# Patient Record
Sex: Female | Born: 1945 | Race: Black or African American | Hispanic: No | Smoking: Former smoker
Health system: Southern US, Community
[De-identification: ages and names within clinical notes are randomized; demographics above are authoritative.]

## PROBLEM LIST (undated history)

## (undated) DIAGNOSIS — Z9889 Other specified postprocedural states: Secondary | ICD-10-CM

## (undated) DIAGNOSIS — R112 Nausea with vomiting, unspecified: Secondary | ICD-10-CM

## (undated) DIAGNOSIS — K5792 Diverticulitis of intestine, part unspecified, without perforation or abscess without bleeding: Secondary | ICD-10-CM

## (undated) DIAGNOSIS — I82409 Acute embolism and thrombosis of unspecified deep veins of unspecified lower extremity: Secondary | ICD-10-CM

## (undated) DIAGNOSIS — J40 Bronchitis, not specified as acute or chronic: Secondary | ICD-10-CM

## (undated) DIAGNOSIS — I2699 Other pulmonary embolism without acute cor pulmonale: Secondary | ICD-10-CM

## (undated) DIAGNOSIS — J45909 Unspecified asthma, uncomplicated: Secondary | ICD-10-CM

## (undated) DIAGNOSIS — Z87442 Personal history of urinary calculi: Secondary | ICD-10-CM

## (undated) DIAGNOSIS — E119 Type 2 diabetes mellitus without complications: Secondary | ICD-10-CM

## (undated) DIAGNOSIS — I10 Essential (primary) hypertension: Secondary | ICD-10-CM

## (undated) DIAGNOSIS — R42 Dizziness and giddiness: Secondary | ICD-10-CM

## (undated) DIAGNOSIS — Z7901 Long term (current) use of anticoagulants: Secondary | ICD-10-CM

## (undated) DIAGNOSIS — I509 Heart failure, unspecified: Secondary | ICD-10-CM

## (undated) DIAGNOSIS — M1990 Unspecified osteoarthritis, unspecified site: Secondary | ICD-10-CM

## (undated) DIAGNOSIS — I951 Orthostatic hypotension: Secondary | ICD-10-CM

## (undated) DIAGNOSIS — K219 Gastro-esophageal reflux disease without esophagitis: Secondary | ICD-10-CM

## (undated) DIAGNOSIS — R791 Abnormal coagulation profile: Secondary | ICD-10-CM

## (undated) HISTORY — PX: ABDOMINAL HYSTERECTOMY: SHX81

## (undated) HISTORY — DX: Orthostatic hypotension: I95.1

## (undated) HISTORY — DX: Abnormal coagulation profile: R79.1

## (undated) HISTORY — DX: Other pulmonary embolism without acute cor pulmonale: I26.99

## (undated) HISTORY — DX: Acute embolism and thrombosis of unspecified deep veins of unspecified lower extremity: I82.409

## (undated) HISTORY — DX: Type 2 diabetes mellitus without complications: E11.9

## (undated) HISTORY — DX: Long term (current) use of anticoagulants: Z79.01

---

## 1997-10-03 ENCOUNTER — Emergency Department (HOSPITAL_COMMUNITY): Admission: EM | Admit: 1997-10-03 | Discharge: 1997-10-03 | Payer: Self-pay | Admitting: Emergency Medicine

## 2000-03-09 ENCOUNTER — Encounter: Payer: Self-pay | Admitting: Emergency Medicine

## 2000-03-09 ENCOUNTER — Inpatient Hospital Stay (HOSPITAL_COMMUNITY): Admission: EM | Admit: 2000-03-09 | Discharge: 2000-03-17 | Payer: Self-pay | Admitting: Emergency Medicine

## 2000-03-11 ENCOUNTER — Encounter: Payer: Self-pay | Admitting: Internal Medicine

## 2000-08-08 ENCOUNTER — Emergency Department (HOSPITAL_COMMUNITY): Admission: EM | Admit: 2000-08-08 | Discharge: 2000-08-08 | Payer: Self-pay | Admitting: Emergency Medicine

## 2000-08-08 ENCOUNTER — Encounter: Payer: Self-pay | Admitting: Emergency Medicine

## 2000-12-19 ENCOUNTER — Emergency Department (HOSPITAL_COMMUNITY): Admission: EM | Admit: 2000-12-19 | Discharge: 2000-12-20 | Payer: Self-pay | Admitting: Emergency Medicine

## 2000-12-19 ENCOUNTER — Encounter: Payer: Self-pay | Admitting: Emergency Medicine

## 2002-01-27 ENCOUNTER — Encounter: Payer: Self-pay | Admitting: Emergency Medicine

## 2002-01-27 ENCOUNTER — Emergency Department (HOSPITAL_COMMUNITY): Admission: EM | Admit: 2002-01-27 | Discharge: 2002-01-27 | Payer: Self-pay | Admitting: Emergency Medicine

## 2003-02-25 ENCOUNTER — Emergency Department (HOSPITAL_COMMUNITY): Admission: EM | Admit: 2003-02-25 | Discharge: 2003-02-26 | Payer: Self-pay | Admitting: Emergency Medicine

## 2003-04-16 ENCOUNTER — Other Ambulatory Visit: Admission: RE | Admit: 2003-04-16 | Discharge: 2003-04-16 | Payer: Self-pay | Admitting: Family Medicine

## 2003-04-28 ENCOUNTER — Ambulatory Visit (HOSPITAL_COMMUNITY): Admission: RE | Admit: 2003-04-28 | Discharge: 2003-04-28 | Payer: Self-pay | Admitting: Family Medicine

## 2003-05-03 ENCOUNTER — Emergency Department (HOSPITAL_COMMUNITY): Admission: EM | Admit: 2003-05-03 | Discharge: 2003-05-03 | Payer: Self-pay | Admitting: Emergency Medicine

## 2003-06-03 ENCOUNTER — Encounter: Admission: RE | Admit: 2003-06-03 | Discharge: 2003-06-03 | Payer: Self-pay | Admitting: Family Medicine

## 2003-06-05 ENCOUNTER — Encounter: Admission: RE | Admit: 2003-06-05 | Discharge: 2003-06-05 | Payer: Self-pay | Admitting: Family Medicine

## 2003-06-05 ENCOUNTER — Encounter (INDEPENDENT_AMBULATORY_CARE_PROVIDER_SITE_OTHER): Payer: Self-pay | Admitting: Specialist

## 2003-09-22 ENCOUNTER — Ambulatory Visit: Payer: Self-pay | Admitting: Family Medicine

## 2003-10-21 ENCOUNTER — Ambulatory Visit: Payer: Self-pay | Admitting: Internal Medicine

## 2003-11-19 ENCOUNTER — Ambulatory Visit: Payer: Self-pay | Admitting: Family Medicine

## 2003-12-22 ENCOUNTER — Ambulatory Visit: Payer: Self-pay | Admitting: Family Medicine

## 2003-12-24 ENCOUNTER — Ambulatory Visit: Payer: Self-pay | Admitting: Family Medicine

## 2004-01-19 ENCOUNTER — Ambulatory Visit: Payer: Self-pay | Admitting: Family Medicine

## 2004-02-16 ENCOUNTER — Ambulatory Visit: Payer: Self-pay | Admitting: Family Medicine

## 2004-03-16 ENCOUNTER — Ambulatory Visit: Payer: Self-pay | Admitting: Family Medicine

## 2004-04-13 ENCOUNTER — Ambulatory Visit: Payer: Self-pay | Admitting: Family Medicine

## 2004-04-21 ENCOUNTER — Ambulatory Visit: Payer: Self-pay | Admitting: Family Medicine

## 2004-05-13 ENCOUNTER — Ambulatory Visit: Payer: Self-pay | Admitting: Family Medicine

## 2004-06-14 ENCOUNTER — Ambulatory Visit: Payer: Self-pay | Admitting: Family Medicine

## 2004-07-07 ENCOUNTER — Encounter: Admission: RE | Admit: 2004-07-07 | Discharge: 2004-07-07 | Payer: Self-pay | Admitting: Family Medicine

## 2004-07-12 ENCOUNTER — Ambulatory Visit: Payer: Self-pay | Admitting: Family Medicine

## 2004-08-16 ENCOUNTER — Ambulatory Visit: Payer: Self-pay | Admitting: Family Medicine

## 2004-09-15 ENCOUNTER — Ambulatory Visit: Payer: Self-pay | Admitting: Family Medicine

## 2004-10-13 ENCOUNTER — Ambulatory Visit: Payer: Self-pay | Admitting: Family Medicine

## 2004-11-10 ENCOUNTER — Ambulatory Visit: Payer: Self-pay | Admitting: Family Medicine

## 2004-12-08 ENCOUNTER — Ambulatory Visit: Payer: Self-pay | Admitting: Family Medicine

## 2005-01-06 ENCOUNTER — Ambulatory Visit: Payer: Self-pay | Admitting: Family Medicine

## 2005-02-03 ENCOUNTER — Ambulatory Visit: Payer: Self-pay | Admitting: Internal Medicine

## 2005-03-08 ENCOUNTER — Ambulatory Visit: Payer: Self-pay | Admitting: Family Medicine

## 2005-03-12 ENCOUNTER — Ambulatory Visit: Payer: Self-pay | Admitting: Internal Medicine

## 2005-03-12 ENCOUNTER — Inpatient Hospital Stay (HOSPITAL_COMMUNITY): Admission: EM | Admit: 2005-03-12 | Discharge: 2005-03-15 | Payer: Self-pay | Admitting: Emergency Medicine

## 2005-03-14 ENCOUNTER — Encounter: Payer: Self-pay | Admitting: Cardiology

## 2005-04-05 ENCOUNTER — Ambulatory Visit: Payer: Self-pay | Admitting: Family Medicine

## 2005-04-06 ENCOUNTER — Ambulatory Visit: Payer: Self-pay | Admitting: Family Medicine

## 2005-05-09 ENCOUNTER — Ambulatory Visit: Payer: Self-pay | Admitting: Nurse Practitioner

## 2005-05-30 ENCOUNTER — Ambulatory Visit: Payer: Self-pay | Admitting: Family Medicine

## 2005-06-29 ENCOUNTER — Ambulatory Visit: Payer: Self-pay | Admitting: Internal Medicine

## 2005-07-27 ENCOUNTER — Ambulatory Visit: Payer: Self-pay | Admitting: Internal Medicine

## 2005-08-08 ENCOUNTER — Ambulatory Visit: Payer: Self-pay | Admitting: Family Medicine

## 2005-09-01 ENCOUNTER — Ambulatory Visit: Payer: Self-pay | Admitting: Internal Medicine

## 2005-09-05 ENCOUNTER — Encounter: Admission: RE | Admit: 2005-09-05 | Discharge: 2005-09-05 | Payer: Self-pay | Admitting: Family Medicine

## 2005-09-14 ENCOUNTER — Ambulatory Visit: Payer: Self-pay | Admitting: Internal Medicine

## 2005-09-28 ENCOUNTER — Ambulatory Visit: Payer: Self-pay | Admitting: Family Medicine

## 2005-10-06 ENCOUNTER — Ambulatory Visit: Payer: Self-pay | Admitting: Family Medicine

## 2005-10-31 ENCOUNTER — Ambulatory Visit: Payer: Self-pay | Admitting: Family Medicine

## 2005-11-17 ENCOUNTER — Ambulatory Visit: Payer: Self-pay | Admitting: Family Medicine

## 2005-11-28 ENCOUNTER — Ambulatory Visit: Payer: Self-pay | Admitting: Family Medicine

## 2005-12-26 ENCOUNTER — Ambulatory Visit: Payer: Self-pay | Admitting: Family Medicine

## 2006-01-24 ENCOUNTER — Ambulatory Visit: Payer: Self-pay | Admitting: Family Medicine

## 2006-02-01 ENCOUNTER — Ambulatory Visit: Payer: Self-pay | Admitting: Family Medicine

## 2006-03-08 ENCOUNTER — Ambulatory Visit: Payer: Self-pay | Admitting: Family Medicine

## 2006-03-28 ENCOUNTER — Ambulatory Visit: Payer: Self-pay | Admitting: Family Medicine

## 2006-05-04 ENCOUNTER — Other Ambulatory Visit: Admission: RE | Admit: 2006-05-04 | Discharge: 2006-05-04 | Payer: Self-pay | Admitting: Family Medicine

## 2006-05-04 ENCOUNTER — Ambulatory Visit: Payer: Self-pay | Admitting: Family Medicine

## 2006-05-04 ENCOUNTER — Encounter (INDEPENDENT_AMBULATORY_CARE_PROVIDER_SITE_OTHER): Payer: Self-pay | Admitting: Family Medicine

## 2006-05-04 LAB — CONVERTED CEMR LAB: Pap Smear: NORMAL

## 2006-06-10 ENCOUNTER — Encounter (INDEPENDENT_AMBULATORY_CARE_PROVIDER_SITE_OTHER): Payer: Self-pay | Admitting: Family Medicine

## 2006-06-10 DIAGNOSIS — I8390 Asymptomatic varicose veins of unspecified lower extremity: Secondary | ICD-10-CM | POA: Insufficient documentation

## 2006-06-10 DIAGNOSIS — I10 Essential (primary) hypertension: Secondary | ICD-10-CM

## 2006-06-10 DIAGNOSIS — Z86718 Personal history of other venous thrombosis and embolism: Secondary | ICD-10-CM | POA: Insufficient documentation

## 2006-06-10 DIAGNOSIS — E785 Hyperlipidemia, unspecified: Secondary | ICD-10-CM

## 2006-06-10 DIAGNOSIS — J4540 Moderate persistent asthma, uncomplicated: Secondary | ICD-10-CM | POA: Insufficient documentation

## 2006-06-21 ENCOUNTER — Inpatient Hospital Stay (HOSPITAL_COMMUNITY): Admission: EM | Admit: 2006-06-21 | Discharge: 2006-06-23 | Payer: Self-pay | Admitting: Family Medicine

## 2006-06-21 DIAGNOSIS — J309 Allergic rhinitis, unspecified: Secondary | ICD-10-CM | POA: Insufficient documentation

## 2006-06-22 ENCOUNTER — Ambulatory Visit: Payer: Self-pay | Admitting: Vascular Surgery

## 2006-06-22 ENCOUNTER — Encounter (INDEPENDENT_AMBULATORY_CARE_PROVIDER_SITE_OTHER): Payer: Self-pay | Admitting: Internal Medicine

## 2006-06-26 ENCOUNTER — Ambulatory Visit (HOSPITAL_COMMUNITY): Admission: RE | Admit: 2006-06-26 | Discharge: 2006-06-26 | Payer: Self-pay | Admitting: Internal Medicine

## 2006-06-27 ENCOUNTER — Inpatient Hospital Stay (HOSPITAL_COMMUNITY): Admission: EM | Admit: 2006-06-27 | Discharge: 2006-06-29 | Payer: Self-pay | Admitting: Emergency Medicine

## 2006-07-04 ENCOUNTER — Ambulatory Visit: Payer: Self-pay | Admitting: Family Medicine

## 2006-07-07 ENCOUNTER — Ambulatory Visit: Payer: Self-pay | Admitting: Family Medicine

## 2006-07-17 ENCOUNTER — Ambulatory Visit: Payer: Self-pay | Admitting: Family Medicine

## 2006-07-21 ENCOUNTER — Emergency Department (HOSPITAL_COMMUNITY): Admission: EM | Admit: 2006-07-21 | Discharge: 2006-07-21 | Payer: Self-pay | Admitting: Emergency Medicine

## 2006-08-07 ENCOUNTER — Ambulatory Visit: Payer: Self-pay | Admitting: Family Medicine

## 2006-08-17 ENCOUNTER — Ambulatory Visit: Payer: Self-pay | Admitting: Family Medicine

## 2006-08-22 ENCOUNTER — Emergency Department (HOSPITAL_COMMUNITY): Admission: EM | Admit: 2006-08-22 | Discharge: 2006-08-22 | Payer: Self-pay | Admitting: Emergency Medicine

## 2006-08-23 ENCOUNTER — Ambulatory Visit: Payer: Self-pay | Admitting: Family Medicine

## 2006-09-06 ENCOUNTER — Ambulatory Visit: Payer: Self-pay | Admitting: Family Medicine

## 2006-10-03 ENCOUNTER — Ambulatory Visit: Payer: Self-pay | Admitting: Family Medicine

## 2006-10-10 ENCOUNTER — Encounter: Admission: RE | Admit: 2006-10-10 | Discharge: 2006-10-10 | Payer: Self-pay | Admitting: Family Medicine

## 2006-10-31 ENCOUNTER — Ambulatory Visit: Payer: Self-pay | Admitting: Family Medicine

## 2006-11-14 ENCOUNTER — Ambulatory Visit: Payer: Self-pay | Admitting: Family Medicine

## 2006-12-12 ENCOUNTER — Ambulatory Visit: Payer: Self-pay | Admitting: Family Medicine

## 2006-12-22 ENCOUNTER — Emergency Department (HOSPITAL_COMMUNITY): Admission: EM | Admit: 2006-12-22 | Discharge: 2006-12-22 | Payer: Self-pay | Admitting: Emergency Medicine

## 2006-12-25 ENCOUNTER — Ambulatory Visit: Payer: Self-pay | Admitting: Family Medicine

## 2007-01-09 ENCOUNTER — Ambulatory Visit: Payer: Self-pay | Admitting: Family Medicine

## 2007-02-06 ENCOUNTER — Ambulatory Visit: Payer: Self-pay | Admitting: Family Medicine

## 2007-03-07 ENCOUNTER — Ambulatory Visit: Payer: Self-pay | Admitting: Family Medicine

## 2007-03-22 ENCOUNTER — Ambulatory Visit: Payer: Self-pay | Admitting: Family Medicine

## 2007-03-31 ENCOUNTER — Emergency Department (HOSPITAL_COMMUNITY): Admission: EM | Admit: 2007-03-31 | Discharge: 2007-04-01 | Payer: Self-pay | Admitting: Emergency Medicine

## 2007-04-18 ENCOUNTER — Ambulatory Visit: Payer: Self-pay | Admitting: Family Medicine

## 2007-05-16 ENCOUNTER — Ambulatory Visit: Payer: Self-pay | Admitting: Family Medicine

## 2007-05-28 ENCOUNTER — Ambulatory Visit: Payer: Self-pay | Admitting: Internal Medicine

## 2007-06-11 ENCOUNTER — Ambulatory Visit: Payer: Self-pay | Admitting: Internal Medicine

## 2007-06-11 ENCOUNTER — Encounter (INDEPENDENT_AMBULATORY_CARE_PROVIDER_SITE_OTHER): Payer: Self-pay | Admitting: Family Medicine

## 2007-06-11 LAB — CONVERTED CEMR LAB
ALT: 23 units/L (ref 0–35)
BUN: 16 mg/dL (ref 6–23)
CO2: 28 meq/L (ref 19–32)
Calcium: 9.7 mg/dL (ref 8.4–10.5)
Chloride: 106 meq/L (ref 96–112)
Glucose, Bld: 112 mg/dL — ABNORMAL HIGH (ref 70–99)
HDL: 43 mg/dL (ref >39)
LDL Cholesterol: 133 mg/dL — ABNORMAL HIGH (ref 0–99)
Sodium: 145 meq/L (ref 135–145)
TSH: 0.926 microintl units/mL (ref 0.350–5.50)
Total Protein: 7.4 g/dL (ref 6.0–8.3)
VLDL: 30 mg/dL (ref 0–40)
Vit D, 1,25-Dihydroxy: 16 — ABNORMAL LOW (ref 30–89)

## 2007-07-10 ENCOUNTER — Ambulatory Visit: Payer: Self-pay | Admitting: Family Medicine

## 2007-08-07 ENCOUNTER — Encounter (INDEPENDENT_AMBULATORY_CARE_PROVIDER_SITE_OTHER): Payer: Self-pay | Admitting: Family Medicine

## 2007-08-07 ENCOUNTER — Ambulatory Visit: Payer: Self-pay | Admitting: Internal Medicine

## 2007-08-07 LAB — CONVERTED CEMR LAB
INR: 2.3 — ABNORMAL HIGH (ref 0.0–1.5)
Prothrombin Time: 26.4 s — ABNORMAL HIGH (ref 11.6–15.2)

## 2007-09-05 ENCOUNTER — Ambulatory Visit: Payer: Self-pay | Admitting: Family Medicine

## 2007-09-05 LAB — CONVERTED CEMR LAB
BUN: 19 mg/dL (ref 6–23)
Calcium: 9 mg/dL (ref 8.4–10.5)
Chloride: 106 meq/L (ref 96–112)
Creatinine, Ser: 0.88 mg/dL (ref 0.40–1.20)
Glucose, Bld: 113 mg/dL — ABNORMAL HIGH (ref 70–99)
Potassium: 3.4 meq/L — ABNORMAL LOW (ref 3.5–5.3)
Vit D, 1,25-Dihydroxy: 33 (ref 30–89)

## 2007-10-03 ENCOUNTER — Ambulatory Visit: Payer: Self-pay | Admitting: Family Medicine

## 2007-10-03 LAB — CONVERTED CEMR LAB: Prothrombin Time: 23.8 s — ABNORMAL HIGH (ref 11.6–15.2)

## 2007-10-31 ENCOUNTER — Ambulatory Visit: Payer: Self-pay | Admitting: Family Medicine

## 2007-11-27 ENCOUNTER — Ambulatory Visit: Payer: Self-pay | Admitting: Family Medicine

## 2007-11-27 LAB — CONVERTED CEMR LAB: INR: 1.9 — ABNORMAL HIGH (ref 0.0–1.5)

## 2007-12-18 ENCOUNTER — Ambulatory Visit: Payer: Self-pay | Admitting: Internal Medicine

## 2007-12-18 ENCOUNTER — Ambulatory Visit: Payer: Self-pay | Admitting: Family Medicine

## 2007-12-18 LAB — CONVERTED CEMR LAB: INR: 1.9 — ABNORMAL HIGH (ref 0.0–1.5)

## 2008-01-15 ENCOUNTER — Ambulatory Visit: Payer: Self-pay | Admitting: Family Medicine

## 2008-02-12 ENCOUNTER — Ambulatory Visit: Payer: Self-pay | Admitting: Family Medicine

## 2008-03-21 ENCOUNTER — Ambulatory Visit: Payer: Self-pay | Admitting: Family Medicine

## 2008-04-01 ENCOUNTER — Ambulatory Visit (HOSPITAL_COMMUNITY): Admission: RE | Admit: 2008-04-01 | Discharge: 2008-04-01 | Payer: Self-pay | Admitting: Family Medicine

## 2008-04-22 ENCOUNTER — Ambulatory Visit: Payer: Self-pay | Admitting: Family Medicine

## 2008-05-20 ENCOUNTER — Ambulatory Visit: Payer: Self-pay | Admitting: Family Medicine

## 2008-06-16 ENCOUNTER — Ambulatory Visit: Payer: Self-pay | Admitting: Family Medicine

## 2008-06-16 LAB — CONVERTED CEMR LAB
ALT: 23 units/L (ref 0–35)
AST: 19 units/L (ref 0–37)
Albumin: 3.8 g/dL (ref 3.5–5.2)
Alkaline Phosphatase: 76 units/L (ref 39–117)
CO2: 26 meq/L (ref 19–32)
Calcium: 9.3 mg/dL (ref 8.4–10.5)
Cholesterol: 198 mg/dL (ref 0–200)
Creatinine, Ser: 0.72 mg/dL (ref 0.40–1.20)
Eosinophils Relative: 3 % (ref 0–5)
HCT: 39.9 % (ref 36.0–46.0)
Lymphocytes Relative: 38 % (ref 12–46)
Lymphs Abs: 2.3 10*3/uL (ref 0.7–4.0)
MCHC: 29.3 g/dL — ABNORMAL LOW (ref 30.0–36.0)
Monocytes Absolute: 0.5 10*3/uL (ref 0.1–1.0)
Monocytes Relative: 9 % (ref 3–12)
Neutro Abs: 3 10*3/uL (ref 1.7–7.7)
Platelets: 198 10*3/uL (ref 150–400)
RDW: 15.6 % — ABNORMAL HIGH (ref 11.5–15.5)
Total Bilirubin: 0.2 mg/dL — ABNORMAL LOW (ref 0.3–1.2)
Total Protein: 7.2 g/dL (ref 6.0–8.3)
Triglycerides: 147 mg/dL (ref <150)

## 2008-07-01 ENCOUNTER — Ambulatory Visit: Payer: Self-pay | Admitting: Family Medicine

## 2008-07-15 ENCOUNTER — Ambulatory Visit: Payer: Self-pay | Admitting: Family Medicine

## 2008-07-31 ENCOUNTER — Ambulatory Visit: Payer: Self-pay | Admitting: Family Medicine

## 2008-07-31 ENCOUNTER — Ambulatory Visit: Payer: Self-pay | Admitting: Internal Medicine

## 2008-09-01 ENCOUNTER — Ambulatory Visit: Payer: Self-pay | Admitting: Family Medicine

## 2008-09-26 ENCOUNTER — Telehealth (INDEPENDENT_AMBULATORY_CARE_PROVIDER_SITE_OTHER): Payer: Self-pay | Admitting: *Deleted

## 2008-09-26 ENCOUNTER — Ambulatory Visit: Payer: Self-pay | Admitting: Family Medicine

## 2008-09-29 ENCOUNTER — Ambulatory Visit: Payer: Self-pay | Admitting: Family Medicine

## 2008-10-06 ENCOUNTER — Ambulatory Visit: Payer: Self-pay | Admitting: Family Medicine

## 2008-10-28 ENCOUNTER — Ambulatory Visit: Payer: Self-pay | Admitting: Family Medicine

## 2008-11-25 ENCOUNTER — Ambulatory Visit: Payer: Self-pay | Admitting: Family Medicine

## 2008-12-08 ENCOUNTER — Ambulatory Visit: Payer: Self-pay | Admitting: Internal Medicine

## 2008-12-23 ENCOUNTER — Ambulatory Visit: Payer: Self-pay | Admitting: Family Medicine

## 2009-01-28 ENCOUNTER — Ambulatory Visit: Payer: Self-pay | Admitting: Family Medicine

## 2009-01-28 LAB — CONVERTED CEMR LAB
Calcium: 9.1 mg/dL (ref 8.4–10.5)
Creatinine, Ser: 0.79 mg/dL (ref 0.40–1.20)
Glucose, Bld: 94 mg/dL (ref 70–99)
HDL: 47 mg/dL (ref >39)
LDL Cholesterol: 110 mg/dL — ABNORMAL HIGH (ref 0–99)
Total CHOL/HDL Ratio: 3.9
Triglycerides: 133 mg/dL (ref <150)
VLDL: 27 mg/dL (ref 0–40)

## 2009-02-13 ENCOUNTER — Ambulatory Visit: Payer: Self-pay | Admitting: Family Medicine

## 2009-03-03 ENCOUNTER — Ambulatory Visit: Payer: Self-pay | Admitting: Family Medicine

## 2009-03-09 ENCOUNTER — Ambulatory Visit: Payer: Self-pay | Admitting: Family Medicine

## 2009-03-17 ENCOUNTER — Ambulatory Visit: Payer: Self-pay | Admitting: Family Medicine

## 2009-03-22 ENCOUNTER — Emergency Department (HOSPITAL_COMMUNITY): Admission: EM | Admit: 2009-03-22 | Discharge: 2009-03-22 | Payer: Self-pay | Admitting: Family Medicine

## 2009-03-24 ENCOUNTER — Ambulatory Visit: Payer: Self-pay | Admitting: Family Medicine

## 2009-04-14 ENCOUNTER — Ambulatory Visit: Payer: Self-pay | Admitting: Family Medicine

## 2009-06-09 ENCOUNTER — Ambulatory Visit: Payer: Self-pay | Admitting: Family Medicine

## 2009-07-07 ENCOUNTER — Ambulatory Visit: Payer: Self-pay | Admitting: Family Medicine

## 2009-08-04 ENCOUNTER — Ambulatory Visit: Payer: Self-pay | Admitting: Family Medicine

## 2009-09-01 ENCOUNTER — Ambulatory Visit: Payer: Self-pay | Admitting: Family Medicine

## 2009-09-29 ENCOUNTER — Encounter (INDEPENDENT_AMBULATORY_CARE_PROVIDER_SITE_OTHER): Payer: Self-pay | Admitting: Family Medicine

## 2009-10-27 ENCOUNTER — Encounter (INDEPENDENT_AMBULATORY_CARE_PROVIDER_SITE_OTHER): Payer: Self-pay | Admitting: Family Medicine

## 2009-10-27 LAB — CONVERTED CEMR LAB
INR: 2.52 — ABNORMAL HIGH (ref <1.50)
Prothrombin Time: 27.3 s — ABNORMAL HIGH (ref 11.6–15.2)

## 2009-12-22 ENCOUNTER — Encounter (INDEPENDENT_AMBULATORY_CARE_PROVIDER_SITE_OTHER): Payer: Self-pay | Admitting: Family Medicine

## 2009-12-22 LAB — CONVERTED CEMR LAB: Prothrombin Time: 24.1 s — ABNORMAL HIGH (ref 11.6–15.2)

## 2010-03-05 ENCOUNTER — Inpatient Hospital Stay (INDEPENDENT_AMBULATORY_CARE_PROVIDER_SITE_OTHER)
Admission: RE | Admit: 2010-03-05 | Discharge: 2010-03-05 | Disposition: A | Discharge status: Home / Self Care | Payer: Medicaid Other | Source: Ambulatory Visit | Attending: Emergency Medicine | Admitting: Emergency Medicine

## 2010-03-05 DIAGNOSIS — I10 Essential (primary) hypertension: Secondary | ICD-10-CM

## 2010-03-05 DIAGNOSIS — J019 Acute sinusitis, unspecified: Secondary | ICD-10-CM

## 2010-03-30 ENCOUNTER — Ambulatory Visit (HOSPITAL_COMMUNITY): Payer: Medicaid Other

## 2010-05-25 NOTE — H&P (Signed)
NAMEJHOSELIN, CRUME                 ACCOUNT NO.:  [id number]   MEDICAL RECORD NO.:  [id number]          PATIENT TYPE:  INP   LOCATION:  1823                         FACILITY:  [facility]   PHYSICIAN:  Lonia Blood, M.D.DATE OF BIRTH:  1945/05/09   DATE OF ADMISSION:  06/26/2006  DATE OF DISCHARGE:                              HISTORY & PHYSICAL   CHIEF COMPLAINT:  Thrombosis of the greater saphenous vein.   HISTORY OF PRESENT ILLNESS:  This is a readmission note for Ms. Earlie Counts.  She is a 65 year old female who was admitted to the hospital at  [facility] on June 21, 2006, with a diagnosis of right lower extremity  thrombophlebitis.  Doppler evaluation at that time revealed a small,  superficial thrombus in the area of the greater saphenous vein.  Ultimately, the decision was made not to continue with full dose  anticoagulation at discharge and the patient was treated with NSAIDs and  Keflex therapy.  Upon discharge, she was instructed to follow up on June 26, 2006, to have a repeat Doppler of her right lower extremity.  She  reports that in the interim, her leg has continued to hurt and has  remained erythematous without significant improvement.  She therefore  presented yesterday for her Doppler evaluation which revealed  progression of her saphenous vein DVT in its proximal location extending  from basically the pelvis down to the knee.  She was advised to report  emergency room and did so.  She denies chest pain, shortness of breath,  fever, chills, nausea or vomiting.  She has otherwise been medically  stable since her discharge, but does continue to complain of pain in the  lower extremity with erythema.   REVIEW OF SYSTEMS:  Comprehensive review of systems has been  unremarkable with the exception of the elements noted in the history of  present illness above.   PAST MEDICAL HISTORY:  Please see dictated history and physical from  June 21, 2006.   MEDICATIONS:  1.  Albuterol MDI.  2. Advair.  3. Hydrochlorothiazide 25 mg daily.  4. Singulair 10 mg daily.  5. Pravachol 40 mg daily.  6. Keflex 500 mg t.i.d.   ALLERGIES:  SHELLFISH AND PENICILLIN.   FAMILY HISTORY:  The patient's mother died of lung cancer.  The  patient's father has hypertension.   SOCIAL HISTORY:  The patient does not drink or smoke.  She has five  children.   LABORATORY DATA AND X-RAY FINDINGS:  Hemoglobin is stable at 10.8 with  previous level of 10.7, white count and platelet count.  CMET is  unremarkable with the exception of mildly low potassium at 3.5.  Coagulations are normal.   PHYSICAL EXAMINATION:  VITAL SIGNS:  Temperature 97.7, blood pressure  138/70, heart rate 90, respirations 18, saturations 95% on room air.  GENERAL:  Morbidly obese female in no acute respiratory distress.  LUNGS:  Clear to auscultation bilaterally without wheezes or rhonchi.  CARDIAC:  Regular rate and rhythm without murmurs, rubs or gallops.  Normal S1, S2.  ABDOMEN:  Morbidly obese, soft  bowel sounds present, no  hepatosplenomegaly and no rebound.  EXTREMITIES:  Marked edema of bilateral lower extremities.  Multiple  redundant folds of skin.  The patient's right lower extremity is  enlarged compared to the left.  She has significant streaking erythema  of the right lateral thigh and right lateral lower extremity.  NEUROLOGIC:  Cranial nerves 2-12 grossly intact, 5/5 strength in  bilateral upper and lower extremities.  Intact touch throughout.   IMPRESSION/PLAN:  Ms. Radu is being readmitted with the diagnosis of  proximal greater saphenous vein thrombosis.  Given her morbid obesity  and her prior history of pulmonary embolism, it seems prudent at this  time to commit the patient to full dose anticoagulation therapy due to  the propensity for proximal saphenous vein thromboses to progress to  full blown deep venous thrombosis.  It is likely that the patient will  need to remain on  anticoagulation for the remainder of her life as she  will be an exceedingly high risk for recurrent thrombosis.  I have  discussed this with the patient.  Will initiate Lovenox therapy.  Will  also initiate Coumadin.  At such time her that the patient's Coumadin is  therapeutic, her Lovenox will be discontinued and she will be discharged  home.      Lonia Blood, M.D.  Electronically Signed     JTM/MEDQ  D:  06/27/2006  T:  06/27/2006  Job:  [id number]   cc:   Dala Dock

## 2010-05-25 NOTE — Discharge Summary (Signed)
Caroline [name], [name]                 ACCOUNT NO.:  [id number]   MEDICAL RECORD NO.:  [id number]          PATIENT TYPE:  INP   LOCATION:  5735                         FACILITY:  [facility]   PHYSICIAN:  Ladell Pier, M.D.   DATE OF BIRTH:  [birth year]   DATE OF ADMISSION:  06/21/2006  DATE OF DISCHARGE:  06/23/2006                               DISCHARGE SUMMARY   DISCHARGE DIAGNOSIS:  1. 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 Hypertension.  3. Pulmonary embolism in 2002.  4. Bronchial asthma.  5. Dyslipidemia.  6. Status post hysterectomy.   PROCEDURES:  None.   CONSULTANTS:  None.   FOLLOWUP APPOINTMENTS:  The patient to follow up with Dr. Audria [name] at  [facility] next week.  The patient also set up for a Doppler ultrasound of the right lower  extremity on Monday, June 16th at 9 a.m., phone number [phone], copy  will be sent to Dr. Audria [name].   HISTORY OF PRESENT ILLNESS:  The patient stated that she was stung by a  wasp on her right arm 3 days ago.  She went her primary care physician,  was given Benadryl to prevent an acute flare up of her asthma.  She  subsequently noticed redness in her lower extremity and throbbing.  She  had a low grade fever and presented to the emergency room.   PAST MEDICAL HISTORY:  Per admission H&P.   FAMILY HISTORY:  Per admission H&P.   SOCIAL HISTORY:  Per admission H&P.   MEDICATIONS:  Per admission H&P.   ALLERGIES:  Per admission H&P.   REVIEW OF SYSTEMS:  Per admission H&P.   PHYSICAL EXAMINATION ON DISCHARGE:  VITAL SIGNS:  Temperature 98.7,  pulse of 90, respirations 20, blood pressure 110/70, pulse ox 98% on  room air.  HEENT:  Normocephalic, atraumatic.  Pupils reactive to light.  Throat  without erythema.  CARDIOVASCULAR:  Regular rate and rhythm.  LUNGS:  Clear bilaterally.  ABDOMEN:  Positive bowel sounds.  EXTREMITIES:  Trace edema.  She does  have a thrombosed cords, palpable  cords all the way down her right lower extremity.   HOSPITAL COURSE:  1. Thrombophlebitis:  The patient was admitted to the hospital.  She      had a Doppler ultrasound done that did not show any deep venous      thrombosis.  She has greater saphenous vein thrombosis.      Thrombophlebitis with some erythema question secondary to      cellulitis.  The patient was treated with Lovenox, a treatment      dose, and transitioned over to DVT prophylactic dose.  She was      treated for thrombophlebitis with Celebrex and warm compresses with      the leg elevated.  It was explained to the patient that sometimes      superficial venous thrombosis can progress to deep vein thrombosis.      She is scheduled for another repeat ultrasound on Monday,  June      16th, which we will send a copy to Dr. Audria [name].  The patient will      follow up next day week with Dr. Audria [name].  It may be that she may      need to be on chronic Coumadin therapy with her history of PE and      her recent discontinuation of Coumadin.  She states it was      discontinued by her primary care physician and now with her having      venous thrombosis.  The patient will discuss that with primary care      physician.  2. Hypertension:  She was continued on her medications.   DISCHARGE LABORATORY:  Blood cultures negative x2.  Sodium 142,  potassium 3.5, chloride 104, CO2 30, BUN 11, creatinine 0.89, glucose  121.  WBC 9.8, hemoglobin 10.7, platelet 194.  D-dimer 9.05.  Lipid  profile total cholesterol 171, LDL 110, HDL 40, LDL 109.      Ladell Pier, M.D.  Electronically Signed     NJ/MEDQ  D:  06/23/2006  T:  06/23/2006  Job:  [id number]   cc:   Caroline [name], M.D.

## 2010-05-25 NOTE — Discharge Summary (Signed)
NAMESHACARA, COZINE                 ACCOUNT NO.:  [id number]   MEDICAL RECORD NO.:  [id number]           PATIENT TYPE:   LOCATION:                                 FACILITY:   PHYSICIAN:  Hettie Holstein, D.O.         DATE OF BIRTH:   DATE OF ADMISSION:  06/27/2006  DATE OF DISCHARGE:  06/29/2006                               DISCHARGE SUMMARY   PRIMARY CARE PHYSICIAN:  [facility].   FINAL DIAGNOSES:  1. Right lower extremity deep vein thrombosis.  2. Morbid obesity.  3. Recent ALLERGIC REACTION TO A BEE STING.   MEDICATIONS ON DISCHARGE:  1. She will be placed on a Lovenox bridge to a therapeutic INR at 120      mg subcu q.8 a.m. and p.8 p.m. until her INR is between 2 and 3.      She is provided a 10 day supply of Lovenox to achieve this goal.  2. Coumadin 2.5 mg tablets, two tablets daily until therapeutic INR      achieved.  She has received two dosages at 7.5 mg each with no rise      in her PT, INR.  According to her, however, she had maintained a      stable course for many years on 5 mg per day and we will reinitiate      this dose and have her obtain a PT, INR on Monday with adjustments      to take place at that time.  Home Health Care has been requested to      assist in Lovenox administration at home.  She is provided my      contact information if continuity issues arise.  We have done our      best to arrange solid followup, involving Case Management and      [facility].  She can resume her albuterol inhaler as before      as well as Advair, hydrochlorothiazide she can resume at 25 mg      daily, Singulair 10 mg daily, Pravachol 40 mg daily.  Keflex she      can also continue 500 mg p.o. b.i.d. until this prescription is      exhausted.  For pain and discomfort, she is provided Vicodin 5/500      p.o. q.6h. on a p.r.n. basis.   LABORATORY DATA:  Her INR at time of discharge was 1.1 after only two  doses of 7.5 mg.   HISTORY OF PRESENTING  ILLNESS:  For full details, please refer to the  H&P as dictated by Dr. Jetty Duhamel.  However, briefly, Ms. Skoda is  a 65 year old female admitted to the hospital with a right lower  extremity thrombophlebitis.  Doppler evaluation at that time revealed a  small superficial thrombus in the area of the greater saphenous vein.  She was discharged and treated with nonsteroidals and Keflex.  Upon  discharge she was instructed to followup on June 16 to have a repeat  Doppler of her right lower extremity.  She reports that in the interim  her leg continued to hurt and remained erythematous without significant  improvement.  She had prior history of deep venous thrombosis and had a  recent bee sting on her leg and was recently discontinued on her  anticoagulant therapy.   HOSPITAL COURSE:  As noted above, due to Ms. Chasteen' weight, large doses  of Lovenox had to be employed and Pharmacy was involved with dosing of  Lovenox bridged with therapeutic INR.  Home health care arrangements  have been coordinated in continuity and followup with [facility] and  [facility] have been established.  At this juncture we are going  to repeat a PT, INR on Monday to have followup by [facility] with a  backup in place if the continuity fails.   DISPOSITION:  Ms. Lora is medically stable.   CONDITION:  Follow up with [facility] following discharge.      Hettie Holstein, D.O.  Electronically Signed     ESS/MEDQ  D:  06/29/2006  T:  06/29/2006  Job:  [id number]   cc:   Maurice March, M.D.

## 2010-05-25 NOTE — H&P (Signed)
Caroline [name], SAVANNAH                 ACCOUNT NO.:  [id number]   MEDICAL RECORD NO.:  [id number]          PATIENT TYPE:  INP   LOCATION:  5735                         FACILITY:  [facility]   PHYSICIAN:  Herbie Saxon, MDDATE OF BIRTH:  01-12-1945   DATE OF ADMISSION:  06/21/2006  DATE OF DISCHARGE:                              HISTORY & PHYSICAL   PRESENTING COMPLAINT:  The patient was stung by a wasp in the right arm  3 days prior to presentation.  She had reported to [facility] primary  care physician for a Benadryl injection to prevent having an acute  asthma attack.  She subsequently noticed redness and swelling of her  right lower leg with 8/10 throbbing pain.  Today she noticed low-grade  fever prompting her to report to the emergency room.  She also noticed  redness and streaking from her lower leg to her mid-calf and thigh.   PAST MEDICAL HISTORY:  1. Hypertension, 10 years.  2. Pulmonary embolism 2002.  3. Bronchial asthma.  4. Hypercholesterolemia.   PAST SURGICAL HISTORY:  Hysterectomy 20 years ago.   FAMILY HISTORY:  Mother had cancer of the lungs.  Father had  hypertension.  Brother and sister have diabetes and hypertension.  No  history of alcohol, tobacco, or drug abuse.  She is divorced.  She has 5  children.   ALLERGIES:  PENICILLIN.   MEDICATIONS:  1. Albuterol inhaler.  2. Advair inhaler.  3. HCTZ 25 mg daily.  4. Singulair 10 mg daily.  5. Coumadin, which she is presently off since February 2008 per      primary care physician.  6. Pravachol 40 mg daily.   REVIEW OF SYSTEMS:  Twelve systems reviewed.  Pertinent positives and  negatives as above.   PHYSICAL EXAMINATION:  GENERAL:  She is an elderly lady, in no acute  distress.  VITAL SIGNS:  Temperature is 98, pulse is 99, respirations 20, blood  pressure 109/77.  HEENT:  Pupils equal and reactive to light and accommodation.  Head is  atraumatic and normocephalic.  Oropharynx and nasopharynx are  clear.  NECK:  Supple.  Mucus membranes are moist.  No lymphadenopathy.  No  carotid bruits or thyromegaly.  CHEST:  Clinically clear.  CARDIOVASCULAR:  Heart sounds 1 and 2 regular.  ABDOMEN:  Benign.  Truncal obesity.  EXTREMITIES:  Inguinal regions are intact.  She has plus varicose veins  bilaterally.  There is erythema in the right lower leg and tenderness.  The leg is also warm to touch.  Peripheral pulses reduced.  NEURO:  She is alert and oriented times 3.  Cranial nerves I-XII intact.  Power is 5/5 in all limbs.   AVAILABLE LABS:  ESR is 51.  D-dimer 9.  WBC is 12, hematocrit 36,  platelet count is 214,000.   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  The patient  is to be admitted to the medical floor and started on IV vancomycin and  Avelox.  Her  diet will be heart-healthy.  Will check venous Doppler of  good leg and the patient will be put on vancomycin on p.o. Keflex.  Discontinue the Avelox.  Will put on Protonix 40 mg p.o. daily, Lovenox  30 mg subcutaneous every day, albuterol and Atrovent nebs every four  p.r.n., O2 two liters nasal cannula and keep the pulse ox greater than  90.  Will continue her home medications.  Will manage the white count.  She is a full code.      Herbie Saxon, MD  Electronically Signed     MIO/MEDQ  D:  06/21/2006  T:  06/22/2006  Job:  [id number]   cc:   Janelle Floor, DO

## 2010-05-28 NOTE — Consult Note (Signed)
Caroline [name], Caroline [name]                 ACCOUNT NO.:  [id number]   MEDICAL RECORD NO.:  [id number]          PATIENT TYPE:  INP   LOCATION:  0103                         FACILITY:  [facility]   PHYSICIAN:  Doylene Canning. Ladona Ridgel, M.D.  DATE OF BIRTH:  1945/02/15   DATE OF CONSULTATION:  03/12/2005  DATE OF DISCHARGE:                                   CONSULTATION   REQUESTING PHYSICIAN:  Kela Millin, M.D.   INDICATIONS FOR CONSULTATION:  Evaluation of chest pain.   HISTORY OF PRESENT ILLNESS:  The patient is a 65 year old woman with a  history of hypertension, dyslipidemia, and a history of pulmonary embolism  in the past, who is admitted to the hospital with less than a one day  history of chest pain.  The patient states that she was with her  granddaughter at [employer] last night and awakened earlier this  morning with substernal chest pain without clear radiation, associated with  shortness of breath and nausea.  She was subsequently brought to the  emergency room.  She was treated with nitrates and heparin, and her symptoms  improved.  The patient was noted to be hypokalemic and had her potassium  replaced.  She was admitted for additional evaluation.  The patient notes no  syncope.  She denies vomiting, hemoptysis, or diaphoresis.  Her shortness of  breath is now resolved.   PAST MEDICAL HISTORY:  Notable for hypertension, on HCTZ.  She has a history  of dyslipidemia, on Pravachol.  She has a history of pulmonary embolism, on  chronic Coumadin therapy with pulmonary embolus diagnosed in 2002.  She has  a history of asthma and bronchitis.   FAMILY HISTORY:  Notable for no coronary disease.   SOCIAL HISTORY:  Patient denies tobacco or ethanol use, saying she has not  smoked cigarettes in over 30-40 years.  She denies drug use.   REVIEW OF SYSTEMS:  Negative for vision or hearing problems, although she  does wear glasses.  She denies nausea, vomiting, diarrhea, or constipation,  except as previously noted.  She denies polyuria, polydipsia, heat or cold  intolerance.  Recent weight changes, skin problems, arthritic complaints, or  neurologic problems.  She denies easy bruisability or hematologic problems.  The rest of her review of systems is negative.   PHYSICAL EXAMINATION:  VITAL SIGNS:  Initial blood pressure was 133/64.  The  pulse was 80 and regular.  Respirations are 18.  Temperature is 98.  GENERAL:  She is a pleasant 66 year old woman in no acute distress.  HEENT:  Normocephalic and atraumatic.  Pupils are equal and round.  Oropharynx is moist.  Sclerae are anicteric.  NECK:  No jugular venous distention.  There is no thyromegaly.  Trachea is  midline.  The carotids are 2+ and symmetric.  There is a soft bruit on the  right.  LUNGS:  Clear bilaterally to auscultation.  There are no wheezes, rales, or  rhonchi.  There is no increased work of breathing.  CARDIOVASCULAR:  Regular rate and rhythm with normal S1 and S2.  There is  an  S4 gallop present.  PMI is not laterally displaced or enlarged.  ABDOMEN:  Obese, nontender, nondistended.  There is no organomegaly.  Bowel  sounds are present.  There is no rebound or guarding.  EXTREMITIES:  No clubbing, cyanosis or edema.  Joints are normal.  NEUROLOGIC:  Alert and oriented x3 with cranial nerves intact.  Strength is  5/5 and symmetric.  SKIN:  Normal.   EKG demonstrates sinus rhythm with sinus rhythm with no acute ST/T wave  abnormality.   IMPRESSION:  1.  Chest pressure with shortness of breath and nausea.  2.  Hypertension.  3.  Dyslipidemia.  4.  History of pulmonary embolism.  5.  History of asthma and bronchitis.   DISCUSSION:  The patient's symptoms are somewhat unclear.  There are risks  for coronary artery disease and some typical as well as atypical features.  If her enzymes are negative, then I would recommend stress Cardiolite study.  If positive, catheterization.  For now, I will continue  Coumadin and check  fasting lipids.           ______________________________  Doylene Canning. Ladona Ridgel, M.D.     GWT/MEDQ  D:  03/12/2005  T:  03/13/2005  Job:  [id number]

## 2010-05-28 NOTE — H&P (Signed)
[facility]  Patient:    Caroline [name], Caroline [name]                          MRN: [id number] Adm. Date:  [id number] Attending:  Shelba Flake CC:         [facility]                         History and Physical  CHIEF COMPLAINT:  "I feel terrible."  HISTORY OF PRESENT ILLNESS:  Mrs. Collier is a 65 year old woman whose past medical history is most notable for asthma of childhood onset, reasonably well-controlled.  She was well, until about 10-14 days ago, when she developed upper respiratory infection symptoms including mild sore throat, nonproductive cough but no shortness of breath, malaise, myalgia, fevers, or chills.  This probable viral illness did not make her particularly ill and she was treated empirically through [facility] with cough suppressants.  She was actually improving, had a good day yesterday, but at approximately 2-3 a.m awoke with a shaking chill, a worsened cough (now wet-sounding but still nonproductive), shortness of breath, diffuse myalgias, nausea, loose stools, and dizziness. She used her albuterol inhaler with some relief in her breathing but continued to feel quite poorly and summoned EMS for transport to the [facility] Emergency Room.  She last had pneumonia about three years ago.  Maintains routine follow-up with Dr. ______.  Also, has been complaining of chronic right upper arm pain, onset last fall, associated with aching/burning often radiating into her fingers and into the right neck, sometimes associated with weakness, which is difficult to discern from guarding of activity secondary to discomfort.  She has never had shoulder nor neck injury and has not engaged in rapid, repetitive movements.  PAST MEDICAL HISTORY: 1. History of asthma, childhood onset, reasonably well-controlled.  She uses    albuterol inhaler 2 puffs in the morning, 2 puffs before bed, and often    awakens to use 2 puffs throughout the night for  nocturnal dyspnea.    Exacerbation includes inhaled smoke, dust, or pollen but not cold or    exposure to pets.  There has been no acute worsening recently. 2. History of hypertension diagnosed about four months ago. 3. Obesity.  MEDICATIONS: 1. Hydrochlorothiazide 25 mg p.o. q.d. 2. Flovent 220 mcg MDI 2 puffs b.i.d. 3. Albuterol 2 puffs p.r.n. 4. "Breathing pill."  ALLERGIES:  PENICILLIN and SHELLFISH (anaphylaxis).  FAMILY HISTORY:  Notable for a son with asthma, a brother and two sisters with hypertension, a brother with diabetes, sisters child with open heart surgery age 23, colon cancer of nephew, mother deceased of cancer of unknown origin age 4.  SOCIAL HISTORY:  The patient is single but with a close female friend, living in a home with her daughter (one of five children) and three grandchildren.  She is unemployed.  She does not smoke nor drink.  PHYSICAL EXAMINATION:  VITAL SIGNS:  Temperature 101.2, blood pressure 118/84, pulse 122 on arrival and 90 upon my examination, respirations 24 at rest and 30 with exertion, oxygen saturation 92-96% on room air with nonlabored respirations.  GENERAL:  She is a very obese lady looking ill but not toxic.  She is awake, alert, conversant, and appropriate.  HEENT:  Her eyes are noninjected.  Her oral mucosa is reasonably moist.  Her breath if foul.  NECK:  There is no significant cervical adenopathy.  No JVD and no carotid bruits.  LUNGS:  Clear to auscultation remarkably without wheezes but with a few right basilar crackles.  There is no rub.  No increased work of breathing.  HEART:  Regular rate and rhythm with no significant murmurs, rubs, or gallops.  ABDOMEN:  Obese, soft, and nontender with normoactive bowel sounds.  EXTREMITIES:  Well-perfused with strong distal pulses.  Legs and calves are symmetric without edema or calf tenderness.  She does have some varicose veins of the right leg.  SKIN:  Warm and  dry.  LABORATORY DATA:  Hemoglobin 12.2, white count 15.3.  ABGs: 7.54/34/52/29 on room air.  Chest x-ray with questionable right lower lobe infiltrate.  ASSESSMENT AND PLAN:  Mrs. Fruge appears to have community-acquired pneumonia, pneumococcal by the characteristic acute onset with shaking chills, although she has had a Pneumovax.  She has also had influenza vaccination this year. Although her blood pressure and oxygenation are stable and her bronchospasm is controlled, she experienced severe dizziness and presyncope upon sitting on the edge of the gurney with pulse rising from the 90s to the 130s, sinus tachycardia, although blood pressure remained stable.  She does not feel she is strong enough to go home, and we have elected admission for observation status and initiation of appropriate therapy.  She will be treated with IV Rocephin, antipyretics and analgesics, antiemetics, clear-liquid diet to be advanced as tolerated, IV fluids, continuation of asthma therapy with p.r.n. albuterol nebulizer as needed, oxygen supplementation as needed, and bedrest. D:  03/09/00 TD:  03/09/00 Job: [id number]

## 2010-05-28 NOTE — H&P (Signed)
Caroline [name], TEED                 ACCOUNT NO.:  [id number]   MEDICAL RECORD NO.:  [id number]          PATIENT TYPE:  INP   LOCATION:  1432                         FACILITY:  [facility]   PHYSICIAN:  Kela Millin, M.D.DATE OF BIRTH:  [birth year]   DATE OF ADMISSION:  03/12/2005  DATE OF DISCHARGE:                                HISTORY & PHYSICAL   PRIMARY CARE PHYSICIAN:  [facility].   CHIEF COMPLAINT:  Chest pain.   HISTORY OF PRESENT ILLNESS:  The patient is a 65 year old female with a past  medical history significant for hypertension, hypercholesterolemia, asthma,  and pulmonary embolism who presents with complaints of chest pain x4 hours.  She states that she was awakened by the chest pain and describes it as a  pressure, 6/10 in intensity, and left precordial in location.  Denies  associated nausea, vomiting, shortness of breath, radiation, and also  diaphoresis.  She states she has cough productive of brownish sputum.  Denies fevers, hematemesis, no melena, diarrhea, no hematochezia.   She was seen in the ER, and chest x-ray was negative for acute disease.  Also per point-of-care markers were negative.  She is admitted to the [facility] for further evaluation and management.   PAST MEDICAL HISTORY:  As stated above.   MEDICATIONS:  1.  Coumadin 5 mg daily.  2.  Hydrochlorothiazide 25 mg daily.  3.  Singulair 10 mg daily.  4.  Advair.  5.  Celexa 10 mg daily.  6.  Pravachol.   ALLERGIES:  PENICILLIN and SHELL FISH.   SOCIAL HISTORY:  She denies alcohol; states she quit tobacco 30 years ago.   FAMILY HISTORY:  Mother has cancer of unknown origin, and brother has  diabetes.  Her son has asthma.   REVIEW OF SYSTEMS:  As per HPI.  Other Review of Systems negative.   PHYSICAL EXAMINATION:  GENERAL: The patient is a pleasant, older female in  no apparent distress.  VITAL SIGNS: Temperature 97.6, blood pressure 140/92, pulse 93, respiratory  rate 24.   O2 saturation 97%.  HEENT:  PERRLA.  EOMI.  Sclerae anicteric.  Moist mucous membranes.  NECK:  No lymphadenopathy, no thyromegaly, no JVD, no carotid bruits.  LUNGS:  Clear to auscultation bilaterally.  No crackles or wheezes.  CARDIOVASCULAR:  Regular rate and rhythm  Normal S1 and S2.  No S3  appreciated.  ABDOMEN:  Soft. Bowel sounds present. Nontender, nondistended.  No  hepatosplenomegaly and no masses palpable.  EXTREMITIES: No cyanosis or edema.  NEUROLOGIC: Alert and oriented x3. Cranial nerves II-XII grossly intact.  Nonfocal exam.   LABORATORY DATA:  Chest x-ray: No acute disease, right lower lobe scarring  noted.   White cell count 6.3, hemoglobin 11.8, hematocrit 36.3, platelet count 206.  Sodium 140, potassium 3.2, chloride 102, CO2 29, glucose 121, BUN 16,  creatinine 0.8, calcium 8.8.   ASSESSMENT AND PLAN:  1.  Chest pain: The patient has multiple risks.  Will obtained serial      cardiac enzymes.  Will place patient on aspirin and nitrates.  Cardiology consult for risk stratification.  2.  Hypercholesterolemia: Obtain fasting lipid profile, continue Pravachol.  3.  Hypertension: Will continue hydrochlorothiazide.  4.  Asthma, stable:  Continue Advair and Singulair.  5.  Pulmonary embolus:  Will continue anticoagulation with PT and INR      monitoring.  6.  Hypokalemia: Replace potassium.      Kela Millin, M.D.  Electronically Signed     ACV/MEDQ  D:  03/12/2005  T:  03/12/2005  Job:  [id number]

## 2010-05-28 NOTE — Discharge Summary (Signed)
Caroline [name], Caroline [name]                 ACCOUNT NO.:  [id number]   MEDICAL RECORD NO.:  [id number]          PATIENT TYPE:  INP   LOCATION:  1432                         [facility]ACILITY:  [facility]   PHYSICIAN:  Kela Millin, M.D.DATE OF BIRTH:  February 08, 1945   DATE OF ADMISSION:  03/12/2005  DATE OF DISCHARGE:  03/15/2005                                 DISCHARGE SUMMARY   DISCHARGE DIAGNOSES:  1.  Chest pain, non-cardiac, likely secondary to esophageal spasms.  2.  Hypertension.  3.  Hypercholesterolemia.  4.  History of asthma.  5.  History of pulmonary embolism.   CONSULTS:  Cardiology, Dr. Lewayne Bunting   PROCEDURES AND STUDIES:  Stress Cardiolite.  No evidence of exercise-induced  myocardial ischemia.  Ejection fraction 72%, mild apical thinning.   HISTORY:  The patient is a 65 year old female with past medical history  significant for hypertension, hypercholesterolemia, asthma, pulmonary  embolism who presented with complaints of chest pain for four hours.  She  reported that she had been awakened by chest pain and described it as a  pressure that was 6/10 intensity, left precordial in location and she denied  nausea, vomiting, shortness of breath, radiation, and also no diaphoresis.  She stated that she had had a cough productive of brownish sputum.  Denied  fevers, hematemesis, melena, diarrhea, and no hematochezia.  In the ER she  had a chest x-ray that was negative for acute disease.  Her point of care  markers were negative for MI and she was admitted to the [facility] hospitalists  service for further evaluation and management.   PHYSICAL EXAMINATION:  VITAL SIGNS:  Temperature 97.6, blood pressure  140/92, pulse of 93, respiratory rate of 24, O2 saturation of 97%.  LUNGS:  Clear to auscultation bilaterally.  CARDIOVASCULAR:  Regular rate and rhythm with normal S1, S2, and no S3  appreciated.  The rest of the physical examination was noted to be within  normal limits as well.   LABORATORY DATA:  Her chest x-ray showed no acute disease.  Right lower lobe  scarring was noted.  Her white cell count 6.3 with a hemoglobin of 11.8,  hematocrit 36.3, platelet count of 206.  Sodium 140, potassium 3.2, chloride  102, CO2 of 29, glucose of 121, BUN 16, creatinine 0.8, calcium 8.8.   HOSPITAL COURSE:  #1 - CHEST PAIN:  Upon admission the patient had serial  cardiac enzymes done which were negative for MI.  Because of her multiple  risk factors cardiology was consulted and the stress Cardiolite was done and  it was negative for myocardial ischemia.  It was thought that the source of  her chest pain was likely GI-related and she was placed on proton-pump  inhibitors during her hospital stay and discharged on this.  Her chest pain  resolved and she remained chest pain-free throughout her hospital stay and  was discharged to follow up with her primary care physician.   #2 - HYPERTENSION:  Patient was maintained on her antihypertensives during  her hospital stay.   #3 - HISTORY OF PULMONARY EMBOLUS:  Patient was  continued on Coumadin during  her hospital stay with PT/INR monitoring.   #4 - HYPOKALEMIA:  Her potassium was replaced during her hospital stay.   #5 - HYPERCHOLESTEROLEMIA:  Patient was maintained on Pravachol during her  hospital stay.   DISCHARGE MEDICATIONS:  1.  Prilosec 20 mg daily.  2.  Patient to continue pre admission medications.   FOLLOW-UP CARE:  Dr. Audria Nine in two to four weeks.   CONDITION ON DISCHARGE:  Improved/stable.      Kela Millin, M.D.  Electronically Signed     ACV/MEDQ  D:  04/27/2005  T:  04/27/2005  Job:  [id number]   cc:   Maurice March, M.D.  Fax: [phone]

## 2010-05-28 NOTE — Discharge Summary (Signed)
[facility]  Patient:    Caroline [name], Caroline [name]                          MRN: [id number] Adm. Date:  [id number] Disc. Date: [id number] Attending:  Miguel Aschoff CC:         [facility]   Discharge Summary  DISCHARGE DIAGNOSES: 1. Bilateral pulmonary emboli.    a. Negative lower extremity Dopplers.    b. On anticoagulation therapy. 2. Bilateral pneumonia, community acquired. 3. Acute asthma exacerbation. 4. Microcytic anemia with hemoglobin 12.0 and mean corpuscular volume 77. 5. Hypertension. 6. Obesity. 7. Allergies to penicillin and shellfish.  DISCHARGE MEDICATIONS: 1. Coumadin 4 mg p.o. q.d. 2. Tequin 400 mg p.o. q.d. x 3 more days. 3. Advair 500/50 diskus one puff b.i.d. 4. Tussionex 5 ml q.12h. p.r.n. 5. Darvocet-N 100 one to two tablets q.6h. p.r.n. 6. Hydrochlorothiazide 25 mg p.o. q.d.  FOLLOWUP:  Ms. Zell will be followed at [facility] by Dr. Silvana Newness, the patients primary care Camary Sosa, on March 20, 2000, at 10 a.m.  During this followup visit, a new PT is to be rechecked.  Coumadin dose may need to be adjusted to maintain the INR between 2 and 3.  The overall plan is to treat this patient with anticoagulation therapy for at least six months for the bilateral pulmonary emboli.  CONSULTATIONS:  None.  PROCEDURES: 1. Lower extremity venous Dopplers negative for deep venous thrombosis. 2. Chest computed tomography scan consistent with bilateral pulmonary emboli    involving the right main pulmonary artery and branches.  There was a lesser    involvement of the branches of the left lower lobe.  Bilateral lower lobe    infiltrates were seen on computed tomography scan.  LABORATORY DATA AND X-RAY FINDINGS:  INR 3.8.  Hemoglobin 12.0, MCV 77, WBC 7.6, platelets 322.  Sodium 139, potassium 3.7, chloride 108, CO2 26, BUN 5, creatinine 1.1, glucose 103.  LFTs within normal limits with albumin 2.6.  On admission, potassium was  3.0.  WBC was 15.3 with a left shift.  ABG on room air with pH 7.54, pCO2 34 and pO2 52.  HOSPITAL COURSE:  [name] is a very pleasant 65 year old female admitted to [facility] on March 09, 2000, with the initial diagnosis of bilateral pneumonia and asthma exacerbation.  Please see admission H&P by Dr. Charissa Bash for further details regarding the history of presentation, physical exam and lab data.  #1 - BILATERAL PULMONARY EMBOLI:  As described previously, the initial diagnosis was bilateral community acquired pneumonia.  The patient was treated accordingly.  On day #2, the patients shortness of breath and chest pain was benign with therapy for community acquired pneumonia and asthmatic exacerbation.  Ms. Laird had some hemoptysis.  Dr. Mayford Knife obtained a CT scan of the chest with no improvement noticed despite the therapy started on admission.  The chest CT scan showed bilateral pulmonary emboli, greater on the right than the left.  The patient was started on heparin on March 11, 2000. Coumadin was started on March 11, 2000.  A therapeutic level was reached after four days of Coumadin.  We continued heparin for one more day to overlap the therapeutic INR on heparin.  The following day, the patients INR was 3.2.  We stopped heparin on March 16, 2000.  The patient required one more day of hospitalization due to complaints of dyspnea on exertion and pleuritic chest pain.  Due  to the patients morbid obesity and deconditioning with bilateral pulmonary emboli pneumonia versus lung infarction exacerbation, the patient was kept an extra night with symptoms.  The following day, on March 17, 2000, the patients pleuritic chest pain was significantly improved.  Her oxygen saturation was 94% on room air.  The INR was 3.8.  The therapeutic INR was due to the initial dose of 10 mg given the first day.  We adjusted the Coumadin dose at 4 mg.  The goal is to treat this patient for at  least six months with anticoagulation therapy.  No hypercoagulability studies or genetic screenings were obtained.  The patients pulmonary condition was improved by the time of discharge.  #2 - BILATERAL PNEUMONIA:  As described previously, the patient was admitted with the initial diagnosis of community acquired pneumonia.  The chest x-ray in the emergency department showed initially a left lower lobe infiltrate. After hydration, the CT scan showed bibasilar infiltrates.  Given that the patient had clinical symptoms of an infectious process, the patient was continued on antibiotic therapy.  Ms. Thetford became afebrile on day #2.  Her lung exam showed signs of improvement steadily throughout this hospital stay. We plan on treating this possible pneumonia for a total of 12 days.  We recommend following a chest x-ray on within two to three weeks of discharge.  #3 - ASTHMA EXACERBATION:  Ms. Thaker presented with clear signs of bronchospasm.  The patient was started on bronchodilator therapy and steroid therapy MDI.  No intravenous steroids or prednisone was required to control the patients bronchospasm.  At the time of discharge, there was no evidence of hypoxemia, nor bronchospasm was noted.  CONDITION ON DISCHARGE:  Improved. DD:  03/17/00 TD:  03/18/00 Job: [id number]

## 2010-10-04 LAB — PROTIME-INR: INR: 1.9 — ABNORMAL HIGH

## 2010-10-04 LAB — POCT CARDIAC MARKERS
CKMB, poc: 1.6
Myoglobin, poc: 98.8
Operator id: 294341
Troponin i, poc: <0.05

## 2010-10-04 LAB — DIFFERENTIAL
Basophils Absolute: 0
Basophils Relative: 0
Eosinophils Absolute: 0.2
Lymphocytes Relative: 36
Lymphs Abs: 2.7
Monocytes Absolute: 0.7
Neutrophils Relative %: 53

## 2010-10-04 LAB — I-STAT 8, (EC8 V) (CONVERTED LAB)
Acid-Base Excess: 5 — ABNORMAL HIGH
Bicarbonate: 29.4 — ABNORMAL HIGH
HCT: 38
Operator id: 294341
TCO2: 31
pCO2, Ven: 43.5 — ABNORMAL LOW
pH, Ven: 7.437 — ABNORMAL HIGH

## 2010-10-04 LAB — CBC
Platelets: 192
RDW: 14.4

## 2010-10-04 LAB — POCT I-STAT CREATININE: Operator id: 294341

## 2010-10-26 LAB — I-STAT 8, (EC8 V) (CONVERTED LAB)
Acid-Base Excess: 6 — ABNORMAL HIGH
Bicarbonate: 31.2 — ABNORMAL HIGH
Glucose, Bld: 91
Sodium: 140
TCO2: 33
pH, Ven: 7.459 — ABNORMAL HIGH

## 2010-10-26 LAB — DIFFERENTIAL
Basophils Absolute: 0
Basophils Relative: 0
Eosinophils Relative: 2
Monocytes Absolute: 0.5
Monocytes Relative: 8
Neutro Abs: 3.8

## 2010-10-26 LAB — POCT CARDIAC MARKERS
Myoglobin, poc: 144
Operator id: 161631
Troponin i, poc: <0.05

## 2010-10-26 LAB — CBC
HCT: 36
Hemoglobin: 11.7 — ABNORMAL LOW
MCHC: 32.6
RBC: 4.61
RDW: 14.4 — ABNORMAL HIGH

## 2010-10-26 LAB — POCT I-STAT CREATININE
Creatinine, Ser: 0.9
Operator id: 161631

## 2010-10-27 LAB — DIFFERENTIAL
Basophils Absolute: 0
Eosinophils Relative: 3
Lymphocytes Relative: 19
Monocytes Absolute: 0.7

## 2010-10-27 LAB — COMPREHENSIVE METABOLIC PANEL
ALT: 73 — ABNORMAL HIGH
AST: 40 — ABNORMAL HIGH
Albumin: 3.1 — ABNORMAL LOW
BUN: 10
CO2: 31
Chloride: 101
Chloride: 103
Creatinine, Ser: 0.73
Creatinine, Ser: 0.83
GFR calc Af Amer: >60
GFR calc non Af Amer: >60
Potassium: 3.5
Sodium: 139
Total Bilirubin: 0.6
Total Bilirubin: 0.6

## 2010-10-27 LAB — URINALYSIS, ROUTINE W REFLEX MICROSCOPIC
Bilirubin Urine: NEGATIVE
Glucose, UA: NEGATIVE
Hgb urine dipstick: NEGATIVE
Specific Gravity, Urine: 1.01

## 2010-10-27 LAB — CBC
HCT: 32 — ABNORMAL LOW
MCV: 77.1 — ABNORMAL LOW
MCV: 78.1
Platelets: 279
Platelets: 289
RBC: 4.1
WBC: 8.2
WBC: 8.3

## 2010-10-27 LAB — URINE MICROSCOPIC-ADD ON

## 2010-10-27 LAB — APTT: aPTT: 34

## 2010-10-27 LAB — PROTIME-INR: INR: 1.1

## 2010-10-28 LAB — BASIC METABOLIC PANEL
BUN: 11
CO2: 28
Calcium: 8.8
Calcium: 9
Chloride: 104
Creatinine, Ser: 0.79
Creatinine, Ser: 0.89
GFR calc Af Amer: >60
GFR calc Af Amer: >60
GFR calc non Af Amer: >60
Glucose, Bld: 124 — ABNORMAL HIGH
Potassium: 3.3 — ABNORMAL LOW
Potassium: 3.5
Sodium: 135

## 2010-10-28 LAB — CULTURE, BLOOD (ROUTINE X 2): Culture: NO GROWTH

## 2010-10-28 LAB — CBC
MCHC: 32.4
MCV: 78
MCV: 78.3
Platelets: 214
RBC: 4.14
RBC: 4.16
WBC: 10.5
WBC: 9.8

## 2010-10-28 LAB — URINALYSIS, ROUTINE W REFLEX MICROSCOPIC
Glucose, UA: NEGATIVE
Leukocytes, UA: NEGATIVE
Protein, ur: NEGATIVE
Specific Gravity, Urine: 1.012 (ref 1.005–1.035)
pH: 6

## 2010-10-28 LAB — LIPID PANEL
HDL: 40
Triglycerides: 110
VLDL: 22

## 2010-10-28 LAB — DIFFERENTIAL
Basophils Relative: 0
Eosinophils Absolute: 0.3
Neutrophils Relative %: 62

## 2010-10-28 LAB — D-DIMER, QUANTITATIVE: D-Dimer, Quant: 9.05 — ABNORMAL HIGH

## 2010-12-13 ENCOUNTER — Other Ambulatory Visit (HOSPITAL_COMMUNITY): Payer: Self-pay | Admitting: Family Medicine

## 2010-12-13 DIAGNOSIS — Z1231 Encounter for screening mammogram for malignant neoplasm of breast: Secondary | ICD-10-CM

## 2010-12-22 ENCOUNTER — Ambulatory Visit (HOSPITAL_COMMUNITY)
Admission: RE | Admit: 2010-12-22 | Discharge: 2010-12-22 | Disposition: A | Discharge status: Home / Self Care | Payer: Medicaid Other | Source: Ambulatory Visit | Attending: Family Medicine | Admitting: Family Medicine

## 2010-12-22 DIAGNOSIS — R0989 Other specified symptoms and signs involving the circulatory and respiratory systems: Secondary | ICD-10-CM

## 2010-12-22 NOTE — Progress Notes (Signed)
Bilateral carotid artery duplex completed at 13:30.  Preliminary report is no evidence of significant ICA stenosis.  Vertebral artery flow is antegrade. Smiley Houseman 12/22/2010, 3:16 PM

## 2011-01-03 ENCOUNTER — Encounter: Payer: Self-pay | Admitting: Emergency Medicine

## 2011-01-03 ENCOUNTER — Emergency Department (HOSPITAL_COMMUNITY): Payer: Medicaid Other

## 2011-01-03 ENCOUNTER — Emergency Department (HOSPITAL_COMMUNITY)
Admission: EM | Admit: 2011-01-03 | Discharge: 2011-01-03 | Disposition: A | Discharge status: Home / Self Care | Payer: Medicaid Other | Attending: Emergency Medicine | Admitting: Emergency Medicine

## 2011-01-03 ENCOUNTER — Other Ambulatory Visit: Payer: Self-pay

## 2011-01-03 DIAGNOSIS — G4459 Other complicated headache syndrome: Secondary | ICD-10-CM | POA: Insufficient documentation

## 2011-01-03 DIAGNOSIS — R42 Dizziness and giddiness: Secondary | ICD-10-CM | POA: Insufficient documentation

## 2011-01-03 DIAGNOSIS — I10 Essential (primary) hypertension: Secondary | ICD-10-CM | POA: Insufficient documentation

## 2011-01-03 HISTORY — DX: Essential (primary) hypertension: I10

## 2011-01-03 LAB — CBC
HCT: 36.1 % (ref 36.0–46.0)
Hemoglobin: 11.5 g/dL — ABNORMAL LOW (ref 12.0–15.0)
MCH: 24.8 pg — ABNORMAL LOW (ref 26.0–34.0)
MCHC: 31.9 g/dL (ref 30.0–36.0)
MCV: 78 fL (ref 78.0–100.0)
Platelets: 184 10*3/uL (ref 150–400)
RBC: 4.63 MIL/uL (ref 3.87–5.11)
RDW: 14.8 % (ref 11.5–15.5)
WBC: 6.2 10*3/uL (ref 4.0–10.5)

## 2011-01-03 LAB — BASIC METABOLIC PANEL
BUN: 16 mg/dL (ref 6–23)
CO2: 29 mEq/L (ref 19–32)
Calcium: 10 mg/dL (ref 8.4–10.5)
Chloride: 103 mEq/L (ref 96–112)
Creatinine, Ser: 0.83 mg/dL (ref 0.50–1.10)
GFR calc Af Amer: 85 mL/min — ABNORMAL LOW (ref >90)
GFR calc non Af Amer: 73 mL/min — ABNORMAL LOW (ref >90)
Glucose, Bld: 128 mg/dL — ABNORMAL HIGH (ref 70–99)
Potassium: 3.8 mEq/L (ref 3.5–5.1)
Sodium: 140 mEq/L (ref 135–145)

## 2011-01-03 LAB — TROPONIN I: Troponin I: <0.3 ng/mL (ref <0.30)

## 2011-01-03 LAB — URINALYSIS, ROUTINE W REFLEX MICROSCOPIC
Bilirubin Urine: NEGATIVE
Glucose, UA: NEGATIVE mg/dL
Hgb urine dipstick: NEGATIVE
Ketones, ur: NEGATIVE mg/dL
Nitrite: NEGATIVE
Protein, ur: NEGATIVE mg/dL
Specific Gravity, Urine: 1.009 (ref 1.005–1.030)
Urobilinogen, UA: 0.2 mg/dL (ref 0.0–1.0)
pH: 6.5 (ref 5.0–8.0)

## 2011-01-03 LAB — URINE MICROSCOPIC-ADD ON

## 2011-01-03 MED ORDER — DIAZEPAM 5 MG/ML IJ SOLN
5.0000 mg | Freq: Once | INTRAMUSCULAR | Status: AC
  Administered 2011-01-03: 5 mg via INTRAVENOUS
  Filled 2011-01-03: qty 2

## 2011-01-03 MED ORDER — MECLIZINE HCL 25 MG PO TABS
25.0000 mg | ORAL_TABLET | Freq: Once | ORAL | Status: AC
  Administered 2011-01-03: 25 mg via ORAL
  Filled 2011-01-03: qty 1

## 2011-01-03 MED ORDER — MECLIZINE HCL 50 MG PO TABS
25.0000 mg | ORAL_TABLET | Freq: Three times a day (TID) | ORAL | Status: AC | PRN
Start: 2011-01-03 — End: 2011-01-13

## 2011-01-03 MED ORDER — SODIUM CHLORIDE 0.9 % IV BOLUS (SEPSIS)
1000.0000 mL | Freq: Once | INTRAVENOUS | Status: AC
  Administered 2011-01-03: 1000 mL via INTRAVENOUS

## 2011-01-03 NOTE — ED Provider Notes (Signed)
History    65yF with headache and dizziness. Onset between [phone]. Onset while watching soap opera. Gradual onset HA. Achy. Hurts in back of head. Feels lightheaded. No acute visual changes. NO fever or chills. Denies trauma. On coumadin for hx for presumed PE (blood clots in my chest). No acute numbness, tingling or loss of strength.  CSN: [id number]  Arrival date & time 01/03/11  0908   First MD Initiated Contact with Patient 01/03/11 0920      Chief Complaint  Patient presents with  . Dizziness  . Headache    (Consider location/radiation/quality/duration/timing/severity/associated sxs/prior treatment) HPI  Past Medical History  Diagnosis Date  . Hypertension   . Diabetes mellitus     No past surgical history on file.  No family history on file.  History  Substance Use Topics  . Smoking status: Not on file  . Smokeless tobacco: Not on file  . Alcohol Use:     OB History    Grav Para Term Preterm Abortions TAB SAB Ect Mult Living                  Review of Systems   Review of symptoms negative unless otherwise noted in HPI.  Allergies  Penicillins  Home Medications   Current Outpatient Rx  Name Route Sig Dispense Refill  . HYDROCHLOROTHIAZIDE 25 MG PO TABS Oral Take 25 mg by mouth daily.        BP 140/84  Pulse 94  Resp 18  SpO2 98%  Physical Exam  Nursing note and vitals reviewed. Constitutional: She is oriented to person, place, and time. She appears well-developed and well-nourished. No distress.  HENT:  Head: Normocephalic and atraumatic.  Right Ear: External ear normal.  Left Ear: External ear normal.       TMs and ext aud canals wnl.  Eyes: Conjunctivae are normal. Pupils are equal, round, and reactive to light. Right eye exhibits no discharge. Left eye exhibits no discharge.  Neck: Normal range of motion. Neck supple.  Cardiovascular: Normal rate, regular rhythm and normal heart sounds.  Exam reveals no gallop and no friction rub.     No murmur heard. Pulmonary/Chest: Effort normal and breath sounds normal. No respiratory distress. She has no wheezes.  Abdominal: Soft. She exhibits no distension. There is no tenderness.  Musculoskeletal: She exhibits no edema and no tenderness.  Lymphadenopathy:    She has no cervical adenopathy.  Neurological: She is alert and oriented to person, place, and time. No cranial nerve deficit. She exhibits normal muscle tone. Coordination normal.  Skin: Skin is warm and dry.  Psychiatric: She has a normal mood and affect. Her behavior is normal. Thought content normal.    ED Course  Procedures (including critical care time)  Labs Reviewed  CBC - Abnormal; Notable for the following:    Hemoglobin 11.5 (*)    MCH 24.8 (*)    All other components within normal limits  BASIC METABOLIC PANEL - Abnormal; Notable for the following:    Glucose, Bld 128 (*)    GFR calc non Af Amer 73 (*)    GFR calc Af Amer 85 (*)    All other components within normal limits  URINALYSIS, ROUTINE W REFLEX MICROSCOPIC - Abnormal; Notable for the following:    APPearance CLOUDY (*)    Leukocytes, UA SMALL (*)    All other components within normal limits  TROPONIN I  URINE MICROSCOPIC-ADD ON   Dg Chest 2 View  01/03/2011  *  RADIOLOGY REPORT*  Clinical Data: Dizziness  CHEST - 2 VIEW  Comparison: 03/31/2007  Findings: Heart size appears normal.  No pleural effusion or pulmonary edema identified.  There are linear opacities within the left midlung and right base which may represent areas of atelectasis or scarring.  Upper lobes appear clear.  IMPRESSION:  1.  Bilateral areas of atelectasis and/or scarring.  Original Report Authenticated By: Rosealee Albee, M.D.   Ct Head Wo Contrast  01/03/2011  *RADIOLOGY REPORT*  Clinical Data: Dizziness, headache.  CT HEAD WITHOUT CONTRAST  Technique:  Contiguous axial images were obtained from the base of the skull through the vertex without contrast.  Comparison: None.   Findings: No acute intracranial abnormality.  Specifically, no hemorrhage, hydrocephalus, mass lesion, acute infarction, or significant intracranial injury.  No acute calvarial abnormality. Visualized paranasal sinuses and mastoids clear.  Orbital soft tissues unremarkable.  IMPRESSION: No acute intracranial abnormality.  Original Report Authenticated By: Cyndie Chime, M.D.    EKG:  Rhythm: normal sinus Rate: 87 Axis: Left  Intervals: bifasicular block ST segments: NS ST changes. t wave inversions inferiorly and flattening in aVF.   1. Near syncope   2. Complicated headache syndromes       MDM  65yF with dizziness and headache. Head CT negative for acute pathology and completed within 6 hours of symptom onset. Doubt SAH. Doubt infectious. Do not think needs LP at this time. Imaging with no acute changes. Labs unremarkable. EKG nondiagnostic. Suspect primary cause of HA. Pt reports feeling better prior to DC and no new complaints. Feel safe for DC at this time. Outpt fu.      Raeford Razor, MD 01/09/11 [phone]

## 2011-01-03 NOTE — ED Notes (Signed)
Per EMS pt from home and c/o dizziness and headache to the back of her head 9/10. Pt reports chills this at 1 am. Pt alert and oriented. Family at bedside.

## 2011-01-03 NOTE — Discharge Instructions (Signed)
Near-Syncope °Near-syncope is sudden weakness, dizziness, or feeling like you might pass out (faint). This may occur when getting up after sitting or while standing for a long period of time. Near-syncope can be caused by a drop in blood pressure. This is a common reaction, but it may occur to a greater degree in people taking medicines to control their blood pressure. Fainting often occurs when the blood pressure or pulse is too low to provide enough blood flow to the brain to keep you conscious. Fainting and near-syncope are not usually due to serious medical problems. However, certain people should be more cautious in the event of near-syncope, including elderly patients, patients with diabetes, and patients with a history of heart conditions (especially irregular rhythms).  °CAUSES  °· Drop in blood pressure.  °· Physical pain.  °· Dehydration.  °· Heat exhaustion.  °· Emotional distress.  °· Low blood sugar.  °· Internal bleeding.  °· Heart and circulatory problems.  °· Infections.  °SYMPTOMS  °· Dizziness.  °· Feeling sick to your stomach (nauseous).  °· Nearly fainting.  °· Body numbness.  °· Turning pale.  °· Tunnel vision.  °· Weakness.  °HOME CARE INSTRUCTIONS  °· Lie down right away if you start feeling like you might faint. Breathe deeply and steadily. Wait until all the symptoms have passed. Most of these episodes last only a few minutes. You may feel tired for several hours.  °· Drink enough fluids to keep your urine clear or pale yellow.  °· If you are taking blood pressure or heart medicine, get up slowly, taking several minutes to sit and then stand. This can reduce dizziness that is caused by a drop in blood pressure.  °SEEK IMMEDIATE MEDICAL CARE IF:  °· You have a severe headache.  °· Unusual pain develops in the chest, abdomen, or back.  °· There is bleeding from the mouth or rectum, or you have black or tarry stool.  °· An irregular heartbeat or a very rapid pulse develops.  °· You have  repeated fainting or seizure-like jerking during an episode.  °· You faint when sitting or lying down.  °· You develop confusion.  °· You have difficulty walking.  °· Severe weakness develops.  °· Vision problems develop.  °MAKE SURE YOU:  °· Understand these instructions.  °· Will watch your condition.  °· Will get help right away if you are not doing well or get worse.  °Document Released: 12/27/2004 Document Revised: 09/08/2010 Document Reviewed: 02/12/2010 °[employer]® Patient Information ©2012 [employer]. °

## 2011-01-03 NOTE — ED Notes (Signed)
Went to explain discharge papers with pt and family.  Pt does not feel like she could leave ED due to dizziness and nausea.  Informed MD of response.

## 2011-01-13 ENCOUNTER — Ambulatory Visit (HOSPITAL_COMMUNITY): Payer: Medicaid Other

## 2011-03-07 ENCOUNTER — Ambulatory Visit (HOSPITAL_COMMUNITY)
Admission: RE | Admit: 2011-03-07 | Discharge: 2011-03-07 | Disposition: A | Discharge status: Home / Self Care | Payer: Medicaid Other | Source: Ambulatory Visit | Attending: Family Medicine | Admitting: Family Medicine

## 2011-03-07 DIAGNOSIS — Z1231 Encounter for screening mammogram for malignant neoplasm of breast: Secondary | ICD-10-CM | POA: Insufficient documentation

## 2011-03-17 ENCOUNTER — Other Ambulatory Visit: Payer: Self-pay | Admitting: Family Medicine

## 2011-03-30 ENCOUNTER — Encounter (HOSPITAL_COMMUNITY): Payer: Self-pay | Admitting: Cardiology

## 2011-03-30 ENCOUNTER — Emergency Department (HOSPITAL_COMMUNITY)
Admission: EM | Admit: 2011-03-30 | Discharge: 2011-03-30 | Disposition: A | Discharge status: Home / Self Care | Payer: Medicaid Other | Source: Home / Self Care | Attending: Family Medicine | Admitting: Family Medicine

## 2011-03-30 DIAGNOSIS — J45901 Unspecified asthma with (acute) exacerbation: Secondary | ICD-10-CM

## 2011-03-30 DIAGNOSIS — J301 Allergic rhinitis due to pollen: Secondary | ICD-10-CM

## 2011-03-30 DIAGNOSIS — IMO0001 Reserved for inherently not codable concepts without codable children: Secondary | ICD-10-CM

## 2011-03-30 HISTORY — DX: Dizziness and giddiness: R42

## 2011-03-30 MED ORDER — METHYLPREDNISOLONE ACETATE 80 MG/ML IJ SUSP
INTRAMUSCULAR | Status: AC
  Filled 2011-03-30: qty 1

## 2011-03-30 MED ORDER — FLUTICASONE PROPIONATE 50 MCG/ACT NA SUSP: 2.0000 | Freq: Every day | NASAL | Status: DC

## 2011-03-30 MED ORDER — GI COCKTAIL ~~LOC~~
ORAL | Status: AC
  Filled 2011-03-30: qty 30

## 2011-03-30 MED ORDER — ALBUTEROL SULFATE (5 MG/ML) 0.5% IN NEBU
INHALATION_SOLUTION | RESPIRATORY_TRACT | Status: AC
  Filled 2011-03-30: qty 1

## 2011-03-30 MED ORDER — ALBUTEROL SULFATE (5 MG/ML) 0.5% IN NEBU
5.0000 mg | INHALATION_SOLUTION | Freq: Once | RESPIRATORY_TRACT | Status: AC
  Administered 2011-03-30: 5 mg via RESPIRATORY_TRACT

## 2011-03-30 MED ORDER — GUAIFENESIN-CODEINE 100-10 MG/5ML PO SYRP: 10.0000 mL | ORAL_SOLUTION | Freq: Three times a day (TID) | ORAL | Status: AC | PRN

## 2011-03-30 MED ORDER — IPRATROPIUM BROMIDE 0.02 % IN SOLN
0.5000 mg | Freq: Once | RESPIRATORY_TRACT | Status: AC
  Administered 2011-03-30: 0.5 mg via RESPIRATORY_TRACT

## 2011-03-30 MED ORDER — METHYLPREDNISOLONE ACETATE 40 MG/ML IJ SUSP
80.0000 mg | Freq: Once | INTRAMUSCULAR | Status: AC
  Administered 2011-03-30: 80 mg via INTRAMUSCULAR

## 2011-03-30 MED ORDER — GI COCKTAIL ~~LOC~~
30.0000 mL | Freq: Once | ORAL | Status: AC
  Administered 2011-03-30: 30 mL via ORAL

## 2011-03-30 NOTE — Discharge Instructions (Signed)
Drink plenty of fluids as discussed, use medicine as prescribed, and mucinex for cough. Return or see your doctor if further problems °

## 2011-03-30 NOTE — ED Notes (Signed)
Pt states she feels much better after nebulizer treatment. Pt noted to be breathing easier. Pt also reports her throat is not as sore any more.

## 2011-03-30 NOTE — ED Notes (Signed)
Pt has nasal congestion and cough since the 12th of March. Had a fever 1 week ago but not since then. Pt reports unable to sleep at night.

## 2011-03-30 NOTE — ED Provider Notes (Signed)
History     CSN: [id number]  Arrival date & time 03/30/11  4098   First MD Initiated Contact with Patient 03/30/11 1936      Chief Complaint  Patient presents with  . Cough  . Nasal Congestion    (Consider location/radiation/quality/duration/timing/severity/associated sxs/prior treatment) Patient is a 66 y.o. female presenting with cough. The history is provided by the patient.  Cough This is a new problem. The current episode started more than 1 week ago. The problem occurs constantly. The problem has not changed since onset.The cough is non-productive. There has been no fever. Associated symptoms include rhinorrhea. Pertinent negatives include no sore throat and no wheezing. She is not a smoker. Her past medical history is significant for asthma.    Past Medical History  Diagnosis Date  . Hypertension   . Diabetes mellitus   . Vertigo     History reviewed. No pertinent past surgical history.  No family history on file.  History  Substance Use Topics  . Smoking status: Never Smoker   . Smokeless tobacco: Not on file  . Alcohol Use: No    OB History    Grav Para Term Preterm Abortions TAB SAB Ect Mult Living                  Review of Systems  Constitutional: Negative.   HENT: Positive for congestion and rhinorrhea. Negative for sore throat.   Respiratory: Positive for cough. Negative for wheezing.   Gastrointestinal: Negative.     Allergies  Penicillins  Home Medications   Current Outpatient Rx  Name Route Sig Dispense Refill  . ACETAMINOPHEN 500 MG PO TABS Oral Take 500 mg by mouth every 6 (six) hours as needed. pain     . ALBUTEROL SULFATE HFA 108 (90 BASE) MCG/ACT IN AERS Inhalation Inhale 2 puffs into the lungs every 6 (six) hours as needed. SOB     . CALCIUM CITRATE-VITAMIN D 315-200 MG-UNIT PO TABS Oral Take 1 tablet by mouth daily.    [name] FLUTICASONE-SALMETEROL 500-50 MCG/DOSE IN AEPB Inhalation Inhale 1 puff into the lungs every 12 (twelve) hours.       [name] LISINOPRIL-HYDROCHLOROTHIAZIDE 20-25 MG PO TABS Oral Take 1 tablet by mouth daily.      [name] MECLIZINE HCL 25 MG PO TABS Oral Take 25 mg by mouth 3 (three) times daily as needed.    . CENTRUM SILVER ULTRA WOMENS PO TABS Oral Take 1 tablet by mouth daily.    [name] PRAVASTATIN SODIUM 40 MG PO TABS Oral Take 40 mg by mouth daily.      . WARFARIN SODIUM 4 MG PO TABS Oral Take 4 mg by mouth daily. Tues,thursday,friday,sat,sun     . WARFARIN SODIUM 6 MG PO TABS Oral Take 6 mg by mouth daily. Monday,wednesday     . FLUTICASONE PROPIONATE 50 MCG/ACT NA SUSP Nasal Place 2 sprays into the nose daily. 1 g 2  . GUAIFENESIN-CODEINE 100-10 MG/5ML PO SYRP Oral Take 10 mLs by mouth 3 (three) times daily as needed for cough. 180 mL 0    BP 155/84  Pulse 96  Temp(Src) 98.9 F (37.2 C) (Oral)  Resp 20  SpO2 98%  Physical Exam  Nursing note and vitals reviewed. Constitutional: She is oriented to person, place, and time. She appears well-developed and well-nourished.  HENT:  Head: Normocephalic.  Right Ear: External ear normal.  Left Ear: External ear normal.  Nose: Mucosal edema and rhinorrhea present.  Mouth/Throat: Oropharynx is clear and  moist.  Eyes: Conjunctivae are normal. Pupils are equal, round, and reactive to light.  Neck: Normal range of motion. Neck supple.  Cardiovascular: Normal rate, normal heart sounds and intact distal pulses.   Pulmonary/Chest: Effort normal and breath sounds normal.  Abdominal: Soft. Bowel sounds are normal.  Lymphadenopathy:    She has no cervical adenopathy.  Neurological: She is alert and oriented to person, place, and time.  Skin: Skin is warm and dry.    ED Course  Procedures (including critical care time)  Labs Reviewed - No data to display No results found.   1. Allergic rhinitis due to pollen   2. Asthma exacerbation, allergic       MDM  Seen by lmd 8 days ago, no rx given.        Linna Hoff, MD 03/30/11 2104

## 2011-08-22 ENCOUNTER — Ambulatory Visit: Payer: Medicaid Other | Attending: Neurology | Admitting: *Deleted

## 2011-08-22 DIAGNOSIS — IMO0001 Reserved for inherently not codable concepts without codable children: Secondary | ICD-10-CM | POA: Insufficient documentation

## 2011-08-22 DIAGNOSIS — H8110 Benign paroxysmal vertigo, unspecified ear: Secondary | ICD-10-CM | POA: Insufficient documentation

## 2011-09-06 ENCOUNTER — Encounter: Payer: Medicaid Other | Admitting: *Deleted

## 2011-09-13 ENCOUNTER — Ambulatory Visit (INDEPENDENT_AMBULATORY_CARE_PROVIDER_SITE_OTHER): Payer: Medicaid Other | Admitting: Internal Medicine

## 2011-09-13 ENCOUNTER — Encounter: Payer: Self-pay | Admitting: Internal Medicine

## 2011-09-13 VITALS — BP 141/82 | HR 80 | Temp 97.3°F | Ht 66.0 in | Wt 279.1 lb

## 2011-09-13 DIAGNOSIS — Z86718 Personal history of other venous thrombosis and embolism: Secondary | ICD-10-CM

## 2011-09-13 DIAGNOSIS — Z0000 Encounter for general adult medical examination without abnormal findings: Secondary | ICD-10-CM | POA: Insufficient documentation

## 2011-09-13 DIAGNOSIS — E785 Hyperlipidemia, unspecified: Secondary | ICD-10-CM

## 2011-09-13 DIAGNOSIS — I10 Essential (primary) hypertension: Secondary | ICD-10-CM

## 2011-09-13 DIAGNOSIS — J45909 Unspecified asthma, uncomplicated: Secondary | ICD-10-CM

## 2011-09-13 DIAGNOSIS — R42 Dizziness and giddiness: Secondary | ICD-10-CM

## 2011-09-13 DIAGNOSIS — Z79899 Other long term (current) drug therapy: Secondary | ICD-10-CM

## 2011-09-13 LAB — COMPLETE METABOLIC PANEL WITH GFR
AST: 22 U/L (ref 0–37)
Albumin: 3.9 g/dL (ref 3.5–5.2)
Alkaline Phosphatase: 69 U/L (ref 39–117)
Potassium: 4 mEq/L (ref 3.5–5.3)
Sodium: 143 mEq/L (ref 135–145)
Total Bilirubin: 0.3 mg/dL (ref 0.3–1.2)
Total Protein: 7.2 g/dL (ref 6.0–8.3)

## 2011-09-13 LAB — LIPID PANEL
HDL: 43 mg/dL (ref >39)
Total CHOL/HDL Ratio: 5.2 Ratio
VLDL: 50 mg/dL — ABNORMAL HIGH (ref 0–40)

## 2011-09-13 MED ORDER — MECLIZINE HCL 25 MG PO TABS: 25.0000 mg | ORAL_TABLET | Freq: Three times a day (TID) | ORAL | Status: DC | PRN

## 2011-09-13 NOTE — Patient Instructions (Signed)
1. You have done great job in taking all your medications. I appreciate it very much. Please continue doing that. 2. Please take all medications as prescribed.  3. If you have worsening of your symptoms or new symptoms arise, please call the clinic ([phone]), or go to the ER immediately if symptoms are severe.     

## 2011-09-13 NOTE — Assessment & Plan Note (Signed)
Patient is currently taking pravastatin 40 mg daily. She did not noticed any side effects, such as muscle pain. Her last LDL was 110 at 01/28/09. We'll continue current regimen and check lipid profile today.

## 2011-09-13 NOTE — Assessment & Plan Note (Signed)
Patient is currently taking albuterol inhaler, Advair inhaler, and Singulair. She does not have cough, wheezing, shortness of breath or chest pain. She doesn't have signs of acute exacerbation. We will continue current regimen.

## 2011-09-13 NOTE — Assessment & Plan Note (Addendum)
-  Patient refused colonoscopy, will give patient FOBT cards today.  -Patient refused TDaP and Zostavax vaccination, will postpone them. -Patient had hx of hysterectomy at 36 years ago (it was not due to cancer per patient. It is not very clear for detail). Patient refused Pap Smear.

## 2011-09-13 NOTE — Assessment & Plan Note (Signed)
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 She was found to have right lower extremity deep vein thrombosis in 2008. Patient also has morbid obesity. She is taking Coumadin chronically. Her last INR was 2.5 on 08/10/11 per patient. Currently patient doesn't have a tendency of bleeding. Will continue her Coumadin and let her follow up with Dr. Alexandria Lodge on 09/16/11.

## 2011-09-13 NOTE — Progress Notes (Signed)
Patient ID: Caroline [name], female   DOB: 02/19/1945, 66 y.o.   MRN: [id number]  Subjective:   Patient ID: Caroline [name] female   DOB: 01/12/1945 66 y.o.   MRN: [id number]  HPI: [name] is a 67 y.o. with a past medical history as outlined below, who presents for establishing a care with our clinic.  Patient used to be seen by Dr. Andrey [facility]an[facility]. She was transferred to [place] for continue care. Patient reports that she is taking all her medications regularly. She does not have a new complaints today. She feels good he generally.  1). Asthma: Patient is currently taking albuterol inhaler, Advair inhaler, and Singulair. She does not have cough, wheezing, shortness of breath or chest pain. She doesn't have signs of acute exacerbation. 2.) Hypertension: Patient is currently taking metoprolol and hydrochlorothiazide. Today her blood pressure is 141/82.  3.) History for pulmonary embolism: Patient is currently taking Coumadin. Her last INR was 2.5 (per patient) on 08/10/11. She does not have bleeding tendency. Patient does not have chest pain, shortness of breath, or signs of DVT. There is no tenderness over her calf areas bilaterally. 4.) Hyperlipidemia: Patient is currently taking pravastatin 40 mg daily. She did not noticed any side effects, such as muscle pain. Her last LDL was 110 at 01/28/09.  5.) Left should pain: patient has mild left should pain for 3 month which is controled with tylenol. It is getting better.   Denies fever, chills, fatigue, headaches,  cough, chest pain, SOB,  abdominal pain,diarrhea, constipation, dysuria, urgency, frequency, hematuria.     Past Medical History  Diagnosis Date  . Hypertension   . Diabetes mellitus   . Vertigo    Current Outpatient Prescriptions  Medication Sig Dispense Refill  . acetaminophen (TYLENOL) 500 MG tablet Take 500 mg by mouth every 6 (six) hours as needed. pain       . albuterol (PROVENTIL HFA;VENTOLIN HFA) 108 (90 BASE) MCG/ACT  inhaler Inhale 2 puffs into the lungs every 6 (six) hours as needed. SOB       . calcium citrate-vitamin D (CITRACAL+D) 315-200 MG-UNIT per tablet Take 1 tablet by mouth daily.      . fluticasone (FLONASE) 50 MCG/ACT nasal spray Place 2 sprays into the nose daily.  1 g  2  . Fluticasone-Salmeterol (ADVAIR) 500-50 MCG/DOSE AEPB Inhale 1 puff into the lungs every 12 (twelve) hours.        . hydrochlorothiazide (HYDRODIURIL) 25 MG tablet Take 25 mg by mouth daily.      . meclizine (ANTIVERT) 25 MG tablet Take 1 tablet (25 mg total) by mouth 3 (three) times daily as needed.  30 tablet  3  . metoprolol tartrate (LOPRESSOR) 25 MG tablet Take 25 mg by mouth 2 (two) times daily.      . montelukast (SINGULAIR) 10 MG tablet Take 10 mg by mouth at bedtime.      . Multiple Vitamins-Minerals (CENTRUM SILVER ULTRA WOMENS) TABS Take 1 tablet by mouth daily.      [name] omeprazole (PRILOSEC) 20 MG capsule Take 20 mg by mouth daily.      . pravastatin (PRAVACHOL) 40 MG tablet Take 40 mg by mouth daily.        [name] warfarin (COUMADIN) 4 MG tablet Take 4 mg by mouth daily. Tues,thursday,friday,sat,sun       . warfarin (COUMADIN) 6 MG tablet Take 6 mg by mouth daily. Monday,wednesday        No family  history on file. History   Social History  . Marital Status: Divorced    Spouse Name: N/A    Number of Children: N/A  . Years of Education: N/A   Social History Main Topics  . Smoking status: Never Smoker   . Smokeless tobacco: None  . Alcohol Use: No  . Drug Use: No  . Sexually Active: Yes    Birth Control/ Protection: None   Other Topics Concern  . None   Social History Narrative  . None   Review of Systems: General: no fevers, chills, no changes in body weight, no changes in appetite Skin: no rash HEENT: no blurry vision, hearing changes or sore throat Pulm: no dyspnea, coughing, wheezing CV: no chest pain, palpitations, shortness of breath Abd: no nausea/vomiting, abdominal pain,  diarrhea/constipation GU: no dysuria, hematuria, polyuria Ext: there is mild left shoulder pain. Neuro: no weakness, numbness, or tingling   Objective:  Physical Exam: Filed Vitals:   09/13/11 1323  BP: 141/82  Pulse: 80  Temp: 97.3 F (36.3 C)  TempSrc: Oral  Height: 5\' 6"  (1.676 m)  Weight: 279 lb 1.6 oz (126.599 kg)  SpO2: 96%   General: not in acute distress HEENT: PERRL, EOMI, no scleral icterus Cardiac: S1/S2, RRR, No murmurs, gallops or rubs Pulm: Good air movement bilaterally, Clear to auscultation bilaterally, No rales, wheezing, rhonchi or rubs. Abd: Soft,  nondistended, nontender, no rebound pain, no organomegaly, BS present Ext: No rashes or edema, 2+DP/PT pulse bilaterally. There is mild tenderness over left shoulder, but no redness, swelling or warmth over left shoulder. There is limitation to extension of left shoulder joint due to pain. Musculoskeletal: No joint deformities, erythema, or stiffness. Skin: no rashes. No skin bruise. Neuro: alert and oriented X3, cranial nerves II-XII grossly intact, muscle strength 5/5 in all extremeties,  sensation to light touch intact.  Psych.: patient is not psychotic, no suicidal or hemocidal ideation.   Assessment & Plan:

## 2011-09-13 NOTE — Assessment & Plan Note (Signed)
Patient is currently taking metoprolol and hydrochlorothiazide. Today her blood pressure is 141/82. Will continue current regimen. Will check her CMP.

## 2011-09-16 ENCOUNTER — Encounter: Payer: Medicaid Other | Admitting: *Deleted

## 2011-09-16 ENCOUNTER — Ambulatory Visit: Payer: Medicaid Other

## 2011-09-19 ENCOUNTER — Ambulatory Visit (INDEPENDENT_AMBULATORY_CARE_PROVIDER_SITE_OTHER): Payer: Medicaid Other | Admitting: Pharmacist

## 2011-09-19 DIAGNOSIS — Z7901 Long term (current) use of anticoagulants: Secondary | ICD-10-CM | POA: Insufficient documentation

## 2011-09-19 DIAGNOSIS — Z86718 Personal history of other venous thrombosis and embolism: Secondary | ICD-10-CM

## 2011-09-19 LAB — POCT INR: INR: 2.6

## 2011-09-19 NOTE — Progress Notes (Signed)
Anti-Coagulation Progress Note  Caroline [name] is a 66 y.o. female who is currently on an anti-coagulation regimen.    RECENT RESULTS: Recent results are below, the most recent result is correlated with a dose of 32 mg. per week: Lab Results  Component Value Date   INR 2.6 09/19/2011   INR 2.14* 12/22/2009   INR 2.52* 10/27/2009    ANTI-COAG DOSE:   Latest dosing instructions   Total Sun Mon Tue Wed Thu Fri Sat   32 4 mg 6 mg 4 mg 6 mg 4 mg 4 mg 4 mg    (4 mg1) (4 mg1.5) (4 mg1) (4 mg1.5) (4 mg1) (4 mg1) (4 mg1)         ANTICOAG SUMMARY: Anticoagulation Episode Summary              Current INR goal 2.0-3.0 Next INR check 10/10/2011   INR from last check 2.6 (09/19/2011)     Weekly max dose (mg)  Target end date Indefinite   Indications PULMONARY EMBOLISM, HX OF, Encounter for long-term (current) use of anticoagulants   INR check location [facility] Preferred lab    Send INR reminders to    Comments 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 The first she describes as "a blood clot in my leg"; the second she describes as "a blood clot in my lung". Only the PE is documented on her problem list as "PE, History of". States she has been on warfarin for "10 years".             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 The first she describes as "a blood clot in my leg"; the second she describes as "a blood clot in my lung". Only the PE is documented on her problem list as "PE, History of". States she has been on warfarin  for "10 years".             PATIENT INSTRUCTIONS: Patient Instructions  Patient instructed to take medications as defined in the Anti-coagulation Track section of this encounter.  Patient instructed to take today's dose.  Patient verbalized understanding of these instructions.        FOLLOW-UP Return in 3 weeks (on 10/10/2011) for Follow up INR at 1130h.  Hulen Luster, III Pharm.D., CACP

## 2011-09-19 NOTE — Patient Instructions (Signed)
Patient instructed to take medications as defined in the Anti-coagulation Track section of this encounter.  Patient instructed to take today's dose.  Patient verbalized understanding of these instructions.    

## 2011-09-20 ENCOUNTER — Encounter: Payer: Self-pay | Admitting: Internal Medicine

## 2011-09-20 DIAGNOSIS — I82409 Acute embolism and thrombosis of unspecified deep veins of unspecified lower extremity: Secondary | ICD-10-CM | POA: Insufficient documentation

## 2011-09-23 ENCOUNTER — Encounter: Payer: Medicaid Other | Admitting: *Deleted

## 2011-09-27 ENCOUNTER — Other Ambulatory Visit (INDEPENDENT_AMBULATORY_CARE_PROVIDER_SITE_OTHER): Payer: Medicaid Other

## 2011-09-27 DIAGNOSIS — Z7901 Long term (current) use of anticoagulants: Secondary | ICD-10-CM

## 2011-09-27 DIAGNOSIS — Z79899 Other long term (current) drug therapy: Secondary | ICD-10-CM

## 2011-09-27 LAB — POC HEMOCCULT BLD/STL (HOME/3-CARD/SCREEN)
Card #2 Fecal Occult Blod, POC: NEGATIVE
Card #3 Fecal Occult Blood, POC: NEGATIVE

## 2011-10-05 ENCOUNTER — Other Ambulatory Visit: Payer: Self-pay | Admitting: Internal Medicine

## 2011-10-05 DIAGNOSIS — E785 Hyperlipidemia, unspecified: Secondary | ICD-10-CM

## 2011-10-05 MED ORDER — PRAVASTATIN SODIUM 40 MG PO TABS: 80.0000 mg | ORAL_TABLET | Freq: Every day | ORAL | Status: DC

## 2011-10-10 ENCOUNTER — Ambulatory Visit (INDEPENDENT_AMBULATORY_CARE_PROVIDER_SITE_OTHER): Payer: Medicaid Other | Admitting: Pharmacist

## 2011-10-10 DIAGNOSIS — Z7901 Long term (current) use of anticoagulants: Secondary | ICD-10-CM

## 2011-10-10 DIAGNOSIS — Z86718 Personal history of other venous thrombosis and embolism: Secondary | ICD-10-CM

## 2011-10-10 NOTE — Patient Instructions (Signed)
Patient instructed to take medications as defined in the Anti-coagulation Track section of this encounter.  Patient instructed to take today's dose.  Patient verbalized understanding of these instructions.    

## 2011-10-10 NOTE — Progress Notes (Signed)
Anti-Coagulation Progress Note  Caroline [name] is a 66 y.o. female who is currently on an anti-coagulation regimen.    RECENT RESULTS: Recent results are below, the most recent result is correlated with a dose of 32 mg. per week: Lab Results  Component Value Date   INR 3.60 10/10/2011   INR 2.6 09/19/2011   INR 2.14* 12/22/2009    ANTI-COAG DOSE:   Latest dosing instructions   Total Sun Mon Tue Wed Thu Fri Sat   30 4 mg 4 mg 4 mg 6 mg 4 mg 4 mg 4 mg    (4 mg1) (4 mg1) (4 mg1) (4 mg1.5) (4 mg1) (4 mg1) (4 mg1)         ANTICOAG SUMMARY: Anticoagulation Episode Summary              Current INR goal 2.0-3.0 Next INR check 10/24/2011   INR from last check 3.60! 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 The first she describes as "a blood clot in my leg"; the second she describes as "a blood clot in my lung". Only the PE is documented on her problem list as "PE, History of". States she has been on warfarin for "10 years".             ANTICOAG TODAY: Anticoagulation Summary as of 10/10/2011              INR goal 2.0-3.0     Selected INR 3.60! 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 The first she describes as "a blood clot in my leg"; the second she describes as "a blood clot in my lung". Only the PE is documented on her problem list as "PE, History of". States she has been on  warfarin for "10 years".             PATIENT INSTRUCTIONS: Patient Instructions  Patient instructed to take medications as defined in the Anti-coagulation Track section of this encounter.  Patient instructed to take today's dose.  Patient verbalized understanding of these instructions.        FOLLOW-UP Return in 2 weeks (on 10/24/2011) for Follow up INR at 1115h.  Hulen Luster, III Pharm.D., CACP

## 2011-10-24 ENCOUNTER — Ambulatory Visit (INDEPENDENT_AMBULATORY_CARE_PROVIDER_SITE_OTHER): Payer: Medicaid Other | Admitting: Pharmacist

## 2011-10-24 ENCOUNTER — Ambulatory Visit (INDEPENDENT_AMBULATORY_CARE_PROVIDER_SITE_OTHER): Payer: Medicaid Other | Admitting: *Deleted

## 2011-10-24 DIAGNOSIS — Z23 Encounter for immunization: Secondary | ICD-10-CM

## 2011-10-24 DIAGNOSIS — Z86718 Personal history of other venous thrombosis and embolism: Secondary | ICD-10-CM

## 2011-10-24 DIAGNOSIS — Z7901 Long term (current) use of anticoagulants: Secondary | ICD-10-CM

## 2011-10-24 NOTE — Patient Instructions (Signed)
Anti-Coagulation Progress Note  Caroline [name] is a 66 y.o. female who is currently on an anti-coagulation regimen.    RECENT RESULTS: Recent results are below, the most recent result is correlated with a dose of 30 mg. per week: Lab Results  Component Value Date   INR 3.40 10/24/2011   INR 3.60 10/10/2011   INR 2.6 09/19/2011    ANTI-COAG DOSE:   Latest dosing instructions   Total Sun Mon Tue Wed Thu Fri Sat   28 4 mg 4 mg 4 mg 4 mg 4 mg 4 mg 4 mg    (4 mg1) (4 mg1) (4 mg1) (4 mg1) (4 mg1) (4 mg1) (4 mg1)         ANTICOAG SUMMARY: @ANTICOAGSUMMARY @  ANTICOAG TODAY: @ANTICOAGTODAY @  PATIENT INSTRUCTIONS: @PATINSTR @   FOLLOW-UP No Follow-up on file.  Hulen Luster, III Pharm.D., CACP

## 2011-10-24 NOTE — Progress Notes (Signed)
Patient instructed to take medications as defined in the Anti-coagulation Track section of this encounter.  Patient instructed to take today's dose.  Patient verbalized understanding of these instructions.    

## 2011-11-07 ENCOUNTER — Ambulatory Visit (INDEPENDENT_AMBULATORY_CARE_PROVIDER_SITE_OTHER): Payer: Medicaid Other | Admitting: Pharmacist

## 2011-11-07 ENCOUNTER — Other Ambulatory Visit: Payer: Self-pay | Admitting: *Deleted

## 2011-11-07 DIAGNOSIS — Z7901 Long term (current) use of anticoagulants: Secondary | ICD-10-CM

## 2011-11-07 DIAGNOSIS — Z86718 Personal history of other venous thrombosis and embolism: Secondary | ICD-10-CM

## 2011-11-07 MED ORDER — ALBUTEROL SULFATE HFA 108 (90 BASE) MCG/ACT IN AERS: 2.0000 | INHALATION_SPRAY | Freq: Four times a day (QID) | RESPIRATORY_TRACT | Status: DC | PRN

## 2011-11-07 NOTE — Progress Notes (Signed)
Anti-Coagulation Progress Note  Caroline [name] is a 66 y.o. female who is currently on an anti-coagulation regimen.    RECENT RESULTS: Recent results are below, the most recent result is correlated with a dose of 28 mg. per week: Lab Results  Component Value Date   INR 2.80 11/07/2011   INR 3.40 10/24/2011   INR 3.60 10/10/2011    ANTI-COAG DOSE:   Latest dosing instructions   Total Sun Mon Tue Wed Thu Fri Sat   28 4 mg 4 mg 4 mg 4 mg 4 mg 4 mg 4 mg    (4 mg1) (4 mg1) (4 mg1) (4 mg1) (4 mg1) (4 mg1) (4 mg1)         ANTICOAG SUMMARY: Anticoagulation Episode Summary              Current INR goal 2.0-3.0 Next INR check 11/28/2011   INR from last check 2.80 (11/07/2011)     Weekly max dose (mg)  Target end date Indefinite   Indications PULMONARY EMBOLISM, HX OF, Encounter for long-term (current) use of anticoagulants   INR check location [facility] Preferred lab    Send INR reminders to    Comments 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 The first she describes as "a blood clot in my leg"; the second she describes as "a blood clot in my lung". Only the PE is documented on her problem list as "PE, History of". States she has been on warfarin for "10 years".             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 The first she describes as "a blood clot in my leg"; the second she describes as "a blood clot in my lung". Only the PE is documented on her problem list as "PE, History of". States she has been on  warfarin for "10 years".             PATIENT INSTRUCTIONS: Patient Instructions  Patient instructed to take medications as defined in the Anti-coagulation Track section of this encounter.  Patient instructed to take today's dose.  Patient verbalized understanding of these instructions.        FOLLOW-UP Return in 3 weeks (on 11/28/2011) for Follow up INR at 1115h.  Hulen Luster, III Pharm.D., CACP

## 2011-11-07 NOTE — Patient Instructions (Signed)
Patient instructed to take medications as defined in the Anti-coagulation Track section of this encounter.  Patient instructed to take today's dose.  Patient verbalized understanding of these instructions.    

## 2011-11-15 ENCOUNTER — Encounter: Payer: Self-pay | Admitting: Internal Medicine

## 2011-11-15 ENCOUNTER — Ambulatory Visit (INDEPENDENT_AMBULATORY_CARE_PROVIDER_SITE_OTHER): Payer: Medicaid Other | Admitting: Internal Medicine

## 2011-11-15 VITALS — BP 138/78 | HR 77 | Temp 97.1°F | Ht 66.0 in | Wt 279.9 lb

## 2011-11-15 DIAGNOSIS — I2699 Other pulmonary embolism without acute cor pulmonale: Secondary | ICD-10-CM

## 2011-11-15 DIAGNOSIS — I10 Essential (primary) hypertension: Secondary | ICD-10-CM

## 2011-11-15 DIAGNOSIS — R42 Dizziness and giddiness: Secondary | ICD-10-CM

## 2011-11-15 DIAGNOSIS — E785 Hyperlipidemia, unspecified: Secondary | ICD-10-CM

## 2011-11-15 DIAGNOSIS — J45909 Unspecified asthma, uncomplicated: Secondary | ICD-10-CM

## 2011-11-15 DIAGNOSIS — Z86718 Personal history of other venous thrombosis and embolism: Secondary | ICD-10-CM

## 2011-11-15 MED ORDER — METOPROLOL TARTRATE 25 MG PO TABS: 25.0000 mg | ORAL_TABLET | Freq: Two times a day (BID) | ORAL | Status: DC

## 2011-11-15 MED ORDER — ALBUTEROL SULFATE HFA 108 (90 BASE) MCG/ACT IN AERS: 2.0000 | INHALATION_SPRAY | Freq: Four times a day (QID) | RESPIRATORY_TRACT | Status: DC | PRN

## 2011-11-15 MED ORDER — WARFARIN SODIUM 4 MG PO TABS: 4.0000 mg | ORAL_TABLET | Freq: Every day | ORAL | Status: DC

## 2011-11-15 MED ORDER — FLUTICASONE-SALMETEROL 500-50 MCG/DOSE IN AEPB: 1.0000 | INHALATION_SPRAY | Freq: Two times a day (BID) | RESPIRATORY_TRACT | Status: DC

## 2011-11-15 MED ORDER — MECLIZINE HCL 25 MG PO TABS: 25.0000 mg | ORAL_TABLET | Freq: Three times a day (TID) | ORAL | Status: DC | PRN

## 2011-11-15 MED ORDER — HYDROCHLOROTHIAZIDE 25 MG PO TABS: 25.0000 mg | ORAL_TABLET | Freq: Every day | ORAL | Status: DC

## 2011-11-15 NOTE — Assessment & Plan Note (Signed)
Patient is currently taking metoprolol and hydrochlorothiazide. Today her blood pressure is 138/78. Will continue current regimen to followup.

## 2011-11-15 NOTE — Progress Notes (Signed)
Patient ID: [name], female   DOB: [birth year], 66 y.o.   MRN: [id number]   Subjective:   Patient ID: JOURDAN MALDONADO female   DOB: [birth year] 66 y.o.   MRN: [id number]  HPI: Ms.Araiya SHOSHANA JOHAL is a 66 y.o. with past medical history as outlined below, who presents for a followup visit.  Patient reports feeling great. There is no complaints. She reports that she has been doing exercise. She walks at least 30 minutes every day and takes healthy food everyday. She just wants to have some medication be refilled  1). Asthma: Patient is currently taking albuterol inhaler, Advair inhaler, and Singulair. She does not have cough, wheezing, shortness of breath or chest pain. She doesn't have signs of acute exacerbation. 2.) Hypertension: Patient is currently taking metoprolol and hydrochlorothiazide. Today her blood pressure is 138/78..   3.) History for pulmonary embolism: Patient is currently taking Coumadin. Her last INR was 2.8 on 11/07/11. She does not have bleeding tendency. Patient does not have chest pain, shortness of breath, or signs of DVT. There is no tenderness over her calf areas bilaterally. 4.) Hyperlipidemia: Her LDL was 130 at 09/13/11. Her pravastatin dosage was increased to 80 mg daily on 10/05/11. Patient is currently taking pravastatin 80 mg daily. She did not noticed any side effects, such as muscle pain.    Denies fever, chills, fatigue, headaches,  cough, chest pain, SOB,  abdominal pain,diarrhea, constipation, dysuria, urgency, frequency, hematuria.       Past Medical History  Diagnosis Date  . Hypertension   . Diabetes mellitus   . Vertigo   . DVT, lower extremity, recurrent     Patient had unprovoked PE on 2002 and DVT in right lower extremety 2008.  . PE (pulmonary embolism)     Patient had unprovoked PE on 2002   Current Outpatient Prescriptions  Medication Sig Dispense Refill  . acetaminophen (TYLENOL) 500 MG tablet Take 500 mg by mouth every 6 (six) hours as needed.  pain       . albuterol (PROVENTIL HFA;VENTOLIN HFA) 108 (90 BASE) MCG/ACT inhaler Inhale 2 puffs into the lungs every 6 (six) hours as needed. SOB  1 Inhaler  5  . calcium citrate-vitamin D (CITRACAL+D) 315-200 MG-UNIT per tablet Take 1 tablet by mouth daily.      . fluticasone (FLONASE) 50 MCG/ACT nasal spray Place 2 sprays into the nose daily.  1 g  2  . Fluticasone-Salmeterol (ADVAIR) 500-50 MCG/DOSE AEPB Inhale 1 puff into the lungs every 12 (twelve) hours.  60 each  5  . hydrochlorothiazide (HYDRODIURIL) 25 MG tablet Take 1 tablet (25 mg total) by mouth daily.  90 tablet  1  . meclizine (ANTIVERT) 25 MG tablet Take 1 tablet (25 mg total) by mouth 3 (three) times daily as needed.  90 tablet  1  . metoprolol tartrate (LOPRESSOR) 25 MG tablet Take 1 tablet (25 mg total) by mouth 2 (two) times daily.  120 tablet  1  . montelukast (SINGULAIR) 10 MG tablet Take 10 mg by mouth at bedtime.      . Multiple Vitamins-Minerals (CENTRUM SILVER ULTRA WOMENS) TABS Take 1 tablet by mouth daily.      [name] omeprazole (PRILOSEC) 20 MG capsule Take 20 mg by mouth daily.      . pravastatin (PRAVACHOL) 40 MG tablet Take 2 tablets (80 mg total) by mouth daily.  60 tablet  5  . warfarin (COUMADIN) 4 MG tablet Take 1 tablet (4 mg total)  by mouth daily. Tues,thursday,friday,sat,sun  60 tablet  1  . [DISCONTINUED] albuterol (PROVENTIL HFA;VENTOLIN HFA) 108 (90 BASE) MCG/ACT inhaler Inhale 2 puffs into the lungs every 6 (six) hours as needed. SOB  1 Inhaler  3  . [DISCONTINUED] Fluticasone-Salmeterol (ADVAIR) 500-50 MCG/DOSE AEPB Inhale 1 puff into the lungs every 12 (twelve) hours.        . [DISCONTINUED] hydrochlorothiazide (HYDRODIURIL) 25 MG tablet Take 25 mg by mouth daily.      . [DISCONTINUED] metoprolol tartrate (LOPRESSOR) 25 MG tablet Take 25 mg by mouth 2 (two) times daily.      . [DISCONTINUED] warfarin (COUMADIN) 4 MG tablet Take 4 mg by mouth daily. Tues,thursday,friday,sat,sun      . [DISCONTINUED] warfarin  (COUMADIN) 6 MG tablet Take 6 mg by mouth daily. Monday,wednesday        No family history on file. History   Social History  . Marital Status: Divorced    Spouse Name: N/A    Number of Children: N/A  . Years of Education: N/A   Social History Main Topics  . Smoking status: Never Smoker   . Smokeless tobacco: None  . Alcohol Use: No  . Drug Use: No  . Sexually Active: Yes    Birth Control/ Protection: None   Other Topics Concern  . None   Social History Narrative  . None   Review of Systems: General: no fevers, chills, no changes in body weight, no changes in appetite Skin: no rash HEENT: no blurry vision, hearing changes or sore throat Pulm: no dyspnea, coughing, wheezing CV: no chest pain, palpitations, shortness of breath Abd: no nausea/vomiting, abdominal pain, diarrhea/constipation GU: no dysuria, hematuria, polyuria Ext: there is mild left shoulder pain. Neuro: no weakness, numbness, or tingling   Objective:  Physical Exam: Filed Vitals:   11/15/11 1506  BP: 138/78  Pulse: 77  Temp: 97.1 F (36.2 C)  TempSrc: Oral  Height: 5\' 6"  (1.676 m)  Weight: 279 lb 14.4 oz (126.962 kg)  SpO2: 96%   General: not in acute distress HEENT: PERRL, EOMI, no scleral icterus Cardiac: S1/S2, RRR, No murmurs, gallops or rubs Pulm: Good air movement bilaterally, Clear to auscultation bilaterally, No rales, wheezing, rhonchi or rubs. Abd: Soft,  nondistended, nontender, no rebound pain, no organomegaly, BS present Ext: No rashes or edema, 2+DP/PT pulse bilaterally. Musculoskeletal: No joint deformities, erythema, or stiffness. Skin: no rashes. No skin bruise. Neuro: alert and oriented X3, cranial nerves II-XII grossly intact, muscle strength 5/5 in all extremeties,  sensation to light touch intact.   Psych.: patient is not psychotic, no suicidal or hemocidal ideation.   Assessment & Plan:

## 2011-11-15 NOTE — Patient Instructions (Signed)
1. You have done great job in taking all your medications. I appreciate it very much. Please continue doing that. 2. Please take all medications as prescribed.  3. If you have worsening of your symptoms or new symptoms arise, please call the clinic ([phone]), or go to the ER immediately if symptoms are severe.     

## 2011-11-15 NOTE — Assessment & Plan Note (Signed)
Patient is currently taking Coumadin 4 mg daily. Her last INR was therapeutic at 2.8 at the 11/07/11. Currently patient does not have chest pain or shortness of breath. There is no signs of DVT at legs. We'll continue current regimen and follow up.

## 2011-11-15 NOTE — Assessment & Plan Note (Signed)
It is stable. No signs of exacerbation. Lung auscultation is clear bilaterally. We'll continue current regimen.

## 2011-11-15 NOTE — Assessment & Plan Note (Signed)
Her LDL was 130 at 09/13/11. Her pravastatin dosage was increased to 80 mg daily on 10/05/11. Patient is currently taking pravastatin 80 mg daily. She did not noticed any side effects, such as muscle pain. We'll continue current regimen.

## 2011-11-28 ENCOUNTER — Ambulatory Visit (INDEPENDENT_AMBULATORY_CARE_PROVIDER_SITE_OTHER): Payer: Medicaid Other | Admitting: Pharmacist

## 2011-11-28 DIAGNOSIS — Z7901 Long term (current) use of anticoagulants: Secondary | ICD-10-CM

## 2011-11-28 DIAGNOSIS — Z86718 Personal history of other venous thrombosis and embolism: Secondary | ICD-10-CM

## 2011-11-28 LAB — POCT INR: INR: 2.9

## 2011-11-28 NOTE — Progress Notes (Signed)
Anti-Coagulation Progress Note  Caroline [name] is a 66 y.o. female who is currently on an anti-coagulation regimen.    RECENT RESULTS: Recent results are below, the most recent result is correlated with a dose of 28 mg. per week: Lab Results  Component Value Date   INR 2.90 11/28/2011   INR 2.80 11/07/2011   INR 3.40 10/24/2011    ANTI-COAG DOSE:   Latest dosing instructions   Total Sun Mon Tue Wed Thu Fri Sat   28 4 mg 4 mg 4 mg 4 mg 4 mg 4 mg 4 mg    (4 mg1) (4 mg1) (4 mg1) (4 mg1) (4 mg1) (4 mg1) (4 mg1)         ANTICOAG SUMMARY: Anticoagulation Episode Summary              Current INR goal 2.0-3.0 Next INR check 12/26/2011   INR from last check 2.90 (11/28/2011)     Weekly max dose (mg)  Target end date Indefinite   Indications PULMONARY EMBOLISM, HX OF [V12.51], Encounter for long-term (current) use of anticoagulants [V58.61]   [facility] check location [facility]    Send INR reminders to    Comments 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 The first she describes as "a blood clot in my leg"; the second she describes as "a blood clot in my lung". Only the PE is documented on her problem list as "PE, History of". States she has been on warfarin for "10 years".             ANTICOAG TODAY: Anticoagulation Summary as of 11/28/2011              INR goal 2.0-3.0     Selected INR 2.90 (11/28/2011) Next INR check 12/26/2011   Weekly max dose (mg)  Target end date Indefinite   Indications PULMONARY EMBOLISM, HX OF [V12.51], Encounter for long-term (current) use of anticoagulants [V58.61]    Anticoagulation Episode Summary              INR check location [facility]erred lab    Send INR reminders to    Comments 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 The first she describes as "a blood clot in my leg"; the second she describes as "a blood clot in my lung". Only the PE is documented on her problem list as "PE,  History of". States she has been on warfarin for "10 years".             PATIENT INSTRUCTIONS: Patient Instructions  Patient instructed to take medications as defined in the Anti-coagulation Track section of this encounter.  Patient instructed to take today's dose.  Patient verbalized understanding of these instructions.        FOLLOW-UP Return in 4 weeks (on 12/26/2011) for Follow up INR at 1100h.  Hulen Luster, III Pharm.D., CACP

## 2011-11-28 NOTE — Patient Instructions (Signed)
Patient instructed to take medications as defined in the Anti-coagulation Track section of this encounter.  Patient instructed to take today's dose.  Patient verbalized understanding of these instructions.    

## 2011-12-26 ENCOUNTER — Ambulatory Visit (INDEPENDENT_AMBULATORY_CARE_PROVIDER_SITE_OTHER): Payer: Medicaid Other | Admitting: Pharmacist

## 2011-12-26 DIAGNOSIS — Z86718 Personal history of other venous thrombosis and embolism: Secondary | ICD-10-CM

## 2011-12-26 DIAGNOSIS — Z7901 Long term (current) use of anticoagulants: Secondary | ICD-10-CM

## 2011-12-26 LAB — POCT INR: INR: 2.6

## 2011-12-26 NOTE — Patient Instructions (Signed)
Patient instructed to take medications as defined in the Anti-coagulation Track section of this encounter.  Patient instructed to take today's dose.  Patient verbalized understanding of these instructions.    

## 2011-12-26 NOTE — Progress Notes (Signed)
Anti-Coagulation Progress Note  Caroline [name] is a 66 y.o. female who is currently on an anti-coagulation regimen.    RECENT RESULTS: Recent results are below, the most recent result is correlated with a dose of 28 mg. per week: Lab Results  Component Value Date   INR 2.60 12/26/2011   INR 2.90 11/28/2011   INR 2.80 11/07/2011    ANTI-COAG DOSE:   Latest dosing instructions   Total Sun Mon Tue Wed Thu Fri Sat   28 4 mg 4 mg 4 mg 4 mg 4 mg 4 mg 4 mg    (4 mg1) (4 mg1) (4 mg1) (4 mg1) (4 mg1) (4 mg1) (4 mg1)         ANTICOAG SUMMARY: Anticoagulation Episode Summary              Current INR goal 2.0-3.0 Next INR check 01/23/2012   INR from last check 2.60 (12/26/2011)     Weekly max dose (mg)  Target end date Indefinite   Indications PULMONARY EMBOLISM, HX OF [V12.51], Encounter for long-term (current) use of anticoagulants [V58.61]   INR check location [facility]    Send INR reminders to    Comments 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 The first she describes as "a blood clot in my leg"; the second she describes as "a blood clot in my lung". Only the PE is documented on her problem list as "PE, History of". States she has been on warfarin for "10 years".             ANTICOAG TODAY: Anticoagulation Summary as of 12/26/2011              INR goal 2.0-3.0     Selected INR 2.60 (12/26/2011) Next INR check 01/23/2012   Weekly max dose (mg)  Target end date Indefinite   Indications PULMONARY EMBOLISM, HX OF [V12.51], Encounter for long-term (current) use of anticoagulants [V58.61]    Anticoagulation Episode Summary              INR check location [facility] Pref[facility]    Send INR reminders to    Comments 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 The first she describes as "a blood clot in my leg"; the second she describes as "a blood clot in my lung". Only the PE is documented on her problem list as "PE,  History of". States she has been on warfarin for "10 years".             PATIENT INSTRUCTIONS: Patient Instructions  Patient instructed to take medications as defined in the Anti-coagulation Track section of this encounter.  Patient instructed to take today's dose.  Patient verbalized understanding of these instructions.        FOLLOW-UP Return in 4 weeks (on 01/23/2012) for Follow up INR at 1100h.  Hulen Luster, III Pharm.D., CACP

## 2012-01-23 ENCOUNTER — Ambulatory Visit (INDEPENDENT_AMBULATORY_CARE_PROVIDER_SITE_OTHER): Payer: Medicaid Other | Admitting: Pharmacist

## 2012-01-23 DIAGNOSIS — Z7901 Long term (current) use of anticoagulants: Secondary | ICD-10-CM

## 2012-01-23 DIAGNOSIS — Z86718 Personal history of other venous thrombosis and embolism: Secondary | ICD-10-CM

## 2012-01-23 NOTE — Patient Instructions (Signed)
Patient instructed to take medications as defined in the Anti-coagulation Track section of this encounter.  Patient instructed to take today's dose.  Patient verbalized understanding of these instructions.    

## 2012-01-23 NOTE — Progress Notes (Signed)
Anti-Coagulation Progress Note  Infant Caroline [name] is a 67 y.o. female who is currently on an anti-coagulation regimen.    RECENT RESULTS: Recent results are below, the most recent result is correlated with a dose of 28 mg. per week: Lab Results  Component Value Date   INR 4.20 01/23/2012   INR 2.60 12/26/2011   INR 2.90 11/28/2011    ANTI-COAG DOSE:   Latest dosing instructions   Total Sun Mon Tue Wed Thu Fri Sat   24 4 mg 2 mg 4 mg 4 mg 2 mg 4 mg 4 mg    (4 mg1) (4 mg0.5) (4 mg1) (4 mg1) (4 mg0.5) (4 mg1) (4 mg1)         ANTICOAG SUMMARY: Anticoagulation Episode Summary              Current INR goal 2.0-3.0 Next INR check 02/13/2012   INR from last check 4.20! (01/23/2012)     Weekly max dose (mg)  Target end date Indefinite   Indications PULMONARY EMBOLISM, HX OF [V12.51], Encounter for long-term (current) use of anticoagulants [V58.61]   INR check location [facility]    Send INR reminders to    Comments 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 The first she describes as "a blood clot in my leg"; the second she describes as "a blood clot in my lung". Only the PE is documented on her problem list as "PE, History of". States she has been on warfarin for "10 years".             ANTICOAG TODAY: Anticoagulation Summary as of 01/23/2012              INR goal 2.0-3.0     Selected INR 4.20! (01/23/2012) Next INR check 02/13/2012   Weekly max dose (mg)  Target end date Indefinite   Indications PULMONARY EMBOLISM, HX OF [V12.51], Encounter for long-term (current) use of anticoagulants [V58.61]    Anticoagulation Episode Summary              INR check location [facility]    Send INR reminders to    Comments 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 The first she describes as "a blood clot in my leg"; the second she describes as "a blood clot in my lung". Only the PE is documented on her problem list as "PE,  History of". States she has been on warfarin for "10 years".             PATIENT INSTRUCTIONS: Patient Instructions  Patient instructed to take medications as defined in the Anti-coagulation Track section of this encounter.  Patient instructed to take today's dose.  Patient verbalized understanding of these instructions.        FOLLOW-UP Return in 3 weeks (on 02/13/2012) for Follow up INR at 1045h.  Hulen Luster, III Pharm.D., CACP

## 2012-01-31 ENCOUNTER — Encounter: Payer: Self-pay | Admitting: Internal Medicine

## 2012-01-31 ENCOUNTER — Ambulatory Visit (INDEPENDENT_AMBULATORY_CARE_PROVIDER_SITE_OTHER): Payer: Medicaid Other | Admitting: Internal Medicine

## 2012-01-31 ENCOUNTER — Encounter: Payer: Self-pay | Admitting: *Deleted

## 2012-01-31 VITALS — BP 139/80 | HR 74 | Temp 97.3°F | Ht 66.0 in | Wt 274.4 lb

## 2012-01-31 DIAGNOSIS — Z0000 Encounter for general adult medical examination without abnormal findings: Secondary | ICD-10-CM

## 2012-01-31 DIAGNOSIS — R05 Cough: Secondary | ICD-10-CM

## 2012-01-31 DIAGNOSIS — J45909 Unspecified asthma, uncomplicated: Secondary | ICD-10-CM

## 2012-01-31 DIAGNOSIS — R059 Cough, unspecified: Secondary | ICD-10-CM

## 2012-01-31 DIAGNOSIS — Z86718 Personal history of other venous thrombosis and embolism: Secondary | ICD-10-CM

## 2012-01-31 DIAGNOSIS — J069 Acute upper respiratory infection, unspecified: Secondary | ICD-10-CM

## 2012-01-31 MED ORDER — GUAIFENESIN-CODEINE 100-10 MG/5ML PO SYRP: 5.0000 mL | ORAL_SOLUTION | Freq: Three times a day (TID) | ORAL | Status: DC | PRN

## 2012-01-31 NOTE — Assessment & Plan Note (Signed)
Patient is currently taking Coumadin. Her last INR was super therapeutic at 4.2 on 01/13/12. Her coumadin dose was adjusted by dr. Alexandria Lodge. She will have appointment with Dr. Alexandria Lodge on 02/13/12. Currently patient does not have chest pain. Her O2 Sat is 96%. There is no signs of DVT at legs. Will continue current regimen and follow up.

## 2012-01-31 NOTE — Assessment & Plan Note (Addendum)
Patient's lung auscultation showed very minimal expiratory wheezing on the right upper lobe. Oxygen saturation is 96%.  It is unlikely that patient has asthma exacerbation. Will continue current regimen and follow up in clinic in 10 days. Patient is instructed to come back to the hospital if her symptoms get worse.

## 2012-01-31 NOTE — Assessment & Plan Note (Signed)
Patient's symptoms are most likely due to upper respiratory viral infection. Physical examination showed very minimal respiratory wheezing on the right upper lobe, no rales, rhonchi or rubs. Patient does not have chest pain, fever or chills. I don't think patient have asthma exacerbation or pneumonia at this moment.  -Will treat patient's symptomatically with Robitussin for cough.  -Patient is instructed to continue albuterol inhaler and Advair inhaler. Patient is instructed to come back to the hospital if her symptoms get worse. Will followup her at a clinic in 10 days.

## 2012-01-31 NOTE — Progress Notes (Signed)
Pt walked into clinic with c/o wheezing and SOB. Pt states she is not sleeping, sore throat and wheezing at night. Denies fever, intake okay.  Onset last week. No relief with inhaler.  Pt will wait in lobby for cancellation. She is unable to come back for a 2:15 appointment. If we can't see she will go to [facility] for evaluation.

## 2012-01-31 NOTE — Progress Notes (Signed)
Patient ID: Caroline [name], female   DOB: [birth year], 67 y.o.   MRN: [id number] Subjective:   Patient ID: Caroline [name] female   DOB: [birth year] 67 y.o.   MRN: [id number]  CC: acute visit, cough for 7 days. HPI:  [name] is a 67 y.o. lady with past medical history as outlined below, who presents for an acute visit today.  Patient reports that she has been having cough for about 7 days. She reports that her grandson had several weeks ago. Then she started cold-like symptoms, including cough, runny nose, sore throat, headache, feeling congested in her chest.   She also reported having yellow-colored sputum. She has mild wheezing and mild SOB, but no chest pain, palpitation, fever or chills. She does not have pain over calf areas.    Past Medical History  Diagnosis Date  . Hypertension   . Diabetes mellitus   . Vertigo   . DVT, lower extremity, recurrent     Patient had unprovoked PE on 2002 and DVT in right lower extremety 2008.  . PE (pulmonary embolism)     Patient had unprovoked PE on 2002   Current Outpatient Prescriptions  Medication Sig Dispense Refill  . acetaminophen (TYLENOL) 500 MG tablet Take 500 mg by mouth every 6 (six) hours as needed. pain       . albuterol (PROVENTIL HFA;VENTOLIN HFA) 108 (90 BASE) MCG/ACT inhaler Inhale 2 puffs into the lungs every 6 (six) hours as needed. SOB  1 Inhaler  5  . calcium citrate-vitamin D (CITRACAL+D) 315-200 MG-UNIT per tablet Take 1 tablet by mouth daily.      . fluticasone (FLONASE) 50 MCG/ACT nasal spray Place 2 sprays into the nose daily.  1 g  2  . Fluticasone-Salmeterol (ADVAIR) 500-50 MCG/DOSE AEPB Inhale 1 puff into the lungs every 12 (twelve) hours.  60 each  5  . hydrochlorothiazide (HYDRODIURIL) 25 MG tablet Take 1 tablet (25 mg total) by mouth daily.  90 tablet  1  . meclizine (ANTIVERT) 25 MG tablet Take 1 tablet (25 mg total) by mouth 3 (three) times daily as needed.  90 tablet  1  . montelukast (SINGULAIR) 10 MG tablet  Take 10 mg by mouth at bedtime.      . Multiple Vitamins-Minerals (CENTRUM SILVER ULTRA WOMENS) TABS Take 1 tablet by mouth daily.      [name] omeprazole (PRILOSEC) 20 MG capsule Take 20 mg by mouth daily.      . pravastatin (PRAVACHOL) 40 MG tablet Take 2 tablets (80 mg total) by mouth daily.  60 tablet  5  . warfarin (COUMADIN) 4 MG tablet Take 1 tablet (4 mg total) by mouth daily. Tues,thursday,friday,sat,sun  60 tablet  1  . guaiFENesin-codeine (ROBITUSSIN AC) 100-10 MG/5ML syrup Take 5 mLs by mouth 3 (three) times daily as needed for cough.  120 mL  0  . metoprolol tartrate (LOPRESSOR) 25 MG tablet Take 1 tablet (25 mg total) by mouth 2 (two) times daily.  120 tablet  1   No family history on file. History   Social History  . Marital Status: Divorced    Spouse Name: N/A    Number of Children: N/A  . Years of Education: N/A   Social History Main Topics  . Smoking status: Never Smoker   . Smokeless tobacco: None  . Alcohol Use: No  . Drug Use: No  . Sexually Active: Yes    Birth Control/ Protection: None   Other Topics Concern  .  None   Social History Narrative  . None    Review of Systems: General: no fevers, chills, no changes in body weight, no changes in appetite Skin: no rash HEENT: no blurry vision, hearing changes or sore throat Pulm: has mild SOB, coughing and wheezing CV: no chest pain, palpitations Abd: no nausea/vomiting, abdominal pain, diarrhea/constipation GU: no dysuria, hematuria, polyuria Ext: no arthralgias, myalgias Neuro: no weakness, numbness, or tingling  Objective:  Physical Exam: Filed Vitals:   01/31/12 1401  BP: 139/80  Pulse: 74  Temp: 97.3 F (36.3 C)  TempSrc: Oral  Height: 5\' 6"  (1.676 m)  Weight: 274 lb 6.4 oz (124.467 kg)  SpO2: 96%   General: Not in acute distress HEENT: PERRL, EOMI, no scleral icterus, No bruit or JVD. Mildly congested pharynx without tonsillar enlargement or exudation. Cardiac: S1/S2, RRR, No murmurs, gallops  or rubs Pulm: Good air movement bilaterally, there is very minimal expiratory wheezing over right upper lobe,  No rales, ronchi or rubs bilaterally. Abd: Soft,  nondistended, nontender, no rebound pain, no organomegaly, BS present Ext: No rashes or edema, 2+DP/PT pulse bilaterally Musculoskeletal: No joint deformities, erythema, or stiffness, ROM full and nontender Skin: no rashes. No skin bruise. Neuro: Alert and oriented X3, cranial nerves II-XII grossly intact, muscle strength 5/5 in all extremeties,  sensation to light touch intact.  Psych: patient is not psychotic, no suicidal or hemocidal ideation.    Assessment & Plan:

## 2012-01-31 NOTE — Patient Instructions (Signed)
1. You can take albuterol inhaler up to 6 times a day if you have worsening wheeze.  2. Please take all medications as prescribed.  3. If you have worsening of your symptoms or new symptoms arise, please call the clinic ([phone]), or go to the ER immediately if symptoms are severe.  You have done great job in taking all your medications. I appreciate it very much. Please continue doing that.

## 2012-01-31 NOTE — Assessment & Plan Note (Signed)
-  Patient's flu shot and a mammogram up to date -Patient refused tetanus vaccine, colonoscopy and less Zostavax today. Will postpone. She did not noticed any blood in her stool.

## 2012-02-08 ENCOUNTER — Other Ambulatory Visit: Payer: Self-pay | Admitting: Family Medicine

## 2012-02-08 NOTE — Telephone Encounter (Signed)
No paper chart °

## 2012-02-09 ENCOUNTER — Other Ambulatory Visit: Payer: Self-pay | Admitting: *Deleted

## 2012-02-09 ENCOUNTER — Other Ambulatory Visit: Payer: Self-pay | Admitting: Internal Medicine

## 2012-02-09 DIAGNOSIS — J309 Allergic rhinitis, unspecified: Secondary | ICD-10-CM

## 2012-02-09 MED ORDER — FLUTICASONE PROPIONATE 50 MCG/ACT NA SUSP: 2.0000 | Freq: Every day | NASAL | Status: DC

## 2012-02-09 NOTE — Telephone Encounter (Signed)
Flonase rx called to [facility].

## 2012-02-13 ENCOUNTER — Ambulatory Visit (INDEPENDENT_AMBULATORY_CARE_PROVIDER_SITE_OTHER): Payer: Medicaid Other | Admitting: Pharmacist

## 2012-02-13 DIAGNOSIS — Z86718 Personal history of other venous thrombosis and embolism: Secondary | ICD-10-CM

## 2012-02-13 DIAGNOSIS — Z7901 Long term (current) use of anticoagulants: Secondary | ICD-10-CM

## 2012-02-13 LAB — POCT INR: INR: 2.2

## 2012-02-13 NOTE — Progress Notes (Signed)
Anti-Coagulation Progress Note  Caroline [name] is a 67 y.o. female who is currently on an anti-coagulation regimen.    RECENT RESULTS: Recent results are below, the most recent result is correlated with a dose of 24 mg. per week: Lab Results  Component Value Date   INR 2.20 02/13/2012   INR 4.20 01/23/2012   INR 2.60 12/26/2011    ANTI-COAG DOSE:   Latest dosing instructions   Total Sun Mon Tue Wed Thu Fri Sat   24 4 mg 2 mg 4 mg 4 mg 2 mg 4 mg 4 mg    (4 mg1) (4 mg0.5) (4 mg1) (4 mg1) (4 mg0.5) (4 mg1) (4 mg1)         ANTICOAG SUMMARY: Anticoagulation Episode Summary              Current INR goal 2.0-3.0 Next INR check 03/12/2012   INR from last check 2.20 (02/13/2012)     Weekly max dose (mg)  Target end date Indefinite   Indications PULMONARY EMBOLISM, HX OF [V12.51], Encounter for long-term (current) use of anticoagulants [V58.61]   [facility] check location [facility]    Send INR reminders to    Comments 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 The first she describes as "a blood clot in my leg"; the second she describes as "a blood clot in my lung". Only the PE is documented on her problem list as "PE, History of". States she has been on warfarin for "10 years".             ANTICOAG TODAY: Anticoagulation Summary as of 02/13/2012              INR goal 2.0-3.0     Selected INR 2.20 (02/13/2012) Next INR check 03/12/2012   Weekly max dose (mg)  Target end date Indefinite   Indications PULMONARY EMBOLISM, HX OF [V12.51], Encounter for long-term (current) use of anticoagulants [V58.61]    Anticoagulation Episode Summary              [facility] check location [facility]    Send INR reminders to    Comments 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 The first she describes as "a blood clot in my leg"; the second she describes as "a blood clot in my lung". Only the PE is documented on her problem list as "PE, History of".  States she has been on warfarin for "10 years".             PATIENT INSTRUCTIONS: Patient Instructions  Patient instructed to take medications as defined in the Anti-coagulation Track section of this encounter.  Patient instructed to take today's dose.  Patient verbalized understanding of these instructions.        FOLLOW-UP Return in 4 weeks (on 03/12/2012) for Follow  up INR at 1030h.  Hulen Luster, III Pharm.D., CACP

## 2012-02-13 NOTE — Patient Instructions (Signed)
Patient instructed to take medications as defined in the Anti-coagulation Track section of this encounter.  Patient instructed to take today's dose.  Patient verbalized understanding of these instructions.    

## 2012-02-14 NOTE — Progress Notes (Signed)
Agree 

## 2012-03-10 ENCOUNTER — Other Ambulatory Visit: Payer: Self-pay | Admitting: Internal Medicine

## 2012-03-12 ENCOUNTER — Ambulatory Visit (INDEPENDENT_AMBULATORY_CARE_PROVIDER_SITE_OTHER): Payer: Medicaid Other | Admitting: Pharmacist

## 2012-03-12 DIAGNOSIS — Z7901 Long term (current) use of anticoagulants: Secondary | ICD-10-CM

## 2012-03-12 DIAGNOSIS — Z86718 Personal history of other venous thrombosis and embolism: Secondary | ICD-10-CM

## 2012-03-12 LAB — POCT INR: INR: 2.6

## 2012-03-12 NOTE — Progress Notes (Signed)
Ms. Komorowski' indication for anticoagulation reviewed.  Recurrent VTE, life-long.  Agree with Dr. Saralyn Pilar anticoagulation assessment and plan.

## 2012-03-12 NOTE — Patient Instructions (Signed)
Patient instructed to take medications as defined in the Anti-coagulation Track section of this encounter.  Patient instructed to take today's dose.  Patient verbalized understanding of these instructions.    

## 2012-03-12 NOTE — Progress Notes (Signed)
Anti-Coagulation Progress Note  Caroline [name] is a 67 y.o. female who is currently on an anti-coagulation regimen.    RECENT RESULTS: Recent results are below, the most recent result is correlated with a dose of 24 mg. per week: Lab Results  Component Value Date   INR 2.60 03/12/2012   INR 2.20 02/13/2012   INR 4.20 01/23/2012    ANTI-COAG DOSE: Anticoagulation Dose Instructions as of 03/12/2012     [name] Tue Wed Thu Fri Sat   New Dose 4 mg 2 mg 4 mg 4 mg 2 mg 4 mg 4 mg       ANTICOAG SUMMARY: Anticoagulation Episode Summary   Current INR goal 2.0-3.0  Next INR check 04/09/2012  INR from last check 2.60 (03/12/2012)  Weekly max dose   Target end date Indefinite  [facility] check location [facility]  Preferred lab   Send INR reminders to    Indications  PULMONARY EMBOLISM HX OF [V12.51] Encounter for long-term (current) use of anticoagulants [V58.61]        Comments 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 The first she describes as "a blood clot in my leg"; the second she describes as "a blood clot in my lung". Only the PE is documented on her problem list as "PE, History of". States she has been on warfarin for "10 years".         ANTICOAG TODAY: Anticoagulation Summary as of 03/12/2012   INR goal 2.0-3.0  Selected INR 2.60 (03/12/2012)  Next INR check 04/09/2012  Target end date Indefinite   Indications  PULMONARY EMBOLISM HX OF [V12.51] Encounter for long-term (current) use of anticoagulants [V58.61]      Anticoagulation Episode Summary   [facility] check location [facility]   Preferred lab    Send INR reminders to    Comments 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 The first she describes as "a blood clot in my leg"; the second she describes as "a blood clot in my lung". Only the PE is documented on her problem list as "PE, History of". States she has been on warfarin for "10 years".       PATIENT INSTRUCTIONS: Patient Instructions   Patient instructed to take medications as defined in the Anti-coagulation Track section of this encounter.  Patient instructed to take today's dose.  Patient verbalized understanding of these instructions.       FOLLOW-UP Return in 4 weeks (on 04/09/2012) for Follow up INR at 1030h.  Hulen Luster, III Pharm.D., CACP

## 2012-03-27 ENCOUNTER — Other Ambulatory Visit: Payer: Self-pay | Admitting: Internal Medicine

## 2012-03-27 DIAGNOSIS — E785 Hyperlipidemia, unspecified: Secondary | ICD-10-CM

## 2012-04-09 ENCOUNTER — Other Ambulatory Visit: Payer: Self-pay | Admitting: Internal Medicine

## 2012-04-09 ENCOUNTER — Ambulatory Visit (INDEPENDENT_AMBULATORY_CARE_PROVIDER_SITE_OTHER): Payer: Medicaid Other | Admitting: Pharmacist

## 2012-04-09 DIAGNOSIS — Z86718 Personal history of other venous thrombosis and embolism: Secondary | ICD-10-CM

## 2012-04-09 DIAGNOSIS — J45909 Unspecified asthma, uncomplicated: Secondary | ICD-10-CM

## 2012-04-09 DIAGNOSIS — Z7901 Long term (current) use of anticoagulants: Secondary | ICD-10-CM

## 2012-04-09 NOTE — Patient Instructions (Signed)
Patient instructed to take medications as defined in the Anti-coagulation Track section of this encounter.  Patient instructed to take today's dose.  Patient verbalized understanding of these instructions.    

## 2012-04-09 NOTE — Progress Notes (Signed)
Anti-Coagulation Progress Note  Caroline [name] is a 67 y.o. female who is currently on an anti-coagulation regimen.    RECENT RESULTS: Recent results are below, the most recent result is correlated with a dose of 24 mg. per week: Lab Results  Component Value Date   INR 2.00 04/09/2012   INR 2.60 03/12/2012   INR 2.20 02/13/2012    ANTI-COAG DOSE: Anticoagulation Dose Instructions as of 04/09/2012     [name] Tue Wed Thu Fri Sat   New Dose 4 mg 4 mg 4 mg 2 mg 4 mg 4 mg 4 mg       ANTICOAG SUMMARY: Anticoagulation Episode Summary   Current INR goal 2.0-3.0  Next INR check 05/07/2012  INR from last check 2.00 (04/09/2012)  Weekly max dose   Target end date Indefinite  [facility] check location [facility]  Preferred lab   Send INR reminders to    Indications  PULMONARY EMBOLISM HX OF [V12.51] Encounter for long-term (current) use of anticoagulants [V58.61]        Comments 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 The first she describes as "a blood clot in my leg"; the second she describes as "a blood clot in my lung". Only the PE is documented on her problem list as "PE, History of". States she has been on warfarin for "10 years".         ANTICOAG TODAY: Anticoagulation Summary as of 04/09/2012   INR goal 2.0-3.0  Selected INR 2.00 (04/09/2012)  Next INR check 05/07/2012  Target end date Indefinite   Indications  PULMONARY EMBOLISM HX OF [V12.51] Encounter for long-term (current) use of anticoagulants [V58.61]      Anticoagulation Episode Summary   [facility] check location [facility]   Preferred lab    Send INR reminders to    Comments 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 The first she describes as "a blood clot in my leg"; the second she describes as "a blood clot in my lung". Only the PE is documented on her problem list as "PE, History of". States she has been on warfarin for "10 years".       PATIENT INSTRUCTIONS: Patient Instructions   Patient instructed to take medications as defined in the Anti-coagulation Track section of this encounter.  Patient instructed to take today's dose.  Patient verbalized understanding of these instructions.       FOLLOW-UP Return in 4 weeks (on 05/07/2012) for Follow up INR at 1030h.  Hulen Luster, III Pharm.D., CACP

## 2012-04-10 NOTE — Progress Notes (Signed)
Indication: Recurrent VTE.  Duration: Life-long.  INR at target.  I agree with Dr. Groce's assessment and plan as documented. 

## 2012-05-07 ENCOUNTER — Other Ambulatory Visit: Payer: Self-pay | Admitting: *Deleted

## 2012-05-07 ENCOUNTER — Ambulatory Visit (INDEPENDENT_AMBULATORY_CARE_PROVIDER_SITE_OTHER): Payer: Medicaid Other | Admitting: Pharmacist

## 2012-05-07 DIAGNOSIS — J309 Allergic rhinitis, unspecified: Secondary | ICD-10-CM

## 2012-05-07 DIAGNOSIS — R42 Dizziness and giddiness: Secondary | ICD-10-CM

## 2012-05-07 DIAGNOSIS — E785 Hyperlipidemia, unspecified: Secondary | ICD-10-CM

## 2012-05-07 DIAGNOSIS — I2699 Other pulmonary embolism without acute cor pulmonale: Secondary | ICD-10-CM

## 2012-05-07 DIAGNOSIS — I10 Essential (primary) hypertension: Secondary | ICD-10-CM

## 2012-05-07 DIAGNOSIS — J45909 Unspecified asthma, uncomplicated: Secondary | ICD-10-CM

## 2012-05-07 DIAGNOSIS — Z86718 Personal history of other venous thrombosis and embolism: Secondary | ICD-10-CM

## 2012-05-07 DIAGNOSIS — Z7901 Long term (current) use of anticoagulants: Secondary | ICD-10-CM

## 2012-05-07 MED ORDER — WARFARIN SODIUM 4 MG PO TABS: 4.0000 mg | ORAL_TABLET | Freq: Every day | ORAL | Status: DC

## 2012-05-07 MED ORDER — MONTELUKAST SODIUM 10 MG PO TABS: 10.0000 mg | ORAL_TABLET | Freq: Every day | ORAL | Status: DC

## 2012-05-07 MED ORDER — FLUTICASONE-SALMETEROL 500-50 MCG/DOSE IN AEPB: 1.0000 | INHALATION_SPRAY | Freq: Two times a day (BID) | RESPIRATORY_TRACT | Status: DC

## 2012-05-07 MED ORDER — MECLIZINE HCL 25 MG PO TABS: ORAL_TABLET | ORAL | Status: DC

## 2012-05-07 MED ORDER — PRAVASTATIN SODIUM 40 MG PO TABS: ORAL_TABLET | ORAL | Status: DC

## 2012-05-07 MED ORDER — HYDROCHLOROTHIAZIDE 25 MG PO TABS: 25.0000 mg | ORAL_TABLET | Freq: Every day | ORAL | Status: DC

## 2012-05-07 MED ORDER — METOPROLOL TARTRATE 25 MG PO TABS: 25.0000 mg | ORAL_TABLET | Freq: Two times a day (BID) | ORAL | Status: DC

## 2012-05-07 MED ORDER — ALBUTEROL SULFATE HFA 108 (90 BASE) MCG/ACT IN AERS: 2.0000 | INHALATION_SPRAY | Freq: Four times a day (QID) | RESPIRATORY_TRACT | Status: DC | PRN

## 2012-05-07 MED ORDER — FLUTICASONE PROPIONATE 50 MCG/ACT NA SUSP: 2.0000 | Freq: Every day | NASAL | Status: DC

## 2012-05-07 NOTE — Progress Notes (Signed)
Anti-Coagulation Progress Note  Caroline [name] is a 67 y.o. female who is currently on an anti-coagulation regimen.    RECENT RESULTS: Recent results are below, the most recent result is correlated with a dose of 26 mg. per week: Lab Results  Component Value Date   INR 2.5 05/07/2012   INR 2.00 04/09/2012   INR 2.60 03/12/2012    ANTI-COAG DOSE: Anticoagulation Dose Instructions as of 05/07/2012     [name] Tue Wed Thu Fri Sat   New Dose 4 mg 4 mg 4 mg 2 mg 4 mg 4 mg 4 mg       ANTICOAG SUMMARY: Anticoagulation Episode Summary   Current INR goal 2.0-3.0  Next INR check 06/11/2012  INR from last check 2.5 (05/07/2012)  Weekly max dose   Target end date Indefinite  [facility] check location [facility]  Preferred lab   Send INR reminders to    Indications  PULMONARY EMBOLISM HX OF [V12.51] Encounter for long-term (current) use of anticoagulants [V58.61]        Comments 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 The first she describes as "a blood clot in my leg"; the second she describes as "a blood clot in my lung". Only the PE is documented on her problem list as "PE, History of". States she has been on warfarin for "10 years".         ANTICOAG TODAY: Anticoagulation Summary as of 05/07/2012   INR goal 2.0-3.0  Selected INR 2.5 (05/07/2012)  Next INR check 06/11/2012  Target end date Indefinite   Indications  PULMONARY EMBOLISM HX OF [V12.51] Encounter for long-term (current) use of anticoagulants [V58.61]      Anticoagulation Episode Summary   [facility] check location [facility]   Preferred lab    Send INR reminders to    Comments 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 The first she describes as "a blood clot in my leg"; the second she describes as "a blood clot in my lung". Only the PE is documented on her problem list as "PE, History of". States she has been on warfarin for "10 years".       PATIENT INSTRUCTIONS: Patient Instructions   Patient instructed to take medications as defined in the Anti-coagulation Track section of this encounter.  Patient instructed to take today's dose.  Patient verbalized understanding of these instructions.       FOLLOW-UP Return in 5 weeks (on 06/11/2012) for Follow up INR at 1045h.  Hulen Luster, III Pharm.D., CACP

## 2012-05-07 NOTE — Patient Instructions (Signed)
Patient instructed to take medications as defined in the Anti-coagulation Track section of this encounter.  Patient instructed to take today's dose.  Patient verbalized understanding of these instructions.    

## 2012-05-07 NOTE — Progress Notes (Signed)
Indication: Recurrent thromboembolism.  Duration: Lifelong.  INR at target.  Agree with Dr. Saralyn Pilar assessment and plan as documented.

## 2012-05-29 ENCOUNTER — Encounter: Payer: Self-pay | Admitting: Internal Medicine

## 2012-05-29 ENCOUNTER — Ambulatory Visit (HOSPITAL_BASED_OUTPATIENT_CLINIC_OR_DEPARTMENT_OTHER): Payer: Medicare Other | Admitting: Internal Medicine

## 2012-05-29 VITALS — BP 146/80 | HR 82 | Temp 98.5°F | Ht 66.0 in | Wt 285.4 lb

## 2012-05-29 DIAGNOSIS — Z0000 Encounter for general adult medical examination without abnormal findings: Secondary | ICD-10-CM

## 2012-05-29 DIAGNOSIS — I10 Essential (primary) hypertension: Secondary | ICD-10-CM

## 2012-05-29 DIAGNOSIS — J45909 Unspecified asthma, uncomplicated: Secondary | ICD-10-CM

## 2012-05-29 DIAGNOSIS — Z23 Encounter for immunization: Secondary | ICD-10-CM

## 2012-05-29 NOTE — Assessment & Plan Note (Signed)
-  We'll given pneumococcal and tetanus shots today. -Will give prescription for Zostavax today. -Patient refused colonoscopy. Will postpone

## 2012-05-29 NOTE — Patient Instructions (Signed)
1. You have done great job in taking all your medications. I appreciate it very much. Please continue doing that. 2. Please take all medications as prescribed.  3. If you have worsening of your symptoms or new symptoms arise, please call the clinic ([phone]), or go to the ER immediately if symptoms are severe.     

## 2012-05-29 NOTE — Assessment & Plan Note (Signed)
It is staple. Lung auscultation is clear bilaterally. No signs of exacerbation. Will continue current regimen.

## 2012-05-29 NOTE — Progress Notes (Signed)
Patient ID: Caroline [name], female   DOB: [birth year], 67 y.o.   MRN: [id number] Subjective:   Patient ID: Caroline [name] female   DOB: [birth year] 67 y.o.   MRN: [id number]  CC:   Follow up visit.    HPI:  [name] is a 67 y.o. lady with past medical history as outlined below, who presents for a followup visit today. Patient reports that she is taking all her medications regularly. She does not have a new complaints today. She feels good he generally.  1). Asthma: Patient is currently taking albuterol inhaler, Advair inhaler, and Singulair. She does not have cough, wheezing, shortness of breath or chest pain. She doesn't have signs of acute exacerbation.  2.) Hypertension: Patient is currently taking metoprolol and hydrochlorothiazide. Today her blood pressure is   3.) History for pulmonary embolism: Patient is currently taking Coumadin. Her last INR was 2.5 on 05/07/12. She does not have bleeding tendency. Patient does not have chest pain, shortness of breath, or signs of DVT. There is no tenderness over her calf areas bilaterally.  4.) Hyperlipidemia: Patient used to take pravastatin 40 mg daily. She did not noticed any side effects, such as muscle pain. Her last LDL was 130 at 09/13/11. His pravastatin dose was increased from 40 to 80 mg daily.   ROS: Denies fever, chills, fatigue, headaches,  cough, chest pain, SOB,  abdominal pain,diarrhea, constipation, dysuria, urgency, frequency, hematuria.      Past Medical History  Diagnosis Date  . Hypertension   . Diabetes mellitus   . Vertigo   . DVT, lower extremity, recurrent     Patient had unprovoked PE on 2002 and DVT in right lower extremety 2008.  . PE (pulmonary embolism)     Patient had unprovoked PE on 2002   Current Outpatient Prescriptions  Medication Sig Dispense Refill  . acetaminophen (TYLENOL) 500 MG tablet Take 500 mg by mouth every 6 (six) hours as needed. pain       . albuterol (PROVENTIL HFA;VENTOLIN HFA) 108 (90 BASE)  MCG/ACT inhaler Inhale 2 puffs into the lungs every 6 (six) hours as needed. SOB  1 Inhaler  5  . calcium citrate-vitamin D (CITRACAL+D) 315-200 MG-UNIT per tablet Take 1 tablet by mouth daily.      . fluticasone (FLONASE) 50 MCG/ACT nasal spray Place 2 sprays into the nose daily.  16 g  3  . Fluticasone-Salmeterol (ADVAIR) 500-50 MCG/DOSE AEPB Inhale 1 puff into the lungs every 12 (twelve) hours.  60 each  5  . guaiFENesin-codeine (ROBITUSSIN AC) 100-10 MG/5ML syrup Take 5 mLs by mouth 3 (three) times daily as needed for cough.  120 mL  0  . hydrochlorothiazide (HYDRODIURIL) 25 MG tablet Take 1 tablet (25 mg total) by mouth daily.  90 tablet  1  . meclizine (ANTIVERT) 25 MG tablet TAKE 1 TABLET BY MOUTH 3 TIMES A DAY AS NEEDED  90 tablet  1  . metoprolol tartrate (LOPRESSOR) 25 MG tablet Take 1 tablet (25 mg total) by mouth 2 (two) times daily.  120 tablet  3  . montelukast (SINGULAIR) 10 MG tablet Take 1 tablet (10 mg total) by mouth at bedtime.  30 tablet  5  . Multiple Vitamins-Minerals (CENTRUM SILVER ULTRA WOMENS) TABS Take 1 tablet by mouth daily.      [name] omeprazole (PRILOSEC) 20 MG capsule Take 20 mg by mouth daily.      . pravastatin (PRAVACHOL) 40 MG tablet TAKE 2 TABLETS BY MOUTH  ONCE DAILY.  60 tablet  5  . VENTOLIN HFA 108 (90 BASE) MCG/ACT inhaler INHALE 2 PUFFS INTO THE LUNGS EVERY 6 (SIX) HOURS AS NEEDED FOR SHORTNESS OF BREATH  18 each  5  . warfarin (COUMADIN) 4 MG tablet Take 1 tablet (4 mg total) by mouth daily. Tues,thursday,friday,sat,sun  60 tablet  1   No current facility-administered medications for this visit.   No family history on file. History   Social History  . Marital Status: Divorced    Spouse Name: N/A    Number of Children: N/A  . Years of Education: N/A   Social History Main Topics  . Smoking status: Never Smoker   . Smokeless tobacco: Not on file  . Alcohol Use: No  . Drug Use: No  . Sexually Active: Yes    Birth Control/ Protection: None   Other  Topics Concern  . Not on file   Social History Narrative  . No narrative on file    Review of Systems: As per HPI  Objective:  Physical Exam: There were no vitals filed for this visit. Constitutional: Vital signs reviewed.  Patient is a well-developed and well-nourished, in no acute distress and cooperative with exam.   HEENT:  Head: Normocephalic and atraumatic Ear: TM normal bilaterally Mouth: no erythema or exudates, MMM Eyes: PERRL, EOMI, conjunctivae normal, No scleral icterus.  Neck: Supple, Trachea midline normal ROM, No JVD, mass, thyromegaly, or carotid bruit present. No lymph node enlargement. Cardiovascular: RRR, S1 normal, S2 normal, no MRG, pulses symmetric and intact bilaterally Pulmonary/Chest: CTAB, no wheezes, rales, or rhonchi Abdominal: Soft. Non-tender, non-distended, bowel sounds are normal, no masses, organomegaly, or guarding present.  GU: no CVA tenderness Musculoskeletal: No joint deformities, erythema, or stiffness, ROM full and non-tender Hematology: no cervical, inginal, or axillary adenopathy.  Neurological: A&O x3, Strength is normal and symmetric bilaterally, cranial nerve II-XII are grossly intact, no focal motor deficit, sensory intact to light touch bilaterally. Brachial reflex 2+ bilaterally. Knee reflex 2+ bilaterally. Babinski's sign negative. Finger to nose test normal. Skin: Warm, dry and intact. No rash, cyanosis, or clubbing.  Psychiatric: Normal mood and affect. speech and behavior is normal. Judgment and thought content normal. Cognition and memory are normal.   Assessment & Plan:

## 2012-05-29 NOTE — Assessment & Plan Note (Signed)
BP Readings from Last 3 Encounters:  05/29/12 146/80  01/31/12 139/80  11/15/11 138/78    Lab Results  Component Value Date   NA 143 09/13/2011   K 4.0 09/13/2011   CREATININE 0.77 09/13/2011    Assessment: Blood pressure control: mildly elevated Progress toward BP goal:  deteriorated Comments:   Plan: Medications:  continue current medications Educational resources provided: brochure Self management tools provided: home blood pressure logbook Other plans: bp is slightly elevated at 146/80 mmHg. Will not change her regiment today. Will follow up.

## 2012-05-29 NOTE — Assessment & Plan Note (Signed)
No new issues. She is taking Coumadin regularly. Her INR was 2.5 on 05/07/12. Will continue current regimen.

## 2012-06-04 NOTE — Progress Notes (Signed)
INTERNAL MEDICINE TEACHING ATTENDING ADDENDUM: I discussed this case with Dr. Niu soon after the patient visit. I have read the documentation and I agree with the plan of care. Please see the resident note for details of management.    

## 2012-06-11 ENCOUNTER — Ambulatory Visit (INDEPENDENT_AMBULATORY_CARE_PROVIDER_SITE_OTHER): Payer: Medicare Other | Admitting: Pharmacist

## 2012-06-11 DIAGNOSIS — Z23 Encounter for immunization: Secondary | ICD-10-CM

## 2012-06-11 DIAGNOSIS — Z86718 Personal history of other venous thrombosis and embolism: Secondary | ICD-10-CM

## 2012-06-11 DIAGNOSIS — Z7901 Long term (current) use of anticoagulants: Secondary | ICD-10-CM

## 2012-06-11 NOTE — Patient Instructions (Signed)
Patient instructed to take medications as defined in the Anti-coagulation Track section of this encounter.  Patient instructed to take today's dose.  Patient verbalized understanding of these instructions.    

## 2012-06-11 NOTE — Progress Notes (Signed)
Anti-Coagulation Progress Note  Caroline [name] is a 67 y.o. female who is currently on an anti-coagulation regimen.    RECENT RESULTS: Recent results are below, the most recent result is correlated with a dose of 26 mg. per week: Lab Results  Component Value Date   INR 3.00 06/11/2012   INR 2.5 05/07/2012   INR 2.00 04/09/2012    ANTI-COAG DOSE: Anticoagulation Dose Instructions as of 06/11/2012     [name] Tue Wed Thu Fri Sat   New Dose 4 mg 2 mg 4 mg 4 mg 2 mg 4 mg 4 mg       ANTICOAG SUMMARY: Anticoagulation Episode Summary   Current INR goal 2.0-3.0  Next INR check 07/09/2012  INR from last check 2.5 (05/07/2012)  Most recent INR 3.00 (06/11/2012)  Weekly max dose   Target end date Indefinite  [facility] check location [facility]  Preferred lab   Send INR reminders to    Indications  PULMONARY EMBOLISM HX OF [V12.51] Encounter for long-term (current) use of anticoagulants [V58.61]        Comments 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 The first she describes as "a blood clot in my leg"; the second she describes as "a blood clot in my lung". Only the PE is documented on her problem list as "PE, History of". States she has been on warfarin for "10 years".         ANTICOAG TODAY: Anticoagulation Summary as of 06/11/2012   INR goal 2.0-3.0  Selected INR   Next INR check 07/09/2012  Target end date Indefinite   Indications  PULMONARY EMBOLISM HX OF [V12.51] Encounter for long-term (current) use of anticoagulants [V58.61]      Anticoagulation Episode Summary   [facility]R check location [facility]   Preferred lab    Send INR reminders to    Comments 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 The first she describes as "a blood clot in my leg"; the second she describes as "a blood clot in my lung". Only the PE is documented on her problem list as "PE, History of". States she has been on warfarin for "10 years".       PATIENT  INSTRUCTIONS: Patient Instructions  Patient instructed to take medications as defined in the Anti-coagulation Track section of this encounter.  Patient instructed to take today's dose.  Patient verbalized understanding of these instructions.       FOLLOW-UP Return in 4 weeks (on 07/09/2012) for Follow up INR at 1100h.  Hulen Luster, III Pharm.D., CACP

## 2012-07-09 ENCOUNTER — Ambulatory Visit (INDEPENDENT_AMBULATORY_CARE_PROVIDER_SITE_OTHER): Payer: Medicare Other | Admitting: Pharmacist

## 2012-07-09 DIAGNOSIS — Z86718 Personal history of other venous thrombosis and embolism: Secondary | ICD-10-CM

## 2012-07-09 DIAGNOSIS — Z7901 Long term (current) use of anticoagulants: Secondary | ICD-10-CM

## 2012-07-09 LAB — POCT INR: INR: 2

## 2012-07-09 NOTE — Patient Instructions (Signed)
Patient instructed to take medications as defined in the Anti-coagulation Track section of this encounter.  Patient instructed to take today's dose.  Patient verbalized understanding of these instructions.    

## 2012-07-09 NOTE — Progress Notes (Signed)
Anti-Coagulation Progress Note  Caroline [name] is a 67 y.o. female who is currently on an anti-coagulation regimen.    RECENT RESULTS: Recent results are below, the most recent result is correlated with a dose of 24 mg. per week: Lab Results  Component Value Date   INR 2.00 07/09/2012   INR 3.00 06/11/2012   INR 2.5 05/07/2012    ANTI-COAG DOSE: Anticoagulation Dose Instructions as of 07/09/2012     [name] Tue Wed Thu Fri Sat   New Dose 4 mg 4 mg 4 mg 2 mg 4 mg 4 mg 4 mg       ANTICOAG SUMMARY: Anticoagulation Episode Summary   Current INR goal 2.0-3.0  Next INR check 08/06/2012  INR from last check 2.00 (07/09/2012)  Weekly max dose   Target end date Indefinite  [facility] check location [facility]  Preferred lab   Send INR reminders to    Indications  PULMONARY EMBOLISM HX OF [V12.51] Encounter for long-term (current) use of anticoagulants [V58.61]        Comments 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 The first she describes as "a blood clot in my leg"; the second she describes as "a blood clot in my lung". Only the PE is documented on her problem list as "PE, History of". States she has been on warfarin for "10 years".         ANTICOAG TODAY: Anticoagulation Summary as of 07/09/2012   INR goal 2.0-3.0  Selected INR 2.00 (07/09/2012)  Next INR check 08/06/2012  Target end date Indefinite   Indications  PULMONARY EMBOLISM HX OF [V12.51] Encounter for long-term (current) use of anticoagulants [V58.61]      Anticoagulation Episode Summary   [facility] check location [facility]   Preferred lab    Send INR reminders to    Comments 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 The first she describes as "a blood clot in my leg"; the second she describes as "a blood clot in my lung". Only the PE is documented on her problem list as "PE, History of". States she has been on warfarin for "10 years".       PATIENT INSTRUCTIONS: Patient Instructions   Patient instructed to take medications as defined in the Anti-coagulation Track section of this encounter.  Patient instructed to take today's dose.  Patient verbalized understanding of these instructions.       FOLLOW-UP Return in 4 weeks (on 08/06/2012) for Follow up INR at 1100h.  Hulen Luster, III Pharm.D., CACP

## 2012-08-06 ENCOUNTER — Ambulatory Visit (INDEPENDENT_AMBULATORY_CARE_PROVIDER_SITE_OTHER): Payer: Medicare Other | Admitting: Pharmacist

## 2012-08-06 ENCOUNTER — Ambulatory Visit: Payer: Medicare Other

## 2012-08-06 DIAGNOSIS — Z86718 Personal history of other venous thrombosis and embolism: Secondary | ICD-10-CM

## 2012-08-06 DIAGNOSIS — Z7901 Long term (current) use of anticoagulants: Secondary | ICD-10-CM

## 2012-08-06 NOTE — Patient Instructions (Signed)
Patient instructed to take medications as defined in the Anti-coagulation Track section of this encounter.  Patient instructed to take today's dose.  Patient verbalized understanding of these instructions.    

## 2012-08-06 NOTE — Progress Notes (Signed)
Anti-Coagulation Progress Note  Caroline [name] is a 67 y.o. female who is currently on an anti-coagulation regimen.    RECENT RESULTS: Recent results are below, the most recent result is correlated with a dose of 26 mg. per week: Will remain in 26mg /week. Lab Results  Component Value Date   INR 2.70 08/06/2012   INR 2.00 07/09/2012   INR 3.00 06/11/2012    ANTI-COAG DOSE: Anticoagulation Dose Instructions as of 08/06/2012     [name] Tue Wed Thu Fri Sat   New Dose 4 mg 4 mg 4 mg 2 mg 4 mg 4 mg 4 mg       ANTICOAG SUMMARY: Anticoagulation Episode Summary   Current INR goal 2.0-3.0  Next INR check 09/03/2012  INR from last check 2.70 (08/06/2012)  Weekly max dose   Target end date Indefinite  [facility]R check location [facility]  Preferred lab   Send INR reminders to    Indications  PULMONARY EMBOLISM HX OF [V12.51] Encounter for long-term (current) use of anticoagulants [V58.61]        Comments 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 The first she describes as "a blood clot in my leg"; the second she describes as "a blood clot in my lung". Only the PE is documented on her problem list as "PE, History of". States she has been on warfarin for "10 years".         ANTICOAG TODAY: Anticoagulation Summary as of 08/06/2012   INR goal 2.0-3.0  Selected INR 2.70 (08/06/2012)  Next INR check 09/03/2012  Target end date Indefinite   Indications  PULMONARY EMBOLISM HX OF [V12.51] Encounter for long-term (current) use of anticoagulants [V58.61]      Anticoagulation Episode Summary   [facility]R check location [facility]   Preferred lab    Send INR reminders to    Comments 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 The first she describes as "a blood clot in my leg"; the second she describes as "a blood clot in my lung". Only the PE is documented on her problem list as "PE, History of". States she has been on warfarin for "10 years".       PATIENT  INSTRUCTIONS: Patient Instructions  Patient instructed to take medications as defined in the Anti-coagulation Track section of this encounter.  Patient instructed to take today's dose.  Patient verbalized understanding of these instructions.       FOLLOW-UP Return in 4 weeks (on 09/03/2012) for Follow up INR at 1100h.  Hulen Luster, III Pharm.D., CACP

## 2012-09-03 ENCOUNTER — Ambulatory Visit (INDEPENDENT_AMBULATORY_CARE_PROVIDER_SITE_OTHER): Payer: Medicare Other | Admitting: Pharmacist

## 2012-09-03 DIAGNOSIS — Z7901 Long term (current) use of anticoagulants: Secondary | ICD-10-CM

## 2012-09-03 DIAGNOSIS — Z86718 Personal history of other venous thrombosis and embolism: Secondary | ICD-10-CM

## 2012-09-03 NOTE — Progress Notes (Signed)
Anti-Coagulation Progress Note  Caroline [name] is a 67 y.o. female who is currently on an anti-coagulation regimen.    RECENT RESULTS: Recent results are below, the most recent result is correlated with a dose of 32.5 mg. per week: Lab Results  Component Value Date   INR 2.70 09/03/2012   INR 2.70 08/06/2012   INR 2.00 07/09/2012    ANTI-COAG DOSE: Anticoagulation Dose Instructions as of 09/03/2012     [name] Tue Wed Thu Fri Sat   New Dose 4 mg 4 mg 4 mg 2 mg 4 mg 4 mg 4 mg       ANTICOAG SUMMARY: Anticoagulation Episode Summary   Current INR goal 2.0-3.0  Next INR check 10/01/2012  INR from last check 2.70 (09/03/2012)  Weekly max dose   Target end date Indefinite  [facility]R check location [facility]  Preferred lab   Send INR reminders to    Indications  PULMONARY EMBOLISM HX OF [V12.51] Encounter for long-term (current) use of anticoagulants [V58.61]        Comments 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 The first she describes as "a blood clot in my leg"; the second she describes as "a blood clot in my lung". Only the PE is documented on her problem list as "PE, History of". States she has been on warfarin for "10 years".         ANTICOAG TODAY: Anticoagulation Summary as of 09/03/2012   INR goal 2.0-3.0  Selected INR 2.70 (09/03/2012)  Next INR check 10/01/2012  Target end date Indefinite   Indications  PULMONARY EMBOLISM HX OF [V12.51] Encounter for long-term (current) use of anticoagulants [V58.61]      Anticoagulation Episode Summary   [facility]R check location [facility]   Preferred lab    Send INR reminders to    Comments 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 The first she describes as "a blood clot in my leg"; the second she describes as "a blood clot in my lung". Only the PE is documented on her problem list as "PE, History of". States she has been on warfarin for "10 years".       PATIENT INSTRUCTIONS: Patient  Instructions  Patient instructed to take medications as defined in the Anti-coagulation Track section of this encounter.  Patient instructed to take today's dose.  Patient verbalized understanding of these instructions.       FOLLOW-UP Return in 4 weeks (on 10/01/2012) for Follow up INR at 1100h.  Hulen Luster, III Pharm.D., CACP

## 2012-09-03 NOTE — Patient Instructions (Signed)
Patient instructed to take medications as defined in the Anti-coagulation Track section of this encounter.  Patient instructed to take today's dose.  Patient verbalized understanding of these instructions.    

## 2012-09-11 ENCOUNTER — Other Ambulatory Visit: Payer: Self-pay | Admitting: Internal Medicine

## 2012-09-11 DIAGNOSIS — I10 Essential (primary) hypertension: Secondary | ICD-10-CM

## 2012-09-12 ENCOUNTER — Encounter: Payer: Self-pay | Admitting: Licensed Clinical Social Worker

## 2012-09-12 ENCOUNTER — Telehealth: Payer: Self-pay | Admitting: *Deleted

## 2012-09-12 ENCOUNTER — Other Ambulatory Visit: Payer: Self-pay | Admitting: Internal Medicine

## 2012-09-12 ENCOUNTER — Telehealth: Payer: Self-pay | Admitting: Internal Medicine

## 2012-09-12 DIAGNOSIS — J449 Chronic obstructive pulmonary disease, unspecified: Secondary | ICD-10-CM

## 2012-09-12 MED ORDER — BUDESONIDE-FORMOTEROL FUMARATE 80-4.5 MCG/ACT IN AERO: 2.0000 | INHALATION_SPRAY | Freq: Two times a day (BID) | RESPIRATORY_TRACT | Status: DC

## 2012-09-12 NOTE — Telephone Encounter (Signed)
Patient called clinic and reported that her insurance will no longer cover her Advair any more. I had switched her Advair to Symbicort. I called the patient to have informed her that I sent the prescription to her pharmacy already.   Lorretta Harp, MD PGY3, Internal Medicine Teaching Service Pager: [phone]

## 2012-09-12 NOTE — Telephone Encounter (Signed)
Pt calls and would like for you to call her, she states that her insurance will not pay for advair anymore but will pay for symbicort and would like for you to call her to discuss this, ph# [phone]

## 2012-09-12 NOTE — Progress Notes (Signed)
Patient ID: Caroline [name], female   DOB: 10-17-1945, 67 y.o.   MRN: [id number] Pt placed call to [facility] requesting Handicap Placard Application.  CSW initiated form and placed in PCP mailbox.

## 2012-09-18 ENCOUNTER — Encounter: Payer: Self-pay | Admitting: Internal Medicine

## 2012-09-18 DIAGNOSIS — Z86718 Personal history of other venous thrombosis and embolism: Secondary | ICD-10-CM

## 2012-09-18 DIAGNOSIS — Z7901 Long term (current) use of anticoagulants: Secondary | ICD-10-CM

## 2012-09-26 ENCOUNTER — Encounter (HOSPITAL_COMMUNITY): Payer: Self-pay | Admitting: Emergency Medicine

## 2012-09-26 ENCOUNTER — Emergency Department (HOSPITAL_COMMUNITY)
Admission: EM | Admit: 2012-09-26 | Discharge: 2012-09-26 | Disposition: A | Discharge status: Home / Self Care | Payer: Medicare Other | Attending: Emergency Medicine | Admitting: Emergency Medicine

## 2012-09-26 DIAGNOSIS — Z880 Allergy status to penicillin: Secondary | ICD-10-CM | POA: Insufficient documentation

## 2012-09-26 DIAGNOSIS — R61 Generalized hyperhidrosis: Secondary | ICD-10-CM | POA: Insufficient documentation

## 2012-09-26 DIAGNOSIS — R197 Diarrhea, unspecified: Secondary | ICD-10-CM

## 2012-09-26 DIAGNOSIS — E119 Type 2 diabetes mellitus without complications: Secondary | ICD-10-CM | POA: Insufficient documentation

## 2012-09-26 DIAGNOSIS — IMO0002 Reserved for concepts with insufficient information to code with codable children: Secondary | ICD-10-CM | POA: Insufficient documentation

## 2012-09-26 DIAGNOSIS — R110 Nausea: Secondary | ICD-10-CM | POA: Insufficient documentation

## 2012-09-26 DIAGNOSIS — Z79899 Other long term (current) drug therapy: Secondary | ICD-10-CM | POA: Insufficient documentation

## 2012-09-26 DIAGNOSIS — Z86718 Personal history of other venous thrombosis and embolism: Secondary | ICD-10-CM | POA: Insufficient documentation

## 2012-09-26 DIAGNOSIS — I10 Essential (primary) hypertension: Secondary | ICD-10-CM | POA: Insufficient documentation

## 2012-09-26 DIAGNOSIS — R509 Fever, unspecified: Secondary | ICD-10-CM | POA: Insufficient documentation

## 2012-09-26 DIAGNOSIS — Z7901 Long term (current) use of anticoagulants: Secondary | ICD-10-CM | POA: Insufficient documentation

## 2012-09-26 DIAGNOSIS — Z86711 Personal history of pulmonary embolism: Secondary | ICD-10-CM | POA: Insufficient documentation

## 2012-09-26 DIAGNOSIS — R5381 Other malaise: Secondary | ICD-10-CM | POA: Insufficient documentation

## 2012-09-26 LAB — URINALYSIS, ROUTINE W REFLEX MICROSCOPIC
Bilirubin Urine: NEGATIVE
Ketones, ur: NEGATIVE mg/dL
Nitrite: NEGATIVE
pH: 6.5 (ref 5.0–8.0)

## 2012-09-26 LAB — COMPREHENSIVE METABOLIC PANEL
ALT: 30 U/L (ref 0–35)
Alkaline Phosphatase: 78 U/L (ref 39–117)
CO2: 28 mEq/L (ref 19–32)
Chloride: 100 mEq/L (ref 96–112)
GFR calc Af Amer: >90 mL/min (ref >90)
Glucose, Bld: 91 mg/dL (ref 70–99)
Potassium: 3 mEq/L — ABNORMAL LOW (ref 3.5–5.1)
Sodium: 140 mEq/L (ref 135–145)
Total Bilirubin: 0.2 mg/dL — ABNORMAL LOW (ref 0.3–1.2)
Total Protein: 8 g/dL (ref 6.0–8.3)

## 2012-09-26 LAB — CBC WITH DIFFERENTIAL/PLATELET
Eosinophils Absolute: 0.1 10*3/uL (ref 0.0–0.7)
Hemoglobin: 12 g/dL (ref 12.0–15.0)
Lymphocytes Relative: 43 % (ref 12–46)
Lymphs Abs: 2.7 10*3/uL (ref 0.7–4.0)
Neutro Abs: 2.9 10*3/uL (ref 1.7–7.7)
Neutrophils Relative %: 47 % (ref 43–77)
Platelets: 245 10*3/uL (ref 150–400)
RBC: 4.6 MIL/uL (ref 3.87–5.11)
WBC: 6.2 10*3/uL (ref 4.0–10.5)

## 2012-09-26 LAB — URINE MICROSCOPIC-ADD ON

## 2012-09-26 LAB — PROTIME-INR: INR: 2.69 — ABNORMAL HIGH (ref 0.00–1.49)

## 2012-09-26 MED ORDER — PROMETHAZINE HCL 25 MG PO TABS: 25.0000 mg | ORAL_TABLET | Freq: Four times a day (QID) | ORAL | Status: DC | PRN

## 2012-09-26 MED ORDER — POTASSIUM CHLORIDE CRYS ER 20 MEQ PO TBCR
40.0000 meq | EXTENDED_RELEASE_TABLET | Freq: Once | ORAL | Status: AC
  Administered 2012-09-26: 40 meq via ORAL
  Filled 2012-09-26: qty 2

## 2012-09-26 NOTE — ED Provider Notes (Signed)
CSN: [id number]     Arrival date & time 09/26/12  1444 History   First MD Initiated Contact with Patient 09/26/12 1630     Chief Complaint  Patient presents with  . Abdominal Pain  . Fever  . Diarrhea   (Consider location/radiation/quality/duration/timing/severity/associated sxs/prior Treatment) HPI  Caroline [name] is a 67 y.o. female with past medical history significant for asthma, previous DVT chronically anticoagulated with Coumadin - presenting with nausea, fatigue, diarrhea, bilateral lower abdominal pain x 1 week.  Abdominal pain is now resolved. At its worst it was 10 out of 10, lasted for several hours and located in the bilateral lower quadrants, Lower abdominal pain feels stabbing, intermittent (not currently in pain). Patient states that it is coming less frequently is much less severe and lasts for a period of time he is generally resolving.  Describes diarrhea as loose, watery - non-bloody, with no mucus, with 3-4 episodes per day.  Also endorses subjective fever, diaphoresis, weakness, fatigue.  Denies SOB, CP, dysuria, urinary frequency, abnormal vaginal discharge.  Believes this is related to recent medication switch (Advair to Symbicort) on 9/3.  Course is intermittent.  Heating pad and prilosec help abdominal pain.  Nothing makes it worse.   Past Medical History  Diagnosis Date  . Hypertension   . Diabetes mellitus   . Vertigo   . DVT, lower extremity, recurrent     Patient had unprovoked PE on 2002 and DVT in right lower extremety 2008.  . PE (pulmonary embolism)     Patient had unprovoked PE on 2002   History reviewed. No pertinent past surgical history. No family history on file. History  Substance Use Topics  . Smoking status: Never Smoker   . Smokeless tobacco: Not on file  . Alcohol Use: No   OB History   Grav Para Term Preterm Abortions TAB SAB Ect Mult Living                 Review of Systems 10 systems reviewed and found to be negative, except as noted  in the HPI  Allergies  Penicillins; Shellfish allergy; and Tomato  Home Medications   Current Outpatient Rx  Name  Route  Sig  Dispense  Refill  . acetaminophen (TYLENOL) 500 MG tablet   Oral   Take 500 mg by mouth every 6 (six) hours as needed. pain          . albuterol (PROVENTIL HFA;VENTOLIN HFA) 108 (90 BASE) MCG/ACT inhaler   Inhalation   Inhale 2 puffs into the lungs every 6 (six) hours as needed. SOB   1 Inhaler   5   . budesonide-formoterol (SYMBICORT) 80-4.5 MCG/ACT inhaler   Inhalation   Inhale 2 puffs into the lungs 2 (two) times daily.   1 Inhaler   11   . calcium citrate-vitamin D (CITRACAL+D) 315-200 MG-UNIT per tablet   Oral   Take 1 tablet by mouth daily.         . fluticasone (FLONASE) 50 MCG/ACT nasal spray   Nasal   Place 2 sprays into the nose daily.   16 g   3   . guaiFENesin-codeine (ROBITUSSIN AC) 100-10 MG/5ML syrup   Oral   Take 5 mLs by mouth 3 (three) times daily as needed for cough.   120 mL   0   . hydrochlorothiazide (HYDRODIURIL) 25 MG tablet      TAKE 1 TABLET (25 MG TOTAL) BY MOUTH DAILY.   90 tablet   3   .  meclizine (ANTIVERT) 25 MG tablet      TAKE 1 TABLET BY MOUTH 3 TIMES A DAY AS NEEDED   90 tablet   1   . metoprolol tartrate (LOPRESSOR) 25 MG tablet   Oral   Take 1 tablet (25 mg total) by mouth 2 (two) times daily.   120 tablet   3   . montelukast (SINGULAIR) 10 MG tablet   Oral   Take 1 tablet (10 mg total) by mouth at bedtime.   30 tablet   5   . Multiple Vitamins-Minerals (CENTRUM SILVER ULTRA WOMENS) TABS   Oral   Take 1 tablet by mouth daily.         [name] omeprazole (PRILOSEC) 20 MG capsule   Oral   Take 20 mg by mouth daily.         . pravastatin (PRAVACHOL) 40 MG tablet      TAKE 2 TABLETS BY MOUTH ONCE DAILY.   60 tablet   5   . VENTOLIN HFA 108 (90 BASE) MCG/ACT inhaler      INHALE 2 PUFFS INTO THE LUNGS EVERY 6 (SIX) HOURS AS NEEDED FOR SHORTNESS OF BREATH   18 each   5   .  warfarin (COUMADIN) 4 MG tablet   Oral   Take 1 tablet (4 mg total) by mouth daily. Tues,thursday,friday,sat,sun   60 tablet   1    BP 159/74  Pulse 82  Temp(Src) 98.3 F (36.8 C)  Resp 16  SpO2 97% Physical Exam  Nursing note and vitals reviewed. Constitutional: She is oriented to person, place, and time. She appears well-developed and well-nourished. No distress.  HENT:  Head: Normocephalic.  Mouth/Throat: Oropharynx is clear and moist.  Eyes: Conjunctivae and EOM are normal. Pupils are equal, round, and reactive to light.  Cardiovascular: Normal rate and regular rhythm.   Pulmonary/Chest: Effort normal and breath sounds normal. No stridor. No respiratory distress. She has no wheezes. She has no rales. She exhibits no tenderness.  Abdominal: Soft. Bowel sounds are normal. She exhibits no distension and no mass. There is no tenderness. There is no rebound and no guarding.  Musculoskeletal: Normal range of motion.  Neurological: She is alert and oriented to person, place, and time.  Psychiatric: She has a normal mood and affect.    ED Course  Procedures (including critical care time) Labs Review Labs Reviewed  COMPREHENSIVE METABOLIC PANEL - Abnormal; Notable for the following:    Potassium 3.0 (*)    Albumin 3.4 (*)    Total Bilirubin 0.2 (*)    GFR calc non Af Amer 88 (*)    All other components within normal limits  URINALYSIS, ROUTINE W REFLEX MICROSCOPIC - Abnormal; Notable for the following:    Hgb urine dipstick TRACE (*)    Leukocytes, UA TRACE (*)    All other components within normal limits  PROTIME-INR - Abnormal; Notable for the following:    Prothrombin Time 27.7 (*)    INR 2.69 (*)    All other components within normal limits  URINE MICROSCOPIC-ADD ON - Abnormal; Notable for the following:    Squamous Epithelial / LPF FEW (*)    All other components within normal limits  CBC WITH DIFFERENTIAL  LIPASE, BLOOD  GI PATHOGEN PANEL BY PCR, STOOL   Imaging  Review No results found.  MDM   1. Diarrhea      Filed Vitals:   09/26/12 1510 09/26/12 1512 09/26/12 1900 09/26/12 1912  BP:  159/74  141/69  Pulse: 82  64   Temp: 98.3 F (36.8 C)     Resp: 16     SpO2: 97%  98%      Caroline [name] is a 67 y.o. female presenting today with fatigue and nausea. Patient states that she has had intermittent bilateral lower abdominal pain for the last 7 days it is improving it is associated with nonbloody, non-melanotic diarrhea, 3-4 episodes a day of watery stool. Patient's abdominal exam is completely benign with no tenderness to palpation. She is tolerating by mouth at this time. UA is unremarkable, blood work is also within normal limits except for a mild hypokalemia at 3.0. Patient is anticoagulated effectively with an INR of 2.69. Serial abdominal exams are nonsurgical.  Considering that she's had diarrhea for 7 days I think it is reasonable to perform further testing, GI pathogen by PCR ordered.  This is a shared visit with the attending physician who personally evaluated the patient and agrees with the care plan.    Medications  potassium chloride SA (K-DUR,KLOR-CON) CR tablet 40 mEq (not administered)    Pt is hemodynamically stable, appropriate for, and amenable to discharge at this time. Pt verbalized understanding and agrees with care plan. All questions answered. Outpatient follow-up and specific return precautions discussed.    Discharge Medication List as of 09/26/2012  7:10 PM    START taking these medications   Details  promethazine (PHENERGAN) 25 MG tablet Take 1 tablet (25 mg total) by mouth every 6 (six) hours as needed for nausea., Starting 09/26/2012, Until Discontinued, Print        Note: Portions of this report may have been transcribed using voice recognition software. Every effort was made to ensure accuracy; however, inadvertent computerized transcription errors may be present      Wynetta Emery, PA-C 09/26/12  2042

## 2012-09-26 NOTE — ED Notes (Signed)
abd pain diarrhea and fever  Started last thursdaty

## 2012-09-27 NOTE — ED Provider Notes (Signed)
Medical screening examination/treatment/procedure(s) were conducted as a shared visit with non-physician practitioner(s) and myself.  I personally evaluated the patient during the encounter Patient presents with diarrhea and abdominal pain. She has no significant abdominal tenderness on exam. She has no fevers or other systemic signs of illness.  Rolan Bucco, MD 09/27/12 2351

## 2012-10-01 ENCOUNTER — Ambulatory Visit (INDEPENDENT_AMBULATORY_CARE_PROVIDER_SITE_OTHER): Payer: Medicare Other | Admitting: Pharmacist

## 2012-10-01 DIAGNOSIS — Z7901 Long term (current) use of anticoagulants: Secondary | ICD-10-CM

## 2012-10-01 NOTE — Progress Notes (Signed)
Patient instructed to take medications as defined in the Anti-coagulation Track section of this encounter.  Patient instructed to take today's dose.  Patient verbalized understanding of these instructions.    

## 2012-10-22 ENCOUNTER — Ambulatory Visit (INDEPENDENT_AMBULATORY_CARE_PROVIDER_SITE_OTHER): Payer: Medicare Other | Admitting: Pharmacist

## 2012-10-22 DIAGNOSIS — Z86718 Personal history of other venous thrombosis and embolism: Secondary | ICD-10-CM

## 2012-10-22 DIAGNOSIS — Z7901 Long term (current) use of anticoagulants: Secondary | ICD-10-CM

## 2012-10-22 NOTE — Progress Notes (Signed)
Anti-Coagulation Progress Note  Caroline [name] is a 67 y.o. female who is currently on an anti-coagulation regimen.    RECENT RESULTS: Recent results are below, the most recent result is correlated with a dose of 26 mg. per week: Lab Results  Component Value Date   INR 1.90 10/22/2012   INR 2.69* 09/26/2012   INR 2.70 09/03/2012    ANTI-COAG DOSE: Anticoagulation Dose Instructions as of 10/22/2012     [name] Tue Wed Thu Fri Sat   New Dose 4 mg 4 mg 4 mg 4 mg 4 mg 4 mg 4 mg       ANTICOAG SUMMARY: Anticoagulation Episode Summary   Current INR goal 2.0-3.0  Next INR check 11/19/2012  INR from last check 1.90! (10/22/2012)  Weekly max dose   Target end date Indefinite  [facility] check [facility]  Preferred lab   Send INR reminders to    Indications  PULMONARY EMBOLISM HX OF [V12.51] Encounter for long-term (current) use of anticoagulants [V58.61]        Comments 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 The first she describes as "a blood clot in my leg"; the second she describes as "a blood clot in my lung". Only the PE is documented on her problem list as "PE, History of". States she has been on warfarin for "10 years".         ANTICOAG TODAY: Anticoagulation Summary as of 10/22/2012   INR goal 2.0-3.0  Selected INR 1.90! (10/22/2012)  Next INR check 11/19/2012  Target end date Indefinite   Indications  PULMONARY EMBOLISM HX OF [V12.51] Encounter for long-term (current) use of anticoagulants [V58.61]      Anticoagulation Episode Summary   [facility]   Preferred lab    Send INR reminders to    Comments 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 The first she describes as "a blood clot in my leg"; the second she describes as "a blood clot in my lung". Only the PE is documented on her problem list as "PE, History of". States she has been on warfarin for "10 years".       PATIENT INSTRUCTIONS: Patient  Instructions  Patient instructed to take medications as defined in the Anti-coagulation Track section of this encounter.  Patient instructed to take today's dose.  Patient verbalized understanding of these instructions.       FOLLOW-UP Return in 4 weeks (on 11/19/2012) for Follow up INR at 1100h.  Hulen Luster, III Pharm.D., CACP

## 2012-10-22 NOTE — Progress Notes (Signed)
Indication: Recurrent thromboembolism. Duration: Lifelong. INR: Below target. Agree with Dr. Groce's assessment and plan. 

## 2012-10-22 NOTE — Patient Instructions (Signed)
Patient instructed to take medications as defined in the Anti-coagulation Track section of this encounter.  Patient instructed to take today's dose.  Patient verbalized understanding of these instructions.    

## 2012-10-28 ENCOUNTER — Other Ambulatory Visit: Payer: Self-pay | Admitting: Internal Medicine

## 2012-10-28 DIAGNOSIS — E785 Hyperlipidemia, unspecified: Secondary | ICD-10-CM

## 2012-10-28 DIAGNOSIS — R42 Dizziness and giddiness: Secondary | ICD-10-CM

## 2012-10-29 ENCOUNTER — Ambulatory Visit: Payer: Medicare Other

## 2012-10-29 ENCOUNTER — Telehealth: Payer: Self-pay | Admitting: *Deleted

## 2012-10-29 NOTE — Telephone Encounter (Signed)
Pt calls and states her asthma is "acting up" and she needs to be seen trying to follow directions and using saline in her nebulizer but its getting worse. appt given for 10/21 dr brown

## 2012-10-30 ENCOUNTER — Ambulatory Visit (INDEPENDENT_AMBULATORY_CARE_PROVIDER_SITE_OTHER): Payer: Medicare Other | Admitting: Internal Medicine

## 2012-10-30 ENCOUNTER — Encounter: Payer: Self-pay | Admitting: Internal Medicine

## 2012-10-30 VITALS — BP 166/88 | HR 80 | Temp 97.8°F | Ht 60.0 in | Wt 285.3 lb

## 2012-10-30 DIAGNOSIS — I10 Essential (primary) hypertension: Secondary | ICD-10-CM

## 2012-10-30 DIAGNOSIS — J45901 Unspecified asthma with (acute) exacerbation: Secondary | ICD-10-CM

## 2012-10-30 MED ORDER — PREDNISONE 10 MG PO TABS: ORAL_TABLET | ORAL | Status: DC

## 2012-10-30 MED ORDER — ALBUTEROL SULFATE (5 MG/ML) 0.5% IN NEBU
2.5000 mg | INHALATION_SOLUTION | Freq: Once | RESPIRATORY_TRACT | Status: AC
  Administered 2012-10-30: 2.5 mg via RESPIRATORY_TRACT

## 2012-10-30 NOTE — Patient Instructions (Addendum)
General Instructions: Your symptoms are likely due to an asthma exacerbation. Take Prednisone, a steroid medication, as follows: Tuesday, October 21: Take 4 tablets Wednesday: Take 4 tablets Thursday: Take 3 tablets Friday: Take 3 tablets Saturday: Take 2 tablets Sunday: Take 2 tablets Monday: Take 1 tablet Tuesday: Take 1 tablet  Please return for a blood pressure recheck in 3-4 weeks.  Treatment Goals:  Goals (1 Years of Data) as of 10/30/12   None      Progress Toward Treatment Goals:  Treatment Goal 10/30/2012  Blood pressure deteriorated    Self Care Goals & Plans:  Self Care Goal 10/30/2012  Manage my medications take my medicines as prescribed; bring my medications to every visit; refill my medications on time; follow the sick day instructions if I am sick  Eat healthy foods drink diet soda or water instead of juice or soda; eat foods that are low in salt; eat baked foods instead of fried foods    No flowsheet data found.   Care Management & Community Referrals:  Referral 10/30/2012  Referrals made for care management support none needed

## 2012-10-30 NOTE — Progress Notes (Signed)
Case discussed with Dr. Brown at the time of the visit.  We reviewed the resident's history and exam and pertinent patient test results.  I agree with the assessment, diagnosis, and plan of care documented in the resident's note. 

## 2012-10-30 NOTE — Assessment & Plan Note (Signed)
BP Readings from Last 3 Encounters:  10/30/12 166/88  09/26/12 141/69  05/29/12 146/80    Lab Results  Component Value Date   NA 140 09/26/2012   K 3.0* 09/26/2012   CREATININE 0.72 09/26/2012    Assessment: Blood pressure control: moderately elevated Progress toward BP goal:  deteriorated Comments: the patient's blood pressure is elevated today, though I suspect this is related to her acute asthma exacerbation. The patient is on a beta blocker, but she only notes asthma exacerbations once every few years, so I doubt this is playing a role in her current symptoms. We'll keep her medications the same, and have her come back in 3-4 weeks for blood pressure recheck  Plan: Medications:  continue current medications Educational resources provided: brochure Self management tools provided: home blood pressure logbook Other plans: Recheck in 3-4 weeks

## 2012-10-30 NOTE — Assessment & Plan Note (Signed)
The patient notes a one-week history of dyspnea, wheeze, increase albuterol usage. Physical exam reveals bilateral wheezing, though peak flow is greater than 80% expected, indicating a mild asthma exacerbation. -Treat with prednisone taper over 8 days (40 x2, 30 x2, 20 x2, 10 x2) -Continue albuterol usage at home -Albuterol nebulizer treatment given here today -Patient to return in 3-4 weeks for recheck

## 2012-10-30 NOTE — Progress Notes (Signed)
HPI The patient is a 67 y.o. female with a history of HTN, asthma, presenting for an acute visit for dyspnea.  The patient notes a 1-week history of dyspnea, wheezing, cough productive of grey/white sputum, diaphoresis, back ache (now improved), subjective fevers (hasn't taken temperature) and chills.  Symptoms feel similar to her prior asthma exacerbations, though her last exacerbation was 2-3 years ago.  She has taken steroids in the past, but has never been intubated.  She has been using symbicort daily (pt doesn't like it, makes her throat feel "funny").  She has increased her albuterol usage to 3-4 times/day, from rare usage.  The patient's BP is elevated today.  She notes taking her regular BP medications this morning.  ROS: General: no changes in weight, changes in appetite Skin: no rash HEENT: no blurry vision, hearing changes, sore throat Pulm: see HPI CV: no chest pain, palpitations Abd: no abdominal pain, nausea/vomiting, diarrhea/constipation GU: no dysuria, hematuria, polyuria Ext: no arthralgias, myalgias Neuro: no weakness, numbness, or tingling  Filed Vitals:   10/30/12 0953  BP: 166/88  Pulse: 80  Temp: 97.8 F (36.6 C)    PEX General: alert, cooperative, appears mildly anxious HEENT: pupils equal round and reactive to light, vision grossly intact, oropharynx clear and non-erythematous  Neck: supple, no lymphadenopathy Lungs: mildly increased work of respiration. End-expiratory wheeze heard throughout all lung fields bilaterally. Heart: regular rate and rhythm, no murmurs, gallops, or rubs Abdomen: soft, non-tender, non-distended, normal bowel sounds Extremities: no cyanosis, clubbing, or edema Neurologic: alert & oriented X3, cranial nerves II-XII intact, strength grossly intact, sensation intact to light touch  Peak flow readings: 350, 375, 375  (Expected: 406)  Current Outpatient Prescriptions on File Prior to Visit  Medication Sig Dispense Refill  .  acetaminophen (TYLENOL) 500 MG tablet Take 500 mg by mouth every 6 (six) hours as needed. pain       . albuterol (PROVENTIL HFA;VENTOLIN HFA) 108 (90 BASE) MCG/ACT inhaler Inhale 2 puffs into the lungs every 6 (six) hours as needed. SOB  1 Inhaler  5  . budesonide-formoterol (SYMBICORT) 80-4.5 MCG/ACT inhaler Inhale 2 puffs into the lungs 2 (two) times daily.  1 Inhaler  11  . calcium citrate-vitamin D (CITRACAL+D) 315-200 MG-UNIT per tablet Take 1 tablet by mouth daily.      . fluticasone (FLONASE) 50 MCG/ACT nasal spray Place 2 sprays into the nose daily.  16 g  3  . guaiFENesin-codeine (ROBITUSSIN AC) 100-10 MG/5ML syrup Take 5 mLs by mouth 3 (three) times daily as needed for cough.  120 mL  0  . hydrochlorothiazide (HYDRODIURIL) 25 MG tablet TAKE 1 TABLET (25 MG TOTAL) BY MOUTH DAILY.  90 tablet  3  . meclizine (ANTIVERT) 25 MG tablet TAKE 1 TABLET BY MOUTH 3 TIMES A DAY AS NEEDED  90 tablet  1  . metoprolol tartrate (LOPRESSOR) 25 MG tablet Take 1 tablet (25 mg total) by mouth 2 (two) times daily.  120 tablet  3  . montelukast (SINGULAIR) 10 MG tablet Take 1 tablet (10 mg total) by mouth at bedtime.  30 tablet  5  . Multiple Vitamins-Minerals (CENTRUM SILVER ULTRA WOMENS) TABS Take 1 tablet by mouth daily.      [name] omeprazole (PRILOSEC) 20 MG capsule Take 20 mg by mouth daily.      . pravastatin (PRAVACHOL) 40 MG tablet TAKE 2 TABLETS BY MOUTH ONCE DAILY.  60 tablet  5  . promethazine (PHENERGAN) 25 MG tablet Take 1 tablet (25  mg total) by mouth every 6 (six) hours as needed for nausea.  12 tablet  0  . warfarin (COUMADIN) 4 MG tablet Take 2 mg by mouth See admin instructions. Take 2 mg on wednesdays      . warfarin (COUMADIN) 6 MG tablet Take 6 mg by mouth daily. Takes 6 mg mon,tues,thurs,fri,sat sunday       No current facility-administered medications on file prior to visit.    Assessment/Plan

## 2012-11-06 ENCOUNTER — Other Ambulatory Visit: Payer: Self-pay | Admitting: Internal Medicine

## 2012-11-06 DIAGNOSIS — E785 Hyperlipidemia, unspecified: Secondary | ICD-10-CM

## 2012-11-19 ENCOUNTER — Ambulatory Visit (INDEPENDENT_AMBULATORY_CARE_PROVIDER_SITE_OTHER): Payer: Medicare Other | Admitting: Pharmacist

## 2012-11-19 DIAGNOSIS — Z7901 Long term (current) use of anticoagulants: Secondary | ICD-10-CM

## 2012-11-19 DIAGNOSIS — Z86718 Personal history of other venous thrombosis and embolism: Secondary | ICD-10-CM

## 2012-11-19 NOTE — Patient Instructions (Signed)
Patient instructed to take medications as defined in the Anti-coagulation Track section of this encounter.  Patient instructed to take today's dose.  Patient verbalized understanding of these instructions.    

## 2012-11-19 NOTE — Progress Notes (Signed)
Anti-Coagulation Progress Note  [name] is a 67 y.o. female who is currently on an anti-coagulation regimen.    RECENT RESULTS: Recent results are below, the most recent result is correlated with a dose of 28 mg. per week: Lab Results  Component Value Date   INR 2.90 11/19/2012   INR 1.90 10/22/2012   INR 2.69* 09/26/2012    ANTI-COAG DOSE: Anticoagulation Dose Instructions as of 11/19/2012     [name] Tue Wed Thu Fri Sat   New Dose 4 mg 2 mg 4 mg 4 mg 4 mg 4 mg 4 mg       ANTICOAG SUMMARY: Anticoagulation Episode Summary   Current INR goal 2.0-3.0  Next INR check 12/03/2012  INR from last check 2.90 (11/19/2012)  Weekly max dose   Target end date Indefinite  [facility] check location [facility]  Preferred lab   Send INR reminders to    Indications  PULMONARY EMBOLISM HX OF [V12.51] Encounter for long-term (current) use of anticoagulants [V58.61]        Comments 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 The first she describes as "a blood clot in my leg"; the second she describes as "a blood clot in my lung". Only the PE is documented on her problem list as "PE, History of". States she has been on warfarin for "10 years".         ANTICOAG TODAY: Anticoagulation Summary as of 11/19/2012   INR goal 2.0-3.0  Selected INR 2.90 (11/19/2012)  Next INR check 12/03/2012  Target end date Indefinite   Indications  PULMONARY EMBOLISM HX OF [V12.51] Encounter for long-term (current) use of anticoagulants [V58.61]      Anticoagulation Episode Summary   [facility]   Preferred lab    Send INR reminders to    Comments 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 The first she describes as "a blood clot in my leg"; the second she describes as "a blood clot in my lung". Only the PE is documented on her problem list as "PE, History of". States she has been on warfarin for "10 years".       PATIENT INSTRUCTIONS: Patient  Instructions  Patient instructed to take medications as defined in the Anti-coagulation Track section of this encounter.  Patient instructed to take today's dose.  Patient verbalized understanding of these instructions.       FOLLOW-UP Return in 2 weeks (on 12/03/2012) for Follow up INR at 1045h.  Hulen Luster, III Pharm.D., CACP

## 2012-11-20 ENCOUNTER — Encounter: Payer: Self-pay | Admitting: Internal Medicine

## 2012-11-20 ENCOUNTER — Ambulatory Visit (INDEPENDENT_AMBULATORY_CARE_PROVIDER_SITE_OTHER): Payer: Medicare Other | Admitting: Internal Medicine

## 2012-11-20 ENCOUNTER — Other Ambulatory Visit: Payer: Self-pay | Admitting: Internal Medicine

## 2012-11-20 VITALS — BP 139/80 | HR 85 | Temp 98.0°F | Ht 60.25 in | Wt 285.5 lb

## 2012-11-20 DIAGNOSIS — Z0000 Encounter for general adult medical examination without abnormal findings: Secondary | ICD-10-CM

## 2012-11-20 DIAGNOSIS — J45909 Unspecified asthma, uncomplicated: Secondary | ICD-10-CM

## 2012-11-20 DIAGNOSIS — I10 Essential (primary) hypertension: Secondary | ICD-10-CM

## 2012-11-20 DIAGNOSIS — E785 Hyperlipidemia, unspecified: Secondary | ICD-10-CM

## 2012-11-20 DIAGNOSIS — E876 Hypokalemia: Secondary | ICD-10-CM

## 2012-11-20 DIAGNOSIS — Z23 Encounter for immunization: Secondary | ICD-10-CM

## 2012-11-20 MED ORDER — METOPROLOL TARTRATE 25 MG PO TABS: 25.0000 mg | ORAL_TABLET | Freq: Two times a day (BID) | ORAL | Status: DC

## 2012-11-20 NOTE — Progress Notes (Signed)
Indication: Recurrent venous thromboembolism. Duration: Lifelong. INR: At target. Agree with Dr. Groce's assessment and plan. 

## 2012-11-20 NOTE — Assessment & Plan Note (Signed)
Her last LDL was 130 at 09/13/11. His pravastatin dose was increased from 40 to 80 mg daily. No recent lipid profile checked. Her LFT was normal on 09/26/12.  -will check lipid profile today.

## 2012-11-20 NOTE — Patient Instructions (Signed)
1. You have done great job in taking all your medications. I appreciate it very much. Please continue doing that. 2. Please take all medications as prescribed.  3. If you have worsening of your symptoms or new symptoms arise, please call the clinic ([phone]), or go to the ER immediately if symptoms are severe.     

## 2012-11-20 NOTE — Assessment & Plan Note (Signed)
BP Readings from Last 3 Encounters:  11/20/12 139/80  10/30/12 166/88  09/26/12 141/69    Lab Results  Component Value Date   NA 140 09/26/2012   K 3.0* 09/26/2012   CREATININE 0.72 09/26/2012    Assessment: Blood pressure control: controlled Progress toward BP goal:  at goal Comments:   Plan: Medications:  continue current medications Educational resources provided: brochure;handout;video Self management tools provided:   Other plans: Will continue metoprolol and hydrochlorothiazide. Patient's potassium level was a 3.0 on 09/26/12, which was repleted with 40 mEq of potassium chloride. I forgot to check her BMP today, I will try to add-on BMP to other tests.

## 2012-11-20 NOTE — Progress Notes (Addendum)
Patient ID: Caroline [name], female   DOB: [birth year], 67 y.o.   MRN: [id number] Subjective:   Patient ID: Caroline [name] female   DOB: 03/15/1945 67 y.o.   MRN: [id number]  CC:   Follow up visit.    HPI:  Caroline [name] is a 67 y.o. man lady with past medical history as outlined below, who presents for a followup visit today   1). Asthma: Patient is currently taking albuterol inhaler, symbicor inhaler, and Singulair. She does not have cough, wheezing, shortness of breath or chest pain.   2.) Hypertension: Patient is currently taking metoprolol and hydrochlorothiazide. Today her blood pressure is 139/80 mmHg. she does not have chest pain, shortness of breath, leg edema, palpitation.  3.) History for pulmonary embolism and DVT: Patient is currently taking Coumadin. Her last INR was 2.9 on 11/19/12. She does not have bleeding tendency. Patient does not have chest pain, shortness of breath, or signs of DVT. There is no tenderness over her calf areas bilaterally.  4.) Hyperlipidemia: Patient used to take pravastatin 40 mg daily. She did not noticed any side effects, such as muscle pain. Her last LDL was 130 at 09/13/11. His pravastatin dose was increased from 40 to 80 mg daily. No recent lipid profile checked. Her LFT was normal on 09/26/12.  ROS: Denies fever, chills, fatigue, headaches,  cough, chest pain, SOB,  abdominal pain,diarrhea, constipation, dysuria, urgency, frequency, hematuria.     Past Medical History  Diagnosis Date  . Hypertension   . Diabetes mellitus   . Vertigo   . DVT, lower extremity, recurrent     Patient had unprovoked PE on 2002 and DVT in right lower extremety 2008.  . PE (pulmonary embolism)     Patient had unprovoked PE on 2002   Current Outpatient Prescriptions  Medication Sig Dispense Refill  . acetaminophen (TYLENOL) 500 MG tablet Take 500 mg by mouth every 6 (six) hours as needed. pain       . albuterol (PROVENTIL HFA;VENTOLIN HFA) 108 (90 BASE) MCG/ACT inhaler  Inhale 2 puffs into the lungs every 6 (six) hours as needed. SOB  1 Inhaler  5  . budesonide-formoterol (SYMBICORT) 80-4.5 MCG/ACT inhaler Inhale 2 puffs into the lungs 2 (two) times daily.  1 Inhaler  11  . calcium citrate-vitamin D (CITRACAL+D) 315-200 MG-UNIT per tablet Take 1 tablet by mouth daily.      . fluticasone (FLONASE) 50 MCG/ACT nasal spray Place 2 sprays into the nose daily.  16 g  3  . guaiFENesin-codeine (ROBITUSSIN AC) 100-10 MG/5ML syrup Take 5 mLs by mouth 3 (three) times daily as needed for cough.  120 mL  0  . hydrochlorothiazide (HYDRODIURIL) 25 MG tablet TAKE 1 TABLET (25 MG TOTAL) BY MOUTH DAILY.  90 tablet  3  . meclizine (ANTIVERT) 25 MG tablet TAKE 1 TABLET BY MOUTH 3 TIMES A DAY AS NEEDED  90 tablet  1  . metoprolol tartrate (LOPRESSOR) 25 MG tablet Take 1 tablet (25 mg total) by mouth 2 (two) times daily.  120 tablet  3  . montelukast (SINGULAIR) 10 MG tablet Take 1 tablet (10 mg total) by mouth at bedtime.  30 tablet  5  . Multiple Vitamins-Minerals (CENTRUM SILVER ULTRA WOMENS) TABS Take 1 tablet by mouth daily.      [name] omeprazole (PRILOSEC) 20 MG capsule Take 20 mg by mouth daily.      . pravastatin (PRAVACHOL) 40 MG tablet TAKE 2 TABLETS BY MOUTH ONCE DAILY.  60 tablet  5  . pravastatin (PRAVACHOL) 40 MG tablet TAKE 2 TABLETS BY MOUTH ONCE DAILY.  60 tablet  5  . predniSONE (DELTASONE) 10 MG tablet Take by mouth 4 tablets/day x2 days, then 3 tabs/day x2 days, then 2 tabs/day x2 days, then 1 tab/day x2 days  20 tablet  0  . promethazine (PHENERGAN) 25 MG tablet Take 1 tablet (25 mg total) by mouth every 6 (six) hours as needed for nausea.  12 tablet  0  . warfarin (COUMADIN) 4 MG tablet Take 2 mg by mouth See admin instructions. Take 2 mg on wednesdays      . warfarin (COUMADIN) 6 MG tablet Take 6 mg by mouth daily. Takes 6 mg mon,tues,thurs,fri,sat sunday       No current facility-administered medications for this visit.   No family history on file. History    Social History  . Marital Status: Divorced    Spouse Name: N/A    Number of Children: N/A  . Years of Education: N/A   Social History Main Topics  . Smoking status: Never Smoker   . Smokeless tobacco: Not on file  . Alcohol Use: No  . Drug Use: No  . Sexual Activity: Yes    Birth Control/ Protection: None   Other Topics Concern  . Not on file   Social History Narrative  . No narrative on file    Review of Systems: Full 14-point review of systems otherwise negative. See HPI.   Objective:  Physical Exam: There were no vitals filed for this visit.   Physical Exam:   Filed Vitals:   11/20/12 1317  BP: 139/80  Pulse: 85  Temp: 98 F (36.7 C)  TempSrc: Oral  Height: 5' 0.25" (1.53 m)  Weight: 285 lb 8 oz (129.502 kg)  SpO2: 96%    General: Not in acute distress HEENT: PERRL, EOMI, no scleral icterus, No JVD or bruit Cardiac: S1/S2, RRR, No murmurs, gallops or rubs Pulm: Good air movement bilaterally. Clear to auscultation bilaterally. No rales, wheezing, rhonchi or rubs. Abd: Soft, nondistended, nontender, no rebound pain, no organomegaly, BS present Ext: No edema. 2+DP/PT pulse bilaterally Musculoskeletal: No joint deformities, erythema, or stiffness, ROM full Skin: No rashes.  Neuro: Alert and oriented X3, cranial nerves II-XII grossly intact, muscle strength 5/5 in all extremeties, sensation to light touch intact.  Psych: Patient is not psychotic, no suicidal or hemocidal ideation.    Assessment & Plan:   Addendum:  Her LDL is at goal (<130). Her K is slightly low at 3.3. Will treat with 40 mEq of KCl X 1.   Lorretta Harp, MD PGY3, Internal Medicine Teaching Service Pager: [phone]

## 2012-11-20 NOTE — Assessment & Plan Note (Signed)
-  will give flu shot today -Check lipid profile -Patient refused colonoscopy in the past, her FOBT was negative on 09/2011.

## 2012-11-20 NOTE — Assessment & Plan Note (Signed)
It is stable. No signs of acute exacerbation. Will continue current regimen, including albuterol inhaler, symbicor inhaler, and Singulair.

## 2012-11-20 NOTE — Assessment & Plan Note (Signed)
Patient has recurrent DVT and PE. She should take Coumadin indefinitely. Her INR was therapeutic at 2.9 on 11/19/12. Will continue current regimen.

## 2012-11-21 ENCOUNTER — Other Ambulatory Visit: Payer: Self-pay | Admitting: Internal Medicine

## 2012-11-21 DIAGNOSIS — E876 Hypokalemia: Secondary | ICD-10-CM

## 2012-11-21 LAB — LIPID PANEL
Cholesterol: 189 mg/dL (ref 0–200)
LDL Cholesterol: 115 mg/dL — ABNORMAL HIGH (ref 0–99)
VLDL: 31 mg/dL (ref 0–40)

## 2012-11-21 LAB — BASIC METABOLIC PANEL WITH GFR
Chloride: 103 mEq/L (ref 96–112)
GFR, Est Non African American: 79 mL/min
Potassium: 3.3 mEq/L — ABNORMAL LOW (ref 3.5–5.3)

## 2012-11-21 NOTE — Addendum Note (Signed)
Addended by: Bufford Spikes on: 11/21/2012 12:14 PM   Modules accepted: Orders

## 2012-11-22 MED ORDER — POTASSIUM CHLORIDE ER 20 MEQ PO TBCR: 40.0000 meq | EXTENDED_RELEASE_TABLET | Freq: Every day | ORAL | Status: DC

## 2012-11-22 NOTE — Addendum Note (Signed)
Addended by: Lorretta Harp on: 11/22/2012 11:53 AM   Modules accepted: Orders

## 2012-11-23 NOTE — Progress Notes (Signed)
Case discussed with Dr. Niu soon after the resident saw the patient.  We reviewed the resident's history and exam and pertinent patient test results.  I agree with the assessment, diagnosis, and plan of care documented in the resident's note. 

## 2012-11-24 ENCOUNTER — Other Ambulatory Visit: Payer: Self-pay | Admitting: Internal Medicine

## 2012-11-24 DIAGNOSIS — I82409 Acute embolism and thrombosis of unspecified deep veins of unspecified lower extremity: Secondary | ICD-10-CM

## 2012-12-03 ENCOUNTER — Ambulatory Visit (INDEPENDENT_AMBULATORY_CARE_PROVIDER_SITE_OTHER): Payer: Medicare Other | Admitting: Pharmacist

## 2012-12-03 DIAGNOSIS — Z7901 Long term (current) use of anticoagulants: Secondary | ICD-10-CM

## 2012-12-03 DIAGNOSIS — Z86718 Personal history of other venous thrombosis and embolism: Secondary | ICD-10-CM

## 2012-12-03 LAB — POCT INR: INR: 3

## 2012-12-03 NOTE — Progress Notes (Signed)
Anti-Coagulation Progress Note  Caroline [name] is a 67 y.o. female who is currently on an anti-coagulation regimen.    RECENT RESULTS: Recent results are below, the most recent result is correlated with a dose of 26 mg. per week: Lab Results  Component Value Date   INR 3.00 12/03/2012   INR 2.90 11/19/2012   INR 1.90 10/22/2012    ANTI-COAG DOSE: Anticoagulation Dose Instructions as of 12/03/2012     [name] Tue Wed Thu Fri Sat   New Dose 4 mg 2 mg 4 mg 4 mg 2 mg 4 mg 4 mg       ANTICOAG SUMMARY: Anticoagulation Episode Summary   Current INR goal 2.0-3.0  Next INR check 01/07/2013  INR from last check 3.00 (12/03/2012)  Weekly max dose   Target end date Indefinite  INR check location [facility]  Preferred lab   Send INR reminders to    Indications  PULMONARY EMBOLISM HX OF [V12.51] Encounter for long-term (current) use of anticoagulants [V58.61]        Comments 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 The first she describes as "a blood clot in my leg"; the second she describes as "a blood clot in my lung". Only the PE is documented on her problem list as "PE, History of". States she has been on warfarin for "10 years".         ANTICOAG TODAY: Anticoagulation Summary as of 12/03/2012   INR goal 2.0-3.0  Selected INR 3.00 (12/03/2012)  Next INR check 01/07/2013  Target end date Indefinite   Indications  PULMONARY EMBOLISM HX OF [V12.51] Encounter for long-term (current) use of anticoagulants [V58.61]      Anticoagulation Episode Summary   INR check location [facility]   Preferred lab    Send INR reminders to    Comments 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 The first she describes as "a blood clot in my leg"; the second she describes as "a blood clot in my lung". Only the PE is documented on her problem list as "PE, History of". States she has been on warfarin for "10 years".       PATIENT INSTRUCTIONS: Patient  Instructions  Patient instructed to take medications as defined in the Anti-coagulation Track section of this encounter.  Patient instructed to take today's dose.  Patient verbalized understanding of these instructions.       FOLLOW-UP Return in 5 weeks (on 01/07/2013) for Follow up INR at 1045h.  Hulen Luster, III Pharm.D., CACP

## 2012-12-03 NOTE — Patient Instructions (Signed)
Patient instructed to take medications as defined in the Anti-coagulation Track section of this encounter.  Patient instructed to take today's dose.  Patient verbalized understanding of these instructions.    

## 2013-01-04 ENCOUNTER — Encounter (HOSPITAL_COMMUNITY): Payer: Self-pay | Admitting: Emergency Medicine

## 2013-01-04 DIAGNOSIS — Z880 Allergy status to penicillin: Secondary | ICD-10-CM | POA: Insufficient documentation

## 2013-01-04 DIAGNOSIS — I10 Essential (primary) hypertension: Secondary | ICD-10-CM | POA: Insufficient documentation

## 2013-01-04 DIAGNOSIS — R791 Abnormal coagulation profile: Secondary | ICD-10-CM | POA: Insufficient documentation

## 2013-01-04 DIAGNOSIS — Z7901 Long term (current) use of anticoagulants: Secondary | ICD-10-CM | POA: Insufficient documentation

## 2013-01-04 DIAGNOSIS — Z86718 Personal history of other venous thrombosis and embolism: Secondary | ICD-10-CM | POA: Insufficient documentation

## 2013-01-04 DIAGNOSIS — K644 Residual hemorrhoidal skin tags: Secondary | ICD-10-CM | POA: Insufficient documentation

## 2013-01-04 DIAGNOSIS — IMO0002 Reserved for concepts with insufficient information to code with codable children: Secondary | ICD-10-CM | POA: Insufficient documentation

## 2013-01-04 DIAGNOSIS — Z79899 Other long term (current) drug therapy: Secondary | ICD-10-CM | POA: Insufficient documentation

## 2013-01-04 DIAGNOSIS — E119 Type 2 diabetes mellitus without complications: Secondary | ICD-10-CM | POA: Insufficient documentation

## 2013-01-04 DIAGNOSIS — Z86711 Personal history of pulmonary embolism: Secondary | ICD-10-CM | POA: Insufficient documentation

## 2013-01-04 LAB — CBC
Hemoglobin: 12.1 g/dL (ref 12.0–15.0)
RBC: 4.67 MIL/uL (ref 3.87–5.11)

## 2013-01-04 LAB — PROTIME-INR
INR: 2.22 — ABNORMAL HIGH (ref 0.00–1.49)
Prothrombin Time: 23.9 seconds — ABNORMAL HIGH (ref 11.6–15.2)

## 2013-01-04 NOTE — ED Notes (Signed)
Pt here with c/o loose stool and she see some some bright red blood in her stool , pt is on coumadin and has a history of hemorrhoids

## 2013-01-05 ENCOUNTER — Emergency Department (HOSPITAL_COMMUNITY)
Admission: EM | Admit: 2013-01-05 | Discharge: 2013-01-05 | Disposition: A | Discharge status: Home / Self Care | Payer: No Typology Code available for payment source | Attending: Emergency Medicine | Admitting: Emergency Medicine

## 2013-01-05 ENCOUNTER — Emergency Department (HOSPITAL_COMMUNITY)
Admission: EM | Admit: 2013-01-05 | Discharge: 2013-01-05 | Disposition: A | Discharge status: Home / Self Care | Payer: Medicare Other | Attending: Emergency Medicine | Admitting: Emergency Medicine

## 2013-01-05 ENCOUNTER — Encounter (HOSPITAL_COMMUNITY): Payer: Self-pay | Admitting: Emergency Medicine

## 2013-01-05 DIAGNOSIS — H538 Other visual disturbances: Secondary | ICD-10-CM | POA: Insufficient documentation

## 2013-01-05 DIAGNOSIS — Z86718 Personal history of other venous thrombosis and embolism: Secondary | ICD-10-CM | POA: Insufficient documentation

## 2013-01-05 DIAGNOSIS — Z79899 Other long term (current) drug therapy: Secondary | ICD-10-CM | POA: Insufficient documentation

## 2013-01-05 DIAGNOSIS — I10 Essential (primary) hypertension: Secondary | ICD-10-CM | POA: Insufficient documentation

## 2013-01-05 DIAGNOSIS — Z86711 Personal history of pulmonary embolism: Secondary | ICD-10-CM | POA: Insufficient documentation

## 2013-01-05 DIAGNOSIS — E119 Type 2 diabetes mellitus without complications: Secondary | ICD-10-CM | POA: Diagnosis not present

## 2013-01-05 DIAGNOSIS — Z7901 Long term (current) use of anticoagulants: Secondary | ICD-10-CM | POA: Insufficient documentation

## 2013-01-05 DIAGNOSIS — Z880 Allergy status to penicillin: Secondary | ICD-10-CM | POA: Diagnosis not present

## 2013-01-05 DIAGNOSIS — R51 Headache: Secondary | ICD-10-CM | POA: Insufficient documentation

## 2013-01-05 DIAGNOSIS — Y9389 Activity, other specified: Secondary | ICD-10-CM | POA: Diagnosis not present

## 2013-01-05 DIAGNOSIS — E669 Obesity, unspecified: Secondary | ICD-10-CM | POA: Insufficient documentation

## 2013-01-05 DIAGNOSIS — T148XXA Other injury of unspecified body region, initial encounter: Secondary | ICD-10-CM

## 2013-01-05 DIAGNOSIS — Y92410 Unspecified street and highway as the place of occurrence of the external cause: Secondary | ICD-10-CM | POA: Diagnosis not present

## 2013-01-05 DIAGNOSIS — IMO0002 Reserved for concepts with insufficient information to code with codable children: Secondary | ICD-10-CM | POA: Diagnosis not present

## 2013-01-05 DIAGNOSIS — R209 Unspecified disturbances of skin sensation: Secondary | ICD-10-CM | POA: Insufficient documentation

## 2013-01-05 DIAGNOSIS — Z5181 Encounter for therapeutic drug level monitoring: Secondary | ICD-10-CM

## 2013-01-05 DIAGNOSIS — S46909A Unspecified injury of unspecified muscle, fascia and tendon at shoulder and upper arm level, unspecified arm, initial encounter: Secondary | ICD-10-CM | POA: Diagnosis present

## 2013-01-05 DIAGNOSIS — K644 Residual hemorrhoidal skin tags: Secondary | ICD-10-CM

## 2013-01-05 MED ORDER — HYDROCORTISONE 2.5 % RE CREA: TOPICAL_CREAM | RECTAL | Status: DC

## 2013-01-05 MED ORDER — METHOCARBAMOL 500 MG PO TABS: 1000.0000 mg | ORAL_TABLET | Freq: Four times a day (QID) | ORAL | Status: DC
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
## 2013-01-05 NOTE — ED Provider Notes (Signed)
CSN: [id number]     Arrival date & time 01/04/13  1748 History   First MD Initiated Contact with Patient 01/05/13 0135     Chief Complaint  Patient presents with  . Rectal Bleeding   (Consider location/radiation/quality/duration/timing/severity/associated sxs/prior Treatment) HPI Patient is a very pleasant woman in her 73s who is anticoagulated with Coumadin for history of VTE. She presents after noticing a very small amount of BRB on tissue paper after she had a large and hard BM this evening. She came to the ED because she felt she should get checked out because she is anticoagulated.   She denies abdominal pain. No further rectal bleeding.   Past Medical History  Diagnosis Date  . Hypertension   . Diabetes mellitus   . Vertigo   . DVT, lower extremity, recurrent     Patient had unprovoked PE on 2002 and DVT in right lower extremety 2008.  . PE (pulmonary embolism)     Patient had unprovoked PE on 2002   History reviewed. No pertinent past surgical history. History reviewed. No pertinent family history. History  Substance Use Topics  . Smoking status: Never Smoker   . Smokeless tobacco: Not on file  . Alcohol Use: No   OB History   Grav Para Term Preterm Abortions TAB SAB Ect Mult Living                 Review of Systems 10 point ROS obtained and is negative with the exception of symptoms noted above.   Allergies  Penicillins; Shellfish allergy; and Tomato  Home Medications   Current Outpatient Rx  Name  Route  Sig  Dispense  Refill  . acetaminophen (TYLENOL) 500 MG tablet   Oral   Take 500 mg by mouth every 6 (six) hours as needed. pain          . albuterol (PROVENTIL HFA;VENTOLIN HFA) 108 (90 BASE) MCG/ACT inhaler   Inhalation   Inhale 2 puffs into the lungs every 6 (six) hours as needed. SOB   1 Inhaler   5   . budesonide-formoterol (SYMBICORT) 80-4.5 MCG/ACT inhaler   Inhalation   Inhale 2 puffs into the lungs 2 (two) times daily.   1 Inhaler    11   . calcium citrate-vitamin D (CITRACAL+D) 315-200 MG-UNIT per tablet   Oral   Take 1 tablet by mouth daily.         . fluticasone (FLONASE) 50 MCG/ACT nasal spray   Nasal   Place 2 sprays into the nose daily.   16 g   3   . hydrochlorothiazide (HYDRODIURIL) 25 MG tablet      TAKE 1 TABLET (25 MG TOTAL) BY MOUTH DAILY.   90 tablet   3   . meclizine (ANTIVERT) 25 MG tablet      TAKE 1 TABLET BY MOUTH 3 TIMES A DAY AS NEEDED   90 tablet   1   . metoprolol tartrate (LOPRESSOR) 25 MG tablet   Oral   Take 1 tablet (25 mg total) by mouth 2 (two) times daily.   120 tablet   3   . montelukast (SINGULAIR) 10 MG tablet   Oral   Take 1 tablet (10 mg total) by mouth at bedtime.   30 tablet   5   . Multiple Vitamins-Minerals (CENTRUM SILVER ULTRA WOMENS) TABS   Oral   Take 1 tablet by mouth daily.         [name] omeprazole (PRILOSEC) 20 MG capsule  Oral   Take 20 mg by mouth daily.         . pravastatin (PRAVACHOL) 40 MG tablet      TAKE 2 TABLETS BY MOUTH ONCE DAILY.   60 tablet   5   . promethazine (PHENERGAN) 25 MG tablet   Oral   Take 1 tablet (25 mg total) by mouth every 6 (six) hours as needed for nausea.   12 tablet   0   . warfarin (COUMADIN) 4 MG tablet   Oral   Take 2-4 mg by mouth See admin instructions. Take 2 mg on Monday and Thursday. All the rest of the days patient takes 4 mg          BP 150/87  Pulse 93  Temp(Src) 98.8 F (37.1 C) (Oral)  Resp 19  SpO2 97% Physical Exam Gen: well developed and well nourished appearing Head: NCAT Eyes: PERL, EOMI Nose: no epistaixis or rhinorrhea Mouth/throat: mucosa is moist and pink Neck: supple, no stridor Lungs: CTA B, no wheezing, rhonchi or rales CV: RRR, no murmur, extremities well perfused,  Abd: soft, notender, nondistended Rectal exam: some external hemorrhoids appreciated, stool is brown and heme negative stool.  Back: no ttp, no cva ttp Skin: warm and dry Extremities: normal to  inspection, no edema. Neuro: CN ii-xii grossly intact, no focal deficits Psyche; normal affect,  calm and cooperative.    ED Course  Procedures (including critical care time)  Results for orders placed during the hospital encounter of 01/05/13 (from the past 24 hour(s))  CBC     Status: Abnormal   Collection Time    01/04/13  6:42 PM      Result Value Range   WBC 8.2  4.0 - 10.5 K/uL   RBC 4.67  3.87 - 5.11 MIL/uL   Hemoglobin 12.1  12.0 - 15.0 g/dL   HCT 16.1  09.6 - 04.5 %   MCV 79.9  78.0 - 100.0 fL   MCH 25.9 (*) 26.0 - 34.0 pg   MCHC 32.4  30.0 - 36.0 g/dL   RDW 40.9  81.1 - 91.4 %   Platelets 190  150 - 400 K/uL  PROTIME-INR     Status: Abnormal   Collection Time    01/04/13  6:42 PM      Result Value Range   Prothrombin Time 23.9 (*) 11.6 - 15.2 seconds   INR 2.22 (*) 0.00 - 1.49     MDM   Suspected scant hemorrhoidal bleeding following difficulty passing well formed BM. Patient has normal VS, normal H/H, asymptomatic with brown and heme negative stool. Reassured and given return precautions. Stable for d/c.    Brandt Loosen, MD 01/05/13 [phone]

## 2013-01-06 NOTE — ED Provider Notes (Signed)
Medical screening examination/treatment/procedure(s) were performed by non-physician practitioner and as supervising physician I was immediately available for consultation/collaboration.  EKG Interpretation   None         Junius Argyle, MD 01/06/13 0040

## 2013-01-07 ENCOUNTER — Ambulatory Visit (INDEPENDENT_AMBULATORY_CARE_PROVIDER_SITE_OTHER): Payer: Medicare Other | Admitting: Pharmacist

## 2013-01-07 DIAGNOSIS — Z5181 Encounter for therapeutic drug level monitoring: Secondary | ICD-10-CM

## 2013-01-07 DIAGNOSIS — Z7901 Long term (current) use of anticoagulants: Secondary | ICD-10-CM

## 2013-01-07 DIAGNOSIS — Z86718 Personal history of other venous thrombosis and embolism: Secondary | ICD-10-CM

## 2013-01-07 NOTE — Patient Instructions (Signed)
Patient instructed to take medications as defined in the Anti-coagulation Track section of this encounter.  Patient instructed to take today's dose.  Patient verbalized understanding of these instructions.    

## 2013-01-07 NOTE — Progress Notes (Signed)
Anti-Coagulation Progress Note  Caroline [name] is a 67 y.o. female who is currently on an anti-coagulation regimen.    RECENT RESULTS: Recent results are below, the most recent result is correlated with a dose of 24 mg. per week: Lab Results  Component Value Date   INR 3.40 01/07/2013   INR 2.22* 01/04/2013   INR 3.00 12/03/2012    ANTI-COAG DOSE: Anticoagulation Dose Instructions as of 01/07/2013     [name] Tue Wed Thu Fri Sat   New Dose 4 mg 2 mg 4 mg 2 mg 4 mg 2 mg 4 mg       ANTICOAG SUMMARY: Anticoagulation Episode Summary   Current INR goal 2.0-3.0  Next INR check 02/04/2013  INR from last check 3.40! (01/07/2013)  Weekly max dose   Target end date Indefinite  [facility] check location [facility]  Preferred lab   Send INR reminders to    Indications  PULMONARY EMBOLISM HX OF [V12.51] Encounter for long-term (current) use of anticoagulants [V58.61]        Comments 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 The first she describes as "a blood clot in my leg"; the second she describes as "a blood clot in my lung". Only the PE is documented on her problem list as "PE, History of". States she has been on warfarin for "10 years".         ANTICOAG TODAY: Anticoagulation Summary as of 01/07/2013   INR goal 2.0-3.0  Selected INR 3.40! (01/07/2013)  Next INR check 02/04/2013  Target end date Indefinite   Indications  PULMONARY EMBOLISM HX OF [V12.51] Encounter for long-term (current) use of anticoagulants [V58.61]      Anticoagulation Episode Summary   [facility] check [facility]   Preferred lab    Send INR reminders to    Comments 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 The first she describes as "a blood clot in my leg"; the second she describes as "a blood clot in my lung". Only the PE is documented on her problem list as "PE, History of". States she has been on warfarin for "10 years".       PATIENT INSTRUCTIONS: Patient  Instructions  Patient instructed to take medications as defined in the Anti-coagulation Track section of this encounter.  Patient instructed to take today's dose.  Patient verbalized understanding of these instructions.       FOLLOW-UP Return in 4 weeks (on 02/04/2013) for Follow up INR at 1045h.  Hulen Luster, III Pharm.D., CACP

## 2013-02-04 ENCOUNTER — Ambulatory Visit (INDEPENDENT_AMBULATORY_CARE_PROVIDER_SITE_OTHER): Payer: Medicare Other | Admitting: Pharmacist

## 2013-02-04 DIAGNOSIS — Z86718 Personal history of other venous thrombosis and embolism: Secondary | ICD-10-CM

## 2013-02-04 DIAGNOSIS — Z7901 Long term (current) use of anticoagulants: Secondary | ICD-10-CM

## 2013-02-04 LAB — POCT INR: INR: 2.5

## 2013-02-05 ENCOUNTER — Telehealth: Payer: Self-pay | Admitting: *Deleted

## 2013-02-05 NOTE — Telephone Encounter (Signed)
Received notice from pt's insurance that she will require a PA for her pravastatin 40mg  tabs.  There is a qty limit on this medication and pt is currently taking two pills a day (80 mg daily).  Will forward info to PCP to have him review dose and send in rx for pravastatin 80mg  daily.  I spoke with pt and she confirms taking two 40mg  pills daily. Pt uses [facility] on [address].Kingsley Spittle Cassady1/27/20151:32 PM    (Member ID [id number])

## 2013-02-05 NOTE — Patient Instructions (Signed)
Patient instructed to take medications as defined in the Anti-coagulation Track section of this encounter.  Patient instructed to take today's dose.  Patient verbalized understanding of these instructions.    

## 2013-02-05 NOTE — Progress Notes (Signed)
Anti-Coagulation Progress Note  Caroline [name] is a 68 y.o. female who is currently on an anti-coagulation regimen.    RECENT RESULTS: Recent results are below, the most recent result is correlated with a dose of 22 mg. per week: Lab Results  Component Value Date   INR 2.50 02/04/2013   INR 3.40 01/07/2013   INR 2.22* 01/04/2013    ANTI-COAG DOSE: Anticoagulation Dose Instructions as of 02/04/2013     [name] Tue Wed Thu Fri Sat   New Dose 4 mg 2 mg 4 mg 2 mg 4 mg 2 mg 4 mg       ANTICOAG SUMMARY: Anticoagulation Episode Summary   Current INR goal 2.0-3.0  Next INR check 03/04/2013  INR from last check 2.50 (02/04/2013)  Weekly max dose   Target end date Indefinite  [facility]  Preferred lab   Send INR reminders to    Indications  PULMONARY EMBOLISM HX OF [V12.51] Encounter for long-term (current) use of anticoagulants [V58.61]        Comments 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 The first she describes as "a blood clot in my leg"; the second she describes as "a blood clot in my lung". Only the PE is documented on her problem list as "PE, History of". States she has been on warfarin for "10 years".         ANTICOAG TODAY: Anticoagulation Summary as of 02/04/2013   INR goal 2.0-3.0  Selected INR 2.50 (02/04/2013)  Next INR check 03/04/2013  Target end date Indefinite   Indications  PULMONARY EMBOLISM HX OF [V12.51] Encounter for long-term (current) use of anticoagulants [V58.61]      Anticoagulation Episode Summary   [facility]   Preferred lab    Send INR reminders to    Comments 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 The first she describes as "a blood clot in my leg"; the second she describes as "a blood clot in my lung". Only the PE is documented on her problem list as "PE, History of". States she has been on warfarin for "10 years".       PATIENT INSTRUCTIONS: Patient  Instructions  Patient instructed to take medications as defined in the Anti-coagulation Track section of this encounter.  Patient instructed to take today's dose.  Patient verbalized understanding of these instructions.       FOLLOW-UP Return in 4 weeks (on 03/04/2013) for Follow up INR at 1030h.  Hulen Luster, III Pharm.D., CACP

## 2013-02-06 ENCOUNTER — Other Ambulatory Visit: Payer: Self-pay | Admitting: Internal Medicine

## 2013-02-06 DIAGNOSIS — E785 Hyperlipidemia, unspecified: Secondary | ICD-10-CM

## 2013-02-06 MED ORDER — PRAVASTATIN SODIUM 80 MG PO TABS: 80.0000 mg | ORAL_TABLET | Freq: Every day | ORAL | Status: DC

## 2013-02-06 NOTE — Telephone Encounter (Signed)
New prescription pravastatin 80 mg daily (30 pills x 5 refills) was sent patient's pharmacy.   Lorretta Harp, MD PGY3, Internal Medicine Teaching Service Pager: [phone]

## 2013-02-06 NOTE — Progress Notes (Signed)
Indication: Recurrent venous thromboembolism. Duration: Lifelong. INR: At target. Agree with Dr. Groce's assessment and plan. 

## 2013-02-19 ENCOUNTER — Ambulatory Visit (INDEPENDENT_AMBULATORY_CARE_PROVIDER_SITE_OTHER): Payer: Medicare Other | Admitting: Internal Medicine

## 2013-02-19 ENCOUNTER — Encounter: Payer: Self-pay | Admitting: Internal Medicine

## 2013-02-19 VITALS — BP 135/77 | HR 81 | Temp 97.3°F | Ht 60.25 in | Wt 287.8 lb

## 2013-02-19 DIAGNOSIS — R42 Dizziness and giddiness: Secondary | ICD-10-CM

## 2013-02-19 DIAGNOSIS — Z0000 Encounter for general adult medical examination without abnormal findings: Secondary | ICD-10-CM

## 2013-02-19 DIAGNOSIS — Z86718 Personal history of other venous thrombosis and embolism: Secondary | ICD-10-CM

## 2013-02-19 DIAGNOSIS — I82409 Acute embolism and thrombosis of unspecified deep veins of unspecified lower extremity: Secondary | ICD-10-CM

## 2013-02-19 DIAGNOSIS — I10 Essential (primary) hypertension: Secondary | ICD-10-CM

## 2013-02-19 DIAGNOSIS — E785 Hyperlipidemia, unspecified: Secondary | ICD-10-CM

## 2013-02-19 DIAGNOSIS — J45909 Unspecified asthma, uncomplicated: Secondary | ICD-10-CM

## 2013-02-19 LAB — BASIC METABOLIC PANEL WITH GFR
BUN: 12 mg/dL (ref 6–23)
CHLORIDE: 101 meq/L (ref 96–112)
CO2: 32 meq/L (ref 19–32)
CREATININE: 0.68 mg/dL (ref 0.50–1.10)
Calcium: 9.7 mg/dL (ref 8.4–10.5)
GFR, Est African American: >89 mL/min
Glucose, Bld: 123 mg/dL — ABNORMAL HIGH (ref 70–99)
POTASSIUM: 3.7 meq/L (ref 3.5–5.3)
Sodium: 143 mEq/L (ref 135–145)

## 2013-02-19 MED ORDER — MECLIZINE HCL 25 MG PO TABS: 25.0000 mg | ORAL_TABLET | Freq: Three times a day (TID) | ORAL | Status: DC | PRN

## 2013-02-19 NOTE — Assessment & Plan Note (Addendum)
BP Readings from Last 3 Encounters:  02/19/13 135/77  01/05/13 135/72  01/05/13 137/84    Lab Results  Component Value Date   NA 141 11/20/2012   K 3.3* 11/20/2012   CREATININE 0.79 11/20/2012    Assessment: Blood pressure control: controlled Progress toward BP goal:  at goal Comments:   Plan: Medications:  continue current medications Educational resources provided: brochure Self management tools provided:   Other plans: bp is well controlled, will continue current regimen, hydrochlorothiazide 25 mg daily, and metoprolol 25 mg twice a day. Will check BMP for K level today.

## 2013-02-19 NOTE — Assessment & Plan Note (Signed)
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 Her last INR was 2.5 on 12/05/13. She does not have bleeding tendency. Patient does not have chest pain, shortness of breath, or signs of DVT. There is no tenderness over her calf areas bilaterally. Will continue current regimen.

## 2013-02-19 NOTE — Assessment & Plan Note (Signed)
-  Patient refused colonoscopy in the past, Hemoccult test was negative X 3 in 09/2011 -She reports that she received Tetanus/tDap shot in 2014.  -Mammogram, pneumococcal vaccination and flu vaccination are up-to-date

## 2013-02-19 NOTE — Patient Instructions (Signed)
1. You have done great job in taking all your medications. I appreciate it very much. Please continue doing that. 2. Please take all medications as prescribed.  3. If you have worsening of your symptoms or new symptoms arise, please call the clinic ([phone]), or go to the ER immediately if symptoms are severe.  Please bring in all your medication bottles with you in next visit.    

## 2013-02-19 NOTE — Progress Notes (Signed)
Patient ID: Caroline [name], female   DOB: 08/28/1945, 68 y.o.   MRN: [id number] Subjective:   Patient ID: Caroline [name] female   DOB: 23-Mar-1945 68 y.o.   MRN: [id number]  CC:   Follow up visit.    HPI:  [name] is a 68 y.o. man lady with past medical history as outlined below, who presents for a followup visit today   1). Asthma: Patient is currently taking albuterol inhaler, symbicor inhaler, and Singulair. She occasionally has mild cough, but no wheezing, shortness of breath or chest pain.   2.) Hypertension: Patient is currently taking metoprolol and hydrochlorothiazide. Today her blood pressure is 135/77 mmHg. she does not have chest pain, shortness of breath, leg edema, palpitation.  3.) History for pulmonary embolism and DVT: Patient is currently taking Coumadin. Her last INR was 2.5 on 12/05/13. She does not have bleeding tendency. Patient does not have chest pain, shortness of breath, or signs of DVT. There is no tenderness over her calf areas bilaterally.  4.) Hyperlipidemia: Her last LDL was 115 on 11/20/12. She is taking pravastatin 80 mg daily. He did not noticed any side effects, such as muscle pain. . No recent lipid profile checked. Her LFT was normal on 09/26/12.  ROS: Denies fever, chills, fatigue, headaches, chest pain, SOB,  abdominal pain,diarrhea, constipation, dysuria, urgency, frequency, hematuria.     Past Medical History  Diagnosis Date  . Hypertension   . Diabetes mellitus   . Vertigo   . DVT, lower extremity, recurrent     Patient had unprovoked PE on 2002 and DVT in right lower extremety 2008.  . PE (pulmonary embolism)     Patient had unprovoked PE on 2002   Current Outpatient Prescriptions  Medication Sig Dispense Refill  . acetaminophen (TYLENOL) 500 MG tablet Take 500 mg by mouth every 6 (six) hours as needed for mild pain or moderate pain. pain      . albuterol (PROVENTIL HFA;VENTOLIN HFA) 108 (90 BASE) MCG/ACT inhaler Inhale 2 puffs into the  lungs every 6 (six) hours as needed. SOB  1 Inhaler  5  . budesonide-formoterol (SYMBICORT) 80-4.5 MCG/ACT inhaler Inhale 2 puffs into the lungs 2 (two) times daily.  1 Inhaler  11  . calcium citrate-vitamin D (CITRACAL+D) 315-200 MG-UNIT per tablet Take 1 tablet by mouth daily.      . fluticasone (FLONASE) 50 MCG/ACT nasal spray Place 1 spray into both nostrils daily as needed for allergies or rhinitis.      . hydrochlorothiazide (HYDRODIURIL) 25 MG tablet Take 25 mg by mouth daily.      . hydrocortisone (ANUSOL-HC) 2.5 % rectal cream Place 1 application rectally 2 (two) times daily.      . meclizine (ANTIVERT) 25 MG tablet Take 1 tablet (25 mg total) by mouth 3 (three) times daily as needed for dizziness.  30 tablet  1  . metoprolol tartrate (LOPRESSOR) 25 MG tablet Take 1 tablet (25 mg total) by mouth 2 (two) times daily.  120 tablet  3  . montelukast (SINGULAIR) 10 MG tablet Take 1 tablet (10 mg total) by mouth at bedtime.  30 tablet  5  . Multiple Vitamins-Minerals (CENTRUM SILVER ULTRA WOMENS) TABS Take 1 tablet by mouth daily.      [name] omeprazole (PRILOSEC) 20 MG capsule Take 20 mg by mouth daily as needed (acid reflex).       . pravastatin (PRAVACHOL) 80 MG tablet Take 1 tablet (80 mg total) by mouth daily.  30 tablet  5  . warfarin (COUMADIN) 4 MG tablet Take 2-4 mg by mouth See admin instructions. Take 2 mg on Monday and Thursday. All the rest of the days patient takes 4 mg       No current facility-administered medications for this visit.   No family history on file. History   Social History  . Marital Status: Divorced    Spouse Name: N/A    Number of Children: N/A  . Years of Education: N/A   Social History Main Topics  . Smoking status: Never Smoker   . Smokeless tobacco: None  . Alcohol Use: No  . Drug Use: No  . Sexual Activity: Yes    Birth Control/ Protection: None   Other Topics Concern  . None   Social History Narrative  . None    Review of Systems: Full  14-point review of systems otherwise negative. See HPI.   Objective:  Physical Exam: Filed Vitals:   02/19/13 1440  BP: 135/77  Pulse: 81  Temp: 97.3 F (36.3 C)  TempSrc: Oral  Height: 5' 0.25" (1.53 m)  Weight: 287 lb 12.8 oz (130.545 kg)  SpO2: 98%    General: Not in acute distress HEENT: PERRL, EOMI, no scleral icterus, No JVD or bruit Cardiac: S1/S2, RRR, No murmurs, gallops or rubs Pulm: Good air movement bilaterally. Clear to auscultation bilaterally. No rales, wheezing, rhonchi or rubs. Abd: Soft, nondistended, nontender, no rebound pain, no organomegaly, BS present Ext: No edema. 2+DP/PT pulse bilaterally Musculoskeletal: No joint deformities, erythema, or stiffness, ROM full Skin: No rashes.   Neuro: Alert and oriented X3, cranial nerves II-XII grossly intact, muscle strength 5/5 in all extremeties, sensation to light touch intact.   Psych: Patient is not psychotic, no suicidal or hemocidal ideation.   Assessment & Plan:

## 2013-02-19 NOTE — Assessment & Plan Note (Signed)
Patient is currently taking albuterol inhaler, symbicor inhaler, and Singulair. She occasionally has mild cough, but no wheezing, shortness of breath or chest pain. Her lung is clear to auscultation bilaterally. There is no signs of acute exacerbation. Willl continue current regimen.

## 2013-02-20 NOTE — Progress Notes (Signed)
Case discussed with Dr. Niu at the time of the visit.  We reviewed the resident's history and exam and pertinent patient test results.  I agree with the assessment, diagnosis, and plan of care documented in the resident's note.    

## 2013-03-04 ENCOUNTER — Ambulatory Visit (INDEPENDENT_AMBULATORY_CARE_PROVIDER_SITE_OTHER): Payer: Medicare Other | Admitting: Pharmacist

## 2013-03-04 ENCOUNTER — Encounter: Payer: Self-pay | Admitting: Internal Medicine

## 2013-03-04 ENCOUNTER — Ambulatory Visit (INDEPENDENT_AMBULATORY_CARE_PROVIDER_SITE_OTHER): Payer: Medicare Other | Admitting: Internal Medicine

## 2013-03-04 VITALS — BP 130/72 | HR 76 | Temp 97.6°F | Ht 62.0 in | Wt 289.4 lb

## 2013-03-04 DIAGNOSIS — J45909 Unspecified asthma, uncomplicated: Secondary | ICD-10-CM

## 2013-03-04 DIAGNOSIS — Z86718 Personal history of other venous thrombosis and embolism: Secondary | ICD-10-CM

## 2013-03-04 DIAGNOSIS — Z7901 Long term (current) use of anticoagulants: Secondary | ICD-10-CM

## 2013-03-04 DIAGNOSIS — J45901 Unspecified asthma with (acute) exacerbation: Secondary | ICD-10-CM

## 2013-03-04 LAB — POCT INR: INR: 2.2

## 2013-03-04 MED ORDER — IPRATROPIUM BROMIDE HFA 17 MCG/ACT IN AERS: 2.0000 | INHALATION_SPRAY | Freq: Four times a day (QID) | RESPIRATORY_TRACT | Status: DC

## 2013-03-04 MED ORDER — PREDNISONE 50 MG PO TABS: 50.0000 mg | ORAL_TABLET | Freq: Every day | ORAL | Status: DC

## 2013-03-04 MED ORDER — BENZONATATE 100 MG PO CAPS: 100.0000 mg | ORAL_CAPSULE | Freq: Four times a day (QID) | ORAL | Status: DC | PRN

## 2013-03-04 NOTE — Patient Instructions (Signed)
Patient instructed to take medications as defined in the Anti-coagulation Track section of this encounter.  Patient instructed to take today's dose.  Patient verbalized understanding of these instructions.    

## 2013-03-04 NOTE — Patient Instructions (Signed)
1. Please take prednisone 50 mg daily for 5 days. Please use Atrovent inhaler for 7-14 days for your wheezing. Please take Tessalon for cough. 2. Please take all medications as prescribed.  3. If you have worsening of your symptoms or new symptoms arise, please call the clinic ([phone]), or go to the ER immediately if symptoms are severe.  You have done great job in taking all your medications. I appreciate it very much. Please continue doing that.  Please bring in all your medication bottles with you in next visit.

## 2013-03-04 NOTE — Progress Notes (Signed)
Patient ID: Caroline [name], female   DOB: 01/15/1945, 68 y.o.   MRN: [id number] Subjective:   Patient ID: Caroline [name] female   DOB: 06/01/1945 68 y.o.   MRN: [id number]  CC:   Acute visit due to cough and wheezing. HPI:  Caroline [name] is a 68 y.o. lady with past medical history as outlined below, who present for an acute visit today.  1). Asthma: Patient is currently taking albuterol inhaler, symbicor inhaler, and Singulair at home. Patient started having cold-like symptoms one week ago, including cough, runny nose, sore throat and fever. Most of her symptoms improved except for cough. She is coughing up yellow-colored sputum. She also has wheezing. Initially she had mild shortness of breath which has almost resolved. She does not have chest pain. Before patient symptoms started, she had a sick contact with her 18-yo granddaughter with cold-like symptoms, including runny nose, sore throat, fever, chills or body aching. Currently patient is hemodynamically stable, with blood pressure 130/72 and oxygen saturation at 96% on room air.  2.) Hypertension: Patient is currently taking metoprolol and hydrochlorothiazide. Today her blood pressure is 130/72. mmHg. she does not have chest pain, shortness of breath, leg edema, palpitation.  3.) History for pulmonary embolism and DVT: Patient is currently taking Coumadin. Her INR is 2.2 today. She does not have bleeding tendency. Patient does not have chest pain, shortness of breath, or signs of DVT. There is no tenderness over her calf areas bilaterally.  ROS: Patient has some wheezing, cough and mild shortness of breath. Denies fever, chills, fatigue, headaches, chest pain, abdominal pain, diarrhea, constipation, dysuria, urgency, frequency, hematuria  Past Medical History  Diagnosis Date  . Hypertension   . Diabetes mellitus   . Vertigo   . DVT, lower extremity, recurrent     Patient had unprovoked PE on 2002 and DVT in right lower extremety 2008.  . PE  (pulmonary embolism)     Patient had unprovoked PE on 2002   Current Outpatient Prescriptions  Medication Sig Dispense Refill  . acetaminophen (TYLENOL) 500 MG tablet Take 500 mg by mouth every 6 (six) hours as needed for mild pain or moderate pain. pain      . albuterol (PROVENTIL HFA;VENTOLIN HFA) 108 (90 BASE) MCG/ACT inhaler Inhale 2 puffs into the lungs every 6 (six) hours as needed. SOB  1 Inhaler  5  . benzonatate (TESSALON PERLES) 100 MG capsule Take 1 capsule (100 mg total) by mouth every 6 (six) hours as needed for cough.  30 capsule  1  . budesonide-formoterol (SYMBICORT) 80-4.5 MCG/ACT inhaler Inhale 2 puffs into the lungs 2 (two) times daily.  1 Inhaler  11  . calcium citrate-vitamin D (CITRACAL+D) 315-200 MG-UNIT per tablet Take 1 tablet by mouth daily.      . fluticasone (FLONASE) 50 MCG/ACT nasal spray Place 1 spray into both nostrils daily as needed for allergies or rhinitis.      . hydrochlorothiazide (HYDRODIURIL) 25 MG tablet Take 25 mg by mouth daily.      . hydrocortisone (ANUSOL-HC) 2.5 % rectal cream Place 1 application rectally 2 (two) times daily.      [name]. ipratropium (ATROVENT HFA) 17 MCG/ACT inhaler Inhale 2 puffs into the lungs 4 (four) times daily.  1 Inhaler  0  . meclizine (ANTIVERT) 25 MG tablet Take 1 tablet (25 mg total) by mouth 3 (three) times daily as needed for dizziness.  30 tablet  1  . metoprolol tartrate (LOPRESSOR) 25 MG tablet  Take 1 tablet (25 mg total) by mouth 2 (two) times daily.  120 tablet  3  . montelukast (SINGULAIR) 10 MG tablet Take 1 tablet (10 mg total) by mouth at bedtime.  30 tablet  5  . Multiple Vitamins-Minerals (CENTRUM SILVER ULTRA WOMENS) TABS Take 1 tablet by mouth daily.      [name] omeprazole (PRILOSEC) 20 MG capsule Take 20 mg by mouth daily as needed (acid reflex).       . pravastatin (PRAVACHOL) 80 MG tablet Take 1 tablet (80 mg total) by mouth daily.  30 tablet  5  . predniSONE (DELTASONE) 50 MG tablet Take 1 tablet (50 mg total) by  mouth daily.  5 tablet  0  . warfarin (COUMADIN) 4 MG tablet Take 2-4 mg by mouth See admin instructions. Take 2 mg on Monday and Thursday. All the rest of the days patient takes 4 mg       No current facility-administered medications for this visit.   No family history on file. History   Social History  . Marital Status: Divorced    Spouse Name: N/A    Number of Children: N/A  . Years of Education: N/A   Social History Main Topics  . Smoking status: Former Games developer  . Smokeless tobacco: Not on file  . Alcohol Use: No  . Drug Use: No  . Sexual Activity: Not on file   Other Topics Concern  . Not on file   Social History Narrative  . No narrative on file    Review of Systems: Full 14-point review of systems otherwise negative. See HPI.   Objective:  Physical Exam: Filed Vitals:   03/04/13 1317  BP: 130/72  Pulse: 76  Temp: 97.6 F (36.4 C)  TempSrc: Oral  Height: 5\' 2"  (1.575 m)  Weight: 289 lb 6.4 oz (131.271 kg)  SpO2: 96%   Constitutional: Vital signs reviewed.  Patient is a well-developed and well-nourished, in no acute distress and cooperative with exam.   HEENT:  Head: Normocephalic and atraumatic Mouth: no erythema or exudates, MMM Eyes: PERRL, EOMI, conjunctivae normal, No scleral icterus.  Neck: Supple, Trachea midline normal ROM, No JVD  Cardiovascular: RRR, S1 normal, S2 normal, no MRG, pulses symmetric and intact bilaterally Pulmonary/Chest: Good air movement bilaterally, there is mild expiratory wheezing bilaterally. No rales.  Abdominal: Soft. Non-tender, non-distended, bowel sounds are normal, no masses, organomegaly, or guarding present.  GU: no CVA tenderness Musculoskeletal: No joint deformities, erythema, or stiffness, ROM full and non-tender Extremities: No leg edema Hematology: no cervical, inginal, or axillary adenopathy.  Neurological: A&O x3, Strength is normal and symmetric bilaterally, cranial nerve II-XII are grossly intact, no focal  motor deficit, sensory intact to light touch bilaterally.  Skin: Warm, dry and intact. No rash, cyanosis, or clubbing.  Psychiatric: Normal mood and affect. No suicidal or homicidal ideation.  Assessment & Plan:

## 2013-03-04 NOTE — Assessment & Plan Note (Signed)
Patient symptoms are mostly caused by mild asthma exacerbation. In consistent with this diagnosis, her lung auscultation has mild expiratory wheezing. Currently patient is hemodynamically stable. Oxygen saturation is 96% on room air. No fever, chills or chest pain, unlikely to have PNA or PE.   -will treat the patient with prednisone 50 mg for 5 days -Will treat with short-term of Atrovent inhaler for 7-14 days. -Tessalon for cough.

## 2013-03-04 NOTE — Progress Notes (Signed)
Anti-Coagulation Progress Note  Caroline [name] is a 68 y.o. female who is currently on an anti-coagulation regimen.    RECENT RESULTS: Recent results are below, the most recent result is correlated with a dose of 24 mg. per week: Lab Results  Component Value Date   INR 2.20 03/04/2013   INR 2.50 02/04/2013   INR 3.40 01/07/2013    ANTI-COAG DOSE: Anticoagulation Dose Instructions as of 03/04/2013     [name] Tue Wed Thu Fri Sat   New Dose 4 mg 2 mg 4 mg 2 mg 4 mg 2 mg 4 mg       ANTICOAG SUMMARY: Anticoagulation Episode Summary   Current INR goal 2.0-3.0  Next INR check 04/01/2013  INR from last check 2.20 (03/04/2013)  Weekly max dose   Target end date Indefinite  [facility] location [facility]  Preferred lab   Send INR reminders to    Indications  PULMONARY EMBOLISM HX OF [V12.51] Encounter for long-term (current) use of anticoagulants [V58.61]        Comments 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 The first she describes as "a blood clot in my leg"; the second she describes as "a blood clot in my lung". Only the PE is documented on her problem list as "PE, History of". States she has been on warfarin for "10 years".         ANTICOAG TODAY: Anticoagulation Summary as of 03/04/2013   INR goal 2.0-3.0  Selected INR 2.20 (03/04/2013)  Next INR check 04/01/2013  Target end date Indefinite   Indications  PULMONARY EMBOLISM HX OF [V12.51] Encounter for long-term (current) use of anticoagulants [V58.61]      Anticoagulation Episode Summary   [facility]   Preferred lab    Send INR reminders to    Comments 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 The first she describes as "a blood clot in my leg"; the second she describes as "a blood clot in my lung". Only the PE is documented on her problem list as "PE, History of". States she has been on warfarin for "10 years".       PATIENT INSTRUCTIONS: Patient  Instructions  Patient instructed to take medications as defined in the Anti-coagulation Track section of this encounter.  Patient instructed to take today's dose.  Patient verbalized understanding of these instructions.       FOLLOW-UP Return in about 4 weeks (around 04/01/2013) for Follow up INR at 1015h.  Hulen Luster, III Pharm.D., CACP

## 2013-03-05 NOTE — Progress Notes (Signed)
INTERNAL MEDICINE TEACHING ATTENDING ADDENDUM - Adonnis Salceda, DO: I reviewed and discussed at the time of visit with the resident Dr. Niu, the patient's medical history, physical examination, diagnosis and results of tests and treatment and I agree with the patient's care as documented. 

## 2013-03-19 ENCOUNTER — Other Ambulatory Visit: Payer: Self-pay | Admitting: *Deleted

## 2013-03-19 DIAGNOSIS — K649 Unspecified hemorrhoids: Secondary | ICD-10-CM

## 2013-03-19 MED ORDER — HYDROCORTISONE 2.5 % RE CREA: 1.0000 "application " | TOPICAL_CREAM | Freq: Two times a day (BID) | RECTAL | Status: DC

## 2013-03-24 ENCOUNTER — Other Ambulatory Visit: Payer: Self-pay | Admitting: Internal Medicine

## 2013-03-24 DIAGNOSIS — J45909 Unspecified asthma, uncomplicated: Secondary | ICD-10-CM

## 2013-04-01 ENCOUNTER — Ambulatory Visit (INDEPENDENT_AMBULATORY_CARE_PROVIDER_SITE_OTHER): Payer: Medicare Other | Admitting: Pharmacist

## 2013-04-01 DIAGNOSIS — Z7901 Long term (current) use of anticoagulants: Secondary | ICD-10-CM

## 2013-04-01 DIAGNOSIS — Z86718 Personal history of other venous thrombosis and embolism: Secondary | ICD-10-CM

## 2013-04-01 LAB — POCT INR: INR: 2.6

## 2013-04-01 NOTE — Progress Notes (Signed)
Indication: Recurrent thromboembolism.  Duration: Lifelong.  INR: At target.  Agree with Dr. Groce's assessment and plan. 

## 2013-04-01 NOTE — Progress Notes (Signed)
Anti-Coagulation Progress Note  Caroline [name] is a 68 y.o. female who is currently on an anti-coagulation regimen.    RECENT RESULTS: Recent results are below, the most recent result is correlated with a dose of 22 mg. per week: Lab Results  Component Value Date   INR 2.60 04/01/2013   INR 2.20 03/04/2013   INR 2.50 02/04/2013    ANTI-COAG DOSE: Anticoagulation Dose Instructions as of 04/01/2013     [name] Tue Wed Thu Fri Sat   New Dose 4 mg 2 mg 4 mg 2 mg 4 mg 2 mg 4 mg       ANTICOAG SUMMARY: Anticoagulation Episode Summary   Current INR goal 2.0-3.0  Next INR check 04/29/2013  INR from last check 2.60 (04/01/2013)  Weekly max dose   Target end date Indefinite  [facility] check location [facility]  Preferred lab   Send INR reminders to    Indications  PULMONARY EMBOLISM HX OF [V12.51] Encounter for long-term (current) use of anticoagulants [V58.61]        Comments 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 The first she describes as "a blood clot in my leg"; the second she describes as "a blood clot in my lung". Only the PE is documented on her problem list as "PE, History of". States she has been on warfarin for "10 years".         ANTICOAG TODAY: Anticoagulation Summary as of 04/01/2013   INR goal 2.0-3.0  Selected INR 2.60 (04/01/2013)  Next INR check 04/29/2013  Target end date Indefinite   Indications  PULMONARY EMBOLISM HX OF [V12.51] Encounter for long-term (current) use of anticoagulants [V58.61]      Anticoagulation Episode Summary   [facility]   Preferred lab    Send INR reminders to    Comments 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 The first she describes as "a blood clot in my leg"; the second she describes as "a blood clot in my lung". Only the PE is documented on her problem list as "PE, History of". States she has been on warfarin for "10 years".       PATIENT INSTRUCTIONS: Patient  Instructions  Patient instructed to take medications as defined in the Anti-coagulation Track section of this encounter.  Patient instructed to take today's dose.  Patient verbalized understanding of these instructions.       FOLLOW-UP Return in 4 weeks (on 04/29/2013) for Follow up INR at 1030h.  Hulen Luster, III Pharm.D., CACP

## 2013-04-01 NOTE — Patient Instructions (Signed)
Patient instructed to take medications as defined in the Anti-coagulation Track section of this encounter.  Patient instructed to take today's dose.  Patient verbalized understanding of these instructions.    

## 2013-04-25 ENCOUNTER — Telehealth: Payer: Self-pay | Admitting: Pharmacist

## 2013-04-25 NOTE — Telephone Encounter (Signed)
Re-scheduled anticoagulation management clinic appointment from April 29, 2013 to May 06, 2013--same time. Spoke with patient.  

## 2013-04-29 ENCOUNTER — Ambulatory Visit: Payer: Medicare Other

## 2013-04-30 ENCOUNTER — Other Ambulatory Visit: Payer: Self-pay | Admitting: Internal Medicine

## 2013-04-30 DIAGNOSIS — I2699 Other pulmonary embolism without acute cor pulmonale: Secondary | ICD-10-CM

## 2013-05-06 ENCOUNTER — Ambulatory Visit (INDEPENDENT_AMBULATORY_CARE_PROVIDER_SITE_OTHER): Payer: Medicare Other | Admitting: Pharmacist

## 2013-05-06 DIAGNOSIS — Z86718 Personal history of other venous thrombosis and embolism: Secondary | ICD-10-CM

## 2013-05-06 DIAGNOSIS — Z7901 Long term (current) use of anticoagulants: Secondary | ICD-10-CM

## 2013-05-06 DIAGNOSIS — I2699 Other pulmonary embolism without acute cor pulmonale: Secondary | ICD-10-CM

## 2013-05-06 LAB — POCT INR: INR: 3.4

## 2013-05-06 MED ORDER — WARFARIN SODIUM 4 MG PO TABS: ORAL_TABLET | ORAL | Status: DC

## 2013-05-06 NOTE — Progress Notes (Signed)
Anti-Coagulation Progress Note  Caroline [name] is a 68 y.o. female who is currently on an anti-coagulation regimen.    RECENT RESULTS: Recent results are below, the most recent result is correlated with a dose of 22 mg. per week: Lab Results  Component Value Date   INR 3.40 05/06/2013   INR 2.60 04/01/2013   INR 2.20 03/04/2013    ANTI-COAG DOSE: Anticoagulation Dose Instructions as of 05/06/2013     [name] Tue Wed Thu Fri Sat   New Dose 4 mg 2 mg 2 mg 2 mg 4 mg 2 mg 4 mg       ANTICOAG SUMMARY: Anticoagulation Episode Summary   Current INR goal 2.0-3.0  Next INR check 06/10/2013  INR from last check 3.40! (05/06/2013)  Weekly max dose   Target end date Indefinite  [facility]  Preferred lab   Send INR reminders to    Indications  PULMONARY EMBOLISM HX OF [V12.51] Encounter for long-term (current) use of anticoagulants [V58.61]        Comments 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 The first she describes as "a blood clot in my leg"; the second she describes as "a blood clot in my lung". Only the PE is documented on her problem list as "PE, History of". States she has been on warfarin for "10 years".         ANTICOAG TODAY: Anticoagulation Summary as of 05/06/2013   INR goal 2.0-3.0  Selected INR 3.40! (05/06/2013)  Next INR check 06/10/2013  Target end date Indefinite   Indications  PULMONARY EMBOLISM HX OF [V12.51] Encounter for long-term (current) use of anticoagulants [V58.61]      Anticoagulation Episode Summary   [facility]   Preferred lab    Send INR reminders to    Comments 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 The first she describes as "a blood clot in my leg"; the second she describes as "a blood clot in my lung". Only the PE is documented on her problem list as "PE, History of". States she has been on warfarin for "10 years".       PATIENT INSTRUCTIONS: Patient  Instructions  Patient instructed to take medications as defined in the Anti-coagulation Track section of this encounter.  Patient instructed to take today's dose.  Patient verbalized understanding of these instructions.       FOLLOW-UP Return in 5 weeks (on 06/10/2013) for Follow up INR at 1015h.  Hulen Luster, III Pharm.D., CACP

## 2013-05-06 NOTE — Patient Instructions (Signed)
Patient instructed to take medications as defined in the Anti-coagulation Track section of this encounter.  Patient instructed to take today's dose.  Patient verbalized understanding of these instructions.    

## 2013-05-17 ENCOUNTER — Other Ambulatory Visit: Payer: Self-pay | Admitting: Internal Medicine

## 2013-05-17 DIAGNOSIS — R42 Dizziness and giddiness: Secondary | ICD-10-CM

## 2013-05-21 ENCOUNTER — Ambulatory Visit (INDEPENDENT_AMBULATORY_CARE_PROVIDER_SITE_OTHER): Payer: PRIVATE HEALTH INSURANCE | Admitting: Internal Medicine

## 2013-05-21 ENCOUNTER — Encounter: Payer: Self-pay | Admitting: Internal Medicine

## 2013-05-21 VITALS — BP 130/70 | HR 76 | Temp 97.7°F | Ht 62.0 in | Wt 293.3 lb

## 2013-05-21 DIAGNOSIS — R42 Dizziness and giddiness: Secondary | ICD-10-CM

## 2013-05-21 DIAGNOSIS — Z0000 Encounter for general adult medical examination without abnormal findings: Secondary | ICD-10-CM

## 2013-05-21 DIAGNOSIS — J45909 Unspecified asthma, uncomplicated: Secondary | ICD-10-CM

## 2013-05-21 DIAGNOSIS — I10 Essential (primary) hypertension: Secondary | ICD-10-CM

## 2013-05-21 DIAGNOSIS — K649 Unspecified hemorrhoids: Secondary | ICD-10-CM

## 2013-05-21 DIAGNOSIS — J45901 Unspecified asthma with (acute) exacerbation: Secondary | ICD-10-CM

## 2013-05-21 DIAGNOSIS — Z1239 Encounter for other screening for malignant neoplasm of breast: Secondary | ICD-10-CM

## 2013-05-21 DIAGNOSIS — K219 Gastro-esophageal reflux disease without esophagitis: Secondary | ICD-10-CM

## 2013-05-21 DIAGNOSIS — I82409 Acute embolism and thrombosis of unspecified deep veins of unspecified lower extremity: Secondary | ICD-10-CM

## 2013-05-21 MED ORDER — HYDROCORTISONE 2.5 % RE CREA: 1.0000 "application " | TOPICAL_CREAM | Freq: Two times a day (BID) | RECTAL | Status: DC

## 2013-05-21 MED ORDER — IPRATROPIUM BROMIDE HFA 17 MCG/ACT IN AERS: 2.0000 | INHALATION_SPRAY | Freq: Four times a day (QID) | RESPIRATORY_TRACT | Status: DC

## 2013-05-21 MED ORDER — OMEPRAZOLE 20 MG PO CPDR: 20.0000 mg | DELAYED_RELEASE_CAPSULE | Freq: Every day | ORAL | Status: DC | PRN

## 2013-05-21 MED ORDER — MECLIZINE HCL 25 MG PO TABS: 25.0000 mg | ORAL_TABLET | Freq: Three times a day (TID) | ORAL | Status: DC | PRN

## 2013-05-21 NOTE — Assessment & Plan Note (Signed)
-   Patient refused colonoscopy. Will giive hemoccult cards x 3.  - will order to mammogram  (patient would like to mammogram August)

## 2013-05-21 NOTE — Assessment & Plan Note (Signed)
Patient is currently taking Coumadin. Her INR is 3.4 on 05/06/13. She does not have bleeding tendency. Patient does not have chest pain, shortness of breath, or signs of DVT. Will continue coumadin.

## 2013-05-21 NOTE — Assessment & Plan Note (Addendum)
BP Readings from Last 3 Encounters:  05/21/13 130/70  03/04/13 130/72  02/19/13 135/77    Lab Results  Component Value Date   NA 143 02/19/2013   K 3.7 02/19/2013   CREATININE 0.68 02/19/2013    Assessment: Blood pressure control: controlled Progress toward BP goal:  at goal Comments:   Plan: Medications:  continue current medications Educational resources provided: brochure Self management tools provided:   Other plans: will continue metoprolol 25 mg bid and hydrochlorothiazide 25 mg daily.

## 2013-05-21 NOTE — Patient Instructions (Signed)
1. You have done great job in taking all your medications. I appreciate it very much. Please continue doing that. 2. Please take all medications as prescribed.  3. If you have worsening of your symptoms or new symptoms arise, please call the clinic ([phone]), or go to the ER immediately if symptoms are severe.  Please bring in all your medication bottles with you in next visit.    

## 2013-05-21 NOTE — Progress Notes (Signed)
Patient ID: Caroline [name], female   DOB: 06/29/1945, 68 y.o.   MRN: [id number] Subjective:   Patient ID: Caroline [name] female   DOB: 03/02/1945 68 y.o.   MRN: [id number]  CC:   Follow up visit.      HPI:  Caroline [name] is a 68 y.o. man lady with past medical history as outlined below, who presents for a followup visit today  1). Asthma: Patient is currently taking albuterol inhaler prn, symbicor inhaler, Atrovent inhaler and Singulair at home. Recently she had a mild cough which resolved completely. Today, patient does not have chest pain, shortness of breath or wheezing. She feels good.  2.) Hypertension: Patient is currently taking metoprolol and hydrochlorothiazide. Today her blood pressure is 130/70 mmHg. she does not have chest pain, shortness of breath, leg edema or palpitation.  3.) History for pulmonary embolism and DVT: Patient is currently taking Coumadin. Her INR is 3.4 on 05/06/13. She does not have bleeding tendency. Patient does not have chest pain, shortness of breath, or signs of DVT. There is no tenderness over her calf areas bilaterally.  ROS: Patient has some wheezing, cough and mild shortness of breath. Denies fever, chills, fatigue, headaches, chest pain, abdominal pain, diarrhea, constipation, dysuria, urgency, frequency, hematuria    Past Medical History  Diagnosis Date  . Hypertension   . Diabetes mellitus   . Vertigo   . DVT, lower extremity, recurrent     Patient had unprovoked PE on 2002 and DVT in right lower extremety 2008.  . PE (pulmonary embolism)     Patient had unprovoked PE on 2002   Current Outpatient Prescriptions  Medication Sig Dispense Refill  . acetaminophen (TYLENOL) 500 MG tablet Take 500 mg by mouth every 6 (six) hours as needed for mild pain or moderate pain. pain      . benzonatate (TESSALON PERLES) 100 MG capsule Take 1 capsule (100 mg total) by mouth every 6 (six) hours as needed for cough.  30 capsule  1  . budesonide-formoterol (SYMBICORT)  80-4.5 MCG/ACT inhaler Inhale 2 puffs into the lungs 2 (two) times daily.  1 Inhaler  11  . calcium citrate-vitamin D (CITRACAL+D) 315-200 MG-UNIT per tablet Take 1 tablet by mouth daily.      . fluticasone (FLONASE) 50 MCG/ACT nasal spray Place 1 spray into both nostrils daily as needed for allergies or rhinitis.      . hydrochlorothiazide (HYDRODIURIL) 25 MG tablet Take 25 mg by mouth daily.      . hydrocortisone (ANUSOL-HC) 2.5 % rectal cream Place 1 application rectally 2 (two) times daily.  30 g  5  . ipratropium (ATROVENT HFA) 17 MCG/ACT inhaler Inhale 2 puffs into the lungs 4 (four) times daily.  1 Inhaler  0  . meclizine (ANTIVERT) 25 MG tablet Take 1 tablet (25 mg total) by mouth 3 (three) times daily as needed for dizziness.  30 tablet  1  . meclizine (ANTIVERT) 25 MG tablet TAKE 1 TABLET BY MOUTH 3 TIMES A DAY AS NEEDED  90 tablet  1  . metoprolol tartrate (LOPRESSOR) 25 MG tablet Take 1 tablet (25 mg total) by mouth 2 (two) times daily.  120 tablet  3  . montelukast (SINGULAIR) 10 MG tablet Take 1 tablet (10 mg total) by mouth at bedtime.  30 tablet  5  . Multiple Vitamins-Minerals (CENTRUM SILVER ULTRA WOMENS) TABS Take 1 tablet by mouth daily.      [name]. omeprazole (PRILOSEC) 20 MG capsule Take  20 mg by mouth daily as needed (acid reflex).       . pravastatin (PRAVACHOL) 80 MG tablet Take 1 tablet (80 mg total) by mouth daily.  30 tablet  5  . predniSONE (DELTASONE) 50 MG tablet Take 1 tablet (50 mg total) by mouth daily.  5 tablet  0  . PROAIR HFA 108 (90 BASE) MCG/ACT inhaler INHALE 2 PUFFS INTO LUNGS EVERY 6 HOURS AS NEEDED FOR SHORTNESS OF BREATH  8.5 each  5  . warfarin (COUMADIN) 4 MG tablet Take 1/2 tablet on Mondays/Tuesdays/Wednesdays/Fridays. Take 1 tablet on Sundays/Thursdays/Saturdays.  20 tablet  2  . warfarin (COUMADIN) 4 MG tablet Take 1/2 tablet on Mondays/Tuesdays/Wednesdays and Fridays. Take 1 tablet on Sundays/Thursdays/Saturdays.  20 tablet  2   No current  facility-administered medications for this visit.   No family history on file. History   Social History  . Marital Status: Divorced    Spouse Name: N/A    Number of Children: N/A  . Years of Education: N/A   Social History Main Topics  . Smoking status: Former Games developer  . Smokeless tobacco: None  . Alcohol Use: No  . Drug Use: No  . Sexual Activity: None   Other Topics Concern  . None   Social History Narrative  . None    Review of Systems: Full 14-point review of systems otherwise negative. See HPI.   Objective:  Physical Exam: Filed Vitals:   05/21/13 1405  BP: 130/70  Pulse: 76  Temp: 97.7 F (36.5 C)  TempSrc: Oral  SpO2: 97%   Constitutional: Vital signs reviewed.  Patient is a well-developed and well-nourished, in no acute distress and cooperative with exam.   HEENT:  Head: Normocephalic and atraumatic Mouth: no erythema or exudates, MMM Eyes: PERRL, EOMI, conjunctivae normal, No scleral icterus.  Neck: Supple, Trachea midline normal ROM, No JVD  Cardiovascular: RRR, S1 normal, S2 normal, no MRG, pulses symmetric and intact bilaterally Pulmonary/Chest: Good air movement bilaterally, CTAB.  Abdominal: Soft. Non-tender, non-distended, bowel sounds are normal, no masses, organomegaly, or guarding present.   GU: no CVA tenderness Musculoskeletal: No joint deformities, erythema, or stiffness, ROM full and non-tender Extremities: No leg edema Hematology: no cervical, inginal, or axillary adenopathy.  Neurological: A&O x3, Strength is normal and symmetric bilaterally, cranial nerve II-XII are grossly intact, no focal motor deficit, sensory intact to light touch bilaterally.  Skin: Warm, dry and intact. No rash, cyanosis, or clubbing.  Psychiatric: Normal mood and affect. No suicidal or homicidal ideation.   Assessment & Plan:

## 2013-05-21 NOTE — Assessment & Plan Note (Signed)
It is stable. No signs of acute exacerbation. Lungs is clear to auscultation bilaterally. Will continue albuterol inhaler prn, symbicor inhaler, Atrovent inhaler and Singulair.

## 2013-05-22 ENCOUNTER — Encounter: Payer: Medicare Other | Admitting: Internal Medicine

## 2013-05-25 NOTE — Progress Notes (Signed)
Case discussed with Dr. Niu at the time of the visit.  We reviewed the resident's history and exam and pertinent patient test results.  I agree with the assessment, diagnosis, and plan of care documented in the resident's note.    

## 2013-06-10 ENCOUNTER — Ambulatory Visit (INDEPENDENT_AMBULATORY_CARE_PROVIDER_SITE_OTHER): Payer: PRIVATE HEALTH INSURANCE | Admitting: Pharmacist

## 2013-06-10 DIAGNOSIS — Z7901 Long term (current) use of anticoagulants: Secondary | ICD-10-CM

## 2013-06-10 DIAGNOSIS — Z86718 Personal history of other venous thrombosis and embolism: Secondary | ICD-10-CM

## 2013-06-10 LAB — POCT INR: INR: 3

## 2013-06-10 NOTE — Patient Instructions (Signed)
Patient instructed to take medications as defined in the Anti-coagulation Track section of this encounter.  Patient instructed to take today's dose.  Patient verbalized understanding of these instructions.    

## 2013-06-10 NOTE — Progress Notes (Signed)
Anti-Coagulation Progress Note  Caroline [name] is a 68 y.o. female who reports to the clinic for monitoring of anticoagulation treatment.    RECENT RESULTS: Recent results are below, the most recent result is correlated with a dose of 20 mg. per week (no changes): Lab Results  Component Value Date   INR 3.0 06/10/2013   INR 3.40 05/06/2013   INR 2.60 04/01/2013    ANTI-COAG DOSE: Anticoagulation Dose Instructions as of 06/10/2013     [name] Tue Wed Thu Fri Sat   New Dose 4 mg 2 mg 2 mg 2 mg 4 mg 2 mg 4 mg       ANTICOAG SUMMARY: Anticoagulation Episode Summary   Current INR goal 2.0-3.0  Next INR check 07/01/2013  INR from last check 3.0 (06/10/2013)  Weekly max dose   Target end date Indefinite  [facility] location [facility]  Preferred lab   Send INR reminders to    Indications  PULMONARY EMBOLISM HX OF [V12.51] Encounter for long-term (current) use of anticoagulants [V58.61]        Comments 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 The first she describes as "a blood clot in my leg"; the second she describes as "a blood clot in my lung". Only the PE is documented on her problem list as "PE, History of". States she has been on warfarin for "10 years".         ANTICOAG TODAY: Anticoagulation Summary as of 06/10/2013   INR goal 2.0-3.0  Selected INR 3.0 (06/10/2013)  Next INR check 07/01/2013  Target end date Indefinite   Indications  PULMONARY EMBOLISM HX OF [V12.51] Encounter for long-term (current) use of anticoagulants [V58.61]      Anticoagulation Episode Summary   [facility] check location [facility]   Preferred lab    Send INR reminders to    Comments 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 The first she describes as "a blood clot in my leg"; the second she describes as "a blood clot in my lung". Only the PE is documented on her problem list as "PE, History of". States she has been on warfarin for "10 years".       PATIENT  INSTRUCTIONS: Patient Instructions  Patient instructed to take medications as defined in the Anti-coagulation Track section of this encounter.  Patient instructed to take today's dose.  Patient verbalized understanding of these instructions.     FOLLOW-UP Return in about 3 weeks (around 07/01/2013) for Follow up INR at 1100.  Marzetta Board, PharmD BCPS, BCACP

## 2013-06-19 ENCOUNTER — Other Ambulatory Visit: Payer: Self-pay | Admitting: Internal Medicine

## 2013-06-19 DIAGNOSIS — R0602 Shortness of breath: Secondary | ICD-10-CM

## 2013-06-26 ENCOUNTER — Encounter: Payer: Self-pay | Admitting: Internal Medicine

## 2013-06-26 LAB — HEMOCCULT SLIDES (X 3 CARDS)

## 2013-07-01 ENCOUNTER — Ambulatory Visit (INDEPENDENT_AMBULATORY_CARE_PROVIDER_SITE_OTHER): Payer: PRIVATE HEALTH INSURANCE | Admitting: Pharmacist

## 2013-07-01 DIAGNOSIS — Z86718 Personal history of other venous thrombosis and embolism: Secondary | ICD-10-CM

## 2013-07-01 DIAGNOSIS — Z7901 Long term (current) use of anticoagulants: Secondary | ICD-10-CM

## 2013-07-01 LAB — POCT INR: INR: 2.8

## 2013-07-01 NOTE — Patient Instructions (Signed)
Patient instructed to take medications as defined in the Anti-coagulation Track section of this encounter.  Patient instructed to take today's dose.  Patient verbalized understanding of these instructions.    

## 2013-07-01 NOTE — Progress Notes (Signed)
Anti-Coagulation Progress Note  Caroline [name] is a 68 y.o. female who is currently on an anti-coagulation regimen.    RECENT RESULTS: Recent results are below, the most recent result is correlated with a dose of 20 mg. per week: Lab Results  Component Value Date   INR 2.80 07/01/2013   INR 3.0 06/10/2013   INR 3.40 05/06/2013    ANTI-COAG DOSE: Anticoagulation Dose Instructions as of 07/01/2013     [name] Tue Wed Thu Fri Sat   New Dose 2 mg 4 mg 2 mg 4 mg 2 mg 4 mg 2 mg       ANTICOAG SUMMARY: Anticoagulation Episode Summary   Current INR goal 2.0-3.0  Next INR check 07/29/2013  INR from last check 2.80 (07/01/2013)  Weekly max dose   Target end date Indefinite  [facility] check location [facility]  Preferred lab   Send INR reminders to    Indications  PULMONARY EMBOLISM HX OF [V12.51] Encounter for long-term (current) use of anticoagulants [V58.61]        Comments 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 The first she describes as "a blood clot in my leg"; the second she describes as "a blood clot in my lung". Only the PE is documented on her problem list as "PE, History of". States she has been on warfarin for "10 years".         ANTICOAG TODAY: Anticoagulation Summary as of 07/01/2013   INR goal 2.0-3.0  Selected INR 2.80 (07/01/2013)  Next INR check 07/29/2013  Target end date Indefinite   Indications  PULMONARY EMBOLISM HX OF [V12.51] Encounter for long-term (current) use of anticoagulants [V58.61]      Anticoagulation Episode Summary   [facility] check location [facility]   Preferred lab    Send INR reminders to    Comments 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 The first she describes as "a blood clot in my leg"; the second she describes as "a blood clot in my lung". Only the PE is documented on her problem list as "PE, History of". States she has been on warfarin for "10 years".       PATIENT INSTRUCTIONS: Patient Instructions   Patient instructed to take medications as defined in the Anti-coagulation Track section of this encounter.  Patient instructed to take today's dose.  Patient verbalized understanding of these instructions.       FOLLOW-UP Return in 4 weeks (on 07/29/2013) for Follow up INR at 1115h.  Hulen Luster, III Pharm.D., CACP

## 2013-07-01 NOTE — Progress Notes (Signed)
INTERNAL MEDICINE TEACHING ATTENDING ADDENDUM - [name] M.D  Duration- indefinite, Indication- PE, INR- therapeutic. Agree with Dr. Groce's recommendations as outlined in his note.      

## 2013-07-03 ENCOUNTER — Other Ambulatory Visit: Payer: Self-pay | Admitting: Internal Medicine

## 2013-07-20 ENCOUNTER — Other Ambulatory Visit: Payer: Self-pay | Admitting: Internal Medicine

## 2013-07-29 ENCOUNTER — Ambulatory Visit (INDEPENDENT_AMBULATORY_CARE_PROVIDER_SITE_OTHER): Payer: PRIVATE HEALTH INSURANCE | Admitting: Pharmacist

## 2013-07-29 DIAGNOSIS — Z86718 Personal history of other venous thrombosis and embolism: Secondary | ICD-10-CM

## 2013-07-29 DIAGNOSIS — Z7901 Long term (current) use of anticoagulants: Secondary | ICD-10-CM

## 2013-07-29 LAB — POCT INR: INR: 2

## 2013-07-29 NOTE — Progress Notes (Signed)
Anti-Coagulation Progress Note  Caroline [name] is a 67 y.o. female who is currently on an anti-coagulation regimen.    RECENT RESULTS: Recent results are below, the most recent result is correlated with a dose of 20 mg. per week: Lab Results  Component Value Date   INR 2.00 07/29/2013   INR 2.80 07/01/2013   INR 3.0 06/10/2013    ANTI-COAG DOSE: Anticoagulation Dose Instructions as of 07/29/2013     [name] Tue Wed Thu Fri Sat   New Dose 2 mg 4 mg 4 mg 4 mg 2 mg 4 mg 2 mg       ANTICOAG SUMMARY: Anticoagulation Episode Summary   Current INR goal 2.0-3.0  Next INR check 08/26/2013  INR from last check 2.00 (07/29/2013)  Weekly max dose   Target end date Indefinite  [facility] location [facility]  Preferred lab   Send INR reminders to    Indications  PULMONARY EMBOLISM HX OF [V12.51] Encounter for long-term (current) use of anticoagulants [V58.61]        Comments 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 The first she describes as "a blood clot in my leg"; the second she describes as "a blood clot in my lung". Only the PE is documented on her problem list as "PE, History of". States she has been on warfarin for "10 years".         ANTICOAG TODAY: Anticoagulation Summary as of 07/29/2013   INR goal 2.0-3.0  Selected INR 2.00 (07/29/2013)  Next INR check 08/26/2013  Target end date Indefinite   Indications  PULMONARY EMBOLISM HX OF [V12.51] Encounter for long-term (current) use of anticoagulants [V58.61]      Anticoagulation Episode Summary   [facility] check location [facility]   Preferred lab    Send INR reminders to    Comments 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 The first she describes as "a blood clot in my leg"; the second she describes as "a blood clot in my lung". Only the PE is documented on her problem list as "PE, History of". States she has been on warfarin for "10 years".       PATIENT INSTRUCTIONS: Patient Instructions   Patient instructed to take medications as defined in the Anti-coagulation Track section of this encounter.  Patient instructed to take today's dose.  Patient verbalized understanding of these instructions.       FOLLOW-UP Return in 4 weeks (on 08/26/2013) for Follow up INR at 1130h.  Hulen Luster, III Pharm.D., CACP

## 2013-07-29 NOTE — Patient Instructions (Signed)
Patient instructed to take medications as defined in the Anti-coagulation Track section of this encounter.  Patient instructed to take today's dose.  Patient verbalized understanding of these instructions.    

## 2013-07-30 ENCOUNTER — Other Ambulatory Visit: Payer: Self-pay | Admitting: Internal Medicine

## 2013-08-03 ENCOUNTER — Other Ambulatory Visit: Payer: Self-pay | Admitting: Internal Medicine

## 2013-08-15 ENCOUNTER — Other Ambulatory Visit: Payer: Self-pay | Admitting: Internal Medicine

## 2013-08-26 ENCOUNTER — Ambulatory Visit (HOSPITAL_COMMUNITY)
Admission: RE | Admit: 2013-08-26 | Discharge: 2013-08-26 | Disposition: A | Discharge status: Home / Self Care | Payer: PRIVATE HEALTH INSURANCE | Source: Ambulatory Visit | Attending: Internal Medicine | Admitting: Internal Medicine

## 2013-08-26 ENCOUNTER — Other Ambulatory Visit: Payer: Self-pay | Admitting: Internal Medicine

## 2013-08-26 ENCOUNTER — Ambulatory Visit (INDEPENDENT_AMBULATORY_CARE_PROVIDER_SITE_OTHER): Payer: Medicare Other | Admitting: Pharmacist

## 2013-08-26 ENCOUNTER — Ambulatory Visit (INDEPENDENT_AMBULATORY_CARE_PROVIDER_SITE_OTHER): Payer: PRIVATE HEALTH INSURANCE | Admitting: Internal Medicine

## 2013-08-26 VITALS — BP 146/93 | HR 81 | Temp 97.5°F | Wt 292.1 lb

## 2013-08-26 DIAGNOSIS — R42 Dizziness and giddiness: Secondary | ICD-10-CM | POA: Insufficient documentation

## 2013-08-26 DIAGNOSIS — Z86718 Personal history of other venous thrombosis and embolism: Secondary | ICD-10-CM

## 2013-08-26 DIAGNOSIS — Z86711 Personal history of pulmonary embolism: Secondary | ICD-10-CM | POA: Insufficient documentation

## 2013-08-26 DIAGNOSIS — I10 Essential (primary) hypertension: Secondary | ICD-10-CM | POA: Diagnosis not present

## 2013-08-26 DIAGNOSIS — J45909 Unspecified asthma, uncomplicated: Secondary | ICD-10-CM

## 2013-08-26 DIAGNOSIS — R079 Chest pain, unspecified: Secondary | ICD-10-CM | POA: Diagnosis not present

## 2013-08-26 DIAGNOSIS — Z79899 Other long term (current) drug therapy: Secondary | ICD-10-CM | POA: Diagnosis not present

## 2013-08-26 DIAGNOSIS — Z87898 Personal history of other specified conditions: Secondary | ICD-10-CM

## 2013-08-26 DIAGNOSIS — J4520 Mild intermittent asthma, uncomplicated: Secondary | ICD-10-CM

## 2013-08-26 DIAGNOSIS — I82409 Acute embolism and thrombosis of unspecified deep veins of unspecified lower extremity: Secondary | ICD-10-CM

## 2013-08-26 DIAGNOSIS — Z7901 Long term (current) use of anticoagulants: Secondary | ICD-10-CM

## 2013-08-26 LAB — POCT INR: INR: 2.6

## 2013-08-26 MED ORDER — MONTELUKAST SODIUM 10 MG PO TABS: 10.0000 mg | ORAL_TABLET | Freq: Every day | ORAL | Status: DC

## 2013-08-26 MED ORDER — OMEPRAZOLE 20 MG PO CPDR: 20.0000 mg | DELAYED_RELEASE_CAPSULE | Freq: Every day | ORAL | Status: DC

## 2013-08-26 MED ORDER — ALBUTEROL SULFATE HFA 108 (90 BASE) MCG/ACT IN AERS: 2.0000 | INHALATION_SPRAY | Freq: Four times a day (QID) | RESPIRATORY_TRACT | Status: DC | PRN

## 2013-08-26 MED ORDER — HYDROCHLOROTHIAZIDE 25 MG PO TABS: 25.0000 mg | ORAL_TABLET | Freq: Every day | ORAL | Status: DC

## 2013-08-26 NOTE — Assessment & Plan Note (Signed)
Asthma controlled, no acute asthma exacerbations - Continue current reigmen

## 2013-08-26 NOTE — Assessment & Plan Note (Signed)
INR therapeutic at 2.6 today. -Continue coumadin

## 2013-08-26 NOTE — Patient Instructions (Signed)
Patient instructed to take medications as defined in the Anti-coagulation Track section of this encounter.  Patient instructed to take today's dose.  Patient verbalized understanding of these instructions.    

## 2013-08-26 NOTE — Patient Instructions (Signed)
It was a pleasure taking care of you today, Caroline [name].  Here is what we discussed today:  1. High Blood Pressure - Continue hydrochlorothiazide 25 mg daily - Continue metoprolol 25 mg twice a day  2. History of blood clot in leg - Continue coumadin  3. Asthma - Continue Atrovent, Symbicort, ProAir, and Montelukast  4. Chest Pain with exercise - EKG today - Please return to clinic if chest pain occurs again - If chest pain severe, go to emergency room  5. Vertigo - Continue meclizine  General Instructions:   Please bring your medicines with you each time you come to clinic.  Medicines may include prescription medications, over-the-counter medications, herbal remedies, eye drops, vitamins, or other pills.   Progress Toward Treatment Goals:  Treatment Goal 05/21/2013  Blood pressure at goal    Self Care Goals & Plans:  Self Care Goal 03/04/2013  Manage my medications take my medicines as prescribed; bring my medications to every visit  Eat healthy foods eat more vegetables; eat foods that are low in salt; eat baked foods instead of fried foods  Be physically active take a walk every day; find an activity I enjoy    No flowsheet data found.   Care Management & Community Referrals:  Referral 10/30/2012  Referrals made for care management support none needed

## 2013-08-26 NOTE — Progress Notes (Signed)
INTERNAL MEDICINE TEACHING ATTENDING ADDENDUM - Earl Lagos, MD: I personally saw and evaluated Caroline [name] in this clinic visit in conjunction with the resident, Dr. Beckie Salts. I have discussed patient's plan of care with medical resident during this visit. I have confirmed the physical exam findings and have read and agree with the clinic note including the plan with the following addition: - Patient with intermittent CP. Last episode approx 2 months. 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

## 2013-08-26 NOTE — Progress Notes (Signed)
   Subjective:    Patient ID: Caroline [name], female    DOB: 08/02/1945, 68 y.o.   MRN: [id number]  HPI Caroline [name] is a 68yo woman w/ PMHx of HTN, HLD, hx of unprovoked PE in 2002 and RLE DVT in 2008 on Coumadin, asthma, and vertigo who presents today for the following:  1. HTN: BP today 146/93, has been in 130-140s/70-90s at previous visits. She takes HCTZ 25 mg daily and Metoprolol 25 mg BID at home. She denies changes in vision, sudden weakness, dizziness, and difficulties with urination.  2. Hx of PE and RLE DVT: Pt on Coumadin. INR today 2.6.   3. Asthma: Pt on Atrovent, Symbicort, Montelukast, and ProAir at home. She denies any wheezing, cough, and SOB.   4. Vertigo: Pt on Meclizine at home. Pt states Meclizine is controlling her symptoms.   5. Hx of Exertional Chest Pain: Pt reports a few months ago she had some exertional chest pain when exercising. She states the pain was in the middle of her chest and radiated downwards. She states the pain was relieved when she stopped exercising and also with Tylenol. The pain did not occur every time she exercised. The last time she felt the pain was 2 months ago. She has been exercising currently without any problems. She denies pain at rest, sweating, N/V, SOB, and palpitations. She does not have the pain while walking around the grocery store or [employer]. She has never had an echocardiogram or stress test before.    Review of Systems General: Denies fever, chills, night sweats, changes in weight, changes in appetite HEENT: Denies headaches, ear pain, rhinorrhea, sore throat CV: Denies orthopnea Pulm: See HPI GI: Denies abdominal pain, diarrhea, constipation, melena, hematochezia GU: Denies dysuria, hematuria, frequency Msk: Denies muscle cramps, joint pains Neuro: Denies numbness, tingling Skin: Denies rashes, bruising    Objective:   Physical Exam General: appears stated age, sitting up in chair, NAD HEENT: [place]/AT, EOMI, PERRL, sclera  anicteric, pharynx non-erythematous, mucus membranes moist Neck: supple, no JVD, no lymphadenopathy CV: RRR, normal S1/S2, no m/g/r Pulm: CTA bilaterally, breaths non-labored, no wheezing Abd: BS+, soft, non-distended, non-tender Ext: warm, no edema, moves all Neuro: alert and oriented x 3, CNs II-XII intact, strength 5/5 in upper and lower extremities bilaterally      Assessment & Plan:

## 2013-08-26 NOTE — Assessment & Plan Note (Signed)
BP Readings from Last 3 Encounters:  08/26/13 146/93  05/21/13 130/70  03/04/13 130/72    Lab Results  Component Value Date   NA 143 02/19/2013   K 3.7 02/19/2013   CREATININE 0.68 02/19/2013    Assessment: Blood pressure control: controlled Progress toward BP goal:  at goal Comments:   Plan: Medications:  continue current medications Educational resources provided:   Self management tools provided:   Other plans: Continue current regimen.

## 2013-08-26 NOTE — Assessment & Plan Note (Signed)
Pt reports Meclizine is controlling her symptoms. - Continue Meclizine

## 2013-08-26 NOTE — Progress Notes (Signed)
INTERNAL MEDICINE TEACHING ATTENDING ADDENDUM - Legaci Tarman M.D  Duration- indefinite, Indication- PE, INR- therapeutic. Agree with Dr. Groce's recommendations as outlined in his note.      

## 2013-08-26 NOTE — Progress Notes (Signed)
Anti-Coagulation Progress Note  Caroline [name] is a 68 y.o. female who is currently on an anti-coagulation regimen.    RECENT RESULTS: Recent results are below, the most recent result is correlated with a dose of 22 mg. per week: Lab Results  Component Value Date   INR 2.60 08/26/2013   INR 2.00 07/29/2013   INR 2.80 07/01/2013    ANTI-COAG DOSE: Anticoagulation Dose Instructions as of 08/26/2013     [name] Tue Wed Thu Fri Sat   New Dose 2 mg 4 mg 4 mg 4 mg 2 mg 4 mg 2 mg       ANTICOAG SUMMARY: Anticoagulation Episode Summary   Current INR goal 2.0-3.0  Next INR check 09/23/2013  INR from last check 2.60 (08/26/2013)  Weekly max dose   Target end date Indefinite  [facility]R check location [facility]  Preferred lab   Send INR reminders to    Indications  PULMONARY EMBOLISM HX OF [V12.51] Encounter for long-term (current) use of anticoagulants [V58.61]        Comments 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 The first she describes as "a blood clot in my leg"; the second she describes as "a blood clot in my lung". Only the PE is documented on her problem list as "PE, History of". States she has been on warfarin for "10 years".         ANTICOAG TODAY: Anticoagulation Summary as of 08/26/2013   INR goal 2.0-3.0  Selected INR 2.60 (08/26/2013)  Next INR check 09/23/2013  Target end date Indefinite   Indications  PULMONARY EMBOLISM HX OF [V12.51] Encounter for long-term (current) use of anticoagulants [V58.61]      Anticoagulation Episode Summary   [facility] check location [facility]   Preferred lab    Send INR reminders to    Comments 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 The first she describes as "a blood clot in my leg"; the second she describes as "a blood clot in my lung". Only the PE is documented on her problem list as "PE, History of". States she has been on warfarin for "10 years".       PATIENT INSTRUCTIONS: Patient  Instructions  Patient instructed to take medications as defined in the Anti-coagulation Track section of this encounter.  Patient instructed to take today's dose.  Patient verbalized understanding of these instructions.       FOLLOW-UP Return in 4 weeks (on 09/23/2013) for Follow up INR at 1015h.  Hulen Luster, III Pharm.D., CACP

## 2013-08-26 NOTE — Assessment & Plan Note (Signed)
Pt last had CP with exertion 2 months ago. Chest pain does not seem cardiac as she has not had the pain recently and can walk around the grocery store/[employer] without any problems. She has been exercising recently without any problems as well. - EKG today to get baseline - If CP begins again or worsens than previously can consider doing cardiac work up. She does have risk factors including HTN and HLD. However, will hold off for now since patient asymptomatic.

## 2013-08-31 ENCOUNTER — Other Ambulatory Visit: Payer: Self-pay | Admitting: Internal Medicine

## 2013-09-06 ENCOUNTER — Other Ambulatory Visit: Payer: Self-pay | Admitting: Internal Medicine

## 2013-09-23 ENCOUNTER — Ambulatory Visit (INDEPENDENT_AMBULATORY_CARE_PROVIDER_SITE_OTHER): Payer: PRIVATE HEALTH INSURANCE | Admitting: Pharmacist

## 2013-09-23 DIAGNOSIS — Z7901 Long term (current) use of anticoagulants: Secondary | ICD-10-CM

## 2013-09-23 DIAGNOSIS — Z86718 Personal history of other venous thrombosis and embolism: Secondary | ICD-10-CM

## 2013-09-23 LAB — POCT INR: INR: 2.1

## 2013-09-23 NOTE — Patient Instructions (Signed)
Patient instructed to take medications as defined in the Anti-coagulation Track section of this encounter.  Patient instructed to take today's dose.  Patient verbalized understanding of these instructions.    

## 2013-09-23 NOTE — Progress Notes (Signed)
Caroline [name] is a 68 y.o. female who is currently on an anti-coagulation regimen.    RECENT RESULTS: Recent results are below, the most recent result is correlated with a dose of 22 mg. per week: Lab Results  Component Value Date   INR 2.1 09/23/2013   INR 2.60 08/26/2013   INR 2.00 07/29/2013    ANTI-COAG DOSE: Anticoagulation Dose Instructions as of 09/23/2013     [name] Tue Wed Thu Fri Sat   New Dose 2 mg 4 mg 4 mg 4 mg 2 mg 4 mg 2 mg       ANTICOAG SUMMARY: Anticoagulation Episode Summary   Current INR goal 2.0-3.0  Next INR check 10/14/2013  INR from last check 2.1 (09/23/2013)  Weekly max dose   Target end date Indefinite  [facility] check location [facility]  Preferred lab   Send INR reminders to    Indications  PULMONARY EMBOLISM HX OF [V12.51] Encounter for long-term (current) use of anticoagulants [V58.61]        Comments 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 The first she describes as "a blood clot in my leg"; the second she describes as "a blood clot in my lung". Only the PE is documented on her problem list as "PE, History of". States she has been on warfarin for "10 years".         ANTICOAG TODAY: Anticoagulation Summary as of 09/23/2013   INR goal 2.0-3.0  Selected INR 2.1 (09/23/2013)  Next INR check 10/14/2013  Target end date Indefinite   Indications  PULMONARY EMBOLISM HX OF [V12.51] Encounter for long-term (current) use of anticoagulants [V58.61]      Anticoagulation Episode Summary   [facility] check location [facility]   Preferred lab    Send INR reminders to    Comments 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 The first she describes as "a blood clot in my leg"; the second she describes as "a blood clot in my lung". Only the PE is documented on her problem list as "PE, History of". States she has been on warfarin for "10 years".       PATIENT INSTRUCTIONS: Patient Instructions  Patient instructed to take  medications as defined in the Anti-coagulation Track section of this encounter.  Patient instructed to take today's dose.  Patient verbalized understanding of these instructions.    FOLLOW-UP No Follow-up on file.  Enzo Bi, PharmD PGY1 Pharmacy Resident

## 2013-10-01 ENCOUNTER — Other Ambulatory Visit: Payer: Self-pay | Admitting: Internal Medicine

## 2013-10-14 ENCOUNTER — Ambulatory Visit (INDEPENDENT_AMBULATORY_CARE_PROVIDER_SITE_OTHER): Payer: PRIVATE HEALTH INSURANCE | Admitting: Pharmacist

## 2013-10-14 ENCOUNTER — Ambulatory Visit (INDEPENDENT_AMBULATORY_CARE_PROVIDER_SITE_OTHER): Payer: PRIVATE HEALTH INSURANCE | Admitting: *Deleted

## 2013-10-14 DIAGNOSIS — Z7901 Long term (current) use of anticoagulants: Secondary | ICD-10-CM

## 2013-10-14 DIAGNOSIS — Z86718 Personal history of other venous thrombosis and embolism: Secondary | ICD-10-CM

## 2013-10-14 DIAGNOSIS — Z23 Encounter for immunization: Secondary | ICD-10-CM

## 2013-10-14 LAB — POCT INR: INR: 3

## 2013-10-14 NOTE — Patient Instructions (Signed)
Patient instructed to take medications as defined in the Anti-coagulation Track section of this encounter.  Patient instructed to take today's dose.  Patient verbalized understanding of these instructions.    

## 2013-10-14 NOTE — Progress Notes (Signed)
Anti-Coagulation Progress Note  Caroline [name] is a 68 y.o. female who is currently on an anti-coagulation regimen.    RECENT RESULTS: Recent results are below, the most recent result is correlated with a dose of 22 mg. per week: Lab Results  Component Value Date   INR 3.0 10/14/2013   INR 2.1 09/23/2013   INR 2.60 08/26/2013    ANTI-COAG DOSE: Anticoagulation Dose Instructions as of 10/14/2013     [name] Tue Wed Thu Fri Sat   New Dose 2 mg 4 mg 2 mg 4 mg 2 mg 4 mg 2 mg       ANTICOAG SUMMARY: Anticoagulation Episode Summary   Current INR goal 2.0-3.0  Next INR check 11/11/2013  INR from last check 3.0 (10/14/2013)  Weekly max dose   Target end date Indefinite  [facility] check location [facility]  Preferred lab   Send INR reminders to    Indications  PULMONARY EMBOLISM HX OF [Z86.718] Encounter for long-term (current) use of anticoagulants [Z79.01]        Comments 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 The first she describes as "a blood clot in my leg"; the second she describes as "a blood clot in my lung". Only the PE is documented on her problem list as "PE, History of". States she has been on warfarin for "10 years".         ANTICOAG TODAY: Anticoagulation Summary as of 10/14/2013   INR goal 2.0-3.0  Selected INR 3.0 (10/14/2013)  Next INR check 11/11/2013  Target end date Indefinite   Indications  PULMONARY EMBOLISM HX OF [Z86.718] Encounter for long-term (current) use of anticoagulants [Z79.01]      Anticoagulation Episode Summary   [facility] check location [facility]   Preferred lab    Send INR reminders to    Comments 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 The first she describes as "a blood clot in my leg"; the second she describes as "a blood clot in my lung". Only the PE is documented on her problem list as "PE, History of". States she has been on warfarin for "10 years".       PATIENT INSTRUCTIONS: Patient Instructions   Patient instructed to take medications as defined in the Anti-coagulation Track section of this encounter.  Patient instructed to take today's dose.  Patient verbalized understanding of these instructions.       FOLLOW-UP Return in 4 weeks (on 11/11/2013) for Follow up INR at 1030h.  Hulen Luster, III Pharm.D., CACP

## 2013-10-24 NOTE — Progress Notes (Signed)
Patient is on anticoagulation for recurrent VTE.  INR 3.0, coumadin decreased.  I have reviewed Dr. Saralyn Pilar note.

## 2013-11-05 ENCOUNTER — Ambulatory Visit (INDEPENDENT_AMBULATORY_CARE_PROVIDER_SITE_OTHER): Payer: PRIVATE HEALTH INSURANCE | Admitting: Internal Medicine

## 2013-11-05 ENCOUNTER — Other Ambulatory Visit: Payer: Self-pay | Admitting: Internal Medicine

## 2013-11-05 ENCOUNTER — Encounter: Payer: Self-pay | Admitting: Internal Medicine

## 2013-11-05 VITALS — BP 152/74 | HR 77 | Temp 98.2°F | Wt 292.5 lb

## 2013-11-05 DIAGNOSIS — I82409 Acute embolism and thrombosis of unspecified deep veins of unspecified lower extremity: Secondary | ICD-10-CM

## 2013-11-05 DIAGNOSIS — Z0000 Encounter for general adult medical examination without abnormal findings: Secondary | ICD-10-CM

## 2013-11-05 DIAGNOSIS — I10 Essential (primary) hypertension: Secondary | ICD-10-CM

## 2013-11-05 DIAGNOSIS — Z87898 Personal history of other specified conditions: Secondary | ICD-10-CM

## 2013-11-05 DIAGNOSIS — Z1231 Encounter for screening mammogram for malignant neoplasm of breast: Secondary | ICD-10-CM

## 2013-11-05 NOTE — Assessment & Plan Note (Signed)
Patient's INR therapeutic at 3.0 on 10/14/13. Patient has good compliance with Coumadin. - Continue Coumadin, will be life-long given hx of 2 events

## 2013-11-05 NOTE — Assessment & Plan Note (Signed)
BP Readings from Last 3 Encounters:  11/05/13 152/74  08/26/13 146/93  05/21/13 130/70    Lab Results  Component Value Date   NA 143 02/19/2013   K 3.7 02/19/2013   CREATININE 0.68 02/19/2013    Assessment: Blood pressure control: mildly elevated Progress toward BP goal:  deteriorated Comments:   Plan: Medications:  continue current medications Educational resources provided:   Self management tools provided:   Other plans: Patient's repeat BP was 130/80. Will continue current regimen of HCTZ 25 mg daily and Metoprolol 25 mg BID.

## 2013-11-05 NOTE — Assessment & Plan Note (Signed)
Patient due for colonoscopy. She refused. States she completed hemoccult cards in June this year, all negative. Patient had mammogram ordered in Feb 2015, but never was scheduled. Patient agreeable to getting mammogram. Will set up appointment.

## 2013-11-05 NOTE — Patient Instructions (Signed)
It was a pleasure taking care of you today, Caroline [name].  1. Mammography- We are scheduling an appointment for you. You will be contacted about your appointment time/date  2. Continue your exercise regimen. I'm happy to hear that you are feeling well!  General Instructions:   Please bring your medicines with you each time you come to clinic.  Medicines may include prescription medications, over-the-counter medications, herbal remedies, eye drops, vitamins, or other pills.   Progress Toward Treatment Goals:  Treatment Goal 08/26/2013  Blood pressure at goal    Self Care Goals & Plans:  Self Care Goal 08/26/2013  Manage my medications take my medicines as prescribed; bring my medications to every visit  Eat healthy foods -  Be physically active find an activity I enjoy    No flowsheet data found.   Care Management & Community Referrals:  Referral 10/30/2012  Referrals made for care management support none needed

## 2013-11-05 NOTE — Assessment & Plan Note (Signed)
Patient denies any further episodes of chest pain. Patient has been making an effort to exercise more by walking daily around [place] or the grocery store. Will consider further work up if chest pain reoccurs.

## 2013-11-05 NOTE — Progress Notes (Signed)
   Subjective:    Patient ID: Caroline [name], female    DOB: 22-Sep-1945, 68 y.o.   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 HTN: BP today 152/74. On repeat, BP was 130/80. Patient takes HCTZ 25 mg daily and Metoprolol 25 mg BID at home.  2. Hx of PE and DVT: Patient is on life long coumadin. Last INR was therapeutic at 3.0 on 10/14/13. She currently denies any lower extremity swelling or tenderness, chest pain, and SOB.  3. Hx of Exertional Chest Pain: Patient had reported chest pain on exertion at her last visit in August. She states she has been walking frequently and has not experienced chest pain for several months.    Review of Systems General: Denies fever, chills, night sweats, changes in weight, changes in appetite HEENT: Denies headaches, ear pain, changes in vision, rhinorrhea, sore throat CV: Denies palpitations, orthopnea Pulm: Denies cough, wheezing GI: Denies abdominal pain, nausea, vomiting, diarrhea, constipation, melena, hematochezia GU: Denies dysuria, hematuria, frequency Msk: Denies muscle cramps, joint pains Neuro: Denies weakness, numbness, tingling Skin: Denies rashes, bruising    Objective:   Physical Exam General: alert, sitting up in chair, NAD HEENT: /AT, EOMI, PERRL, sclera anicteric, pharynx non-erythematous, mucus membranes moist Neck: supple, no JVD, no lymphadenopathy CV: RRR, normal S1/S2, no m/g/r Pulm: CTA bilaterally, breaths non-labored, no wheezing Abd: BS+, soft, non-distended, non-tender Ext: warm, no edema, moves all Neuro: alert and oriented x 3, CNs II-XII intact, strength 5/5 in upper and lower extremities bilaterally      Assessment & Plan:

## 2013-11-06 NOTE — Progress Notes (Signed)
I saw and evaluated the patient.  I personally confirmed the key portions of Dr. Rivet's history and exam and reviewed pertinent patient test results.  The assessment, diagnosis, and plan were formulated together and I agree with the documentation in the resident's note. 

## 2013-11-06 NOTE — Addendum Note (Signed)
Addended by: [name] D on: 11/06/2013 02:26 PM   Modules accepted: Level of Service

## 2013-11-11 ENCOUNTER — Ambulatory Visit: Payer: PRIVATE HEALTH INSURANCE

## 2013-11-18 ENCOUNTER — Ambulatory Visit (INDEPENDENT_AMBULATORY_CARE_PROVIDER_SITE_OTHER): Payer: PRIVATE HEALTH INSURANCE | Admitting: Pharmacist

## 2013-11-18 DIAGNOSIS — Z7901 Long term (current) use of anticoagulants: Secondary | ICD-10-CM

## 2013-11-18 DIAGNOSIS — Z86718 Personal history of other venous thrombosis and embolism: Secondary | ICD-10-CM

## 2013-11-18 LAB — POCT INR: INR: 2.1

## 2013-11-18 MED ORDER — WARFARIN SODIUM 4 MG PO TABS: ORAL_TABLET | ORAL | Status: DC

## 2013-11-18 NOTE — Patient Instructions (Signed)
Patient instructed to take medications as defined in the Anti-coagulation Track section of this encounter.  Patient instructed to take today's dose.  Patient verbalized understanding of these instructions.    

## 2013-11-18 NOTE — Progress Notes (Signed)
Anti-Coagulation Progress Note  Caroline [name] is a 68 y.o. female who is currently on an anti-coagulation regimen.    RECENT RESULTS: Recent results are below, the most recent result is correlated with a dose of 20 mg. per week: Lab Results  Component Value Date   INR 2.10 11/18/2013   INR 3.0 10/14/2013   INR 2.1 09/23/2013    ANTI-COAG DOSE: Anticoagulation Dose Instructions as of 11/18/2013      [name] Tue Wed Thu Fri Sat   New Dose 4 mg 4 mg 2 mg 4 mg 2 mg 4 mg 2 mg       ANTICOAG SUMMARY: Anticoagulation Episode Summary    Current INR goal 2.0-3.0  Next INR check 12/23/2013  INR from last check 2.10 (11/18/2013)  Weekly max dose   Target end date Indefinite  [facility] check location [facility]  Preferred lab   Send INR reminders to    Indications  PULMONARY EMBOLISM HX OF [Z86.718] Encounter for long-term (current) use of anticoagulants [Z79.01]        Comments 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 The first she describes as "a blood clot in my leg"; the second she describes as "a blood clot in my lung". Only the PE is documented on her problem list as "PE, History of". States she has been on warfarin for "10 years".         ANTICOAG TODAY: Anticoagulation Summary as of 11/18/2013    INR goal 2.0-3.0  Selected INR 2.10 (11/18/2013)  Next INR check 12/23/2013  Target end date Indefinite   Indications  PULMONARY EMBOLISM HX OF [Z86.718] Encounter for long-term (current) use of anticoagulants [Z79.01]      Anticoagulation Episode Summary    [facility] check location [facility]   Preferred lab    Send INR reminders to    Comments 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 The first she describes as "a blood clot in my leg"; the second she describes as "a blood clot in my lung". Only the PE is documented on her problem list as "PE, History of". States she has been on warfarin for "10 years".       PATIENT INSTRUCTIONS: Patient  Instructions  Patient instructed to take medications as defined in the Anti-coagulation Track section of this encounter.  Patient instructed to take today's dose.  Patient verbalized understanding of these instructions.       FOLLOW-UP Return in about 5 weeks (around 12/23/2013) for Follow up INR at 1030h.  Hulen Luster, III Pharm.D., CACP

## 2013-11-26 ENCOUNTER — Ambulatory Visit (HOSPITAL_COMMUNITY)
Admission: RE | Admit: 2013-11-26 | Discharge: 2013-11-26 | Disposition: A | Discharge status: Home / Self Care | Payer: PRIVATE HEALTH INSURANCE | Source: Ambulatory Visit | Attending: Internal Medicine | Admitting: Internal Medicine

## 2013-11-26 ENCOUNTER — Other Ambulatory Visit: Payer: Self-pay | Admitting: Internal Medicine

## 2013-11-26 DIAGNOSIS — Z1231 Encounter for screening mammogram for malignant neoplasm of breast: Secondary | ICD-10-CM | POA: Diagnosis not present

## 2013-11-28 ENCOUNTER — Other Ambulatory Visit: Payer: Self-pay | Admitting: Internal Medicine

## 2013-12-02 ENCOUNTER — Other Ambulatory Visit: Payer: Self-pay | Admitting: Internal Medicine

## 2013-12-03 NOTE — Telephone Encounter (Signed)
Filled 11/19. Needs 04/2014 appt PCP

## 2013-12-23 ENCOUNTER — Ambulatory Visit (INDEPENDENT_AMBULATORY_CARE_PROVIDER_SITE_OTHER): Payer: PRIVATE HEALTH INSURANCE | Admitting: Pharmacist

## 2013-12-23 DIAGNOSIS — Z86718 Personal history of other venous thrombosis and embolism: Secondary | ICD-10-CM

## 2013-12-23 DIAGNOSIS — Z7901 Long term (current) use of anticoagulants: Secondary | ICD-10-CM

## 2013-12-23 LAB — POCT INR
INR: 3.5
INR: 3.5

## 2013-12-23 NOTE — Patient Instructions (Signed)
Patient instructed to take medications as defined in the Anti-coagulation Track section of this encounter.  Patient instructed to take today's dose.  Patient verbalized understanding of these instructions.    

## 2013-12-23 NOTE — Progress Notes (Signed)
Anti-Coagulation Progress Note  Caroline [name] is a 68 y.o. female who is currently on an anti-coagulation regimen.    RECENT RESULTS: Recent results are below, the most recent result is correlated with a dose of 22 mg. per week: Lab Results  Component Value Date   INR 3.50 12/23/2013   INR 3.50 12/23/2013   INR 2.10 11/18/2013    ANTI-COAG DOSE: Anticoagulation Dose Instructions as of 12/23/2013      [name] Tue Wed Thu Fri Sat   New Dose 2 mg 4 mg 2 mg 4 mg 2 mg 4 mg 2 mg       ANTICOAG SUMMARY: Anticoagulation Episode Summary    Current INR goal 2.0-3.0  Next INR check 01/20/2014  INR from last check 3.50! (12/23/2013)  Weekly max dose   Target end date Indefinite  [facility] check location [facility]  Preferred lab   Send INR reminders to    Indications  PULMONARY EMBOLISM HX OF [Z86.718] Encounter for long-term (current) use of anticoagulants [Z79.01]        Comments 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 The first she describes as "a blood clot in my leg"; the second she describes as "a blood clot in my lung". Only the PE is documented on her problem list as "PE, History of". States she has been on warfarin for "10 years".         ANTICOAG TODAY: Anticoagulation Summary as of 12/23/2013    INR goal 2.0-3.0  Selected INR 3.50! (12/23/2013)  Next INR check 01/20/2014  Target end date Indefinite   Indications  PULMONARY EMBOLISM HX OF [Z86.718] Encounter for long-term (current) use of anticoagulants [Z79.01]      Anticoagulation Episode Summary    [facility] check location [facility]   Preferred lab    Send INR reminders to    Comments 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 The first she describes as "a blood clot in my leg"; the second she describes as "a blood clot in my lung". Only the PE is documented on her problem list as "PE, History of". States she has been on warfarin for "10 years".       PATIENT  INSTRUCTIONS: Patient Instructions  Patient instructed to take medications as defined in the Anti-coagulation Track section of this encounter.  Patient instructed to take today's dose.  Patient verbalized understanding of these instructions.       FOLLOW-UP Return in 4 weeks (on 01/20/2014) for Follow up INR at 1015h.  Hulen Luster, III Pharm.D., CACP

## 2013-12-24 NOTE — Progress Notes (Signed)
Ms. Muzzey in on anticoagulation for PE.  Her INR was elevated and her coumadin decreased.  I have reviewed Dr. Saralyn Pilar note.

## 2014-01-01 ENCOUNTER — Other Ambulatory Visit: Payer: Self-pay | Admitting: Internal Medicine

## 2014-01-01 DIAGNOSIS — I10 Essential (primary) hypertension: Secondary | ICD-10-CM

## 2014-01-01 DIAGNOSIS — J4530 Mild persistent asthma, uncomplicated: Secondary | ICD-10-CM

## 2014-01-04 ENCOUNTER — Other Ambulatory Visit: Payer: Self-pay | Admitting: Internal Medicine

## 2014-01-20 ENCOUNTER — Ambulatory Visit (INDEPENDENT_AMBULATORY_CARE_PROVIDER_SITE_OTHER): Payer: Medicare Other | Admitting: Pharmacist

## 2014-01-20 DIAGNOSIS — Z86718 Personal history of other venous thrombosis and embolism: Secondary | ICD-10-CM

## 2014-01-20 DIAGNOSIS — Z7901 Long term (current) use of anticoagulants: Secondary | ICD-10-CM

## 2014-01-20 LAB — POCT INR: INR: 2.3

## 2014-01-20 NOTE — Progress Notes (Signed)
Anti-Coagulation Progress Note  Caroline [name] is a 69 y.o. female who is currently on an anti-coagulation regimen.    RECENT RESULTS: Recent results are below, the most recent result is correlated with a dose of 20 mg. per week: Lab Results  Component Value Date   INR 2.30 01/20/2014   INR 3.50 12/23/2013   INR 3.50 12/23/2013    ANTI-COAG DOSE: Anticoagulation Dose Instructions as of 01/20/2014      [name] Tue Wed Thu Fri Sat   New Dose 2 mg 4 mg 2 mg 4 mg 2 mg 4 mg 2 mg       ANTICOAG SUMMARY: Anticoagulation Episode Summary    Current INR goal 2.0-3.0  Next INR check 02/17/2014  INR from last check 2.30 (01/20/2014)  Weekly max dose   Target end date Indefinite  [facility] check location [facility]  Preferred lab   Send INR reminders to    Indications  PULMONARY EMBOLISM HX OF [Z86.718] Encounter for long-term (current) use of anticoagulants [Z79.01]        Comments 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 The first she describes as "a blood clot in my leg"; the second she describes as "a blood clot in my lung". Only the PE is documented on her problem list as "PE, History of". States she has been on warfarin for "10 years".         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 The first she describes as "a blood clot in my leg"; the second she describes as "a blood clot in my lung". Only the PE is documented on her problem list as "PE, History of". States she has been on warfarin for "10 years".       PATIENT INSTRUCTIONS: Patient  Instructions  Patient instructed to take medications as defined in the Anti-coagulation Track section of this encounter.  Patient instructed to take today's dose.  Patient verbalized understanding of these instructions.       FOLLOW-UP Return in 4 weeks (on 02/17/2014) for Follow up INR at 1015h.  Hulen Luster, III Pharm.D., CACP

## 2014-01-20 NOTE — Progress Notes (Signed)
Indication: Recurrent thromboembolism.  Duration: Lifelong.  INR: At target.  Agree with Dr. Groce's assessment and plan. 

## 2014-01-20 NOTE — Patient Instructions (Signed)
Patient instructed to take medications as defined in the Anti-coagulation Track section of this encounter.  Patient instructed to take today's dose.  Patient verbalized understanding of these instructions.    

## 2014-02-11 ENCOUNTER — Other Ambulatory Visit: Payer: Self-pay | Admitting: Internal Medicine

## 2014-02-14 ENCOUNTER — Telehealth: Payer: Self-pay | Admitting: Pharmacist

## 2014-02-14 NOTE — Telephone Encounter (Signed)
Call to patient to confirm appointment for 02/17/14 at 10:30. Pt vm not set up unable to leave message

## 2014-02-17 ENCOUNTER — Ambulatory Visit (INDEPENDENT_AMBULATORY_CARE_PROVIDER_SITE_OTHER): Payer: Medicare Other | Admitting: Pharmacist

## 2014-02-17 DIAGNOSIS — Z86718 Personal history of other venous thrombosis and embolism: Secondary | ICD-10-CM

## 2014-02-17 DIAGNOSIS — Z7901 Long term (current) use of anticoagulants: Secondary | ICD-10-CM | POA: Diagnosis not present

## 2014-02-17 LAB — POCT INR: INR: 2

## 2014-02-20 NOTE — Progress Notes (Signed)
Anti-Coagulation Progress Note  Caroline [name] is a 69 y.o. female who is currently on an anti-coagulation regimen.    RECENT RESULTS: Recent results are below, the most recent result is correlated with a dose of 20 mg. per week: Lab Results  Component Value Date   INR 2.00 02/17/2014   INR 2.30 01/20/2014   INR 3.50 12/23/2013    ANTI-COAG DOSE: Anticoagulation Dose Instructions as of 02/17/2014      [name] Tue Wed Thu Fri Sat   New Dose 4 mg 4 mg 2 mg 4 mg 2 mg 4 mg 2 mg       ANTICOAG SUMMARY: Anticoagulation Episode Summary    Current INR goal 2.0-3.0  Next INR check 03/17/2014  INR from last check 2.00 (02/17/2014)  Weekly max dose   Target end date Indefinite  [facility]R check location [facility]  Preferred lab   Send INR reminders to    Indications  PULMONARY EMBOLISM HX OF [Z86.718] Encounter for long-term (current) use of anticoagulants [Z79.01]        Comments 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 The first she describes as "a blood clot in my leg"; the second she describes as "a blood clot in my lung". Only the PE is documented on her problem list as "PE, History of". States she has been on warfarin for "10 years".         ANTICOAG TODAY: Anticoagulation Summary as of 02/17/2014    INR goal 2.0-3.0  Selected INR 2.00 (02/17/2014)  Next INR check 03/17/2014  Target end date Indefinite   Indications  PULMONARY EMBOLISM HX OF [Z86.718] Encounter for long-term (current) use of anticoagulants [Z79.01]      Anticoagulation Episode Summary    [facility] check location [facility]   Preferred lab    Send INR reminders to    Comments 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 The first she describes as "a blood clot in my leg"; the second she describes as "a blood clot in my lung". Only the PE is documented on her problem list as "PE, History of". States she has been on warfarin for "10 years".       PATIENT INSTRUCTIONS: Patient  Instructions  Patient instructed to take medications as defined in the Anti-coagulation Track section of this encounter.  Patient instructed to take today's dose.  Patient verbalized understanding of these instructions.       FOLLOW-UP Return in 4 weeks (on 03/17/2014) for Follow up INR at 1030h.  Hulen Luster, III Pharm.D., CACP

## 2014-02-20 NOTE — Patient Instructions (Signed)
Patient instructed to take medications as defined in the Anti-coagulation Track section of this encounter.  Patient instructed to take today's dose.  Patient verbalized understanding of these instructions.    

## 2014-02-21 NOTE — Progress Notes (Signed)
INTERNAL MEDICINE TEACHING ATTENDING ADDENDUM - Emmit Oriley M.D  Duration- indefinite, Indication- PE, INR- therapeutic. Agree with Dr. Groce's recommendations as outlined in his note.      

## 2014-02-27 ENCOUNTER — Other Ambulatory Visit: Payer: Self-pay | Admitting: Internal Medicine

## 2014-03-05 ENCOUNTER — Other Ambulatory Visit: Payer: Self-pay | Admitting: Internal Medicine

## 2014-03-05 NOTE — Telephone Encounter (Signed)
I just refilled this prescription on 02/27/14

## 2014-03-11 ENCOUNTER — Other Ambulatory Visit: Payer: Self-pay | Admitting: Internal Medicine

## 2014-03-13 ENCOUNTER — Telehealth: Payer: Self-pay | Admitting: Pharmacist

## 2014-03-13 NOTE — Telephone Encounter (Signed)
All to patient to confirm appointment for 03/17/14 at 10:30 phone number is disconnected

## 2014-03-17 ENCOUNTER — Ambulatory Visit (INDEPENDENT_AMBULATORY_CARE_PROVIDER_SITE_OTHER): Payer: Medicare Other | Admitting: Pharmacist

## 2014-03-17 DIAGNOSIS — Z86718 Personal history of other venous thrombosis and embolism: Secondary | ICD-10-CM | POA: Diagnosis not present

## 2014-03-17 LAB — POCT INR: INR: 2.7

## 2014-03-17 MED ORDER — WARFARIN SODIUM 4 MG PO TABS: ORAL_TABLET | ORAL | Status: DC

## 2014-03-17 NOTE — Progress Notes (Signed)
CLINICAL PHARMACY ANTICOAGULATION MANAGEMENT Caroline [name] is a 68 y.o. female who reports to the clinic for monitoring of warfarin treatment.    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 The first she describes as "a blood clot in my leg"; the second she describes as "a blood clot in my lung". Only the PE is documented on her problem list as "PE, History of". States she has been on warfarin for "10 years".        ASSESSMENT Recent Results: Recent results are below, the most recent result is correlated with a dose of 22 mg per week: Lab Results  Component Value Date   INR 2.7 03/17/2014   INR 2.00 02/17/2014   INR 2.30 01/20/2014    INR today: Therapeutic Findings: otherwise negative  Anticoagulation Dosing: INR as of 03/17/2014 and Previous Dosing Information    INR Dt INR Goal [facility] Sun Mon Tue Wed Thu Fri Sat   03/17/2014 2.7 2.0-3.0 22 mg 4 mg 4 mg 2 mg 4 mg 2 mg 4 mg 2 mg    Anticoagulation Dose Instructions as of 03/17/2014      Total Sun Mon Tue Wed Thu Fri Sat   New Dose 22 mg 4 mg 4 mg 2 mg 4 mg 2 mg 4 mg 2 mg     (4 mg x 1)  (4 mg x 1)  (4 mg x 0.5)  (4 mg x 1)  (4 mg x 0.5)  (4 mg x 1)  (4 mg x 0.5)                           PLAN Weekly dose was unchanged.  Patient Instructions: Patient Instructions  Patient educated about medication as defined in this encounter and verbalized understanding by repeating back instructions provided.     Follow-up Return in about 4 weeks (around  04/14/2014) for Follow up INR on 04/14/14 at 10.  Marzetta Board, PharmD BCPS, BCACP

## 2014-03-17 NOTE — Patient Instructions (Signed)
Patient educated about medication as defined in this encounter and verbalized understanding by repeating back instructions provided.   

## 2014-03-25 ENCOUNTER — Other Ambulatory Visit: Payer: Self-pay | Admitting: Internal Medicine

## 2014-04-03 ENCOUNTER — Telehealth: Payer: Self-pay | Admitting: Internal Medicine

## 2014-04-03 NOTE — Telephone Encounter (Signed)
Call to patient to confirm appointment for 04/08/14 at 1:15 phone number is disconnected

## 2014-04-08 ENCOUNTER — Other Ambulatory Visit: Payer: Self-pay | Admitting: Internal Medicine

## 2014-04-08 ENCOUNTER — Encounter: Payer: Self-pay | Admitting: Internal Medicine

## 2014-04-08 ENCOUNTER — Ambulatory Visit (INDEPENDENT_AMBULATORY_CARE_PROVIDER_SITE_OTHER): Payer: Medicare Other | Admitting: Internal Medicine

## 2014-04-08 VITALS — BP 132/52 | HR 80 | Temp 98.2°F | Ht 62.0 in | Wt 284.4 lb

## 2014-04-08 DIAGNOSIS — Z87898 Personal history of other specified conditions: Secondary | ICD-10-CM

## 2014-04-08 DIAGNOSIS — I82409 Acute embolism and thrombosis of unspecified deep veins of unspecified lower extremity: Secondary | ICD-10-CM | POA: Diagnosis not present

## 2014-04-08 DIAGNOSIS — J4530 Mild persistent asthma, uncomplicated: Secondary | ICD-10-CM

## 2014-04-08 DIAGNOSIS — E785 Hyperlipidemia, unspecified: Secondary | ICD-10-CM | POA: Diagnosis not present

## 2014-04-08 DIAGNOSIS — I10 Essential (primary) hypertension: Secondary | ICD-10-CM | POA: Diagnosis not present

## 2014-04-08 DIAGNOSIS — Z0000 Encounter for general adult medical examination without abnormal findings: Secondary | ICD-10-CM

## 2014-04-08 DIAGNOSIS — R42 Dizziness and giddiness: Secondary | ICD-10-CM

## 2014-04-08 LAB — BASIC METABOLIC PANEL WITH GFR
BUN: 14 mg/dL (ref 6–23)
CALCIUM: 9.6 mg/dL (ref 8.4–10.5)
CO2: 30 mEq/L (ref 19–32)
CREATININE: 0.73 mg/dL (ref 0.50–1.10)
Chloride: 103 mEq/L (ref 96–112)
GFR, EST NON AFRICAN AMERICAN: 85 mL/min
GFR, Est African American: >89 mL/min
Glucose, Bld: 116 mg/dL — ABNORMAL HIGH (ref 70–99)
POTASSIUM: 3.6 meq/L (ref 3.5–5.3)
Sodium: 143 mEq/L (ref 135–145)

## 2014-04-08 LAB — LIPID PANEL
CHOL/HDL RATIO: 4.9 ratio
Cholesterol: 194 mg/dL (ref 0–200)
HDL: 40 mg/dL — ABNORMAL LOW (ref >=46)
LDL CALC: 110 mg/dL — AB (ref 0–99)
Triglycerides: 219 mg/dL — ABNORMAL HIGH (ref <150)
VLDL: 44 mg/dL — AB (ref 0–40)

## 2014-04-08 NOTE — Assessment & Plan Note (Addendum)
Mammogram negative in Nov 2015.  Patient commended for increasing her exercise (daily walking) and healthier eating. As a result she has lost 8 lbs. She was encouraged to continue these lifestyle changes.

## 2014-04-08 NOTE — Assessment & Plan Note (Signed)
Patient states vertigo occurs only occasionally. Well controlled with Meclizine. - Continue Meclizine PRN

## 2014-04-08 NOTE — Assessment & Plan Note (Signed)
BP Readings from Last 3 Encounters:  04/08/14 132/52  11/05/13 152/74  08/26/13 146/93    Lab Results  Component Value Date   NA 143 02/19/2013   K 3.7 02/19/2013   CREATININE 0.68 02/19/2013    Assessment: Blood pressure control: controlled Progress toward BP goal:  at goal  Plan: Medications:  continue current medications   BP well controlled. Continue HCTZ 25 mg daily and Metoprolol 25 mg BID.

## 2014-04-08 NOTE — Assessment & Plan Note (Signed)
-   Check lipid panel today - Continue Pravastatin 80 mg daily

## 2014-04-08 NOTE — Patient Instructions (Signed)
It was a pleasure seeing you today, Ms. Rosal.  1. High blood pressure - Keep up the great work! Blood pressure was great today. Continue walking and eating healthy.  - Continue taking Metoprolol and HCTZ  2. High cholesterol - Blood work today - Continue pravastatin 80 mg daily  3. History of blood clot in leg - Continue warfarin - Last INR 2.7- great!  General Instructions:   Please bring your medicines with you each time you come to clinic.  Medicines may include prescription medications, over-the-counter medications, herbal remedies, eye drops, vitamins, or other pills.   Progress Toward Treatment Goals:  Treatment Goal 11/05/2013  Blood pressure deteriorated    Self Care Goals & Plans:  Self Care Goal 11/05/2013  Manage my medications take my medicines as prescribed  Eat healthy foods -  Be physically active take a walk every day  Meeting treatment goals maintain the current self-care plan    No flowsheet data found.   Care Management & Community Referrals:  Referral 10/30/2012  Referrals made for care management support none needed

## 2014-04-08 NOTE — Assessment & Plan Note (Signed)
Patient denies any further episodes of chest pain. She has increased her walking regimen and has not had issues with chest pain, dyspnea, or palpitations. She was congratulated on losing 8 lbs.

## 2014-04-08 NOTE — Assessment & Plan Note (Signed)
Last INR 2.7 on 03/17/14. 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

## 2014-04-08 NOTE — Progress Notes (Signed)
   Subjective:    Patient ID: Caroline [name], female    DOB: 1945/09/29, 69 y.o.   MRN: [id number]  HPI Caroline [name] is a 69yo woman with PMHx of HTN, hx of DVT and PE on warfarin, asthma, and hyperlipidemia who presents today for the following:  1. HTN: BP today 132/52. She takes HCTZ 25 mg daily and Metoprolol 25 mg BID at home.   2. Hx DVT/PE: Patient is on warfarin long-term. Last INR 2.7 on 03/17/14.   3. Asthma: Patient denies any asthma exacerbations or wheezing. She uses an albuterol inhaler PRN, atrovent daily, singulair 10 mg QHS, and symbicort BID at home.   4. HLD: Last lipid profile on 11/20/12 shows Chol 189, Trigly 154, HDL 43, and LDL 115. She takes Pravastatin 80 mg daily at home. Denies any muscle cramps.   5. Vertigo: Patient reports occasional episodes of vertigo. She states the Meclizine helps her symptoms. She denies any falls.   6. Hx Exertional Chest Pain: Patient denies any chest pain. She reports dyspnea occasionally, but states she has been walking a significant amount lately without any trouble. She also notes an 8 lb weight loss and reports she has been eating healthier by cutting out junk foods. She denies leg swelling, palpitations, orthopnea.    Review of Systems General: Denies fever, chills, night sweats,  changes in appetite HEENT: Denies headaches, ear pain, changes in vision, rhinorrhea, sore throat CV: See above  Pulm: See above  GI: Denies abdominal pain, nausea, vomiting, diarrhea, constipation, melena, hematochezia GU: Denies dysuria, hematuria, frequency Msk: Denies joint pains Neuro: Denies weakness, numbness, tingling Skin: Denies rashes, bruising    Objective:   Physical Exam General: alert, sitting up in chair, NAD HEENT: [place]/AT, EOMI, sclera anicteric, mucus membranes moist Neck: supple, no JVD present CV: RRR, no m/g/r Pulm: CTA bilaterally, breaths non-labored Abd: BS+, soft, obese, non-tender Ext: warm, no edema, moves all Neuro: alert  and oriented x 3, no focal deficits      Assessment & Plan:

## 2014-04-08 NOTE — Assessment & Plan Note (Addendum)
Patient on 4 inhalers at home: albuterol PRN, atrovent, monteleukast, and symbicort. No PFTs on file. Appears that she was started on atrovent in Feb 2015 for an acute asthma exacerbation and was also placed on symbicort sometime around then as well. Unclear why she is on all 4 inhalers still.  - Will discuss discontinuing atrovent with patient.  - Discuss getting PFTs to establish diagnosis and determine if COPD playing a role

## 2014-04-10 NOTE — Progress Notes (Signed)
Internal Medicine Clinic Attending  Case discussed with Dr. Rivet soon after the resident saw the patient.  We reviewed the resident's history and exam and pertinent patient test results.  I agree with the assessment, diagnosis, and plan of care documented in the resident's note.  

## 2014-04-11 ENCOUNTER — Telehealth: Payer: Self-pay | Admitting: Pharmacist

## 2014-04-11 ENCOUNTER — Telehealth: Payer: Self-pay | Admitting: Internal Medicine

## 2014-04-11 NOTE — Telephone Encounter (Signed)
Call to patient to confirm appointment for 04/14/14 at 10:00 lmtcb.

## 2014-04-11 NOTE — Telephone Encounter (Signed)
Discussed lab results with patient. No medication changes made.

## 2014-04-14 ENCOUNTER — Ambulatory Visit (INDEPENDENT_AMBULATORY_CARE_PROVIDER_SITE_OTHER): Payer: Medicare Other | Admitting: Pharmacist

## 2014-04-14 DIAGNOSIS — Z7901 Long term (current) use of anticoagulants: Secondary | ICD-10-CM | POA: Diagnosis not present

## 2014-04-14 DIAGNOSIS — Z86718 Personal history of other venous thrombosis and embolism: Secondary | ICD-10-CM

## 2014-04-14 LAB — POCT INR: INR: 2.3

## 2014-04-14 NOTE — Patient Instructions (Signed)
Patient instructed to take medications as defined in the Anti-coagulation Track section of this encounter.  Patient instructed to take today's dose.  Patient verbalized understanding of these instructions.    

## 2014-04-14 NOTE — Progress Notes (Signed)
Anti-Coagulation Progress Note  Caroline [name] is a 69 y.o. female who is currently on an anti-coagulation regimen.    RECENT RESULTS: Recent results are below, the most recent result is correlated with a dose of 22 mg. 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 The first she describes as "a blood clot in my leg"; the second she describes as "a blood clot in my lung". Only the PE is documented on her problem list as "PE, History of". States she has been on warfarin for "10 years".         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 The first she describes as "a blood clot in my leg"; the second she describes as "a blood clot in my lung". Only the PE is documented on her problem list as "PE, History of". States she has been on warfarin for "10 years".       PATIENT INSTRUCTIONS: Patient  Instructions  Patient instructed to take medications as defined in the Anti-coagulation Track section of this encounter.  Patient instructed to take today's dose.  Patient verbalized understanding of these instructions.       FOLLOW-UP Return in 4 weeks (on 05/12/2014) for Follow up INR at 1000h.  Hulen Luster, III Pharm.D., CACP

## 2014-04-15 NOTE — Progress Notes (Signed)
I have reviewed Dr. Saralyn Pilar note.  Caroline [name] is on anticoagulation for recurrent VTE.

## 2014-04-24 ENCOUNTER — Telehealth: Payer: Self-pay | Admitting: Internal Medicine

## 2014-04-24 ENCOUNTER — Other Ambulatory Visit: Payer: Self-pay | Admitting: Internal Medicine

## 2014-04-24 DIAGNOSIS — J4530 Mild persistent asthma, uncomplicated: Secondary | ICD-10-CM

## 2014-04-24 NOTE — Telephone Encounter (Signed)
Discussed going off of Atrovent with patient since this is not typically used for asthma treatment. Patient is agreeable. Instructed to continue Singulair, Atrovent PRN, and Symbicort. We will work on stopping Symbicort as well. I have placed order for PFTs since there are none in the system.

## 2014-04-27 ENCOUNTER — Other Ambulatory Visit: Payer: Self-pay | Admitting: Internal Medicine

## 2014-04-29 ENCOUNTER — Telehealth: Payer: Self-pay | Admitting: Internal Medicine

## 2014-04-29 NOTE — Telephone Encounter (Signed)
I received a call on the on call pager for Dr. Beckie Salts or Dr. Rogelia Boga in regards to PFTs ordered for Caroline [name]. They are requesting clarification on the order of PFTs whether Dr. Beckie Salts would like full PFTs or methacholine challenge because both were checked and they can only do one. I informed them that I would relay this message to Dr. Beckie Salts and she can touch base with them on the number listed below to clarify and so that the test can be scheduled.   Babs Sciara, PFT lab [phone]  They said if no one answers, please leave a message.   Thank you

## 2014-04-29 NOTE — Telephone Encounter (Signed)
Thanks, Samaya. I called them back with order for full PFTs.

## 2014-04-30 ENCOUNTER — Other Ambulatory Visit: Payer: Self-pay | Admitting: Internal Medicine

## 2014-05-07 ENCOUNTER — Other Ambulatory Visit: Payer: Self-pay | Admitting: Internal Medicine

## 2014-05-12 ENCOUNTER — Ambulatory Visit (INDEPENDENT_AMBULATORY_CARE_PROVIDER_SITE_OTHER): Payer: Medicare Other | Admitting: Pharmacist

## 2014-05-12 DIAGNOSIS — Z7901 Long term (current) use of anticoagulants: Secondary | ICD-10-CM | POA: Diagnosis not present

## 2014-05-12 DIAGNOSIS — Z86718 Personal history of other venous thrombosis and embolism: Secondary | ICD-10-CM

## 2014-05-12 LAB — POCT INR: INR: 2.1

## 2014-05-12 NOTE — Progress Notes (Signed)
Anti-Coagulation Progress Note  Caroline [name] is a 69 y.o. female who is currently on an anti-coagulation regimen.    RECENT RESULTS: Recent results are below, the most recent result is correlated with a dose of 24 mg. 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 The first she describes as "a blood clot in my leg"; the second she describes as "a blood clot in my lung". Only the PE is documented on her problem list as "PE, History of". States she has been on warfarin for "10 years".         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 The first she describes as "a blood clot in my leg"; the second she describes as "a blood clot in my lung". Only the PE is documented on her problem list as "PE, History of". States she has been on warfarin for "10 years".       PATIENT INSTRUCTIONS: Patient  Instructions  Patient instructed to take medications as defined in the Anti-coagulation Track section of this encounter.  Patient instructed to take today's dose.  Patient verbalized understanding of these instructions.       FOLLOW-UP Return in 3 weeks (on 06/02/2014) for Follow up INR at 1015h.  Hulen Luster, III Pharm.D., CACP

## 2014-05-12 NOTE — Progress Notes (Signed)
INTERNAL MEDICINE TEACHING ATTENDING ADDENDUM - Isabella Ida M.D  Duration- indefinite, Indication- PE, INR- therapeutic. Agree with Dr. Groce's recommendations as outlined in his note.      

## 2014-05-12 NOTE — Patient Instructions (Signed)
Patient instructed to take medications as defined in the Anti-coagulation Track section of this encounter.  Patient instructed to take today's dose.  Patient verbalized understanding of these instructions.    

## 2014-05-19 ENCOUNTER — Other Ambulatory Visit: Payer: Self-pay | Admitting: Internal Medicine

## 2014-05-19 ENCOUNTER — Ambulatory Visit (HOSPITAL_COMMUNITY)
Admission: RE | Admit: 2014-05-19 | Discharge: 2014-05-19 | Disposition: A | Discharge status: Home / Self Care | Payer: Medicare Other | Source: Ambulatory Visit | Attending: Internal Medicine | Admitting: Internal Medicine

## 2014-05-19 DIAGNOSIS — J4530 Mild persistent asthma, uncomplicated: Secondary | ICD-10-CM | POA: Insufficient documentation

## 2014-05-19 LAB — PULMONARY FUNCTION TEST
DL/VA % pred: 86 %
DL/VA: 4.03 ml/min/mmHg/L
DLCO UNC: 11.08 ml/min/mmHg
DLCO unc % pred: 48 %
FEF 25-75 POST: 1.64 L/s
FEF 25-75 Pre: 1.37 L/sec
FEF2575-%Change-Post: 19 %
FEF2575-%PRED-PRE: 78 %
FEF2575-%Pred-Post: 94 %
FEV1-%CHANGE-POST: 3 %
FEV1-%Pred-Post: 94 %
FEV1-%Pred-Pre: 90 %
FEV1-PRE: 1.65 L
FEV1-Post: 1.71 L
FEV1FVC-%Change-Post: 2 %
FEV1FVC-%Pred-Pre: 98 %
FEV6-%Change-Post: 2 %
FEV6-%PRED-POST: 96 %
FEV6-%Pred-Pre: 94 %
FEV6-PRE: 2.12 L
FEV6-Post: 2.17 L
FEV6FVC-%Change-Post: 0 %
FEV6FVC-%PRED-PRE: 104 %
FEV6FVC-%Pred-Post: 103 %
FVC-%Change-Post: 1 %
FVC-%Pred-Post: 93 %
FVC-%Pred-Pre: 91 %
FVC-POST: 2.18 L
FVC-Pre: 2.15 L
POST FEV6/FVC RATIO: 100 %
Post FEV1/FVC ratio: 79 %
Pre FEV1/FVC ratio: 77 %
Pre FEV6/FVC Ratio: 100 %

## 2014-05-19 MED ORDER — ALBUTEROL SULFATE (2.5 MG/3ML) 0.083% IN NEBU
2.5000 mg | INHALATION_SOLUTION | Freq: Once | RESPIRATORY_TRACT | Status: AC
  Administered 2014-05-19: 2.5 mg via RESPIRATORY_TRACT

## 2014-05-29 ENCOUNTER — Telehealth: Payer: Self-pay | Admitting: Pharmacist

## 2014-05-29 NOTE — Telephone Encounter (Signed)
Call to patient to confirm appointment for 06/02/14 at 10:30 lmtcb ° °

## 2014-06-02 ENCOUNTER — Ambulatory Visit (INDEPENDENT_AMBULATORY_CARE_PROVIDER_SITE_OTHER): Payer: Medicare Other | Admitting: Pharmacist

## 2014-06-02 DIAGNOSIS — Z86718 Personal history of other venous thrombosis and embolism: Secondary | ICD-10-CM | POA: Diagnosis not present

## 2014-06-02 DIAGNOSIS — Z7901 Long term (current) use of anticoagulants: Secondary | ICD-10-CM | POA: Diagnosis not present

## 2014-06-02 LAB — POCT INR: INR: 4.5

## 2014-06-02 MED ORDER — WARFARIN SODIUM 4 MG PO TABS: ORAL_TABLET | ORAL | Status: DC

## 2014-06-02 NOTE — Progress Notes (Signed)
CLINICAL PHARMACIST ANTICOAG NOTE Caroline [name] is a 69 y.o. female who reports to the clinic for monitoring of warfarin treatment.    Indication: History of pulmonary embolism Duration: indefinite  Anticoagulation Clinic Visit History: Anticoagulation Episode Summary    Current INR goal 2.0-3.0  Next INR check 06/10/2014  INR from last check 4.5! 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 The first she describes as "a blood clot in my leg"; the second she describes as "a blood clot in my lung". Only the PE is documented on her problem list as "PE, History of". States she has been on warfarin for "10 years".        ASSESSMENT Recent Results: Recent results are below, the most recent result is correlated with a dose of 28 mg per week: Lab Results  Component Value Date   INR 4.5 06/02/2014   INR 2.10 05/12/2014   INR 2.30 04/14/2014    INR today: Supratherapeutic  Anticoagulation Dosing: INR as of 06/02/2014 and Previous Dosing Information    INR Dt INR Goal [facility] Sun Mon Tue Wed Thu Fri Sat   06/02/2014 4.5 2.0-3.0 28 mg 4 mg 4 mg 4 mg 4 mg 4 mg 4 mg 4 mg    Anticoagulation Dose Instructions as of 06/02/2014      Total Sun Mon Tue Wed Thu Fri Sat   New Dose 18 mg 4 mg Hold 2 mg 4 mg 2 mg 4 mg 2 mg     (4 mg x 1)  -  (4 mg x 0.5)  (4 mg x 1)  (4 mg x 0.5)  (4 mg x 1)  (4 mg x 0.5)                         Description        Targeting weekly dose of 22 mg. Patient instructed to take full tablet (4 mg) on Monday 06/09/2014.      PLAN Weekly dose was decreased by 21% to 22 mg per week. Hold today's dose.  Patient Instructions  Patient educated about medication as defined in this  encounter and verbalized understanding by repeating back instructions provided.    Follow-up Return in 8 days (on 06/10/2014) for follow-up INR on 06/10/2014 @ 10:30 AM.  Caroline [name] PharmD Candidate  Caroline [name] Clinical Pharmacist  15 minutes spent face-to-face with the patient during the encounter. 50% of time spent on education. 50% of time was spent on testing and assessment.

## 2014-06-02 NOTE — Patient Instructions (Signed)
Patient educated about medication as defined in this encounter and verbalized understanding by repeating back instructions provided.   

## 2014-06-07 ENCOUNTER — Other Ambulatory Visit: Payer: Self-pay | Admitting: Internal Medicine

## 2014-06-10 ENCOUNTER — Ambulatory Visit (INDEPENDENT_AMBULATORY_CARE_PROVIDER_SITE_OTHER): Payer: Medicare Other | Admitting: Pharmacist

## 2014-06-10 DIAGNOSIS — Z86718 Personal history of other venous thrombosis and embolism: Secondary | ICD-10-CM | POA: Diagnosis not present

## 2014-06-10 DIAGNOSIS — Z7901 Long term (current) use of anticoagulants: Secondary | ICD-10-CM | POA: Diagnosis not present

## 2014-06-10 LAB — POCT INR: INR: 2.9

## 2014-06-10 NOTE — Progress Notes (Signed)
Indication: Recurrent venous thrombosis. Duration: Lifelong. INR: At target. Agree with Dr. Elmyra Ricks assessment and plan.

## 2014-06-10 NOTE — Progress Notes (Signed)
CLINICAL PHARMACIST ANTICOAG NOTE MARTELL RACCA is a 69 y.o. female who reports to the clinic for monitoring of warfarin treatment.    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 The first she describes as "a blood clot in my leg"; the second she describes as "a blood clot in my lung". Only the PE is documented on her problem list as "PE, History of". States she has been on warfarin for "10 years".        ASSESSMENT Recent Results: Recent results are below, the most recent result is correlated with a dose of 22 mg per week: Lab Results  Component Value Date   INR 2.9 06/10/2014   INR 4.5 06/02/2014   INR 2.10 05/12/2014    INR today: Therapeutic  Anticoagulation Dosing: INR as of 06/10/2014 and Previous Dosing Information    INR Dt INR Goal Wkly Tot Sun Mon Tue Wed Thu Fri Sat   06/10/2014 2.9 2.0-3.0 18 mg 4 mg 0 mg 2 mg 4 mg 2 mg 4 mg 2 mg    Previous description        Targeting weekly dose of 22 mg. Patient instructed to take full tablet (4 mg) on Monday 06/09/2014.    Anticoagulation Dose Instructions as of 06/10/2014      Total Sun Mon Tue Wed Thu Fri Sat   New Dose 22 mg 4 mg 4 mg 2 mg 4 mg 2 mg 4 mg 2 mg     (4 mg x 1)  (4 mg x 1)  (4 mg x 0.5)  (4 mg x 1)  (4 mg x 0.5)  (4 mg x 1)  (4 mg x 0.5)                         Description        Targeting weekly dose of 22 mg. Patient instructed to take full tablet (4 mg) on Monday 06/09/2014.      PLAN Weekly dose was unchanged.  Patient  Instructions  Patient educated about medication as defined in this encounter and verbalized understanding by repeating back instructions provided.     Follow-up Return in about 2 weeks (around 06/24/2014) for Follow up INR on 06/24/14 at 1030.  Marzetta Board Clinical Pharmacist  15 minutes spent face-to-face with the patient during the encounter. 50% of time spent on education. 50% of time was spent on assessment and plan.

## 2014-06-10 NOTE — Patient Instructions (Signed)
Patient educated about medication as defined in this encounter and verbalized understanding by repeating back instructions provided.   

## 2014-06-23 ENCOUNTER — Ambulatory Visit (INDEPENDENT_AMBULATORY_CARE_PROVIDER_SITE_OTHER): Payer: Medicare Other | Admitting: Pharmacist

## 2014-06-23 DIAGNOSIS — Z86718 Personal history of other venous thrombosis and embolism: Secondary | ICD-10-CM

## 2014-06-23 DIAGNOSIS — Z7901 Long term (current) use of anticoagulants: Secondary | ICD-10-CM | POA: Diagnosis not present

## 2014-06-23 LAB — POCT INR: INR: 2.3

## 2014-06-23 NOTE — Progress Notes (Signed)
Anti-Coagulation Progress Note  Caroline [name] is a 69 y.o. female who is currently on an anti-coagulation regimen.    RECENT RESULTS: Recent results are below, the most recent result is correlated with a dose of 22 mg. 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 The first she describes as "a blood clot in my leg"; the second she describes as "a blood clot in my lung". Only the PE is documented on her problem list as "PE, History of". States she has been on warfarin for "10 years".         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 The first she describes as "a blood clot in my leg"; the second she describes as "a blood clot in my lung". Only the PE is documented on her problem list as "PE, History of". States she has been on warfarin for "10 years".       PATIENT INSTRUCTIONS: Patient  Instructions  Patient instructed to take medications as defined in the Anti-coagulation Track section of this encounter.  Patient instructed to take today's dose.  Patient verbalized understanding of these instructions.        FOLLOW-UP Return in 4 weeks (on 07/21/2014) for Follow-up INR on 7/11 at 1030.   Russ Halo, PharmD Clinical Pharmacist - Resident Pager: [phone] 6/13/201611:03 AM

## 2014-06-23 NOTE — Patient Instructions (Signed)
Patient instructed to take medications as defined in the Anti-coagulation Track section of this encounter.  Patient instructed to take today's dose.  Patient verbalized understanding of these instructions.    

## 2014-07-01 ENCOUNTER — Emergency Department (HOSPITAL_COMMUNITY): Payer: Medicare Other

## 2014-07-01 ENCOUNTER — Emergency Department (HOSPITAL_COMMUNITY)
Admission: EM | Admit: 2014-07-01 | Discharge: 2014-07-01 | Disposition: A | Discharge status: Home / Self Care | Payer: Medicare Other | Attending: Emergency Medicine | Admitting: Emergency Medicine

## 2014-07-01 ENCOUNTER — Encounter (HOSPITAL_COMMUNITY): Payer: Self-pay | Admitting: Cardiology

## 2014-07-01 DIAGNOSIS — R0981 Nasal congestion: Secondary | ICD-10-CM | POA: Diagnosis not present

## 2014-07-01 DIAGNOSIS — R0602 Shortness of breath: Secondary | ICD-10-CM | POA: Diagnosis not present

## 2014-07-01 DIAGNOSIS — R079 Chest pain, unspecified: Secondary | ICD-10-CM | POA: Diagnosis not present

## 2014-07-01 DIAGNOSIS — Z86711 Personal history of pulmonary embolism: Secondary | ICD-10-CM | POA: Diagnosis not present

## 2014-07-01 DIAGNOSIS — M6281 Muscle weakness (generalized): Secondary | ICD-10-CM | POA: Diagnosis not present

## 2014-07-01 DIAGNOSIS — R5383 Other fatigue: Secondary | ICD-10-CM | POA: Insufficient documentation

## 2014-07-01 DIAGNOSIS — I10 Essential (primary) hypertension: Secondary | ICD-10-CM | POA: Insufficient documentation

## 2014-07-01 DIAGNOSIS — R05 Cough: Secondary | ICD-10-CM | POA: Diagnosis not present

## 2014-07-01 DIAGNOSIS — R110 Nausea: Secondary | ICD-10-CM

## 2014-07-01 DIAGNOSIS — R0789 Other chest pain: Secondary | ICD-10-CM | POA: Diagnosis not present

## 2014-07-01 DIAGNOSIS — M549 Dorsalgia, unspecified: Secondary | ICD-10-CM | POA: Diagnosis not present

## 2014-07-01 DIAGNOSIS — Z880 Allergy status to penicillin: Secondary | ICD-10-CM | POA: Insufficient documentation

## 2014-07-01 DIAGNOSIS — Z87891 Personal history of nicotine dependence: Secondary | ICD-10-CM | POA: Insufficient documentation

## 2014-07-01 DIAGNOSIS — R1084 Generalized abdominal pain: Secondary | ICD-10-CM | POA: Diagnosis not present

## 2014-07-01 DIAGNOSIS — Z7901 Long term (current) use of anticoagulants: Secondary | ICD-10-CM | POA: Diagnosis not present

## 2014-07-01 DIAGNOSIS — Z86718 Personal history of other venous thrombosis and embolism: Secondary | ICD-10-CM | POA: Insufficient documentation

## 2014-07-01 DIAGNOSIS — R109 Unspecified abdominal pain: Secondary | ICD-10-CM | POA: Diagnosis not present

## 2014-07-01 DIAGNOSIS — Z79899 Other long term (current) drug therapy: Secondary | ICD-10-CM | POA: Diagnosis not present

## 2014-07-01 DIAGNOSIS — E669 Obesity, unspecified: Secondary | ICD-10-CM | POA: Diagnosis not present

## 2014-07-01 DIAGNOSIS — R531 Weakness: Secondary | ICD-10-CM | POA: Diagnosis not present

## 2014-07-01 DIAGNOSIS — J984 Other disorders of lung: Secondary | ICD-10-CM | POA: Diagnosis not present

## 2014-07-01 DIAGNOSIS — E119 Type 2 diabetes mellitus without complications: Secondary | ICD-10-CM | POA: Diagnosis not present

## 2014-07-01 LAB — COMPREHENSIVE METABOLIC PANEL
ALT: 23 U/L (ref 14–54)
AST: 21 U/L (ref 15–41)
Albumin: 3.3 g/dL — ABNORMAL LOW (ref 3.5–5.0)
Alkaline Phosphatase: 76 U/L (ref 38–126)
Anion gap: 7 (ref 5–15)
BUN: 8 mg/dL (ref 6–20)
CO2: 29 mmol/L (ref 22–32)
Calcium: 9.2 mg/dL (ref 8.9–10.3)
Chloride: 103 mmol/L (ref 101–111)
Creatinine, Ser: 0.74 mg/dL (ref 0.44–1.00)
GFR calc Af Amer: >60 mL/min (ref >60)
GFR calc non Af Amer: >60 mL/min (ref >60)
Glucose, Bld: 170 mg/dL — ABNORMAL HIGH (ref 65–99)
Potassium: 3.8 mmol/L (ref 3.5–5.1)
Sodium: 139 mmol/L (ref 135–145)
Total Bilirubin: 0.4 mg/dL (ref 0.3–1.2)
Total Protein: 7 g/dL (ref 6.5–8.1)

## 2014-07-01 LAB — CBC WITH DIFFERENTIAL/PLATELET
Basophils Absolute: 0 10*3/uL (ref 0.0–0.1)
Basophils Relative: 0 % (ref 0–1)
Eosinophils Absolute: 0.2 10*3/uL (ref 0.0–0.7)
Eosinophils Relative: 3 % (ref 0–5)
HCT: 37.5 % (ref 36.0–46.0)
Hemoglobin: 11.9 g/dL — ABNORMAL LOW (ref 12.0–15.0)
Lymphocytes Relative: 45 % (ref 12–46)
Lymphs Abs: 2.9 10*3/uL (ref 0.7–4.0)
MCH: 25.3 pg — ABNORMAL LOW (ref 26.0–34.0)
MCHC: 31.7 g/dL (ref 30.0–36.0)
MCV: 79.6 fL (ref 78.0–100.0)
Monocytes Absolute: 0.4 10*3/uL (ref 0.1–1.0)
Monocytes Relative: 7 % (ref 3–12)
Neutro Abs: 2.8 10*3/uL (ref 1.7–7.7)
Neutrophils Relative %: 45 % (ref 43–77)
Platelets: 191 10*3/uL (ref 150–400)
RBC: 4.71 MIL/uL (ref 3.87–5.11)
RDW: 14.7 % (ref 11.5–15.5)
WBC: 6.3 10*3/uL (ref 4.0–10.5)

## 2014-07-01 LAB — URINE MICROSCOPIC-ADD ON

## 2014-07-01 LAB — I-STAT TROPONIN, ED
Troponin i, poc: 0 ng/mL (ref 0.00–0.08)
Troponin i, poc: 0 ng/mL (ref 0.00–0.08)

## 2014-07-01 LAB — URINALYSIS, ROUTINE W REFLEX MICROSCOPIC
Bilirubin Urine: NEGATIVE
Glucose, UA: NEGATIVE mg/dL
Ketones, ur: NEGATIVE mg/dL
Nitrite: NEGATIVE
Protein, ur: NEGATIVE mg/dL
Specific Gravity, Urine: 1.008 (ref 1.005–1.030)
Urobilinogen, UA: 0.2 mg/dL (ref 0.0–1.0)
pH: 6.5 (ref 5.0–8.0)

## 2014-07-01 LAB — PROTIME-INR
INR: 2.54 — ABNORMAL HIGH (ref 0.00–1.49)
Prothrombin Time: 27 seconds — ABNORMAL HIGH (ref 11.6–15.2)

## 2014-07-01 LAB — LIPASE, BLOOD: Lipase: 34 U/L (ref 22–51)

## 2014-07-01 MED ORDER — ONDANSETRON HCL 4 MG/2ML IJ SOLN
4.0000 mg | Freq: Once | INTRAMUSCULAR | Status: AC
  Administered 2014-07-01: 4 mg via INTRAVENOUS
  Filled 2014-07-01: qty 2

## 2014-07-01 MED ORDER — HYDROCHLOROTHIAZIDE 25 MG PO TABS
25.0000 mg | ORAL_TABLET | Freq: Every day | ORAL | Status: DC
  Administered 2014-07-01: 25 mg via ORAL
  Filled 2014-07-01: qty 1

## 2014-07-01 MED ORDER — METOPROLOL TARTRATE 25 MG PO TABS
25.0000 mg | ORAL_TABLET | Freq: Once | ORAL | Status: AC
  Administered 2014-07-01: 25 mg via ORAL
  Filled 2014-07-01: qty 1

## 2014-07-01 MED ORDER — ASPIRIN 325 MG PO TABS
325.0000 mg | ORAL_TABLET | Freq: Once | ORAL | Status: DC
  Filled 2014-07-01: qty 1

## 2014-07-01 NOTE — ED Notes (Signed)
Pt to department via EMS- pt reports some right flank pain that started this morning with some nausea. Reports she used the bathroom and took some tylenol and felt a little better. Bp-152/71 Hr-90 RR-16 No pain on arrivial

## 2014-07-01 NOTE — ED Provider Notes (Signed)
CSN: [id number]     Arrival date & time 07/01/14  0751 History   First MD Initiated Contact with Patient 07/01/14 [phone]     Chief Complaint  Patient presents with  . Abdominal Pain  . Chest Pain     (Consider location/radiation/quality/duration/timing/severity/associated sxs/prior Treatment) HPI Pt is a 69yo female with hx of HTN, DM, vertigo, DVT, and PE, brought to ED via EMS from home with initial reports of Right sided flank pain.  Per pt, pt woke this morning "not feeling well'' with Right sided Breast pain, associated mild SOB, and cough with congestion but also notes she has seasonal allergies.  Pt also c/o generalized abdominal pain and nausea. Pain comes and goes.  Pt states pain was mild to moderate but has since resolved after using the bathroom and taking tylenol.  Denies urinary or vaginal symptoms. Denies vomiting or diarrhea. Denies sick contacts or recent travel. Denies hx of CAD. States she f/u with her PCP regularly and states they have checked her heart.  Denies hx of kidney stones. No recent injuries. Pt does state she has not been sleeping well at night, tossing and turning, stating she is worrying about things she shouldn't but did not go into detail.  States she has not taken her BP medications for the morning but otherwise  takes them as prescribed.   Past Medical History  Diagnosis Date  . Hypertension   . Diabetes mellitus   . Vertigo   . DVT, lower extremity, recurrent     Patient had unprovoked PE on 2002 and DVT in right lower extremety 2008.  . PE (pulmonary embolism)     Patient had unprovoked PE on 2002   History reviewed. No pertinent past surgical history. History reviewed. No pertinent family history. History  Substance Use Topics  . Smoking status: Former Games developer  . Smokeless tobacco: Not on file  . Alcohol Use: No   OB History    No data available     Review of Systems  Constitutional: Positive for fatigue. Negative for fever, chills,  diaphoresis and appetite change.  HENT: Positive for congestion.   Respiratory: Positive for cough and shortness of breath.   Cardiovascular: Positive for chest pain (Right side). Negative for palpitations and leg swelling.  Gastrointestinal: Positive for nausea and abdominal pain. Negative for vomiting, diarrhea and constipation.  Genitourinary: Positive for flank pain (Right). Negative for dysuria, urgency, frequency, hematuria, decreased urine volume, vaginal bleeding and pelvic pain.  Musculoskeletal: Positive for myalgias and back pain.  Neurological: Positive for weakness ( generalized).  All other systems reviewed and are negative.     Allergies  Penicillins; Shellfish allergy; and Tomato  Home Medications   Prior to Admission medications   Medication Sig Start Date End Date Taking? Authorizing Provider  acetaminophen (TYLENOL) 500 MG tablet Take 500 mg by mouth every 6 (six) hours as needed for mild pain or moderate pain. pain   Yes Historical Provider, MD  ATROVENT HFA 17 MCG/ACT inhaler INHALE 2 PUFFS INTO THE LUNGS 4 TIMES DAILY. Patient taking differently: INHALE 2 PUFFS INTO THE LUNGS 2 TIMES DAILY. 02/12/14  Yes Carly Arlyce Harman, MD  hydrochlorothiazide (HYDRODIURIL) 25 MG tablet Take 1 tablet (25 mg total) by mouth daily. 01/01/14  Yes [name], MD  hydrocortisone (ANUSOL-HC) 2.5 % rectal cream Place 1 application rectally 2 (two) times daily. 05/21/13  Yes Lorretta Harp, MD  meclizine (ANTIVERT) 25 MG tablet TAKE 1 TABLET BY MOUTH 3 TIMES A DAY AS  NEEDED 05/07/14  Yes Carly Arlyce Harman, MD  metoprolol tartrate (LOPRESSOR) 25 MG tablet TAKE 1 TABLET (25 MG TOTAL) BY MOUTH 2 (TWO) TIMES DAILY. 05/01/14  Yes Carly J Rivet, MD  montelukast (SINGULAIR) 10 MG tablet Take 1 tablet (10 mg total) by mouth at bedtime. 01/01/14  Yes [name], MD  omeprazole (PRILOSEC) 20 MG capsule TAKE 1 CAPSULE (20 MG TOTAL) BY MOUTH DAILY. Patient taking differently: TAKE 1 CAPSULE (20 MG TOTAL) BY  MOUTH AS NEEDED 11/28/13  Yes Carly J Rivet, MD  pravastatin (PRAVACHOL) 80 MG tablet TAKE 1 TABLET (80 MG TOTAL) BY MOUTH DAILY. 02/27/14  Yes Carly Arlyce Harman, MD  PROAIR HFA 108 (90 BASE) MCG/ACT inhaler INHALE 2 PUFFS INTO THE LUNGS EVERY 6 (SIX) HOURS AS NEEDED FOR WHEEZING OR SHORTNESS OF BREATH. Patient taking differently: INHALE 2 PUFFS INTO THE LUNGS TWICE DAILY 05/19/14  Yes Carly Arlyce Harman, MD  SYMBICORT 80-4.5 MCG/ACT inhaler INHALE 2 PUFFS INTO THE LUNGS 2 TIMES DAILY 03/26/14  Yes Carly Arlyce Harman, MD  warfarin (COUMADIN) 4 MG tablet Take 1 tablet by mouth daily, except take 1/2 tablet on Tue, Thur, Sat. Patient taking differently: Take 1 tablet by mouth daily, except take 1/2 tablet on Wed and Sat. 06/02/14  Yes Burns Spain, MD  hydrochlorothiazide (HYDRODIURIL) 25 MG tablet TAKE 1 TABLET (25 MG TOTAL) BY MOUTH DAILY. 05/01/14   Carly J Rivet, MD  montelukast (SINGULAIR) 10 MG tablet TAKE 1 TABLET (10 MG TOTAL) BY MOUTH AT BEDTIME. 05/01/14   Carly J Rivet, MD  omeprazole (PRILOSEC) 20 MG capsule TAKE 1 CAPSULE (20 MG TOTAL) BY MOUTH DAILY. 05/01/14   Carly Arlyce Harman, MD  pravastatin (PRAVACHOL) 80 MG tablet TAKE 1 TABLET (80 MG TOTAL) BY MOUTH DAILY. 05/01/14   Su Hoff, MD  SYMBICORT 80-4.5 MCG/ACT inhaler INHALE 2 PUFFS INTO THE LUNGS 2 TIMES DAILY 05/01/14   Su Hoff, MD  SYMBICORT 80-4.5 MCG/ACT inhaler INHALE 2 PUFFS INTO THE LUNGS 2 TIMES DAILY 04/28/14   Su Hoff, MD  warfarin (COUMADIN) 4 MG tablet TAKE ACCORDING TO DIRECTIONS 06/10/14   Su Hoff, MD   BP 129/79 mmHg  Pulse 57  Temp(Src) 98.8 F (37.1 C) (Oral)  Resp 15  Ht 5' 2.5" (1.588 m)  Wt 280 lb (127.007 kg)  BMI 50.36 kg/m2  SpO2 97% Physical Exam  Constitutional: She appears well-developed and well-nourished. No distress.  Obese female lying in exam bed, NAD. Non-toxic appearing.  HENT:  Head: Normocephalic and atraumatic.  Eyes: Conjunctivae are normal. No scleral icterus.  Neck: Normal range of  motion. Neck supple.  Cardiovascular: Normal rate, regular rhythm and normal heart sounds.   Pulmonary/Chest: Effort normal and breath sounds normal. No respiratory distress. She has no wheezes. She has no rales. She exhibits no tenderness.  Abdominal: Soft. Bowel sounds are normal. She exhibits no distension and no mass. There is no tenderness. There is no rebound, no guarding and no CVA tenderness.  Musculoskeletal: Normal range of motion.  Neurological: She is alert.  Skin: Skin is warm and dry. She is not diaphoretic.  Nursing note and vitals reviewed.   ED Course  Procedures (including critical care time) Labs Review Labs Reviewed  CBC WITH DIFFERENTIAL/PLATELET - Abnormal; Notable for the following:    Hemoglobin 11.9 (*)    MCH 25.3 (*)    All other components within normal limits  COMPREHENSIVE METABOLIC PANEL - Abnormal; Notable for the following:  Glucose, Bld 170 (*)    Albumin 3.3 (*)    All other components within normal limits  URINALYSIS, ROUTINE W REFLEX MICROSCOPIC (NOT AT [facility]) - Abnormal; Notable for the following:    Hgb urine dipstick SMALL (*)    Leukocytes, UA SMALL (*)    All other components within normal limits  URINE MICROSCOPIC-ADD ON - Abnormal; Notable for the following:    Squamous Epithelial / LPF FEW (*)    Bacteria, UA FEW (*)    All other components within normal limits  PROTIME-INR - Abnormal; Notable for the following:    Prothrombin Time 27.0 (*)    INR 2.54 (*)    All other components within normal limits  LIPASE, BLOOD  I-STAT TROPOININ, ED  I-STAT TROPOININ, ED    Imaging Review Dg Chest 2 View  07/01/2014   CLINICAL DATA:  Abdominal pain  EXAM: CHEST  2 VIEW  COMPARISON:  01/03/2011  FINDINGS: Cardiac enlargement without heart failure. Mild scarring in the lung bases is stable. Negative for pneumonia or effusion. Negative for mass lesion.  IMPRESSION: No active cardiopulmonary disease.   Electronically Signed   By: Marlan Palau  M.D.   On: 07/01/2014 09:11     EKG Interpretation   Date/Time:  Tuesday July 01 2014 08:01:38 EDT Ventricular Rate:  89 PR Interval:  175 QRS Duration: 145 QT Interval:  429 QTC Calculation: 522 R Axis:   -50 Text Interpretation:  Sinus rhythm RBBB and LAFB Left ventricular  hypertrophy Baseline wander in lead(s) V6 No significant change since last  tracing Confirmed by DOCHERTY  MD, MEGAN ([id number]) on 07/01/2014 8:10:53 AM      MDM   Final diagnoses:  Right-sided chest pain  Right flank pain  Nausea  Generalized weakness   Pt is a 69yo female brought to ED by EMS with initial c/o Right flank pain, then c/o Right sided CP, nausea, fatigue, and SOB. Cardiac and abdominal workup performed.  CP atypical for ACS. EKG: no significant change since prior.  Delta troponin negative for elevation. CXR: no active cardiopulmonary disease. Labs: unremarkable  Pt currently on warfarin, INR within therapeutic level. Doubt PE.  No explanation for pt's reported pain, possibly muscular in nature.  Discussed pt with Dr. Micheline Maze who also examined pt.  Pt is safe for discharge home. F/u with PCP as needed. Return precautions provided. Pt verbalized understanding and agreement with tx plan.   Junius Finner, PA-C 07/01/14 1607  Toy Cookey, MD 07/02/14 1120

## 2014-07-16 ENCOUNTER — Other Ambulatory Visit: Payer: Self-pay | Admitting: Internal Medicine

## 2014-07-18 ENCOUNTER — Telehealth: Payer: Self-pay | Admitting: Pharmacist

## 2014-07-18 NOTE — Telephone Encounter (Signed)
Call to patient to confirm appointment for 07/21/14 at 10:30 lmtcb ° °

## 2014-07-21 ENCOUNTER — Ambulatory Visit (INDEPENDENT_AMBULATORY_CARE_PROVIDER_SITE_OTHER): Payer: Medicare Other | Admitting: Pharmacist

## 2014-07-21 DIAGNOSIS — Z86711 Personal history of pulmonary embolism: Secondary | ICD-10-CM

## 2014-07-21 DIAGNOSIS — Z7901 Long term (current) use of anticoagulants: Secondary | ICD-10-CM

## 2014-07-21 DIAGNOSIS — Z86718 Personal history of other venous thrombosis and embolism: Secondary | ICD-10-CM

## 2014-07-21 LAB — POCT INR: INR: 3.7

## 2014-07-21 NOTE — Progress Notes (Signed)
Anti-Coagulation Progress Note  Caroline [name] is a 69 y.o. female who is currently on an anti-coagulation regimen.    RECENT RESULTS: Recent results are below, the most recent result is correlated with a dose of 24 mg. per week: Lab Results  Component Value Date   INR 3.70 07/21/2014   INR 2.54* 07/01/2014   INR 2.30 06/23/2014    ANTI-COAG DOSE: Anticoagulation Dose Instructions as of 07/21/2014      [name] Tue Wed Thu Fri Sat   New Dose 4 mg 2 mg 4 mg 2 mg 4 mg 2 mg 4 mg       ANTICOAG SUMMARY: Anticoagulation Episode Summary    Current INR goal 2.0-3.0  Next INR check 08/11/2014  INR from last check 3.70! (07/21/2014)  Weekly max dose   Target end date Indefinite  [facility] check location [facility]  Preferred lab   Send INR reminders to    Indications  PULMONARY EMBOLISM HX OF [Z86.718] Encounter for long-term (current) use of anticoagulants [Z79.01]        Comments 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 The first she describes as "a blood clot in my leg"; the second she describes as "a blood clot in my lung". Only the PE is documented on her problem list as "PE, History of". States she has been on warfarin for "10 years".         ANTICOAG TODAY: Anticoagulation Summary as of 07/21/2014    INR goal 2.0-3.0  Selected INR 3.70! (07/21/2014)  Next INR check 08/11/2014  Target end date Indefinite   Indications  PULMONARY EMBOLISM HX OF [Z86.718] Encounter for long-term (current) use of anticoagulants [Z79.01]      Anticoagulation Episode Summary    [facility] check [facility]   Preferred lab    Send INR reminders to    Comments 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 The first she describes as "a blood clot in my leg"; the second she describes as "a blood clot in my lung". Only the PE is documented on her problem list as "PE, History of". States she has been on warfarin for "10 years".       PATIENT INSTRUCTIONS: Patient  Instructions  Patient instructed to take medications as defined in the Anti-coagulation Track section of this encounter.  Patient instructed to take today's dose.  Patient verbalized understanding of these instructions.       FOLLOW-UP Return in 3 weeks (on 08/11/2014) for Follow up INR at 1030h.  Hulen Luster, III Pharm.D., CACP

## 2014-07-21 NOTE — Patient Instructions (Signed)
Patient instructed to take medications as defined in the Anti-coagulation Track section of this encounter.  Patient instructed to take today's dose.  Patient verbalized understanding of these instructions.    

## 2014-08-11 ENCOUNTER — Ambulatory Visit (INDEPENDENT_AMBULATORY_CARE_PROVIDER_SITE_OTHER): Payer: Medicare Other | Admitting: Pharmacist

## 2014-08-11 DIAGNOSIS — Z7901 Long term (current) use of anticoagulants: Secondary | ICD-10-CM | POA: Diagnosis not present

## 2014-08-11 DIAGNOSIS — Z86711 Personal history of pulmonary embolism: Secondary | ICD-10-CM

## 2014-08-11 DIAGNOSIS — Z86718 Personal history of other venous thrombosis and embolism: Secondary | ICD-10-CM

## 2014-08-11 LAB — POCT INR: INR: 2.4

## 2014-08-11 NOTE — Progress Notes (Signed)
Anti-Coagulation Progress Note  Caroline [name] is a 69 y.o. female who is currently on an anti-coagulation regimen.    RECENT RESULTS: Recent results are below, the most recent result is correlated with a dose of 22 mg. per week: Lab Results  Component Value Date   INR 2.40 08/11/2014   INR 3.70 07/21/2014   INR 2.54* 07/01/2014    ANTI-COAG DOSE: Anticoagulation Dose Instructions as of 08/11/2014      [name] Tue Wed Thu Fri Sat   New Dose 4 mg 2 mg 4 mg 2 mg 4 mg 2 mg 4 mg       ANTICOAG SUMMARY: Anticoagulation Episode Summary    Current INR goal 2.0-3.0  Next INR check 09/08/2014  INR from last check 2.40 (08/11/2014)  Weekly max dose   Target end date Indefinite  [facility] check location [facility]  Preferred lab   Send INR reminders to    Indications  PULMONARY EMBOLISM HX OF [Z86.718] Encounter for long-term (current) use of anticoagulants [Z79.01]        Comments 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 The first she describes as "a blood clot in my leg"; the second she describes as "a blood clot in my lung". Only the PE is documented on her problem list as "PE, History of". States she has been on warfarin for "10 years".         ANTICOAG TODAY: Anticoagulation Summary as of 08/11/2014    INR goal 2.0-3.0  Selected INR 2.40 (08/11/2014)  Next INR check 09/08/2014  Target end date Indefinite   Indications  PULMONARY EMBOLISM HX OF [Z86.718] Encounter for long-term (current) use of anticoagulants [Z79.01]      Anticoagulation Episode Summary    [facility] check location [facility]   Preferred lab    Send INR reminders to    Comments 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 The first she describes as "a blood clot in my leg"; the second she describes as "a blood clot in my lung". Only the PE is documented on her problem list as "PE, History of". States she has been on warfarin for "10 years".       PATIENT INSTRUCTIONS: Patient  Instructions  Patient instructed to take medications as defined in the Anti-coagulation Track section of this encounter.  Patient instructed to take today's dose.  Patient verbalized understanding of these instructions.       FOLLOW-UP Return in 4 weeks (on 09/08/2014) for Follow up INR at 1030h.  Hulen Luster, III Pharm.D., CACP

## 2014-08-11 NOTE — Patient Instructions (Signed)
Patient instructed to take medications as defined in the Anti-coagulation Track section of this encounter.  Patient instructed to take today's dose.  Patient verbalized understanding of these instructions.    

## 2014-08-19 NOTE — Progress Notes (Signed)
I have reviewed Dr. Saralyn Pilar note.  Caroline [name] is on anticoagulation for recurrent VTE.  Her INR is appropriate.

## 2014-09-08 ENCOUNTER — Ambulatory Visit (INDEPENDENT_AMBULATORY_CARE_PROVIDER_SITE_OTHER): Payer: Medicare Other | Admitting: Pharmacist

## 2014-09-08 DIAGNOSIS — Z7901 Long term (current) use of anticoagulants: Secondary | ICD-10-CM

## 2014-09-08 DIAGNOSIS — Z86718 Personal history of other venous thrombosis and embolism: Secondary | ICD-10-CM

## 2014-09-08 LAB — POCT INR: INR: 2.8

## 2014-09-08 NOTE — Progress Notes (Signed)
Anti-Coagulation Progress Note  Caroline [name] is a 69 y.o. female who is currently on an anti-coagulation regimen.    RECENT RESULTS: Recent results are below, the most recent result is correlated with a dose of 22 mg. per week: Lab Results  Component Value Date   INR 2.80 09/08/2014   INR 2.40 08/11/2014   INR 3.70 07/21/2014    ANTI-COAG DOSE: Anticoagulation Dose Instructions as of 09/08/2014      [name] Tue Wed Thu Fri Sat   New Dose 4 mg 2 mg 4 mg 2 mg 4 mg 2 mg 4 mg       ANTICOAG SUMMARY: Anticoagulation Episode Summary    Current INR goal 2.0-3.0  Next INR check 10/06/2014  INR from last check 2.80 (09/08/2014)  Weekly max dose   Target end date Indefinite  [facility] check location [facility]  Preferred lab   Send INR reminders to    Indications  PULMONARY EMBOLISM HX OF [Z86.718] Encounter for long-term (current) use of anticoagulants (Resolved) [Z79.01]        Comments 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 The first she describes as "a blood clot in my leg"; the second she describes as "a blood clot in my lung". Only the PE is documented on her problem list as "PE, History of". States she has been on warfarin for "10 years".         ANTICOAG TODAY: Anticoagulation Summary as of 09/08/2014    INR goal 2.0-3.0  Selected INR 2.80 (09/08/2014)  Next INR check 10/06/2014  Target end date Indefinite   Indications  PULMONARY EMBOLISM HX OF [Z86.718] Encounter for long-term (current) use of anticoagulants (Resolved) [Z79.01]      Anticoagulation Episode Summary    [facility] check location [facility]   Preferred lab    Send INR reminders to    Comments 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 The first she describes as "a blood clot in my leg"; the second she describes as "a blood clot in my lung". Only the PE is documented on her problem list as "PE, History of". States she has been on warfarin for "10 years".       PATIENT  INSTRUCTIONS: Patient Instructions  Patient instructed to take medications as defined in the Anti-coagulation Track section of this encounter.  Patient instructed to take today's dose.  Patient verbalized understanding of these instructions.       FOLLOW-UP Return in 4 weeks (on 10/06/2014) for Follow up INR at 1030h.  Hulen Luster, III Pharm.D., CACP

## 2014-09-08 NOTE — Patient Instructions (Signed)
Patient instructed to take medications as defined in the Anti-coagulation Track section of this encounter.  Patient instructed to take today's dose.  Patient verbalized understanding of these instructions.    

## 2014-09-18 ENCOUNTER — Other Ambulatory Visit: Payer: Self-pay | Admitting: Internal Medicine

## 2014-09-24 ENCOUNTER — Other Ambulatory Visit: Payer: Self-pay | Admitting: Internal Medicine

## 2014-09-27 ENCOUNTER — Other Ambulatory Visit: Payer: Self-pay | Admitting: Internal Medicine

## 2014-09-30 ENCOUNTER — Other Ambulatory Visit: Payer: Self-pay | Admitting: Internal Medicine

## 2014-10-04 ENCOUNTER — Other Ambulatory Visit: Payer: Self-pay | Admitting: Internal Medicine

## 2014-10-06 ENCOUNTER — Other Ambulatory Visit: Payer: Self-pay | Admitting: Internal Medicine

## 2014-10-06 ENCOUNTER — Ambulatory Visit (INDEPENDENT_AMBULATORY_CARE_PROVIDER_SITE_OTHER): Payer: Medicare Other | Admitting: Pharmacist

## 2014-10-06 DIAGNOSIS — Z7901 Long term (current) use of anticoagulants: Secondary | ICD-10-CM

## 2014-10-06 DIAGNOSIS — Z86711 Personal history of pulmonary embolism: Secondary | ICD-10-CM

## 2014-10-06 DIAGNOSIS — Z86718 Personal history of other venous thrombosis and embolism: Secondary | ICD-10-CM

## 2014-10-06 LAB — POCT INR: INR: 2.6

## 2014-10-06 MED ORDER — WARFARIN SODIUM 4 MG PO TABS: ORAL_TABLET | ORAL | Status: DC

## 2014-10-06 NOTE — Progress Notes (Signed)
Anti-Coagulation Progress Note  Caroline [name] is a 69 y.o. female who is currently on an anti-coagulation regimen.    RECENT RESULTS: Recent results are below, the most recent result is correlated with a dose of 22 mg. per week: Lab Results  Component Value Date   INR 2.60 10/06/2014   INR 2.80 09/08/2014   INR 2.40 08/11/2014    ANTI-COAG DOSE: Anticoagulation Dose Instructions as of 10/06/2014      [name] Tue Wed Thu Fri Sat   New Dose 4 mg 2 mg 4 mg 2 mg 4 mg 2 mg 4 mg       ANTICOAG SUMMARY: Anticoagulation Episode Summary    Current INR goal 2.0-3.0  Next INR check 11/03/2014  INR from last check 2.60 (10/06/2014)  Weekly max dose   Target end date Indefinite  [facility] check location [facility]  Preferred lab   Send INR reminders to    Indications  PULMONARY EMBOLISM HX OF [Z86.718] Encounter for long-term (current) use of anticoagulants (Resolved) [Z79.01]        Comments 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 The first she describes as "a blood clot in my leg"; the second she describes as "a blood clot in my lung". Only the PE is documented on her problem list as "PE, History of". States she has been on warfarin for "10 years".         ANTICOAG TODAY: Anticoagulation Summary as of 10/06/2014    INR goal 2.0-3.0  Selected INR 2.60 (10/06/2014)  Next INR check 11/03/2014  Target end date Indefinite   Indications  PULMONARY EMBOLISM HX OF [Z86.718] Encounter for long-term (current) use of anticoagulants (Resolved) [Z79.01]      Anticoagulation Episode Summary    [facility] check location [facility]   Preferred lab    Send INR reminders to    Comments 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 The first she describes as "a blood clot in my leg"; the second she describes as "a blood clot in my lung". Only the PE is documented on her problem list as "PE, History of". States she has been on warfarin for "10 years".       PATIENT  INSTRUCTIONS: Patient Instructions  Patient instructed to take medications as defined in the Anti-coagulation Track section of this encounter.  Patient instructed to take today's dose.  Patient verbalized understanding of these instructions.       FOLLOW-UP Return in 4 weeks (on 11/03/2014) for Follow up INR at 1015h.  Hulen Luster, III Pharm.D., CACP

## 2014-10-06 NOTE — Patient Instructions (Signed)
Patient instructed to take medications as defined in the Anti-coagulation Track section of this encounter.  Patient instructed to take today's dose.  Patient verbalized understanding of these instructions.    

## 2014-10-07 NOTE — Progress Notes (Signed)
I have reviewed Dr. Saralyn Pilar note.  She is on anticoagulation for recurrent VTE.  INR at goal.

## 2014-11-03 ENCOUNTER — Ambulatory Visit (INDEPENDENT_AMBULATORY_CARE_PROVIDER_SITE_OTHER): Payer: Medicare Other | Admitting: Internal Medicine

## 2014-11-03 ENCOUNTER — Encounter: Payer: Self-pay | Admitting: Internal Medicine

## 2014-11-03 ENCOUNTER — Ambulatory Visit (INDEPENDENT_AMBULATORY_CARE_PROVIDER_SITE_OTHER): Payer: Medicare Other | Admitting: Pharmacist

## 2014-11-03 VITALS — BP 153/92 | HR 83 | Temp 98.2°F | Ht 63.5 in | Wt 289.2 lb

## 2014-11-03 DIAGNOSIS — R42 Dizziness and giddiness: Secondary | ICD-10-CM

## 2014-11-03 DIAGNOSIS — Z7901 Long term (current) use of anticoagulants: Secondary | ICD-10-CM

## 2014-11-03 DIAGNOSIS — Z7951 Long term (current) use of inhaled steroids: Secondary | ICD-10-CM

## 2014-11-03 DIAGNOSIS — Z23 Encounter for immunization: Secondary | ICD-10-CM | POA: Diagnosis not present

## 2014-11-03 DIAGNOSIS — I10 Essential (primary) hypertension: Secondary | ICD-10-CM | POA: Diagnosis not present

## 2014-11-03 DIAGNOSIS — Z86711 Personal history of pulmonary embolism: Secondary | ICD-10-CM | POA: Diagnosis not present

## 2014-11-03 DIAGNOSIS — Z86718 Personal history of other venous thrombosis and embolism: Secondary | ICD-10-CM

## 2014-11-03 DIAGNOSIS — J309 Allergic rhinitis, unspecified: Secondary | ICD-10-CM

## 2014-11-03 DIAGNOSIS — Z0000 Encounter for general adult medical examination without abnormal findings: Secondary | ICD-10-CM

## 2014-11-03 DIAGNOSIS — J4530 Mild persistent asthma, uncomplicated: Secondary | ICD-10-CM

## 2014-11-03 DIAGNOSIS — E785 Hyperlipidemia, unspecified: Secondary | ICD-10-CM

## 2014-11-03 DIAGNOSIS — I82409 Acute embolism and thrombosis of unspecified deep veins of unspecified lower extremity: Secondary | ICD-10-CM

## 2014-11-03 LAB — POCT INR: INR: 3

## 2014-11-03 MED ORDER — BUDESONIDE 180 MCG/ACT IN AEPB: 2.0000 | INHALATION_SPRAY | Freq: Two times a day (BID) | RESPIRATORY_TRACT | Status: DC

## 2014-11-03 NOTE — Progress Notes (Signed)
Anti-Coagulation Progress Note  Caroline [name] is a 69 y.o. female who is currently on an anti-coagulation regimen.    RECENT RESULTS: Recent results are below, the most recent result is correlated with a dose of 22 mg. per week: Lab Results  Component Value Date   INR 3.00 11/03/2014   INR 2.60 10/06/2014   INR 2.80 09/08/2014    ANTI-COAG DOSE: Anticoagulation Dose Instructions as of 11/03/2014      [name] Tue Wed Thu Fri Sat   New Dose 2 mg 2 mg 4 mg 2 mg 4 mg 2 mg 4 mg       ANTICOAG SUMMARY: Anticoagulation Episode Summary    Current INR goal 2.0-3.0  Next INR check 12/01/2014  INR from last check 3.00 (11/03/2014)  Weekly max dose   Target end date Indefinite  [facility] check location [facility]  Preferred lab   Send INR reminders to    Indications  PULMONARY EMBOLISM HX OF [Z86.718] Encounter for long-term (current) use of anticoagulants (Resolved) [Z79.01]        Comments 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 The first she describes as "a blood clot in my leg"; the second she describes as "a blood clot in my lung". Only the PE is documented on her problem list as "PE, History of". States she has been on warfarin for "10 years".         ANTICOAG TODAY: Anticoagulation Summary as of 11/03/2014    INR goal 2.0-3.0  Selected INR 3.00 (11/03/2014)  Next INR check 12/01/2014  Target end date Indefinite   Indications  PULMONARY EMBOLISM HX OF [Z86.718] Encounter for long-term (current) use of anticoagulants (Resolved) [Z79.01]      Anticoagulation Episode Summary    [facility] check location [facility]   Preferred lab    Send INR reminders to    Comments 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 The first she describes as "a blood clot in my leg"; the second she describes as "a blood clot in my lung". Only the PE is documented on her problem list as "PE, History of". States she has been on warfarin for "10 years".        PATIENT INSTRUCTIONS: Patient Instructions  Patient instructed to take medications as defined in the Anti-coagulation Track section of this encounter.  Patient instructed to take today's dose.  Patient verbalized understanding of these instructions.       FOLLOW-UP Return in 4 weeks (on 12/01/2014) for Follow up INR at 1015h.  Hulen Luster, III Pharm.D., CACP

## 2014-11-03 NOTE — Patient Instructions (Signed)
-   Switch to Budesonide inhaler from Pulmicort inhaler - Complete stool cards and bring back to clinic  General Instructions:   Please bring your medicines with you each time you come to clinic.  Medicines may include prescription medications, over-the-counter medications, herbal remedies, eye drops, vitamins, or other pills.   Progress Toward Treatment Goals:  Treatment Goal 04/08/2014  Blood pressure at goal    Self Care Goals & Plans:  Self Care Goal 11/03/2014  Manage my medications take my medicines as prescribed; bring my medications to every visit; refill my medications on time; follow the sick day instructions if I am sick  Eat healthy foods eat more vegetables; eat fruit for snacks and desserts; eat foods that are low in salt; eat baked foods instead of fried foods  Be physically active find an activity I enjoy  Meeting treatment goals -    No flowsheet data found.   Care Management & Community Referrals:  Referral 10/30/2012  Referrals made for care management support none needed

## 2014-11-03 NOTE — Progress Notes (Signed)
   Subjective:    Patient ID: Caroline [name], female    DOB: [birth year], 69 y.o.   MRN: [id number]  HPI 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  HTN: BP mildly elevated today 153/92. Patient reports compliance with her HCTZ 25 mg daily and Lopressor 25 mg BID.   Hx Recurrent DVTs/PE: On warfarin indefinitely. Last INR therapeutic at 2.60. She is seeing Dr. Alexandria Lodge today for an INR check. Denies any bleeding.   Allergic Rhinitis: Denies any allergy symptoms. States she has only needed to take Zyrtec a few times.   Mild Persistent Asthma: Well controlled. She reports taking her singulair, symbicort inhaler BID, and pro-air inhaler PRN. She denies any shortness of breath, cough, or wheezing. Denies any hospitalizations for asthma exacerbations. She received her flu shot this year.   Hyperlipidemia: LDL 110 in March 2016. She is on Pravastatin 80 mg daily.   Vertigo: She reports her vertigo is controlled with Meclizine twice daily. She notes she cannot lay down as this exacerbates her vertigo. She has to sleep sitting up on the cough with 5+ pillows to prevent the symptoms from coming on. Has tried vestibular rehab in the past with no success.   GERD: Well controlled with prilosec.    Review of Systems General: Denies fever, chills, night sweats, changes in weight, changes in appetite HEENT: Denies headaches, ear pain, rhinorrhea, sore throat CV: Denies CP, palpitations, SOB, orthopnea Pulm: Denies SOB, cough, wheezing GI: Denies abdominal pain, nausea, vomiting, diarrhea, constipation, melena, hematochezia GU: Denies dysuria, hematuria, frequency Msk: Denies muscle cramps, joint pains Neuro: Denies weakness, numbness, tingling Skin: Denies rashes, bruising Psych: Denies depression, anxiety, hallucinations    Objective:   Physical Exam General: alert, sitting  up, NAD, pleasant  HEENT: [place]/AT, EOMI, no nystagmus, sclera anicteric, mucus membranes moist CV: RRR, no m/g/r Pulm: CTA bilaterally, breaths non-labored Abd: BS+, soft, obese, non-tender Ext: warm, no peripheral edema Neuro: alert and oriented x 3, strength 5/5 in upper and lower extremities bilaterally     Assessment & Plan:  Please refer to A&P documentation.

## 2014-11-03 NOTE — Assessment & Plan Note (Signed)
Continue Warfarin.

## 2014-11-03 NOTE — Assessment & Plan Note (Addendum)
-   Given stool cards today - Flu vaccine today - Interested in DEXA scan but unable to lie flat due to vertigo

## 2014-11-03 NOTE — Assessment & Plan Note (Signed)
Continue warfarin. Patient to see Dr. Alexandria Lodge today for INR check.

## 2014-11-03 NOTE — Assessment & Plan Note (Signed)
Etiology of her vertigo has not been well established. Her symptoms seem to be controlled with Meclizine BID. She has tried vestibular rehab in the past with no success. Will continue to monitor.

## 2014-11-03 NOTE — Assessment & Plan Note (Signed)
BP Readings from Last 3 Encounters:  11/03/14 153/92  07/01/14 129/79  04/08/14 132/52    Lab Results  Component Value Date   NA 139 07/01/2014   K 3.8 07/01/2014   CREATININE 0.74 07/01/2014    Assessment: Blood pressure control: mildly elevated Progress toward BP goal:  unable to assess Comments: BP mildly elevated today. Has been well controlled in past. Will not make any medication changes today.   Plan: Medications:  Continue HCTZ 25 mg daily and Lopressor 25 mg BID.  Educational resources provided: brochure, handout, video

## 2014-11-03 NOTE — Assessment & Plan Note (Signed)
Asthma well controlled. Will de-escalate her therapy by switching to Budesonide only inhaler from Budesonide-Formoterol. Continue Albuterol PRN and Singulair 10 mg daily.

## 2014-11-03 NOTE — Assessment & Plan Note (Signed)
Continue Pravastatin 80 mg daily.

## 2014-11-03 NOTE — Assessment & Plan Note (Signed)
Asymptomatic currently. Continue Zyrtec as needed.

## 2014-11-03 NOTE — Patient Instructions (Signed)
Patient instructed to take medications as defined in the Anti-coagulation Track section of this encounter.  Patient instructed to take today's dose.  Patient verbalized understanding of these instructions.    

## 2014-11-04 NOTE — Progress Notes (Signed)
Case discussed with Dr. Rivet at time of visit.  We reviewed the resident's history and exam and pertinent patient test results.  I agree with the assessment, diagnosis, and plan of care documented in the resident's note. 

## 2014-11-10 NOTE — Progress Notes (Signed)
Indication: Recurrent venous thromboembolism. Duration: Lifelong. INR: At target. Agree with Dr. Groce's assessment and plan. 

## 2014-11-23 ENCOUNTER — Other Ambulatory Visit: Payer: Self-pay | Admitting: Internal Medicine

## 2014-11-24 ENCOUNTER — Other Ambulatory Visit: Payer: Self-pay | Admitting: Internal Medicine

## 2014-11-24 NOTE — Telephone Encounter (Signed)
Patient switched from Symbicort to Pulmicort. Just gave Rx at visit on 10/24 with 2 refills.

## 2014-12-01 ENCOUNTER — Other Ambulatory Visit: Payer: Self-pay | Admitting: Internal Medicine

## 2014-12-01 ENCOUNTER — Ambulatory Visit (INDEPENDENT_AMBULATORY_CARE_PROVIDER_SITE_OTHER): Payer: Medicare Other | Admitting: Pharmacist

## 2014-12-01 DIAGNOSIS — Z7901 Long term (current) use of anticoagulants: Secondary | ICD-10-CM

## 2014-12-01 LAB — POCT INR: INR: 2.2

## 2014-12-01 NOTE — Patient Instructions (Signed)
Patient instructed to take medications as defined in the Anti-coagulation Track section of this encounter.  Patient instructed to take today's dose.  Patient verbalized understanding of these instructions.    

## 2014-12-01 NOTE — Progress Notes (Signed)
Anti-Coagulation Progress Note  Caroline [name] is a 69 y.o. female who is currently on an anti-coagulation regimen.    RECENT RESULTS: Recent results are below, the most recent result is correlated with a dose of 20 mg. per week: Lab Results  Component Value Date   INR 2.20 12/01/2014   INR 3.00 11/03/2014   INR 2.60 10/06/2014    ANTI-COAG DOSE: Anticoagulation Dose Instructions as of 12/01/2014      [name] Tue Wed Thu Fri Sat   New Dose 2 mg 2 mg 4 mg 2 mg 4 mg 2 mg 4 mg       ANTICOAG SUMMARY: Anticoagulation Episode Summary    Current INR goal    Next INR check 12/29/2014   INR from last check 2.20 (12/01/2014)   Weekly max dose    Target end date    INR check location    Preferred lab    Send INR reminders to       Comments         ANTICOAG TODAY: Anticoagulation Summary as of 12/01/2014    INR goal    Selected INR 2.20 (12/01/2014)   Next INR check 12/29/2014   Target end date     Anticoagulation Episode Summary    INR check location    Preferred lab    Send INR reminders to    Comments       PATIENT INSTRUCTIONS: Patient Instructions  Patient instructed to take medications as defined in the Anti-coagulation Track section of this encounter.  Patient instructed to take today's dose.  Patient verbalized understanding of these instructions.       FOLLOW-UP Return in 4 weeks (on 12/29/2014) for Follow up INR at 1015h.  Hulen Luster, III Pharm.D., CACP

## 2014-12-10 ENCOUNTER — Other Ambulatory Visit: Payer: Self-pay | Admitting: Internal Medicine

## 2014-12-11 ENCOUNTER — Other Ambulatory Visit: Payer: Self-pay | Admitting: Internal Medicine

## 2014-12-12 NOTE — Telephone Encounter (Signed)
Patient should be using Pulmicort instead

## 2014-12-15 ENCOUNTER — Other Ambulatory Visit: Payer: Self-pay | Admitting: Internal Medicine

## 2014-12-15 DIAGNOSIS — Z86718 Personal history of other venous thrombosis and embolism: Secondary | ICD-10-CM

## 2014-12-15 DIAGNOSIS — J4530 Mild persistent asthma, uncomplicated: Secondary | ICD-10-CM

## 2014-12-15 DIAGNOSIS — I10 Essential (primary) hypertension: Secondary | ICD-10-CM

## 2014-12-15 MED ORDER — MONTELUKAST SODIUM 10 MG PO TABS: 10.0000 mg | ORAL_TABLET | Freq: Every day | ORAL | Status: DC

## 2014-12-15 MED ORDER — HYDROCHLOROTHIAZIDE 25 MG PO TABS: 25.0000 mg | ORAL_TABLET | Freq: Every day | ORAL | Status: DC

## 2014-12-15 MED ORDER — HYDROCORTISONE 2.5 % RE CREA: TOPICAL_CREAM | RECTAL | Status: DC

## 2014-12-15 MED ORDER — WARFARIN SODIUM 4 MG PO TABS: ORAL_TABLET | ORAL | Status: DC

## 2014-12-15 MED ORDER — BUDESONIDE 180 MCG/ACT IN AEPB: 2.0000 | INHALATION_SPRAY | Freq: Two times a day (BID) | RESPIRATORY_TRACT | Status: DC

## 2014-12-15 MED ORDER — PRAVASTATIN SODIUM 80 MG PO TABS: ORAL_TABLET | ORAL | Status: DC

## 2014-12-15 NOTE — Telephone Encounter (Signed)
Discussed new inhaler with patient. She knows to stop Symbicort and to take Pulmicort instead. Also refilled other medications she needed.

## 2014-12-15 NOTE — Telephone Encounter (Signed)
Pt requesting new inhaler to be filled, but does not know the name. Please call pt back.

## 2014-12-29 ENCOUNTER — Ambulatory Visit (INDEPENDENT_AMBULATORY_CARE_PROVIDER_SITE_OTHER): Payer: Medicare Other | Admitting: Pharmacist

## 2014-12-29 DIAGNOSIS — Z7901 Long term (current) use of anticoagulants: Secondary | ICD-10-CM

## 2014-12-29 DIAGNOSIS — Z86718 Personal history of other venous thrombosis and embolism: Secondary | ICD-10-CM

## 2014-12-29 LAB — POCT INR: INR: 2.2

## 2014-12-29 NOTE — Progress Notes (Signed)
Anti-Coagulation Progress Note  Caroline [name] is a 69 y.o. female who is currently on an anti-coagulation regimen.    RECENT RESULTS: Recent results are below, the most recent result is correlated with a dose of 20 mg. per week: Lab Results  Component Value Date   INR 2.20 12/29/2014   INR 2.20 12/01/2014   INR 3.00 11/03/2014    ANTI-COAG DOSE: Anticoagulation Dose Instructions as of 12/29/2014      [name] Tue Wed Thu Fri Sat   New Dose 4 mg 2 mg 4 mg 2 mg 4 mg 2 mg 4 mg       ANTICOAG SUMMARY: Anticoagulation Episode Summary    Current INR goal    Next INR check 01/19/2015   INR from last check 2.20 (12/29/2014)   Weekly max dose    Target end date    INR check location    Preferred lab    Send INR reminders to       Comments         ANTICOAG TODAY: Anticoagulation Summary as of 12/29/2014    INR goal    Selected INR 2.20 (12/29/2014)   Next INR check 01/19/2015   Target end date     Anticoagulation Episode Summary    INR check location    Preferred lab    Send INR reminders to    Comments       PATIENT INSTRUCTIONS: Patient Instructions  Patient instructed to take medications as defined in the Anti-coagulation Track section of this encounter.  Patient instructed to take today's dose.  Patient verbalized understanding of these instructions.      FOLLOW-UP Return in 3 weeks (on 01/19/2015) for Follow up INR at 1015h.  Hulen Luster, III Pharm.D., CACP

## 2014-12-29 NOTE — Patient Instructions (Signed)
Patient instructed to take medications as defined in the Anti-coagulation Track section of this encounter.  Patient instructed to take today's dose.  Patient verbalized understanding of these instructions.    

## 2014-12-30 NOTE — Progress Notes (Signed)
I have reviewed Dr. Saralyn Pilar note.  Patient is on [facility] for recurrent DVT.  Her INR is at goal.

## 2015-01-06 ENCOUNTER — Other Ambulatory Visit: Payer: Self-pay | Admitting: Internal Medicine

## 2015-01-13 ENCOUNTER — Telehealth: Payer: Self-pay | Admitting: Internal Medicine

## 2015-01-13 NOTE — Telephone Encounter (Signed)
Patient requesting a refill on her Pulmicort.  Patient states she needs an override because she can not afford $150.00

## 2015-01-13 NOTE — Telephone Encounter (Signed)
Pt aware Dr Selena Batten is working on request.

## 2015-01-15 ENCOUNTER — Other Ambulatory Visit (INDEPENDENT_AMBULATORY_CARE_PROVIDER_SITE_OTHER): Payer: Medicare Other

## 2015-01-15 DIAGNOSIS — Z0000 Encounter for general adult medical examination without abnormal findings: Secondary | ICD-10-CM

## 2015-01-15 DIAGNOSIS — Z1211 Encounter for screening for malignant neoplasm of colon: Secondary | ICD-10-CM | POA: Diagnosis not present

## 2015-01-15 LAB — POC HEMOCCULT BLD/STL (HOME/3-CARD/SCREEN)
Card #2 Fecal Occult Blod, POC: NEGATIVE
FECAL OCCULT BLD: NEGATIVE
FECAL OCCULT BLD: NEGATIVE

## 2015-01-19 ENCOUNTER — Ambulatory Visit (INDEPENDENT_AMBULATORY_CARE_PROVIDER_SITE_OTHER): Payer: Medicare Other | Admitting: Pharmacist

## 2015-01-19 DIAGNOSIS — Z7901 Long term (current) use of anticoagulants: Secondary | ICD-10-CM

## 2015-01-19 DIAGNOSIS — I8290 Acute embolism and thrombosis of unspecified vein: Secondary | ICD-10-CM

## 2015-01-19 LAB — POCT INR: INR: 2.9

## 2015-01-19 NOTE — Patient Instructions (Signed)
Patient instructed to take medications as defined in the Anti-coagulation Track section of this encounter.  Patient instructed to take today's dose.  Patient verbalized understanding of these instructions.    

## 2015-01-19 NOTE — Progress Notes (Signed)
I have reviewed Dr. Saralyn Pilar note, Patient is on [facility] for recurrent VTE.  INR high side of normal and coumadin decreased.

## 2015-01-19 NOTE — Progress Notes (Signed)
Anti-Coagulation Progress Note  Caroline [name] is a 70 y.o. female who is currently on an anti-coagulation regimen.    RECENT RESULTS: Recent results are below, the most recent result is correlated with a dose of 22 mg. per week: Lab Results  Component Value Date   INR 2.90 01/19/2015   INR 2.20 12/29/2014   INR 2.20 12/01/2014    ANTI-COAG DOSE: Anticoagulation Dose Instructions as of 01/19/2015      [name] Tue Wed Thu Fri Sat   New Dose 2 mg 2 mg 4 mg 2 mg 4 mg 2 mg 4 mg       ANTICOAG SUMMARY: Anticoagulation Episode Summary    Current INR goal    Next INR check 02/16/2015   INR from last check 2.90 (01/19/2015)   Weekly max dose    Target end date    INR check location    Preferred lab    Send INR reminders to       Comments         ANTICOAG TODAY: Anticoagulation Summary as of 01/19/2015    INR goal    Selected INR 2.90 (01/19/2015)   Next INR check 02/16/2015   Target end date     Anticoagulation Episode Summary    INR check location    Preferred lab    Send INR reminders to    Comments       PATIENT INSTRUCTIONS: Patient Instructions  Patient instructed to take medications as defined in the Anti-coagulation Track section of this encounter.  Patient instructed to take today's dose.  Patient verbalized understanding of these instructions.      FOLLOW-UP Return in 4 weeks (on 02/16/2015) for Follow up INR at 0945h.  Hulen Luster, III Pharm.D., CACP

## 2015-01-30 NOTE — Telephone Encounter (Signed)
Medically-necessary appeal letter sent in collaboration with Ilda Basset. Approval granted by patient insurance, issue resolved.

## 2015-02-02 NOTE — Telephone Encounter (Signed)
Prior auth #[id number] good until 01/10/16 for pt Pulmicort inhaler 

## 2015-02-11 ENCOUNTER — Other Ambulatory Visit: Payer: Self-pay | Admitting: Internal Medicine

## 2015-02-16 ENCOUNTER — Ambulatory Visit (INDEPENDENT_AMBULATORY_CARE_PROVIDER_SITE_OTHER): Payer: Medicare Other | Admitting: Pharmacist

## 2015-02-16 DIAGNOSIS — Z7901 Long term (current) use of anticoagulants: Secondary | ICD-10-CM | POA: Diagnosis not present

## 2015-02-16 DIAGNOSIS — Z86718 Personal history of other venous thrombosis and embolism: Secondary | ICD-10-CM | POA: Diagnosis not present

## 2015-02-16 LAB — POCT INR: INR: 3

## 2015-02-16 NOTE — Patient Instructions (Signed)
Patient instructed to take medications as defined in the Anti-coagulation Track section of this encounter.  Patient instructed to take today's dose.  Patient verbalized understanding of these instructions.    

## 2015-02-16 NOTE — Progress Notes (Signed)
INTERNAL MEDICINE TEACHING ATTENDING ADDENDUM - Ricci Paff M.D  Duration- indefinite, Indication- recurrent DVT, INR- therapeutic. Agree with pharmacy recommendations as outlined in their note.     

## 2015-02-16 NOTE — Progress Notes (Signed)
Anti-Coagulation Progress Note  Caroline [name] is a 70 y.o. female who is currently on an anti-coagulation regimen.    RECENT RESULTS: Recent results are below, the most recent result is correlated with a dose of 20 mg. per week: Lab Results  Component Value Date   INR 3.0 02/16/2015   INR 2.90 01/19/2015   INR 2.20 12/29/2014    ANTI-COAG DOSE: Anticoagulation Dose Instructions as of 02/16/2015      [name] Tue Wed Thu Fri Sat   New Dose 2 mg 2 mg 2 mg 2 mg 4 mg 2 mg 4 mg       ANTICOAG SUMMARY: Anticoagulation Episode Summary    Current INR goal    Next INR check 03/16/2015   INR from last check 3.0 (02/16/2015)   Weekly max dose    Target end date    INR check location    Preferred lab    Send INR reminders to       Comments         ANTICOAG TODAY: Anticoagulation Summary as of 02/16/2015    INR goal    Selected INR 3.0 (02/16/2015)   Next INR check 03/16/2015   Target end date     Anticoagulation Episode Summary    INR check location    Preferred lab    Send INR reminders to    Comments       PATIENT INSTRUCTIONS: Patient Instructions  Patient instructed to take medications as defined in the Anti-coagulation Track section of this encounter.  Patient instructed to take today's dose.  Patient verbalized understanding of these instructions.       FOLLOW-UP Return in 4 weeks (on 03/16/2015) for Follow up INR at 0945h.  Hulen Luster, III Pharm.D., CACP

## 2015-03-16 ENCOUNTER — Ambulatory Visit (INDEPENDENT_AMBULATORY_CARE_PROVIDER_SITE_OTHER): Payer: Medicare Other | Admitting: Pharmacist

## 2015-03-16 DIAGNOSIS — I82409 Acute embolism and thrombosis of unspecified deep veins of unspecified lower extremity: Secondary | ICD-10-CM

## 2015-03-16 DIAGNOSIS — Z7901 Long term (current) use of anticoagulants: Secondary | ICD-10-CM | POA: Diagnosis not present

## 2015-03-16 DIAGNOSIS — I2699 Other pulmonary embolism without acute cor pulmonale: Secondary | ICD-10-CM

## 2015-03-16 LAB — POCT INR: INR: 2.4

## 2015-03-16 NOTE — Progress Notes (Signed)
Anti-Coagulation Progress Note  Caroline [name] is a 70 y.o. female who is currently on an anti-coagulation regimen.    RECENT RESULTS: Recent results are below, the most recent result is correlated with a dose of 18 mg. per week: Lab Results  Component Value Date   INR 2.40 03/16/2015   INR 3.0 02/16/2015   INR 2.90 01/19/2015    ANTI-COAG DOSE: Anticoagulation Dose Instructions as of 03/16/2015      [name] Tue Wed Thu Fri Sat   New Dose 2 mg 4 mg 2 mg 4 mg 2 mg 4 mg 2 mg       ANTICOAG SUMMARY: Anticoagulation Episode Summary    Current INR goal    Next INR check 04/13/2015   INR from last check 2.40 (03/16/2015)   Weekly max dose    Target end date    INR check location    Preferred lab    Send INR reminders to       Comments         ANTICOAG TODAY: Anticoagulation Summary as of 03/16/2015    INR goal    Selected INR 2.40 (03/16/2015)   Next INR check 04/13/2015   Target end date     Anticoagulation Episode Summary    INR check location    Preferred lab    Send INR reminders to    Comments       PATIENT INSTRUCTIONS: Patient Instructions  Patient instructed to take medications as defined in the Anti-coagulation Track section of this encounter.  Patient instructed to take today's dose.  Patient verbalized understanding of these instructions.       FOLLOW-UP Return in 4 weeks (on 04/13/2015) for Follow up INR at 1000h.  Hulen Luster, III Pharm.D., CACP

## 2015-03-16 NOTE — Progress Notes (Signed)
INTERNAL MEDICINE TEACHING ATTENDING ADDENDUM - Earl Lagos M.D  Duration- indefinite, Indication- unprovoked PE, DVT, INR- therapeutic. Agree with pharmacy recommendations as outlined in their note.

## 2015-03-16 NOTE — Patient Instructions (Signed)
Patient instructed to take medications as defined in the Anti-coagulation Track section of this encounter.  Patient instructed to take today's dose.  Patient verbalized understanding of these instructions.    

## 2015-03-20 ENCOUNTER — Encounter (HOSPITAL_COMMUNITY): Payer: Self-pay | Admitting: Vascular Surgery

## 2015-03-20 ENCOUNTER — Emergency Department (HOSPITAL_COMMUNITY)
Admission: EM | Admit: 2015-03-20 | Discharge: 2015-03-21 | Disposition: A | Discharge status: Home / Self Care | Payer: Medicare Other | Attending: Emergency Medicine | Admitting: Emergency Medicine

## 2015-03-20 ENCOUNTER — Emergency Department (HOSPITAL_COMMUNITY): Payer: Medicare Other

## 2015-03-20 DIAGNOSIS — Z87891 Personal history of nicotine dependence: Secondary | ICD-10-CM | POA: Insufficient documentation

## 2015-03-20 DIAGNOSIS — Z86711 Personal history of pulmonary embolism: Secondary | ICD-10-CM | POA: Insufficient documentation

## 2015-03-20 DIAGNOSIS — I10 Essential (primary) hypertension: Secondary | ICD-10-CM | POA: Insufficient documentation

## 2015-03-20 DIAGNOSIS — Z880 Allergy status to penicillin: Secondary | ICD-10-CM | POA: Diagnosis not present

## 2015-03-20 DIAGNOSIS — Z7901 Long term (current) use of anticoagulants: Secondary | ICD-10-CM | POA: Insufficient documentation

## 2015-03-20 DIAGNOSIS — R05 Cough: Secondary | ICD-10-CM | POA: Diagnosis not present

## 2015-03-20 DIAGNOSIS — E119 Type 2 diabetes mellitus without complications: Secondary | ICD-10-CM | POA: Insufficient documentation

## 2015-03-20 DIAGNOSIS — Z86718 Personal history of other venous thrombosis and embolism: Secondary | ICD-10-CM | POA: Insufficient documentation

## 2015-03-20 DIAGNOSIS — J45901 Unspecified asthma with (acute) exacerbation: Secondary | ICD-10-CM | POA: Diagnosis not present

## 2015-03-20 DIAGNOSIS — Z79899 Other long term (current) drug therapy: Secondary | ICD-10-CM | POA: Diagnosis not present

## 2015-03-20 LAB — COMPREHENSIVE METABOLIC PANEL
ALBUMIN: 3.4 g/dL — AB (ref 3.5–5.0)
ALT: 33 U/L (ref 14–54)
ANION GAP: 12 (ref 5–15)
AST: 38 U/L (ref 15–41)
Alkaline Phosphatase: 71 U/L (ref 38–126)
BILIRUBIN TOTAL: 0.4 mg/dL (ref 0.3–1.2)
BUN: 7 mg/dL (ref 6–20)
CO2: 28 mmol/L (ref 22–32)
Calcium: 9 mg/dL (ref 8.9–10.3)
Chloride: 102 mmol/L (ref 101–111)
Creatinine, Ser: 0.87 mg/dL (ref 0.44–1.00)
GFR calc Af Amer: >60 mL/min (ref >60)
GFR calc non Af Amer: >60 mL/min (ref >60)
GLUCOSE: 135 mg/dL — AB (ref 65–99)
Potassium: 3.2 mmol/L — ABNORMAL LOW (ref 3.5–5.1)
SODIUM: 142 mmol/L (ref 135–145)
TOTAL PROTEIN: 7.2 g/dL (ref 6.5–8.1)

## 2015-03-20 LAB — CBC WITH DIFFERENTIAL/PLATELET
BASOS PCT: 0 %
Basophils Absolute: 0 10*3/uL (ref 0.0–0.1)
Eosinophils Absolute: 0.1 10*3/uL (ref 0.0–0.7)
Eosinophils Relative: 2 %
HCT: 36.1 % (ref 36.0–46.0)
Hemoglobin: 11.4 g/dL — ABNORMAL LOW (ref 12.0–15.0)
LYMPHS ABS: 1.4 10*3/uL (ref 0.7–4.0)
LYMPHS PCT: 21 %
MCH: 24.9 pg — ABNORMAL LOW (ref 26.0–34.0)
MCHC: 31.6 g/dL (ref 30.0–36.0)
MCV: 78.8 fL (ref 78.0–100.0)
MONO ABS: 0.8 10*3/uL (ref 0.1–1.0)
MONOS PCT: 12 %
NEUTROS ABS: 4.3 10*3/uL (ref 1.7–7.7)
NEUTROS PCT: 65 %
PLATELETS: 167 10*3/uL (ref 150–400)
RBC: 4.58 MIL/uL (ref 3.87–5.11)
RDW: 14.5 % (ref 11.5–15.5)
WBC: 6.7 10*3/uL (ref 4.0–10.5)

## 2015-03-20 LAB — I-STAT CG4 LACTIC ACID, ED: Lactic Acid, Venous: 1.87 mmol/L (ref 0.5–2.0)

## 2015-03-20 MED ORDER — AZITHROMYCIN 250 MG PO TABS: 250.0000 mg | ORAL_TABLET | Freq: Every day | ORAL | Status: DC

## 2015-03-20 MED ORDER — PREDNISONE 20 MG PO TABS
60.0000 mg | ORAL_TABLET | Freq: Once | ORAL | Status: AC
  Administered 2015-03-21: 60 mg via ORAL
  Filled 2015-03-20: qty 3

## 2015-03-20 MED ORDER — PREDNISONE 10 MG PO TABS: 60.0000 mg | ORAL_TABLET | Freq: Every day | ORAL | Status: DC

## 2015-03-20 NOTE — ED Provider Notes (Signed)
CSN: [id number]     Arrival date & time 03/20/15  1920 History  By signing my name below, I, Doreatha Martin, attest that this documentation has been prepared under the direction and in the presence of Azalia Bilis, MD. Electronically Signed: Doreatha Martin, ED Scribe. 03/20/2015. 11:48 PM.    Chief Complaint  Patient presents with  . Cough   The history is provided by the patient. No language interpreter was used.    HPI Comments: Caroline [name] is a 70 y.o. female with h/o DM, HTN, DVT, PE who presents to the Emergency Department complaining of moderate, intermittent episodes of productive cough with yellow sputum onset last week and worsened 3 days ago. Pt states associated fever, HA, sore throat, chest tenderness secondary to coughing. Pt states she has taken advil with no relief of sore throat. She also states she has been using Symbicort and Pulmicort with some relief of cough. Pt notes that coughing worsens her sore throat. PCP is Dr. Beckie Salts. She denies SOB.    Past Medical History  Diagnosis Date  . Hypertension   . Diabetes mellitus   . Vertigo   . DVT, lower extremity, recurrent (HCC)     Patient had unprovoked PE on 2002 and DVT in right lower extremety 2008.  . PE (pulmonary embolism)     Patient had unprovoked PE on 2002   History reviewed. No pertinent past surgical history. No family history on file. Social History  Substance Use Topics  . Smoking status: Former Games developer  . Smokeless tobacco: None  . Alcohol Use: No   OB History    No data available     Review of Systems A complete 10 system review of systems was obtained and all systems are negative except as noted in the HPI and PMH.    Allergies  Penicillins; Shellfish allergy; and Tomato  Home Medications   Prior to Admission medications   Medication Sig Start Date End Date Taking? Authorizing Provider  acetaminophen (TYLENOL) 500 MG tablet Take 500 mg by mouth every 6 (six) hours as needed for mild pain or  moderate pain. pain    Historical Provider, MD  budesonide (PULMICORT) 180 MCG/ACT inhaler Inhale 2 puffs into the lungs 2 (two) times daily. 12/15/14   Su Hoff, MD  hydrochlorothiazide (HYDRODIURIL) 25 MG tablet Take 1 tablet (25 mg total) by mouth daily. 12/15/14   Carly Arlyce Harman, MD  hydrocortisone (PROCTOSOL HC) 2.5 % rectal cream PLACE 1 APPLICATION RECTALLY 2 (TWO) TIMES DAILY. 12/15/14   Carly Arlyce Harman, MD  meclizine (ANTIVERT) 25 MG tablet TAKE 1 TABLET BY MOUTH 3 TIMES A DAY AS NEEDED 12/11/14   Su Hoff, MD  metoprolol tartrate (LOPRESSOR) 25 MG tablet TAKE 1 TABLET (25 MG TOTAL) BY MOUTH 2 (TWO) TIMES DAILY. 11/24/14   Carly Arlyce Harman, MD  montelukast (SINGULAIR) 10 MG tablet Take 1 tablet (10 mg total) by mouth at bedtime. 12/15/14   Carly J Rivet, MD  montelukast (SINGULAIR) 10 MG tablet TAKE 1 TABLET (10 MG TOTAL) BY MOUTH AT BEDTIME. 01/06/15   Carly J Rivet, MD  omeprazole (PRILOSEC) 20 MG capsule TAKE 1 CAPSULE (20 MG TOTAL) BY MOUTH DAILY. Patient taking differently: TAKE 1 CAPSULE (20 MG TOTAL) BY MOUTH AS NEEDED 11/28/13   Carly J Rivet, MD  pravastatin (PRAVACHOL) 80 MG tablet TAKE 1 TABLET (80 MG TOTAL) BY MOUTH DAILY. 12/15/14   Su Hoff, MD  PROAIR HFA 108 (90 BASE) MCG/ACT inhaler  INHALE 2 PUFFS INTO THE LUNGS EVERY 6 (SIX) HOURS AS NEEDED FOR WHEEZING OR SHORTNESS OF BREATH. 11/24/14   Su Hoff, MD  SYMBICORT 80-4.5 MCG/ACT inhaler USE 2 SPRAYS TWICE DAILY 02/12/15   Su Hoff, MD  warfarin (COUMADIN) 4 MG tablet TAKE ACCORDING TO DIRECTIONS 12/15/14   Su Hoff, MD  warfarin (COUMADIN) 4 MG tablet Take 1/2 tablet on Mondays/Wednesdays/Fridays; Take 1 tablet all other days. 12/15/14   Carly J Rivet, MD   BP 135/69 mmHg  Pulse 94  Temp(Src) 99.8 F (37.7 C) (Oral)  Resp 16  SpO2 97% Physical Exam  Constitutional: She is oriented to person, place, and time. She appears well-developed and well-nourished. No distress.  HENT:  Head: Normocephalic and  atraumatic.  Mouth/Throat: Uvula is midline, oropharynx is clear and moist and mucous membranes are normal. No oropharyngeal exudate, posterior oropharyngeal edema, posterior oropharyngeal erythema or tonsillar abscesses.  Eyes: EOM are normal.  Neck: Normal range of motion.  Cardiovascular: Normal rate, regular rhythm and normal heart sounds.  Exam reveals no gallop and no friction rub.   No murmur heard. Pulmonary/Chest: Effort normal and breath sounds normal. No respiratory distress. She has no wheezes. She has no rales.  Abdominal: Soft. She exhibits no distension. There is no tenderness.  Musculoskeletal: Normal range of motion.  Neurological: She is alert and oriented to person, place, and time.  Skin: Skin is warm and dry.  Psychiatric: She has a normal mood and affect. Judgment normal.  Nursing note and vitals reviewed.   ED Course  Procedures (including critical care time) DIAGNOSTIC STUDIES: Oxygen Saturation is 97% on RA, normal by my interpretation.    COORDINATION OF CARE: 11:47 PM Discussed treatment plan with pt at bedside which includes steroids, antibiotics and pt agreed to plan.   Labs Review Labs Reviewed  COMPREHENSIVE METABOLIC PANEL - Abnormal; Notable for the following:    Potassium 3.2 (*)    Glucose, Bld 135 (*)    Albumin 3.4 (*)    All other components within normal limits  CBC WITH DIFFERENTIAL/PLATELET - Abnormal; Notable for the following:    Hemoglobin 11.4 (*)    MCH 24.9 (*)    All other components within normal limits  I-STAT CG4 LACTIC ACID, ED  I-STAT CG4 LACTIC ACID, ED    Imaging Review Dg Chest 2 View  03/20/2015  CLINICAL DATA:  Productive yellow cough with chest wall pain. EXAM: CHEST  2 VIEW COMPARISON:  07/01/2014 FINDINGS: The lungs are clear wiithout focal pneumonia, edema, pneumothorax or pleural effusion. Bibasilar subsegmental atelectasis or linear scarring is stable Cardiopericardial silhouette is at upper limits of normal for  size. The visualized bony structures of the thorax are intact. IMPRESSION: Stable.  No acute findings. Electronically Signed   By: Kennith Center M.D.   On: 03/20/2015 20:20   I have personally reviewed and evaluated these images and lab results as part of my medical decision-making.   EKG Interpretation   Date/Time:  Friday March 20 2015 19:31:40 EST Ventricular Rate:  99 PR Interval:  162 QRS Duration: 138 QT Interval:  380 QTC Calculation: 487 R Axis:   -72 Text Interpretation:  Normal sinus rhythm Right bundle branch block Left  anterior fascicular block ** Bifascicular block ** Minimal voltage  criteria for LVH, may be normal variant Abnormal ECG No significant change  was found Confirmed by Sylvia Helms  MD, Tyric Rodeheaver ([id number]) on 03/20/2015 11:35:41 PM      MDM  Final diagnoses:  Asthma exacerbation   Patient is overall well-appearing.  Likely mild asthma exacerbation.  Home with steroids and instructions for every 4 hours rhonchi dilators.  She was offered a bronchodilator in the emergency department she states that she has her handheld will take 2 puffs of that now.  She understands to return to the emergency department for new or worsening symptoms   Medications  predniSONE (DELTASONE) tablet 60 mg (60 mg Oral Given 03/21/15 0007)      I personally performed the services described in this documentation, which was scribed in my presence. The recorded information has been reviewed and is accurate.       Azalia Bilis, MD 03/22/15 0130

## 2015-03-20 NOTE — ED Notes (Signed)
Pt reports to the ED for eval of productive yellow cough and chest wall pain. Cough started on Monday. She has also been having chills and body aches. Pt denies any N/V. Reports some diarrhea yesterday. Pt A&Ox4, resp e/u, and skin warm and dry

## 2015-03-20 NOTE — Discharge Instructions (Signed)

## 2015-03-21 DIAGNOSIS — J45901 Unspecified asthma with (acute) exacerbation: Secondary | ICD-10-CM | POA: Diagnosis not present

## 2015-03-21 NOTE — ED Notes (Signed)
Pt stable, ambulatory, states understanding of discharge instructions 

## 2015-03-23 ENCOUNTER — Telehealth: Payer: Self-pay | Admitting: Internal Medicine

## 2015-03-23 ENCOUNTER — Telehealth: Payer: Self-pay | Admitting: Pharmacist

## 2015-03-23 NOTE — Telephone Encounter (Signed)
Triage Nurse received call from patient who has been prescribed a Z-pak and a 6 day burst of oral prednisone. Asking about any requirement for dose adjustment. See notes. Azithromycin--of all the antibiotics typically interacts the least with warfarin. Warfarin + prednisone may increase INR. Short burst of therapy has been prescribed. Will empirically omit ONE dose of warfarin today. Keep scheduled appointment.

## 2015-03-23 NOTE — Telephone Encounter (Signed)
Pt calling has some questions about her INR.

## 2015-03-23 NOTE — Telephone Encounter (Signed)
Pt seen in ED Friday, given z-pak and prednisone and would like to know if she needs to adjust her warfarin at all with these medications.

## 2015-03-23 NOTE — Telephone Encounter (Signed)
Per Dr. Alexandria Lodge advised pt to hold 1 day of coumadin and follow-up on next scheduled coumadin clinic.  Pt understands instruction

## 2015-04-02 ENCOUNTER — Telehealth: Payer: Self-pay | Admitting: Internal Medicine

## 2015-04-02 NOTE — Telephone Encounter (Signed)
INTERNAL MEDICINE RESIDENCY PROGRAM After-Hours Telephone Call    Reason for call:   I received a call from [name] on 04/02/2015 at 6:29 PM indicating she is having persistent cough and would like to know what she should take that will not interact w/ her medications.    Pertinent Data:   Cough productive of yellow sputum w/ occasional brown streaked sputum. Cough is keeping her up at night and causing her head to hurt  Recently treated for asthma exacerbation on 3/10 in the ED w/ steroids and zpack   Pt in the end said she wanted to be seen in clinic tomorrow at 11:00am. Reviewed clinic schedule and there are morning openings.     Assessment / Plan / Recommendations:   Pt states she wanted to see Dr. Beckie [name] in clinic at 11:00am however she is not in clinic. Informed patient to call clinic tomorrow morning for an appt, will also send front desk a message to call pt tomorrow.   As always, pt is advised that if symptoms worsen or new symptoms arise, they should go to an urgent care facility or to to ER for further evaluation.    Caroline Brick, MD   04/02/2015, 6:29 PM

## 2015-04-03 ENCOUNTER — Encounter: Payer: Self-pay | Admitting: Internal Medicine

## 2015-04-03 ENCOUNTER — Ambulatory Visit (INDEPENDENT_AMBULATORY_CARE_PROVIDER_SITE_OTHER): Payer: Medicare Other | Admitting: Internal Medicine

## 2015-04-03 VITALS — BP 136/57 | HR 88 | Temp 98.2°F | Ht 63.0 in | Wt 280.0 lb

## 2015-04-03 DIAGNOSIS — R058 Other specified cough: Secondary | ICD-10-CM

## 2015-04-03 DIAGNOSIS — R05 Cough: Secondary | ICD-10-CM | POA: Insufficient documentation

## 2015-04-03 MED ORDER — GUAIFENESIN-DM 100-10 MG/5ML PO SYRP: 5.0000 mL | ORAL_SOLUTION | ORAL | Status: DC | PRN

## 2015-04-03 NOTE — Patient Instructions (Addendum)
Caroline [name],  It was a joy meeting you today.  It can take up to 6 weeks to get over a cough after you've had a cold. This is normal. To treat your cough, we are prescribing Robitussin cough syrup. This should help you sleep through the night. If this is not working, we can try a different cough syrup and check with Dr. Shela Commons if its appropriate given your warfarin.  I have checked to see if there was any interaction with your Warfarin, and I could not find any.  Continue to drink plenty of fluids.  Cough, Adult A cough helps to clear your throat and lungs. A cough may last only 2-3 weeks (acute), or it may last longer than 8 weeks (chronic). Many different things can cause a cough. A cough may be a sign of an illness or another medical condition. HOME CARE  Pay attention to any changes in your cough.  Take medicines only as told by your doctor.  If you were prescribed an antibiotic medicine, take it as told by your doctor. Do not stop taking it even if you start to feel better.  Talk with your doctor before you try using a cough medicine.  Drink enough fluid to keep your pee (urine) clear or pale yellow.  If the air is dry, use a cold steam vaporizer or humidifier in your home.  Stay away from things that make you cough at work or at home.  If your cough is worse at night, try using extra pillows to raise your head up higher while you sleep.  Do not smoke, and try not to be around smoke. If you need help quitting, ask your doctor.  Do not have caffeine.  Do not drink alcohol.  Rest as needed. GET HELP IF:  You have new problems (symptoms).  You cough up yellow fluid (pus).  Your cough does not get better after 2-3 weeks, or your cough gets worse.  Medicine does not help your cough and you are not sleeping well.  You have pain that gets worse or pain that is not helped with medicine.  You have a fever.  You are losing weight and you do not know why.  You have night  sweats. GET HELP RIGHT AWAY IF:  You cough up blood.  You have trouble breathing.  Your heartbeat is very fast.   This information is not intended to replace advice given to you by your health care provider. Make sure you discuss any questions you have with your health care provider.   Document Released: 09/09/2010 Document Revised: 09/17/2014 Document Reviewed: 03/05/2014 Elsevier Interactive Patient Education [employer]

## 2015-04-03 NOTE — Progress Notes (Signed)
   Subjective:    Patient ID: Caroline [name], female    DOB: 1945/01/24, 70 y.o.   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 She reports that her only remaining symptoms are a productive cough with light brown or white sputum, rib pain with coughing, insomnia from nighttime cough, and post-tussive headache. When she was seen in the ED three weeks ago, she was given Z-pak and prednisone, which she finished. The cough has made her feel so badShe denies dyspnea, fevers, headache, chest pain, calf pain or swelling, syncope, and sense of impending doom. She reports that [employer] make her dizzy.  Review of Systems  Constitutional: Negative for fever and chills.  HENT: Negative for congestion and sore throat.   Eyes: Negative for itching.  Respiratory: Positive for cough. Negative for shortness of breath and wheezing.   Cardiovascular: Negative for chest pain.  Gastrointestinal: Negative for nausea and diarrhea.  Musculoskeletal: Negative for myalgias.  Neurological: Positive for headaches. Negative for weakness.       Objective:   Physical Exam  Constitutional: She appears well-developed and well-nourished. No distress.  HENT:  Mouth/Throat: Oropharynx is clear and moist. No oropharyngeal exudate.  Eyes: Conjunctivae are normal. No scleral icterus.  Neck: Neck supple.  Cardiovascular: Normal rate, regular rhythm and normal heart sounds.   Pulmonary/Chest: Effort normal and breath sounds normal. No respiratory distress. She has no wheezes. She has no rales.  Abdominal: Soft. Bowel sounds are normal. She exhibits no distension. There is no tenderness.  Musculoskeletal: She exhibits no edema or tenderness.  Lymphadenopathy:    She has no cervical adenopathy.  Neurological: She is alert. She has normal reflexes.    Skin: Skin is warm and dry. She is not diaphoretic.  Psychiatric: She has a normal mood and affect. Her behavior is normal.  Vitals reviewed.         Assessment & Plan:   Please see problem based assessment and plan for details.

## 2015-04-03 NOTE — Assessment & Plan Note (Signed)
A: This likely represents a post-viral cough. Her other URI symptoms have resolved. I informed her that it may take up to 3 more weeks for her cough symptoms to resolve. I provided her instructions for returning to the clinic or going to the ED. I will provide cough medicine and advice for supportive measures. I have reviewed medication interactions with the clinic pharmacist and DXM-guaifenesin should not interact with her warfarin.  P: Guafenesin-DXM syrup

## 2015-04-07 NOTE — Progress Notes (Signed)
Internal Medicine Clinic Attending  Case discussed with Dr. Ford at the time of the visit.  We reviewed the resident's history and exam and pertinent patient test results.  I agree with the assessment, diagnosis, and plan of care documented in the resident's note.  

## 2015-04-09 ENCOUNTER — Other Ambulatory Visit: Payer: Self-pay | Admitting: Internal Medicine

## 2015-04-13 ENCOUNTER — Ambulatory Visit (INDEPENDENT_AMBULATORY_CARE_PROVIDER_SITE_OTHER): Payer: Medicare Other | Admitting: Pharmacist

## 2015-04-13 DIAGNOSIS — Z86718 Personal history of other venous thrombosis and embolism: Secondary | ICD-10-CM

## 2015-04-13 DIAGNOSIS — Z7901 Long term (current) use of anticoagulants: Secondary | ICD-10-CM | POA: Diagnosis not present

## 2015-04-13 LAB — POCT INR: INR: 2.7

## 2015-04-13 NOTE — Patient Instructions (Signed)
Patient instructed to take medications as defined in the Anti-coagulation Track section of this encounter.  Patient instructed to take today's dose.  Patient verbalized understanding of these instructions.    

## 2015-04-13 NOTE — Progress Notes (Signed)
Anti-Coagulation Progress Note  Caroline [name] is a 70 y.o. female who is currently on an anti-coagulation regimen.    RECENT RESULTS: Recent results are below, the most recent result is correlated with a dose of 20 mg. per week: Lab Results  Component Value Date   INR 2.70 04/13/2015   INR 2.40 03/16/2015   INR 3.0 02/16/2015    ANTI-COAG DOSE: Anticoagulation Dose Instructions as of 04/13/2015      [name] Tue Wed Thu Fri Sat   New Dose 2 mg 4 mg 2 mg 4 mg 2 mg 4 mg 2 mg       ANTICOAG SUMMARY: Anticoagulation Episode Summary    Current INR goal    Next INR check 05/11/2015   INR from last check 2.70 (04/13/2015)   Weekly max dose    Target end date    INR check location    Preferred lab    Send INR reminders to       Comments         ANTICOAG TODAY: Anticoagulation Summary as of 04/13/2015    INR goal    Selected INR 2.70 (04/13/2015)   Next INR check 05/11/2015   Target end date     Anticoagulation Episode Summary    INR check location    Preferred lab    Send INR reminders to    Comments       PATIENT INSTRUCTIONS: Patient Instructions  Patient instructed to take medications as defined in the Anti-coagulation Track section of this encounter.  Patient instructed to take today's dose.  Patient verbalized understanding of these instructions.       FOLLOW-UP Return in 4 weeks (on 05/11/2015) for Follow up INR at 1000h.  Hulen Luster, III Pharm.D., CACP

## 2015-04-13 NOTE — Progress Notes (Signed)
INTERNAL MEDICINE TEACHING ATTENDING ADDENDUM - Santia Labate M.D  Duration- indefinite, Indication- recurrent VTE, INR- therapeutic. Agree with pharmacy recommendations as outlined in their note.   

## 2015-05-05 ENCOUNTER — Encounter: Payer: Self-pay | Admitting: Internal Medicine

## 2015-05-05 ENCOUNTER — Ambulatory Visit (INDEPENDENT_AMBULATORY_CARE_PROVIDER_SITE_OTHER): Payer: Medicare Other | Admitting: Internal Medicine

## 2015-05-05 VITALS — BP 162/76 | HR 83 | Temp 98.2°F | Ht 63.5 in | Wt 280.5 lb

## 2015-05-05 DIAGNOSIS — Z7901 Long term (current) use of anticoagulants: Secondary | ICD-10-CM

## 2015-05-05 DIAGNOSIS — J4530 Mild persistent asthma, uncomplicated: Secondary | ICD-10-CM

## 2015-05-05 DIAGNOSIS — I82501 Chronic embolism and thrombosis of unspecified deep veins of right lower extremity: Secondary | ICD-10-CM | POA: Diagnosis not present

## 2015-05-05 DIAGNOSIS — Z0000 Encounter for general adult medical examination without abnormal findings: Secondary | ICD-10-CM

## 2015-05-05 DIAGNOSIS — Z7951 Long term (current) use of inhaled steroids: Secondary | ICD-10-CM

## 2015-05-05 DIAGNOSIS — I82409 Acute embolism and thrombosis of unspecified deep veins of unspecified lower extremity: Secondary | ICD-10-CM

## 2015-05-05 DIAGNOSIS — Z86711 Personal history of pulmonary embolism: Secondary | ICD-10-CM

## 2015-05-05 DIAGNOSIS — Z79899 Other long term (current) drug therapy: Secondary | ICD-10-CM

## 2015-05-05 DIAGNOSIS — I10 Essential (primary) hypertension: Secondary | ICD-10-CM

## 2015-05-05 DIAGNOSIS — R42 Dizziness and giddiness: Secondary | ICD-10-CM

## 2015-05-05 MED ORDER — CALCIUM CITRATE-VITAMIN D 315-200 MG-UNIT PO TABS: 2.0000 | ORAL_TABLET | Freq: Every day | ORAL | Status: DC

## 2015-05-05 MED ORDER — PRAVASTATIN SODIUM 80 MG PO TABS: ORAL_TABLET | ORAL | Status: DC

## 2015-05-05 MED ORDER — MECLIZINE HCL 25 MG PO TABS: 25.0000 mg | ORAL_TABLET | Freq: Three times a day (TID) | ORAL | Status: DC | PRN

## 2015-05-05 MED ORDER — HYDROCHLOROTHIAZIDE 25 MG PO TABS: 25.0000 mg | ORAL_TABLET | Freq: Every day | ORAL | Status: DC

## 2015-05-05 MED ORDER — WARFARIN SODIUM 4 MG PO TABS: ORAL_TABLET | ORAL | Status: DC

## 2015-05-05 NOTE — Assessment & Plan Note (Signed)
Her asthma seems to be well controlled with only cough occurring about once a week. Will continue current medications of Symbicort, Singulair, and Albuterol PRN. Her insurance did not cover Pulmicort so switched back to Symbicort.

## 2015-05-05 NOTE — Patient Instructions (Signed)
It was nice to see you today, Caroline [name]!  - Start taking calcium-vitamin D 2 pills once daily - Continue doing a great job with your other medications - We won't make any changes to your blood pressure medications now   General Instructions:   Please bring your medicines with you each time you come to clinic.  Medicines may include prescription medications, over-the-counter medications, herbal remedies, eye drops, vitamins, or other pills.   Progress Toward Treatment Goals:  Treatment Goal 11/03/2014  Blood pressure unable to assess    Self Care Goals & Plans:  Self Care Goal 05/05/2015  Manage my medications take my medicines as prescribed; bring my medications to every visit; refill my medications on time  Eat healthy foods eat more vegetables; eat foods that are low in salt; eat baked foods instead of fried foods  Be physically active find an activity I enjoy    No flowsheet data found.   Care Management & Community Referrals:  Referral 10/30/2012  Referrals made for care management support none needed

## 2015-05-05 NOTE — Assessment & Plan Note (Signed)
Continue Coumadin. In therapeutic range.

## 2015-05-05 NOTE — Assessment & Plan Note (Signed)
Unable to do DEXA due to claustrophobia and unable to lie flat. Will preemptively start her on calcium and vitamin D supplementation.

## 2015-05-05 NOTE — Assessment & Plan Note (Signed)
BP mildly elevated today at 162/76. She has been well controlled in the past. She notes her grandson was admitted to the hospital today so could be elevated secondary to emotional stress. Recheck BP was 153/80. I will not make any medication changes at this time. Patient asked if she could get a home BP cuff to monitor her blood pressures and I do not think this is unreasonable. She has agreed to check her blood pressure daily in the morning for the next few weeks and to call me with the results. - Continue HCTZ 25 mg daily - Continue Lopressor 25 mg BID - Check bmet next visit - Order placed for home BP machine

## 2015-05-05 NOTE — Progress Notes (Signed)
   Subjective:    Patient ID: Caroline [name], female    DOB: 01-11-1946, 70 y.o.   MRN: [id number]  HPI Caroline [name] is a 70yo woman with PMHx of HTN, DVT/PE on coumadin, and asthma who presents today for follow up on her hypertension.   HTN: BP mildly elevated at 162/76 today. She reports she is stressed because her grandson was just admitted to the hospital today. She has been taking her HCTZ 25 mg daily and Lopressor 25 mg BID.   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 She is currently on coumadin. Last INR 2.7 on 04/13/15. She denies any SOB, chest pain, leg swelling or tenderness.   Asthma: Reports her asthma has been acting up slightly with the pollen. She reports a cough at nighttime about once per week, but denies SOB, wheezing. She is taking Symbicort 2 sprays BID, Singulair 10 mg daily, and albuterol as needed. She reports not needing her albuterol recently.   Vertigo: She notes she is still getting episodes of vertigo occasionally. She states the Meclizine helps her symptoms. She denies any falls or near falls. She walks with a cane.     Review of Systems General: Denies fever, chills, night sweats, changes in weight, changes in appetite HEENT: Denies headaches, ear pain, changes in vision, rhinorrhea, sore throat CV: Denies CP, palpitations, SOB, orthopnea Pulm: Denies SOB GI: Denies abdominal pain, nausea, vomiting, diarrhea, constipation, melena, hematochezia GU: Denies dysuria, hematuria, frequency Msk: Denies muscle cramps, joint pains Neuro: Denies weakness, numbness, tingling Skin: Denies rashes, bruising Psych: Denies depression, anxiety, hallucinations    Objective:   Physical Exam General: obese woman sitting up, pleasant, NAD HEENT: [place]/AT, EOMI, sclera anicteric, mucus membranes moist CV: RRR, no m/g/r Pulm: CTA bilaterally, breaths non-labored Abd: BS+, soft, obese, non-tender Ext: warm, no peripheral edema Neuro: alert and oriented x 3, no focal  deficits     Assessment & Plan:  Please refer to A&P documentation.

## 2015-05-05 NOTE — Assessment & Plan Note (Signed)
Continue Meclizine as this controls her symptoms.

## 2015-05-07 NOTE — Progress Notes (Signed)
Internal Medicine Clinic Attending  Case discussed with Dr. Rivet soon after the resident saw the patient.  We reviewed the resident's history and exam and pertinent patient test results.  I agree with the assessment, diagnosis, and plan of care documented in the resident's note.  

## 2015-05-11 ENCOUNTER — Ambulatory Visit (INDEPENDENT_AMBULATORY_CARE_PROVIDER_SITE_OTHER): Payer: Medicare Other | Admitting: Pharmacist

## 2015-05-11 DIAGNOSIS — Z7901 Long term (current) use of anticoagulants: Secondary | ICD-10-CM | POA: Diagnosis not present

## 2015-05-11 DIAGNOSIS — I82509 Chronic embolism and thrombosis of unspecified deep veins of unspecified lower extremity: Secondary | ICD-10-CM | POA: Diagnosis not present

## 2015-05-11 LAB — POCT INR: INR: 2.1

## 2015-05-11 NOTE — Patient Instructions (Signed)
Patient instructed to take medications as defined in the Anti-coagulation Track section of this encounter.  Patient instructed to take today's dose.  Patient verbalized understanding of these instructions.    

## 2015-05-11 NOTE — Progress Notes (Signed)
INTERNAL MEDICINE TEACHING ATTENDING ADDENDUM - Esvin Hnat M.D  Duration- indefinite, Indication- recurrent DVT, INR- therapeutic. Agree with pharmacy recommendations as outlined in their note.     

## 2015-05-11 NOTE — Progress Notes (Signed)
Anti-Coagulation Progress Note  Caroline [name] is a 70 y.o. female who is currently on an anti-coagulation regimen.    RECENT RESULTS: Recent results are below, the most recent result is correlated with a dose of 20 mg. per week: Lab Results  Component Value Date   INR 2.10 05/11/2015   INR 2.70 04/13/2015   INR 2.40 03/16/2015    ANTI-COAG DOSE: Anticoagulation Dose Instructions as of 05/11/2015      [name] Tue Wed Thu Fri Sat   New Dose 2 mg 4 mg 4 mg 4 mg 2 mg 4 mg 2 mg       ANTICOAG SUMMARY: Anticoagulation Episode Summary    Current INR goal    Next INR check 06/01/2015   INR from last check 2.10 (05/11/2015)   Weekly max dose    Target end date    INR check location    Preferred lab    Send INR reminders to       Comments         ANTICOAG TODAY: Anticoagulation Summary as of 05/11/2015    INR goal    Selected INR 2.10 (05/11/2015)   Next INR check 06/01/2015   Target end date     Anticoagulation Episode Summary    INR check location    Preferred lab    Send INR reminders to    Comments       PATIENT INSTRUCTIONS: Patient Instructions  Patient instructed to take medications as defined in the Anti-coagulation Track section of this encounter.  Patient instructed to take today's dose.  Patient verbalized understanding of these instructions.       FOLLOW-UP Return in 3 weeks (on 06/01/2015) for Follow up INR at 0945h.  Hulen Luster, III Pharm.D., CACP

## 2015-06-01 ENCOUNTER — Ambulatory Visit (INDEPENDENT_AMBULATORY_CARE_PROVIDER_SITE_OTHER): Payer: Medicare Other | Admitting: Pharmacist

## 2015-06-01 DIAGNOSIS — Z7901 Long term (current) use of anticoagulants: Secondary | ICD-10-CM | POA: Diagnosis not present

## 2015-06-01 DIAGNOSIS — Z86718 Personal history of other venous thrombosis and embolism: Secondary | ICD-10-CM | POA: Diagnosis not present

## 2015-06-01 LAB — POCT INR: INR: 2.3

## 2015-06-01 NOTE — Progress Notes (Signed)
INTERNAL MEDICINE TEACHING ATTENDING ADDENDUM - Caroline [name] M.D  Duration- indefinite, Indication- PE, DVT, INR- therapeutic. Agree with pharmacy recommendations as outlined in their note.      

## 2015-06-01 NOTE — Progress Notes (Signed)
Anti-Coagulation Progress Note  Caroline [name] is a 70 y.o. female who is currently on an anti-coagulation regimen.    RECENT RESULTS: Recent results are below, the most recent result is correlated with a dose of 22 mg. per week: Lab Results  Component Value Date   INR 2.30 06/01/2015   INR 2.10 05/11/2015   INR 2.70 04/13/2015    ANTI-COAG DOSE: Anticoagulation Dose Instructions as of 06/01/2015      [name] Tue Wed Thu Fri Sat   New Dose 2 mg 4 mg 4 mg 4 mg 2 mg 4 mg 2 mg       ANTICOAG SUMMARY: Anticoagulation Episode Summary    Current INR goal    Next INR check 06/29/2015   INR from last check 2.30 (06/01/2015)   Weekly max dose    Target end date    INR check location    Preferred lab    Send INR reminders to       Comments         ANTICOAG TODAY: Anticoagulation Summary as of 06/01/2015    INR goal    Selected INR 2.30 (06/01/2015)   Next INR check 06/29/2015   Target end date     Anticoagulation Episode Summary    INR check location    Preferred lab    Send INR reminders to    Comments       PATIENT INSTRUCTIONS: Patient Instructions  Patient instructed to take medications as defined in the Anti-coagulation Track section of this encounter.  Patient instructed to take today's dose.  Patient verbalized understanding of these instructions.       FOLLOW-UP Return in 4 weeks (on 06/29/2015) for Follow up INR at 0945h.  Hulen Luster, III Pharm.D., CACP

## 2015-06-01 NOTE — Patient Instructions (Signed)
Patient instructed to take medications as defined in the Anti-coagulation Track section of this encounter.  Patient instructed to take today's dose.  Patient verbalized understanding of these instructions.    

## 2015-06-29 ENCOUNTER — Ambulatory Visit (INDEPENDENT_AMBULATORY_CARE_PROVIDER_SITE_OTHER): Payer: Medicare Other | Admitting: Pharmacist

## 2015-06-29 DIAGNOSIS — Z86718 Personal history of other venous thrombosis and embolism: Secondary | ICD-10-CM

## 2015-06-29 DIAGNOSIS — Z7901 Long term (current) use of anticoagulants: Secondary | ICD-10-CM | POA: Diagnosis not present

## 2015-06-29 LAB — POCT INR: INR: 2.4

## 2015-06-29 NOTE — Patient Instructions (Signed)
Patient instructed to take medications as defined in the Anti-coagulation Track section of this encounter.  Patient instructed to take today's dose.  Patient verbalized understanding of these instructions.    

## 2015-06-29 NOTE — Progress Notes (Signed)
Anti-Coagulation Progress Note  Caroline [name] is a 70 y.o. female who is currently on an anti-coagulation regimen.    RECENT RESULTS: Recent results are below, the most recent result is correlated with a dose of 22 mg. per week: Lab Results  Component Value Date   INR 2.40 06/29/2015   INR 2.30 06/01/2015   INR 2.10 05/11/2015    ANTI-COAG DOSE: Anticoagulation Dose Instructions as of 06/29/2015      [name] Tue Wed Thu Fri Sat   New Dose 2 mg 4 mg 4 mg 4 mg 2 mg 4 mg 2 mg       ANTICOAG SUMMARY: Anticoagulation Episode Summary    Current INR goal    Next INR check 07/27/2015   INR from last check 2.40 (06/29/2015)   Weekly max dose    Target end date    INR check location    Preferred lab    Send INR reminders to       Comments         ANTICOAG TODAY: Anticoagulation Summary as of 06/29/2015    INR goal    Selected INR 2.40 (06/29/2015)   Next INR check 07/27/2015   Target end date     Anticoagulation Episode Summary    INR check location    Preferred lab    Send INR reminders to    Comments       PATIENT INSTRUCTIONS: Patient Instructions  Patient instructed to take medications as defined in the Anti-coagulation Track section of this encounter.  Patient instructed to take today's dose.  Patient verbalized understanding of these instructions.       FOLLOW-UP Return in 4 weeks (on 07/27/2015) for Follow up INR at 0945.  Hulen Luster, III Pharm.D., CACP

## 2015-07-08 IMAGING — DX DG CHEST 2V
2 series · 2 of 2 positions shown · non-contrast
Comparison: 01/03/2011

CLINICAL DATA: Abdominal pain

EXAM:
CHEST  2 VIEW

[chest pa]
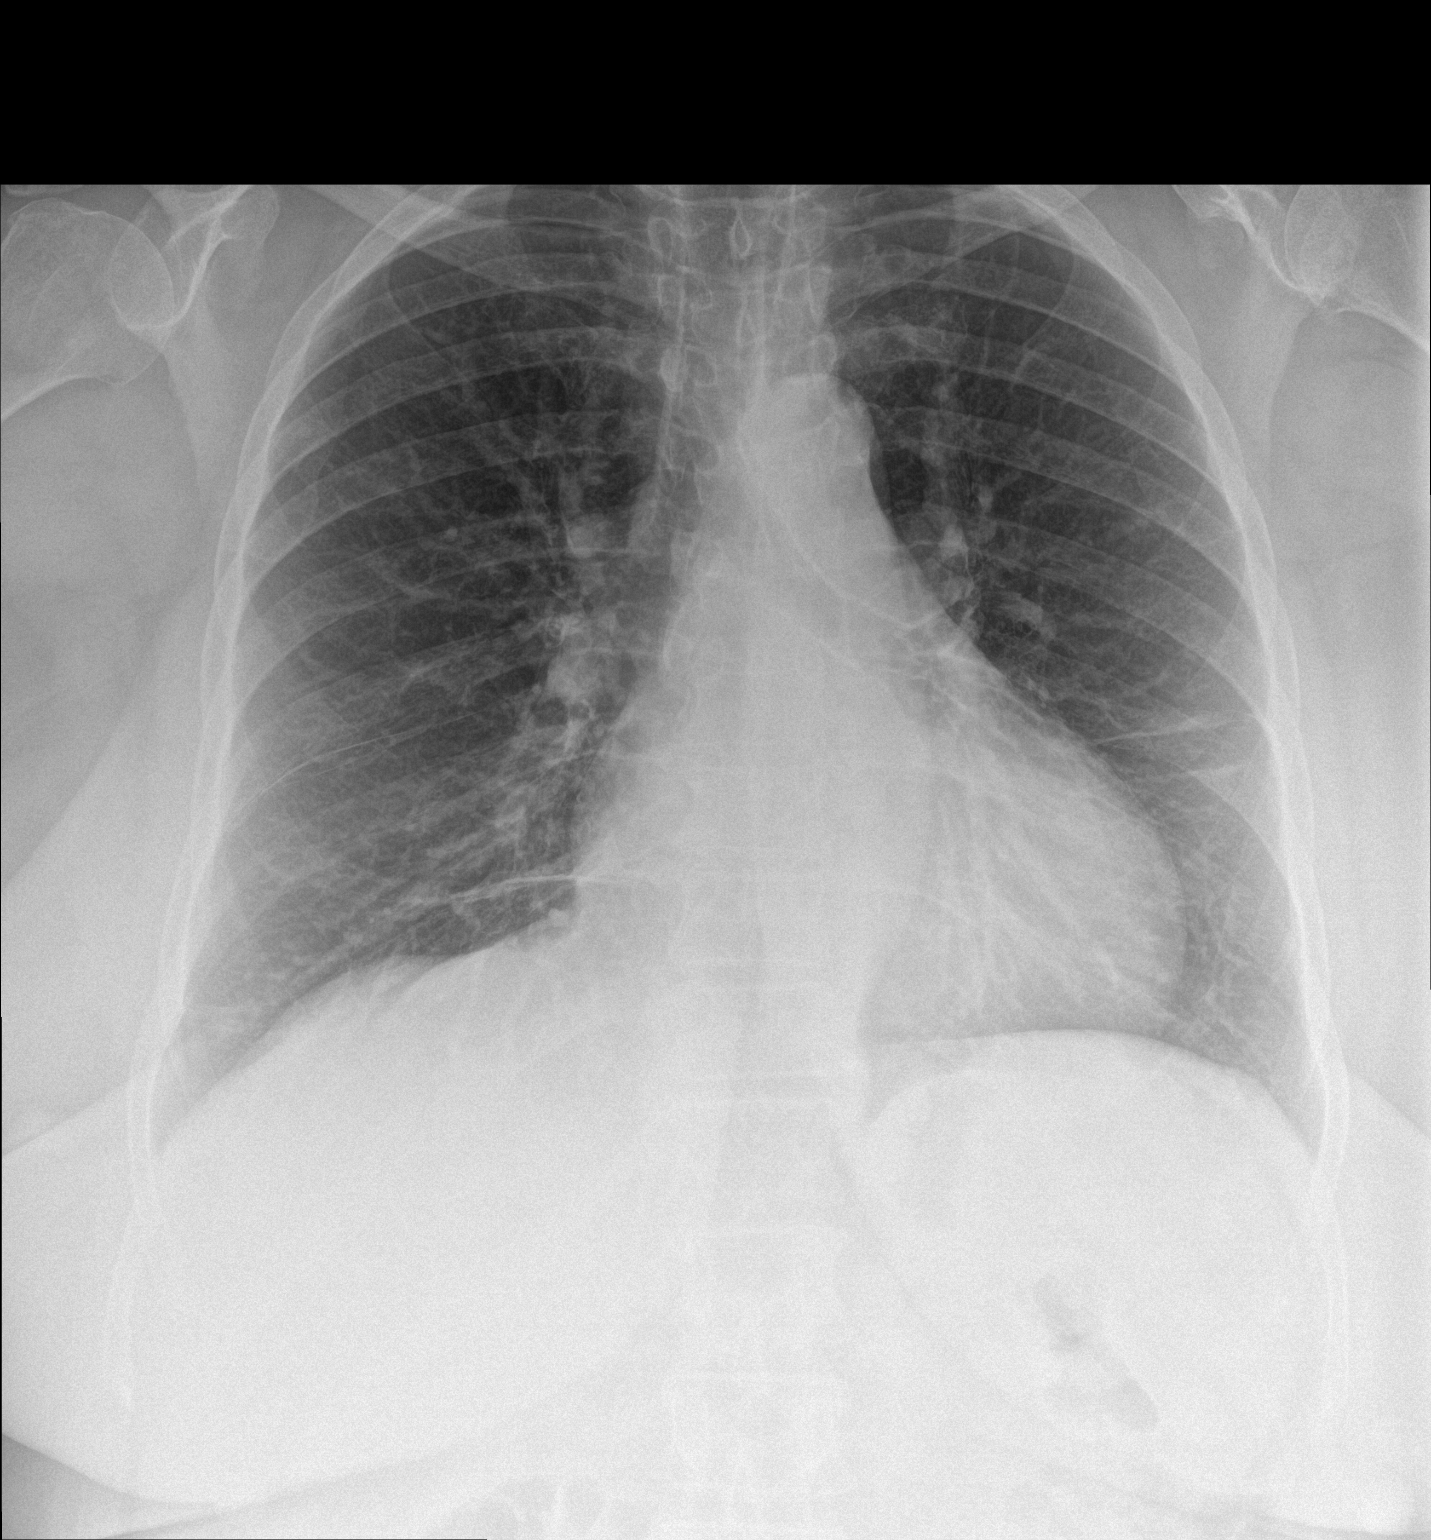

[chest lat]
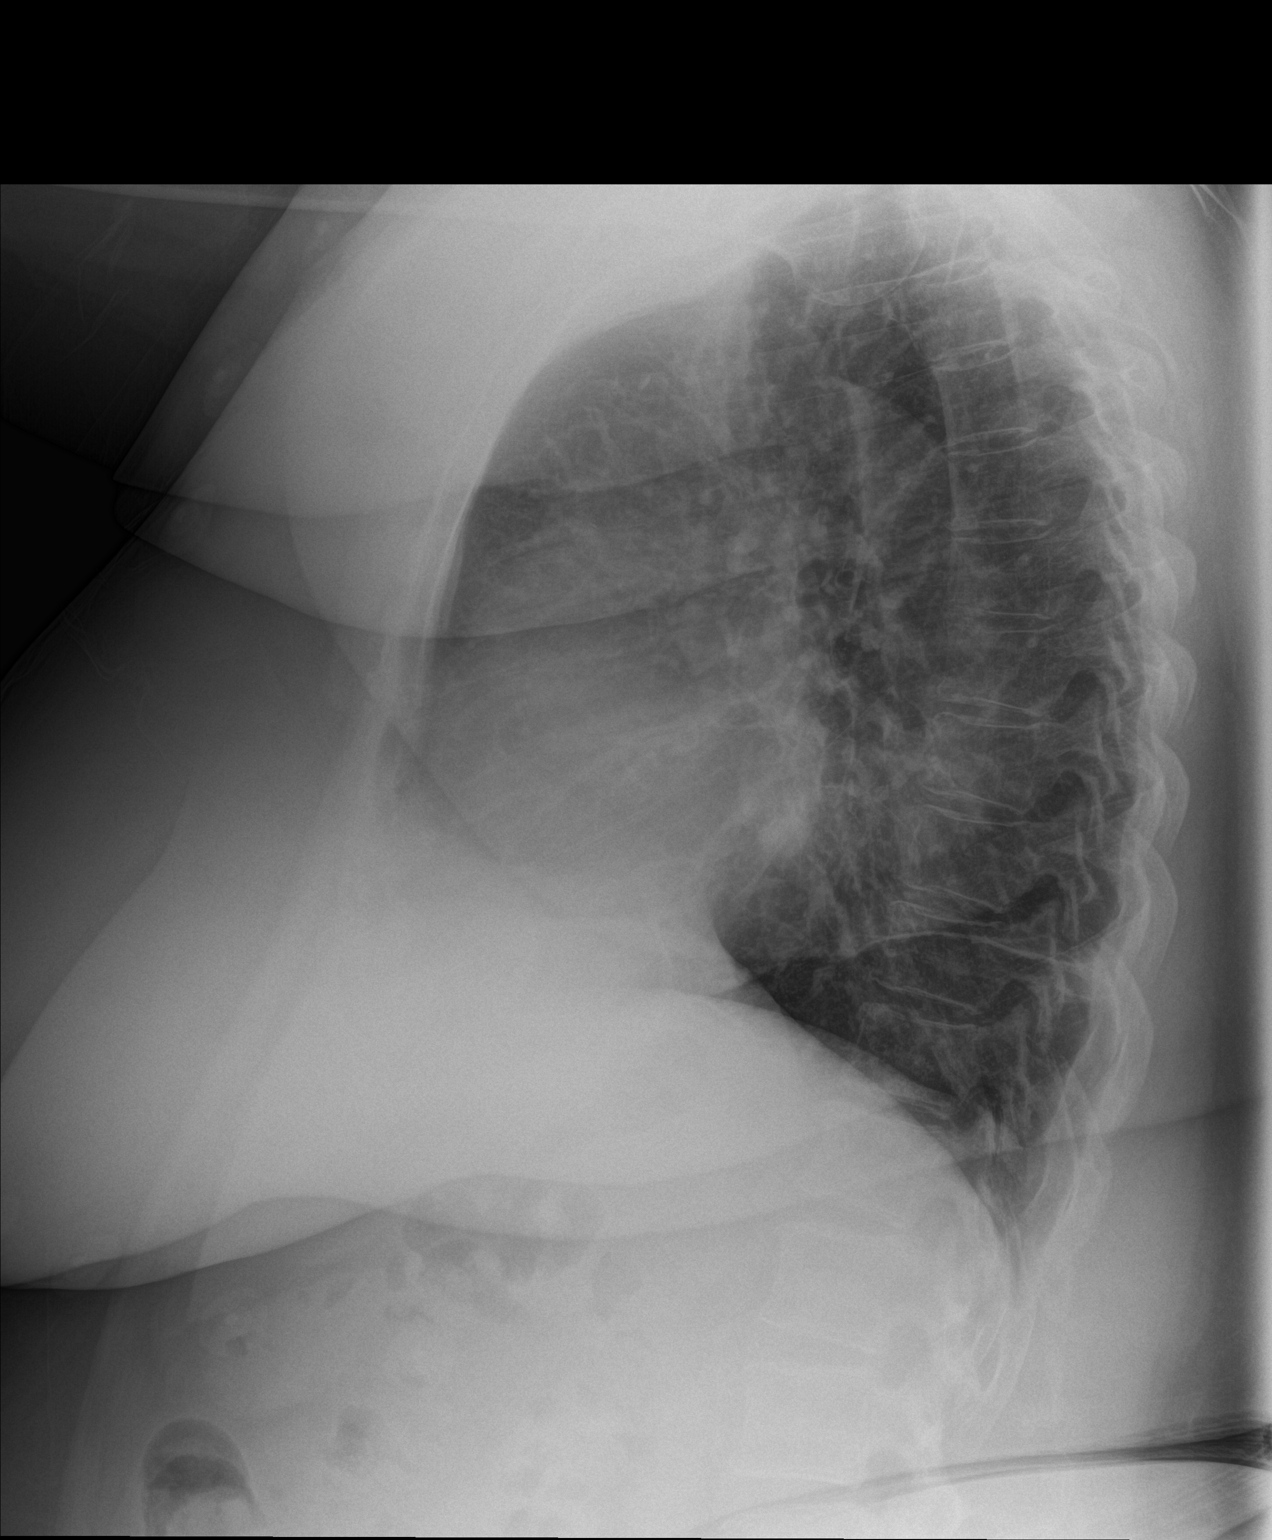

[2 of 2 positions shown; findings below may reference images not displayed]

FINDINGS: Cardiac enlargement without heart failure. Mild scarring in the lung
bases is stable. Negative for pneumonia or effusion. Negative for
mass lesion.
IMPRESSION: No active cardiopulmonary disease.

## 2015-07-27 ENCOUNTER — Ambulatory Visit (INDEPENDENT_AMBULATORY_CARE_PROVIDER_SITE_OTHER): Payer: Medicare Other | Admitting: Pharmacist

## 2015-07-27 DIAGNOSIS — I82409 Acute embolism and thrombosis of unspecified deep veins of unspecified lower extremity: Secondary | ICD-10-CM | POA: Diagnosis not present

## 2015-07-27 DIAGNOSIS — Z7901 Long term (current) use of anticoagulants: Secondary | ICD-10-CM

## 2015-07-27 LAB — POCT INR: INR: 2.4

## 2015-07-27 MED ORDER — WARFARIN SODIUM 4 MG PO TABS: ORAL_TABLET | ORAL | Status: DC

## 2015-07-27 NOTE — Patient Instructions (Signed)
Patient instructed to take medications as defined in the Anti-coagulation Track section of this encounter.  Patient instructed to take today's dose.  Patient verbalized understanding of these instructions.    

## 2015-07-27 NOTE — Progress Notes (Signed)
Anti-Coagulation Progress Note  Caroline [name] is a 70 y.o. female who is currently on an anti-coagulation regimen.    RECENT RESULTS: Recent results are below, the most recent result is correlated with a dose of 22 mg. per week: Lab Results  Component Value Date   INR 2.40 07/27/2015   INR 2.40 06/29/2015   INR 2.30 06/01/2015    ANTI-COAG DOSE: Anticoagulation Dose Instructions as of 07/27/2015      [name] Tue Wed Thu Fri Sat   New Dose 2 mg 4 mg 4 mg 4 mg 2 mg 4 mg 2 mg       ANTICOAG SUMMARY: Anticoagulation Episode Summary    Current INR goal    Next INR check 08/24/2015   INR from last check 2.40 (07/27/2015)   Weekly max dose    Target end date    INR check location    Preferred lab    Send INR reminders to       Comments         ANTICOAG TODAY: Anticoagulation Summary as of 07/27/2015    INR goal    Selected INR 2.40 (07/27/2015)   Next INR check 08/24/2015   Target end date     Anticoagulation Episode Summary    INR check location    Preferred lab    Send INR reminders to    Comments       PATIENT INSTRUCTIONS: Patient Instructions  Patient instructed to take medications as defined in the Anti-coagulation Track section of this encounter.  Patient instructed to take today's dose.  Patient verbalized understanding of these instructions.       FOLLOW-UP Return in 4 weeks (on 08/24/2015) for Follow up INR at 0930h.  Hulen Luster, III Pharm.D., CACP

## 2015-07-29 NOTE — Progress Notes (Signed)
Reviewed

## 2015-07-30 NOTE — Progress Notes (Signed)
I have reviewed Dr. Groce's note.  Patient is on AC for recurrent VTE.  INR at goal.  

## 2015-08-05 ENCOUNTER — Other Ambulatory Visit: Payer: Self-pay | Admitting: Internal Medicine

## 2015-08-05 DIAGNOSIS — R42 Dizziness and giddiness: Secondary | ICD-10-CM

## 2015-08-07 ENCOUNTER — Other Ambulatory Visit: Payer: Self-pay | Admitting: Internal Medicine

## 2015-08-24 ENCOUNTER — Ambulatory Visit (INDEPENDENT_AMBULATORY_CARE_PROVIDER_SITE_OTHER): Payer: Medicare Other | Admitting: Pharmacist

## 2015-08-24 DIAGNOSIS — Z86711 Personal history of pulmonary embolism: Secondary | ICD-10-CM | POA: Diagnosis not present

## 2015-08-24 DIAGNOSIS — Z86718 Personal history of other venous thrombosis and embolism: Secondary | ICD-10-CM

## 2015-08-24 DIAGNOSIS — Z7901 Long term (current) use of anticoagulants: Secondary | ICD-10-CM | POA: Diagnosis not present

## 2015-08-24 LAB — POCT INR: INR: 2.9

## 2015-08-24 NOTE — Progress Notes (Signed)
Anti-Coagulation Progress Note  Caroline [name] is a 70 y.o. female who is currently on an anti-coagulation regimen.    RECENT RESULTS: Recent results are below, the most recent result is correlated with a dose of 22 mg. per week: Lab Results  Component Value Date   INR 2.90 08/24/2015   INR 2.40 07/27/2015   INR 2.40 06/29/2015    ANTI-COAG DOSE: Anticoagulation Dose Instructions as of 08/24/2015      [name] Tue Wed Thu Fri Sat   New Dose 2 mg 4 mg 2 mg 4 mg 2 mg 4 mg 2 mg       ANTICOAG SUMMARY: Anticoagulation Episode Summary    Current INR goal:     TTR:   100.0 % (8.5 mo)  Next INR check:   09/21/2015  INR from last check:   2.90 (08/24/2015)  Weekly max dose:     Target end date:     INR check location:     Preferred lab:     Send INR reminders to:        Comments:           ANTICOAG TODAY: Anticoagulation Summary  As of 08/24/2015   INR goal:     TTR:     Today's INR:   2.90  Next INR check:   09/21/2015  Target end date:         Anticoagulation Episode Summary    INR check location:      Preferred lab:      Send INR reminders to:      Comments:         PATIENT INSTRUCTIONS: There are no Patient Instructions on file for this visit.   FOLLOW-UP Return in about 4 weeks (around 09/21/2015) for Follow up INR at 0930h.  Hulen Luster, III Pharm.D., CACP

## 2015-08-24 NOTE — Progress Notes (Signed)
INTERNAL MEDICINE TEACHING ATTENDING ADDENDUM - Chanson Teems M.D  °Duration- indefinite, Indication- DVT, PE, INR- therapeutic. Agree with pharmacy recommendations as outlined in their note.  ° ° ° °

## 2015-08-24 NOTE — Patient Instructions (Signed)
Patient instructed to take medications as defined in the Anti-coagulation Track section of this encounter.  Patient instructed to take today's dose.  Patient verbalized understanding of these instructions.    

## 2015-09-16 ENCOUNTER — Emergency Department (HOSPITAL_COMMUNITY): Payer: Medicare Other

## 2015-09-16 ENCOUNTER — Inpatient Hospital Stay (HOSPITAL_COMMUNITY): Payer: Medicare Other | Admitting: Anesthesiology

## 2015-09-16 ENCOUNTER — Encounter (HOSPITAL_COMMUNITY)
Admission: EM | Disposition: A | Discharge status: Skilled Nursing Facility | Payer: Self-pay | Source: Home / Self Care | Attending: Family Medicine

## 2015-09-16 ENCOUNTER — Inpatient Hospital Stay (HOSPITAL_COMMUNITY): Payer: Medicare Other

## 2015-09-16 ENCOUNTER — Encounter (HOSPITAL_COMMUNITY): Payer: Self-pay | Admitting: Radiology

## 2015-09-16 ENCOUNTER — Inpatient Hospital Stay (HOSPITAL_COMMUNITY)
Admission: EM | Admit: 2015-09-16 | Discharge: 2015-09-23 | DRG: 871 | Disposition: A | Discharge status: Skilled Nursing Facility | Payer: Medicare Other | Attending: Internal Medicine | Admitting: Internal Medicine

## 2015-09-16 DIAGNOSIS — Z7901 Long term (current) use of anticoagulants: Secondary | ICD-10-CM

## 2015-09-16 DIAGNOSIS — N1330 Unspecified hydronephrosis: Secondary | ICD-10-CM | POA: Diagnosis not present

## 2015-09-16 DIAGNOSIS — I110 Hypertensive heart disease with heart failure: Secondary | ICD-10-CM | POA: Diagnosis not present

## 2015-09-16 DIAGNOSIS — Z86711 Personal history of pulmonary embolism: Secondary | ICD-10-CM | POA: Diagnosis not present

## 2015-09-16 DIAGNOSIS — E785 Hyperlipidemia, unspecified: Secondary | ICD-10-CM | POA: Diagnosis not present

## 2015-09-16 DIAGNOSIS — A419 Sepsis, unspecified organism: Secondary | ICD-10-CM | POA: Diagnosis not present

## 2015-09-16 DIAGNOSIS — Z23 Encounter for immunization: Secondary | ICD-10-CM

## 2015-09-16 DIAGNOSIS — Z86718 Personal history of other venous thrombosis and embolism: Secondary | ICD-10-CM

## 2015-09-16 DIAGNOSIS — Z87891 Personal history of nicotine dependence: Secondary | ICD-10-CM | POA: Diagnosis not present

## 2015-09-16 DIAGNOSIS — Z79899 Other long term (current) drug therapy: Secondary | ICD-10-CM | POA: Diagnosis not present

## 2015-09-16 DIAGNOSIS — R6521 Severe sepsis with septic shock: Secondary | ICD-10-CM | POA: Diagnosis present

## 2015-09-16 DIAGNOSIS — R112 Nausea with vomiting, unspecified: Secondary | ICD-10-CM

## 2015-09-16 DIAGNOSIS — E876 Hypokalemia: Secondary | ICD-10-CM | POA: Diagnosis not present

## 2015-09-16 DIAGNOSIS — M6281 Muscle weakness (generalized): Secondary | ICD-10-CM | POA: Diagnosis not present

## 2015-09-16 DIAGNOSIS — G894 Chronic pain syndrome: Secondary | ICD-10-CM | POA: Diagnosis not present

## 2015-09-16 DIAGNOSIS — J45909 Unspecified asthma, uncomplicated: Secondary | ICD-10-CM | POA: Diagnosis present

## 2015-09-16 DIAGNOSIS — N139 Obstructive and reflux uropathy, unspecified: Secondary | ICD-10-CM

## 2015-09-16 DIAGNOSIS — E119 Type 2 diabetes mellitus without complications: Secondary | ICD-10-CM | POA: Diagnosis present

## 2015-09-16 DIAGNOSIS — R197 Diarrhea, unspecified: Secondary | ICD-10-CM

## 2015-09-16 DIAGNOSIS — Z6841 Body Mass Index (BMI) 40.0 and over, adult: Secondary | ICD-10-CM

## 2015-09-16 DIAGNOSIS — Z419 Encounter for procedure for purposes other than remedying health state, unspecified: Secondary | ICD-10-CM

## 2015-09-16 DIAGNOSIS — I5042 Chronic combined systolic (congestive) and diastolic (congestive) heart failure: Secondary | ICD-10-CM | POA: Diagnosis not present

## 2015-09-16 DIAGNOSIS — Z91013 Allergy to seafood: Secondary | ICD-10-CM

## 2015-09-16 DIAGNOSIS — N132 Hydronephrosis with renal and ureteral calculous obstruction: Secondary | ICD-10-CM | POA: Diagnosis present

## 2015-09-16 DIAGNOSIS — R1311 Dysphagia, oral phase: Secondary | ICD-10-CM | POA: Diagnosis not present

## 2015-09-16 DIAGNOSIS — R2681 Unsteadiness on feet: Secondary | ICD-10-CM | POA: Diagnosis not present

## 2015-09-16 DIAGNOSIS — E872 Acidosis: Secondary | ICD-10-CM | POA: Diagnosis not present

## 2015-09-16 DIAGNOSIS — Z880 Allergy status to penicillin: Secondary | ICD-10-CM | POA: Diagnosis not present

## 2015-09-16 DIAGNOSIS — N201 Calculus of ureter: Secondary | ICD-10-CM | POA: Diagnosis not present

## 2015-09-16 DIAGNOSIS — R944 Abnormal results of kidney function studies: Secondary | ICD-10-CM | POA: Diagnosis not present

## 2015-09-16 DIAGNOSIS — J9811 Atelectasis: Secondary | ICD-10-CM | POA: Diagnosis not present

## 2015-09-16 DIAGNOSIS — K219 Gastro-esophageal reflux disease without esophagitis: Secondary | ICD-10-CM | POA: Diagnosis present

## 2015-09-16 DIAGNOSIS — I425 Other restrictive cardiomyopathy: Secondary | ICD-10-CM | POA: Diagnosis not present

## 2015-09-16 DIAGNOSIS — R531 Weakness: Secondary | ICD-10-CM | POA: Diagnosis not present

## 2015-09-16 DIAGNOSIS — R278 Other lack of coordination: Secondary | ICD-10-CM | POA: Diagnosis not present

## 2015-09-16 DIAGNOSIS — Z91018 Allergy to other foods: Secondary | ICD-10-CM

## 2015-09-16 DIAGNOSIS — Z7951 Long term (current) use of inhaled steroids: Secondary | ICD-10-CM | POA: Diagnosis not present

## 2015-09-16 DIAGNOSIS — Z48816 Encounter for surgical aftercare following surgery on the genitourinary system: Secondary | ICD-10-CM | POA: Diagnosis not present

## 2015-09-16 DIAGNOSIS — B961 Klebsiella pneumoniae [K. pneumoniae] as the cause of diseases classified elsewhere: Secondary | ICD-10-CM | POA: Diagnosis not present

## 2015-09-16 DIAGNOSIS — I5043 Acute on chronic combined systolic (congestive) and diastolic (congestive) heart failure: Secondary | ICD-10-CM | POA: Diagnosis not present

## 2015-09-16 HISTORY — DX: Unspecified asthma, uncomplicated: J45.909

## 2015-09-16 HISTORY — PX: CYSTOSCOPY W/ URETERAL STENT PLACEMENT: SHX1429

## 2015-09-16 LAB — URINALYSIS, ROUTINE W REFLEX MICROSCOPIC
BILIRUBIN URINE: NEGATIVE
BILIRUBIN URINE: NEGATIVE
GLUCOSE, UA: NEGATIVE mg/dL
Glucose, UA: 100 mg/dL — AB
HGB URINE DIPSTICK: NEGATIVE
KETONES UR: NEGATIVE mg/dL
Ketones, ur: NEGATIVE mg/dL
NITRITE: NEGATIVE
Nitrite: NEGATIVE
PH: 7.5 (ref 5.0–8.0)
Protein, ur: NEGATIVE mg/dL
Protein, ur: >300 mg/dL — AB
SPECIFIC GRAVITY, URINE: 1.014 (ref 1.005–1.030)
SPECIFIC GRAVITY, URINE: 1.041 — AB (ref 1.005–1.030)
pH: 6.5 (ref 5.0–8.0)

## 2015-09-16 LAB — COMPREHENSIVE METABOLIC PANEL
ALBUMIN: 3.7 g/dL (ref 3.5–5.0)
ALK PHOS: 75 U/L (ref 38–126)
ALT: 25 U/L (ref 14–54)
ANION GAP: 15 (ref 5–15)
AST: 27 U/L (ref 15–41)
BUN: 19 mg/dL (ref 6–20)
CO2: 26 mmol/L (ref 22–32)
Calcium: 9 mg/dL (ref 8.9–10.3)
Chloride: 99 mmol/L — ABNORMAL LOW (ref 101–111)
Creatinine, Ser: 1.24 mg/dL — ABNORMAL HIGH (ref 0.44–1.00)
GFR calc Af Amer: 51 mL/min — ABNORMAL LOW (ref >60)
GFR calc non Af Amer: 44 mL/min — ABNORMAL LOW (ref >60)
GLUCOSE: 198 mg/dL — AB (ref 65–99)
POTASSIUM: 3.5 mmol/L (ref 3.5–5.1)
SODIUM: 140 mmol/L (ref 135–145)
Total Bilirubin: 0.6 mg/dL (ref 0.3–1.2)
Total Protein: 7.5 g/dL (ref 6.5–8.1)

## 2015-09-16 LAB — URINE MICROSCOPIC-ADD ON: BACTERIA UA: NONE SEEN

## 2015-09-16 LAB — LIPASE, BLOOD: Lipase: 36 U/L (ref 11–51)

## 2015-09-16 LAB — BLOOD CULTURE ID PANEL (REFLEXED)
Acinetobacter baumannii: NOT DETECTED
CANDIDA ALBICANS: NOT DETECTED
CANDIDA PARAPSILOSIS: NOT DETECTED
CANDIDA TROPICALIS: NOT DETECTED
CARBAPENEM RESISTANCE: NOT DETECTED
Candida glabrata: NOT DETECTED
Candida krusei: NOT DETECTED
ENTEROCOCCUS SPECIES: NOT DETECTED
Enterobacter cloacae complex: NOT DETECTED
Enterobacteriaceae species: DETECTED — AB
Escherichia coli: NOT DETECTED
HAEMOPHILUS INFLUENZAE: NOT DETECTED
KLEBSIELLA PNEUMONIAE: DETECTED — AB
Klebsiella oxytoca: NOT DETECTED
LISTERIA MONOCYTOGENES: NOT DETECTED
METHICILLIN RESISTANCE: NOT DETECTED
Neisseria meningitidis: NOT DETECTED
PROTEUS SPECIES: NOT DETECTED
Pseudomonas aeruginosa: NOT DETECTED
SERRATIA MARCESCENS: NOT DETECTED
STAPHYLOCOCCUS AUREUS BCID: NOT DETECTED
STREPTOCOCCUS PNEUMONIAE: NOT DETECTED
STREPTOCOCCUS PYOGENES: NOT DETECTED
Staphylococcus species: NOT DETECTED
Streptococcus agalactiae: NOT DETECTED
Streptococcus species: NOT DETECTED
Vancomycin resistance: NOT DETECTED

## 2015-09-16 LAB — CBC
HCT: 35.3 % — ABNORMAL LOW (ref 36.0–46.0)
HEMATOCRIT: 39.6 % (ref 36.0–46.0)
HEMOGLOBIN: 12.4 g/dL (ref 12.0–15.0)
Hemoglobin: 10.4 g/dL — ABNORMAL LOW (ref 12.0–15.0)
MCH: 24.9 pg — AB (ref 26.0–34.0)
MCH: 24.1 pg — ABNORMAL LOW (ref 26.0–34.0)
MCHC: 31.3 g/dL (ref 30.0–36.0)
MCHC: 29.5 g/dL — ABNORMAL LOW (ref 30.0–36.0)
MCV: 79.5 fL (ref 78.0–100.0)
MCV: 81.7 fL (ref 78.0–100.0)
PLATELETS: 122 10*3/uL — AB (ref 150–400)
Platelets: 154 10*3/uL (ref 150–400)
RBC: 4.98 MIL/uL (ref 3.87–5.11)
RBC: 4.32 MIL/uL (ref 3.87–5.11)
RDW: 14.4 % (ref 11.5–15.5)
RDW: 14.7 % (ref 11.5–15.5)
WBC: 9.4 10*3/uL (ref 4.0–10.5)
WBC: 10.6 10*3/uL — AB (ref 4.0–10.5)

## 2015-09-16 LAB — I-STAT CG4 LACTIC ACID, ED
LACTIC ACID, VENOUS: 6.44 mmol/L — AB (ref 0.5–1.9)
Lactic Acid, Venous: 5.05 mmol/L (ref 0.5–1.9)

## 2015-09-16 LAB — TYPE AND SCREEN
ABO/RH(D): B POS
Antibody Screen: NEGATIVE

## 2015-09-16 LAB — PROTIME-INR
INR: 1.65
Prothrombin Time: 19.7 seconds — ABNORMAL HIGH (ref 11.4–15.2)

## 2015-09-16 LAB — GLUCOSE, CAPILLARY: GLUCOSE-CAPILLARY: 177 mg/dL — AB (ref 65–99)

## 2015-09-16 LAB — STREP PNEUMONIAE URINARY ANTIGEN: STREP PNEUMO URINARY ANTIGEN: NEGATIVE

## 2015-09-16 LAB — ABO/RH: ABO/RH(D): B POS

## 2015-09-16 LAB — PROCALCITONIN: Procalcitonin: 49.03 ng/mL

## 2015-09-16 LAB — LACTIC ACID, PLASMA
LACTIC ACID, VENOUS: 4.8 mmol/L — AB (ref 0.5–1.9)
Lactic Acid, Venous: 2.6 mmol/L (ref 0.5–1.9)

## 2015-09-16 LAB — MRSA PCR SCREENING: MRSA by PCR: NEGATIVE

## 2015-09-16 LAB — TROPONIN I: Troponin I: 0.1 ng/mL (ref <0.03)

## 2015-09-16 LAB — CORTISOL: CORTISOL PLASMA: 31 ug/dL

## 2015-09-16 SURGERY — CYSTOSCOPY, WITH RETROGRADE PYELOGRAM AND URETERAL STENT INSERTION
Anesthesia: General | Site: Urethra | Laterality: Right

## 2015-09-16 MED ORDER — PHENYLEPHRINE HCL 10 MG/ML IJ SOLN
20.0000 ug/min | INTRAVENOUS | Status: DC
  Administered 2015-09-16 (×2): 80 ug/min via INTRAVENOUS
  Administered 2015-09-16: 25 ug/min via INTRAVENOUS
  Administered 2015-09-16: 20 ug/min via INTRAVENOUS
  Administered 2015-09-17: 80 ug/min via INTRAVENOUS
  Administered 2015-09-17: 60 ug/min via INTRAVENOUS
  Administered 2015-09-17: 80 ug/min via INTRAVENOUS
  Filled 2015-09-16 (×8): qty 1

## 2015-09-16 MED ORDER — 0.9 % SODIUM CHLORIDE (POUR BTL) OPTIME
TOPICAL | Status: DC | PRN
  Administered 2015-09-16: 1000 mL

## 2015-09-16 MED ORDER — SUCCINYLCHOLINE CHLORIDE 200 MG/10ML IV SOSY
PREFILLED_SYRINGE | INTRAVENOUS | Status: DC | PRN
  Administered 2015-09-16: 140 mg via INTRAVENOUS

## 2015-09-16 MED ORDER — ONDANSETRON HCL 4 MG/2ML IJ SOLN
INTRAMUSCULAR | Status: DC | PRN
  Administered 2015-09-16: 4 mg via INTRAVENOUS

## 2015-09-16 MED ORDER — DEXTROSE 5 % IV SOLN
2.0000 g | Freq: Two times a day (BID) | INTRAVENOUS | Status: DC
  Administered 2015-09-16: 2 g via INTRAVENOUS
  Filled 2015-09-16 (×3): qty 2

## 2015-09-16 MED ORDER — LACTATED RINGERS IV SOLN
INTRAVENOUS | Status: DC
  Administered 2015-09-16 (×2): via INTRAVENOUS

## 2015-09-16 MED ORDER — ACETAMINOPHEN 500 MG PO TABS: 1000.0000 mg | ORAL_TABLET | Freq: Once | ORAL | Status: DC | PRN

## 2015-09-16 MED ORDER — AZTREONAM 2 G IJ SOLR: 2.0000 g | Freq: Once | INTRAMUSCULAR | Status: DC

## 2015-09-16 MED ORDER — ONDANSETRON HCL 4 MG/2ML IJ SOLN
4.0000 mg | Freq: Once | INTRAMUSCULAR | Status: AC
  Administered 2015-09-16: 4 mg via INTRAVENOUS
  Filled 2015-09-16: qty 2

## 2015-09-16 MED ORDER — SODIUM CHLORIDE 0.9 % IV SOLN
INTRAVENOUS | Status: DC
  Administered 2015-09-16: 16:00:00 via INTRAVENOUS
  Administered 2015-09-17 – 2015-09-19 (×2): 100 mL/h via INTRAVENOUS
  Administered 2015-09-19 – 2015-09-20 (×3): via INTRAVENOUS

## 2015-09-16 MED ORDER — MORPHINE SULFATE (PF) 4 MG/ML IV SOLN
4.0000 mg | Freq: Once | INTRAVENOUS | Status: AC
  Administered 2015-09-16: 4 mg via INTRAVENOUS
  Filled 2015-09-16: qty 1

## 2015-09-16 MED ORDER — FENTANYL CITRATE (PF) 100 MCG/2ML IJ SOLN
INTRAMUSCULAR | Status: DC | PRN
  Administered 2015-09-16: 50 ug via INTRAVENOUS

## 2015-09-16 MED ORDER — SUGAMMADEX SODIUM 200 MG/2ML IV SOLN
INTRAVENOUS | Status: DC | PRN
  Administered 2015-09-16: 200 mg via INTRAVENOUS

## 2015-09-16 MED ORDER — STERILE WATER FOR IRRIGATION IR SOLN
Status: DC | PRN
  Administered 2015-09-16: 3000 mL

## 2015-09-16 MED ORDER — VANCOMYCIN HCL 10 G IV SOLR
2000.0000 mg | Freq: Once | INTRAVENOUS | Status: AC
  Administered 2015-09-16: 2000 mg via INTRAVENOUS
  Filled 2015-09-16: qty 2000

## 2015-09-16 MED ORDER — PROMETHAZINE HCL 25 MG/ML IJ SOLN: 6.2500 mg | INTRAMUSCULAR | Status: DC | PRN

## 2015-09-16 MED ORDER — SODIUM CHLORIDE 0.9 % IV BOLUS (SEPSIS)
1000.0000 mL | Freq: Once | INTRAVENOUS | Status: AC
  Administered 2015-09-16: 1000 mL via INTRAVENOUS

## 2015-09-16 MED ORDER — VANCOMYCIN HCL IN DEXTROSE 1-5 GM/200ML-% IV SOLN: 1000.0000 mg | Freq: Once | INTRAVENOUS | Status: DC

## 2015-09-16 MED ORDER — LIDOCAINE HCL 2 % EX GEL
CUTANEOUS | Status: AC
  Filled 2015-09-16: qty 20

## 2015-09-16 MED ORDER — FENTANYL CITRATE (PF) 100 MCG/2ML IJ SOLN
INTRAMUSCULAR | Status: AC
  Filled 2015-09-16: qty 2

## 2015-09-16 MED ORDER — VANCOMYCIN HCL 10 G IV SOLR
1500.0000 mg | INTRAVENOUS | Status: DC
  Administered 2015-09-17: 1500 mg via INTRAVENOUS
  Filled 2015-09-16: qty 1500

## 2015-09-16 MED ORDER — IOPAMIDOL (ISOVUE-300) INJECTION 61%
INTRAVENOUS | Status: AC
  Administered 2015-09-16: 100 mL
  Filled 2015-09-16: qty 100

## 2015-09-16 MED ORDER — FENTANYL CITRATE (PF) 100 MCG/2ML IJ SOLN: 25.0000 ug | INTRAMUSCULAR | Status: DC | PRN

## 2015-09-16 MED ORDER — FENTANYL CITRATE (PF) 100 MCG/2ML IJ SOLN
50.0000 ug | Freq: Once | INTRAMUSCULAR | Status: AC
Start: 2015-09-16 — End: 2015-09-16
  Administered 2015-09-16: 50 ug via INTRAVENOUS
  Filled 2015-09-16: qty 2

## 2015-09-16 MED ORDER — PROPOFOL 10 MG/ML IV BOLUS
INTRAVENOUS | Status: DC | PRN
  Administered 2015-09-16: 150 mg via INTRAVENOUS

## 2015-09-16 MED ORDER — MIDAZOLAM HCL 2 MG/2ML IJ SOLN
INTRAMUSCULAR | Status: AC
  Filled 2015-09-16: qty 2

## 2015-09-16 MED ORDER — LIDOCAINE HCL (CARDIAC) 20 MG/ML IV SOLN
INTRAVENOUS | Status: DC | PRN
  Administered 2015-09-16: 100 mg via INTRAVENOUS

## 2015-09-16 MED ORDER — MIDAZOLAM HCL 5 MG/5ML IJ SOLN
INTRAMUSCULAR | Status: DC | PRN
  Administered 2015-09-16: 1 mg via INTRAVENOUS

## 2015-09-16 MED ORDER — PHENYLEPHRINE 40 MCG/ML (10ML) SYRINGE FOR IV PUSH (FOR BLOOD PRESSURE SUPPORT)
PREFILLED_SYRINGE | INTRAVENOUS | Status: DC | PRN
  Administered 2015-09-16: 120 ug via INTRAVENOUS
  Administered 2015-09-16: 200 ug via INTRAVENOUS
  Administered 2015-09-16: 80 ug via INTRAVENOUS

## 2015-09-16 MED ORDER — ACETAMINOPHEN 325 MG PO TABS
650.0000 mg | ORAL_TABLET | Freq: Once | ORAL | Status: AC
  Administered 2015-09-16: 650 mg via ORAL
  Filled 2015-09-16: qty 2

## 2015-09-16 MED ORDER — ROCURONIUM BROMIDE 10 MG/ML (PF) SYRINGE
PREFILLED_SYRINGE | INTRAVENOUS | Status: DC | PRN
  Administered 2015-09-16: 40 mg via INTRAVENOUS

## 2015-09-16 MED ORDER — IOPAMIDOL (ISOVUE-300) INJECTION 61%
INTRAVENOUS | Status: DC | PRN
  Administered 2015-09-16: 4 mL via INTRAVENOUS

## 2015-09-16 MED ORDER — LIDOCAINE HCL 2 % EX GEL
CUTANEOUS | Status: DC | PRN
  Administered 2015-09-16: 1

## 2015-09-16 MED ORDER — IOPAMIDOL (ISOVUE-300) INJECTION 61%
INTRAVENOUS | Status: AC
  Filled 2015-09-16: qty 50

## 2015-09-16 MED ORDER — DEXTROSE 5 % IV SOLN
2.0000 g | Freq: Once | INTRAVENOUS | Status: AC
  Administered 2015-09-16: 2 g via INTRAVENOUS
  Filled 2015-09-16: qty 2

## 2015-09-16 MED ORDER — PROPOFOL 10 MG/ML IV BOLUS
INTRAVENOUS | Status: AC
Start: 2015-09-16 — End: 2015-09-16
  Filled 2015-09-16: qty 20

## 2015-09-16 MED ORDER — LEVOFLOXACIN IN D5W 750 MG/150ML IV SOLN: 750.0000 mg | Freq: Once | INTRAVENOUS | Status: DC

## 2015-09-16 SURGICAL SUPPLY — 36 items
ADAPTER CATH URET PLST 4-6FR (CATHETERS) IMPLANT
ADPR CATH URET STRL DISP 4-6FR (CATHETERS)
APL SKNCLS STERI-STRIP NONHPOA (GAUZE/BANDAGES/DRESSINGS)
BAG URIMETER 350ML BARDEX IC (UROLOGICAL SUPPLIES) ×1
BAG URIMETER BARDEX IC 350 (UROLOGICAL SUPPLIES) ×1 IMPLANT
BAG URINE DRAINAGE (UROLOGICAL SUPPLIES) ×3 IMPLANT
BENZOIN TINCTURE PRP APPL 2/3 (GAUZE/BANDAGES/DRESSINGS) IMPLANT
BLADE 10 SAFETY STRL DISP (BLADE) ×3 IMPLANT
BLADE SURG ROTATE 9660 (MISCELLANEOUS) IMPLANT
BUCKET BIOHAZARD WASTE 5 GAL (MISCELLANEOUS) ×3 IMPLANT
CATH FOLEY 2WAY SLVR  5CC 16FR (CATHETERS) ×2
CATH FOLEY 2WAY SLVR 5CC 16FR (CATHETERS) IMPLANT
CATH INTERMIT  6FR 70CM (CATHETERS) ×2 IMPLANT
CATH URET 5FR 28IN CONE TIP (BALLOONS)
CATH URET 5FR 28IN OPEN ENDED (CATHETERS) ×3 IMPLANT
CATH URET 5FR 70CM CONE TIP (BALLOONS) IMPLANT
COVER SURGICAL LIGHT HANDLE (MISCELLANEOUS) ×3 IMPLANT
DRAPE CAMERA CLOSED 9X96 (DRAPES) ×6 IMPLANT
GLOVE BIO SURGEON STRL SZ7.5 (GLOVE) ×3 IMPLANT
GOWN STRL REUS W/ TWL XL LVL3 (GOWN DISPOSABLE) ×2 IMPLANT
GOWN STRL REUS W/TWL XL LVL3 (GOWN DISPOSABLE) ×6
GUIDEWIRE COOK  .035 (WIRE) IMPLANT
GUIDEWIRE STR DUAL SENSOR (WIRE) ×2 IMPLANT
H R LUBE JELLY XXX (MISCELLANEOUS) ×3 IMPLANT
KIT ROOM TURNOVER OR (KITS) ×3 IMPLANT
NS IRRIG 1000ML POUR BTL (IV SOLUTION) ×3 IMPLANT
PACK CYSTOSCOPY (CUSTOM PROCEDURE TRAY) ×3 IMPLANT
PAD ARMBOARD 7.5X6 YLW CONV (MISCELLANEOUS) ×6 IMPLANT
PLUG CATH AND CAP STER (CATHETERS) IMPLANT
STENT URET 6FRX24 CONTOUR (STENTS) ×2 IMPLANT
SYR 20CC LL (SYRINGE) ×3 IMPLANT
SYR CONTROL 10ML LL (SYRINGE) ×3 IMPLANT
SYRINGE TOOMEY DISP (SYRINGE) IMPLANT
UNDERPAD 30X30 (UNDERPADS AND DIAPERS) ×3 IMPLANT
WATER STERILE IRR 1000ML POUR (IV SOLUTION) ×3 IMPLANT
WIRE COONS/BENSON .038X145CM (WIRE) IMPLANT

## 2015-09-16 NOTE — Transfer of Care (Signed)
Immediate Anesthesia Transfer of Care Note  Patient: Caroline [name]  Procedure(s) Performed: Procedure(s): CYSTOSCOPY WITH RETROGRADE PYELOGRAM/URETERAL STENT PLACEMENT (Right)  Patient Location: PACU  Anesthesia Type:General  Level of Consciousness: patient cooperative and responds to stimulation  Airway & Oxygen Therapy: Patient Spontanous Breathing and Patient connected to nasal cannula oxygen  Post-op Assessment: Report given to RN and Post -op Vital signs reviewed and stable  Post vital signs: Reviewed and stable  Last Vitals:  Vitals:   09/16/15 1130 09/16/15 1346  BP: 116/65   Pulse: 117   Resp: (!) 29   Temp:  37.1 C    Last Pain:  Vitals:   09/16/15 1346  TempSrc:   PainSc: 0-No pain         Complications: No apparent anesthesia complications

## 2015-09-16 NOTE — Op Note (Signed)
Preoperative diagnosis: Right ureteral stone and sepsis Postoperative diagnosis: Right ureteral stone and sepsis Surgery: Cystoscopy; right retrograde ureterogram; and insertion of right ureteral stent Surgeon: Dr. Lorin Picket Delquan Poucher  The patient has the above diagnoses and consented to the above procedure. Extra care was taken with leg positioning to minimize the risk of compartment syndrome and neuropathy and deep vein thrombosis.  A 24 French cystoscope was utilized. The bladder mucosa and trigone were normal other than mild cystitis cystica. The ureteral orifices were smaller but easily identified  Using fluoroscopy I passed a sensor wire to the mid ureter proximal to the stone. I then passed an open-ended ureteral catheter 6 French in size to that level removing the wire  Retrograde ureterogram: I did a gentle retrograde with 3 mL of contrast just to gently fill and see a moderately dilated right renal pelvis and calyces. The ureter was also little bit dilated. I could see the stone at the right iliac vessels.  A sensor wire was passed to the upper pole calyx. A 24 French by 6 mm double-J stent without the string was passed with a little bit of resistance all the way the upper pole calyx removing the wire. X-rays were taken and cystoscopically and fluoroscopically the stent was in excellent position  The patient had tremendous and impressive volcano affect on passage of the stent  She'll be followed with a sepsis protocol

## 2015-09-16 NOTE — Progress Notes (Signed)
PHARMACY - PHYSICIAN COMMUNICATION CRITICAL VALUE ALERT - BLOOD CULTURE IDENTIFICATION (BCID)  Results for orders placed or performed during the hospital encounter of 09/16/15  Blood Culture ID Panel (Reflexed) (Collected: 09/16/2015  8:30 AM)  Result Value Ref Range   Enterococcus species NOT DETECTED NOT DETECTED   Vancomycin resistance NOT DETECTED NOT DETECTED   Listeria monocytogenes NOT DETECTED NOT DETECTED   Staphylococcus species NOT DETECTED NOT DETECTED   Staphylococcus aureus NOT DETECTED NOT DETECTED   Methicillin resistance NOT DETECTED NOT DETECTED   Streptococcus species NOT DETECTED NOT DETECTED   Streptococcus agalactiae NOT DETECTED NOT DETECTED   Streptococcus pneumoniae NOT DETECTED NOT DETECTED   Streptococcus pyogenes NOT DETECTED NOT DETECTED   Acinetobacter baumannii NOT DETECTED NOT DETECTED   Enterobacteriaceae species DETECTED (A) NOT DETECTED   Enterobacter cloacae complex NOT DETECTED NOT DETECTED   Escherichia coli NOT DETECTED NOT DETECTED   Klebsiella oxytoca NOT DETECTED NOT DETECTED   Klebsiella pneumoniae DETECTED (A) NOT DETECTED   Proteus species NOT DETECTED NOT DETECTED   Serratia marcescens NOT DETECTED NOT DETECTED   Carbapenem resistance NOT DETECTED NOT DETECTED   Haemophilus influenzae NOT DETECTED NOT DETECTED   Neisseria meningitidis NOT DETECTED NOT DETECTED   Pseudomonas aeruginosa NOT DETECTED NOT DETECTED   Candida albicans NOT DETECTED NOT DETECTED   Candida glabrata NOT DETECTED NOT DETECTED   Candida krusei NOT DETECTED NOT DETECTED   Candida parapsilosis NOT DETECTED NOT DETECTED   Candida tropicalis NOT DETECTED NOT DETECTED    Name of physician (or Provider) Contacted:  Dr. Arsenio Loader  Changes to prescribed antibiotics required:  No changes. Continue Cefepime and Vancomycin.  Team to decide if Vancomycin can be discontinued tomorrow, after at least 24 hrs of Vanc.  Dennie Fetters, [place] Pager: [phone] 09/16/2015  8:11  PM

## 2015-09-16 NOTE — Anesthesia Preprocedure Evaluation (Addendum)
Anesthesia Evaluation  Patient identified by MRN, date of birth, ID band Patient awake    Reviewed: Allergy & Precautions, NPO status , Patient's Chart, lab work & pertinent test results  History of Anesthesia Complications Negative for: history of anesthetic complications  Airway Mallampati: III  TM Distance: >3 FB Neck ROM: Full    Dental  (+)    Pulmonary neg shortness of breath, asthma (used inhaler last night) , neg sleep apnea, neg COPD, neg recent URI, former smoker,    breath sounds clear to auscultation       Cardiovascular hypertension, Pt. on medications and Pt. on home beta blockers + angina (patient describes sharp pain with exertion that is relieved with Tylenol) +CHF (evidence on CXR of CHF) and + DOE (sinus tachycardia, RBBB, LAFB)  (-) Past MI and (-) Cardiac Stents  Rhythm:Regular Rate:Normal     Neuro/Psych vertigo    GI/Hepatic Neg liver ROS, GERD  Medicated and Controlled,  Endo/Other  diabetes, Type obesity  Renal/GU ARFRenal disease (secondary to obstructing stone causing right hydronephrosis, Cr 1.24)     Musculoskeletal   Abdominal (+) + obese,   Peds  Hematology  (+) Blood dyscrasia (unprovoked PE in 2002, on coumadin, INR 1.65), ,   Anesthesia Other Findings HLD  Reproductive/Obstetrics                            Anesthesia Physical Anesthesia Plan  ASA: III and emergent  Anesthesia Plan: General   Post-op Pain Management:    Induction: Intravenous  Airway Management Planned: Oral ETT  Additional Equipment:   Intra-op Plan:   Post-operative Plan: Extubation in OR  Informed Consent: I have reviewed the patients History and Physical, chart, labs and discussed the procedure including the risks, benefits and alternatives for the proposed anesthesia with the patient or authorized representative who has indicated his/her understanding and acceptance.    Dental advisory given  Plan Discussed with:   Anesthesia Plan Comments: (Risks of general anesthesia discussed including, but not limited to, sore throat, hoarse voice, chipped/damaged teeth, injury to vocal cords, nausea and vomiting, allergic reactions, lung infection, heart attack, stroke, and death. Also discussed possibility of postop intubation given findings on CXR. All questions answered. )        Anesthesia Quick Evaluation

## 2015-09-16 NOTE — Progress Notes (Signed)
Pharmacy Antibiotic Note  Caroline [name] is a 70 y.o. female admitted on 09/16/2015 with sepsis.  Pharmacy has been consulted for cefepime and vancomycin dosing. Pt presents with nausea, vomiting and diarrhea. Pt has listed allergy to penicillins but can't recall any issues with cephalosporins and is willing to trial this class.  Pt received vancomycin 2g and cefepime 2g IV once in the ED.  Plan: Vancomycin 1500mg  IV every 24 hours.  Goal trough 15-20 mcg/mL.  Cefepime 2g IV q12h  Monitor culture data, renal function and clinical course VT at SS prn  Height: 5\' 3"  (160 cm) Weight: 280 lb (127 kg) IBW/kg (Calculated) : 52.4  Temp (24hrs), Avg:100.4 F (38 C), Min:99.5 F (37.5 C), Max:101.2 F (38.4 C)   Recent Labs Lab 09/16/15 0538 09/16/15 0840  WBC 9.4  --   CREATININE 1.24*  --   LATICACIDVEN  --  6.44*    Estimated Creatinine Clearance: 56.3 mL/min (by C-G formula based on SCr of 1.24 mg/dL).    Allergies  Allergen Reactions  . Penicillins Swelling  . Shellfish Allergy Swelling  . Tomato Swelling    Antimicrobials this admission: Cefepime 9/6 >>  Vanc 9/6 >>   Dose adjustments this admission: n/a  Microbiology results: 9/6 BCx: sent 9/6 UCx: sent   Sputum:    MRSA PCR:    Arlean Hopping. Newman Pies, PharmD, BCPS Clinical Pharmacist Pager [phone] 09/16/2015 9:02 AM

## 2015-09-16 NOTE — Progress Notes (Addendum)
eLink Physician-Brief Progress Note Patient Name: Caroline [name] DOB: 11-19-1945 MRN: [id number]   Date of Service  09/16/2015  HPI/Events of Note  70 yo female with PMH of HTN, DM, PE and Asthma. Admitted to ICU with R ureteral stone with sepsis. Now s/p R ureteral stent in OR by urology. BP = 99/50 (MAP = 60). Lactic Acid = 5.05 >> 4.8.  eICU Interventions  Will order: 1. 0.9 NaCl 1 liter IV over 1 hour now.      Intervention Category Evaluation Type: New Patient Evaluation  Lenell Antu 09/16/2015, 4:33 PM

## 2015-09-16 NOTE — Progress Notes (Signed)
eLink Physician-Brief Progress Note Patient Name: JISELE LERER DOB: 07-Oct-1945 MRN: [id number]   Date of Service  09/16/2015  HPI/Events of Note  Request to repeat Lactic Acid level post fluid bolus.   eICU Interventions  Repeat Lactic Acid level ordered.     Intervention Category Major Interventions: Acid-Base disturbance - evaluation and management  Samaria Anes [place] 09/16/2015, 7:25 PM

## 2015-09-16 NOTE — Progress Notes (Signed)
Earrings placed in a 'cup' with lid and given to family member.  DA

## 2015-09-16 NOTE — ED Notes (Signed)
101.2 Rectally

## 2015-09-16 NOTE — ED Provider Notes (Signed)
[facility]-EMERGENCY DEPT Provider Note   CSN: [id number] Arrival date & time: 09/16/15  0434     History   Chief Complaint Chief Complaint  Patient presents with  . Emesis  . Diarrhea  . Chills    HPI Caroline [name] is a 70 y.o. female.  Caroline [name] is a 70 y.o. Female who presents to the ED complaining of nausea, vomiting, diarrhea and lower abdominal pain since last night. Patient reports multiple episodes of vomiting and one episode of diarrhea. She reports lower abdominal pain that is worse in her RLQ. She reports subjective fevers. No hematemesis or hematochezia. Patient reports previous abdominal hysterectomy years ago. No other abdominal surgeries noted. She reports some intermittent stinging with urination for the past month but this has resolved recently. No other urinary symptoms. Patient denies cough, chest pain, shortness of breath, urinary frequency, urinary urgency, hematemesis, hematochezia, rashes.    The history is provided by the patient. No language interpreter was used.  Emesis   Associated symptoms include abdominal pain, chills, diarrhea and a fever (subjective ). Pertinent negatives include no cough and no headaches.  Diarrhea   Associated symptoms include abdominal pain, vomiting and chills. Pertinent negatives include no headaches and no cough.    Past Medical History:  Diagnosis Date  . Asthma   . Diabetes mellitus   . DVT, lower extremity, recurrent (HCC)    Patient had unprovoked PE on 2002 and DVT in right lower extremety 2008.  [name]. Hypertension   . PE (pulmonary embolism)    Patient had unprovoked PE on 2002  . Vertigo     Patient Active Problem List   Diagnosis Date Noted  . Sepsis (HCC) 09/16/2015  . History of exertional chest pain 08/26/2013  . DVT, lower extremity, recurrent (HCC)   . Vertigo 09/13/2011  . Healthcare maintenance 09/13/2011  . Allergic rhinitis 06/21/2006  . Hyperlipidemia 06/10/2006  . Essential hypertension 06/10/2006    . Mild persistent asthma in adult without complication 06/10/2006    History reviewed. No pertinent surgical history.  OB History    No data available       Home Medications    Prior to Admission medications   Medication Sig Start Date End Date Taking? Authorizing Provider  acetaminophen (TYLENOL) 500 MG tablet Take 500 mg by mouth every 6 (six) hours as needed for mild pain or moderate pain. pain   Yes Historical Provider, MD  calcium citrate-vitamin D (CITRACAL+D) 315-200 MG-UNIT tablet Take 2 tablets by mouth daily. 05/05/15  Yes Carly Arlyce HarmanJ Rivet, MD  hydrochlorothiazide (HYDRODIURIL) 25 MG tablet Take 1 tablet (25 mg total) by mouth daily. 05/05/15  Yes Carly J Rivet, MD  hydrocortisone (PROCTOSOL HC) 2.5 % rectal cream PLACE 1 APPLICATION RECTALLY 2 (TWO) TIMES DAILY. 12/15/14  Yes Carly Arlyce HarmanJ Rivet, MD  meclizine (ANTIVERT) 25 MG tablet TAKE 1 TABLET (25 MG TOTAL) BY MOUTH 3 (THREE) TIMES DAILY AS NEEDED. 08/05/15  Yes Carly J Rivet, MD  metoprolol tartrate (LOPRESSOR) 25 MG tablet TAKE 1 TABLET (25 MG TOTAL) BY MOUTH 2 (TWO) TIMES DAILY. 08/08/15  Yes Carly J Rivet, MD  montelukast (SINGULAIR) 10 MG tablet Take 1 tablet (10 mg total) by mouth at bedtime. 12/15/14  Yes Carly Arlyce HarmanJ Rivet, MD  omeprazole (PRILOSEC) 20 MG capsule TAKE 1 CAPSULE (20 MG TOTAL) BY MOUTH DAILY. Patient taking differently: TAKE 1 CAPSULE (20 MG TOTAL) BY MOUTH AS NEEDED FOR ACID REFLUX 11/28/13  Yes Carly Arlyce HarmanJ Rivet, MD  pravastatin (  PRAVACHOL) 80 MG tablet TAKE 1 TABLET (80 MG TOTAL) BY MOUTH DAILY. 05/05/15  Yes Carly J Rivet, MD  PROAIR HFA 108 (90 BASE) MCG/ACT inhaler INHALE 2 PUFFS INTO THE LUNGS EVERY 6 (SIX) HOURS AS NEEDED FOR WHEEZING OR SHORTNESS OF BREATH. 11/24/14  Yes Carly Arlyce Harman, MD  SYMBICORT 80-4.5 MCG/ACT inhaler USE 2 SPRAYS TWICE DAILY 08/05/15  Yes Carly Arlyce Harman, MD  warfarin (COUMADIN) 4 MG tablet Take 1/2 tablet on Sundays/Thursdays/Saturdays; take 1 tablet all other days. Patient taking differently:  Take 2-4 mg by mouth daily. Take 1/2 tablet on Sundays/Tuesday/Thursdays/Saturdays; take 1 tablet all other days. 07/27/15  Yes Inez Catalina, MD    Family History History reviewed. No pertinent family history.  Social History Social History  Substance Use Topics  . Smoking status: Former Games developer  . Smokeless tobacco: Not on file  . Alcohol use No     Allergies   Penicillins; Shellfish allergy; and Tomato   Review of Systems Review of Systems  Constitutional: Positive for chills and fever (subjective ).  HENT: Negative for congestion and sore throat.   Eyes: Negative for visual disturbance.  Respiratory: Negative for cough and shortness of breath.   Cardiovascular: Negative for chest pain.  Gastrointestinal: Positive for abdominal pain, diarrhea, nausea and vomiting. Negative for blood in stool.  Genitourinary: Negative for difficulty urinating, dysuria, flank pain, frequency, hematuria, urgency, vaginal bleeding and vaginal discharge.  Musculoskeletal: Negative for back pain and neck pain.  Skin: Negative for rash.  Neurological: Negative for headaches.     Physical Exam Updated Vital Signs BP (!) 99/50 (BP Location: Right Arm)   Pulse 97   Temp 99.3 F (37.4 C) (Oral)   Resp (!) 28   Ht 5\' 3"  (1.6 m)   Wt 127 kg   SpO2 99%   BMI 49.60 kg/m   Physical Exam  Constitutional: She appears well-developed and well-nourished. No distress.  Nontoxic appearing. Obese female.  HENT:  Head: Normocephalic and atraumatic.  Mouth/Throat: Oropharynx is clear and moist.  Eyes: Conjunctivae are normal. Pupils are equal, round, and reactive to light. Right eye exhibits no discharge. Left eye exhibits no discharge.  Neck: Neck supple.  Cardiovascular: Normal rate, regular rhythm, normal heart sounds and intact distal pulses.   Pulmonary/Chest: Effort normal and breath sounds normal. No respiratory distress. She has no wheezes. She has no rales.  Abdominal: Soft. Bowel sounds are  normal. She exhibits no distension and no mass. There is tenderness. There is no rebound and no guarding.  Abdomen is soft. Bowel sounds are present. Patient has mild lower abdominal tenderness to palpation that is worse in her right lower quadrant. No peritoneal signs.  Musculoskeletal: She exhibits no edema or tenderness.  Lymphadenopathy:    She has no cervical adenopathy.  Neurological: She is alert. Coordination normal.  Skin: Skin is warm and dry. Capillary refill takes less than 2 seconds. No rash noted. She is not diaphoretic. No erythema. No pallor.  Psychiatric: She has a normal mood and affect. Her behavior is normal.  Nursing note and vitals reviewed.    ED Treatments / Results  Labs (all labs ordered are listed, but only abnormal results are displayed) Labs Reviewed  COMPREHENSIVE METABOLIC PANEL - Abnormal; Notable for the following:       Result Value   Chloride 99 (*)    Glucose, Bld 198 (*)    Creatinine, Ser 1.24 (*)    GFR calc non Af Amer 44 (*)  GFR calc Af Amer 51 (*)    All other components within normal limits  CBC - Abnormal; Notable for the following:    MCH 24.9 (*)    All other components within normal limits  URINALYSIS, ROUTINE W REFLEX MICROSCOPIC (NOT AT [facility]) - Abnormal; Notable for the following:    Leukocytes, UA SMALL (*)    All other components within normal limits  PROTIME-INR - Abnormal; Notable for the following:    Prothrombin Time 19.7 (*)    All other components within normal limits  URINE MICROSCOPIC-ADD ON - Abnormal; Notable for the following:    Squamous Epithelial / LPF 0-5 (*)    Bacteria, UA RARE (*)    All other components within normal limits  TROPONIN I - Abnormal; Notable for the following:    Troponin I 0.10 (*)    All other components within normal limits  CBC - Abnormal; Notable for the following:    WBC 10.6 (*)    Hemoglobin 10.4 (*)    HCT 35.3 (*)    MCH 24.1 (*)    MCHC 29.5 (*)    Platelets 122 (*)    All  other components within normal limits  LACTIC ACID, PLASMA - Abnormal; Notable for the following:    Lactic Acid, Venous 4.8 (*)    All other components within normal limits  GLUCOSE, CAPILLARY - Abnormal; Notable for the following:    Glucose-Capillary 177 (*)    All other components within normal limits  I-STAT CG4 LACTIC ACID, ED - Abnormal; Notable for the following:    Lactic Acid, Venous 6.44 (*)    All other components within normal limits  I-STAT CG4 LACTIC ACID, ED - Abnormal; Notable for the following:    Lactic Acid, Venous 5.05 (*)    All other components within normal limits  URINE CULTURE  CULTURE, BLOOD (ROUTINE X 2)  CULTURE, BLOOD (ROUTINE X 2)  LIPASE, BLOOD  CORTISOL  URINALYSIS, ROUTINE W REFLEX MICROSCOPIC (NOT AT [facility])  PROCALCITONIN  STREP PNEUMONIAE URINARY ANTIGEN  TYPE AND SCREEN  ABO/RH    EKG  EKG Interpretation  Date/Time:  Wednesday September 16 2015 05:40:29 EDT Ventricular Rate:  107 PR Interval:    QRS Duration: 138 QT Interval:  358 QTC Calculation: 478 R Axis:   -66 Text Interpretation:  Sinus tachycardia Right bundle branch block Left anterior fasicular block LVH with IVCD and secondary repol abnrm Non-specific ST-t changes No significant change since last tracing Confirmed by Denton Lank  MD, Caryn Bee ([id number]) on 09/16/2015 7:17:21 AM       Radiology Ct Abdomen Pelvis W Contrast  Result Date: 09/16/2015 CLINICAL DATA:  Right lower quadrant abdominal pain. Nausea vomiting diarrhea EXAM: CT ABDOMEN AND PELVIS WITH CONTRAST TECHNIQUE: Multidetector CT imaging of the abdomen and pelvis was performed using the standard protocol following bolus administration of intravenous contrast. CONTRAST:  ISOVUE-300 IOPAMIDOL (ISOVUE-300) INJECTION 61% COMPARISON:  None. FINDINGS: Lower chest: Mild bibasilar atelectasis. Cardiac enlargement. No pleural effusion. Hepatobiliary: No focal liver lesion. Liver density within normal limits. Gallbladder and bile  ducts normal. Pancreas: Negative Spleen: Negative Adrenals/Urinary Tract: Right-sided hydronephrosis and hydroureter. Ureter is dilated down to a stone at the iliac crossing. Obstructing stone measures 4 x 8 mm. Additional calculi in the right renal calices. The largest stone in the right lower pole measures 8 mm. There is perinephric edema. There is a 5 mm calcification in the retroperitoneum on the right near the proximal ureter which appears to  be a calcified lymph node and appears outside of the ureter. No left renal calculi or hydronephrosis. Left upper pole 15 mm cyst. Urinary bladder normal. Stomach/Bowel: Stomach and duodenum normal. Negative for small bowel obstruction. No bowel edema. Sigmoid diverticulosis without changes of diverticulitis. Vascular/Lymphatic: Mild atherosclerotic calcification in the aorta and iliac arteries. No aneurysm. No lymphadenopathy Reproductive: Hysterectomy.  No pelvic mass. Other: No free fluid. Negative for hernia. Image quality degraded by motion. Musculoskeletal: Lumbar disc and facet degeneration L4-5 and L5-S1. Grade 1 anterior slip L4-5. No acute skeletal abnormality. IMPRESSION: Moderate right hydronephrosis. 4 x 8 mm obstructing stone distal right ureter at the level of the iliac crossing. Additional nonobstructing stones in the right kidney. Note is made of a 5 mm calcification near the right ureter proximally which is felt to be outside of the renal collecting system. Electronically Signed   By: Marlan Palau M.D.   On: 09/16/2015 09:29   Dg Cystogram  Result Date: 09/16/2015 CLINICAL DATA:  70 year old female undergoing right-sided ureteral stent placement EXAM: CYSTOGRAM - 3+ VIEW CONTRAST:  Please see operative report for detail FLUOROSCOPY TIME:  Fluoroscopy Time:  1 minutes 27 seconds Number of Acquired Spot Images: 0 COMPARISON:  CT abdomen/ pelvis 09/16/2015 FINDINGS: 2 intraoperative spot images demonstrate placement of a right-sided double-J ureteral  stent. The proximal loop is appropriately reconstituted and overlies and upper pole calyx. The lower loop is partially reconstituted and overlies the bladder. Partial contrast opacification of the right renal collecting system demonstrates mild hydronephrosis. IMPRESSION: 1. Mild right hydronephrosis. 2. Right-sided double-J ureteral stent. Electronically Signed   By: Malachy Moan M.D.   On: 09/16/2015 14:45   Dg Chest Port 1 View  Result Date: 09/16/2015 CLINICAL DATA:  Nausea and diarrhea. EXAM: PORTABLE CHEST 1 VIEW COMPARISON:  03/20/2015. FINDINGS: Cardiomegaly with bilateral pulmonary interstitial prominence consistent congestive heart failure. Pneumonitis cannot be excluded. Low lung volumes with bibasilar atelectasis . No prominent pleural effusion or pneumothorax . IMPRESSION: 1. Cardiomegaly with bilateral from interstitial prominence consistent with congestive heart failure. Pneumonitis cannot be excluded. 2. Low lung volumes with bibasilar atelectasis. Electronically Signed   By: Maisie Fus  Register   On: 09/16/2015 09:39    Procedures Procedures (including critical care time)  Medications Ordered in ED Medications  ceFEPIme (MAXIPIME) 2 g in dextrose 5 % 50 mL IVPB ( Intravenous MAR Unhold 09/16/15 1549)  vancomycin (VANCOCIN) 1,500 mg in sodium chloride 0.9 % 500 mL IVPB ( Intravenous MAR Unhold 09/16/15 1549)  phenylephrine (NEO-SYNEPHRINE) 10 mg in dextrose 5 % 250 mL (0.04 mg/mL) infusion ( Intravenous MAR Unhold 09/16/15 1549)  0.9 %  sodium chloride infusion (not administered)  fentaNYL (SUBLIMAZE) injection 25-100 mcg ( Intravenous MAR Unhold 09/16/15 1549)  lactated ringers infusion ( Intravenous New Bag/Given 09/16/15 1245)  promethazine (PHENERGAN) injection 6.25-12.5 mg (not administered)  acetaminophen (TYLENOL) tablet 1,000 mg (not administered)  sodium chloride 0.9 % bolus 1,000 mL (0 mLs Intravenous Stopped 09/16/15 1012)  ondansetron (ZOFRAN) injection 4 mg (4 mg Intravenous  Given 09/16/15 0719)  morphine 4 MG/ML injection 4 mg (4 mg Intravenous Given 09/16/15 0720)  iopamidol (ISOVUE-300) 61 % injection (100 mLs  Contrast Given 09/16/15 0854)  sodium chloride 0.9 % bolus 1,000 mL (1,000 mLs Intravenous New Bag/Given 09/16/15 0927)  acetaminophen (TYLENOL) tablet 650 mg (650 mg Oral Given 09/16/15 0945)  sodium chloride 0.9 % bolus 1,000 mL (1,000 mLs Intravenous New Bag/Given 09/16/15 1012)  sodium chloride 0.9 % bolus 1,000 mL (1,000 mLs  Intravenous New Bag/Given 09/16/15 1011)  ceFEPIme (MAXIPIME) 2 g in dextrose 5 % 50 mL IVPB (0 g Intravenous Stopped 09/16/15 0957)  vancomycin (VANCOCIN) 2,000 mg in sodium chloride 0.9 % 500 mL IVPB (2,000 mg Intravenous New Bag/Given 09/16/15 1012)  fentaNYL (SUBLIMAZE) injection 50 mcg (50 mcg Intravenous Given 09/16/15 1016)     Initial Impression / Assessment and Plan / ED Course  I have reviewed the triage vital signs and the nursing notes.  Pertinent labs & imaging results that were available during my care of the patient were reviewed by me and considered in my medical decision making (see chart for details).  Clinical Course   09:10 Repeat sepsis assessment completed.  10:10 Repeat sepsis assessment completed.     Patient presented to the emergency department complaining of nausea, vomiting and diarrhea as well as bilateral lower abdominal pain that is worse in her right lower quadrant. On arrival the patient had an oral temperature of 99.5. On exam she is nontoxic appearing. She feels warm to touch. I requested a rectal temperature. She has bilateral lower abdominal tenderness that is worse on her right lower quadrant.  Rectal temperature returned elevated at 101.2. Code sepsis was activated. Sepsis orders placed.  Lactic returned initially at 6.44. 30 mL per KG fluid bolus was ordered. Antibiotics for unknown source weren't ordered. Plan is for chest x-ray and CT abdomen and pelvis.  CMP reveals a mildly elevated creatinine of  1.24. This is a bump from her baseline.  CT abdomen and pelvis reveals moderate right hydronephrosis with an obstructing stone at her distal right ureter.  I consulted urologist Dr. Marnee Guarneri. He will be by to see the patient. He plans to do a percutaneous stent placement. He asks if I can consult IR to have them ready if needed.   I consulted with IR Dr. Grace Isaac who would like Dr. Marnee Guarneri to page him at [phone] if needed.   I consulted with Dr. Ignacia Marvel from [facility]. He will be by to see patient.   Dr. Marnee Guarneri plans to take patient to OR for percutaneous stent placement.  Critical care medicine plans to admit patient to ICU. Second lactic returned improved at 5.05.  Patient transported to the OR and admitted to ICU.  This patient was discussed with and evaluated by Dr. Denton Lank who agrees with assessment and plan.   Final Clinical Impressions(s) / ED Diagnoses   Final diagnoses:  Nausea vomiting and diarrhea  Sepsis, due to unspecified organism [facility])  Right ureteral stone    New Prescriptions Current Discharge Medication List      CRITICAL CARE Performed by: Lawana Chambers   Total critical care time: 50 minutes  Critical care time was exclusive of separately billable procedures and treating other patients.  Critical care was necessary to treat or prevent imminent or life-threatening deterioration.  Critical care was time spent personally by me on the following activities: development of treatment plan with patient and/or surrogate as well as nursing, discussions with consultants, evaluation of patient's response to treatment, examination of patient, obtaining history from patient or surrogate, ordering and performing treatments and interventions, ordering and review of laboratory studies, ordering and review of radiographic studies, pulse oximetry and re-evaluation of patient's condition.    Everlene Farrier, PA-C 09/16/15 1719    Cathren Laine, MD 09/19/15 [phone]

## 2015-09-16 NOTE — Progress Notes (Signed)
Pt arrived from the PACU. Alert, oriented, denies pain. Troponin 0.10 and Lactic acid 4.8 was reported to Elink. VS stable, family at the bediside

## 2015-09-16 NOTE — H&P (Signed)
PULMONARY / CRITICAL CARE MEDICINE   Name: Caroline [name] MRN: [id number] DOB: 05-03-1945    ADMISSION DATE:  09/16/2015   REFERRING MD:  EDP  CHIEF COMPLAINT: Abd pain  HISTORY OF PRESENT ILLNESS:   70 yo AAF with 3 month hx of right real calculi with on going flank pain. 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 She presents 9/6 with 72hours of increasing rt flank pain, vomiting, diarrhea, and with fever 102 rectal. CT scan abd reveals rt hydronephrosis and obstructing stone rt. She has had 4 litre of IVF and has sbp 109. PCCM asked to admit to ICU.  PAST MEDICAL HISTORY :  She  has a past medical history of Asthma; Diabetes mellitus; DVT, lower extremity, recurrent (HCC); Hypertension; PE (pulmonary embolism); and Vertigo.  PAST SURGICAL HISTORY: She  has no past surgical history on file.  Allergies  Allergen Reactions  . Penicillins Swelling  . Shellfish Allergy Swelling  . Tomato Swelling    No current facility-administered medications on file prior to encounter.    Current Outpatient Prescriptions on File Prior to Encounter  Medication Sig  . acetaminophen (TYLENOL) 500 MG tablet Take 500 mg by mouth every 6 (six) hours as needed for mild pain or moderate pain. pain  . calcium citrate-vitamin D (CITRACAL+D) 315-200 MG-UNIT tablet Take 2 tablets by mouth daily.  . hydrochlorothiazide (HYDRODIURIL) 25 MG tablet Take 1 tablet (25 mg total) by mouth daily.  . hydrocortisone (PROCTOSOL HC) 2.5 % rectal cream PLACE 1 APPLICATION RECTALLY 2 (TWO) TIMES DAILY.  . meclizine (ANTIVERT) 25 MG tablet TAKE 1 TABLET (25 MG TOTAL) BY MOUTH 3 (THREE) TIMES DAILY AS NEEDED.  . metoprolol tartrate (LOPRESSOR) 25 MG tablet TAKE 1 TABLET (25 MG TOTAL) BY MOUTH 2 (TWO) TIMES DAILY.  . montelukast (SINGULAIR) 10 MG tablet Take 1 tablet (10 mg total) by mouth at bedtime.  [name] omeprazole (PRILOSEC) 20 MG capsule TAKE 1 CAPSULE (20 MG TOTAL) BY MOUTH DAILY.  (Patient taking differently: TAKE 1 CAPSULE (20 MG TOTAL) BY MOUTH AS NEEDED FOR ACID REFLUX)  . pravastatin (PRAVACHOL) 80 MG tablet TAKE 1 TABLET (80 MG TOTAL) BY MOUTH DAILY.  [name] PROAIR HFA 108 (90 BASE) MCG/ACT inhaler INHALE 2 PUFFS INTO THE LUNGS EVERY 6 (SIX) HOURS AS NEEDED FOR WHEEZING OR SHORTNESS OF BREATH.  . SYMBICORT 80-4.5 MCG/ACT inhaler USE 2 SPRAYS TWICE DAILY  . warfarin (COUMADIN) 4 MG tablet Take 1/2 tablet on Sundays/Thursdays/Saturdays; take 1 tablet all other days. (Patient taking differently: Take 2-4 mg by mouth daily. Take 1/2 tablet on Sundays/Tuesday/Thursdays/Saturdays; take 1 tablet all other days.)    FAMILY HISTORY:  Her has no family status information on file.    SOCIAL HISTORY: She  reports that she has quit smoking. She does not have any smokeless tobacco history on file. She reports that she does not drink alcohol or use drugs.  REVIEW OF SYSTEMS:   10 point review of system taken, please see HPI for positives and negatives.   SUBJECTIVE:  Awake but ill appearing   VITAL SIGNS: BP (!) 117/49   Pulse 116   Temp 101.2 F (38.4 C) (Rectal)   Resp (!) 32   Ht 5\' 3"  (1.6 m)   Wt 280 lb (127 kg)   SpO2 95%   BMI 49.60 kg/m   HEMODYNAMICS:    VENTILATOR SETTINGS:    INTAKE / OUTPUT: No intake/output data recorded.  PHYSICAL EXAMINATION: General:  Obese AAF, awake and alert Neuro:  Intact HEENT:No JVD/LAN, oral mucosa dry Cardiovascular:  HSR RRR Lung CTA Abdomen:  Obese, rt flank tender, +bs Musculoskeletal:  intact Skin: ho and dry  LABS:  BMET  Recent Labs Lab 09/16/15 0538  NA 140  K 3.5  CL 99*  CO2 26  BUN 19  CREATININE 1.24*  GLUCOSE 198*    Electrolytes  Recent Labs Lab 09/16/15 0538  CALCIUM 9.0    CBC  Recent Labs Lab 09/16/15 0538  WBC 9.4  HGB 12.4  HCT 39.6  PLT 154    Coag's  Recent Labs Lab 09/16/15 0714  INR 1.65    Sepsis Markers  Recent Labs Lab 09/16/15 0840  09/16/15 1113  LATICACIDVEN 6.44* 5.05*    ABG No results for input(s): PHART, PCO2ART, PO2ART in the last 168 hours.  Liver Enzymes  Recent Labs Lab 09/16/15 0538  AST 27  ALT 25  ALKPHOS 75  BILITOT 0.6  ALBUMIN 3.7    Cardiac Enzymes No results for input(s): TROPONINI, PROBNP in the last 168 hours.  Glucose No results for input(s): GLUCAP in the last 168 hours.  Imaging Ct Abdomen Pelvis W Contrast  Result Date: 09/16/2015 CLINICAL DATA:  Right lower quadrant abdominal pain. Nausea vomiting diarrhea EXAM: CT ABDOMEN AND PELVIS WITH CONTRAST TECHNIQUE: Multidetector CT imaging of the abdomen and pelvis was performed using the standard protocol following bolus administration of intravenous contrast. CONTRAST:  100mL ISOVUE-300 IOPAMIDOL (ISOVUE-300) INJECTION 61% COMPARISON:  None. FINDINGS: Lower chest: Mild bibasilar atelectasis. Cardiac enlargement. No pleural effusion. Hepatobiliary: No focal liver lesion. Liver density within normal limits. Gallbladder and bile ducts normal. Pancreas: Negative Spleen: Negative Adrenals/Urinary Tract: Right-sided hydronephrosis and hydroureter. Ureter is dilated down to a stone at the iliac crossing. Obstructing stone measures 4 x 8 mm. Additional calculi in the right renal calices. The largest stone in the right lower pole measures 8 mm. There is perinephric edema. There is a 5 mm calcification in the retroperitoneum on the right near the proximal ureter which appears to be a calcified lymph node and appears outside of the ureter. No left renal calculi or hydronephrosis. Left upper pole 15 mm cyst. Urinary bladder normal. Stomach/Bowel: Stomach and duodenum normal. Negative for small bowel obstruction. No bowel edema. Sigmoid diverticulosis without changes of diverticulitis. Vascular/Lymphatic: Mild atherosclerotic calcification in the aorta and iliac arteries. No aneurysm. No lymphadenopathy Reproductive: Hysterectomy.  No pelvic mass. Other: No  free fluid. Negative for hernia. Image quality degraded by motion. Musculoskeletal: Lumbar disc and facet degeneration L4-5 and L5-S1. Grade 1 anterior slip L4-5. No acute skeletal abnormality. IMPRESSION: Moderate right hydronephrosis. 4 x 8 mm obstructing stone distal right ureter at the level of the iliac crossing. Additional nonobstructing stones in the right kidney. Note is made of a 5 mm calcification near the right ureter proximally which is felt to be outside of the renal collecting system. Electronically Signed   By: Marlan Palauharles  Clark M.D.   On: 09/16/2015 09:29   Dg Chest Port 1 View  Result Date: 09/16/2015 CLINICAL DATA:  Nausea and diarrhea. EXAM: PORTABLE CHEST 1 VIEW COMPARISON:  03/20/2015. FINDINGS: Cardiomegaly with bilateral pulmonary interstitial prominence consistent congestive heart failure. Pneumonitis cannot be excluded. Low lung volumes with bibasilar atelectasis . No prominent pleural effusion or pneumothorax . IMPRESSION: 1. Cardiomegaly with bilateral from interstitial prominence consistent with congestive heart failure. Pneumonitis cannot be excluded. 2. Low lung volumes with bibasilar atelectasis. Electronically Signed   By: Maisie Fushomas  Register   On: 09/16/2015 09:39     STUDIES:  9/6 CT abd with rt renal calculi and hydronephrosis  CULTURES: 9/6 bc x 2>> 9/6 uc>> 9/6 procal>>  ANTIBIOTICS: 9/6 vanc>> 8/6 cefepime>>  SIGNIFICANT EVENTS: 9/6 admit to ICU. 9/6 urology consult  LINES/TUBES: None   DISCUSSION: 70 yo AAF with 3 month hx of right real calculi with on going flank pain. 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 She presents 9/6 with 72hours of increasing rt flank pain, vomiting, diarrhea, and with fever 102 rectal. CT scan abd reveals rt hydronephrosis and obstructing stone rt. She has had 4 litre of IVF and has sbp 109. PCCM asked to admit to ICU.  ASSESSMENT / PLAN:  PULMONARY A: No acute issue P:   O2 as  needed  CARDIOVASCULAR A:  Hx of Htn Shock from sepsis(lactic acid 5.0) P:  DC antihypertensives Fluid resuscitation (post 4 l ivf) Recheck lactic acid Abx for presumed renal infection Needs perq drain for hydronephrosis    RENAL Lab Results  Component Value Date   CREATININE 1.24 (H) 09/16/2015   CREATININE 0.87 03/20/2015   CREATININE 0.74 07/01/2014   CREATININE 0.73 04/08/2014   CREATININE 0.68 02/19/2013   CREATININE 0.79 11/20/2012    A:   Renal failure secondary to right hydronephrosis  P:   IVF Will need perq drain  GASTROINTESTINAL A:   GI protection  P:   ppi  HEMATOLOGIC A:   Chronic anticoagulation with coumadin for remote PE P:  Will need 2 FFP's for invasive procedures  INFECTIOUS A:   Presumed sepsis from renal stone and hydronephrosis  P:   Sepsis protocol Vanc /Cefepimine (pcn all)  ENDOCRINE A:   No acute issue P:     NEUROLOGIC A:   NO acute issues. Awake and alert P:   RASS goal: 0 Monitor for ICU delirium    FAMILY  - Updates: Large family updated by S. Minor ACNP and David Stall MD.  - Inter-disciplinary family meet or Palliative Care meeting due by:  Sept 13    [facility] Minor ACNP Adolph Pollack PCCM Pager [phone] till 3 pm If no answer page [phone] 09/16/2015, 11:24 AM  Attending Note:  70 year old female with PMH above who presents with septic shock and stone induced obstructive uropathy.  Urology saw patient and they are calling IR to place a drain.  On exam, abdominal and flank pain noted with clear lungs.  I reviewed CT myself, stone noted.  Will admit to the ICU, start sepsis protocol, pan culture, cefepime and vancomycin started, no central line for now since lactic acid is dropping and patient is no longer hypotensive.  Will monitor closely in the ICU.  The patient is critically ill with multiple organ systems failure and requires high complexity decision making for assessment and support, frequent evaluation and  titration of therapies, application of advanced monitoring technologies and extensive interpretation of multiple databases.   Critical Care Time devoted to patient care services described in this note is  35  Minutes. This time reflects time of care of this signee Dr Koren Bound. This critical care time does not reflect procedure time, or teaching time or supervisory time of PA/NP/Med student/Med Resident etc but could involve care discussion time.  Alyson Reedy, M.D. [facility] Pulmonary/Critical Care Medicine. Pager: [phone]. After hours pager: [phone].

## 2015-09-16 NOTE — Anesthesia Postprocedure Evaluation (Signed)
Anesthesia Post Note  Patient: Caroline [name]  Procedure(s) Performed: Procedure(s) (LRB): CYSTOSCOPY WITH RETROGRADE PYELOGRAM/URETERAL STENT PLACEMENT (Right)  Patient location during evaluation: PACU Anesthesia Type: General Level of consciousness: awake and alert Pain management: pain level controlled Vital Signs Assessment: post-procedure vital signs reviewed and stable Respiratory status: spontaneous breathing, nonlabored ventilation, respiratory function stable and patient connected to nasal cannula oxygen Cardiovascular status: blood pressure returned to baseline and stable Postop Assessment: no signs of nausea or vomiting Anesthetic complications: no    Last Vitals:  Vitals:   09/16/15 1415 09/16/15 1430  BP: (!) 92/56 (!) 90/49  Pulse: (!) 102 (!) 101  Resp: (!) 28 (!) 44  Temp:      Last Pain:  Vitals:   09/16/15 1430  TempSrc:   PainSc: 0-No pain                 Linton Rump

## 2015-09-16 NOTE — Consult Note (Signed)
Urology Consult  Referring physician: Rosine Abe Reason for referral: Septic stone  Chief Complaint: Septic Stone  History of Present Illness: Verbal on phone: Asked to assess code sepsis patient; Febrile 101[; elevated lactic acid; HR elevated and a bit hypotensive; Cr 1.24; obstructing stone on CT scan; started on iv antibiotics; on coumadin INR 1.64 Patient c/o mid abdominal pain and rt lower abdominal pain and rt flank pain for days/months No stone history; no UTI history No GU surgery On coumadin for lung embili  Modifying factors: There are no other modifying factors  Associated signs and symptoms: There are no other associated signs and symptoms Aggravating and relieving factors: There are no other aggravating or relieving factors Severity: Moderate Duration: Persistent   Past Medical History:  Diagnosis Date  . Asthma   . Diabetes mellitus   . DVT, lower extremity, recurrent ([place])    Patient had unprovoked PE on 2002 and DVT in right lower extremety 2008.  [name] Hypertension   . PE (pulmonary embolism)    Patient had unprovoked PE on 2002  . Vertigo    No past surgical history on file.  Medications: I have reviewed the patient's current medications. Allergies:  Allergies  Allergen Reactions  . Penicillins Swelling  . Shellfish Allergy Swelling  . Tomato Swelling    No family history on file. Social History:  reports that she has quit smoking. She does not have any smokeless tobacco history on file. She reports that she does not drink alcohol or use drugs.  ROS: All systems are reviewed and negative except as noted. Rest negative  Physical Exam:  Vital signs in last 24 hours: Temp:  [99.5 F (37.5 C)-101.2 F (38.4 C)] 101.2 F (38.4 C) (09/06 0807) Pulse Rate:  [104-125] 116 (09/06 1045) Resp:  [16-45] 32 (09/06 1045) BP: (100-152)/(49-97) 117/49 (09/06 1045) SpO2:  [89 %-96 %] 95 % (09/06 1045) Weight:  [127 kg (280 lb)] 127 kg (280 lb) (09/06  0505)  Cardiovascular: Skin warm; not flushed Respiratory: Breaths quiet; no shortness of breath Abdomen: No masses Neurological: Normal sensation to touch Musculoskeletal: Normal motor function arms and legs Lymphatics: No inguinal adenopathy Skin: No rashes Genitourinary:looks ill; obese; no CVA tender; mild rt lower abdominal tender  Laboratory Data:  Results for orders placed or performed during the hospital encounter of 09/16/15 (from the past 72 hour(s))  Lipase, blood     Status: None   Collection Time: 09/16/15  5:38 AM  Result Value Ref Range   Lipase 36 11 - 51 U/L  Comprehensive metabolic panel     Status: Abnormal   Collection Time: 09/16/15  5:38 AM  Result Value Ref Range   Sodium 140 135 - 145 mmol/L   Potassium 3.5 3.5 - 5.1 mmol/L   Chloride 99 (L) 101 - 111 mmol/L   CO2 26 22 - 32 mmol/L   Glucose, Bld 198 (H) 65 - 99 mg/dL   BUN 19 6 - 20 mg/dL   Creatinine, Ser 1.24 (H) 0.44 - 1.00 mg/dL   Calcium 9.0 8.9 - 10.3 mg/dL   Total Protein 7.5 6.5 - 8.1 g/dL   Albumin 3.7 3.5 - 5.0 g/dL   AST 27 15 - 41 U/L   ALT 25 14 - 54 U/L   Alkaline Phosphatase 75 38 - 126 U/L   Total Bilirubin 0.6 0.3 - 1.2 mg/dL   GFR calc non Af Amer 44 (L) >60 mL/min   GFR calc Af Amer 51 (L) >60 mL/min  Comment: (NOTE) The eGFR has been calculated using the CKD EPI equation. This calculation has not been validated in all clinical situations. eGFR's persistently <60 mL/min signify possible Chronic Kidney Disease.    Anion gap 15 5 - 15  CBC     Status: Abnormal   Collection Time: 09/16/15  5:38 AM  Result Value Ref Range   WBC 9.4 4.0 - 10.5 K/uL   RBC 4.98 3.87 - 5.11 MIL/uL   Hemoglobin 12.4 12.0 - 15.0 g/dL   HCT 39.6 36.0 - 46.0 %   MCV 79.5 78.0 - 100.0 fL   MCH 24.9 (L) 26.0 - 34.0 pg   MCHC 31.3 30.0 - 36.0 g/dL   RDW 14.4 11.5 - 15.5 %   Platelets 154 150 - 400 K/uL  Urinalysis, Routine w reflex microscopic     Status: Abnormal   Collection Time: 09/16/15  5:54  AM  Result Value Ref Range   Color, Urine YELLOW YELLOW   APPearance CLEAR CLEAR   Specific Gravity, Urine 1.014 1.005 - 1.030   pH 7.5 5.0 - 8.0   Glucose, UA NEGATIVE NEGATIVE mg/dL   Hgb urine dipstick NEGATIVE NEGATIVE   Bilirubin Urine NEGATIVE NEGATIVE   Ketones, ur NEGATIVE NEGATIVE mg/dL   Protein, ur NEGATIVE NEGATIVE mg/dL   Nitrite NEGATIVE NEGATIVE   Leukocytes, UA SMALL (A) NEGATIVE  Urine microscopic-add on     Status: Abnormal   Collection Time: 09/16/15  5:54 AM  Result Value Ref Range   Squamous Epithelial / LPF 0-5 (A) NONE SEEN   WBC, UA 0-5 0 - 5 WBC/hpf   RBC / HPF 0-5 0 - 5 RBC/hpf   Bacteria, UA RARE (A) NONE SEEN  Protime-INR     Status: Abnormal   Collection Time: 09/16/15  7:14 AM  Result Value Ref Range   Prothrombin Time 19.7 (H) 11.4 - 15.2 seconds   INR 1.65   I-Stat CG4 Lactic Acid, ED  (not at  [facility])     Status: Abnormal   Collection Time: 09/16/15  8:40 AM  Result Value Ref Range   Lactic Acid, Venous 6.44 (HH) 0.5 - 1.9 mmol/L   Comment NOTIFIED PHYSICIAN    No results found for this or any previous visit (from the past 240 hour(s)). Creatinine:  Recent Labs  09/16/15 0538  CREATININE 1.24*    Xrays: See report/chart Reviewed findings and agree  Impression/Assessment:  Right ureteral stone with sepsis  Plan:  Spoke to entire team: ICU/ER/Interventional Picture drawn I was leaning towards a perc tube but based on obesity/coumadin/team discussions recommended stent with retrograde; OR can do it in less than 60 minutes; pros and cons and risks discussed; risk or intra and post op sepsis and sequelae discussed; role of failure and perc tube discussed After a thorough review of the management options for the patient's condition the patient  elected to proceed with surgical therapy as noted above. We have discussed the potential benefits and risks of the procedure, side effects of the proposed treatment, the likelihood of the patient  achieving the goals of the procedure, and any potential problems that might occur during the procedure or recuperation. Informed consent has been obtained.  Ardice Boyan A 09/16/2015, 11:03 AM

## 2015-09-16 NOTE — ED Triage Notes (Signed)
Patient arrives with complaint of nausea and diarrhea with onset last night around 2030. Explains that the diarrhea developed into vomiting and abdominal pain. Denies sick contacts, history of abd surgery, or chronic occurrence of similar symptoms.

## 2015-09-17 ENCOUNTER — Encounter (HOSPITAL_COMMUNITY): Payer: Self-pay | Admitting: General Practice

## 2015-09-17 DIAGNOSIS — N201 Calculus of ureter: Secondary | ICD-10-CM

## 2015-09-17 DIAGNOSIS — Z419 Encounter for procedure for purposes other than remedying health state, unspecified: Secondary | ICD-10-CM

## 2015-09-17 LAB — BASIC METABOLIC PANEL
ANION GAP: 8 (ref 5–15)
BUN: 19 mg/dL (ref 6–20)
CHLORIDE: 111 mmol/L (ref 101–111)
CO2: 21 mmol/L — AB (ref 22–32)
CREATININE: 1.24 mg/dL — AB (ref 0.44–1.00)
Calcium: 7.2 mg/dL — ABNORMAL LOW (ref 8.9–10.3)
GFR calc Af Amer: 51 mL/min — ABNORMAL LOW (ref >60)
GFR calc non Af Amer: 44 mL/min — ABNORMAL LOW (ref >60)
Glucose, Bld: 176 mg/dL — ABNORMAL HIGH (ref 65–99)
POTASSIUM: 3.9 mmol/L (ref 3.5–5.1)
Sodium: 140 mmol/L (ref 135–145)

## 2015-09-17 LAB — CBC
HEMATOCRIT: 33.7 % — AB (ref 36.0–46.0)
HEMOGLOBIN: 10.3 g/dL — AB (ref 12.0–15.0)
MCH: 24.9 pg — ABNORMAL LOW (ref 26.0–34.0)
MCHC: 30.6 g/dL (ref 30.0–36.0)
MCV: 81.4 fL (ref 78.0–100.0)
Platelets: 140 10*3/uL — ABNORMAL LOW (ref 150–400)
RBC: 4.14 MIL/uL (ref 3.87–5.11)
RDW: 15.5 % (ref 11.5–15.5)
WBC: 22.1 10*3/uL — ABNORMAL HIGH (ref 4.0–10.5)

## 2015-09-17 LAB — GLUCOSE, CAPILLARY: GLUCOSE-CAPILLARY: 163 mg/dL — AB (ref 65–99)

## 2015-09-17 LAB — URINE CULTURE

## 2015-09-17 LAB — PROCALCITONIN: Procalcitonin: 45.4 ng/mL

## 2015-09-17 LAB — LACTIC ACID, PLASMA
LACTIC ACID, VENOUS: 1.7 mmol/L (ref 0.5–1.9)
Lactic Acid, Venous: 2.7 mmol/L (ref 0.5–1.9)

## 2015-09-17 LAB — HEPARIN LEVEL (UNFRACTIONATED): HEPARIN UNFRACTIONATED: 0.14 [IU]/mL — AB (ref 0.30–0.70)

## 2015-09-17 MED ORDER — PANTOPRAZOLE SODIUM 40 MG PO TBEC
40.0000 mg | DELAYED_RELEASE_TABLET | Freq: Every day | ORAL | Status: DC
  Administered 2015-09-18 – 2015-09-23 (×6): 40 mg via ORAL
  Filled 2015-09-17 (×6): qty 1

## 2015-09-17 MED ORDER — HEPARIN (PORCINE) IN NACL 100-0.45 UNIT/ML-% IJ SOLN
2200.0000 [IU]/h | INTRAMUSCULAR | Status: DC
  Administered 2015-09-17: 1450 [IU]/h via INTRAVENOUS
  Administered 2015-09-18: 1900 [IU]/h via INTRAVENOUS
  Administered 2015-09-18: 1650 [IU]/h via INTRAVENOUS
  Administered 2015-09-19 – 2015-09-20 (×3): 1900 [IU]/h via INTRAVENOUS
  Filled 2015-09-17 (×8): qty 250

## 2015-09-17 MED ORDER — ACETAMINOPHEN 325 MG PO TABS
650.0000 mg | ORAL_TABLET | Freq: Four times a day (QID) | ORAL | Status: DC | PRN
  Administered 2015-09-17 – 2015-09-21 (×10): 650 mg via ORAL
  Filled 2015-09-17 (×9): qty 2

## 2015-09-17 MED ORDER — CEFEPIME HCL 2 G IJ SOLR
2.0000 g | Freq: Three times a day (TID) | INTRAMUSCULAR | Status: DC
Start: 2015-09-17 — End: 2015-09-18
  Administered 2015-09-17 – 2015-09-18 (×4): 2 g via INTRAVENOUS
  Filled 2015-09-17 (×6): qty 2

## 2015-09-17 NOTE — Progress Notes (Signed)
Spoke with nurse and patient Pain improved Very good output Labs abnormal and noted Name and phone number given to see me in about two weeks in office Please call me if any issues

## 2015-09-17 NOTE — Progress Notes (Signed)
ANTICOAGULATION CONSULT NOTE - Initial Consult  Pharmacy Consult for heparin Indication: h/o DVT  Allergies  Allergen Reactions  . Penicillins Swelling  . Shellfish Allergy Swelling  . Tomato Swelling    Patient Measurements: Height: 5\' 3"  (160 cm) Weight: (!) 301 lb 2.4 oz (136.6 kg) IBW/kg (Calculated) : 52.4 Heparin Dosing Weight: 86 kg  Vital Signs: Temp: 99.9 F (37.7 C) (09/07 1134) Temp Source: Oral (09/07 1134) BP: 113/52 (09/07 1215) Pulse Rate: 96 (09/07 1215)  Labs:  Recent Labs  09/16/15 0538 09/16/15 0714 09/16/15 1435 09/17/15 0205  HGB 12.4  --  10.4* 10.3*  HCT 39.6  --  35.3* 33.7*  PLT 154  --  122* 140*  LABPROT  --  19.7*  --   --   INR  --  1.65  --   --   CREATININE 1.24*  --   --  1.24*  TROPONINI  --   --  0.10*  --     Estimated Creatinine Clearance: 59 mL/min (by C-G formula based on SCr of 1.24 mg/dL).   Medical History: Past Medical History:  Diagnosis Date  . Asthma   . Diabetes mellitus   . DVT, lower extremity, recurrent (HCC)    Patient had unprovoked PE on 2002 and DVT in right lower extremety 2008.  [name] Hypertension   . PE (pulmonary embolism)    Patient had unprovoked PE on 2002  . Vertigo     Assessment: 70yo F on warfarin PTA for h/o DVT. INR 1.24, Hgb 10.3 stable, Plt 140, no bleeding noted  PTA Warfarin Dose: 4mg  MWF and 2mg  AODs  Goal of Therapy:  Heparin level 0.3-0.7 units/ml Monitor platelets by anticoagulation protocol: Yes   Plan:  Start heparin infusion at 1450 units/hr Check anti-Xa level in 6 hours and daily while on heparin Continue to monitor H&H and platelets    Mackie Pai, PharmD PGY1 Pharmacy Resident Pager: [phone] 09/17/2015 12:44 PM

## 2015-09-17 NOTE — Progress Notes (Signed)
Pharmacy Antibiotic Note  Caroline [name] is a 70 y.o. female admitted on 09/16/2015 with sepsis 2/2 right ureteral stone.  Pharmacy has been consulted for cefepime and vancomycin dosing. Documented PCN allergy, but tolerating cefepime. WBC 22.1, afebrile, CrCl ~ 59 ml/min  Plan: -d/c vancomycin -change cefepime to 2g IV every 8 hours -total duration 8 days per Dr. Tyson Alias -monitor renal function, clinical status  Height: 5\' 3"  (160 cm) Weight: (!) 301 lb 2.4 oz (136.6 kg) IBW/kg (Calculated) : 52.4  Temp (24hrs), Avg:99.3 F (37.4 C), Min:98.8 F (37.1 C), Max:99.9 F (37.7 C)   Recent Labs Lab 09/16/15 0538 09/16/15 0840 09/16/15 1113 09/16/15 1435 09/16/15 2019 09/16/15 2325 09/17/15 0205  WBC 9.4  --   --  10.6*  --   --  22.1*  CREATININE 1.24*  --   --   --   --   --  1.24*  LATICACIDVEN  --  6.44* 5.05* 4.8* 2.6* 2.7*  --     Estimated Creatinine Clearance: 59 mL/min (by C-G formula based on SCr of 1.24 mg/dL).    Allergies  Allergen Reactions  . Penicillins Swelling  . Shellfish Allergy Swelling  . Tomato Swelling    Antimicrobials this admission: Cefepime 9/6 >>  Vanc 9/6 >> 9/7  Dose adjustments this admission: Cefepime 2g q12h >> q8h  Microbiology results: 9/6 Blood x 2 - GNR - Klebsiella 9/6 UCx:  MRSA PCR: neg  Thank you for allowing pharmacy to be a part of this patient's care.  Mackie Pai, PharmD PGY1 Pharmacy Resident Pager: [phone] 09/17/2015 12:52 PM

## 2015-09-17 NOTE — Progress Notes (Signed)
RN made aware that pt had a temp of 102.6. RN went to assess pt. Pt sweaty and face flushed. MD called and gave order for PRN Tylenol for pt. PRN med given. Pt given ginger ale as requested. Follow up Temp 100.2. Will continue to assess and monitor pt.     Jonell Cluck, RN, [facility] [address] [place] Phone [phone]

## 2015-09-17 NOTE — Progress Notes (Signed)
ANTICOAGULATION CONSULT NOTE  Pharmacy Consult for heparin Indication: h/o DVT  Allergies  Allergen Reactions  . Penicillins Swelling  . Shellfish Allergy Swelling  . Tomato Swelling    Patient Measurements: Height: 5\' 3"  (160 cm) Weight: (!) 301 lb 2.4 oz (136.6 kg) IBW/kg (Calculated) : 52.4 Heparin Dosing Weight: 86 kg  Vital Signs: Temp: 100.2 F (37.9 C) (09/07 1831) Temp Source: Oral (09/07 1831) BP: 131/56 (09/07 1646) Pulse Rate: 105 (09/07 1646)  Labs:  Recent Labs  09/16/15 0538 09/16/15 0714 09/16/15 1435 09/17/15 0205 09/17/15 1802  HGB 12.4  --  10.4* 10.3*  --   HCT 39.6  --  35.3* 33.7*  --   PLT 154  --  122* 140*  --   LABPROT  --  19.7*  --   --   --   INR  --  1.65  --   --   --   HEPARINUNFRC  --   --   --   --  0.14*  CREATININE 1.24*  --   --  1.24*  --   TROPONINI  --   --  0.10*  --   --     Estimated Creatinine Clearance: 59 mL/min (by C-G formula based on SCr of 1.24 mg/dL).   Medical History: Past Medical History:  Diagnosis Date  . Asthma   . Diabetes mellitus   . DVT, lower extremity, recurrent (HCC)    Patient had unprovoked PE on 2002 and DVT in right lower extremety 2008.  [name] Hypertension   . PE (pulmonary embolism)    Patient had unprovoked PE on 2002  . Vertigo     Assessment: 70yo F on warfarin PTA for h/o DVT to start on heparin infusion. INR 1.65, Hgb 10.3 stable, Plt 140, no bleeding noted.  Initial heparin level is subtherapeutic at 0.14; however, level was obtained only 4 hours after infusion began.  No issues with infusion or sxs of bleeding.    Goal of Therapy:  Heparin level 0.3-0.7 units/ml Monitor platelets by anticoagulation protocol: Yes   Plan:  1. Increase heparin infusion to 1650 units/hr 2. Obtain heparin level in 8 hours   Pollyann Samples, PharmD, BCPS 09/17/2015, 7:01 PM Pager: [phone]

## 2015-09-17 NOTE — Progress Notes (Addendum)
PULMONARY / CRITICAL CARE MEDICINE   Name: Caroline MccallumDaisy N Albaugh MRN: [id number] DOB: 09/26/1945    ADMISSION DATE:  09/16/2015   REFERRING MD:  EDP  CHIEF COMPLAINT: Abd pain  HISTORY OF PRESENT ILLNESS:   70 yo AAF with 3 month hx of right real calculi with on going flank pain. 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 She presents 9/6 with 72hours of increasing rt flank pain, vomiting, diarrhea, and with fever 102 rectal. CT scan abd reveals rt hydronephrosis and obstructing stone rt. She has had 4 litre of IVF and has sbp 109. PCCM asked to admit to ICU.  SUBJECTIVE:  Awake and alert. neo  VITAL SIGNS: BP (!) 109/48   Pulse 92   Temp 99.5 F (37.5 C) (Oral)   Resp (!) 29   Ht 5\' 3"  (1.6 m)   Wt (!) 301 lb 2.4 oz (136.6 kg)   SpO2 100%   BMI 53.35 kg/m   HEMODYNAMICS:    VENTILATOR SETTINGS:    INTAKE / OUTPUT: I/O last 3 completed shifts: In: 4005.3 [I.V.:2955.3; IV Piggyback:1050] Out: 1400 [Urine:1400]  PHYSICAL EXAMINATION: General:  Obese AAF, awake and alert, NAD Neuro:  Intact HEENT:No JVD/LAN, oral mucosa dry Cardiovascular:  HSR RRR Lung CTA Abdomen:  Obese, rt flank less tender, +bs Musculoskeletal:  intact Skin: ho and dry  LABS:  BMET  Recent Labs Lab 09/16/15 0538 09/17/15 0205  NA 140 140  K 3.5 3.9  CL 99* 111  CO2 26 21*  BUN 19 19  CREATININE 1.24* 1.24*  GLUCOSE 198* 176*    Electrolytes  Recent Labs Lab 09/16/15 0538 09/17/15 0205  CALCIUM 9.0 7.2*    CBC  Recent Labs Lab 09/16/15 0538 09/16/15 1435 09/17/15 0205  WBC 9.4 10.6* 22.1*  HGB 12.4 10.4* 10.3*  HCT 39.6 35.3* 33.7*  PLT 154 122* 140*    Coag's  Recent Labs Lab 09/16/15 0714  INR 1.65    Sepsis Markers  Recent Labs Lab 09/16/15 1435 09/16/15 2019 09/16/15 2325  LATICACIDVEN 4.8* 2.6* 2.7*  PROCALCITON 49.03  --   --     ABG No results for input(s): PHART, PCO2ART, PO2ART in the last 168  hours.  Liver Enzymes  Recent Labs Lab 09/16/15 0538  AST 27  ALT 25  ALKPHOS 75  BILITOT 0.6  ALBUMIN 3.7    Cardiac Enzymes  Recent Labs Lab 09/16/15 1435  TROPONINI 0.10*    Glucose  Recent Labs Lab 09/16/15 1348 09/16/15 1549  GLUCAP 177* 163*    Imaging Dg Cystogram  Result Date: 09/16/2015 CLINICAL DATA:  70 year old female undergoing right-sided ureteral stent placement EXAM: CYSTOGRAM - 3+ VIEW CONTRAST:  Please see operative report for detail FLUOROSCOPY TIME:  Fluoroscopy Time:  1 minutes 27 seconds Number of Acquired Spot Images: 0 COMPARISON:  CT abdomen/ pelvis 09/16/2015 FINDINGS: 2 intraoperative spot images demonstrate placement of a right-sided double-J ureteral stent. The proximal loop is appropriately reconstituted and overlies and upper pole calyx. The lower loop is partially reconstituted and overlies the bladder. Partial contrast opacification of the right renal collecting system demonstrates mild hydronephrosis. IMPRESSION: 1. Mild right hydronephrosis. 2. Right-sided double-J ureteral stent. Electronically Signed   By: Malachy MoanHeath  McCullough M.D.   On: 09/16/2015 14:45     STUDIES:  9/6 CT abd with rt renal calculi and hydronephrosis  CULTURES: 9/6 bc x 2>>kliebsiella 9/6 uc>> 9/6 procal>>49.3  ANTIBIOTICS: 9/6 vanc>>>9/7 8/6 cefepime>>  SIGNIFICANT  EVENTS: 9/6 admit to ICU. 9/6 urology consult taken to OR for stone removal 9/7- remained in shock on neo  LINES/TUBES: None   DISCUSSION: 70 yo AAF with 3 month hx of right real calculi with on going flank pain. 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 She presents 9/6 with 72hours of increasing rt flank pain, vomiting, diarrhea, and with fever 102 rectal. CT scan abd reveals rt hydronephrosis and obstructing stone rt. She has had 4 litre of IVF and has sbp 109. PCCM asked to admit to ICU. 9/7 off neo, post right renal stone removal. HD stable, we will  move to floor and trnafer to [facility].  ASSESSMENT / PLAN:  PULMONARY A: No acute issue Hx of PE pulm edema on pcxr 9/6 P:   O2 as needed Limit volume  CARDIOVASCULAR A:  Hx of Htn Shock from sepsis(lactic acid 5.0) P:  DC antihypertensives Fluid resuscitation (post 4 l ivf) Recheck lactic acid 4.8 -> 2.7 Abx for presumed renal infection Vanc/Cefipime Cystoscope for hydronephrosis  - stent placed  RENAL Lab Results  Component Value Date   CREATININE 1.24 (H) 09/17/2015   CREATININE 1.24 (H) 09/16/2015   CREATININE 0.87 03/20/2015   CREATININE 0.73 04/08/2014   CREATININE 0.68 02/19/2013   CREATININE 0.79 11/20/2012    A:   Renal failure secondary to right hydronephrosis  P:   IVF Taken to OR 9/6 per Dr. Sherron Monday for cystoscope and removal of stone.  GASTROINTESTINAL A:   GI protection  P:   ppi  HEMATOLOGIC A:   Chronic anticoagulation with coumadin for remote PE P:  Follow labs Resume heparin, no coumadin post procedure as of yet  INFECTIOUS A:   Presumed sepsis from renal stone and hydronephrosis  P:   Sepsis protocol Vanc /Cefepimine (pcn all)  ENDOCRINE A:   No acute issue P:     NEUROLOGIC A:   NO acute issues. Awake and alert P:   RASS goal: 0 Monitor for ICU delirium    FAMILY  - Updates: Large family updated by S. Minor ACNP and David Stall MD. Pt updated 9/7  - Inter-disciplinary family meet or Palliative Care meeting due by:  Sept 13    Steve Minor ACNP Adolph Pollack PCCM Pager [phone] till 3 pm If no answer page [phone] 09/17/2015, 10:55 AM   STAFF NOTE: I, Rory Percy, MD FACP have personally reviewed patient's available data, including medical history, events of note, physical examination and test results as part of my evaluation. I have discussed with resident/NP and other care providers such as pharmacist, RN and RRT. In addition, I personally evaluated patient and elicited key findings of: awake, fc, no  distress, neo to map goal 60, lungs with reduced, jvd present, PIV, no cvp, with edema on pcxr, kvo as just off neo, dc vanc, kleib noted, keep cefepime and follow sens patterns, chem in am , foley to remain, add heparin drip for fib, no coumadin until we assess tolerability, inr in am, start clears, 8 days abx needed The patient is critically ill with multiple organ systems failure and requires high complexity decision making for assessment and support, frequent evaluation and titration of therapies, application of advanced monitoring technologies and extensive interpretation of multiple databases.   Critical Care Time devoted to patient care services described in this note is 30 Minutes. This time reflects time of care of this signee: Rory Percy, MD FACP. This critical care time  does not reflect procedure time, or teaching time or supervisory time of PA/NP/Med student/Med Resident etc but could involve care discussion time. Rest per NP/medical resident whose note is outlined above and that I agree with   Mcarthur Rossetti. Tyson Alias, MD, FACP Pgr: [phone] [facility] 09/17/2015 11:59 AM

## 2015-09-17 NOTE — Progress Notes (Signed)
Report called to [facility] RN [phone]

## 2015-09-17 NOTE — Progress Notes (Signed)
Tranfer note:  Arrival Method: Bed from 4M  Mental Orientation: A&OX4 Telemetry: N/A Assessment: See flowsheet Skin: Dry; intact IV: L F/A; R Arm- Hep @14 .5 ml/hr Pain: 3/10 Generalized pain Tubes: N/A Safety Measures: bed in lowest position, call light within reach, nonskid sock in place, Fall Prevention Safety Plan: Reviewed 6700 Orientation: Patient has been oriented to the unit, staff and to the room.   Jonell Cluck, RN, [facility] [address] [place] Phone [phone]

## 2015-09-18 LAB — PROTIME-INR
INR: 1.77
PROTHROMBIN TIME: 20.8 s — AB (ref 11.4–15.2)

## 2015-09-18 LAB — CULTURE, BLOOD (ROUTINE X 2)

## 2015-09-18 LAB — CBC
HCT: 32.4 % — ABNORMAL LOW (ref 36.0–46.0)
HEMOGLOBIN: 9.9 g/dL — AB (ref 12.0–15.0)
MCH: 24.6 pg — AB (ref 26.0–34.0)
MCHC: 30.6 g/dL (ref 30.0–36.0)
MCV: 80.6 fL (ref 78.0–100.0)
Platelets: 104 10*3/uL — ABNORMAL LOW (ref 150–400)
RBC: 4.02 MIL/uL (ref 3.87–5.11)
RDW: 15.5 % (ref 11.5–15.5)
WBC: 15.6 10*3/uL — ABNORMAL HIGH (ref 4.0–10.5)

## 2015-09-18 LAB — BASIC METABOLIC PANEL
ANION GAP: 8 (ref 5–15)
BUN: 17 mg/dL (ref 6–20)
CALCIUM: 7.3 mg/dL — AB (ref 8.9–10.3)
CO2: 22 mmol/L (ref 22–32)
Chloride: 111 mmol/L (ref 101–111)
Creatinine, Ser: 0.98 mg/dL (ref 0.44–1.00)
GFR calc Af Amer: >60 mL/min (ref >60)
GFR, EST NON AFRICAN AMERICAN: 58 mL/min — AB (ref >60)
GLUCOSE: 150 mg/dL — AB (ref 65–99)
Potassium: 3.4 mmol/L — ABNORMAL LOW (ref 3.5–5.1)
Sodium: 141 mmol/L (ref 135–145)

## 2015-09-18 LAB — HEPARIN LEVEL (UNFRACTIONATED)
HEPARIN UNFRACTIONATED: 0.22 [IU]/mL — AB (ref 0.30–0.70)
HEPARIN UNFRACTIONATED: 0.29 [IU]/mL — AB (ref 0.30–0.70)
Heparin Unfractionated: 0.44 IU/mL (ref 0.30–0.70)

## 2015-09-18 LAB — APTT: aPTT: 101 seconds — ABNORMAL HIGH (ref 24–36)

## 2015-09-18 LAB — PROCALCITONIN: Procalcitonin: 29.34 ng/mL

## 2015-09-18 MED ORDER — DEXTROSE 5 % IV SOLN
1.0000 g | INTRAVENOUS | Status: DC
  Filled 2015-09-18: qty 10

## 2015-09-18 MED ORDER — POTASSIUM CHLORIDE CRYS ER 20 MEQ PO TBCR
40.0000 meq | EXTENDED_RELEASE_TABLET | Freq: Once | ORAL | Status: AC
  Administered 2015-09-18: 40 meq via ORAL
  Filled 2015-09-18: qty 2

## 2015-09-18 MED ORDER — DEXTROSE 5 % IV SOLN
2.0000 g | INTRAVENOUS | Status: DC
  Administered 2015-09-18 – 2015-09-20 (×3): 2 g via INTRAVENOUS
  Filled 2015-09-18 (×5): qty 2

## 2015-09-18 NOTE — Progress Notes (Signed)
ANTICOAGULATION CONSULT NOTE  Pharmacy Consult for heparin Indication: h/o DVT  Allergies  Allergen Reactions  . Penicillins Swelling  . Shellfish Allergy Swelling  . Tomato Swelling    Patient Measurements: Height: 5\' 3"  (160 cm) Weight: 296 lb 8 oz (134.5 kg) IBW/kg (Calculated) : 52.4 Heparin Dosing Weight: 86 kg  Vital Signs: Temp: 99 F (37.2 C) (09/08 0744) Temp Source: Oral (09/08 0744) BP: 110/55 (09/08 0744) Pulse Rate: 92 (09/08 0744)  Labs:  Recent Labs  09/16/15 0538 09/16/15 0714 09/16/15 1435 09/17/15 0205  09/18/15 0246 09/18/15 0508 09/18/15 1023  HGB 12.4  --  10.4* 10.3*  --  9.9*  --   --   HCT 39.6  --  35.3* 33.7*  --  32.4*  --   --   PLT 154  --  122* 140*  --  104*  --   --   APTT  --   --   --   --   --  101*  --   --   LABPROT  --  19.7*  --   --   --  20.8*  --   --   INR  --  1.65  --   --   --  1.77  --   --   HEPARINUNFRC  --   --   --   --   < > 0.22* 0.29* 0.44  CREATININE 1.24*  --   --  1.24*  --  0.98  --   --   TROPONINI  --   --  0.10*  --   --   --   --   --   < > = values in this interval not displayed.  Estimated Creatinine Clearance: 73.9 mL/min (by C-G formula based on SCr of 0.98 mg/dL).  Assessment: 70yo F on warfarin PTA for h/o DVT admitted with low INR- warfarin on hold, to start on heparin infusion.  After requiring 2 rate increases, heparin level now therapeutic at 0.44units/mL on 1900 units/hr. INR this morning 1.77  Hgb has dropped this morning to 9.9, plts have also fallen to 104- no bleeding noted, could be dilutional.  Goal of Therapy:  Heparin level 0.3-0.7 units/ml Monitor platelets by anticoagulation protocol: Yes   Plan:  -Continue heparin at 1900 units/hr -Daily heparin level and CBC -Follow s/s bleeding -Follow for resumption of warfarin  Kerry Odonohue D. Marializ Ferrebee, PharmD, BCPS Clinical Pharmacist Pager: [phone] 09/18/2015 11:37 AM

## 2015-09-18 NOTE — Progress Notes (Signed)
Pharmacy Antibiotic Note  Caroline [name] is a 70 y.o. female admitted on 09/16/2015 with sepsis 2/2 right ureteral stone, found to have klebsiella pneumoniae bacteremia. Pharmacy has been consulted for ceftriaxone dosing. Previously on cefepime. Documented PCN allergy, but tolerating cefepime. WBC trend down 15.6, Tmax/24h 102.6.  Last dose of cefepime given 9/8 at 1441.  Plan: -Ceftriaxone 2g IV q24h to start 2230 -Monitor clinical status, LOT  Height: 5\' 3"  (160 cm) Weight: 296 lb 8 oz (134.5 kg) IBW/kg (Calculated) : 52.4  Temp (24hrs), Avg:100.3 F (37.9 C), Min:99 F (37.2 C), Max:102.6 F (39.2 C)   Recent Labs Lab 09/16/15 0538  09/16/15 1113 09/16/15 1435 09/16/15 2019 09/16/15 2325 09/17/15 0205 09/17/15 1228 09/18/15 0246  WBC 9.4  --   --  10.6*  --   --  22.1*  --  15.6*  CREATININE 1.24*  --   --   --   --   --  1.24*  --  0.98  LATICACIDVEN  --   < > 5.05* 4.8* 2.6* 2.7*  --  1.7  --   < > = values in this interval not displayed.  Estimated Creatinine Clearance: 73.9 mL/min (by C-G formula based on SCr of 0.98 mg/dL).    Allergies  Allergen Reactions  . Penicillins Swelling  . Shellfish Allergy Swelling  . Tomato Swelling    Antimicrobials this admission: Ceftriaxone 9/8 >> Cefepime 9/6 >> 9/8 Vanc 9/6 >> 9/7  Dose adjustments this admission: Cefepime 2g q12h >> q8h  Microbiology results: 9/6 Blood x 2 - klebsiella pneumoniae (R-amp) 2/2 9/6 UCx: mult species MRSA PCR: neg  Babs Bertin, PharmD, [facility] Clinical Pharmacist Pager [phone] 09/18/2015 3:53 PM

## 2015-09-18 NOTE — Progress Notes (Signed)
[facility]  PROGRESS NOTE  Caroline [name] ZOX:[id number] DOB: 08-17-1945 DOA: 09/16/2015 PCP: Rich Number, MD    Brief HPI:   70 yo AAF with 3 month hx of right real calculi with on going flank pain. 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 She presents 9/6 with 72hours of increasing rt flank pain, vomiting, diarrhea, and with fever 102 rectal. CT scan abd reveals rt hydronephrosis and obstructing stone rt. She has had 4 litre of IVF and has sbp 109. PCCM admitted the patient to ICU,off neo, post right renal stone removal.  Hospitalist service resumed care on 09/18/2015    Assessment/Plan:    1. Sepsis- improving, from right renal stone, hydronephrosis. Blood cultures growing Klebsiella pneumoniae, will switch from cefepime to ceftriaxone. Vancomycin has been discontinued. 2. Right renal stone/hydronephrosis- status post cystoscopy, right retrograde ureterogram and insertion of right ureteral stent. Urology has signed off and follow patient in 2 weeks 3. History of pulmonary embolism- patient was on Coumadin, now on heparin protocol. Pharmacy to manage heparin. 4. Hypokalemia- replace potassium and check BMP in a.m. 5.    DVT prophylaxis: Heparin Code Status: Full code Family Communication: No family at bedside  Disposition Plan: Home when medically stable   Consultants:  [facility] M  Urology  Procedures: SIGNIFICANT EVENTS: 9/6 admit to ICU. 9/6 urology consult taken to OR for stone removal  9/7- remained in shock on neo*  Antibiotics: 9/6 vanc>>>9/7 9/6 cefepime>> 9/8 Ceftriaxone-9/8  Subjective   Patient seen and examined still complains of flank pain. Urology has signed off  Objective    Objective: Vitals:   09/17/15 1831 09/17/15 2144 09/18/15 0424 09/18/15 0744  BP:  (!) 114/51 (!) 120/56 (!) 110/55  Pulse:  95 100 92  Resp:  20 20 (!) 22  Temp: 100.2 F (37.9 C) 99.2 F (37.3 C) (!) 100.4 F (38 C) 99 F  (37.2 C)  TempSrc: Oral Oral Oral Oral  SpO2:  100% 97% 98%  Weight:  134.5 kg (296 lb 8 oz)    Height:  5\' 3"  (1.6 m)      Intake/Output Summary (Last 24 hours) at 09/18/15 1547 Last data filed at 09/18/15 1016  Gross per 24 hour  Intake          2095.42 ml  Output             1225 ml  Net           870.42 ml   Filed Weights   09/16/15 1725 09/17/15 0356 09/17/15 2144  Weight: 133.7 kg (294 lb 12.1 oz) (!) 136.6 kg (301 lb 2.4 oz) 134.5 kg (296 lb 8 oz)    Examination:  General exam: Appears calm and comfortable  Respiratory system: Clear to auscultation. Respiratory effort normal. Cardiovascular system: S1 & S2 heard, RRR. No JVD, murmurs, rubs, gallops or clicks. No pedal edema. Gastrointestinal system: Abdomen is nondistended, soft and + right flank tenderness. No organomegaly or masses felt. Normal bowel sounds heard. Central nervous system: Alert and oriented. No focal neurological deficits. Extremities: Symmetric 5 x 5 power. Skin: No rashes, lesions or ulcers Psychiatry: Judgement and insight appear normal. Mood & affect appropriate.    Data Reviewed: I have personally reviewed following labs and imaging studies Basic Metabolic Panel:  Recent Labs Lab 09/16/15 0538 09/17/15 0205 09/18/15 0246  NA 140 140 141  K 3.5 3.9 3.4*  CL 99* 111 111  CO2 26 21*  22  GLUCOSE 198* 176* 150*  BUN 19 19 17   CREATININE 1.24* 1.24* 0.98  CALCIUM 9.0 7.2* 7.3*   Liver Function Tests:  Recent Labs Lab 09/16/15 0538  AST 27  ALT 25  ALKPHOS 75  BILITOT 0.6  PROT 7.5  ALBUMIN 3.7    Recent Labs Lab 09/16/15 0538  LIPASE 36   No results for input(s): AMMONIA in the last 168 hours. CBC:  Recent Labs Lab 09/16/15 0538 09/16/15 1435 09/17/15 0205 09/18/15 0246  WBC 9.4 10.6* 22.1* 15.6*  HGB 12.4 10.4* 10.3* 9.9*  HCT 39.6 35.3* 33.7* 32.4*  MCV 79.5 81.7 81.4 80.6  PLT 154 122* 140* 104*   Cardiac Enzymes:  Recent Labs Lab 09/16/15 1435   TROPONINI 0.10*   BNP (last 3 results) No results for input(s): BNP in the last 8760 hours.  ProBNP (last 3 results) No results for input(s): PROBNP in the last 8760 hours.  CBG:  Recent Labs Lab 09/16/15 1348 09/16/15 1549  GLUCAP 177* 163*    Recent Results (from the past 240 hour(s))  Urine culture     Status: Abnormal   Collection Time: 09/16/15  5:53 AM  Result Value Ref Range Status   Specimen Description URINE, RANDOM  Final   Special Requests NONE  Final   Culture MULTIPLE SPECIES PRESENT, SUGGEST RECOLLECTION (A)  Final   Report Status 09/17/2015 FINAL  Final  Blood Culture (routine x 2)     Status: Abnormal   Collection Time: 09/16/15  8:15 AM  Result Value Ref Range Status   Specimen Description BLOOD LEFT ANTECUBITAL  Final   Special Requests BOTTLES DRAWN AEROBIC AND ANAEROBIC 5CC  Final   Culture  Setup Time   Final    GRAM NEGATIVE RODS IN BOTH AEROBIC AND ANAEROBIC BOTTLES CRITICAL RESULT CALLED TO, READ BACK BY AND VERIFIED WITH: T. EGAN PHARMD, AT 7654 09/16/15 A. BROWNING    Culture KLEBSIELLA PNEUMONIAE (A)  Final   Report Status 09/18/2015 FINAL  Final   Organism ID, Bacteria KLEBSIELLA PNEUMONIAE  Final      Susceptibility   Klebsiella pneumoniae - MIC*    AMPICILLIN 16 RESISTANT Resistant     CEFAZOLIN <=4 SENSITIVE Sensitive     CEFEPIME <=1 SENSITIVE Sensitive     CEFTAZIDIME <=1 SENSITIVE Sensitive     CEFTRIAXONE <=1 SENSITIVE Sensitive     CIPROFLOXACIN <=0.25 SENSITIVE Sensitive     GENTAMICIN <=1 SENSITIVE Sensitive     IMIPENEM <=0.25 SENSITIVE Sensitive     TRIMETH/SULFA <=20 SENSITIVE Sensitive     AMPICILLIN/SULBACTAM 4 SENSITIVE Sensitive     PIP/TAZO <=4 SENSITIVE Sensitive     Extended ESBL NEGATIVE Sensitive     * KLEBSIELLA PNEUMONIAE  Blood Culture (routine x 2)     Status: Abnormal   Collection Time: 09/16/15  8:30 AM  Result Value Ref Range Status   Specimen Description BLOOD LEFT HAND  Final   Special Requests BOTTLES  DRAWN AEROBIC AND ANAEROBIC 5CC  Final   Culture  Setup Time   Final    GRAM NEGATIVE RODS ANAEROBIC BOTTLE ONLY CRITICAL RESULT CALLED TO, READ BACK BY AND VERIFIED WITH: Hilarie Fredrickson PHARMD 1934 09/16/15 A BROWNING    Culture (A)  Final    KLEBSIELLA PNEUMONIAE SUSCEPTIBILITIES PERFORMED ON PREVIOUS CULTURE WITHIN THE LAST 5 DAYS.    Report Status 09/18/2015 FINAL  Final  Blood Culture ID Panel (Reflexed)     Status: Abnormal   Collection Time: 09/16/15  8:30 AM  Result Value Ref Range Status   Enterococcus species NOT DETECTED NOT DETECTED Final   Vancomycin resistance NOT DETECTED NOT DETECTED Final   Listeria monocytogenes NOT DETECTED NOT DETECTED Final   Staphylococcus species NOT DETECTED NOT DETECTED Final   Staphylococcus aureus NOT DETECTED NOT DETECTED Final   Methicillin resistance NOT DETECTED NOT DETECTED Final   Streptococcus species NOT DETECTED NOT DETECTED Final   Streptococcus agalactiae NOT DETECTED NOT DETECTED Final   Streptococcus pneumoniae NOT DETECTED NOT DETECTED Final   Streptococcus pyogenes NOT DETECTED NOT DETECTED Final   Acinetobacter baumannii NOT DETECTED NOT DETECTED Final   Enterobacteriaceae species DETECTED (A) NOT DETECTED Final    Comment: CRITICAL RESULT CALLED TO, READ BACK BY AND VERIFIED WITH: T EGAN PHARMD 1934 09/16/15 A BROWNING    Enterobacter cloacae complex NOT DETECTED NOT DETECTED Final   Escherichia coli NOT DETECTED NOT DETECTED Final   Klebsiella oxytoca NOT DETECTED NOT DETECTED Final   Klebsiella pneumoniae DETECTED (A) NOT DETECTED Final    Comment: CRITICAL RESULT CALLED TO, READ BACK BY AND VERIFIED WITH: Hilarie Fredrickson EGAN PHARMD 1934 09/16/15 A BROWNING    Proteus species NOT DETECTED NOT DETECTED Final   Serratia marcescens NOT DETECTED NOT DETECTED Final   Carbapenem resistance NOT DETECTED NOT DETECTED Final   Haemophilus influenzae NOT DETECTED NOT DETECTED Final   Neisseria meningitidis NOT DETECTED NOT DETECTED Final   Pseudomonas  aeruginosa NOT DETECTED NOT DETECTED Final   Candida albicans NOT DETECTED NOT DETECTED Final   Candida glabrata NOT DETECTED NOT DETECTED Final   Candida krusei NOT DETECTED NOT DETECTED Final   Candida parapsilosis NOT DETECTED NOT DETECTED Final   Candida tropicalis NOT DETECTED NOT DETECTED Final  MRSA PCR Screening     Status: None   Collection Time: 09/16/15  4:50 PM  Result Value Ref Range Status   MRSA by PCR NEGATIVE NEGATIVE Final    Comment:        The GeneXpert MRSA Assay (FDA approved for NASAL specimens only), is one component of a comprehensive MRSA colonization surveillance program. It is not intended to diagnose MRSA infection nor to guide or monitor treatment for MRSA infections.      Studies: No results found.  Scheduled Meds: . ceFEPime (MAXIPIME) IV  2 g Intravenous Q8H  . pantoprazole  40 mg Oral Daily   Continuous Infusions: . sodium chloride 100 mL/hr (09/17/15 0605)  . heparin 1,900 Units/hr (09/18/15 1441)  . lactated ringers Stopped (09/16/15 1600)       Time spent: 25 min    [facility] Hospital[facility],Fain Francis S  [facility] Pager [phone]. If 7PM-7AM, please contact night-coverage at [email or web address], Office  [phone]  password [email or web address] 09/18/2015, 3:47 PM  LOS: 2 days

## 2015-09-18 NOTE — Progress Notes (Signed)
ANTICOAGULATION CONSULT NOTE - Follow Up Consult  Pharmacy Consult for heparin Indication: h/o DVT  Labs:  Recent Labs  09/16/15 0538 09/16/15 0714 09/16/15 1435 09/17/15 0205 09/17/15 1802 09/18/15 0246  HGB 12.4  --  10.4* 10.3*  --  9.9*  HCT 39.6  --  35.3* 33.7*  --  32.4*  PLT 154  --  122* 140*  --  PENDING  APTT  --   --   --   --   --  101*  LABPROT  --  19.7*  --   --   --  20.8*  INR  --  1.65  --   --   --  1.77  HEPARINUNFRC  --   --   --   --  0.14* 0.22*  CREATININE 1.24*  --   --  1.24*  --  0.98  TROPONINI  --   --  0.10*  --   --   --     Assessment: 70yo female remains subtherapeutic on heparin after rate change.  Goal of Therapy:  Heparin level 0.3-0.7 units/ml   Plan:  Will increase heparin gtt by 2 units/kg/hr to 1900 units/hr and check level in 6hr.  Vernard Gambles, PharmD, BCPS  09/18/2015,4:22 AM

## 2015-09-19 ENCOUNTER — Encounter (HOSPITAL_COMMUNITY): Payer: Self-pay | Admitting: *Deleted

## 2015-09-19 LAB — CBC
HCT: 31.4 % — ABNORMAL LOW (ref 36.0–46.0)
HEMOGLOBIN: 9.7 g/dL — AB (ref 12.0–15.0)
MCH: 24.8 pg — ABNORMAL LOW (ref 26.0–34.0)
MCHC: 30.9 g/dL (ref 30.0–36.0)
MCV: 80.3 fL (ref 78.0–100.0)
Platelets: 101 10*3/uL — ABNORMAL LOW (ref 150–400)
RBC: 3.91 MIL/uL (ref 3.87–5.11)
RDW: 15.9 % — ABNORMAL HIGH (ref 11.5–15.5)
WBC: 14.6 10*3/uL — AB (ref 4.0–10.5)

## 2015-09-19 LAB — HEPARIN LEVEL (UNFRACTIONATED): HEPARIN UNFRACTIONATED: 0.37 [IU]/mL (ref 0.30–0.70)

## 2015-09-19 LAB — BASIC METABOLIC PANEL
Anion gap: 4 — ABNORMAL LOW (ref 5–15)
BUN: 8 mg/dL (ref 6–20)
CHLORIDE: 115 mmol/L — AB (ref 101–111)
CO2: 25 mmol/L (ref 22–32)
CREATININE: 0.75 mg/dL (ref 0.44–1.00)
Calcium: 7.8 mg/dL — ABNORMAL LOW (ref 8.9–10.3)
GFR calc non Af Amer: >60 mL/min (ref >60)
Glucose, Bld: 134 mg/dL — ABNORMAL HIGH (ref 65–99)
POTASSIUM: 3.7 mmol/L (ref 3.5–5.1)
SODIUM: 144 mmol/L (ref 135–145)

## 2015-09-19 LAB — PROCALCITONIN: PROCALCITONIN: 14.9 ng/mL

## 2015-09-19 MED ORDER — WARFARIN - PHARMACIST DOSING INPATIENT
Freq: Every day | Status: DC
  Administered 2015-09-20: 18:00:00

## 2015-09-19 MED ORDER — WARFARIN SODIUM 2 MG PO TABS
2.0000 mg | ORAL_TABLET | Freq: Once | ORAL | Status: AC
  Administered 2015-09-19: 2 mg via ORAL
  Filled 2015-09-19: qty 1

## 2015-09-19 NOTE — Progress Notes (Signed)
[facility]  PROGRESS NOTE  Caroline MccallumDaisy N Coltrane ZOX:[id number] DOB: 05/06/1945 DOA: 09/16/2015 PCP: Rich Numberivet, Carly, MD    Brief HPI:   70 yo AAF with 3 month hx of right real calculi with on going flank pain. 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 She presents 9/6 with 72hours of increasing rt flank pain, vomiting, diarrhea, and with fever 102 rectal. CT scan abd reveals rt hydronephrosis and obstructing stone rt. She has had 4 litre of IVF and has sbp 109. PCCM admitted the patient to ICU,off neo, post right renal stone removal.  Hospitalist service resumed care on 09/18/2015    Assessment/Plan:    1. Sepsis- improving, from right renal stone, hydronephrosis. Blood cultures growing Klebsiella pneumoniae, will  cefepime has been switched to ceftriaxone. Vancomycin has been discontinued. 2. Right renal stone/hydronephrosis- status post cystoscopy, right retrograde ureterogram and insertion of right ureteral stent. Urology has signed off and follow patient in 2 weeks 3. History of pulmonary embolism- patient was on Coumadin, now on heparin protocol. Pharmacy to manage heparin. Will restart warfarin per pharmacy consolidation. 4. Hypokalemia- repeat 5.    DVT prophylaxis: Heparin Code Status: Full code Family Communication: Discussed with patient's daughter and son at bedside. Disposition Plan: We'll consult physical therapy for further recommendations.   [facility]  [facility]  Procedures: SIGNIFICANT EVENTS: 9/6 admit to ICU. 9/6 urology consult taken to OR for stone removal  9/7- remained in shock on neo*  Antibiotics: 9/6 vanc>>>9/7 9/6 cefepime>> 9/8 Ceftriaxone-9/8 >>  Subjective   Patient seen and examined still complains of flank pain. Wants to try solid food.  Objective    Objective: Vitals:   09/18/15 1655 09/18/15 2025 09/19/15 0500 09/19/15 0604  BP: 138/73 123/64  132/64  Pulse: 88 89  92  Resp: 20 20   19   Temp: 99 F (37.2 C) 98.4 F (36.9 C)  100 F (37.8 C)  TempSrc: Oral Oral  Oral  SpO2: 99% 98%  97%  Weight:  136 kg (299 lb 12.8 oz) 136 kg (299 lb 13.2 oz)   Height:        Intake/Output Summary (Last 24 hours) at 09/19/15 1113 Last data filed at 09/19/15 0605  Gross per 24 hour  Intake             2018 ml  Output             1175 ml  Net              843 ml   Filed Weights   09/17/15 2144 09/18/15 2025 09/19/15 0500  Weight: 134.5 kg (296 lb 8 oz) 136 kg (299 lb 12.8 oz) 136 kg (299 lb 13.2 oz)    Examination:  General exam: Appears calm and comfortable  Respiratory system: Clear to auscultation. Respiratory effort normal. Cardiovascular system: S1 & S2 heard, RRR. No JVD, murmurs, rubs, gallops or clicks. No pedal edema. Gastrointestinal system: Abdomen is nondistended, soft and + right flank tenderness. No organomegaly or masses felt. Normal bowel sounds heard. Central nervous system: Alert and oriented. No focal neurological deficits. Extremities: Symmetric 5 x 5 power. Skin: No rashes, lesions or ulcers Psychiatry: Judgement and insight appear normal. Mood & affect appropriate.    Data Reviewed: I have personally reviewed following labs and imaging studies Basic Metabolic Panel:  Recent Labs Lab 09/16/15 0538 09/17/15 0205 09/18/15 0246 09/19/15 0321  NA 140 140 141 144  K  3.5 3.9 3.4* 3.7  CL 99* 111 111 115*  CO2 26 21* 22 25  GLUCOSE 198* 176* 150* 134*  BUN 19 19 17 8   CREATININE 1.24* 1.24* 0.98 0.75  CALCIUM 9.0 7.2* 7.3* 7.8*   Liver Function Tests:  Recent Labs Lab 09/16/15 0538  AST 27  ALT 25  ALKPHOS 75  BILITOT 0.6  PROT 7.5  ALBUMIN 3.7    Recent Labs Lab 09/16/15 0538  LIPASE 36   No results for input(s): AMMONIA in the last 168 hours. CBC:  Recent Labs Lab 09/16/15 0538 09/16/15 1435 09/17/15 0205 09/18/15 0246 09/19/15 0321  WBC 9.4 10.6* 22.1* 15.6* 14.6*  HGB 12.4 10.4* 10.3* 9.9* 9.7*  HCT 39.6 35.3*  33.7* 32.4* 31.4*  MCV 79.5 81.7 81.4 80.6 80.3  PLT 154 122* 140* 104* 101*   Cardiac Enzymes:  Recent Labs Lab 09/16/15 1435  TROPONINI 0.10*   BNP (last 3 results) No results for input(s): BNP in the last 8760 hours.  ProBNP (last 3 results) No results for input(s): PROBNP in the last 8760 hours.  CBG:  Recent Labs Lab 09/16/15 1348 09/16/15 1549  GLUCAP 177* 163*    Recent Results (from the past 240 hour(s))  Urine culture     Status: Abnormal   Collection Time: 09/16/15  5:53 AM  Result Value Ref Range Status   Specimen Description URINE, RANDOM  Final   Special Requests NONE  Final   Culture MULTIPLE SPECIES PRESENT, SUGGEST RECOLLECTION (A)  Final   Report Status 09/17/2015 FINAL  Final  Blood Culture (routine x 2)     Status: Abnormal   Collection Time: 09/16/15  8:15 AM  Result Value Ref Range Status   Specimen Description BLOOD LEFT ANTECUBITAL  Final   Special Requests BOTTLES DRAWN AEROBIC AND ANAEROBIC 5CC  Final   Culture  Setup Time   Final    GRAM NEGATIVE RODS IN BOTH AEROBIC AND ANAEROBIC BOTTLES CRITICAL RESULT CALLED TO, READ BACK BY AND VERIFIED WITH: T. EGAN PHARMD, AT 1093 09/16/15 A. BROWNING    Culture KLEBSIELLA PNEUMONIAE (A)  Final   Report Status 09/18/2015 FINAL  Final   Organism ID, Bacteria KLEBSIELLA PNEUMONIAE  Final      Susceptibility   Klebsiella pneumoniae - MIC*    AMPICILLIN 16 RESISTANT Resistant     CEFAZOLIN <=4 SENSITIVE Sensitive     CEFEPIME <=1 SENSITIVE Sensitive     CEFTAZIDIME <=1 SENSITIVE Sensitive     CEFTRIAXONE <=1 SENSITIVE Sensitive     CIPROFLOXACIN <=0.25 SENSITIVE Sensitive     GENTAMICIN <=1 SENSITIVE Sensitive     IMIPENEM <=0.25 SENSITIVE Sensitive     TRIMETH/SULFA <=20 SENSITIVE Sensitive     AMPICILLIN/SULBACTAM 4 SENSITIVE Sensitive     PIP/TAZO <=4 SENSITIVE Sensitive     Extended ESBL NEGATIVE Sensitive     * KLEBSIELLA PNEUMONIAE  Blood Culture (routine x 2)     Status: Abnormal    Collection Time: 09/16/15  8:30 AM  Result Value Ref Range Status   Specimen Description BLOOD LEFT HAND  Final   Special Requests BOTTLES DRAWN AEROBIC AND ANAEROBIC 5CC  Final   Culture  Setup Time   Final    GRAM NEGATIVE RODS ANAEROBIC BOTTLE ONLY CRITICAL RESULT CALLED TO, READ BACK BY AND VERIFIED WITH: Hilarie Fredrickson PHARMD 1934 09/16/15 A BROWNING    Culture (A)  Final    KLEBSIELLA PNEUMONIAE SUSCEPTIBILITIES PERFORMED ON PREVIOUS CULTURE WITHIN THE LAST 5 DAYS.  Report Status 09/18/2015 FINAL  Final  Blood Culture ID Panel (Reflexed)     Status: Abnormal   Collection Time: 09/16/15  8:30 AM  Result Value Ref Range Status   Enterococcus species NOT DETECTED NOT DETECTED Final   Vancomycin resistance NOT DETECTED NOT DETECTED Final   Listeria monocytogenes NOT DETECTED NOT DETECTED Final   Staphylococcus species NOT DETECTED NOT DETECTED Final   Staphylococcus aureus NOT DETECTED NOT DETECTED Final   Methicillin resistance NOT DETECTED NOT DETECTED Final   Streptococcus species NOT DETECTED NOT DETECTED Final   Streptococcus agalactiae NOT DETECTED NOT DETECTED Final   Streptococcus pneumoniae NOT DETECTED NOT DETECTED Final   Streptococcus pyogenes NOT DETECTED NOT DETECTED Final   Acinetobacter baumannii NOT DETECTED NOT DETECTED Final   Enterobacteriaceae species DETECTED (A) NOT DETECTED Final    Comment: CRITICAL RESULT CALLED TO, READ BACK BY AND VERIFIED WITH: T EGAN PHARMD 1934 09/16/15 A BROWNING    Enterobacter cloacae complex NOT DETECTED NOT DETECTED Final   Escherichia coli NOT DETECTED NOT DETECTED Final   Klebsiella oxytoca NOT DETECTED NOT DETECTED Final   Klebsiella pneumoniae DETECTED (A) NOT DETECTED Final    Comment: CRITICAL RESULT CALLED TO, READ BACK BY AND VERIFIED WITH: Hilarie Fredrickson PHARMD 1934 09/16/15 A BROWNING    Proteus species NOT DETECTED NOT DETECTED Final   Serratia marcescens NOT DETECTED NOT DETECTED Final   Carbapenem resistance NOT DETECTED NOT  DETECTED Final   Haemophilus influenzae NOT DETECTED NOT DETECTED Final   Neisseria meningitidis NOT DETECTED NOT DETECTED Final   Pseudomonas aeruginosa NOT DETECTED NOT DETECTED Final   Candida albicans NOT DETECTED NOT DETECTED Final   Candida glabrata NOT DETECTED NOT DETECTED Final   Candida krusei NOT DETECTED NOT DETECTED Final   Candida parapsilosis NOT DETECTED NOT DETECTED Final   Candida tropicalis NOT DETECTED NOT DETECTED Final  MRSA PCR Screening     Status: None   Collection Time: 09/16/15  4:50 PM  Result Value Ref Range Status   MRSA by PCR NEGATIVE NEGATIVE Final    Comment:        The GeneXpert MRSA Assay (FDA approved for NASAL specimens only), is one component of a comprehensive MRSA colonization surveillance program. It is not intended to diagnose MRSA infection nor to guide or monitor treatment for MRSA infections.      Studies: No results found.  Scheduled Meds: . cefTRIAXone (ROCEPHIN)  IV  2 g Intravenous Q24H  . pantoprazole  40 mg Oral Daily  . warfarin  2 mg Oral ONCE-1800  . Warfarin - Pharmacist Dosing Inpatient   Does not apply q1800   Continuous Infusions: . sodium chloride 100 mL/hr at 09/19/15 0316  . heparin 1,900 Units/hr (09/19/15 0507)  . lactated ringers Stopped (09/16/15 1600)       Time spent: 25 min    [facility]  [facility] Pager [phone]. If 7PM-7AM, please contact night-coverage at [email or web address], Office  [phone]  password [email or web address] 09/19/2015, 11:13 AM  LOS: 3 days

## 2015-09-19 NOTE — Progress Notes (Signed)
ANTICOAGULATION CONSULT NOTE  Pharmacy Consult for heparin/warfarin Indication: h/o DVT and PE  Allergies  Allergen Reactions  . Penicillins Swelling  . Shellfish Allergy Swelling  . Tomato Swelling    Patient Measurements: Height: 5\' 3"  (160 cm) Weight: 299 lb 13.2 oz (136 kg) IBW/kg (Calculated) : 52.4 Heparin Dosing Weight: 86 kg  Vital Signs: Temp: 100 F (37.8 C) (09/09 0604) Temp Source: Oral (09/09 0604) BP: 132/64 (09/09 0604) Pulse Rate: 92 (09/09 0604)  Labs:  Recent Labs  09/16/15 1435 09/17/15 0205  09/18/15 0246 09/18/15 0508 09/18/15 1023 09/19/15 0321  HGB 10.4* 10.3*  --  9.9*  --   --  9.7*  HCT 35.3* 33.7*  --  32.4*  --   --  31.4*  PLT 122* 140*  --  104*  --   --  101*  APTT  --   --   --  101*  --   --   --   LABPROT  --   --   --  20.8*  --   --   --   INR  --   --   --  1.77  --   --   --   HEPARINUNFRC  --   --   < > 0.22* 0.29* 0.44 0.37  CREATININE  --  1.24*  --  0.98  --   --  0.75  TROPONINI 0.10*  --   --   --   --   --   --   < > = values in this interval not displayed.  Estimated Creatinine Clearance: 91.2 mL/min (by C-G formula based on SCr of 0.8 mg/dL).  Assessment: 70yo F on warfarin PTA for h/o DVT and PE admitted with low INR- warfarin was held while on heparin infusion. Pharmacy has been consulted to re-start warfarin. HL therapeutic today at 0.37. INR 1.77 on 9/8, Hgb 9.7 stable, Plt 101 stable  PTA Warfarin Dose: 4mg  MWF and 2mg  AODs  Goal of Therapy:  Heparin level 0.3-0.7 units/ml Monitor platelets by anticoagulation protocol: Yes   Plan:  -Will continue heparin at 1900 units/hr to bridge to therapeutic INR. D/c once therapeutic x2 -Give warfarin 2 mg PO tonight -Daily heparin level, INR, CBC -Monitor s/sx bleeding   Mackie Pai, PharmD PGY1 Pharmacy Resident Pager: [phone] 09/19/2015 8:49 AM

## 2015-09-20 LAB — HEPARIN LEVEL (UNFRACTIONATED): HEPARIN UNFRACTIONATED: 0.36 [IU]/mL (ref 0.30–0.70)

## 2015-09-20 LAB — CBC
HCT: 30.4 % — ABNORMAL LOW (ref 36.0–46.0)
HEMOGLOBIN: 9.3 g/dL — AB (ref 12.0–15.0)
MCH: 24.3 pg — ABNORMAL LOW (ref 26.0–34.0)
MCHC: 30.6 g/dL (ref 30.0–36.0)
MCV: 79.4 fL (ref 78.0–100.0)
PLATELETS: 130 10*3/uL — AB (ref 150–400)
RBC: 3.83 MIL/uL — AB (ref 3.87–5.11)
RDW: 16 % — ABNORMAL HIGH (ref 11.5–15.5)
WBC: 11.6 10*3/uL — ABNORMAL HIGH (ref 4.0–10.5)

## 2015-09-20 LAB — PROTIME-INR
INR: 1.88
PROTHROMBIN TIME: 21.8 s — AB (ref 11.4–15.2)

## 2015-09-20 MED ORDER — WARFARIN SODIUM 2 MG PO TABS
2.0000 mg | ORAL_TABLET | Freq: Once | ORAL | Status: AC
  Administered 2015-09-20: 2 mg via ORAL
  Filled 2015-09-20: qty 1

## 2015-09-20 NOTE — Progress Notes (Signed)
ANTICOAGULATION CONSULT NOTE  Pharmacy Consult for heparin/warfarin Indication: h/o DVT and PE  Allergies  Allergen Reactions  . Penicillins Swelling  . Shellfish Allergy Swelling  . Tomato Swelling    Patient Measurements: Height: 5\' 3"  (160 cm) Weight: (!) 303 lb 12.7 oz (137.8 kg) IBW/kg (Calculated) : 52.4 Heparin Dosing Weight: 86 kg  Vital Signs: Temp: 98.4 F (36.9 C) (09/10 0834) Temp Source: Oral (09/10 0834) BP: 135/55 (09/10 0834) Pulse Rate: 74 (09/10 0834)  Labs:  Recent Labs  09/18/15 0246  09/18/15 1023 09/19/15 0321 09/20/15 0541  HGB 9.9*  --   --  9.7* 9.3*  HCT 32.4*  --   --  31.4* 30.4*  PLT 104*  --   --  101* 130*  APTT 101*  --   --   --   --   LABPROT 20.8*  --   --   --  21.8*  INR 1.77  --   --   --  1.88  HEPARINUNFRC 0.22*  < > 0.44 0.37 0.36  CREATININE 0.98  --   --  0.75  --   < > = values in this interval not displayed.  Estimated Creatinine Clearance: 92 mL/min (by C-G formula based on SCr of 0.8 mg/dL).  Assessment: 70yo F on warfarin PTA for history of DVT and PE. Pharmacy has been consulted to dose heparin and warfarin. Heparin level therapeutic today at 0.36 and slowly trending up INR 1.88. Hemoglobin slight down trend to 9.3  and platelets are stable today at 130.  PTA Warfarin Dose: 4mg  MWF and 2mg  AODs  Goal of Therapy:  Heparin level 0.3-0.7 units/ml Monitor platelets by anticoagulation protocol: Yes   Plan:  -Will continue heparin at 1900 units/hr to bridge to therapeutic INR. D/c once therapeutic x2 -Give warfarin 2 mg PO tonight -Daily heparin level, INR, CBC -Monitor s/sx bleeding  Ruben Im, PharmD Clinical Pharmacist Pager: [phone] 09/20/2015 11:00 AM

## 2015-09-20 NOTE — Progress Notes (Signed)
[facility]  PROGRESS NOTE  Caroline [name]:[id number] DOB: 1945/02/16 DOA: 09/16/2015 PCP: Rich Number, MD    Brief HPI:   70 yo AAF with 3 month hx of right real calculi with on going flank pain. 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 She presents 9/6 with 72hours of increasing rt flank pain, vomiting, diarrhea, and with fever 102 rectal. CT scan abd reveals rt hydronephrosis and obstructing stone rt. She has had 4 litre of IVF and has sbp 109. PCCM admitted the patient to ICU,off neo, post right renal stone removal.  Hospitalist service resumed care on 09/18/2015    Assessment/Plan:    1. Sepsis- improving, from right renal stone, hydronephrosis. Blood cultures growing Klebsiella pneumoniae, will  cefepime has been switched to ceftriaxone. Vancomycin has been discontinued. 2. Right renal stone/hydronephrosis- status post cystoscopy, right retrograde ureterogram and insertion of right ureteral stent. Urology has signed off and follow patient in 2 weeks 3. History of pulmonary embolism- patient was on Coumadin, now on heparin protocol. Pharmacy to manage heparin. Will restart warfarin per pharmacy consultation. 4. Hypokalemia- repeat 5.    DVT prophylaxis: Heparin Code Status: Full code Family Communication: Discussed with patient's daughter and son at bedside on 09/19/15. Disposition Plan: We'll consult physical therapy for further recommendations.   Consultants:  [facility]  Urology  Procedures: SIGNIFICANT EVENTS: 9/6 admit to ICU. 9/6 urology consult taken to OR for stone removal  9/7- remained in shock on neo  Antibiotics: 9/6 vanc>>>9/7 9/6 cefepime>> 9/8 Ceftriaxone-9/8 >>  Subjective   Patient seen and examined, denies pain at this time.   Objective    Objective: Vitals:   09/19/15 2100 09/20/15 0244 09/20/15 0433 09/20/15 0834  BP: (!) 155/76  119/62 (!) 135/55  Pulse: 93  88 74  Resp: 19  20 20   Temp:  98.9 F (37.2 C)  99.7 F (37.6 C) 98.4 F (36.9 C)  TempSrc: Oral  Oral Oral  SpO2: 99%  96% 96%  Weight: (!) 137.8 kg (303 lb 11.2 oz) (!) 137.8 kg (303 lb 12.7 oz)    Height:        Intake/Output Summary (Last 24 hours) at 09/20/15 0927 Last data filed at 09/20/15 0835  Gross per 24 hour  Intake             3073 ml  Output             1675 ml  Net             1398 ml   Filed Weights   09/19/15 0500 09/19/15 2100 09/20/15 0244  Weight: 136 kg (299 lb 13.2 oz) (!) 137.8 kg (303 lb 11.2 oz) (!) 137.8 kg (303 lb 12.7 oz)    Examination:  General exam: Appears calm and comfortable  Respiratory system: Clear to auscultation. Respiratory effort normal. Cardiovascular system: S1 & S2 heard, RRR. No JVD, murmurs, rubs, gallops or clicks. No pedal edema. Gastrointestinal system: Abdomen is nondistended, soft and + right flank tenderness. No organomegaly or masses felt. Normal bowel sounds heard. Central nervous system: Alert and oriented. No focal neurological deficits. Extremities: Symmetric 5 x 5 power. Skin: No rashes, lesions or ulcers Psychiatry: Judgement and insight appear normal. Mood & affect appropriate.    Data Reviewed: I have personally reviewed following labs and imaging studies Basic Metabolic Panel:  Recent Labs Lab 09/16/15 0538 09/17/15 0205 09/18/15 0246 09/19/15 0321  NA 140 140 141  144  K 3.5 3.9 3.4* 3.7  CL 99* 111 111 115*  CO2 26 21* 22 25  GLUCOSE 198* 176* 150* 134*  BUN 19 19 17 8   CREATININE 1.24* 1.24* 0.98 0.75  CALCIUM 9.0 7.2* 7.3* 7.8*   Liver Function Tests:  Recent Labs Lab 09/16/15 0538  AST 27  ALT 25  ALKPHOS 75  BILITOT 0.6  PROT 7.5  ALBUMIN 3.7    Recent Labs Lab 09/16/15 0538  LIPASE 36   No results for input(s): AMMONIA in the last 168 hours. CBC:  Recent Labs Lab 09/16/15 1435 09/17/15 0205 09/18/15 0246 09/19/15 0321 09/20/15 0541  WBC 10.6* 22.1* 15.6* 14.6* 11.6*  HGB 10.4* 10.3* 9.9* 9.7* 9.3*   HCT 35.3* 33.7* 32.4* 31.4* 30.4*  MCV 81.7 81.4 80.6 80.3 79.4  PLT 122* 140* 104* 101* 130*   Cardiac Enzymes:  Recent Labs Lab 09/16/15 1435  TROPONINI 0.10*   BNP (last 3 results) No results for input(s): BNP in the last 8760 hours.  ProBNP (last 3 results) No results for input(s): PROBNP in the last 8760 hours.  CBG:  Recent Labs Lab 09/16/15 1348 09/16/15 1549  GLUCAP 177* 163*    Recent Results (from the past 240 hour(s))  Urine culture     Status: Abnormal   Collection Time: 09/16/15  5:53 AM  Result Value Ref Range Status   Specimen Description URINE, RANDOM  Final   Special Requests NONE  Final   Culture MULTIPLE SPECIES PRESENT, SUGGEST RECOLLECTION (A)  Final   Report Status 09/17/2015 FINAL  Final  Blood Culture (routine x 2)     Status: Abnormal   Collection Time: 09/16/15  8:15 AM  Result Value Ref Range Status   Specimen Description BLOOD LEFT ANTECUBITAL  Final   Special Requests BOTTLES DRAWN AEROBIC AND ANAEROBIC 5CC  Final   Culture  Setup Time   Final    GRAM NEGATIVE RODS IN BOTH AEROBIC AND ANAEROBIC BOTTLES CRITICAL RESULT CALLED TO, READ BACK BY AND VERIFIED WITH: T. EGAN PHARMD, AT [id number] 09/16/15 A. BROWNING    Culture KLEBSIELLA PNEUMONIAE (A)  Final   Report Status 09/18/2015 FINAL  Final   Organism ID, Bacteria KLEBSIELLA PNEUMONIAE  Final      Susceptibility   Klebsiella pneumoniae - MIC*    AMPICILLIN 16 RESISTANT Resistant     CEFAZOLIN <=4 SENSITIVE Sensitive     CEFEPIME <=1 SENSITIVE Sensitive     CEFTAZIDIME <=1 SENSITIVE Sensitive     CEFTRIAXONE <=1 SENSITIVE Sensitive     CIPROFLOXACIN <=0.25 SENSITIVE Sensitive     GENTAMICIN <=1 SENSITIVE Sensitive     IMIPENEM <=0.25 SENSITIVE Sensitive     TRIMETH/SULFA <=20 SENSITIVE Sensitive     AMPICILLIN/SULBACTAM 4 SENSITIVE Sensitive     PIP/TAZO <=4 SENSITIVE Sensitive     Extended ESBL NEGATIVE Sensitive     * KLEBSIELLA PNEUMONIAE  Blood Culture (routine x 2)     Status:  Abnormal   Collection Time: 09/16/15  8:30 AM  Result Value Ref Range Status   Specimen Description BLOOD LEFT HAND  Final   Special Requests BOTTLES DRAWN AEROBIC AND ANAEROBIC 5CC  Final   Culture  Setup Time   Final    GRAM NEGATIVE RODS ANAEROBIC BOTTLE ONLY CRITICAL RESULT CALLED TO, READ BACK BY AND VERIFIED WITHHilarie Fredrickson: T EGAN PHARMD 1934 09/16/15 A BROWNING    Culture (A)  Final    KLEBSIELLA PNEUMONIAE SUSCEPTIBILITIES PERFORMED ON PREVIOUS CULTURE WITHIN THE LAST  5 DAYS.    Report Status 09/18/2015 FINAL  Final  Blood Culture ID Panel (Reflexed)     Status: Abnormal   Collection Time: 09/16/15  8:30 AM  Result Value Ref Range Status   Enterococcus species NOT DETECTED NOT DETECTED Final   Vancomycin resistance NOT DETECTED NOT DETECTED Final   Listeria monocytogenes NOT DETECTED NOT DETECTED Final   Staphylococcus species NOT DETECTED NOT DETECTED Final   Staphylococcus aureus NOT DETECTED NOT DETECTED Final   Methicillin resistance NOT DETECTED NOT DETECTED Final   Streptococcus species NOT DETECTED NOT DETECTED Final   Streptococcus agalactiae NOT DETECTED NOT DETECTED Final   Streptococcus pneumoniae NOT DETECTED NOT DETECTED Final   Streptococcus pyogenes NOT DETECTED NOT DETECTED Final   Acinetobacter baumannii NOT DETECTED NOT DETECTED Final   Enterobacteriaceae species DETECTED (A) NOT DETECTED Final    Comment: CRITICAL RESULT CALLED TO, READ BACK BY AND VERIFIED WITH: T EGAN PHARMD 1934 09/16/15 A BROWNING    Enterobacter cloacae complex NOT DETECTED NOT DETECTED Final   Escherichia coli NOT DETECTED NOT DETECTED Final   Klebsiella oxytoca NOT DETECTED NOT DETECTED Final   Klebsiella pneumoniae DETECTED (A) NOT DETECTED Final    Comment: CRITICAL RESULT CALLED TO, READ BACK BY AND VERIFIED WITH: Hilarie Fredrickson PHARMD 1934 09/16/15 A BROWNING    Proteus species NOT DETECTED NOT DETECTED Final   Serratia marcescens NOT DETECTED NOT DETECTED Final   Carbapenem resistance NOT  DETECTED NOT DETECTED Final   Haemophilus influenzae NOT DETECTED NOT DETECTED Final   Neisseria meningitidis NOT DETECTED NOT DETECTED Final   Pseudomonas aeruginosa NOT DETECTED NOT DETECTED Final   Candida albicans NOT DETECTED NOT DETECTED Final   Candida glabrata NOT DETECTED NOT DETECTED Final   Candida krusei NOT DETECTED NOT DETECTED Final   Candida parapsilosis NOT DETECTED NOT DETECTED Final   Candida tropicalis NOT DETECTED NOT DETECTED Final  MRSA PCR Screening     Status: None   Collection Time: 09/16/15  4:50 PM  Result Value Ref Range Status   MRSA by PCR NEGATIVE NEGATIVE Final    Comment:        The GeneXpert MRSA Assay (FDA approved for NASAL specimens only), is one component of a comprehensive MRSA colonization surveillance program. It is not intended to diagnose MRSA infection nor to guide or monitor treatment for MRSA infections.      Studies: No results found.  Scheduled Meds: . cefTRIAXone (ROCEPHIN)  IV  2 g Intravenous Q24H  . pantoprazole  40 mg Oral Daily  . Warfarin - Pharmacist Dosing Inpatient   Does not apply q1800   Continuous Infusions: . sodium chloride 100 mL/hr at 09/20/15 0122  . heparin 1,900 Units/hr (09/19/15 1832)  . lactated ringers Stopped (09/16/15 1600)       Time spent: 25 min    [facility]  [facility] Pager [phone]. If 7PM-7AM, please contact night-coverage at [email or web address], Office  [phone]  password [email or web address] 09/20/2015, 9:27 AM  LOS: 4 days

## 2015-09-20 NOTE — Evaluation (Signed)
Physical Therapy Evaluation Patient Details Name: Caroline MccallumDaisy N Orebaugh MRN: [id number] DOB: 06/14/1945 Today's Date: 09/20/2015   History of Present Illness  70 yo AAF with 3 month hx of right real calculi with on going flank pain. 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 She presents 9/6 with 72hours of increasing rt flank pain, vomiting, diarrhea, and with fever 102 rectal. CT scan abd reveals rt hydronephrosis and obstructing stone rt, s/p stone removal  Clinical Impression   Pt admitted with above diagnosis. Pt currently with functional limitations due to the deficits listed below (see PT Problem List).  Pt will benefit from skilled PT to increase their independence and safety with mobility to allow discharge to the venue listed below.       Follow Up Recommendations SNF    Equipment Recommendations  3in1 (PT) (Wide)    Recommendations for Other Services OT consult     Precautions / Restrictions Precautions Precautions: Fall Restrictions Weight Bearing Restrictions: No      Mobility  Bed Mobility Overal bed mobility: Needs Assistance Bed Mobility: Supine to Sit     Supine to sit: Max assist;Mod assist     General bed mobility comments: Max assist and use of bed pad to scoot hips closer to EOB in prep for getting up; continued use of bed pad and handheld assist angle hip to EOB while pulling to sit; Tending to lean posteriorly once sitting; heavy mod assist to reciprocally scoot hips to EOB and cues to lean forward and press feet into the floor  Transfers Overall transfer level: Needs assistance Equipment used: Rolling walker (2 wheeled) Transfers: Sit to/from Stand Sit to Stand: Mod assist;+2 physical assistance         General transfer comment: Heavy moderate assist of two to power up; cues for hand placement and safety  Ambulation/Gait Ambulation/Gait assistance: +2 physical assistance;Mod assist Ambulation Distance (Feet):   (pivot steps bed to chair) Assistive device: Rolling walker (2 wheeled) Gait Pattern/deviations: Shuffle     General Gait Details: Max encouragement to take steps; difficulty weight shifting entirely onto stance leg for contralateral leg advancement, leadign to shuffle steps  Stairs            Wheelchair Mobility    Modified Rankin (Stroke Patients Only)       Balance Overall balance assessment: Needs assistance   Sitting balance-Leahy Scale: Poor       Standing balance-Leahy Scale: Poor                               Pertinent Vitals/Pain Pain Assessment: 0-10 Pain Score: 9  Pain Location: lower abdominal pain/discomfort pt attributes to foley Pain Descriptors / Indicators: Aching;Grimacing;Guarding;Discomfort Pain Intervention(s): Monitored during session;Repositioned    Home Living Family/patient expects to be discharged to:: Skilled nursing facility Living Arrangements: Children Available Help at Discharge: Family;Available 24 hours/day;Other (Comment) (lives with disabled granddaughter; daughter can help some) Type of Home: House Home Access: Stairs to enter   Entergy CorporationEntrance Stairs-Number of Steps:  (to be determined) Home Layout: Multi-level;Able to live on main level with bedroom/bathroom Home Equipment: Dan HumphreysWalker - 2 wheels      Prior Function Level of Independence: Independent with assistive device(s)         Comments: RW prn     Hand Dominance        Extremity/Trunk Assessment   Upper Extremity Assessment: Generalized weakness  Lower Extremity Assessment: Generalized weakness (and decr hip ROM, limited by body habitus)         Communication   Communication: No difficulties  Cognition Arousal/Alertness: Awake/alert (Occasional cues to open eyes) Behavior During Therapy: [facility] for tasks assessed/performed;Flat affect;Anxious Overall Cognitive Status: Impaired/Different from baseline Area of Impairment: Following  commands;Problem solving       Following Commands: Follows one step commands with increased time     Problem Solving: Slow processing;Decreased initiation;Requires verbal cues;Requires tactile cues General Comments: Hesitant to move and anxious; very concerned about foley catheter; slowness and hesitation may not be related to cognitive status as much as anxiety with moving    General Comments General comments (skin integrity, edema, etc.): Pt tells me she is planning on SNF stay for rehab    Exercises        Assessment/Plan    PT Assessment Patient needs continued PT services  PT Diagnosis Difficulty walking;Generalized weakness;Acute pain   PT Problem List Decreased strength;Decreased range of motion;Decreased activity tolerance;Decreased balance;Decreased mobility;Decreased coordination;Decreased cognition;Decreased knowledge of use of DME;Pain;Obesity  PT Treatment Interventions DME instruction;Gait training;Functional mobility training;Therapeutic activities;Therapeutic exercise;Patient/family education;Cognitive remediation;Balance training   PT Goals (Current goals can be found in the Care Plan section) Acute Rehab PT Goals Patient Stated Goal: get better PT Goal Formulation: With patient Time For Goal Achievement: 10/04/15 Potential to Achieve Goals: Fair    Frequency Min 3X/week   Barriers to discharge        Co-evaluation               End of Session Equipment Utilized During Treatment: Gait belt Activity Tolerance: Patient tolerated treatment well Patient left: in chair;with call bell/phone within reach;with chair alarm set           Time: 5277-8242 PT Time Calculation (min) (ACUTE ONLY): 22 min   Charges:   PT Evaluation $PT Eval Moderate Complexity: 1 Procedure     PT G CodesOlen Pel 09/20/2015, 10:40 AM  Van Clines, PT  Acute Rehabilitation Services Pager [phone] Office [phone]

## 2015-09-21 ENCOUNTER — Inpatient Hospital Stay (HOSPITAL_COMMUNITY): Payer: Medicare Other

## 2015-09-21 ENCOUNTER — Ambulatory Visit: Payer: Medicare Other

## 2015-09-21 DIAGNOSIS — I5042 Chronic combined systolic (congestive) and diastolic (congestive) heart failure: Secondary | ICD-10-CM

## 2015-09-21 DIAGNOSIS — I425 Other restrictive cardiomyopathy: Secondary | ICD-10-CM

## 2015-09-21 LAB — CBC
HEMATOCRIT: 31.6 % — AB (ref 36.0–46.0)
HEMOGLOBIN: 9.5 g/dL — AB (ref 12.0–15.0)
MCH: 24.4 pg — ABNORMAL LOW (ref 26.0–34.0)
MCHC: 30.1 g/dL (ref 30.0–36.0)
MCV: 81 fL (ref 78.0–100.0)
Platelets: 133 10*3/uL — ABNORMAL LOW (ref 150–400)
RBC: 3.9 MIL/uL (ref 3.87–5.11)
RDW: 16.1 % — ABNORMAL HIGH (ref 11.5–15.5)
WBC: 9.8 10*3/uL (ref 4.0–10.5)

## 2015-09-21 LAB — BASIC METABOLIC PANEL
Anion gap: 7 (ref 5–15)
BUN: 5 mg/dL — AB (ref 6–20)
CALCIUM: 8.4 mg/dL — AB (ref 8.9–10.3)
CO2: 30 mmol/L (ref 22–32)
Chloride: 106 mmol/L (ref 101–111)
Creatinine, Ser: 0.86 mg/dL (ref 0.44–1.00)
GFR calc Af Amer: >60 mL/min (ref >60)
GLUCOSE: 109 mg/dL — AB (ref 65–99)
POTASSIUM: 2.8 mmol/L — AB (ref 3.5–5.1)
Sodium: 143 mmol/L (ref 135–145)

## 2015-09-21 LAB — PROTIME-INR
INR: 1.88
Prothrombin Time: 21.9 seconds — ABNORMAL HIGH (ref 11.4–15.2)

## 2015-09-21 LAB — ECHOCARDIOGRAM COMPLETE
HEIGHTINCHES: 63 in
Weight: 4860.7 oz

## 2015-09-21 LAB — HEPARIN LEVEL (UNFRACTIONATED): HEPARIN UNFRACTIONATED: 0.23 [IU]/mL — AB (ref 0.30–0.70)

## 2015-09-21 LAB — BRAIN NATRIURETIC PEPTIDE: B NATRIURETIC PEPTIDE 5: 251.8 pg/mL — AB (ref 0.0–100.0)

## 2015-09-21 MED ORDER — CEFUROXIME AXETIL 500 MG PO TABS
500.0000 mg | ORAL_TABLET | Freq: Two times a day (BID) | ORAL | Status: DC
  Administered 2015-09-21 – 2015-09-23 (×5): 500 mg via ORAL
  Filled 2015-09-21 (×5): qty 1

## 2015-09-21 MED ORDER — POTASSIUM CHLORIDE 10 MEQ/100ML IV SOLN
10.0000 meq | INTRAVENOUS | Status: AC
  Administered 2015-09-21 – 2015-09-22 (×4): 10 meq via INTRAVENOUS
  Filled 2015-09-21 (×4): qty 100

## 2015-09-21 MED ORDER — WARFARIN SODIUM 4 MG PO TABS
4.0000 mg | ORAL_TABLET | Freq: Once | ORAL | Status: AC
  Administered 2015-09-21: 4 mg via ORAL
  Filled 2015-09-21: qty 1

## 2015-09-21 MED ORDER — DM-GUAIFENESIN ER 30-600 MG PO TB12
1.0000 | ORAL_TABLET | Freq: Two times a day (BID) | ORAL | Status: DC
  Administered 2015-09-21 – 2015-09-23 (×5): 1 via ORAL
  Filled 2015-09-21 (×5): qty 1

## 2015-09-21 MED ORDER — FUROSEMIDE 10 MG/ML IJ SOLN
20.0000 mg | Freq: Two times a day (BID) | INTRAMUSCULAR | Status: DC
  Administered 2015-09-21 – 2015-09-22 (×3): 20 mg via INTRAVENOUS
  Filled 2015-09-21 (×3): qty 2

## 2015-09-21 MED ORDER — CIPROFLOXACIN HCL 500 MG PO TABS
750.0000 mg | ORAL_TABLET | Freq: Two times a day (BID) | ORAL | Status: DC
  Filled 2015-09-21: qty 1

## 2015-09-21 MED ORDER — HYDROCODONE-ACETAMINOPHEN 5-325 MG PO TABS
1.0000 | ORAL_TABLET | Freq: Four times a day (QID) | ORAL | Status: DC | PRN
Start: 2015-09-21 — End: 2015-09-23
  Administered 2015-09-21 – 2015-09-22 (×2): 1 via ORAL
  Filled 2015-09-21 (×2): qty 1

## 2015-09-21 MED ORDER — ONDANSETRON HCL 4 MG/2ML IJ SOLN
4.0000 mg | Freq: Four times a day (QID) | INTRAMUSCULAR | Status: DC
  Administered 2015-09-21 – 2015-09-22 (×5): 4 mg via INTRAVENOUS
  Filled 2015-09-21 (×5): qty 2

## 2015-09-21 MED ORDER — FUROSEMIDE 10 MG/ML IJ SOLN
40.0000 mg | Freq: Once | INTRAMUSCULAR | Status: AC
Start: 2015-09-21 — End: 2015-09-21
  Administered 2015-09-21: 40 mg via INTRAVENOUS
  Filled 2015-09-21: qty 4

## 2015-09-21 NOTE — [place] FL2 (Signed)
[place] MEDICAID FL2 LEVEL OF CARE SCREENING TOOL     IDENTIFICATION  Patient Name: Caroline MccallumDaisy N Puccini Birthdate: 02/22/1945 Sex: female Admission Date (Current Location): 09/16/2015  [facility] [place]   Facility and Address:  [facility], [address], [place], [place] [address]      Provider Number: [id number]  Attending Physician Name and Address:  Meredeth IdeGagan S Lama, MD  Relative Name and Phone Number:  Everlean PattersonMary Davie - daughter.  [phone] (cell)    Current Level of Care: Hospital Recommended Level of Care: Skilled Nursing Facility Prior Approval Number:    Date Approved/Denied:   PASRR Number: [id number] A (Eff. 09/21/15)  Discharge Plan: SNF    Current Diagnoses: Patient Active Problem List   Diagnosis Date Noted  . Congestive heart failure (HCC) 09/21/2015  . Surgery, elective   . Right ureteral stone   . Sepsis (HCC) 09/16/2015  . History of exertional chest pain 08/26/2013  . DVT, lower extremity, recurrent (HCC)   . Vertigo 09/13/2011  . Healthcare maintenance 09/13/2011  . Allergic rhinitis 06/21/2006  . Hyperlipidemia 06/10/2006  . Essential hypertension 06/10/2006  . Mild persistent asthma in adult without complication 06/10/2006    Orientation RESPIRATION BLADDER Height & Weight     Self, Time, Situation, Place  Normal Continent Weight: (!) 303 lb 12.7 oz (137.8 kg) Height:  5\' 3"  (160 cm)  BEHAVIORAL SYMPTOMS/MOOD NEUROLOGICAL BOWEL NUTRITION STATUS      Continent Diet (9/7-patient on clear liquid diet; 9/9 MD order to advance diet as tolerated)  AMBULATORY STATUS COMMUNICATION OF NEEDS Skin   Extensive Assist Verbally Other (Comment) (Incision vagina)                       Personal Care Assistance Level of Assistance  Bathing, Feeding, Dressing Bathing Assistance: Maximum assistance Feeding assistance: Limited assistance Dressing Assistance: Maximum assistance     Functional Limitations Info   Sight, Hearing, Speech Sight Info: Adequate Hearing Info: Adequate Speech Info: Adequate    SPECIAL CARE FACTORS FREQUENCY  PT (By licensed PT)     PT Frequency: Evaluated 9/10 and a minimum of 3X per week therapy reocmmended              Contractures Contractures Info: Not present    Additional Factors Info  Code Status, Allergies Code Status Info: Full Allergies Info: Penicillins, shellfish, tomato            Current Medications (09/21/2015):  This is the current hospital active medication list Current Facility-Administered Medications  Medication Dose Route Frequency Provider Last Rate Last Dose  . cefUROXime (CEFTIN) tablet 500 mg  500 mg Oral BID WC Meredeth IdeGagan S Lama, MD   500 mg at 09/21/15 1139  . dextromethorphan-guaiFENesin (MUCINEX DM) 30-600 MG per 12 hr tablet 1 tablet  1 tablet Oral BID Meredeth IdeGagan S Lama, MD   1 tablet at 09/21/15 1139  . fentaNYL (SUBLIMAZE) injection 25-100 mcg  25-100 mcg Intravenous Q2H PRN Vilinda BlanksWilliam S Minor, NP      . furosemide (LASIX) injection 20 mg  20 mg Intravenous Q12H Meredeth IdeGagan S Lama, MD      . HYDROcodone-acetaminophen (NORCO/VICODIN) 5-325 MG per tablet 1 tablet  1 tablet Oral Q6H PRN Meredeth IdeGagan S Lama, MD      . lactated ringers infusion   Intravenous Continuous Linton RumpJennifer Dickerson Allan, MD   Stopped at 09/16/15 1600  . pantoprazole (PROTONIX) EC tablet 40 mg  40 mg  Oral Daily Nelda Bucks, MD   40 mg at 09/21/15 1140  . warfarin (COUMADIN) tablet 4 mg  4 mg Oral ONCE-1800 Lauren D Bajbus, RPH      . Warfarin - Pharmacist Dosing Inpatient   Does not apply q1800 Tenna Child, [facility]         Discharge Medications: Please see discharge summary for a list of discharge medications.  Relevant Imaging Results:  Relevant Lab Results:   Additional Information ss#[id number].   Cristobal Goldmann, LCSW

## 2015-09-21 NOTE — Progress Notes (Signed)
  Echocardiogram 2D Echocardiogram has been performed.  Janalyn Harder 09/21/2015, 4:28 PM

## 2015-09-21 NOTE — Progress Notes (Signed)
ANTICOAGULATION CONSULT NOTE - Follow Up Consult  Pharmacy Consult for heparin Indication: h/o DVT and PE  Labs:  Recent Labs  09/19/15 0321 09/20/15 0541 09/21/15 0417  HGB 9.7* 9.3* 9.5*  HCT 31.4* 30.4* 31.6*  PLT 101* 130* 133*  LABPROT  --  21.8* 21.9*  INR  --  1.88 1.88  HEPARINUNFRC 0.37 0.36 0.23*  CREATININE 0.75  --   --      Assessment: 70yo female now subtherapeutic on heparin after three levels at goal at current rate though had been at low end of goal.  Goal of Therapy:  Heparin level 0.3-0.7 units/ml   Plan:  Will increase heparin gtt by 2 units/kg/hr to 2200 units/hr and check level in 6hr.  Vernard Gambles, PharmD, BCPS  09/21/2015,6:20 AM

## 2015-09-21 NOTE — Progress Notes (Signed)
PT Cancellation Note  Patient Details Name: Caroline [name] MRN: [id number] DOB: [birth year]   Cancelled Treatment:    Reason Eval/Treat Not Completed: Other (comment)   Politely declining PT and getting OOB;   Will follow up tomorrow;   Van Clines, PT  Acute Rehabilitation Services Pager [phone] Office [phone]    Van Clines [facility] 09/21/2015, 3:39 PM

## 2015-09-21 NOTE — Clinical Social Work Note (Signed)
Clinical Social Work Assessment  Patient Details  Name: Caroline [name] MRN: [id number] Date of Birth: 07-06-1945  Date of referral:  09/21/15               Reason for consult:  Facility Placement                Permission sought to share information with:  Family Supports Permission granted to share information::  No (CSW talked with patient's daughter as patient was asleep)  Name::     Everlean Patterson  Agency::     Relationship::  Daughter  Contact Information:  [phone] - mobile  Housing/Transportation Living arrangements for the past 2 months:  Single Family Home Source of Information:  Adult Children (Daughter Corrie Dandy) Patient Interpreter Needed:  None Criminal Activity/Legal Involvement Pertinent to Current Situation/Hospitalization:  No - Comment as needed Significant Relationships:  Adult Children, Other Family Members, Friend Lives with:  Relatives, Friends (Per daughter, her dad-Morgan Arvilla Market (patient & Mr. Arvilla Market not married) lives with her mother in non-romantic relationship) Do you feel safe going back to the place where you live?  Yes Need for family participation in patient care:  Yes (Comment)  Care giving concerns:  Daughter Corrie Dandy in agreement with patient going to rehab before returning home.   Social Worker assessment / plan:  Visit made to patient's room to discuss discharge planning and recommendation of ST rehab, however she was asleep and did not awaken when called. Contacted daughter Eshana Kolesar by phone to discuss discharge planning. Ms. Purdy was very receptive to talking with CSW and in agreement with ST rehab for her mother before she returns home.  CSW informed that her dad lives with her mother, however they are not romantically involved. In addition, patient's niece Arvella Nigh also lives with her mother. Daughter indicated that her mother has never been to rehab before and the SNF search process explained and SNF list emailed to daughter  [email or web address]).    Employment status:  Retired Database administrator, Medicaid In [employer] PT Recommendations:  Skilled Nursing Facility Information / Referral to community resources:  Skilled Holiday representative (Daughter emailed SNF list for [facility])  Patient/Family's Response to care:  Daughter expressed no concerns regarding patient's care during hospitalization.  Patient/Family's Understanding of and Emotional Response to Diagnosis, Current Treatment, and Prognosis:  Not discussed.  Emotional Assessment Appearance:  Appears stated age Attitude/Demeanor/Rapport:  Unable to Assess (Patient asleep) Affect (typically observed):  Unable to Assess (Patient asleep) Orientation:  Oriented to Self, Oriented to Place, Oriented to  Time, Oriented to Situation Alcohol / Substance use:  Tobacco Use, Alcohol Use, Illicit Drugs (Patient reports that she quit smoking, and does not drink or use illicit drugs) Psych involvement (Current and /or in the community):  No (Comment)  Discharge Needs  Concerns to be addressed:  Discharge Planning Concerns Readmission within the last 30 days:  No Current discharge risk:  None Barriers to Discharge:  No Barriers Identified   Cristobal Goldmann, LCSW 09/21/2015, 7:17 PM

## 2015-09-21 NOTE — Progress Notes (Signed)
[facility]  PROGRESS NOTE  Caroline [name]:[id number] DOB: 09/19/1945 DOA: 09/16/2015 PCP: Rich Number, MD    Brief HPI:   70 yo AAF with 3 month hx of right real calculi with on going flank pain. 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 She presents 9/6 with 72hours of increasing rt flank pain, vomiting, diarrhea, and with fever 102 rectal. CT scan abd reveals rt hydronephrosis and obstructing stone rt. She has had 4 litre of IVF and has sbp 109. PCCM admitted the patient to ICU,off neo, post right renal stone removal.  Hospitalist service resumed care on 09/18/2015    Assessment/Plan:    1. Sepsis- improving, from right renal stone, hydronephrosis. Blood cultures growing Klebsiella pneumoniae, will  cefepime has been switched to ceftriaxone. Vancomycin has been discontinued.Patient started on Ceftin 500 mg by mouth twice a day. 2. Right renal stone/hydronephrosis- status post cystoscopy, right retrograde ureterogram and insertion of right ureteral stent. Urology has signed off and follow patient in 2 weeks 3. ? Congestive heart failure- patient has bibasilar crackles, obtain echocardiogram, check BNP. Start Lasix 40 mg IV 1 now and then 20 mg IV every 12 hours from tonight. 4. History of pulmonary embolism-  patient was on Coumadin, she was started on heparin protocol. INR is 1.8, heparin has been discontinued. 5. Hypokalemia- replete    DVT prophylaxis: Heparin Code Status: Full code Family Communication: Discussed with patient's daughter and son at bedside on 09/19/15. Disposition Plan: Schedule same facility   Consultants:  [facility]  Urology  Procedures: SIGNIFICANT EVENTS: 9/6 admit to ICU. 9/6 urology consult taken to OR for stone removal  9/7- remained in shock on neo  Antibiotics: 9/6 vanc>>>9/7 9/6 cefepime>> 9/8 Ceftriaxone-9/8 >> 9/11 Ceftin- 9/11>>  Subjective   Patient seen and examined, Still complains of  right flank pain on urination. Also has been very lethargic. Patient has been on 2 L of oxygen via nasal cannula. She is not on home oxygen. Chest x-ray on 09/16/2015 showed congestive heart failure/interstitial edema.  Objective    Objective: Vitals:   09/20/15 2224 09/21/15 0500 09/21/15 0520 09/21/15 0942  BP: (!) 146/73  (!) 151/70 (!) 150/72  Pulse: 93  91 82  Resp: 20  20 17   Temp: 97.9 F (36.6 C)  99 F (37.2 C) 98.9 F (37.2 C)  TempSrc: Oral  Oral Oral  SpO2: 99%  100% 100%  Weight:  (!) 137.8 kg (303 lb 12.7 oz)    Height:        Intake/Output Summary (Last 24 hours) at 09/21/15 1232 Last data filed at 09/21/15 0600  Gross per 24 hour  Intake             3272 ml  Output              476 ml  Net             2796 ml   Filed Weights   09/19/15 2100 09/20/15 0244 09/21/15 0500  Weight: (!) 137.8 kg (303 lb 11.2 oz) (!) 137.8 kg (303 lb 12.7 oz) (!) 137.8 kg (303 lb 12.7 oz)    Examination:  General exam: Appears calm and comfortable  Respiratory system: Bilateral crackles Respiratory effort normal. Cardiovascular system: S1 & S2 heard, RRR. No JVD, murmurs, rubs, gallops or clicks. No pedal edema. Gastrointestinal system: Abdomen is nondistended, soft and + right flank tenderness. No organomegaly or masses felt. Normal bowel sounds  heard. Central nervous system: Alert and oriented. No focal neurological deficits. Extremities: Symmetric 5 x 5 power. Skin: No rashes, lesions or ulcers Psychiatry: Judgement and insight appear normal. Mood & affect appropriate.    Data Reviewed: I have personally reviewed following labs and imaging studies Basic Metabolic Panel:  Recent Labs Lab 09/16/15 0538 09/17/15 0205 09/18/15 0246 09/19/15 0321  NA 140 140 141 144  K 3.5 3.9 3.4* 3.7  CL 99* 111 111 115*  CO2 26 21* 22 25  GLUCOSE 198* 176* 150* 134*  BUN 19 19 17 8   CREATININE 1.24* 1.24* 0.98 0.75  CALCIUM 9.0 7.2* 7.3* 7.8*   Liver Function Tests:  Recent  Labs Lab 09/16/15 0538  AST 27  ALT 25  ALKPHOS 75  BILITOT 0.6  PROT 7.5  ALBUMIN 3.7    Recent Labs Lab 09/16/15 0538  LIPASE 36   No results for input(s): AMMONIA in the last 168 hours. CBC:  Recent Labs Lab 09/17/15 0205 09/18/15 0246 09/19/15 0321 09/20/15 0541 09/21/15 0417  WBC 22.1* 15.6* 14.6* 11.6* 9.8  HGB 10.3* 9.9* 9.7* 9.3* 9.5*  HCT 33.7* 32.4* 31.4* 30.4* 31.6*  MCV 81.4 80.6 80.3 79.4 81.0  PLT 140* 104* 101* 130* 133*   Cardiac Enzymes:  Recent Labs Lab 09/16/15 1435  TROPONINI 0.10*    CBG:  Recent Labs Lab 09/16/15 1348 09/16/15 1549  GLUCAP 177* 163*    Recent Results (from the past 240 hour(s))  Urine culture     Status: Abnormal   Collection Time: 09/16/15  5:53 AM  Result Value Ref Range Status   Specimen Description URINE, RANDOM  Final   Special Requests NONE  Final   Culture MULTIPLE SPECIES PRESENT, SUGGEST RECOLLECTION (A)  Final   Report Status 09/17/2015 FINAL  Final  Blood Culture (routine x 2)     Status: Abnormal   Collection Time: 09/16/15  8:15 AM  Result Value Ref Range Status   Specimen Description BLOOD LEFT ANTECUBITAL  Final   Special Requests BOTTLES DRAWN AEROBIC AND ANAEROBIC 5CC  Final   Culture  Setup Time   Final    GRAM NEGATIVE RODS IN BOTH AEROBIC AND ANAEROBIC BOTTLES CRITICAL RESULT CALLED TO, READ BACK BY AND VERIFIED WITH: T. EGAN PHARMD, AT [id number] 09/16/15 A. BROWNING    Culture KLEBSIELLA PNEUMONIAE (A)  Final   Report Status 09/18/2015 FINAL  Final   Organism ID, Bacteria KLEBSIELLA PNEUMONIAE  Final      Susceptibility   Klebsiella pneumoniae - MIC*    AMPICILLIN 16 RESISTANT Resistant     CEFAZOLIN <=4 SENSITIVE Sensitive     CEFEPIME <=1 SENSITIVE Sensitive     CEFTAZIDIME <=1 SENSITIVE Sensitive     CEFTRIAXONE <=1 SENSITIVE Sensitive     CIPROFLOXACIN <=0.25 SENSITIVE Sensitive     GENTAMICIN <=1 SENSITIVE Sensitive     IMIPENEM <=0.25 SENSITIVE Sensitive     TRIMETH/SULFA <=20  SENSITIVE Sensitive     AMPICILLIN/SULBACTAM 4 SENSITIVE Sensitive     PIP/TAZO <=4 SENSITIVE Sensitive     Extended ESBL NEGATIVE Sensitive     * KLEBSIELLA PNEUMONIAE  Blood Culture (routine x 2)     Status: Abnormal   Collection Time: 09/16/15  8:30 AM  Result Value Ref Range Status   Specimen Description BLOOD LEFT HAND  Final   Special Requests BOTTLES DRAWN AEROBIC AND ANAEROBIC 5CC  Final   Culture  Setup Time   Final    GRAM NEGATIVE RODS ANAEROBIC BOTTLE  ONLY CRITICAL RESULT CALLED TO, READ BACK BY AND VERIFIED WITH: Hilarie Fredrickson PHARMD 1934 09/16/15 A BROWNING    Culture (A)  Final    KLEBSIELLA PNEUMONIAE SUSCEPTIBILITIES PERFORMED ON PREVIOUS CULTURE WITHIN THE LAST 5 DAYS.    Report Status 09/18/2015 FINAL  Final  Blood Culture ID Panel (Reflexed)     Status: Abnormal   Collection Time: 09/16/15  8:30 AM  Result Value Ref Range Status   Enterococcus species NOT DETECTED NOT DETECTED Final   Vancomycin resistance NOT DETECTED NOT DETECTED Final   Listeria monocytogenes NOT DETECTED NOT DETECTED Final   Staphylococcus species NOT DETECTED NOT DETECTED Final   Staphylococcus aureus NOT DETECTED NOT DETECTED Final   Methicillin resistance NOT DETECTED NOT DETECTED Final   Streptococcus species NOT DETECTED NOT DETECTED Final   Streptococcus agalactiae NOT DETECTED NOT DETECTED Final   Streptococcus pneumoniae NOT DETECTED NOT DETECTED Final   Streptococcus pyogenes NOT DETECTED NOT DETECTED Final   Acinetobacter baumannii NOT DETECTED NOT DETECTED Final   Enterobacteriaceae species DETECTED (A) NOT DETECTED Final    Comment: CRITICAL RESULT CALLED TO, READ BACK BY AND VERIFIED WITH: T EGAN PHARMD 1934 09/16/15 A BROWNING    Enterobacter cloacae complex NOT DETECTED NOT DETECTED Final   Escherichia coli NOT DETECTED NOT DETECTED Final   Klebsiella oxytoca NOT DETECTED NOT DETECTED Final   Klebsiella pneumoniae DETECTED (A) NOT DETECTED Final    Comment: CRITICAL RESULT CALLED  TO, READ BACK BY AND VERIFIED WITH: Hilarie Fredrickson PHARMD 1934 09/16/15 A BROWNING    Proteus species NOT DETECTED NOT DETECTED Final   Serratia marcescens NOT DETECTED NOT DETECTED Final   Carbapenem resistance NOT DETECTED NOT DETECTED Final   Haemophilus influenzae NOT DETECTED NOT DETECTED Final   Neisseria meningitidis NOT DETECTED NOT DETECTED Final   Pseudomonas aeruginosa NOT DETECTED NOT DETECTED Final   Candida albicans NOT DETECTED NOT DETECTED Final   Candida glabrata NOT DETECTED NOT DETECTED Final   Candida krusei NOT DETECTED NOT DETECTED Final   Candida parapsilosis NOT DETECTED NOT DETECTED Final   Candida tropicalis NOT DETECTED NOT DETECTED Final  MRSA PCR Screening     Status: None   Collection Time: 09/16/15  4:50 PM  Result Value Ref Range Status   MRSA by PCR NEGATIVE NEGATIVE Final    Comment:        The GeneXpert MRSA Assay (FDA approved for NASAL specimens only), is one component of a comprehensive MRSA colonization surveillance program. It is not intended to diagnose MRSA infection nor to guide or monitor treatment for MRSA infections.      Studies: No results found.  Scheduled Meds: . cefUROXime  500 mg Oral BID WC  . dextromethorphan-guaiFENesin  1 tablet Oral BID  . furosemide  20 mg Intravenous Q12H  . pantoprazole  40 mg Oral Daily  . warfarin  4 mg Oral ONCE-1800  . Warfarin - Pharmacist Dosing Inpatient   Does not apply q1800   Continuous Infusions: . lactated ringers Stopped (09/16/15 1600)       Time spent: 25 min    [facility]  [facility] Pager [phone]. If 7PM-7AM, please contact night-coverage at [email or web address], Office  [phone]  password [email or web address] 09/21/2015, 12:32 PM  LOS: 5 days

## 2015-09-21 NOTE — Clinical Social Work Placement (Signed)
   CLINICAL SOCIAL WORK PLACEMENT  NOTE  Date:  09/21/2015  Patient Details  Name: Caroline [name] MRN: [id number] Date of Birth: 10-23-1945  Clinical Social Work is seeking post-discharge placement for this patient at the Skilled  Nursing Facility level of care (*CSW will initial, date and re-position this form in  chart as items are completed):  Yes (Daughter emailed SNF list for [facility])   Patient/family provided with [facility] Clinical Social Work Department's list of facilities offering this level of care within the geographic area requested by the patient (or if unable, by the patient's family).  Yes   Patient/family informed of their freedom to choose among providers that offer the needed level of care, that participate in Medicare, Medicaid or managed care program needed by the patient, have an available bed and are willing to accept the patient.  Yes (Per list provided)   Patient/family informed of [facility]'s ownership interest in [facility] and [facility], as well as of the fact that they are under no obligation to receive care at these facilities.  PASRR submitted to EDS on 09/21/15     PASRR number received on 09/21/15     Existing PASRR number confirmed on       FL2 transmitted to all facilities in geographic area requested by pt/family on 09/21/15     FL2 transmitted to all facilities within larger geographic area on       Patient informed that his/her managed care company has contracts with or will negotiate with certain facilities, including the following:        Yes (09/21/15)   Patient/family informed of bed offers received.  Patient chooses bed at       Physician recommends and patient chooses bed at      Patient to be transferred to   on  .  Patient to be transferred to facility by       Patient family notified on   of transfer.  Name of family member notified:        PHYSICIAN       Additional Comment:     _______________________________________________ Cristobal Goldmann, LCSW 09/21/2015, 7:29 PM

## 2015-09-21 NOTE — Progress Notes (Addendum)
ANTICOAGULATION CONSULT NOTE   Pharmacy Consult for warfarin Indication: h/o DVT and PE  Allergies  Allergen Reactions  . Penicillins Swelling  . Shellfish Allergy Swelling  . Tomato Swelling    Patient Measurements: Height: 5\' 3"  (160 cm) Weight: (!) 303 lb 12.7 oz (137.8 kg) IBW/kg (Calculated) : 52.4 Heparin Dosing Weight: 86 kg  Vital Signs: Temp: 98.9 F (37.2 C) (09/11 0942) Temp Source: Oral (09/11 0942) BP: 150/72 (09/11 0942) Pulse Rate: 82 (09/11 0942)  Labs:  Recent Labs  09/19/15 0321 09/20/15 0541 09/21/15 0417  HGB 9.7* 9.3* 9.5*  HCT 31.4* 30.4* 31.6*  PLT 101* 130* 133*  LABPROT  --  21.8* 21.9*  INR  --  1.88 1.88  HEPARINUNFRC 0.37 0.36 0.23*  CREATININE 0.75  --   --     Estimated Creatinine Clearance: 92 mL/min (by C-G formula based on SCr of 0.8 mg/dL).  Assessment: 70yo F on warfarin PTA for history of DVT and PE.  Patient was started on heparin drip, however since this is now new clot she does not need to complete a total bridge prior to discharge.  INR 1.88. PTA Warfarin Dose: 2mg  daily except 4mg  MWF  Goal of Therapy:  INR 2-3 Monitor platelets by anticoagulation protocol: Yes   Plan:  -discontinue heparin -Warfarin 4mg  po x1 tonight ordered -Recommend patient is discharged on previous dose of warfarin- 2mg  daily except 4mg  on MWF with an INR check on 9/13 or 9/14 -Daily INR, CBC -Monitor s/sx bleeding  Myrtice Lowdermilk D. Shanya Ferriss, PharmD, BCPS Clinical Pharmacist Pager: [phone] 09/21/2015 10:21 AM

## 2015-09-21 NOTE — Progress Notes (Addendum)
Pharmacy Antibiotic Note  Caroline [name] is a 70 y.o. female admitted on 09/16/2015 with sepsis 2/2 right ureteral stone, found to have klebsiella pneumoniae bacteremia.   Was on ceftriaxone, but now tolerating POs and after discussion with Dr. Sharl Ma, decision made to change to PO antibiotics.  Plan: -Ceftin 500mg  PO BID x9 days for a total of 14 days of antibiotic treatment for klebsiella pneumoniae bacteremia  Height: 5\' 3"  (160 cm) Weight: (!) 303 lb 12.7 oz (137.8 kg) IBW/kg (Calculated) : 52.4  Temp (24hrs), Avg:98.5 F (36.9 C), Min:97.9 F (36.6 C), Max:99 F (37.2 C)   Recent Labs Lab 09/16/15 0538  09/16/15 1113 09/16/15 1435 09/16/15 2019 09/16/15 2325 09/17/15 0205 09/17/15 1228 09/18/15 0246 09/19/15 0321 09/20/15 0541 09/21/15 0417  WBC 9.4  --   --  10.6*  --   --  22.1*  --  15.6* 14.6* 11.6* 9.8  CREATININE 1.24*  --   --   --   --   --  1.24*  --  0.98 0.75  --   --   LATICACIDVEN  --   < > 5.05* 4.8* 2.6* 2.7*  --  1.7  --   --   --   --   < > = values in this interval not displayed.  Estimated Creatinine Clearance: 92 mL/min (by C-G formula based on SCr of 0.8 mg/dL).    Allergies  Allergen Reactions  . Penicillins Swelling  . Shellfish Allergy Swelling  . Tomato Swelling    Antimicrobials this admission: Ceftin 9/11>>(9/19) Ceftriaxone 9/8 >>9/11 Cefepime 9/6 >> 9/8 Vanc 9/6 >> 9/7  Dose adjustments this admission: Cefepime 2g q12h >> q8h  Microbiology results: 9/6 Blood x 2 - klebsiella pneumoniae (R-amp) 2/2 9/6 UCx: mult species MRSA PCR: neg  Steph Cheadle D. Elyana Grabski, PharmD, BCPS Clinical Pharmacist Pager: [phone] 09/21/2015 10:19 AM

## 2015-09-22 DIAGNOSIS — I5043 Acute on chronic combined systolic (congestive) and diastolic (congestive) heart failure: Secondary | ICD-10-CM

## 2015-09-22 LAB — BASIC METABOLIC PANEL
ANION GAP: 8 (ref 5–15)
BUN: 6 mg/dL (ref 6–20)
CHLORIDE: 106 mmol/L (ref 101–111)
CO2: 30 mmol/L (ref 22–32)
Calcium: 8.4 mg/dL — ABNORMAL LOW (ref 8.9–10.3)
Creatinine, Ser: 0.87 mg/dL (ref 0.44–1.00)
GFR calc Af Amer: >60 mL/min (ref >60)
GLUCOSE: 100 mg/dL — AB (ref 65–99)
POTASSIUM: 3 mmol/L — AB (ref 3.5–5.1)
Sodium: 144 mmol/L (ref 135–145)

## 2015-09-22 LAB — CBC
HEMATOCRIT: 31.5 % — AB (ref 36.0–46.0)
Hemoglobin: 9.5 g/dL — ABNORMAL LOW (ref 12.0–15.0)
MCH: 24.2 pg — AB (ref 26.0–34.0)
MCHC: 30.2 g/dL (ref 30.0–36.0)
MCV: 80.2 fL (ref 78.0–100.0)
Platelets: 173 10*3/uL (ref 150–400)
RBC: 3.93 MIL/uL (ref 3.87–5.11)
RDW: 15.7 % — AB (ref 11.5–15.5)
WBC: 7.9 10*3/uL (ref 4.0–10.5)

## 2015-09-22 LAB — PROTIME-INR
INR: 2.06
Prothrombin Time: 23.6 seconds — ABNORMAL HIGH (ref 11.4–15.2)

## 2015-09-22 MED ORDER — WARFARIN SODIUM 2 MG PO TABS
2.0000 mg | ORAL_TABLET | ORAL | Status: DC
  Administered 2015-09-22: 2 mg via ORAL
  Filled 2015-09-22 (×2): qty 1

## 2015-09-22 MED ORDER — WARFARIN SODIUM 4 MG PO TABS
4.0000 mg | ORAL_TABLET | ORAL | Status: DC
  Filled 2015-09-22: qty 1

## 2015-09-22 MED ORDER — INFLUENZA VAC SPLIT QUAD 0.5 ML IM SUSY
0.5000 mL | PREFILLED_SYRINGE | INTRAMUSCULAR | Status: DC
  Filled 2015-09-22: qty 0.5

## 2015-09-22 MED ORDER — POTASSIUM CHLORIDE CRYS ER 20 MEQ PO TBCR
40.0000 meq | EXTENDED_RELEASE_TABLET | ORAL | Status: AC
  Administered 2015-09-22 (×2): 40 meq via ORAL
  Filled 2015-09-22 (×2): qty 2

## 2015-09-22 NOTE — Clinical Social Work Placement (Signed)
   CLINICAL SOCIAL WORK PLACEMENT  NOTE 9/12 - FACILITY CHOICES PROVIDED BY DAUGHTER - SEE BELOW  Date:  09/22/2015  Patient Details  Name: Caroline [name] MRN: [id number] Date of Birth: 06/17/1945  Clinical Social Work is seeking post-discharge placement for this patient at the Skilled  Nursing Facility level of care (*CSW will initial, date and re-position this form in  chart as items are completed):  Yes (Daughter emailed SNF list for [place])   Patient/family provided with [facility] Clinical Social Work Department's list of facilities offering this level of care within the geographic area requested by the patient (or if unable, by the patient's family).  Yes   Patient/family informed of their freedom to choose among providers that offer the needed level of care, that participate in Medicare, Medicaid or managed care program needed by the patient, have an available bed and are willing to accept the patient.  Yes (Per list provided)   Patient/family informed of [facility]'s ownership interest in [facility] and [facility], as well as of the fact that they are under no obligation to receive care at these facilities.  PASRR submitted to EDS on 09/21/15     PASRR number received on 09/21/15     Existing PASRR number confirmed on       FL2 transmitted to all facilities in geographic area requested by pt/family on 09/21/15     FL2 transmitted to all facilities within larger geographic area on       Patient informed that his/her managed care company has contracts with or will negotiate with certain facilities, including the following:        Yes (09/21/15)   Patient/family informed of bed offers received. Daughter provided facility choices on 9/12 I order of  Preference: [facility].   Patient chooses bed at       Physician recommends and patient chooses bed at      Patient to be transferred to   on  .  Patient to be transferred to facility by        Patient family notified on   of transfer.  Name of family member notified:        PHYSICIAN       Additional Comment:    _______________________________________________ Cristobal Goldmann, LCSW 09/22/2015, 10:07 PM

## 2015-09-22 NOTE — Progress Notes (Signed)
ANTICOAGULATION CONSULT NOTE   Pharmacy Consult for warfarin Indication: h/o DVT and PE  Allergies  Allergen Reactions  . Penicillins Swelling  . Shellfish Allergy Swelling  . Tomato Swelling    Patient Measurements: Height: 5\' 3"  (160 cm) Weight: 289 lb 8 oz (131.3 kg) IBW/kg (Calculated) : 52.4 Heparin Dosing Weight: 86 kg  Vital Signs: Temp: 99.5 F (37.5 C) (09/12 0550) Temp Source: Oral (09/12 0550) BP: 146/63 (09/12 0550) Pulse Rate: 88 (09/12 0550)  Labs:  Recent Labs  09/20/15 0541 09/21/15 0417 09/21/15 1913 09/22/15 0431  HGB 9.3* 9.5*  --  9.5*  HCT 30.4* 31.6*  --  31.5*  PLT 130* 133*  --  173  LABPROT 21.8* 21.9*  --  23.6*  INR 1.88 1.88  --  2.06  HEPARINUNFRC 0.36 0.23*  --   --   CREATININE  --   --  0.86 0.87    Estimated Creatinine Clearance: 82.1 mL/min (by C-G formula based on SCr of 0.87 mg/dL).  Assessment: 70yo F on warfarin PTA for history of DVT and PE.  Patient was started on heparin drip, however since this is now new clot she does not need to complete a total bridge prior to discharge, thus heparin was discontinued yesterday. She was started on Ceftin yesterday which may affect INR, however less likely than risk with other antibiotics. INR 2.06 PTA Warfarin Dose: 2mg  daily except 4mg  MWF  Goal of Therapy:  INR 2-3 Monitor platelets by anticoagulation protocol: Yes   Plan:  -Restart home dose of warfarin- 2mg  daily except 4mg  on MWF. -Daily INR, CBC -Monitor s/sx bleeding  Elynore Dolinski D. Nikkolas Coomes, PharmD, BCPS Clinical Pharmacist Pager: [phone] 09/22/2015 10:26 AM

## 2015-09-22 NOTE — Progress Notes (Signed)
Physical Therapy Treatment Patient Details Name: Caroline MccallumDaisy N Say MRN: [id number] DOB: 06/18/1945 Today's Date: 09/22/2015    History of Present Illness 70 yo AAF with 3 month hx of right real calculi with on going flank pain. 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 She presents 9/6 with 72hours of increasing rt flank pain, vomiting, diarrhea, and with fever 102 rectal. CT scan abd reveals rt hydronephrosis and obstructing stone rt, s/p stone removal    PT Comments    Needing max encouragement and education re: the deleterious effects of bedrest to convince pt to participate and get OOB; Walked more than yesterday; Making progress, but slowly  Follow Up Recommendations  SNF     Equipment Recommendations  3in1 (PT) (Wide )    Recommendations for Other Services       Precautions / Restrictions Precautions Precautions: Fall Precaution Comments: Strict I and Os; at this point, use supplemental O2 Restrictions Weight Bearing Restrictions: No    Mobility  Bed Mobility Overal bed mobility: Needs Assistance Bed Mobility: Supine to Sit     Supine to sit: Max assist;Mod assist     General bed mobility comments: Max assist and use of bed pad to scoot hips closer to EOB in prep for getting up; continued use of bed pad and handheld assist angle hip to EOB while pulling to sit; Tending to lean posteriorly once sitting; heavy mod assist to reciprocally scoot hips to EOB and cues to lean forward and press feet into the floor  Transfers Overall transfer level: Needs assistance Equipment used: Rolling walker (2 wheeled) Transfers: Sit to/from Stand Sit to Stand: Mod assist;+2 physical assistance         General transfer comment: Moderate assist of two to power up; cues for hand placement and safety; noted dependent on momentum to stand up  Ambulation/Gait Ambulation/Gait assistance: +2 safety/equipment;Mod assist Ambulation Distance (Feet): 6  Feet Assistive device: Rolling walker (2 wheeled) Gait Pattern/deviations: Decreased step length - right;Decreased step length - left;Decreased stance time - right;Decreased stance time - left;Shuffle;Wide base of support     General Gait Details: Max encouragement to take steps; difficulty weight shifting entirely onto stance leg for contralateral leg advancement, leading to shuffle steps; Near constant cues to keep eyes open; gentle facilitation of forward direction with steps   Stairs            Wheelchair Mobility    Modified Rankin (Stroke Patients Only)       Balance     Sitting balance-Leahy Scale: Fair       Standing balance-Leahy Scale: Poor                      Cognition Arousal/Alertness: Awake/alert Behavior During Therapy: WFL for tasks assessed/performed Overall Cognitive Status: Within Functional Limits for tasks assessed                 General Comments: needign loads of encouragement to get up and OOB    Exercises      General Comments        Pertinent Vitals/Pain Pain Assessment: 0-10 Pain Score: 8  Pain Location: lower abdomen Pain Descriptors / Indicators: Aching;Discomfort Pain Intervention(s): Monitored during session;Repositioned    Home Living                      Prior Function  PT Goals (current goals can now be found in the care plan section) Acute Rehab PT Goals Patient Stated Goal: get better PT Goal Formulation: With patient Time For Goal Achievement: 10/04/15 Potential to Achieve Goals: Fair Progress towards PT goals: Progressing toward goals (with lots of encouragement)    Frequency  Min 3X/week    PT Plan Current plan remains appropriate    Co-evaluation             End of Session Equipment Utilized During Treatment: Gait belt Activity Tolerance: Patient tolerated treatment well Patient left: in chair;with call bell/phone within reach;with chair alarm set     Time:  6578-4696 PT Time Calculation (min) (ACUTE ONLY): 19 min  Charges:  $Gait Training: 8-22 mins                    G Codes:      Olen Pel 09/22/2015, 11:14 AM   Van Clines, PT  Acute Rehabilitation Services Pager [phone] Office [phone]

## 2015-09-22 NOTE — Care Management Important Message (Signed)
Important Message  Patient Details  Name: [name] MRN: [id number] Date of Birth: [birth year]   Medicare Important Message Given:  Yes    Bernadine Melecio Stefan Church 09/22/2015, 12:57 PM

## 2015-09-22 NOTE — Progress Notes (Signed)
[facility]  PROGRESS NOTE  Caroline [name] ZOX:[id number] DOB: 1945/12/11 DOA: 09/16/2015 PCP: Rich Number, MD    Brief HPI:   70 yo AAF with 3 month hx of right real calculi with on going flank pain. 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 She presents 9/6 with 72hours of increasing rt flank pain, vomiting, diarrhea, and with fever 102 rectal. CT scan abd reveals rt hydronephrosis and obstructing stone rt. She has had 4 litre of IVF and has sbp 109. PCCM admitted the patient to ICU,off neo, post right renal stone removal.  Hospitalist service resumed care on 09/18/2015    Assessment/Plan:    1. Sepsis- improving, from right renal stone, hydronephrosis. Blood cultures growing Klebsiella pneumoniae, will  cefepime has been switched to ceftriaxone. Vancomycin has been discontinued.Patient started on Ceftin 500 mg by mouth twice a day. 2. Right renal stone/hydronephrosis- status post cystoscopy, right retrograde ureterogram and insertion of right ureteral stent. Urology has signed off and follow patient in 2 weeks 3. Combined systolic and diastolic heart failure- echocardiogram showed EF 45-50% with mildly reduced systolic function and grade 2 diastolic dysfunction. Continue with  Lasix 20 mg IV every 12 hours 4. History of pulmonary embolism-  patient was on Coumadin, she was started on heparin protocol. INR is 2.06, heparin has been discontinued. 5. Hypokalemia- replace potassium and check BMP in a.m.    DVT prophylaxis: Heparin Code Status: Full code Family Communication: Discussed with patient's daughter and son at bedside on 09/19/15. Disposition Plan: Skilled nursing facility   Consultants:  [facility]  Urology  Procedures: SIGNIFICANT EVENTS: 9/6 admit to ICU. 9/6 urology consult taken to OR for stone removal  9/7- remained in shock on neo  Antibiotics: 9/6 vanc>>>9/7 9/6 cefepime>> 9/8 Ceftriaxone-9/8 >> 9/11 Ceftin-  9/11>>  Subjective   Patient seen and examined. Patient has been on 2 L of oxygen via nasal cannula. She is not on home oxygen. Chest x-ray on 09/16/2015 showed congestive heart failure/interstitial edema. Started on IV Lasix with good diuresis -3940  Objective    Objective: Vitals:   09/21/15 1954 09/22/15 0550 09/22/15 0600 09/22/15 0900  BP: (!) 143/63 (!) 146/63  (!) 146/72  Pulse: 79 88  87  Resp: 17 17  18   Temp: 99 F (37.2 C) 99.5 F (37.5 C)  98.8 F (37.1 C)  TempSrc: Oral Oral  Oral  SpO2: 100% 99%  97%  Weight: (!) 138.1 kg (304 lb 7.3 oz)  131.3 kg (289 lb 8 oz)   Height:        Intake/Output Summary (Last 24 hours) at 09/22/15 1531 Last data filed at 09/22/15 1300  Gross per 24 hour  Intake             1360 ml  Output             4300 ml  Net            -2940 ml   Filed Weights   09/21/15 0500 09/21/15 1954 09/22/15 0600  Weight: (!) 137.8 kg (303 lb 12.7 oz) (!) 138.1 kg (304 lb 7.3 oz) 131.3 kg (289 lb 8 oz)    Examination:  General exam: Appears calm and comfortable  Respiratory system: Bilateral crackles Respiratory effort normal. Cardiovascular system: S1 & S2 heard, RRR. No JVD, murmurs, rubs, gallops or clicks. No pedal edema. Gastrointestinal system: Abdomen is nondistended, soft and + right flank tenderness. No organomegaly or masses  felt. Normal bowel sounds heard. Central nervous system: Alert and oriented. No focal neurological deficits. Extremities: Symmetric 5 x 5 power. Skin: No rashes, lesions or ulcers Psychiatry: Judgement and insight appear normal. Mood & affect appropriate.    Data Reviewed: I have personally reviewed following labs and imaging studies Basic Metabolic Panel:  Recent Labs Lab 09/17/15 0205 09/18/15 0246 09/19/15 0321 09/21/15 1913 09/22/15 0431  NA 140 141 144 143 144  K 3.9 3.4* 3.7 2.8* 3.0*  CL 111 111 115* 106 106  CO2 21* 22 25 30 30   GLUCOSE 176* 150* 134* 109* 100*  BUN 19 17 8  5* 6  CREATININE  1.24* 0.98 0.75 0.86 0.87  CALCIUM 7.2* 7.3* 7.8* 8.4* 8.4*   Liver Function Tests:  Recent Labs Lab 09/16/15 0538  AST 27  ALT 25  ALKPHOS 75  BILITOT 0.6  PROT 7.5  ALBUMIN 3.7    Recent Labs Lab 09/16/15 0538  LIPASE 36   No results for input(s): AMMONIA in the last 168 hours. CBC:  Recent Labs Lab 09/18/15 0246 09/19/15 0321 09/20/15 0541 09/21/15 0417 09/22/15 0431  WBC 15.6* 14.6* 11.6* 9.8 7.9  HGB 9.9* 9.7* 9.3* 9.5* 9.5*  HCT 32.4* 31.4* 30.4* 31.6* 31.5*  MCV 80.6 80.3 79.4 81.0 80.2  PLT 104* 101* 130* 133* 173   Cardiac Enzymes:  Recent Labs Lab 09/16/15 1435  TROPONINI 0.10*    CBG:  Recent Labs Lab 09/16/15 1348 09/16/15 1549  GLUCAP 177* 163*    Recent Results (from the past 240 hour(s))  Urine culture     Status: Abnormal   Collection Time: 09/16/15  5:53 AM  Result Value Ref Range Status   Specimen Description URINE, RANDOM  Final   Special Requests NONE  Final   Culture MULTIPLE SPECIES PRESENT, SUGGEST RECOLLECTION (A)  Final   Report Status 09/17/2015 FINAL  Final  Blood Culture (routine x 2)     Status: Abnormal   Collection Time: 09/16/15  8:15 AM  Result Value Ref Range Status   Specimen Description BLOOD LEFT ANTECUBITAL  Final   Special Requests BOTTLES DRAWN AEROBIC AND ANAEROBIC 5CC  Final   Culture  Setup Time   Final    GRAM NEGATIVE RODS IN BOTH AEROBIC AND ANAEROBIC BOTTLES CRITICAL RESULT CALLED TO, READ BACK BY AND VERIFIED WITH: T. EGAN PHARMD, AT 4098 09/16/15 A. BROWNING    Culture KLEBSIELLA PNEUMONIAE (A)  Final   Report Status 09/18/2015 FINAL  Final   Organism ID, Bacteria KLEBSIELLA PNEUMONIAE  Final      Susceptibility   Klebsiella pneumoniae - MIC*    AMPICILLIN 16 RESISTANT Resistant     CEFAZOLIN <=4 SENSITIVE Sensitive     CEFEPIME <=1 SENSITIVE Sensitive     CEFTAZIDIME <=1 SENSITIVE Sensitive     CEFTRIAXONE <=1 SENSITIVE Sensitive     CIPROFLOXACIN <=0.25 SENSITIVE Sensitive      GENTAMICIN <=1 SENSITIVE Sensitive     IMIPENEM <=0.25 SENSITIVE Sensitive     TRIMETH/SULFA <=20 SENSITIVE Sensitive     AMPICILLIN/SULBACTAM 4 SENSITIVE Sensitive     PIP/TAZO <=4 SENSITIVE Sensitive     Extended ESBL NEGATIVE Sensitive     * KLEBSIELLA PNEUMONIAE  Blood Culture (routine x 2)     Status: Abnormal   Collection Time: 09/16/15  8:30 AM  Result Value Ref Range Status   Specimen Description BLOOD LEFT HAND  Final   Special Requests BOTTLES DRAWN AEROBIC AND ANAEROBIC 5CC  Final   Culture  Setup Time   Final    GRAM NEGATIVE RODS ANAEROBIC BOTTLE ONLY CRITICAL RESULT CALLED TO, READ BACK BY AND VERIFIED WITH: Hilarie Fredrickson EGAN PHARMD 1934 09/16/15 A BROWNING    Culture (A)  Final    KLEBSIELLA PNEUMONIAE SUSCEPTIBILITIES PERFORMED ON PREVIOUS CULTURE WITHIN THE LAST 5 DAYS.    Report Status 09/18/2015 FINAL  Final  Blood Culture ID Panel (Reflexed)     Status: Abnormal   Collection Time: 09/16/15  8:30 AM  Result Value Ref Range Status   Enterococcus species NOT DETECTED NOT DETECTED Final   Vancomycin resistance NOT DETECTED NOT DETECTED Final   Listeria monocytogenes NOT DETECTED NOT DETECTED Final   Staphylococcus species NOT DETECTED NOT DETECTED Final   Staphylococcus aureus NOT DETECTED NOT DETECTED Final   Methicillin resistance NOT DETECTED NOT DETECTED Final   Streptococcus species NOT DETECTED NOT DETECTED Final   Streptococcus agalactiae NOT DETECTED NOT DETECTED Final   Streptococcus pneumoniae NOT DETECTED NOT DETECTED Final   Streptococcus pyogenes NOT DETECTED NOT DETECTED Final   Acinetobacter baumannii NOT DETECTED NOT DETECTED Final   Enterobacteriaceae species DETECTED (A) NOT DETECTED Final    Comment: CRITICAL RESULT CALLED TO, READ BACK BY AND VERIFIED WITH: T EGAN PHARMD 1934 09/16/15 A BROWNING    Enterobacter cloacae complex NOT DETECTED NOT DETECTED Final   Escherichia coli NOT DETECTED NOT DETECTED Final   Klebsiella oxytoca NOT DETECTED NOT DETECTED  Final   Klebsiella pneumoniae DETECTED (A) NOT DETECTED Final    Comment: CRITICAL RESULT CALLED TO, READ BACK BY AND VERIFIED WITH: Hilarie Fredrickson EGAN PHARMD 1934 09/16/15 A BROWNING    Proteus species NOT DETECTED NOT DETECTED Final   Serratia marcescens NOT DETECTED NOT DETECTED Final   Carbapenem resistance NOT DETECTED NOT DETECTED Final   Haemophilus influenzae NOT DETECTED NOT DETECTED Final   Neisseria meningitidis NOT DETECTED NOT DETECTED Final   Pseudomonas aeruginosa NOT DETECTED NOT DETECTED Final   Candida albicans NOT DETECTED NOT DETECTED Final   Candida glabrata NOT DETECTED NOT DETECTED Final   Candida krusei NOT DETECTED NOT DETECTED Final   Candida parapsilosis NOT DETECTED NOT DETECTED Final   Candida tropicalis NOT DETECTED NOT DETECTED Final  MRSA PCR Screening     Status: None   Collection Time: 09/16/15  4:50 PM  Result Value Ref Range Status   MRSA by PCR NEGATIVE NEGATIVE Final    Comment:        The GeneXpert MRSA Assay (FDA approved for NASAL specimens only), is one component of a comprehensive MRSA colonization surveillance program. It is not intended to diagnose MRSA infection nor to guide or monitor treatment for MRSA infections.      Studies: No results found.  Scheduled Meds: . cefUROXime  500 mg Oral BID WC  . dextromethorphan-guaiFENesin  1 tablet Oral BID  . furosemide  20 mg Intravenous Q12H  . ondansetron (ZOFRAN) IV  4 mg Intravenous Q6H  . pantoprazole  40 mg Oral Daily  . potassium chloride  40 mEq Oral Q4H  . warfarin  2 mg Oral Once per day on Sun Tue Thu Sat  . [START ON 09/23/2015] warfarin  4 mg Oral Once per day on Mon Wed Fri  . Warfarin - Pharmacist Dosing Inpatient   Does not apply q1800   Continuous Infusions: . lactated ringers Stopped (09/16/15 1600)       Time spent: 25 min    [facility],Aaryan Essman S  [facility] Pager [phone]. If 7PM-7AM, please contact night-coverage  at [email or web address], Office  [phone]  password  TRH1 09/22/2015, 3:31 PM  LOS: 6 days

## 2015-09-22 NOTE — Discharge Instructions (Signed)

## 2015-09-23 DIAGNOSIS — B961 Klebsiella pneumoniae [K. pneumoniae] as the cause of diseases classified elsewhere: Secondary | ICD-10-CM | POA: Diagnosis not present

## 2015-09-23 DIAGNOSIS — R0600 Dyspnea, unspecified: Secondary | ICD-10-CM | POA: Diagnosis not present

## 2015-09-23 DIAGNOSIS — Z48816 Encounter for surgical aftercare following surgery on the genitourinary system: Secondary | ICD-10-CM | POA: Diagnosis not present

## 2015-09-23 DIAGNOSIS — R531 Weakness: Secondary | ICD-10-CM | POA: Diagnosis not present

## 2015-09-23 DIAGNOSIS — N201 Calculus of ureter: Secondary | ICD-10-CM | POA: Diagnosis not present

## 2015-09-23 DIAGNOSIS — E782 Mixed hyperlipidemia: Secondary | ICD-10-CM | POA: Diagnosis not present

## 2015-09-23 DIAGNOSIS — I5022 Chronic systolic (congestive) heart failure: Secondary | ICD-10-CM | POA: Diagnosis not present

## 2015-09-23 DIAGNOSIS — R112 Nausea with vomiting, unspecified: Secondary | ICD-10-CM | POA: Diagnosis not present

## 2015-09-23 DIAGNOSIS — I10 Essential (primary) hypertension: Secondary | ICD-10-CM | POA: Diagnosis not present

## 2015-09-23 DIAGNOSIS — R2681 Unsteadiness on feet: Secondary | ICD-10-CM | POA: Diagnosis not present

## 2015-09-23 DIAGNOSIS — R5381 Other malaise: Secondary | ICD-10-CM | POA: Diagnosis not present

## 2015-09-23 DIAGNOSIS — R197 Diarrhea, unspecified: Secondary | ICD-10-CM

## 2015-09-23 DIAGNOSIS — R1311 Dysphagia, oral phase: Secondary | ICD-10-CM | POA: Diagnosis not present

## 2015-09-23 DIAGNOSIS — M6281 Muscle weakness (generalized): Secondary | ICD-10-CM | POA: Diagnosis not present

## 2015-09-23 DIAGNOSIS — G894 Chronic pain syndrome: Secondary | ICD-10-CM | POA: Diagnosis not present

## 2015-09-23 DIAGNOSIS — E876 Hypokalemia: Secondary | ICD-10-CM | POA: Diagnosis not present

## 2015-09-23 DIAGNOSIS — J4540 Moderate persistent asthma, uncomplicated: Secondary | ICD-10-CM | POA: Diagnosis not present

## 2015-09-23 DIAGNOSIS — R278 Other lack of coordination: Secondary | ICD-10-CM | POA: Diagnosis not present

## 2015-09-23 DIAGNOSIS — A419 Sepsis, unspecified organism: Secondary | ICD-10-CM | POA: Diagnosis not present

## 2015-09-23 LAB — CBC
HEMATOCRIT: 33.4 % — AB (ref 36.0–46.0)
HEMOGLOBIN: 10.2 g/dL — AB (ref 12.0–15.0)
MCH: 24.6 pg — ABNORMAL LOW (ref 26.0–34.0)
MCHC: 30.5 g/dL (ref 30.0–36.0)
MCV: 80.7 fL (ref 78.0–100.0)
Platelets: 186 10*3/uL (ref 150–400)
RBC: 4.14 MIL/uL (ref 3.87–5.11)
RDW: 15.4 % (ref 11.5–15.5)
WBC: 8.9 10*3/uL (ref 4.0–10.5)

## 2015-09-23 LAB — PROTIME-INR
INR: 2.14
Prothrombin Time: 24.3 seconds — ABNORMAL HIGH (ref 11.4–15.2)

## 2015-09-23 MED ORDER — INFLUENZA VAC SPLIT QUAD 0.5 ML IM SUSY: 0.5000 mL | PREFILLED_SYRINGE | INTRAMUSCULAR | Status: DC | PRN

## 2015-09-23 MED ORDER — PANTOPRAZOLE SODIUM 40 MG PO TBEC: 40.0000 mg | DELAYED_RELEASE_TABLET | Freq: Every day | ORAL | 0 refills | Status: DC

## 2015-09-23 MED ORDER — POTASSIUM CHLORIDE CRYS ER 20 MEQ PO TBCR
60.0000 meq | EXTENDED_RELEASE_TABLET | Freq: Once | ORAL | Status: AC
  Administered 2015-09-23: 60 meq via ORAL
  Filled 2015-09-23: qty 3

## 2015-09-23 MED ORDER — CEFUROXIME AXETIL 500 MG PO TABS: 500.0000 mg | ORAL_TABLET | Freq: Two times a day (BID) | ORAL | 0 refills | Status: AC

## 2015-09-23 MED ORDER — POTASSIUM CHLORIDE CRYS ER 20 MEQ PO TBCR: 40.0000 meq | EXTENDED_RELEASE_TABLET | Freq: Every day | ORAL | 0 refills | Status: DC

## 2015-09-23 MED ORDER — FUROSEMIDE 40 MG PO TABS: 40.0000 mg | ORAL_TABLET | ORAL | 0 refills | Status: DC

## 2015-09-23 NOTE — Progress Notes (Signed)
Report called to stephanie RN at [facility].  Place stable at transfer and without complaints.  All belongings with pt.  Pt's daughter updated and notified of transfer.

## 2015-09-23 NOTE — Discharge Summary (Signed)
Physician Discharge Summary  Caroline [name] MRN: [id number] DOB/AGE: 06/12/1945 70 y.o.  PCP: Albin Felling, MD   Admit date: 09/16/2015 Discharge date: 09/23/2015  Discharge Diagnoses:    Active Problems:   Sepsis ([place])   Surgery, elective   Right ureteral stone   Congestive heart failure (HCC)   Nausea vomiting and diarrhea    Follow-up recommendations Follow-up with PCP in 3-5 days , including all  additional recommended appointments as below Follow-up CBC, CMP in 3-5 days Patient to follow-up with urology,Scott MacDiarmid, MD Patient would benefit from outpatient cardiology evaluation for  EF of 45-50%     Current Discharge Medication List    START taking these medications   Details  cefUROXime (CEFTIN) 500 MG tablet Take 1 tablet (500 mg total) by mouth 2 (two) times daily with a meal. Qty: 18 tablet, Refills: 0    furosemide (LASIX) 40 MG tablet Take 1 tablet (40 mg total) by mouth every other day. Qty: 30 tablet, Refills: 0    pantoprazole (PROTONIX) 40 MG tablet Take 1 tablet (40 mg total) by mouth daily. Qty: 30 tablet, Refills: 0    potassium chloride SA (K-DUR,KLOR-CON) 20 MEQ tablet Take 2 tablets (40 mEq total) by mouth daily. Qty: 2 tablet, Refills: 0      CONTINUE these medications which have NOT CHANGED   Details  acetaminophen (TYLENOL) 500 MG tablet Take 500 mg by mouth every 6 (six) hours as needed for mild pain or moderate pain. pain    calcium citrate-vitamin D (CITRACAL+D) 315-200 MG-UNIT tablet Take 2 tablets by mouth daily. Qty: 100 tablet, Refills: 3   Associated Diagnoses: Vertigo    hydrocortisone (PROCTOSOL HC) 2.5 % rectal cream PLACE 1 APPLICATION RECTALLY 2 (TWO) TIMES DAILY. Qty: 28.35 g, Refills: 5    meclizine (ANTIVERT) 25 MG tablet TAKE 1 TABLET (25 MG TOTAL) BY MOUTH 3 (THREE) TIMES DAILY AS NEEDED. Qty: 90 tablet, Refills: 1   Associated Diagnoses: Vertigo    metoprolol tartrate (LOPRESSOR) 25 MG tablet TAKE 1 TABLET (25 MG  TOTAL) BY MOUTH 2 (TWO) TIMES DAILY. Qty: 120 tablet, Refills: 3    montelukast (SINGULAIR) 10 MG tablet Take 1 tablet (10 mg total) by mouth at bedtime. Qty: 30 tablet, Refills: 11   Associated Diagnoses: Asthma, chronic, mild persistent, uncomplicated    omeprazole (PRILOSEC) 20 MG capsule TAKE 1 CAPSULE (20 MG TOTAL) BY MOUTH DAILY. Qty: 30 capsule, Refills: 2    pravastatin (PRAVACHOL) 80 MG tablet TAKE 1 TABLET (80 MG TOTAL) BY MOUTH DAILY. Qty: 30 tablet, Refills: 11   Associated Diagnoses: DVT, lower extremity, recurrent, unspecified laterality (HCC)    PROAIR HFA 108 (90 BASE) MCG/ACT inhaler INHALE 2 PUFFS INTO THE LUNGS EVERY 6 (SIX) HOURS AS NEEDED FOR WHEEZING OR SHORTNESS OF BREATH. Qty: 8.5 Inhaler, Refills: 5    SYMBICORT 80-4.5 MCG/ACT inhaler USE 2 SPRAYS TWICE DAILY Qty: 10.2 Inhaler, Refills: 5    warfarin (COUMADIN) 4 MG tablet Take 1/2 tablet on Sundays/Thursdays/Saturdays; take 1 tablet all other days. Qty: 24 tablet, Refills: 5   Associated Diagnoses: DVT, lower extremity, recurrent, unspecified laterality ([place])      STOP taking these medications     hydrochlorothiazide (HYDRODIURIL) 25 MG tablet          Discharge Condition:  Discharge Instructions Get Medicines reviewed and adjusted: Please take all your medications with you for your next visit with your Primary MD  Please request your Primary MD to go over all hospital  tests and procedure/radiological results at the follow up, please ask your Primary MD to get all Hospital records sent to his/her office.  If you experience worsening of your admission symptoms, develop shortness of breath, life threatening emergency, suicidal or homicidal thoughts you must seek medical attention immediately by calling 911 or calling your MD immediately if symptoms less severe.  You must read complete instructions/literature along with all the possible adverse reactions/side effects for all the Medicines you take and  that have been prescribed to you. Take any new Medicines after you have completely understood and accpet all the possible adverse reactions/side effects.   Do not drive when taking Pain medications.   Do not take more than prescribed Pain, Sleep and Anxiety Medications  Special Instructions: If you have smoked or chewed Tobacco in the last 2 yrs please stop smoking, stop any regular Alcohol and or any Recreational drug use.  Wear Seat belts while driving.  Please note  You were cared for by a hospitalist during your hospital stay. Once you are discharged, your primary care physician will handle any further medical issues. Please note that NO REFILLS for any discharge medications will be authorized once you are discharged, as it is imperative that you return to your primary care physician (or establish a relationship with a primary care physician if you do not have one) for your aftercare needs so that they can reassess your need for medications and monitor your lab values.     Allergies  Allergen Reactions  . Penicillins Swelling  . Shellfish Allergy Swelling  . Tomato Swelling      Disposition: 01-Home or Self Care   Consults:  Urology Critical care      Significant Diagnostic Studies:  Ct Abdomen Pelvis W Contrast  Result Date: 09/16/2015 CLINICAL DATA:  Right lower quadrant abdominal pain. Nausea vomiting diarrhea EXAM: CT ABDOMEN AND PELVIS WITH CONTRAST TECHNIQUE: Multidetector CT imaging of the abdomen and pelvis was performed using the standard protocol following bolus administration of intravenous contrast. CONTRAST:  150m ISOVUE-300 IOPAMIDOL (ISOVUE-300) INJECTION 61% COMPARISON:  None. FINDINGS: Lower chest: Mild bibasilar atelectasis. Cardiac enlargement. No pleural effusion. Hepatobiliary: No focal liver lesion. Liver density within normal limits. Gallbladder and bile ducts normal. Pancreas: Negative Spleen: Negative Adrenals/Urinary Tract: Right-sided  hydronephrosis and hydroureter. Ureter is dilated down to a stone at the iliac crossing. Obstructing stone measures 4 x 8 mm. Additional calculi in the right renal calices. The largest stone in the right lower pole measures 8 mm. There is perinephric edema. There is a 5 mm calcification in the retroperitoneum on the right near the proximal ureter which appears to be a calcified lymph node and appears outside of the ureter. No left renal calculi or hydronephrosis. Left upper pole 15 mm cyst. Urinary bladder normal. Stomach/Bowel: Stomach and duodenum normal. Negative for small bowel obstruction. No bowel edema. Sigmoid diverticulosis without changes of diverticulitis. Vascular/Lymphatic: Mild atherosclerotic calcification in the aorta and iliac arteries. No aneurysm. No lymphadenopathy Reproductive: Hysterectomy.  No pelvic mass. Other: No free fluid. Negative for hernia. Image quality degraded by motion. Musculoskeletal: Lumbar disc and facet degeneration L4-5 and L5-S1. Grade 1 anterior slip L4-5. No acute skeletal abnormality. IMPRESSION: Moderate right hydronephrosis. 4 x 8 mm obstructing stone distal right ureter at the level of the iliac crossing. Additional nonobstructing stones in the right kidney. Note is made of a 5 mm calcification near the right ureter proximally which is felt to be outside of the renal collecting system. Electronically  Signed   By: Franchot Gallo M.D.   On: 09/16/2015 09:29   Dg Cystogram  Result Date: 09/16/2015 CLINICAL DATA:  70 year old female undergoing right-sided ureteral stent placement EXAM: CYSTOGRAM - 3+ VIEW CONTRAST:  Please see operative report for detail FLUOROSCOPY TIME:  Fluoroscopy Time:  1 minutes 27 seconds Number of Acquired Spot Images: 0 COMPARISON:  CT abdomen/ pelvis 09/16/2015 FINDINGS: 2 intraoperative spot images demonstrate placement of a right-sided double-J ureteral stent. The proximal loop is appropriately reconstituted and overlies and upper pole  calyx. The lower loop is partially reconstituted and overlies the bladder. Partial contrast opacification of the right renal collecting system demonstrates mild hydronephrosis. IMPRESSION: 1. Mild right hydronephrosis. 2. Right-sided double-J ureteral stent. Electronically Signed   By: Jacqulynn Cadet M.D.   On: 09/16/2015 14:45   Dg Chest Port 1 View  Result Date: 09/16/2015 CLINICAL DATA:  Nausea and diarrhea. EXAM: PORTABLE CHEST 1 VIEW COMPARISON:  03/20/2015. FINDINGS: Cardiomegaly with bilateral pulmonary interstitial prominence consistent congestive heart failure. Pneumonitis cannot be excluded. Low lung volumes with bibasilar atelectasis . No prominent pleural effusion or pneumothorax . IMPRESSION: 1. Cardiomegaly with bilateral from interstitial prominence consistent with congestive heart failure. Pneumonitis cannot be excluded. 2. Low lung volumes with bibasilar atelectasis. Electronically Signed   By: Marcello Moores  Register   On: 09/16/2015 09:39    2-D echo  Left ventricle: The cavity size was normal. Systolic function was   mildly reduced. The estimated ejection fraction was in the range   of 45% to 50%. Wall motion was normal; there were no regional   wall motion abnormalities. Features are consistent with a   pseudonormal left ventricular filling pattern, with concomitant   abnormal relaxation and increased filling pressure (grade 2   diastolic dysfunction). - Left atrium: The atrium was moderately dilated.  Impressions:  - There was no evidence of a vegetation.  Filed Weights   09/21/15 1954 09/22/15 0600 09/22/15 2045  Weight: (!) 138.1 kg (304 lb 7.3 oz) 131.3 kg (289 lb 8 oz) 131.1 kg (289 lb 0.4 oz)     Microbiology: Recent Results (from the past 240 hour(s))  Urine culture     Status: Abnormal   Collection Time: 09/16/15  5:53 AM  Result Value Ref Range Status   Specimen Description URINE, RANDOM  Final   Special Requests NONE  Final   Culture MULTIPLE SPECIES  PRESENT, SUGGEST RECOLLECTION (A)  Final   Report Status 09/17/2015 FINAL  Final  Blood Culture (routine x 2)     Status: Abnormal   Collection Time: 09/16/15  8:15 AM  Result Value Ref Range Status   Specimen Description BLOOD LEFT ANTECUBITAL  Final   Special Requests BOTTLES DRAWN AEROBIC AND ANAEROBIC 5CC  Final   Culture  Setup Time   Final    GRAM NEGATIVE RODS IN BOTH AEROBIC AND ANAEROBIC BOTTLES CRITICAL RESULT CALLED TO, READ BACK BY AND VERIFIED WITH: T. EGAN PHARMD, AT 9509 09/16/15 A. BROWNING    Culture KLEBSIELLA PNEUMONIAE (A)  Final   Report Status 09/18/2015 FINAL  Final   Organism ID, Bacteria KLEBSIELLA PNEUMONIAE  Final      Susceptibility   Klebsiella pneumoniae - MIC*    AMPICILLIN 16 RESISTANT Resistant     CEFAZOLIN <=4 SENSITIVE Sensitive     CEFEPIME <=1 SENSITIVE Sensitive     CEFTAZIDIME <=1 SENSITIVE Sensitive     CEFTRIAXONE <=1 SENSITIVE Sensitive     CIPROFLOXACIN <=0.25 SENSITIVE Sensitive  GENTAMICIN <=1 SENSITIVE Sensitive     IMIPENEM <=0.25 SENSITIVE Sensitive     TRIMETH/SULFA <=20 SENSITIVE Sensitive     AMPICILLIN/SULBACTAM 4 SENSITIVE Sensitive     PIP/TAZO <=4 SENSITIVE Sensitive     Extended ESBL NEGATIVE Sensitive     * KLEBSIELLA PNEUMONIAE  Blood Culture (routine x 2)     Status: Abnormal   Collection Time: 09/16/15  8:30 AM  Result Value Ref Range Status   Specimen Description BLOOD LEFT HAND  Final   Special Requests BOTTLES DRAWN AEROBIC AND ANAEROBIC 5CC  Final   Culture  Setup Time   Final    GRAM NEGATIVE RODS ANAEROBIC BOTTLE ONLY CRITICAL RESULT CALLED TO, READ BACK BY AND VERIFIED WITH: Consuello Masse PHARMD 1934 09/16/15 A BROWNING    Culture (A)  Final    KLEBSIELLA PNEUMONIAE SUSCEPTIBILITIES PERFORMED ON PREVIOUS CULTURE WITHIN THE LAST 5 DAYS.    Report Status 09/18/2015 FINAL  Final  Blood Culture ID Panel (Reflexed)     Status: Abnormal   Collection Time: 09/16/15  8:30 AM  Result Value Ref Range Status    Enterococcus species NOT DETECTED NOT DETECTED Final   Vancomycin resistance NOT DETECTED NOT DETECTED Final   Listeria monocytogenes NOT DETECTED NOT DETECTED Final   Staphylococcus species NOT DETECTED NOT DETECTED Final   Staphylococcus aureus NOT DETECTED NOT DETECTED Final   Methicillin resistance NOT DETECTED NOT DETECTED Final   Streptococcus species NOT DETECTED NOT DETECTED Final   Streptococcus agalactiae NOT DETECTED NOT DETECTED Final   Streptococcus pneumoniae NOT DETECTED NOT DETECTED Final   Streptococcus pyogenes NOT DETECTED NOT DETECTED Final   Acinetobacter baumannii NOT DETECTED NOT DETECTED Final   Enterobacteriaceae species DETECTED (A) NOT DETECTED Final    Comment: CRITICAL RESULT CALLED TO, READ BACK BY AND VERIFIED WITH: T EGAN PHARMD 1934 09/16/15 A BROWNING    Enterobacter cloacae complex NOT DETECTED NOT DETECTED Final   Escherichia coli NOT DETECTED NOT DETECTED Final   Klebsiella oxytoca NOT DETECTED NOT DETECTED Final   Klebsiella pneumoniae DETECTED (A) NOT DETECTED Final    Comment: CRITICAL RESULT CALLED TO, READ BACK BY AND VERIFIED WITH: Consuello Masse PHARMD 1934 09/16/15 A BROWNING    Proteus species NOT DETECTED NOT DETECTED Final   Serratia marcescens NOT DETECTED NOT DETECTED Final   Carbapenem resistance NOT DETECTED NOT DETECTED Final   Haemophilus influenzae NOT DETECTED NOT DETECTED Final   Neisseria meningitidis NOT DETECTED NOT DETECTED Final   Pseudomonas aeruginosa NOT DETECTED NOT DETECTED Final   Candida albicans NOT DETECTED NOT DETECTED Final   Candida glabrata NOT DETECTED NOT DETECTED Final   Candida krusei NOT DETECTED NOT DETECTED Final   Candida parapsilosis NOT DETECTED NOT DETECTED Final   Candida tropicalis NOT DETECTED NOT DETECTED Final  MRSA PCR Screening     Status: None   Collection Time: 09/16/15  4:50 PM  Result Value Ref Range Status   MRSA by PCR NEGATIVE NEGATIVE Final    Comment:        The GeneXpert MRSA Assay  (FDA approved for NASAL specimens only), is one component of a comprehensive MRSA colonization surveillance program. It is not intended to diagnose MRSA infection nor to guide or monitor treatment for MRSA infections.        Blood Culture    Component Value Date/Time   SDES BLOOD LEFT HAND 09/16/2015 0830   SPECREQUEST BOTTLES DRAWN AEROBIC AND ANAEROBIC 5CC 09/16/2015 0830   CULT (A) 09/16/2015 0830  KLEBSIELLA PNEUMONIAE SUSCEPTIBILITIES PERFORMED ON PREVIOUS CULTURE WITHIN THE LAST 5 DAYS.    REPTSTATUS 09/18/2015 FINAL 09/16/2015 0830      Labs: Results for orders placed or performed during the hospital encounter of 09/16/15 (from the past 48 hour(s))  Brain natriuretic peptide     Status: Abnormal   Collection Time: 09/21/15  3:28 PM  Result Value Ref Range   B Natriuretic Peptide 251.8 (H) 0.0 - 100.0 pg/mL  Basic metabolic panel     Status: Abnormal   Collection Time: 09/21/15  7:13 PM  Result Value Ref Range   Sodium 143 135 - 145 mmol/L   Potassium 2.8 (L) 3.5 - 5.1 mmol/L   Chloride 106 101 - 111 mmol/L   CO2 30 22 - 32 mmol/L   Glucose, Bld 109 (H) 65 - 99 mg/dL   BUN 5 (L) 6 - 20 mg/dL   Creatinine, Ser 0.86 0.44 - 1.00 mg/dL   Calcium 8.4 (L) 8.9 - 10.3 mg/dL   GFR calc non Af Amer >60 >60 mL/min   GFR calc Af Amer >60 >60 mL/min    Comment: (NOTE) The eGFR has been calculated using the CKD EPI equation. This calculation has not been validated in all clinical situations. eGFR's persistently <60 mL/min signify possible Chronic Kidney Disease.    Anion gap 7 5 - 15  CBC     Status: Abnormal   Collection Time: 09/22/15  4:31 AM  Result Value Ref Range   WBC 7.9 4.0 - 10.5 K/uL   RBC 3.93 3.87 - 5.11 MIL/uL   Hemoglobin 9.5 (L) 12.0 - 15.0 g/dL   HCT 31.5 (L) 36.0 - 46.0 %   MCV 80.2 78.0 - 100.0 fL   MCH 24.2 (L) 26.0 - 34.0 pg   MCHC 30.2 30.0 - 36.0 g/dL   RDW 15.7 (H) 11.5 - 15.5 %   Platelets 173 150 - 400 K/uL  Protime-INR     Status:  Abnormal   Collection Time: 09/22/15  4:31 AM  Result Value Ref Range   Prothrombin Time 23.6 (H) 11.4 - 15.2 seconds   INR 9.93   Basic metabolic panel     Status: Abnormal   Collection Time: 09/22/15  4:31 AM  Result Value Ref Range   Sodium 144 135 - 145 mmol/L   Potassium 3.0 (L) 3.5 - 5.1 mmol/L   Chloride 106 101 - 111 mmol/L   CO2 30 22 - 32 mmol/L   Glucose, Bld 100 (H) 65 - 99 mg/dL   BUN 6 6 - 20 mg/dL   Creatinine, Ser 0.87 0.44 - 1.00 mg/dL   Calcium 8.4 (L) 8.9 - 10.3 mg/dL   GFR calc non Af Amer >60 >60 mL/min   GFR calc Af Amer >60 >60 mL/min    Comment: (NOTE) The eGFR has been calculated using the CKD EPI equation. This calculation has not been validated in all clinical situations. eGFR's persistently <60 mL/min signify possible Chronic Kidney Disease.    Anion gap 8 5 - 15  CBC     Status: Abnormal   Collection Time: 09/23/15  5:30 AM  Result Value Ref Range   WBC 8.9 4.0 - 10.5 K/uL   RBC 4.14 3.87 - 5.11 MIL/uL   Hemoglobin 10.2 (L) 12.0 - 15.0 g/dL   HCT 33.4 (L) 36.0 - 46.0 %   MCV 80.7 78.0 - 100.0 fL   MCH 24.6 (L) 26.0 - 34.0 pg   MCHC 30.5 30.0 - 36.0 g/dL  RDW 15.4 11.5 - 15.5 %   Platelets 186 150 - 400 K/uL  Protime-INR     Status: Abnormal   Collection Time: 09/23/15  5:30 AM  Result Value Ref Range   Prothrombin Time 24.3 (H) 11.4 - 15.2 seconds   INR 2.14      Lipid Panel     Component Value Date/Time   CHOL 194 04/08/2014 1353   TRIG 219 (H) 04/08/2014 1353   HDL 40 (L) 04/08/2014 1353   CHOLHDL 4.9 04/08/2014 1353   VLDL 44 (H) 04/08/2014 1353   LDLCALC 110 (H) 04/08/2014 1353     No results found for: HGBA1C   Lab Results  Component Value Date   LDLCALC 110 (H) 04/08/2014   CREATININE 0.87 09/22/2015     HPI : 70 yo AAF with 3 month hx of right real calculi with on going flank pain. 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 She presents 9/6 with 72hours of increasing rt  flank pain, vomiting, diarrhea, and with fever 102 rectal. CT scan abd reveals rt hydronephrosis and obstructing stone rt. She has had 4 litre of IVF and has sbp 109. PCCM asked to admit to ICU. Patient was found to be septic secondary to right ureteral stone.   HOSPITAL COURSE:  * 1. Klebsiella pneumonia Sepsis- improving, from right renal stone, hydronephrosis. Blood cultures growing Klebsiella pneumoniae, initially treated with cefepime has been switched to ceftriaxone and then Ceftin. Vancomycin has been discontinued.Patient started on Ceftin 500 mg by mouth twice a day. Continue through 9/22 2. Right renal stone/hydronephrosis- status post cystoscopy, right retrograde ureterogram and insertion of right ureteral stent. Urology has signed off and follow patient in 2 weeks. 3. Combined systolic and diastolic heart failure- echocardiogram showed EF 45-50% with mildly reduced systolic function and grade 2 diastolic dysfunction. Continue with  Lasix 40 mg every other day. Patient would benefit from outpatient cardiology evaluation 4. History of pulmonary embolism-  patient was on Coumadin, she was started on heparin protocol. INR is 2.06, heparin has been discontinued. 5. Hypokalemia-  replaced, and follow BMP closely the outpatient setting   Discharge Exam:   Blood pressure (!) 142/62, pulse 85, temperature 98.4 F (36.9 C), temperature source Oral, resp. rate 20, height 5' 3" (1.6 m), weight 131.1 kg (289 lb 0.4 oz), SpO2 100 %. General:  Obese AAF, awake and alert Neuro:  Intact HEENT:No JVD/LAN, oral mucosa dry Cardiovascular:  HSR RRR Lung CTA Abdomen:  Obese, rt flank tender, +bs Musculoskeletal:  intact Skin: ho and dry     Follow-up Information    Rivet, Carly, MD. Schedule an appointment as soon as possible for a visit in 2 day(s).   Specialty:  Internal Medicine Why:  Hospital follow-up Contact information: [place] [address] [phone]         MACDIARMID,SCOTT A, MD. Schedule an appointment as soon as possible for a visit in 1 week(s).   Specialty:  Urology Why:  Hospital follow-up Contact information: [place] [address] [phone]           Signed: Reyne Dumas 09/23/2015, 9:15 AM        Time spent >45 mins

## 2015-09-23 NOTE — Clinical Social Work Placement (Signed)
   CLINICAL SOCIAL WORK PLACEMENT  NOTE  Date:  09/23/2015  Patient Details  Name: Caroline [name] MRN: [id number] Date of Birth: [birth year]  Clinical Social Work is seeking post-discharge placement for this patient at the Skilled  Nursing Facility level of care (*CSW will initial, date and re-position this form in  chart as items are completed):  Yes (Daughter emailed SNF list for [facility])   Patient/family provided with [facility] Clinical Social Work Department's list of facilities offering this level of care within the geographic area requested by the patient (or if unable, by the patient's family).  Yes   Patient/family informed of their freedom to choose among providers that offer the needed level of care, that participate in Medicare, Medicaid or managed care program needed by the patient, have an available bed and are willing to accept the patient.  Yes (Per list provided)   Patient/family informed of [facility]'s ownership interest in [facility] and [facility], as well as of the fact that they are under no obligation to receive care at these facilities.  PASRR submitted to EDS on 09/21/15     PASRR number received on 09/21/15     Existing PASRR number confirmed on       FL2 transmitted to all facilities in geographic area requested by pt/family on 09/21/15     FL2 transmitted to all facilities within larger geographic area on       Patient informed that his/her managed care company has contracts with or will negotiate with certain facilities, including the following:        Yes (09/21/15)   Patient/family informed of bed offers received.  Patient [facility] bed at [facility]     Physician recommends and patient chooses bed at      Patient to be transferred to [facility] on 09/23/15.  Patient to be transferred to facility by [facility]     Patient family notified on 09/23/15 of transfer.  Name of family member notified:  daughter Corrie Dandy     PHYSICIAN Please sign  FL2, Please prepare prescriptions     Additional Comment:    _______________________________________________ Rondel Baton, LCSW 09/23/2015, 12:07 PM

## 2015-09-23 NOTE — Progress Notes (Deleted)
Report called to nurse at [facility].   Pt to be transferred with all belongings. Pt without complaints and no pain at time of transfer.

## 2015-09-23 NOTE — Clinical Social Work Note (Signed)
Patient will discharge today per MD order. Patient will discharge to [facility] SN[facility] RN to call report prior to transportation to: [phone] Transportation: [facility]  CSW sent discharge summary to SNF for review.  Daughter Corrie Dandy updated.  Vickii Penna, MSW, LCSW  [phone]  Licensed Clinical Social Worker

## 2015-09-24 ENCOUNTER — Telehealth: Payer: Self-pay | Admitting: Pharmacist

## 2015-09-24 NOTE — Progress Notes (Signed)
Spoke with patient's daughter, Caroline [name], for [facility] follow-up  Transition Care Management Follow-up Telephone Call   Date discharged? 09/23/15  How have you been since you were released from the hospital? Patient still recovering, is now at [facility] (expected for up to 30 days)  Do you understand why you were in the hospital? yes  Do you understand the discharge instructions? Yes  Where were you discharged to? [facility] [address], [place], [place], [phone]  Items Reviewed:  Medications reviewed: yes  Allergies reviewed: yes  Dietary changes reviewed: yes  Referrals reviewed: yes  Functional Questionnaire:   Activities of Daily Living (ADLs):   She states they are independent in the following: feeding States they require assistance with the following: bathing and hygiene    Any transportation issues/concerns?: no   Any patient concerns? no   Confirmed importance and date/time of follow-up visits scheduled yes  Recommended daughter to schedule PCP follow up visit with patient once discharged from [facility].  Confirmed with patient if condition begins to worsen call PCP or go to the ER.  Patient was given the office number and encouraged to call back with question or concerns.  : yes

## 2015-09-29 ENCOUNTER — Other Ambulatory Visit: Payer: Self-pay | Admitting: Internal Medicine

## 2015-09-29 ENCOUNTER — Encounter: Payer: Self-pay | Admitting: Internal Medicine

## 2015-09-29 ENCOUNTER — Non-Acute Institutional Stay (SKILLED_NURSING_FACILITY): Payer: Medicare Other | Admitting: Internal Medicine

## 2015-09-29 DIAGNOSIS — A498 Other bacterial infections of unspecified site: Secondary | ICD-10-CM

## 2015-09-29 DIAGNOSIS — H8113 Benign paroxysmal vertigo, bilateral: Secondary | ICD-10-CM

## 2015-09-29 DIAGNOSIS — R42 Dizziness and giddiness: Secondary | ICD-10-CM

## 2015-09-29 DIAGNOSIS — K219 Gastro-esophageal reflux disease without esophagitis: Secondary | ICD-10-CM

## 2015-09-29 DIAGNOSIS — R0600 Dyspnea, unspecified: Secondary | ICD-10-CM | POA: Diagnosis not present

## 2015-09-29 DIAGNOSIS — E785 Hyperlipidemia, unspecified: Secondary | ICD-10-CM | POA: Diagnosis not present

## 2015-09-29 DIAGNOSIS — E876 Hypokalemia: Secondary | ICD-10-CM

## 2015-09-29 DIAGNOSIS — B961 Klebsiella pneumoniae [K. pneumoniae] as the cause of diseases classified elsewhere: Secondary | ICD-10-CM

## 2015-09-29 DIAGNOSIS — R5381 Other malaise: Secondary | ICD-10-CM

## 2015-09-29 DIAGNOSIS — I2782 Chronic pulmonary embolism: Secondary | ICD-10-CM

## 2015-09-29 DIAGNOSIS — J4530 Mild persistent asthma, uncomplicated: Secondary | ICD-10-CM

## 2015-09-29 DIAGNOSIS — N201 Calculus of ureter: Secondary | ICD-10-CM

## 2015-09-29 DIAGNOSIS — D649 Anemia, unspecified: Secondary | ICD-10-CM

## 2015-09-29 DIAGNOSIS — I5042 Chronic combined systolic (congestive) and diastolic (congestive) heart failure: Secondary | ICD-10-CM

## 2015-09-29 NOTE — Progress Notes (Signed)
LOCATION: [facility]  PCP: Rich Number, MD   Code Status: Full Code  Goals of care: Advanced Directive information Advanced Directives 09/19/2015  Does patient have an advance directive? No  Would patient like information on creating an advanced directive? No - patient declined information       Extended Emergency Contact Information Primary Emergency Contact: Nolting,Mary Address: [address]          [place], [place] Home Phone: [phone] Mobile Phone: [phone] Relation: Daughter Secondary Emergency Contact: Marry Guan Address: [address]          [place], [place] [address] [place] Home Phone: [phone] Relation: Daughter   Allergies  Allergen Reactions  . Penicillins Swelling  . Shellfish Allergy Swelling  . Tomato Swelling    Chief Complaint  Patient presents with  . New Admit To SNF    New Admission Visit     HPI:  Patient is a 70 y.o. female seen today for short term rehabilitation post hospital admission from 09/16/15-09/23/15 with klebsiella pneumonia sepsis and right ureteral calculi. She was started on iv fluids and iv antibiotics. She underwent cystoscopy and right retrograde ureterogram and had right ureteral stent placed. She was followed by urology. She has PMH of pulmonary embolism, HTN, combined CHF. She is seen in her room today.   Review of Systems:  Constitutional: Negative for fever, chills, diaphoresis. Slow return of energy but still feels weak and tired. HENT: Negative for congestion, nasal discharge, sore throat, difficulty swallowing. Positive for occasional headaches.   Eyes: Negative for blurred vision, double vision and discharge. Wears glasses. Respiratory: Negative for wheezing. Positive for dry cough and shortness of breath with exertion.  Cardiovascular: Negative for chest pain, palpitations, leg swelling.  Gastrointestinal: Negative for heartburn, vomiting,loss of  appetite. Positive for nausea and abdominal discomfort. Last bowel movement was yesterday.  Genitourinary: Negative for dysuria and flank pain.  Musculoskeletal: Negative for back pain, fall in the facility.  Skin: Negative for itching, rash.  Neurological: Positive for dizziness with change of position. Psychiatric/Behavioral: Negative for depression    Past Medical History:  Diagnosis Date  . Asthma   . Diabetes mellitus   . DVT, lower extremity, recurrent (HCC)    Patient had unprovoked PE on 2002 and DVT in right lower extremety 2008.  [name] Hypertension   . PE (pulmonary embolism)    Patient had unprovoked PE on 2002  . Vertigo    Past Surgical History:  Procedure Laterality Date  . CYSTOSCOPY W/ URETERAL STENT PLACEMENT Right 09/16/2015   Procedure: CYSTOSCOPY WITH RETROGRADE PYELOGRAM/URETERAL STENT PLACEMENT;  Surgeon: Alfredo Martinez, MD;  Location: [facility] OR;  Service: Urology;  Laterality: Right;   Social History:   reports that she quit smoking 10 days ago. Her smoking use included Cigarettes. She has never used smokeless tobacco. She reports that she does not drink alcohol or use drugs.  No family history on file.  Medications:   Medication List       Accurate as of 09/29/15 11:02 AM. Always use your most recent med list.          acetaminophen 500 MG tablet Commonly known as:  TYLENOL Take 500 mg by mouth every 6 (six) hours as needed for mild pain or moderate pain. pain   calcium citrate-vitamin D 315-200 MG-UNIT tablet Commonly known as:  CITRACAL+D Take 2 tablets by mouth daily.   cefUROXime 500 MG tablet Commonly known as:  CEFTIN  Take 1 tablet (500 mg total) by mouth 2 (two) times daily with a meal.   furosemide 40 MG tablet Commonly known as:  LASIX Take 1 tablet (40 mg total) by mouth every other day.   hydrocortisone 2.5 % rectal cream Commonly known as:  PROCTOSOL HC PLACE 1 APPLICATION RECTALLY 2 (TWO) TIMES DAILY.   meclizine 25 MG  tablet Commonly known as:  ANTIVERT TAKE 1 TABLET (25 MG TOTAL) BY MOUTH 3 (THREE) TIMES DAILY AS NEEDED.   metoprolol tartrate 25 MG tablet Commonly known as:  LOPRESSOR TAKE 1 TABLET (25 MG TOTAL) BY MOUTH 2 (TWO) TIMES DAILY.   montelukast 10 MG tablet Commonly known as:  SINGULAIR Take 1 tablet (10 mg total) by mouth at bedtime.   omeprazole 20 MG capsule Commonly known as:  PRILOSEC TAKE 1 CAPSULE (20 MG TOTAL) BY MOUTH DAILY.   pantoprazole 40 MG tablet Commonly known as:  PROTONIX Take 1 tablet (40 mg total) by mouth daily.   potassium chloride SA 20 MEQ tablet Commonly known as:  K-DUR,KLOR-CON Take 2 tablets (40 mEq total) by mouth daily.   pravastatin 80 MG tablet Commonly known as:  PRAVACHOL TAKE 1 TABLET (80 MG TOTAL) BY MOUTH DAILY.   PROAIR HFA 108 (90 Base) MCG/ACT inhaler Generic drug:  albuterol INHALE 2 PUFFS INTO THE LUNGS EVERY 6 (SIX) HOURS AS NEEDED FOR WHEEZING OR SHORTNESS OF BREATH.   SYMBICORT 80-4.5 MCG/ACT inhaler Generic drug:  budesonide-formoterol USE 2 SPRAYS TWICE DAILY   warfarin 2 MG tablet Commonly known as:  COUMADIN Take 2 mg by mouth daily. Take every evening on Sunday, Thursday and Saturday.   warfarin 4 MG tablet Commonly known as:  COUMADIN Take 4 mg by mouth daily. Take on Monday, Tuesday, Wednesday and Friday       Immunizations: Immunization History  Administered Date(s) Administered  . Influenza Split 10/24/2011  . Influenza Whole 11/07/2005  . Influenza,inj,Quad PF,36+ Mos 11/20/2012, 10/14/2013, 11/03/2014  . PPD Test 09/23/2015  . Pneumococcal Polysaccharide-23 08/11/2003, 05/29/2012     Physical Exam: Vitals:   09/29/15 1055  BP: (!) 150/73  Pulse: 78  Resp: 18  Temp: 97.3 F (36.3 C)  TempSrc: Oral  SpO2: 95%  Weight: 289 lb (131.1 kg)  Height: 5\' 3"  (1.6 m)   Body mass index is 51.19 kg/m.  General- elderly female, morbidly obese, in no acute distress Head- normocephalic, atraumatic Nose-  no maxillary or frontal sinus tenderness, no nasal discharge Throat- moist mucus membrane, missing teeth Eyes- PERRLA, EOMI, no pallor, no icterus Neck- no cervical lymphadenopathy Cardiovascular- normal s1,s2, no murmur, trace leg edema Respiratory- bilateral poor air movement, no wheeze, no rhonchi, no crackles, no use of accessory muscles, on 1 l o2 by nasal canula Abdomen- bowel sounds present, soft, non tender Musculoskeletal- able to move all 4 extremities, generalized weakness Neurological- alert and oriented to person, place and time Skin- warm and dry, dressing to right lower back area clean and dry Psychiatry- normal mood and affect    Labs reviewed: Basic Metabolic Panel:  Recent Labs  16/10/96 0321 09/21/15 1913 09/22/15 0431  NA 144 143 144  K 3.7 2.8* 3.0*  CL 115* 106 106  CO2 25 30 30   GLUCOSE 134* 109* 100*  BUN 8 5* 6  CREATININE 0.75 0.86 0.87  CALCIUM 7.8* 8.4* 8.4*   Liver Function Tests:  Recent Labs  03/20/15 1947 09/16/15 0538  AST 38 27  ALT 33 25  ALKPHOS 71 75  BILITOT 0.4 0.6  PROT 7.2 7.5  ALBUMIN 3.4* 3.7    Recent Labs  09/16/15 0538  LIPASE 36   No results for input(s): AMMONIA in the last 8760 hours. CBC:  Recent Labs  03/20/15 1947  09/21/15 0417 09/22/15 0431 09/23/15 0530  WBC 6.7  < > 9.8 7.9 8.9  NEUTROABS 4.3  --   --   --   --   HGB 11.4*  < > 9.5* 9.5* 10.2*  HCT 36.1  < > 31.6* 31.5* 33.4*  MCV 78.8  < > 81.0 80.2 80.7  PLT 167  < > 133* 173 186  < > = values in this interval not displayed. Cardiac Enzymes:  Recent Labs  09/16/15 1435  TROPONINI 0.10*   BNP: Invalid input(s): POCBNP CBG:  Recent Labs  09/16/15 1348 09/16/15 1549  GLUCAP 177* 163*    Radiological Exams: Ct Abdomen Pelvis W Contrast  Result Date: 09/16/2015 CLINICAL DATA:  Right lower quadrant abdominal pain. Nausea vomiting diarrhea EXAM: CT ABDOMEN AND PELVIS WITH CONTRAST TECHNIQUE: Multidetector CT imaging of the abdomen  and pelvis was performed using the standard protocol following bolus administration of intravenous contrast. CONTRAST:  100mL ISOVUE-300 IOPAMIDOL (ISOVUE-300) INJECTION 61% COMPARISON:  None. FINDINGS: Lower chest: Mild bibasilar atelectasis. Cardiac enlargement. No pleural effusion. Hepatobiliary: No focal liver lesion. Liver density within normal limits. Gallbladder and bile ducts normal. Pancreas: Negative Spleen: Negative Adrenals/Urinary Tract: Right-sided hydronephrosis and hydroureter. Ureter is dilated down to a stone at the iliac crossing. Obstructing stone measures 4 x 8 mm. Additional calculi in the right renal calices. The largest stone in the right lower pole measures 8 mm. There is perinephric edema. There is a 5 mm calcification in the retroperitoneum on the right near the proximal ureter which appears to be a calcified lymph node and appears outside of the ureter. No left renal calculi or hydronephrosis. Left upper pole 15 mm cyst. Urinary bladder normal. Stomach/Bowel: Stomach and duodenum normal. Negative for small bowel obstruction. No bowel edema. Sigmoid diverticulosis without changes of diverticulitis. Vascular/Lymphatic: Mild atherosclerotic calcification in the aorta and iliac arteries. No aneurysm. No lymphadenopathy Reproductive: Hysterectomy.  No pelvic mass. Other: No free fluid. Negative for hernia. Image quality degraded by motion. Musculoskeletal: Lumbar disc and facet degeneration L4-5 and L5-S1. Grade 1 anterior slip L4-5. No acute skeletal abnormality. IMPRESSION: Moderate right hydronephrosis. 4 x 8 mm obstructing stone distal right ureter at the level of the iliac crossing. Additional nonobstructing stones in the right kidney. Note is made of a 5 mm calcification near the right ureter proximally which is felt to be outside of the renal collecting system. Electronically Signed   By: Marlan Palauharles  Clark M.D.   On: 09/16/2015 09:29   Dg Cystogram  Result Date: 09/16/2015 CLINICAL  DATA:  70 year old female undergoing right-sided ureteral stent placement EXAM: CYSTOGRAM - 3+ VIEW CONTRAST:  Please see operative report for detail FLUOROSCOPY TIME:  Fluoroscopy Time:  1 minutes 27 seconds Number of Acquired Spot Images: 0 COMPARISON:  CT abdomen/ pelvis 09/16/2015 FINDINGS: 2 intraoperative spot images demonstrate placement of a right-sided double-J ureteral stent. The proximal loop is appropriately reconstituted and overlies and upper pole calyx. The lower loop is partially reconstituted and overlies the bladder. Partial contrast opacification of the right renal collecting system demonstrates mild hydronephrosis. IMPRESSION: 1. Mild right hydronephrosis. 2. Right-sided double-J ureteral stent. Electronically Signed   By: Malachy MoanHeath  McCullough M.D.   On: 09/16/2015 14:45   [facility]  Result Date: 09/16/2015 CLINICAL DATA:  Nausea and diarrhea. EXAM: PORTABLE CHEST 1 VIEW COMPARISON:  03/20/2015. FINDINGS: Cardiomegaly with bilateral pulmonary interstitial prominence consistent congestive heart failure. Pneumonitis cannot be excluded. Low lung volumes with bibasilar atelectasis . No prominent pleural effusion or pneumothorax . IMPRESSION: 1. Cardiomegaly with bilateral from interstitial prominence consistent with congestive heart failure. Pneumonitis cannot be excluded. 2. Low lung volumes with bibasilar atelectasis. Electronically Signed   By: Maisie Fus  Register   On: 09/16/2015 09:39    Assessment/Plan  Physical deconditioning Will have her work with physical therapy and occupational therapy team to help with gait training and muscle strengthening exercises.fall precautions. Skin care. Encourage to be out of bed.   Klebsiella infection Afebrile. Monitor vital signs. Will have patient work with PT/OT as tolerated to regain strength and restore function.  Fall precautions are in place. Continue and complete her course of cefuroxime bid on 10/02/15.   Right ureteral calculi S/p  right ureteral stent placement. Has urology follow up. Monitor for urinary symptom  Hypokalemia Continue her kcl and check bmp and mg  Dyspnea Her asthma, chf and deconditioning likely contributing. Add incentive spirometer. Wean off o2 as tolerated. Will make cardiology appointment for her chf  CHF EF 45-50% on echocardiogram. Currently on metoprolol 25 mg bid, lasix 40 mg every other day. Make cardiology follow up.check weight 3 days a week. Continue kcl and check bmp  Anemia Monitor cbc  Chronic asthma Continue her bronchodilator, wean off o2 as tolerated. Continue singulair  Pulmonary embolism Continue warfarin and monitor INR  HLD Continue pravastatin  GERD On protonix 40 mg daily and prilosec 20 mg daily, d/c prilosec and monitor  Vertigo On meclizine 25 mg tid prn, mentions that she takes 25 mg bid at home, change this to bid with additional one dosing on prn basis and monitor.    Goals of care: short term rehabilitation   Labs/tests ordered: CBC, CMP, MG 09/30/15  Family/ staff Communication: reviewed care plan with patient and nursing supervisor    Oneal Grout, MD Internal Medicine [facility] [address] [place], [place] [address] Cell Phone (Monday-Friday 8 am - 5 pm): [phone] On Call: [phone] and follow prompts after 5 pm and on weekends Office Phone: [phone] Office Fax: [phone]

## 2015-09-30 DIAGNOSIS — N201 Calculus of ureter: Secondary | ICD-10-CM | POA: Diagnosis not present

## 2015-09-30 DIAGNOSIS — A419 Sepsis, unspecified organism: Secondary | ICD-10-CM | POA: Diagnosis not present

## 2015-10-08 ENCOUNTER — Other Ambulatory Visit: Payer: Self-pay | Admitting: Internal Medicine

## 2015-10-08 ENCOUNTER — Encounter: Payer: Self-pay | Admitting: *Deleted

## 2015-10-20 DIAGNOSIS — N201 Calculus of ureter: Secondary | ICD-10-CM | POA: Diagnosis not present

## 2015-10-22 ENCOUNTER — Non-Acute Institutional Stay (SKILLED_NURSING_FACILITY): Payer: Medicare Other | Admitting: Family

## 2015-10-22 ENCOUNTER — Encounter: Payer: Self-pay | Admitting: Family

## 2015-10-22 DIAGNOSIS — R2681 Unsteadiness on feet: Secondary | ICD-10-CM | POA: Diagnosis not present

## 2015-10-22 DIAGNOSIS — I10 Essential (primary) hypertension: Secondary | ICD-10-CM

## 2015-10-22 DIAGNOSIS — J4540 Moderate persistent asthma, uncomplicated: Secondary | ICD-10-CM

## 2015-10-22 DIAGNOSIS — E782 Mixed hyperlipidemia: Secondary | ICD-10-CM

## 2015-10-22 DIAGNOSIS — I5022 Chronic systolic (congestive) heart failure: Secondary | ICD-10-CM | POA: Diagnosis not present

## 2015-10-22 NOTE — Progress Notes (Signed)
Patient ID: Caroline [name], female   DOB: 04-14-1945, 70 y.o.   MRN: [id number]  Location:  [facility] Room Number: 705 P Place of Service:  SNF (31)  Provider:Ignatz Deis FNP-C   PCP: Rich Number, MD Patient Care Team: Su Hoff, MD as PCP - General  Extended Emergency Contact Information Primary Emergency Contact: Weber,Mary Address: [address]          [place], [place] Home Phone: [phone] Mobile Phone: [phone] Relation: Daughter Secondary Emergency Contact: Marry Guan Address: [address]          [place], [place] [address] [place] Home Phone: [phone] Relation: Daughter  Code Status: Full Code  Goals of care:  Advanced Directive information Advanced Directives 10/22/2015  Does patient have an advance directive? No  Would patient like information on creating an advanced directive? No - patient declined information     Allergies  Allergen Reactions  . Penicillins Swelling  . Shellfish Allergy Swelling  . Tomato Swelling    Chief Complaint  Patient presents with  . Discharge Note    HPI:  70 y.o. female  Seen at [facility] for discharge home.She was here for short term rehabilitation post hospital admission from 09/16/15-09/23/15 with klebsiella pneumonia sepsis and right ureteral calculi. She was started on I.V  fluids and antibiotics. She underwent cystoscopy and right retrograde ureterogram and had right ureteral stent placed. She was followed by urology. She has a medical history of  Type 2 DM, HTN, PE, DVT, asthma among others. She is seen in her room today. She denies any new acute issues this visit. She was recently seen by [facility] 10/20/2015 plan for right ureteroscopy in two weeks. She is currently on warfarin 3 mg tablet recent INR 2.4 ( goal 2-3 ) next INR due 10/26/2015. She has worked well with PT/OT now stable for discharge home.She  will be discharged home with Home health PT/OT to continue with ROM, Exercise, Gait stability and muscle strengthening.She will also need a HH RN for medication management and INR check. She will require DME a Bariatric rollator to allow her to maintain current level of independence with ADL's. Home health services will be arranged by facility social worker prior to discharge.She will d/c with meds from the facility. Prescription medication will be written x 1 month then patient to follow up with PCP in 1-2 weeks. Facility staff report no new concerns.     Past Medical History:  Diagnosis Date  . Asthma   . Diabetes mellitus   . DVT, lower extremity, recurrent (HCC)    Patient had unprovoked PE on 2002 and DVT in right lower extremety 2008.  [name] Hypertension   . PE (pulmonary embolism)    Patient had unprovoked PE on 2002  . Vertigo     Past Surgical History:  Procedure Laterality Date  . CYSTOSCOPY W/ URETERAL STENT PLACEMENT Right 09/16/2015   Procedure: CYSTOSCOPY WITH RETROGRADE PYELOGRAM/URETERAL STENT PLACEMENT;  Surgeon: Alfredo Martinez, MD;  Location: [facility] OR;  Service: Urology;  Laterality: Right;      reports that she quit smoking about 4 weeks ago. Her smoking use included Cigarettes. She has never used smokeless tobacco. She reports that she does not drink alcohol or use drugs. Social History   Social History  . Marital status: Divorced    Spouse name: N/A  . Number of children: N/A  . Years of education:  N/A   Occupational History  . Not on file.   Social History Main Topics  . Smoking status: Former Smoker    Types: Cigarettes    Quit date: 09/19/2015  . Smokeless tobacco: Never Used  . Alcohol use No  . Drug use: No  . Sexual activity: No   Other Topics Concern  . Not on file   Social History Narrative  . No narrative on file    Allergies  Allergen Reactions  . Penicillins Swelling  . Shellfish Allergy Swelling  . Tomato Swelling    Pertinent  Health  Maintenance Due  Topic Date Due  . COLONOSCOPY  02/12/1997  . DEXA SCAN  02/13/2012  . PNA vac Low Risk Adult (2 of 2 - PCV13) 05/29/2013  . INFLUENZA VACCINE  08/11/2015  . MAMMOGRAM  11/27/2015  . COLON CANCER SCREENING ANNUAL FOBT  01/15/2016    Medications:   Medication List       Accurate as of 10/22/15 10:01 AM. Always use your most recent med list.          acetaminophen 500 MG tablet Commonly known as:  TYLENOL Take 500 mg by mouth every 6 (six) hours as needed for mild pain or moderate pain. pain   atorvastatin 10 MG tablet Commonly known as:  LIPITOR Take 10 mg by mouth daily.   calcium citrate-vitamin D 315-200 MG-UNIT tablet Commonly known as:  CITRACAL+D Take 2 tablets by mouth daily.   feeding supplement (PRO-STAT SUGAR FREE 64) Liqd Take 30 mLs by mouth daily.   furosemide 40 MG tablet Commonly known as:  LASIX Take 1 tablet (40 mg total) by mouth every other day.   magnesium oxide 400 MG tablet Commonly known as:  MAG-OX Take 400 mg by mouth 2 (two) times daily.   meclizine 25 MG tablet Commonly known as:  ANTIVERT TAKE 1 TABLET (25 MG TOTAL) BY MOUTH 3 (THREE) TIMES DAILY AS NEEDED.   metoprolol tartrate 25 MG tablet Commonly known as:  LOPRESSOR TAKE 1 TABLET (25 MG TOTAL) BY MOUTH 2 (TWO) TIMES DAILY.   montelukast 10 MG tablet Commonly known as:  SINGULAIR Take 1 tablet (10 mg total) by mouth at bedtime.   pantoprazole 40 MG tablet Commonly known as:  PROTONIX Take 1 tablet (40 mg total) by mouth daily.   potassium chloride SA 20 MEQ tablet Commonly known as:  K-DUR,KLOR-CON Take 2 tablets (40 mEq total) by mouth daily.   PROAIR HFA 108 (90 Base) MCG/ACT inhaler Generic drug:  albuterol INHALE 2 PUFFS INTO THE LUNGS EVERY 6 (SIX) HOURS AS NEEDED FOR WHEEZING OR SHORTNESS OF BREATH.   PROCTOSOL HC 2.5 % rectal cream Generic drug:  hydrocortisone PLACE 1 APPLICATION RECTALLY 2 TIMES DAILY.   SYMBICORT 80-4.5 MCG/ACT  inhaler Generic drug:  budesonide-formoterol USE 2 SPRAYS TWICE DAILY   warfarin 3 MG tablet Commonly known as:  COUMADIN Take 2 mg by mouth daily.       Review of Systems  Constitutional: Negative for activity change, appetite change, chills, fatigue and fever.  HENT: Negative for congestion, rhinorrhea, sinus pain, sinus pressure and sneezing.   Eyes: Negative.   Respiratory: Negative for cough, chest tightness, shortness of breath and wheezing.   Cardiovascular: Negative for chest pain, palpitations and leg swelling.  Gastrointestinal: Negative for abdominal distention, abdominal pain, constipation, diarrhea, nausea and vomiting.  Endocrine: Negative.   Genitourinary: Negative for dysuria, flank pain, frequency and urgency.  Musculoskeletal: Positive for gait problem.  Skin:  Negative for color change, pallor and rash.  Neurological: Negative for dizziness, seizures, syncope, light-headedness and headaches.  Hematological: Does not bruise/bleed easily.  Psychiatric/Behavioral: Negative for agitation, confusion, hallucinations and sleep disturbance. The patient is not nervous/anxious.     Vitals:   10/22/15 0952  BP: 114/80  Pulse: 80  Resp: 18  Temp: 97.4 F (36.3 C)  TempSrc: Oral  SpO2: 95%  Weight: 270 lb (122.5 kg)  Height: 5\' 3"  (1.6 m)   Body mass index is 47.83 kg/m. Physical Exam  Constitutional: She is oriented to person, place, and time. She appears well-developed and well-nourished. No distress.  HENT:  Head: Normocephalic.  Mouth/Throat: Oropharynx is clear and moist. No oropharyngeal exudate.  Eyes: Conjunctivae and EOM are normal. Pupils are equal, round, and reactive to light. Right eye exhibits no discharge. Left eye exhibits no discharge.  Neck: Normal range of motion. No JVD present. No thyromegaly present.  Pulmonary/Chest: Effort normal and breath sounds normal. No respiratory distress. She has no wheezes. She has no rales.  Abdominal: Soft.  Bowel sounds are normal. She exhibits no distension. There is no tenderness. There is no rebound and no guarding.  Musculoskeletal: She exhibits no edema, tenderness or deformity.  Moves x 4 extremities. Unsteady gait  Lymphadenopathy:    She has no cervical adenopathy.  Neurological: She is oriented to person, place, and time.  Skin: Skin is warm and dry. No rash noted. No erythema. No pallor.  Psychiatric: She has a normal mood and affect.   Labs reviewed: Basic Metabolic Panel:  Recent Labs  40/98/11 0321 09/21/15 1913 09/22/15 0431  NA 144 143 144  K 3.7 2.8* 3.0*  CL 115* 106 106  CO2 25 30 30   GLUCOSE 134* 109* 100*  BUN 8 5* 6  CREATININE 0.75 0.86 0.87  CALCIUM 7.8* 8.4* 8.4*   Liver Function Tests:  Recent Labs  03/20/15 1947 09/16/15 0538  AST 38 27  ALT 33 25  ALKPHOS 71 75  BILITOT 0.4 0.6  PROT 7.2 7.5  ALBUMIN 3.4* 3.7    Recent Labs  09/16/15 0538  LIPASE 36   CBC:  Recent Labs  03/20/15 1947  09/21/15 0417 09/22/15 0431 09/23/15 0530  WBC 6.7  < > 9.8 7.9 8.9  NEUTROABS 4.3  --   --   --   --   HGB 11.4*  < > 9.5* 9.5* 10.2*  HCT 36.1  < > 31.6* 31.5* 33.4*  MCV 78.8  < > 81.0 80.2 80.7  PLT 167  < > 133* 173 186  < > = values in this interval not displayed. Cardiac Enzymes:  Recent Labs  09/16/15 1435  TROPONINI 0.10*    Recent Labs  09/16/15 1348 09/16/15 1549  GLUCAP 177* 163*   Assessment/Plan:   Unsteady gait  Has worked with PT/OT will discharge home with PT/OT for ROM,exercise, gait stability and muscle strengthening.She will need a Bariatric rollator to allow her to maintain current level of independence with ADL's.will need a 3-1 commode.Fall and safety precaution.  HTN B/p stable.continue on metoprolol and cozaar. BMP in 1-2 weeks with PCP    Asthma  Breathing stable. Continue on ProAir, Symbicort and Montelukast.   Hyperlipidemia  Continue on pravastatin.monitor Lipid panel    CHF Stable. Continue on  Furosemide,metoprolol and cozaar.monitor daily weight. Continue on fluid restriction 1.5 Liters daily.   DVT No signs of bleeding.Continue on warfarin.HH RN for INR management next INR due 10/26/2015 previous INR 2.4  Patient is being discharged with the following home health services:   - PT/OT for ROM,exercise, gait stability and muscle strengthening. -  HH RN for INR management next INR due 10/26/2015 previous INR 2.4   Patient is being discharged with the following durable medical equipment:   - Bariatric Rollator to allow her to maintain current level of independence with ADL's  - A 3-1 commode.  Patient has been advised to f/u with their PCP in 1-2 weeks to for a transitions of care visit.  Social services at their facility was responsible for arranging this appointment.  Pt was provided with adequate prescriptions of noncontrolled medications to reach the scheduled appointment .  For controlled substances, a limited supply was provided as appropriate for the individual patient.  If the pt normally receives these medications from a pain clinic or has a contract with another physician, these medications should be received from that clinic or physician only).    Future labs/tests needed:  CBC, BMP in 1-2 weeks. INR due 10/26/2015 with [facility] RN

## 2015-10-26 ENCOUNTER — Ambulatory Visit: Payer: Medicare Other | Admitting: Internal Medicine

## 2015-10-26 ENCOUNTER — Telehealth: Payer: Self-pay | Admitting: Pharmacist

## 2015-10-26 ENCOUNTER — Encounter: Payer: Self-pay | Admitting: Cardiology

## 2015-10-26 DIAGNOSIS — Z5181 Encounter for therapeutic drug level monitoring: Secondary | ICD-10-CM | POA: Diagnosis not present

## 2015-10-26 DIAGNOSIS — I5040 Unspecified combined systolic (congestive) and diastolic (congestive) heart failure: Secondary | ICD-10-CM | POA: Diagnosis not present

## 2015-10-26 DIAGNOSIS — Z87442 Personal history of urinary calculi: Secondary | ICD-10-CM | POA: Diagnosis not present

## 2015-10-26 DIAGNOSIS — Z48816 Encounter for surgical aftercare following surgery on the genitourinary system: Secondary | ICD-10-CM | POA: Diagnosis not present

## 2015-10-26 DIAGNOSIS — Z7901 Long term (current) use of anticoagulants: Secondary | ICD-10-CM | POA: Diagnosis not present

## 2015-10-26 DIAGNOSIS — Z960 Presence of urogenital implants: Secondary | ICD-10-CM | POA: Diagnosis not present

## 2015-10-26 DIAGNOSIS — I2699 Other pulmonary embolism without acute cor pulmonale: Secondary | ICD-10-CM | POA: Diagnosis not present

## 2015-10-26 DIAGNOSIS — E785 Hyperlipidemia, unspecified: Secondary | ICD-10-CM | POA: Diagnosis not present

## 2015-10-26 DIAGNOSIS — Z792 Long term (current) use of antibiotics: Secondary | ICD-10-CM | POA: Diagnosis not present

## 2015-10-26 DIAGNOSIS — Z87891 Personal history of nicotine dependence: Secondary | ICD-10-CM | POA: Diagnosis not present

## 2015-10-26 DIAGNOSIS — Z86718 Personal history of other venous thrombosis and embolism: Secondary | ICD-10-CM | POA: Diagnosis not present

## 2015-10-26 DIAGNOSIS — I110 Hypertensive heart disease with heart failure: Secondary | ICD-10-CM | POA: Diagnosis not present

## 2015-10-26 NOTE — Telephone Encounter (Signed)
Called by Lorin Picket, RN with [facility] providing a home collected INR = 3.1 on 3mg  warfarin by mouth daily since discharge from [facility] for reconditioning secondary to a hospital stay. She is on SMX-TMP that was started last Friday and will end this Friday. Decreased dose from 3mg  daily to:  1/2 tablet (of 3mg  strength) for today and tomorrow; afterwards, return to 1 tablet (3mg ) qd until next INR collected on Monday 23-OCT-17.

## 2015-10-26 NOTE — Telephone Encounter (Signed)
Called patient and provided new instructions: 1/2 (of your 3mg  strength warfarin tablet) for 2 days; then--take 1 tablet (3mg ) qd. INR re-collect in 1 wk. [facility] RN to call me with results.

## 2015-10-28 ENCOUNTER — Encounter: Payer: Self-pay | Admitting: Cardiology

## 2015-10-28 ENCOUNTER — Ambulatory Visit (INDEPENDENT_AMBULATORY_CARE_PROVIDER_SITE_OTHER): Payer: Medicare Other | Admitting: Cardiology

## 2015-10-28 ENCOUNTER — Encounter (INDEPENDENT_AMBULATORY_CARE_PROVIDER_SITE_OTHER): Payer: Self-pay

## 2015-10-28 VITALS — BP 126/70 | HR 87 | Ht 63.0 in | Wt 266.4 lb

## 2015-10-28 DIAGNOSIS — Z79899 Other long term (current) drug therapy: Secondary | ICD-10-CM

## 2015-10-28 DIAGNOSIS — I5020 Unspecified systolic (congestive) heart failure: Secondary | ICD-10-CM

## 2015-10-28 MED ORDER — LISINOPRIL 5 MG PO TABS: 5.0000 mg | ORAL_TABLET | Freq: Every day | ORAL | 3 refills | Status: DC

## 2015-10-28 MED ORDER — METOPROLOL SUCCINATE ER 50 MG PO TB24: 50.0000 mg | ORAL_TABLET | Freq: Every day | ORAL | 3 refills | Status: DC

## 2015-10-28 NOTE — Progress Notes (Signed)
Electrophysiology Office Note   Date:  10/28/2015   ID:  Caroline MccallumDaisy N Dillin, DOB 05/10/1945, MRN [id number]  PCP:  Rich Numberivet, Carly, MD  Cardiologist:  Regan LemmingWill Martin Camnitz, MD    Chief Complaint  Patient presents with  . New Patient (Initial Visit)    CHF     History of Present Illness: Caroline [name] is a 70 y.o. female who presents today for electrophysiology evaluation.   S/p hospital admission from 09/16/15-09/23/15 with klebsiella pneumonia sepsis and right ureteral calculi. She was started on iv fluids and iv antibiotics. She underwent cystoscopy and right retrograde ureterogram and had right ureteral stent placed. She was followed by urology. She has PMH of pulmonary embolism, HTN, combined CHF.    Today, she denies symptoms of palpitations, chest pain,  orthopnea, PND, lower extremity edema, claudication, dizziness, presyncope, syncope, bleeding, or neurologic sequela. The patient is tolerating medications without difficulties. She says that she does have some dyspnea on exertion but has been doing much better since losing approximately 30 pounds of fluid weight while in the nursing home. She is continuing to lose weight with her diuretics. Currently she has no acute chest pain, PND, or orthopnea.    Past Medical History:  Diagnosis Date  . Asthma   . Diabetes mellitus   . DVT, lower extremity, recurrent (HCC)    Patient had unprovoked PE on 2002 and DVT in right lower extremety 2008.  [name]. Hypertension   . PE (pulmonary embolism)    Patient had unprovoked PE on 2002  . Vertigo    Past Surgical History:  Procedure Laterality Date  . CYSTOSCOPY W/ URETERAL STENT PLACEMENT Right 09/16/2015   Procedure: CYSTOSCOPY WITH RETROGRADE PYELOGRAM/URETERAL STENT PLACEMENT;  Surgeon: Alfredo MartinezScott MacDiarmid, MD;  Location: [facility] OR;  Service: Urology;  Laterality: Right;     Current Outpatient Prescriptions  Medication Sig Dispense Refill  . acetaminophen (TYLENOL) 500 MG tablet Take 500 mg by mouth  every 6 (six) hours as needed for mild pain or moderate pain. pain    . Amino Acids-Protein Hydrolys (FEEDING SUPPLEMENT, PRO-STAT SUGAR FREE 64,) LIQD Take 30 mLs by mouth daily.    [name]. atorvastatin (LIPITOR) 10 MG tablet Take 10 mg by mouth daily.    . calcium citrate-vitamin D (CITRACAL+D) 315-200 MG-UNIT tablet Take 2 tablets by mouth daily. 100 tablet 3  . furosemide (LASIX) 40 MG tablet Take 1 tablet (40 mg total) by mouth every other day. 30 tablet 0  . magnesium oxide (MAG-OX) 400 MG tablet Take 400 mg by mouth 2 (two) times daily.    . meclizine (ANTIVERT) 25 MG tablet TAKE 1 TABLET (25 MG TOTAL) BY MOUTH 3 (THREE) TIMES DAILY AS NEEDED. 90 tablet 1  . metoprolol tartrate (LOPRESSOR) 25 MG tablet TAKE 1 TABLET (25 MG TOTAL) BY MOUTH 2 (TWO) TIMES DAILY. 120 tablet 3  . montelukast (SINGULAIR) 10 MG tablet Take 1 tablet (10 mg total) by mouth at bedtime. 30 tablet 11  . pantoprazole (PROTONIX) 40 MG tablet Take 1 tablet (40 mg total) by mouth daily. 30 tablet 0  . potassium chloride SA (K-DUR,KLOR-CON) 20 MEQ tablet Take 2 tablets (40 mEq total) by mouth daily. 2 tablet 0  . PROAIR HFA 108 (90 BASE) MCG/ACT inhaler INHALE 2 PUFFS INTO THE LUNGS EVERY 6 (SIX) HOURS AS NEEDED FOR WHEEZING OR SHORTNESS OF BREATH. 8.5 Inhaler 5  . PROCTOSOL HC 2.5 % rectal cream PLACE 1 APPLICATION RECTALLY 2 TIMES DAILY. 28.35 g 5  .  SYMBICORT 80-4.5 MCG/ACT inhaler USE 2 SPRAYS TWICE DAILY 10.2 Inhaler 5  . warfarin (COUMADIN) 3 MG tablet Take 2 mg by mouth daily.      No current facility-administered medications for this visit.     Allergies:   Penicillins; Shellfish allergy; and Tomato   Social History:  The patient  reports that she quit smoking about 5 weeks ago. Her smoking use included Cigarettes. She has never used smokeless tobacco. She reports that she does not drink alcohol or use drugs.   Family History:  The patient's family history includes Cancer in her mother; Colon cancer in her other;  Diabetes in her brother; Heart Problems (age of onset: 60) in her other; Hypertension in her brother, sister, and sister.    ROS:  Please see the history of present illness.   Otherwise, review of systems is positive for weight change, appetite change, fatigue, leg swelling, cough, dyspnea on exertion, depression, muscle pain, joint swelling, balance problems, headaches, easy bruising.   All other systems are reviewed and negative.    PHYSICAL EXAM: VS:  BP 126/70   Pulse 87   Ht 5\' 3"  (1.6 m)   Wt 266 lb 6.4 oz (120.8 kg)   BMI 47.19 kg/m  , BMI Body mass index is 47.19 kg/m. GEN: Well nourished, well developed, in no acute distress  HEENT: normal  Neck: no JVD, carotid bruits, or masses Cardiac: RRR; no murmurs, rubs, or gallops,no edema  Respiratory:  clear to auscultation bilaterally, normal work of breathing GI: soft, nontender, nondistended, + BS MS: no deformity or atrophy  Skin: warm and dry Neuro:  Strength and sensation are intact Psych: euthymic mood, full affect  EKG:  EKG is ordered today. Personal review of the ekg ordered shows sinus rhythm, right bundle branch block, left anterior fascicular block, rate 87  PAD Screen 10/28/2015  Previous PAD dx? No  Previous surgical procedure? Yes  Dates of procedures 09/16/15  Pain with walking? No  Feet/toe relief with dangling? No  Painful, non-healing ulcers? No  Extremities discolored? No      Recent Labs: 09/16/2015: ALT 25 09/21/2015: B Natriuretic Peptide 251.8 09/22/2015: BUN 6; Creatinine, Ser 0.87; Potassium 3.0; Sodium 144 09/23/2015: Hemoglobin 10.2; Platelets 186    Lipid Panel     Component Value Date/Time   CHOL 194 04/08/2014 1353   TRIG 219 (H) 04/08/2014 1353   HDL 40 (L) 04/08/2014 1353   CHOLHDL 4.9 04/08/2014 1353   VLDL 44 (H) 04/08/2014 1353   LDLCALC 110 (H) 04/08/2014 1353     Wt Readings from Last 3 Encounters:  10/28/15 266 lb 6.4 oz (120.8 kg)  10/22/15 270 lb (122.5 kg)  09/29/15  289 lb (131.1 kg)      Other studies Reviewed: Additional studies/ records that were reviewed today include: TTE 09/21/15  Review of the above records today demonstrates:  - Left ventricle: The cavity size was normal. Systolic function was   mildly reduced. The estimated ejection fraction was in the range   of 45% to 50%. Wall motion was normal; there were no regional   wall motion abnormalities. Features are consistent with a   pseudonormal left ventricular filling pattern, with concomitant   abnormal relaxation and increased filling pressure (grade 2   diastolic dysfunction). - Left atrium: The atrium was moderately dilated.   ASSESSMENT AND PLAN:  1.  Chronic combined systolic and diastolic HF: Currently on metoprolol but is not on an ACE inhibitor. We'll therefore switch her  from metoprolol to Toprol-XL 50 mg and add lisinopril 5 mg. She has had long-standing hypertension, which is likely the cause of her heart failure.  2. Hypertension: Well controlled today. We'll start her on lisinopril    Current medicines are reviewed at length with the patient today.   The patient does not have concerns regarding her medicines.  The following changes were made today:  Stop metoprolol, start Toprol XL and lisinopril   Labs/ tests ordered today include:  No orders of the defined types were placed in this encounter.    Disposition:   FU with [name] 3 months  Signed, Will Jorja Loa, MD  10/28/2015 2:20 PM     [facility] [address] [place] [address] [phone] (office) [phone] (fax)

## 2015-10-28 NOTE — Patient Instructions (Signed)
Medication Instructions:   Your physician has recommended you make the following change in your medication:   1) STOP Metoprolol tartrate (Lopressor)  2) START Toprol 50 mg once daily  3) START Lisinopril 5 mg once daily  --- If you need a refill on your cardiac medications before your next appointment, please call your pharmacy. ---  Labwork:  Your physician recommends that you return for lab work in: 1-2 weeks for BMET  Testing/Procedures:  None ordered  Follow-Up:  Your physician recommends that you schedule a follow-up appointment in: 3 months with Dr. Elberta Fortis.   Thank you for choosing [facility]!!   Dory Horn, RN [phone]   Any Other Special Instructions Will Be Listed Below (If Applicable).  Metoprolol extended-release tablets What is this medicine? METOPROLOL (me TOE proe lole) is a beta-blocker. Beta-blockers reduce the workload on the heart and help it to beat more regularly. This medicine is used to treat high blood pressure and to prevent chest pain. It is also used to after a heart attack and to prevent an additional heart attack from occurring. This medicine may be used for other purposes; ask your health care provider or pharmacist if you have questions. What should I tell my health care provider before I take this medicine? They need to know if you have any of these conditions: -diabetes -heart or vessel disease like slow heart rate, worsening heart failure, heart block, sick sinus syndrome or Raynaud's disease -kidney disease -liver disease -lung or breathing disease, like asthma or emphysema -pheochromocytoma -thyroid disease -an unusual or allergic reaction to metoprolol, other beta-blockers, medicines, foods, dyes, or preservatives -pregnant or trying to get pregnant -breast-feeding How should I use this medicine? Take this medicine by mouth with a glass of water. Follow the directions on the prescription label. Do not crush or chew.  Take this medicine with or immediately after meals. Take your doses at regular intervals. Do not take more medicine than directed. Do not stop taking this medicine suddenly. This could lead to serious heart-related effects. Talk to your pediatrician regarding the use of this medicine in children. While this drug may be prescribed for children as young as 6 years for selected conditions, precautions do apply. Overdosage: If you think you have taken too much of this medicine contact a poison control center or emergency room at once. NOTE: This medicine is only for you. Do not share this medicine with others. What if I miss a dose? If you miss a dose, take it as soon as you can. If it is almost time for your next dose, take only that dose. Do not take double or extra doses. What may interact with this medicine? This medicine may interact with the following medications: -certain medicines for blood pressure, heart disease, irregular heart beat -certain medicines for depression, like monoamine oxidase (MAO) inhibitors, fluoxetine, or paroxetine -clonidine -dobutamine -epinephrine -isoproterenol -reserpine This list may not describe all possible interactions. Give your health care provider a list of all the medicines, herbs, non-prescription drugs, or dietary supplements you use. Also tell them if you smoke, drink alcohol, or use illegal drugs. Some items may interact with your medicine. What should I watch for while using this medicine? Visit your doctor or health care professional for regular check ups. Contact your doctor right away if your symptoms worsen. Check your blood pressure and pulse rate regularly. Ask your health care professional what your blood pressure and pulse rate should be, and when you should contact them.  You may get drowsy or dizzy. Do not drive, use machinery, or do anything that needs mental alertness until you know how this medicine affects you. Do not sit or stand up quickly,  especially if you are an older patient. This reduces the risk of dizzy or fainting spells. Contact your doctor if these symptoms continue. Alcohol may interfere with the effect of this medicine. Avoid alcoholic drinks. What side effects may I notice from receiving this medicine? Side effects that you should report to your doctor or health care professional as soon as possible: -allergic reactions like skin rash, itching or hives -cold or numb hands or feet -depression -difficulty breathing -faint -fever with sore throat -irregular heartbeat, chest pain -rapid weight gain -swollen legs or ankles Side effects that usually do not require medical attention (report to your doctor or health care professional if they continue or are bothersome): -anxiety or nervousness -change in sex drive or performance -dry skin -headache -nightmares or trouble sleeping -short term memory loss -stomach upset or diarrhea -unusually tired This list may not describe all possible side effects. Call your doctor for medical advice about side effects. You may report side effects to FDA at 1-800-FDA-1088. Where should I keep my medicine? Keep out of the reach of children. Store at room temperature between 15 and 30 degrees C (59 and 86 degrees F). Throw away any unused medicine after the expiration date. NOTE: This sheet is a summary. It may not cover all possible information. If you have questions about this medicine, talk to your doctor, pharmacist, or health care provider.    2016, Elsevier/Gold Standard. (2012-08-31 14:41:37)    Lisinopril tablets What is this medicine? LISINOPRIL (lyse IN oh pril) is an ACE inhibitor. This medicine is used to treat high blood pressure and heart failure. It is also used to protect the heart immediately after a heart attack. This medicine may be used for other purposes; ask your health care provider or pharmacist if you have questions. What should I tell my health care  provider before I take this medicine? They need to know if you have any of these conditions: -diabetes -heart or blood vessel disease -kidney disease -low blood pressure -previous swelling of the tongue, face, or lips with difficulty breathing, difficulty swallowing, hoarseness, or tightening of the throat -an unusual or allergic reaction to lisinopril, other ACE inhibitors, insect venom, foods, dyes, or preservatives -pregnant or trying to get pregnant -breast-feeding How should I use this medicine? Take this medicine by mouth with a glass of water. Follow the directions on your prescription label. You may take this medicine with or without food. If it upsets your stomach, take it with food. Take your medicine at regular intervals. Do not take it more often than directed. Do not stop taking except on your doctor's advice. Talk to your pediatrician regarding the use of this medicine in children. Special care may be needed. While this drug may be prescribed for children as young as 22 years of age for selected conditions, precautions do apply. Overdosage: If you think you have taken too much of this medicine contact a poison control center or emergency room at once. NOTE: This medicine is only for you. Do not share this medicine with others. What if I miss a dose? If you miss a dose, take it as soon as you can. If it is almost time for your next dose, take only that dose. Do not take double or extra doses. What may interact with this  medicine? Do not take this medicine with any of the following medications: -hymenoptera venomThis medicines may also interact with the following medications: -aliskiren -angiotensin receptor blockers, like losartan or valsartan -certain medicines for diabetes -diuretics -everolimus -gold compounds -lithium -NSAIDs, medicines for pain and inflammation, like ibuprofen or naproxen -potassium salts or supplements -salt substitutes -sirolimus -temsirolimus This  list may not describe all possible interactions. Give your health care provider a list of all the medicines, herbs, non-prescription drugs, or dietary supplements you use. Also tell them if you smoke, drink alcohol, or use illegal drugs. Some items may interact with your medicine. What should I watch for while using this medicine? Visit your doctor or health care professional for regular check ups. Check your blood pressure as directed. Ask your doctor what your blood pressure should be, and when you should contact him or her. Do not treat yourself for coughs, colds, or pain while you are using this medicine without asking your doctor or health care professional for advice. Some ingredients may increase your blood pressure. Women should inform their doctor if they wish to become pregnant or think they might be pregnant. There is a potential for serious side effects to an unborn child. Talk to your health care professional or pharmacist for more information. Check with your doctor or health care professional if you get an attack of severe diarrhea, nausea and vomiting, or if you sweat a lot. The loss of too much body fluid can make it dangerous for you to take this medicine. You may get drowsy or dizzy. Do not drive, use machinery, or do anything that needs mental alertness until you know how this drug affects you. Do not stand or sit up quickly, especially if you are an older patient. This reduces the risk of dizzy or fainting spells. Alcohol can make you more drowsy and dizzy. Avoid alcoholic drinks. Avoid salt substitutes unless you are told otherwise by your doctor or health care professional. What side effects may I notice from receiving this medicine? Side effects that you should report to your doctor or health care professional as soon as possible: -allergic reactions like skin rash, itching or hives, swelling of the hands, feet, face, lips, throat, or tongue -breathing problems -signs and symptoms  of kidney injury like trouble passing urine or change in the amount of urine -signs and symptoms of increased potassium like muscle weakness; chest pain; or fast, irregular heartbeat -signs and symptoms of liver injury like dark yellow or brown urine; general ill feeling or flu-like symptoms; light-colored stools; loss of appetite; nausea; right upper belly pain; unusually weak or tired; yellowing of the eyes or skin -signs and symptoms of low blood pressure like dizziness; feeling faint or lightheaded, falls; unusually weak or tired -stomach pain with or without nausea and vomiting Side effects that usually do not require medical attention (report to your doctor or health care professional if they continue or are bothersome): -changes in taste -cough -dizziness -fever -headache -sensitivity to light This list may not describe all possible side effects. Call your doctor for medical advice about side effects. You may report side effects to FDA at 1-800-FDA-1088. Where should I keep my medicine? Keep out of the reach of children. Store at room temperature between 15 and 30 degrees C (59 and 86 degrees F). Protect from moisture. Keep container tightly closed. Throw away any unused medicine after the expiration date. NOTE: This sheet is a summary. It may not cover all possible information. If you have  questions about this medicine, talk to your doctor, pharmacist, or health care provider.    2016, Elsevier/Gold Standard. (2014-08-21 20:38:20)

## 2015-10-29 DIAGNOSIS — Z7901 Long term (current) use of anticoagulants: Secondary | ICD-10-CM | POA: Diagnosis not present

## 2015-10-29 DIAGNOSIS — Z87442 Personal history of urinary calculi: Secondary | ICD-10-CM | POA: Diagnosis not present

## 2015-10-29 DIAGNOSIS — Z960 Presence of urogenital implants: Secondary | ICD-10-CM | POA: Diagnosis not present

## 2015-10-29 DIAGNOSIS — Z5181 Encounter for therapeutic drug level monitoring: Secondary | ICD-10-CM | POA: Diagnosis not present

## 2015-10-29 DIAGNOSIS — I5040 Unspecified combined systolic (congestive) and diastolic (congestive) heart failure: Secondary | ICD-10-CM | POA: Diagnosis not present

## 2015-10-29 DIAGNOSIS — Z792 Long term (current) use of antibiotics: Secondary | ICD-10-CM | POA: Diagnosis not present

## 2015-10-29 DIAGNOSIS — I2699 Other pulmonary embolism without acute cor pulmonale: Secondary | ICD-10-CM | POA: Diagnosis not present

## 2015-10-29 DIAGNOSIS — Z48816 Encounter for surgical aftercare following surgery on the genitourinary system: Secondary | ICD-10-CM | POA: Diagnosis not present

## 2015-10-29 DIAGNOSIS — Z87891 Personal history of nicotine dependence: Secondary | ICD-10-CM | POA: Diagnosis not present

## 2015-10-29 DIAGNOSIS — I110 Hypertensive heart disease with heart failure: Secondary | ICD-10-CM | POA: Diagnosis not present

## 2015-10-29 DIAGNOSIS — Z86718 Personal history of other venous thrombosis and embolism: Secondary | ICD-10-CM | POA: Diagnosis not present

## 2015-10-29 DIAGNOSIS — E785 Hyperlipidemia, unspecified: Secondary | ICD-10-CM | POA: Diagnosis not present

## 2015-10-30 ENCOUNTER — Telehealth: Payer: Self-pay

## 2015-10-30 DIAGNOSIS — Z5181 Encounter for therapeutic drug level monitoring: Secondary | ICD-10-CM | POA: Diagnosis not present

## 2015-10-30 DIAGNOSIS — Z86718 Personal history of other venous thrombosis and embolism: Secondary | ICD-10-CM | POA: Diagnosis not present

## 2015-10-30 DIAGNOSIS — Z87442 Personal history of urinary calculi: Secondary | ICD-10-CM | POA: Diagnosis not present

## 2015-10-30 DIAGNOSIS — Z792 Long term (current) use of antibiotics: Secondary | ICD-10-CM | POA: Diagnosis not present

## 2015-10-30 DIAGNOSIS — Z7901 Long term (current) use of anticoagulants: Secondary | ICD-10-CM | POA: Diagnosis not present

## 2015-10-30 DIAGNOSIS — Z87891 Personal history of nicotine dependence: Secondary | ICD-10-CM | POA: Diagnosis not present

## 2015-10-30 DIAGNOSIS — Z48816 Encounter for surgical aftercare following surgery on the genitourinary system: Secondary | ICD-10-CM | POA: Diagnosis not present

## 2015-10-30 DIAGNOSIS — Z960 Presence of urogenital implants: Secondary | ICD-10-CM | POA: Diagnosis not present

## 2015-10-30 DIAGNOSIS — I2699 Other pulmonary embolism without acute cor pulmonale: Secondary | ICD-10-CM | POA: Diagnosis not present

## 2015-10-30 DIAGNOSIS — I5040 Unspecified combined systolic (congestive) and diastolic (congestive) heart failure: Secondary | ICD-10-CM | POA: Diagnosis not present

## 2015-10-30 DIAGNOSIS — I110 Hypertensive heart disease with heart failure: Secondary | ICD-10-CM | POA: Diagnosis not present

## 2015-10-30 DIAGNOSIS — E785 Hyperlipidemia, unspecified: Secondary | ICD-10-CM | POA: Diagnosis not present

## 2015-10-30 NOTE — Telephone Encounter (Signed)
Yes, I agree. Thank you!

## 2015-10-30 NOTE — Telephone Encounter (Signed)
Charisse from [facility], requesting VO. Please call back.

## 2015-10-30 NOTE — Telephone Encounter (Signed)
PT calls ask for approval of VO- 1x week for 2 weeks, 2x week for 4 weeks, VO approved, do you agree

## 2015-11-02 ENCOUNTER — Telehealth: Payer: Self-pay

## 2015-11-02 DIAGNOSIS — Z87891 Personal history of nicotine dependence: Secondary | ICD-10-CM | POA: Diagnosis not present

## 2015-11-02 DIAGNOSIS — Z5181 Encounter for therapeutic drug level monitoring: Secondary | ICD-10-CM | POA: Diagnosis not present

## 2015-11-02 DIAGNOSIS — Z87442 Personal history of urinary calculi: Secondary | ICD-10-CM | POA: Diagnosis not present

## 2015-11-02 DIAGNOSIS — I2699 Other pulmonary embolism without acute cor pulmonale: Secondary | ICD-10-CM | POA: Diagnosis not present

## 2015-11-02 DIAGNOSIS — Z792 Long term (current) use of antibiotics: Secondary | ICD-10-CM | POA: Diagnosis not present

## 2015-11-02 DIAGNOSIS — I5040 Unspecified combined systolic (congestive) and diastolic (congestive) heart failure: Secondary | ICD-10-CM | POA: Diagnosis not present

## 2015-11-02 DIAGNOSIS — I110 Hypertensive heart disease with heart failure: Secondary | ICD-10-CM | POA: Diagnosis not present

## 2015-11-02 DIAGNOSIS — Z7901 Long term (current) use of anticoagulants: Secondary | ICD-10-CM | POA: Diagnosis not present

## 2015-11-02 DIAGNOSIS — Z48816 Encounter for surgical aftercare following surgery on the genitourinary system: Secondary | ICD-10-CM | POA: Diagnosis not present

## 2015-11-02 DIAGNOSIS — Z86718 Personal history of other venous thrombosis and embolism: Secondary | ICD-10-CM | POA: Diagnosis not present

## 2015-11-02 DIAGNOSIS — Z960 Presence of urogenital implants: Secondary | ICD-10-CM | POA: Diagnosis not present

## 2015-11-02 DIAGNOSIS — E785 Hyperlipidemia, unspecified: Secondary | ICD-10-CM | POA: Diagnosis not present

## 2015-11-02 NOTE — Telephone Encounter (Deleted)
Caroline [name]

## 2015-11-02 NOTE — Telephone Encounter (Signed)
That INR is therapeutic for her. Goal INR for her should be 2.0-3.0. I think ok to keep doing Coumadin 3 mg daily and have her recheck INR next week.

## 2015-11-02 NOTE — Telephone Encounter (Signed)
Jim from [facility] requesting VO. Please call back.

## 2015-11-02 NOTE — Telephone Encounter (Signed)
Needs to speak with a nurse regarding INR result.

## 2015-11-02 NOTE — Telephone Encounter (Signed)
[facility] OT calls for verbal approval of OT for 3 weeks, given, do you agree?

## 2015-11-02 NOTE — Telephone Encounter (Signed)
Caroline [name], [facility] calls and reports todays INR is 2.8 and she is currently taking 3mg  coumadin daily [name]- [phone]

## 2015-11-03 NOTE — Telephone Encounter (Signed)
Called valerie- [facility]- gave order for pt to continue 3mg  daily and check INR again in 1 week from last check, she repeated back, she will call w/ results

## 2015-11-04 DIAGNOSIS — I110 Hypertensive heart disease with heart failure: Secondary | ICD-10-CM | POA: Diagnosis not present

## 2015-11-04 DIAGNOSIS — Z86718 Personal history of other venous thrombosis and embolism: Secondary | ICD-10-CM | POA: Diagnosis not present

## 2015-11-04 DIAGNOSIS — Z5181 Encounter for therapeutic drug level monitoring: Secondary | ICD-10-CM | POA: Diagnosis not present

## 2015-11-04 DIAGNOSIS — Z792 Long term (current) use of antibiotics: Secondary | ICD-10-CM | POA: Diagnosis not present

## 2015-11-04 DIAGNOSIS — I2699 Other pulmonary embolism without acute cor pulmonale: Secondary | ICD-10-CM | POA: Diagnosis not present

## 2015-11-04 DIAGNOSIS — Z7901 Long term (current) use of anticoagulants: Secondary | ICD-10-CM | POA: Diagnosis not present

## 2015-11-04 DIAGNOSIS — Z48816 Encounter for surgical aftercare following surgery on the genitourinary system: Secondary | ICD-10-CM | POA: Diagnosis not present

## 2015-11-04 DIAGNOSIS — Z87442 Personal history of urinary calculi: Secondary | ICD-10-CM | POA: Diagnosis not present

## 2015-11-04 DIAGNOSIS — E785 Hyperlipidemia, unspecified: Secondary | ICD-10-CM | POA: Diagnosis not present

## 2015-11-04 DIAGNOSIS — Z87891 Personal history of nicotine dependence: Secondary | ICD-10-CM | POA: Diagnosis not present

## 2015-11-04 DIAGNOSIS — Z960 Presence of urogenital implants: Secondary | ICD-10-CM | POA: Diagnosis not present

## 2015-11-04 DIAGNOSIS — I5040 Unspecified combined systolic (congestive) and diastolic (congestive) heart failure: Secondary | ICD-10-CM | POA: Diagnosis not present

## 2015-11-05 ENCOUNTER — Ambulatory Visit: Payer: Medicare Other

## 2015-11-06 DIAGNOSIS — Z792 Long term (current) use of antibiotics: Secondary | ICD-10-CM | POA: Diagnosis not present

## 2015-11-06 DIAGNOSIS — Z48816 Encounter for surgical aftercare following surgery on the genitourinary system: Secondary | ICD-10-CM | POA: Diagnosis not present

## 2015-11-06 DIAGNOSIS — E785 Hyperlipidemia, unspecified: Secondary | ICD-10-CM | POA: Diagnosis not present

## 2015-11-06 DIAGNOSIS — Z7901 Long term (current) use of anticoagulants: Secondary | ICD-10-CM | POA: Diagnosis not present

## 2015-11-06 DIAGNOSIS — Z86718 Personal history of other venous thrombosis and embolism: Secondary | ICD-10-CM | POA: Diagnosis not present

## 2015-11-06 DIAGNOSIS — I110 Hypertensive heart disease with heart failure: Secondary | ICD-10-CM | POA: Diagnosis not present

## 2015-11-06 DIAGNOSIS — Z87891 Personal history of nicotine dependence: Secondary | ICD-10-CM | POA: Diagnosis not present

## 2015-11-06 DIAGNOSIS — Z960 Presence of urogenital implants: Secondary | ICD-10-CM | POA: Diagnosis not present

## 2015-11-06 DIAGNOSIS — Z87442 Personal history of urinary calculi: Secondary | ICD-10-CM | POA: Diagnosis not present

## 2015-11-06 DIAGNOSIS — I2699 Other pulmonary embolism without acute cor pulmonale: Secondary | ICD-10-CM | POA: Diagnosis not present

## 2015-11-06 DIAGNOSIS — I5040 Unspecified combined systolic (congestive) and diastolic (congestive) heart failure: Secondary | ICD-10-CM | POA: Diagnosis not present

## 2015-11-06 DIAGNOSIS — Z5181 Encounter for therapeutic drug level monitoring: Secondary | ICD-10-CM | POA: Diagnosis not present

## 2015-11-09 ENCOUNTER — Ambulatory Visit (INDEPENDENT_AMBULATORY_CARE_PROVIDER_SITE_OTHER): Payer: Medicare Other | Admitting: Internal Medicine

## 2015-11-09 VITALS — BP 107/52 | HR 99 | Temp 97.9°F | Ht 63.0 in | Wt 261.7 lb

## 2015-11-09 DIAGNOSIS — Z01818 Encounter for other preprocedural examination: Secondary | ICD-10-CM | POA: Insufficient documentation

## 2015-11-09 DIAGNOSIS — Z86711 Personal history of pulmonary embolism: Secondary | ICD-10-CM | POA: Diagnosis not present

## 2015-11-09 DIAGNOSIS — Z23 Encounter for immunization: Secondary | ICD-10-CM

## 2015-11-09 DIAGNOSIS — I5042 Chronic combined systolic (congestive) and diastolic (congestive) heart failure: Secondary | ICD-10-CM | POA: Diagnosis not present

## 2015-11-09 DIAGNOSIS — Z86718 Personal history of other venous thrombosis and embolism: Secondary | ICD-10-CM

## 2015-11-09 DIAGNOSIS — Z7901 Long term (current) use of anticoagulants: Secondary | ICD-10-CM

## 2015-11-09 DIAGNOSIS — I110 Hypertensive heart disease with heart failure: Secondary | ICD-10-CM | POA: Diagnosis not present

## 2015-11-09 NOTE — Progress Notes (Signed)
   CC: Patient is requesting preop clearance for a urologic surgery.  HPI:  [name] is a 70 y.o. female with a past medical history of conditions listed below presenting to the clinic requesting preop clearance for a urologic surgery. Please see problem based charting for the status of the patient's current and chronic medical conditions.   Past Medical History:  Diagnosis Date  . Asthma   . Diabetes mellitus   . DVT, lower extremity, recurrent (HCC)    Patient had unprovoked PE on 2002 and DVT in right lower extremety 2008.  [name] Hypertension   . PE (pulmonary embolism)    Patient had unprovoked PE on 2002  . Vertigo     Review of Systems: Pertinent positives mentioned in HPI. Remainder of all ROS negative.   Physical Exam:  Vitals:   11/09/15 1045  BP: (!) 107/52  Pulse: 99  Temp: 97.9 F (36.6 C)  TempSrc: Oral  SpO2: 99%  Weight: 261 lb 11.2 oz (118.7 kg)  Height: 5\' 3"  (1.6 m)   Physical Exam  Constitutional: She is oriented to person, place, and time. She appears well-developed and well-nourished. No distress.  HENT:  Head: Normocephalic and atraumatic.  Mouth/Throat: Oropharynx is clear and moist.  Eyes: EOM are normal.  Neck: Neck supple. No tracheal deviation present.  Cardiovascular: Normal rate, regular rhythm and intact distal pulses.   Pulmonary/Chest: Effort normal and breath sounds normal. No respiratory distress.  Abdominal: Soft. Bowel sounds are normal. She exhibits no distension. There is no tenderness.  Neurological: She is alert and oriented to person, place, and time.  Skin: Skin is warm and dry.    Assessment & Plan:   See Encounters Tab for problem based charting.  Patient discussed with Dr. Josem Kaufmann

## 2015-11-09 NOTE — Patient Instructions (Addendum)
Caroline [name] it was nice meeting you today.  -Our office will schedule a heart stress test for you.  -Return to the clinic in 1 month for a follow-up.

## 2015-11-10 ENCOUNTER — Telehealth: Payer: Self-pay

## 2015-11-10 ENCOUNTER — Telehealth: Payer: Self-pay | Admitting: *Deleted

## 2015-11-10 DIAGNOSIS — Z48816 Encounter for surgical aftercare following surgery on the genitourinary system: Secondary | ICD-10-CM | POA: Diagnosis not present

## 2015-11-10 DIAGNOSIS — I5040 Unspecified combined systolic (congestive) and diastolic (congestive) heart failure: Secondary | ICD-10-CM | POA: Diagnosis not present

## 2015-11-10 DIAGNOSIS — Z87442 Personal history of urinary calculi: Secondary | ICD-10-CM | POA: Diagnosis not present

## 2015-11-10 DIAGNOSIS — Z87891 Personal history of nicotine dependence: Secondary | ICD-10-CM | POA: Diagnosis not present

## 2015-11-10 DIAGNOSIS — Z960 Presence of urogenital implants: Secondary | ICD-10-CM | POA: Diagnosis not present

## 2015-11-10 DIAGNOSIS — Z792 Long term (current) use of antibiotics: Secondary | ICD-10-CM | POA: Diagnosis not present

## 2015-11-10 DIAGNOSIS — Z86718 Personal history of other venous thrombosis and embolism: Secondary | ICD-10-CM | POA: Diagnosis not present

## 2015-11-10 DIAGNOSIS — Z5181 Encounter for therapeutic drug level monitoring: Secondary | ICD-10-CM | POA: Diagnosis not present

## 2015-11-10 DIAGNOSIS — Z7901 Long term (current) use of anticoagulants: Secondary | ICD-10-CM | POA: Diagnosis not present

## 2015-11-10 DIAGNOSIS — E785 Hyperlipidemia, unspecified: Secondary | ICD-10-CM | POA: Diagnosis not present

## 2015-11-10 DIAGNOSIS — I110 Hypertensive heart disease with heart failure: Secondary | ICD-10-CM | POA: Diagnosis not present

## 2015-11-10 DIAGNOSIS — I2699 Other pulmonary embolism without acute cor pulmonale: Secondary | ICD-10-CM | POA: Diagnosis not present

## 2015-11-10 NOTE — Telephone Encounter (Signed)
Anticoagulation Management Caroline [name] is a 70 y.o. female who reports to the clinic for monitoring of warfarin treatment.    Indication: DVTCHA2DS2VASC score 6 Duration: indefinite  Anticoagulation Clinic Visit History: Patient does not report signs/symptoms of bleeding or thromboembolism. Other recent changes: Previous modest increase in Vitamin K via diet (1-2 servings) and one cup of green tea which she reports "she spit out" on 10/23/15. Unlikely to contribute to increased INR on 11/10/15. No recent changes to medications and/or lifestyle.  ASSESSMENT Recent Results: The most recent result is correlated with 18  mg per week:   INR of 4.1 (per nurse Vikki Ports at [facility])   INR today: Supratherapeutic  PLAN Weekly dose was decreased by 4.5 mg to 13.5 mg per week. Patient was instructed to hold her dose of 1.5 mg today and 3 mg tomorrow on 11/11/15. Patient instructed to take 3 mg dose on 11/12/15 and has scheduled follow up with Dr. Marzetta Board for coumadin clinic and INR monitoring. Weekly dose will be reassessed at that time.   There are no Patient Instructions on file for this visit. Patient advised to contact clinic or seek medical attention if signs/symptoms of bleeding or thromboembolism occur.  Patient verbalized understanding by repeating back information and was advised to contact me if further medication-related questions arise. Patient was also provided an information handout.  Follow-up No Follow-up on file.  Lorenso Courier  15 minutes spent on telephone with the patient during the encounter. 50% of time spent on education. 50% of time was spent on assessing reason for supra-therapeutic INR.

## 2015-11-10 NOTE — Assessment & Plan Note (Addendum)
History of present illness Patient is here to get preop clearance for a urologic surgery. She was hospitalized from 09/16/2015 to 09/23/2015 for Klebsiella pneumonia sepsis secondary to right renal stone and hydronephrosis. Patient underwent cystoscopy, right retrograde ureterogram and insertion of right ureteral stent during her hospitalization. She is currently being followed by urology.   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 CHA2DS2-VASc score 6. Patient has intermediate clinical predictors with poor functional capacity; needs noninvasive testing done prior surgery. Per revised cardiac risk index calculator, patient has a 6.6% risk of a major cardiac event during surgery (class III risk).   Plan -Referral for Myoview stress test. Testing needs to be done prior to surgery.  -Recommend holding Coumadin 5 days before surgery. If surgery is delayed, admit patient for IV heparin bridge.

## 2015-11-10 NOTE — Telephone Encounter (Signed)
Received call from [facility] with [facility] (Ph# [phone])with the following results: pt's INR 4.1 and  PT 49.4 -Per Vikki Ports pt's is taking warfarin 3mg -last dose taken last night.  Will send to [facility] for review.Criss Alvine, Darlene Cassady10/31/20172:52 PM    (Of note, the nurse would liked to be called back with instructions also)

## 2015-11-11 ENCOUNTER — Other Ambulatory Visit (INDEPENDENT_AMBULATORY_CARE_PROVIDER_SITE_OTHER): Payer: Medicare Other | Admitting: Pharmacist

## 2015-11-11 DIAGNOSIS — I82409 Acute embolism and thrombosis of unspecified deep veins of unspecified lower extremity: Secondary | ICD-10-CM | POA: Diagnosis not present

## 2015-11-11 LAB — POCT INR: INR: 3.49

## 2015-11-11 NOTE — Progress Notes (Addendum)
INR check per Home Health Nurse.  Follow up plan: hold warfarin dose x 1 more today (1 dose held yesterday), take 1 dose tomorrow 11/12/15 (3 mg) patient scheduled for a clinic appointment 11/13/15

## 2015-11-12 ENCOUNTER — Telehealth: Payer: Self-pay

## 2015-11-12 NOTE — Telephone Encounter (Signed)
I see her INR was 3.5 yesterday (11/1). I would have her hold her Coumadin dose today if she hasn't already taken it. If she has, then would have her hold tomorrow's dose. She will need repeat INR on Monday if possible.

## 2015-11-12 NOTE — Telephone Encounter (Signed)
Car[name] requesting to speak with a nurse regarding INR result.

## 2015-11-12 NOTE — Telephone Encounter (Signed)
INR 4.1  Done 10/31

## 2015-11-13 ENCOUNTER — Other Ambulatory Visit: Payer: Medicare Other | Admitting: *Deleted

## 2015-11-13 ENCOUNTER — Ambulatory Visit (INDEPENDENT_AMBULATORY_CARE_PROVIDER_SITE_OTHER): Payer: Medicare Other | Admitting: Pharmacist

## 2015-11-13 DIAGNOSIS — I824Y1 Acute embolism and thrombosis of unspecified deep veins of right proximal lower extremity: Secondary | ICD-10-CM

## 2015-11-13 DIAGNOSIS — Z79899 Other long term (current) drug therapy: Secondary | ICD-10-CM

## 2015-11-13 DIAGNOSIS — I825Y1 Chronic embolism and thrombosis of unspecified deep veins of right proximal lower extremity: Secondary | ICD-10-CM | POA: Diagnosis not present

## 2015-11-13 DIAGNOSIS — Z7901 Long term (current) use of anticoagulants: Secondary | ICD-10-CM | POA: Diagnosis not present

## 2015-11-13 LAB — BASIC METABOLIC PANEL
BUN: 20 mg/dL (ref 7–25)
CALCIUM: 9.5 mg/dL (ref 8.6–10.4)
CO2: 20 mmol/L (ref 20–31)
CREATININE: 1.22 mg/dL — AB (ref 0.50–0.99)
Chloride: 102 mmol/L (ref 98–110)
GLUCOSE: 111 mg/dL — AB (ref 65–99)
Potassium: 4.9 mmol/L (ref 3.5–5.3)
Sodium: 135 mmol/L (ref 135–146)

## 2015-11-13 LAB — POCT INR: INR: 3.7

## 2015-11-13 NOTE — Progress Notes (Signed)
Anticoagulation Management Caroline [name] is a 70 y.o. female who reports to the clinic for monitoring of warfarin treatment.    Indication: DVThistory, recurrent Duration: indefinite  Anticoagulation Clinic Visit History: Patient does not report signs/symptoms of bleeding or thromboembolism. She does report reduced intake of dietary vitamin K due to recent illness (kidney stones).  Anticoagulation Episode Summary    Current INR goal:     TTR:   97.1 % (9.4 mo)  Next INR check:   11/16/2015  INR from last check:   3.7 (11/13/2015)  Weekly max dose:     Target end date:     INR check location:     Preferred lab:     Send INR reminders to:        Comments:          ASSESSMENT Recent Results: The most recent result is correlated with 13.5 mg per week: Lab Results  Component Value Date   INR 3.7 11/13/2015   INR 3.49 11/11/2015   INR 2.14 09/23/2015   Anticoagulation Dosing: INR as of 11/13/2015 and Previous Dosing Information    INR Dt INR Goal [facility] Sun Mon Tue Wed Thu Fri Sat   11/13/2015 3.7 - 13.5 mg 3 mg 1.5 mg 0 mg 0 mg 3 mg 3 mg 3 mg   Patient deviated from recommended dosing.       Anticoagulation Dose Instructions as of 11/13/2015      Total Sun Mon Tue Wed Thu Fri Sat   New Dose 10.5 mg 1.5 mg 3 mg 0 mg 0 mg 3 mg 1.5 mg 1.5 mg     (3 mg x 0.5)  (3 mg x 1)  -  -  (3 mg x 1)  (3 mg x 0.5)  (3 mg x 0.5)                           INR today: Supratherapeutic  PLAN Weekly dose was decreased to 10.5 mg per week, currently re-establishing a stable weekly dose for patient considering recent changes. Provided education on moderate and consistent dietary vitamin K intake.  Patient Instructions  Patient educated about medication as defined in this encounter and verbalized understanding by repeating back instructions provided.   Patient advised to contact clinic or seek medical attention if signs/symptoms of bleeding or thromboembolism occur.  Patient  verbalized understanding by repeating back information and was advised to contact me if further medication-related questions arise. Patient was also provided an information handout.  Follow-up 3 days 11/16/15  Kim,Jennifer J  15 minutes spent face-to-face with the patient during the encounter. 50% of time spent on education. 50% of time was spent on assessment and plan.

## 2015-11-13 NOTE — Progress Notes (Signed)
INTERNAL MEDICINE TEACHING ATTENDING ADDENDUM - Earl Lagos M.D  Duration- indefinbite, Indication- PE, DVT, INR- supratherapeutic. Agree with pharmacy recommendations as outlined in their note.

## 2015-11-13 NOTE — Patient Instructions (Signed)
Patient educated about medication as defined in this encounter and verbalized understanding by repeating back instructions provided.   

## 2015-11-14 NOTE — Progress Notes (Signed)
Case discussed with Dr. Rathore at the time of the visit.  We reviewed the resident's history and exam and pertinent patient test results.  I agree with the assessment, diagnosis, and plan of care documented in the resident's note. 

## 2015-11-16 ENCOUNTER — Encounter: Payer: Self-pay | Admitting: Internal Medicine

## 2015-11-16 ENCOUNTER — Other Ambulatory Visit: Payer: Self-pay | Admitting: Internal Medicine

## 2015-11-16 ENCOUNTER — Other Ambulatory Visit: Payer: Self-pay

## 2015-11-16 ENCOUNTER — Telehealth: Payer: Self-pay | Admitting: Pulmonary Disease

## 2015-11-16 ENCOUNTER — Encounter: Payer: Self-pay | Admitting: Cardiology

## 2015-11-16 ENCOUNTER — Telehealth: Payer: Self-pay | Admitting: *Deleted

## 2015-11-16 DIAGNOSIS — Z48816 Encounter for surgical aftercare following surgery on the genitourinary system: Secondary | ICD-10-CM | POA: Diagnosis not present

## 2015-11-16 DIAGNOSIS — Z792 Long term (current) use of antibiotics: Secondary | ICD-10-CM | POA: Diagnosis not present

## 2015-11-16 DIAGNOSIS — I5040 Unspecified combined systolic (congestive) and diastolic (congestive) heart failure: Secondary | ICD-10-CM | POA: Diagnosis not present

## 2015-11-16 DIAGNOSIS — I110 Hypertensive heart disease with heart failure: Secondary | ICD-10-CM | POA: Diagnosis not present

## 2015-11-16 DIAGNOSIS — E785 Hyperlipidemia, unspecified: Secondary | ICD-10-CM | POA: Diagnosis not present

## 2015-11-16 DIAGNOSIS — I2699 Other pulmonary embolism without acute cor pulmonale: Secondary | ICD-10-CM | POA: Diagnosis not present

## 2015-11-16 DIAGNOSIS — Z960 Presence of urogenital implants: Secondary | ICD-10-CM | POA: Diagnosis not present

## 2015-11-16 DIAGNOSIS — Z87891 Personal history of nicotine dependence: Secondary | ICD-10-CM | POA: Diagnosis not present

## 2015-11-16 DIAGNOSIS — Z86718 Personal history of other venous thrombosis and embolism: Secondary | ICD-10-CM | POA: Diagnosis not present

## 2015-11-16 DIAGNOSIS — Z5181 Encounter for therapeutic drug level monitoring: Secondary | ICD-10-CM | POA: Diagnosis not present

## 2015-11-16 DIAGNOSIS — Z7901 Long term (current) use of anticoagulants: Secondary | ICD-10-CM | POA: Diagnosis not present

## 2015-11-16 DIAGNOSIS — Z87442 Personal history of urinary calculi: Secondary | ICD-10-CM | POA: Diagnosis not present

## 2015-11-16 NOTE — Telephone Encounter (Signed)
This encounter was created in error - please disregard.

## 2015-11-16 NOTE — Telephone Encounter (Signed)
Per dr Selena Batten pt is to take 1.5mg  warfarin starting today and have a next PT/INR in 1 week, called to [facility]

## 2015-11-16 NOTE — Telephone Encounter (Signed)
Finally spoke to [facility] today, she is per VO seeing pt today and drawing INR, she will call results to triage, will send to dr's kim and rivet

## 2015-11-16 NOTE — Telephone Encounter (Signed)
Vikki Ports, [facility] calls w/ pt and INR done today at appr 1400: PT = 30.3 INR = 2.5 Sent to dr's kim and rivet

## 2015-11-16 NOTE — Telephone Encounter (Signed)
New message    Patient calling - test results.

## 2015-11-16 NOTE — Telephone Encounter (Signed)
   Reason for call:   I received a call from [facility]) on behalf of [name] at 6 PM wondering about coumadin dosing.   Pertinent Data:   She took 3mg  on Sunday, 3mg  on Monday, Held Tuesday through Thursday. She took 1.5mg  Friday through Sunday. INR was 2.5 today which is down from 3.7 on 11/3.  Goal is 2-3   Assessment / Plan / Recommendations:   I instructed her to take 1.5mg  today. Will ask Caroline [name] to touch base with her tomorrow for further dosing instructions from Dr. Selena [name].  As always, pt is advised that if symptoms worsen or new symptoms arise, they should go to an urgent care facility or to to ER for further evaluation.   Caroline Paula, MD   11/16/2015, 6:08 PM

## 2015-11-17 ENCOUNTER — Telehealth: Payer: Self-pay | Admitting: Pharmacist

## 2015-11-17 DIAGNOSIS — E785 Hyperlipidemia, unspecified: Secondary | ICD-10-CM | POA: Diagnosis not present

## 2015-11-17 DIAGNOSIS — Z86718 Personal history of other venous thrombosis and embolism: Secondary | ICD-10-CM | POA: Diagnosis not present

## 2015-11-17 DIAGNOSIS — Z5181 Encounter for therapeutic drug level monitoring: Secondary | ICD-10-CM | POA: Diagnosis not present

## 2015-11-17 DIAGNOSIS — Z48816 Encounter for surgical aftercare following surgery on the genitourinary system: Secondary | ICD-10-CM | POA: Diagnosis not present

## 2015-11-17 DIAGNOSIS — Z7901 Long term (current) use of anticoagulants: Secondary | ICD-10-CM | POA: Diagnosis not present

## 2015-11-17 DIAGNOSIS — Z87891 Personal history of nicotine dependence: Secondary | ICD-10-CM | POA: Diagnosis not present

## 2015-11-17 DIAGNOSIS — I5040 Unspecified combined systolic (congestive) and diastolic (congestive) heart failure: Secondary | ICD-10-CM | POA: Diagnosis not present

## 2015-11-17 DIAGNOSIS — I110 Hypertensive heart disease with heart failure: Secondary | ICD-10-CM | POA: Diagnosis not present

## 2015-11-17 DIAGNOSIS — Z960 Presence of urogenital implants: Secondary | ICD-10-CM | POA: Diagnosis not present

## 2015-11-17 DIAGNOSIS — I2699 Other pulmonary embolism without acute cor pulmonale: Secondary | ICD-10-CM | POA: Diagnosis not present

## 2015-11-17 DIAGNOSIS — Z87442 Personal history of urinary calculi: Secondary | ICD-10-CM | POA: Diagnosis not present

## 2015-11-17 DIAGNOSIS — Z792 Long term (current) use of antibiotics: Secondary | ICD-10-CM | POA: Diagnosis not present

## 2015-11-17 NOTE — Telephone Encounter (Signed)
Was called by [facility] RN at [phone] reporting INR = 2.5. Based upon regimen patient was able to provide, will recomend 3mg  on MWF; 1.5mg  all other days. INR to be recollected 13-NOV-17. Spoke with the patient and provided this dosing regimen which she was able to repeat back to me. She reports no bleeding and is currently not on antibiotics.

## 2015-11-18 DIAGNOSIS — Z7901 Long term (current) use of anticoagulants: Secondary | ICD-10-CM | POA: Diagnosis not present

## 2015-11-18 DIAGNOSIS — Z86718 Personal history of other venous thrombosis and embolism: Secondary | ICD-10-CM | POA: Diagnosis not present

## 2015-11-18 DIAGNOSIS — Z5181 Encounter for therapeutic drug level monitoring: Secondary | ICD-10-CM | POA: Diagnosis not present

## 2015-11-18 DIAGNOSIS — I110 Hypertensive heart disease with heart failure: Secondary | ICD-10-CM | POA: Diagnosis not present

## 2015-11-18 DIAGNOSIS — I2699 Other pulmonary embolism without acute cor pulmonale: Secondary | ICD-10-CM | POA: Diagnosis not present

## 2015-11-18 DIAGNOSIS — Z87891 Personal history of nicotine dependence: Secondary | ICD-10-CM | POA: Diagnosis not present

## 2015-11-18 DIAGNOSIS — Z48816 Encounter for surgical aftercare following surgery on the genitourinary system: Secondary | ICD-10-CM | POA: Diagnosis not present

## 2015-11-18 DIAGNOSIS — Z87442 Personal history of urinary calculi: Secondary | ICD-10-CM | POA: Diagnosis not present

## 2015-11-18 DIAGNOSIS — E785 Hyperlipidemia, unspecified: Secondary | ICD-10-CM | POA: Diagnosis not present

## 2015-11-18 DIAGNOSIS — I5040 Unspecified combined systolic (congestive) and diastolic (congestive) heart failure: Secondary | ICD-10-CM | POA: Diagnosis not present

## 2015-11-18 DIAGNOSIS — Z960 Presence of urogenital implants: Secondary | ICD-10-CM | POA: Diagnosis not present

## 2015-11-18 DIAGNOSIS — Z792 Long term (current) use of antibiotics: Secondary | ICD-10-CM | POA: Diagnosis not present

## 2015-11-19 ENCOUNTER — Telehealth: Payer: Self-pay

## 2015-11-19 ENCOUNTER — Other Ambulatory Visit: Payer: Self-pay | Admitting: *Deleted

## 2015-11-19 ENCOUNTER — Other Ambulatory Visit: Payer: Self-pay | Admitting: Urology

## 2015-11-19 DIAGNOSIS — R42 Dizziness and giddiness: Secondary | ICD-10-CM

## 2015-11-19 MED ORDER — MECLIZINE HCL 25 MG PO TABS: 25.0000 mg | ORAL_TABLET | Freq: Three times a day (TID) | ORAL | 1 refills | Status: DC | PRN

## 2015-11-19 NOTE — Telephone Encounter (Signed)
Tam from [facility] needs to speak with a nurse regarding pt. Please call [phone].

## 2015-11-19 NOTE — Telephone Encounter (Signed)
Pam calls from the urology ctr, pt will have her surgery on 12/10/2016, please see dr rivet's letter to dr Marlou Porch

## 2015-11-20 ENCOUNTER — Telehealth: Payer: Self-pay

## 2015-11-20 ENCOUNTER — Other Ambulatory Visit: Payer: Self-pay | Admitting: Internal Medicine

## 2015-11-20 ENCOUNTER — Other Ambulatory Visit: Payer: Self-pay | Admitting: *Deleted

## 2015-11-20 DIAGNOSIS — Z7901 Long term (current) use of anticoagulants: Secondary | ICD-10-CM | POA: Diagnosis not present

## 2015-11-20 DIAGNOSIS — Z86718 Personal history of other venous thrombosis and embolism: Secondary | ICD-10-CM | POA: Diagnosis not present

## 2015-11-20 DIAGNOSIS — Z48816 Encounter for surgical aftercare following surgery on the genitourinary system: Secondary | ICD-10-CM | POA: Diagnosis not present

## 2015-11-20 DIAGNOSIS — I110 Hypertensive heart disease with heart failure: Secondary | ICD-10-CM | POA: Diagnosis not present

## 2015-11-20 DIAGNOSIS — Z960 Presence of urogenital implants: Secondary | ICD-10-CM | POA: Diagnosis not present

## 2015-11-20 DIAGNOSIS — I82409 Acute embolism and thrombosis of unspecified deep veins of unspecified lower extremity: Secondary | ICD-10-CM

## 2015-11-20 DIAGNOSIS — Z87891 Personal history of nicotine dependence: Secondary | ICD-10-CM | POA: Diagnosis not present

## 2015-11-20 DIAGNOSIS — E785 Hyperlipidemia, unspecified: Secondary | ICD-10-CM | POA: Diagnosis not present

## 2015-11-20 DIAGNOSIS — I2699 Other pulmonary embolism without acute cor pulmonale: Secondary | ICD-10-CM | POA: Diagnosis not present

## 2015-11-20 DIAGNOSIS — Z5181 Encounter for therapeutic drug level monitoring: Secondary | ICD-10-CM | POA: Diagnosis not present

## 2015-11-20 DIAGNOSIS — Z792 Long term (current) use of antibiotics: Secondary | ICD-10-CM | POA: Diagnosis not present

## 2015-11-20 DIAGNOSIS — Z87442 Personal history of urinary calculi: Secondary | ICD-10-CM | POA: Diagnosis not present

## 2015-11-20 DIAGNOSIS — I5040 Unspecified combined systolic (congestive) and diastolic (congestive) heart failure: Secondary | ICD-10-CM | POA: Diagnosis not present

## 2015-11-20 MED ORDER — FUROSEMIDE 40 MG PO TABS: 40.0000 mg | ORAL_TABLET | ORAL | 1 refills | Status: DC

## 2015-11-20 MED ORDER — PANTOPRAZOLE SODIUM 40 MG PO TBEC: 40.0000 mg | DELAYED_RELEASE_TABLET | Freq: Every day | ORAL | 1 refills | Status: DC

## 2015-11-20 MED ORDER — WARFARIN SODIUM 3 MG PO TABS: 1.5000 mg | ORAL_TABLET | Freq: Every day | ORAL | 5 refills | Status: DC

## 2015-11-20 MED ORDER — WARFARIN SODIUM 3 MG PO TABS: 3.0000 mg | ORAL_TABLET | Freq: Every day | ORAL | 5 refills | Status: DC

## 2015-11-20 MED ORDER — ATORVASTATIN CALCIUM 10 MG PO TABS: 10.0000 mg | ORAL_TABLET | Freq: Every day | ORAL | 1 refills | Status: DC

## 2015-11-20 NOTE — Progress Notes (Signed)
Pt is being scheduled for preop appt; please place surgical orders in epic. Thanks.  

## 2015-11-20 NOTE — Telephone Encounter (Signed)
Talked to Dr Beckie Salts; new Warfarin rx sent.

## 2015-11-20 NOTE — Telephone Encounter (Signed)
Alex from [facility] needs to speak with a nurse regarding warfarin.

## 2015-11-22 NOTE — Telephone Encounter (Signed)
Patient was contacted with Frank Tillman, PharmD candidate. I agree with the assessment and plan of care documented.  

## 2015-11-23 DIAGNOSIS — Z86718 Personal history of other venous thrombosis and embolism: Secondary | ICD-10-CM | POA: Diagnosis not present

## 2015-11-23 DIAGNOSIS — E785 Hyperlipidemia, unspecified: Secondary | ICD-10-CM | POA: Diagnosis not present

## 2015-11-23 DIAGNOSIS — Z5181 Encounter for therapeutic drug level monitoring: Secondary | ICD-10-CM | POA: Diagnosis not present

## 2015-11-23 DIAGNOSIS — Z7901 Long term (current) use of anticoagulants: Secondary | ICD-10-CM | POA: Diagnosis not present

## 2015-11-23 DIAGNOSIS — Z48816 Encounter for surgical aftercare following surgery on the genitourinary system: Secondary | ICD-10-CM | POA: Diagnosis not present

## 2015-11-23 DIAGNOSIS — Z87442 Personal history of urinary calculi: Secondary | ICD-10-CM | POA: Diagnosis not present

## 2015-11-23 DIAGNOSIS — Z960 Presence of urogenital implants: Secondary | ICD-10-CM | POA: Diagnosis not present

## 2015-11-23 DIAGNOSIS — I110 Hypertensive heart disease with heart failure: Secondary | ICD-10-CM | POA: Diagnosis not present

## 2015-11-23 DIAGNOSIS — Z792 Long term (current) use of antibiotics: Secondary | ICD-10-CM | POA: Diagnosis not present

## 2015-11-23 DIAGNOSIS — I5040 Unspecified combined systolic (congestive) and diastolic (congestive) heart failure: Secondary | ICD-10-CM | POA: Diagnosis not present

## 2015-11-23 DIAGNOSIS — I2699 Other pulmonary embolism without acute cor pulmonale: Secondary | ICD-10-CM | POA: Diagnosis not present

## 2015-11-23 DIAGNOSIS — Z87891 Personal history of nicotine dependence: Secondary | ICD-10-CM | POA: Diagnosis not present

## 2015-11-24 ENCOUNTER — Telehealth: Payer: Self-pay | Admitting: Pharmacist

## 2015-11-24 MED ORDER — WARFARIN SODIUM 3 MG PO TABS: ORAL_TABLET | ORAL | 2 refills | Status: DC

## 2015-11-24 MED ORDER — WARFARIN SODIUM 3 MG PO TABS: 1.5000 mg | ORAL_TABLET | Freq: Every day | ORAL | 5 refills | Status: DC

## 2015-11-24 NOTE — Telephone Encounter (Signed)
Spoke with patient. She endorses having been taking 1 tablet (3mg ) on MWF; 1/2 tablet (1.5mg ) on all other days. She was advised to continue THIS regimen. [facility] RN will recollect INR on 20-NOV-17 and call to me. Patient did request refill on her warfarin. Will send electronically her refill authorization to the [facility] on [address] as requested.

## 2015-11-24 NOTE — Telephone Encounter (Signed)
[facility] RN called at 5:38PM 13-NOV-17 with INR = 2.2 on 13.5mg  warfarin per WEEK. Will continue on this same regimen. Will call patient.

## 2015-11-26 DIAGNOSIS — Z87891 Personal history of nicotine dependence: Secondary | ICD-10-CM | POA: Diagnosis not present

## 2015-11-26 DIAGNOSIS — Z87442 Personal history of urinary calculi: Secondary | ICD-10-CM | POA: Diagnosis not present

## 2015-11-26 DIAGNOSIS — Z7901 Long term (current) use of anticoagulants: Secondary | ICD-10-CM | POA: Diagnosis not present

## 2015-11-26 DIAGNOSIS — E785 Hyperlipidemia, unspecified: Secondary | ICD-10-CM | POA: Diagnosis not present

## 2015-11-26 DIAGNOSIS — I110 Hypertensive heart disease with heart failure: Secondary | ICD-10-CM | POA: Diagnosis not present

## 2015-11-26 DIAGNOSIS — Z792 Long term (current) use of antibiotics: Secondary | ICD-10-CM | POA: Diagnosis not present

## 2015-11-26 DIAGNOSIS — Z5181 Encounter for therapeutic drug level monitoring: Secondary | ICD-10-CM | POA: Diagnosis not present

## 2015-11-26 DIAGNOSIS — I5040 Unspecified combined systolic (congestive) and diastolic (congestive) heart failure: Secondary | ICD-10-CM | POA: Diagnosis not present

## 2015-11-26 DIAGNOSIS — Z960 Presence of urogenital implants: Secondary | ICD-10-CM | POA: Diagnosis not present

## 2015-11-26 DIAGNOSIS — Z48816 Encounter for surgical aftercare following surgery on the genitourinary system: Secondary | ICD-10-CM | POA: Diagnosis not present

## 2015-11-26 DIAGNOSIS — I2699 Other pulmonary embolism without acute cor pulmonale: Secondary | ICD-10-CM | POA: Diagnosis not present

## 2015-11-26 DIAGNOSIS — Z86718 Personal history of other venous thrombosis and embolism: Secondary | ICD-10-CM | POA: Diagnosis not present

## 2015-11-26 NOTE — Patient Instructions (Addendum)
EULETA ONI  11/26/2015   Your procedure is scheduled on: 12-11-15  Report to [facility]  Entrance take [facility]  elevators to 3rd floor to  Short Stay Center at 530  AM.  Call this number if you have problems the morning of surgery [phone]   Remember: ONLY 1 PERSON MAY GO WITH YOU TO SHORT STAY TO GET  READY MORNING OF YOUR SURGERY.   Follow doctor's instructions regarding stopping Coumadin before surgery.    Do not eat food or drink liquids :After Midnight.   Take these medicines the morning of surgery with A SIP OF WATER: METOPROLOL, PANTAPRAZOLE(PROTONIX), PROAIR INHALER IF NEEDED AND BRING INHALER WITH YOU, SYMBICORT                               You may not have any metal on your body including hair pins and              piercings  Do not wear jewelry, make-up, lotions, powders or perfumes, deodorant             Do not wear nail polish.  Do not shave  48 hours prior to surgery.              Men may shave face and neck.   Do not bring valuables to the hospital. [facility] IS NOT             RESPONSIBLE   FOR VALUABLES.  Contacts, dentures or bridgework may not be worn into surgery.  Leave suitcase in the car. After surgery it may be brought to your room.     Patients discharged the day of surgery will not be allowed to drive home.  Name and phone number of your driver: Cheryll Cockayne [phone]              Please read over the following fact sheets you were given: _____________________________________________________________________             [facility] - Preparing for Surgery Before surgery, you can play an important role.  Because skin is not sterile, your skin needs to be as free of germs as possible.  You can reduce the number of germs on your skin by washing with CHG (chlorahexidine gluconate) soap before surgery.  CHG is an antiseptic cleaner which kills germs and bonds with the skin to continue killing germs even after  washing. Please DO NOT use if you have an allergy to CHG or antibacterial soaps.  If your skin becomes reddened/irritated stop using the CHG and inform your nurse when you arrive at Short Stay. Do not shave (including legs and underarms) for at least 48 hours prior to the first CHG shower.  You may shave your face/neck. Please follow these instructions carefully:  1.  Shower with CHG Soap the night before surgery and the  morning of Surgery.  2.  If you choose to wash your hair, wash your hair first as usual with your  normal  shampoo.  3.  After you shampoo, rinse your hair and body thoroughly to remove the  shampoo.                           4.  Use CHG as you would any other liquid soap.  You can  apply chg directly  to the skin and wash                       Gently with a scrungie or clean washcloth.  5.  Apply the CHG Soap to your body ONLY FROM THE NECK DOWN.   Do not use on face/ open                           Wound or open sores. Avoid contact with eyes, ears mouth and genitals (private parts).                       Wash face,  Genitals (private parts) with your normal soap.             6.  Wash thoroughly, paying special attention to the area where your surgery  will be performed.  7.  Thoroughly rinse your body with warm water from the neck down.  8.  DO NOT shower/wash with your normal soap after using and rinsing off  the CHG Soap.                9.  Pat yourself dry with a clean towel.            10.  Wear clean pajamas.            11.  Place clean sheets on your bed the night of your first shower and do not  sleep with pets. Day of Surgery : Do not apply any lotions/deodorants the morning of surgery.  Please wear clean clothes to the hospital/surgery center.  FAILURE TO FOLLOW THESE INSTRUCTIONS MAY RESULT IN THE CANCELLATION OF YOUR SURGERY PATIENT SIGNATURE_________________________________  NURSE  SIGNATURE__________________________________  ________________________________________________________________________

## 2015-11-26 NOTE — Telephone Encounter (Signed)
Vikki Ports, [facility] calls and states they will not be able to see pt anymore because they are only going to check INR and that is not an approved visit each week for [facility].

## 2015-11-27 ENCOUNTER — Encounter (HOSPITAL_COMMUNITY)
Admission: RE | Admit: 2015-11-27 | Discharge: 2015-11-27 | Disposition: A | Discharge status: Home / Self Care | Payer: Medicare Other | Source: Ambulatory Visit | Attending: Urology | Admitting: Urology

## 2015-11-27 ENCOUNTER — Encounter (HOSPITAL_COMMUNITY): Payer: Self-pay

## 2015-11-27 DIAGNOSIS — J4530 Mild persistent asthma, uncomplicated: Secondary | ICD-10-CM | POA: Diagnosis not present

## 2015-11-27 DIAGNOSIS — I10 Essential (primary) hypertension: Secondary | ICD-10-CM | POA: Insufficient documentation

## 2015-11-27 DIAGNOSIS — J309 Allergic rhinitis, unspecified: Secondary | ICD-10-CM | POA: Diagnosis not present

## 2015-11-27 DIAGNOSIS — J189 Pneumonia, unspecified organism: Secondary | ICD-10-CM

## 2015-11-27 DIAGNOSIS — A419 Sepsis, unspecified organism: Secondary | ICD-10-CM | POA: Diagnosis not present

## 2015-11-27 DIAGNOSIS — Z86718 Personal history of other venous thrombosis and embolism: Secondary | ICD-10-CM | POA: Insufficient documentation

## 2015-11-27 DIAGNOSIS — Z01812 Encounter for preprocedural laboratory examination: Secondary | ICD-10-CM | POA: Insufficient documentation

## 2015-11-27 DIAGNOSIS — E785 Hyperlipidemia, unspecified: Secondary | ICD-10-CM | POA: Diagnosis not present

## 2015-11-27 DIAGNOSIS — N201 Calculus of ureter: Secondary | ICD-10-CM | POA: Diagnosis not present

## 2015-11-27 HISTORY — DX: Personal history of urinary calculi: Z87.442

## 2015-11-27 HISTORY — DX: Heart failure, unspecified: I50.9

## 2015-11-27 HISTORY — DX: Gastro-esophageal reflux disease without esophagitis: K21.9

## 2015-11-27 HISTORY — DX: Unspecified osteoarthritis, unspecified site: M19.90

## 2015-11-27 HISTORY — DX: Bronchitis, not specified as acute or chronic: J40

## 2015-11-27 LAB — CBC
HEMATOCRIT: 30.5 % — AB (ref 36.0–46.0)
Hemoglobin: 9.4 g/dL — ABNORMAL LOW (ref 12.0–15.0)
MCH: 23.7 pg — ABNORMAL LOW (ref 26.0–34.0)
MCHC: 30.8 g/dL (ref 30.0–36.0)
MCV: 77 fL — AB (ref 78.0–100.0)
PLATELETS: 307 10*3/uL (ref 150–400)
RBC: 3.96 MIL/uL (ref 3.87–5.11)
RDW: 15.4 % (ref 11.5–15.5)
WBC: 7.3 10*3/uL (ref 4.0–10.5)

## 2015-11-27 NOTE — Pre-Procedure Instructions (Addendum)
BMP 11-13-15 epic Echo 09-21-15 epic EKG 10-28-15 epic - same results since 2012 Chest 1 View 09-16-15 epic LOV Cardio 10-28-15 epic   Pt states she is to stop Coumadin 12/06/15. Pt states she has an appointment with internal medicine Monday morning, 12/07/15. Pt states they will give her instructions about discontinuing Coumadin and starting her on Lovenox.  Pt's CXR in September is abnormal.  Pt states she was diagnosed with pneumonia in September.  Will repeat CXR today at pre-op visit.  Spoke with Dr. Renold Don and Dr. Sampson Goon.  They do not need a repeat CXR during pre-op visit.  They also reviewed pt's EKG. They do not need a repeat EKG.   However, they are requesting cardiac clearance r/t pt's recent diagnosis of CHF.  Left message for Pam at [facility]. Informed her that anesthesia is requesting cardiac clearance. Requested that she please return call.  15:50 Called Pam again in an attempt to reach her before I leave.  Left another message.  Will hand off chart to another PST RN and inform of pt status wait for cardiac clearance.

## 2015-11-29 LAB — URINE CULTURE

## 2015-11-30 ENCOUNTER — Telehealth: Payer: Self-pay | Admitting: Cardiology

## 2015-11-30 ENCOUNTER — Telehealth: Payer: Self-pay

## 2015-11-30 DIAGNOSIS — Z960 Presence of urogenital implants: Secondary | ICD-10-CM | POA: Diagnosis not present

## 2015-11-30 DIAGNOSIS — Z792 Long term (current) use of antibiotics: Secondary | ICD-10-CM | POA: Diagnosis not present

## 2015-11-30 DIAGNOSIS — Z87891 Personal history of nicotine dependence: Secondary | ICD-10-CM | POA: Diagnosis not present

## 2015-11-30 DIAGNOSIS — I5040 Unspecified combined systolic (congestive) and diastolic (congestive) heart failure: Secondary | ICD-10-CM | POA: Diagnosis not present

## 2015-11-30 DIAGNOSIS — E785 Hyperlipidemia, unspecified: Secondary | ICD-10-CM | POA: Diagnosis not present

## 2015-11-30 DIAGNOSIS — Z86718 Personal history of other venous thrombosis and embolism: Secondary | ICD-10-CM | POA: Diagnosis not present

## 2015-11-30 DIAGNOSIS — Z7901 Long term (current) use of anticoagulants: Secondary | ICD-10-CM | POA: Diagnosis not present

## 2015-11-30 DIAGNOSIS — I2699 Other pulmonary embolism without acute cor pulmonale: Secondary | ICD-10-CM | POA: Diagnosis not present

## 2015-11-30 DIAGNOSIS — I110 Hypertensive heart disease with heart failure: Secondary | ICD-10-CM | POA: Diagnosis not present

## 2015-11-30 DIAGNOSIS — Z5181 Encounter for therapeutic drug level monitoring: Secondary | ICD-10-CM | POA: Diagnosis not present

## 2015-11-30 DIAGNOSIS — Z87442 Personal history of urinary calculi: Secondary | ICD-10-CM | POA: Diagnosis not present

## 2015-11-30 DIAGNOSIS — Z48816 Encounter for surgical aftercare following surgery on the genitourinary system: Secondary | ICD-10-CM | POA: Diagnosis not present

## 2015-11-30 LAB — PROTIME-INR: INR: 3.49

## 2015-11-30 NOTE — Telephone Encounter (Signed)
New message     Request for surgical clearance:  What type of surgery is being performed?  Rt ureteroscopy and stone removal When is this surgery scheduled? 12-11-15 Are there any medications that need to be held prior to surgery and how long? Cardiac clearance only.  PCP will do a lovenox bridge Name of physician performing surgery? Dr Marlou Porch What is your office phone and fax number? Fax [phone]

## 2015-11-30 NOTE — Progress Notes (Signed)
Urine culture results in epic per PAT visit 11/27/2015 sent to Dr Marlou Porch

## 2015-11-30 NOTE — Telephone Encounter (Signed)
Spoke with Pam at [facility]  States that pt needs  Cardiac clearance only. Pt's PCP will do a Lovenox bridge. Pt is scheduled for Ureteroscopy and stone removal on 12/11/15 with Dr Marlou Porch. Pam would like to have a note saying pt is clear for procedure to fax # [phone].

## 2015-11-30 NOTE — Telephone Encounter (Signed)
Cindy from [facility] requesting VO.

## 2015-12-01 NOTE — Telephone Encounter (Signed)
Pt is being discharged from [facility], if we need to resume after surg there will need to be an order preferably while inpt, please pass this along to appropriate members of her healthcare providers

## 2015-12-02 ENCOUNTER — Other Ambulatory Visit: Payer: Self-pay | Admitting: Pharmacist

## 2015-12-02 DIAGNOSIS — Z792 Long term (current) use of antibiotics: Secondary | ICD-10-CM | POA: Diagnosis not present

## 2015-12-02 DIAGNOSIS — Z87891 Personal history of nicotine dependence: Secondary | ICD-10-CM | POA: Diagnosis not present

## 2015-12-02 DIAGNOSIS — Z5181 Encounter for therapeutic drug level monitoring: Secondary | ICD-10-CM | POA: Diagnosis not present

## 2015-12-02 DIAGNOSIS — I110 Hypertensive heart disease with heart failure: Secondary | ICD-10-CM | POA: Diagnosis not present

## 2015-12-02 DIAGNOSIS — Z87442 Personal history of urinary calculi: Secondary | ICD-10-CM | POA: Diagnosis not present

## 2015-12-02 DIAGNOSIS — I5040 Unspecified combined systolic (congestive) and diastolic (congestive) heart failure: Secondary | ICD-10-CM | POA: Diagnosis not present

## 2015-12-02 DIAGNOSIS — I2699 Other pulmonary embolism without acute cor pulmonale: Secondary | ICD-10-CM | POA: Diagnosis not present

## 2015-12-02 DIAGNOSIS — Z86718 Personal history of other venous thrombosis and embolism: Secondary | ICD-10-CM | POA: Diagnosis not present

## 2015-12-02 DIAGNOSIS — Z7901 Long term (current) use of anticoagulants: Secondary | ICD-10-CM | POA: Diagnosis not present

## 2015-12-02 DIAGNOSIS — Z960 Presence of urogenital implants: Secondary | ICD-10-CM | POA: Diagnosis not present

## 2015-12-02 DIAGNOSIS — Z48816 Encounter for surgical aftercare following surgery on the genitourinary system: Secondary | ICD-10-CM | POA: Diagnosis not present

## 2015-12-02 DIAGNOSIS — E785 Hyperlipidemia, unspecified: Secondary | ICD-10-CM | POA: Diagnosis not present

## 2015-12-02 NOTE — Telephone Encounter (Signed)
This patient would be at an intermediate risk for a low to intermediate risk procedure.  Would not hold her heart failure medications if possible or restart them as soon as possible post procedure.

## 2015-12-02 NOTE — Patient Outreach (Signed)
Outreach call to Caroline [name] regarding her request for follow up from the [facility] Medication Adherence Campaign. Called and spoke with patient. HIPAA identifiers verified and verbal consent received.  [name] reports that she has been taking her pravastatin daily as directed. Reports that she rarely misses a dose. Denies barriers to taking her medication such as cost or side effects. Note that per the patient's EPIC medication list, she has both pravastatin 80 mg and atorvastatin 10 mg listed. [name] reports that she had been switched over to atorvastatin when she was in the nursing home, but was switched back to pravastatin after leaving the facility. Accordingly will remove atorvastatin from the patient's EPIC medication list.  [name] denies any medication questions or concerns at this time. Provide patient with my phone number. Patient expresses appreciation for the call.  Duanne Moron, PharmD Clinical Pharmacist [facility] Care Management [phone]

## 2015-12-02 NOTE — Telephone Encounter (Signed)
Faxed note from Dr. Elberta Fortis to [facility] stating pt would be an intermediate risk for a low to intermediate risk procedure. Faxed to [phone]. Also LM on Pam's VM stating above.

## 2015-12-07 ENCOUNTER — Encounter: Payer: Self-pay | Admitting: Internal Medicine

## 2015-12-07 ENCOUNTER — Telehealth: Payer: Self-pay | Admitting: *Deleted

## 2015-12-07 ENCOUNTER — Ambulatory Visit (INDEPENDENT_AMBULATORY_CARE_PROVIDER_SITE_OTHER): Payer: Medicare Other | Admitting: Internal Medicine

## 2015-12-07 ENCOUNTER — Ambulatory Visit (INDEPENDENT_AMBULATORY_CARE_PROVIDER_SITE_OTHER): Payer: Medicare Other | Admitting: Pharmacist

## 2015-12-07 VITALS — BP 127/55 | HR 86 | Temp 97.9°F | Ht 63.5 in | Wt 256.8 lb

## 2015-12-07 DIAGNOSIS — Z86718 Personal history of other venous thrombosis and embolism: Secondary | ICD-10-CM | POA: Diagnosis not present

## 2015-12-07 DIAGNOSIS — Z960 Presence of urogenital implants: Secondary | ICD-10-CM

## 2015-12-07 DIAGNOSIS — I10 Essential (primary) hypertension: Secondary | ICD-10-CM

## 2015-12-07 DIAGNOSIS — I82509 Chronic embolism and thrombosis of unspecified deep veins of unspecified lower extremity: Secondary | ICD-10-CM

## 2015-12-07 DIAGNOSIS — I82409 Acute embolism and thrombosis of unspecified deep veins of unspecified lower extremity: Secondary | ICD-10-CM

## 2015-12-07 DIAGNOSIS — Z0000 Encounter for general adult medical examination without abnormal findings: Secondary | ICD-10-CM

## 2015-12-07 DIAGNOSIS — Z7901 Long term (current) use of anticoagulants: Secondary | ICD-10-CM

## 2015-12-07 DIAGNOSIS — I110 Hypertensive heart disease with heart failure: Secondary | ICD-10-CM | POA: Diagnosis not present

## 2015-12-07 DIAGNOSIS — N201 Calculus of ureter: Secondary | ICD-10-CM

## 2015-12-07 DIAGNOSIS — I5042 Chronic combined systolic (congestive) and diastolic (congestive) heart failure: Secondary | ICD-10-CM

## 2015-12-07 DIAGNOSIS — Z23 Encounter for immunization: Secondary | ICD-10-CM

## 2015-12-07 LAB — POCT INR: INR: 2.2

## 2015-12-07 MED ORDER — LOSARTAN POTASSIUM 25 MG PO TABS: 25.0000 mg | ORAL_TABLET | Freq: Every day | ORAL | 5 refills | Status: DC

## 2015-12-07 NOTE — Patient Instructions (Signed)
Patient instructed to take medications as defined in the Anti-coagulation Track section of this encounter.  Patient instructed to OMIT today's dose and all subsequent doses for remainder of week---resume warfarin on the day AFTER your surgery. Follow these instructions provided: 1. Stop warfarin/Coumadin FIVE (5) days in advance of planned procedure. Warfarin discontinuation began on Sunday, December 06, 2015. DO NOT TAKE ANYMORE WARFARIN UNTIL DAY AFTER PROCEDURE.  2. 36 hours after stopping warfarin, commence Lovenox injections, use 1 syringe of the 150mg  strength Lovenox provided, and administer 2 inches off of the umbilicus (belly button) at level of waist-band. Rotate injection sites-right; left; right, etc.  3. Lovenox injections to be administered every 24 hours-based upon time that your home health agency nurse can be at your home.  Administer/use the 150mg  strength syringes PRE-OPERATIVELY.  4. STOP Lovenox (last dose) 24 hours BEFORE planned procedure.  5. 24 hours AFTER procedure, start BOTH the lovenox shots and warfarin. Use Lovenox syringes until you have administered all syringes. POST-OPERATIVELY, USE the 120mg  strength syringes to be injected by home health agency nurse every 24 hours.  6. COME TO CLINIC the FIRST [facility] after your procedure.   Patient verbalized understanding of these instructions and was provided a written copy. These instructions are being provided to [place] by [facility] our Triage Nurse.

## 2015-12-07 NOTE — Telephone Encounter (Signed)
Spoke to [facility], gave orders that were printed and faxed them to main intake # for [facility], requested Edgar Frisk to resume homecare visits w/ pt., will send for VO approval to dr Mikey Bussing

## 2015-12-07 NOTE — Assessment & Plan Note (Signed)
BP well controlled. Patient complaining of dry cough that started a few weeks ago. Most likely due to Lisinopril. Will switch her to Losartan 25 mg daily. Continue Toprol-XL 50 mg daily.

## 2015-12-07 NOTE — Telephone Encounter (Signed)
Agree with VO

## 2015-12-07 NOTE — Assessment & Plan Note (Addendum)
Mildly reduced EF noted on echo in September while she was hospitalized for sepsis. She was supposed to get a stress test in October prior to her urological procedure but this was not performed. Evaluated by Cardiology as outpatient and think likely related to hx of uncontrolled hypertension. I will discuss with Cardiology if they think a stress test would be warranted. She has no hx of CAD, but with her mildly reduced EF I think ischemic evaluation should be done. She has been doing well with Lasix 40 mg every other day and supplemental potassium. Will check bmet today to evaluate kidney function and potassium level. Switched from Lisinopril to Losartan today due to cough. Continue Toprol-XL 50 mg daily.

## 2015-12-07 NOTE — Patient Instructions (Addendum)
General Instructions: - STOP Lisinopril - Start Losartan 25 mg daily - We will get blood work today to check your potassium level and kidney function - Follow directions for lovenox and coumadin schedule  Thank you for bringing your medicines today. This helps [place] keep you safe from mistakes.   Progress Toward Treatment Goals:  Treatment Goal 11/03/2014  Blood pressure unable to assess    Self Care Goals & Plans:  Self Care Goal 05/05/2015  Manage my medications take my medicines as prescribed; bring my medications to every visit; refill my medications on time  Eat healthy foods eat more vegetables; eat foods that are low in salt; eat baked foods instead of fried foods  Be physically active find an activity I enjoy  Meeting treatment goals -    No flowsheet data found.   Care Management & Community Referrals:  Referral 10/30/2012  Referrals made for care management support none needed

## 2015-12-07 NOTE — Assessment & Plan Note (Signed)
S/p ureteral stent 9/6 and scheduled to undergo lithotripsy on 12/1. She will be on a lovenox bridge prior to surgery and then switch to lovenox/coumadin post-surgery until INR therapeutic. She has follow up in our coumadin clinic on 12/4.

## 2015-12-07 NOTE — Progress Notes (Signed)
   CC: Hypertension   HPI:  [name] is a 70 y.o. woman with PMHx as noted below who presents today for follow up of her hypertension.  HTN: BP well controlled at 127/55 today. Her HCTZ was stopped after she was hospitalized for klebsiella pneumoniae sepsis due to a ureteral stone. She was seen by Cardiology and started on Lisinopril 5 mg daily and switched from metoprolol tartrate to metoprolol succinate. She reports a dry cough for the last few weeks. Denies any dizziness.   Combined CHF: Echo in Sept showed mildly depressed EF 45-50% and grade 2 diastolic dysfunction. She was started on Lasix 40 mg every other day and K-Dur 40 mEq daily. Cardiology feels likely related to uncontrolled hypertension in the past. She denies any chest pain, SOB. She feels much better since losing water weight with lasix.   Right Ureteral Stone s/p Stent: She was hospitalized from 9/6-9/13 for klebsiella pneumoniae sepsis secondary to a right ureteral stone. She underwent ureteral stent placement and is scheduled to undergo lithotripsy and stone removal this Friday (Dec 1st).  Reports feeling stronger, walking better since rehab. She will be on a lovenox bridge with her upcoming surgery.  Hx Recurrent DVTs: On lifelong Coumadin. INR had been supratherapeutic over last few weeks but was checked today and in range at 2.2. Her Coumadin had been decreased to 1.5 mg daily. She will be completing a lovenox bridge with her upcoming surgery.    Past Medical History:  Diagnosis Date  . Arthritis   . Asthma   . Bronchitis   . CHF (congestive heart failure) (HCC)   . DVT, lower extremity, recurrent (HCC)    Patient had unprovoked PE on 2002 and DVT in right lower extremety 2008.  [name] GERD (gastroesophageal reflux disease)   . History of kidney stones   . Hypertension   . PE (pulmonary embolism)    Patient had unprovoked PE on 2002  . Vertigo     Review of Systems:  All negative except per HPI  Physical  Exam:  Vitals:   12/07/15 1106  BP: (!) 127/55  Pulse: 86  Temp: 97.9 F (36.6 C)  TempSrc: Oral  SpO2: 99%  Weight: 256 lb 12.8 oz (116.5 kg)  Height: 5' 3.5" (1.613 m)   General: alert, sitting up, NAD HEENT: [place]/AT, EOMI, sclera anicteric, mucus membranes moist CV: RRR, no m/g/r Pulm: CTA bilaterally, breaths non-labored Ext: warm, no peripheral edema Neuro: alert and oriented x 3  Assessment & Plan:   See Encounters Tab for problem based charting.  Patient discussed with Dr. Cleda Daub

## 2015-12-07 NOTE — Assessment & Plan Note (Signed)
INR therapeutic today at 2.2. She will begin Lovenox bridge today. We reviewed the directions together that were given to her by the coumadin clinic. She has follow up on 12/4 for repeat INR.

## 2015-12-07 NOTE — Progress Notes (Signed)
Bridging Instructions 1. Stop warfarin/Coumadin FIVE (5) days in advance of planned procedure. Warfarin discontinuation began on Sunday, December 06, 2015. DO NOT TAKE ANYMORE WARFARIN UNTIL DAY AFTER PROCEDURE.  2. 36 hours after stopping warfarin, commence Lovenox injections, use 1 syringe of the 150mg  strength Lovenox provided, and administer 2 inches off of the umbilicus (belly button) at level of waist-band. Rotate injection sites-right; left; right, etc.  3. Lovenox injections to be administered every 24 hours-based upon time that your home health agency nurse can be at your home.  Administer/use the 150mg  strength syringes PRE-OPERATIVELY.  4. STOP Lovenox (last dose) 24 hours BEFORE planned procedure.  5. 24 hours AFTER procedure, start BOTH the lovenox shots and warfarin. Use Lovenox syringes until you have administered all syringes. POST-OPERATIVELY, USE the 120mg  strength syringes to be injected by home health agency nurse every 24 hours.  6. COME TO CLINIC the FIRST [facility] after your procedure.

## 2015-12-07 NOTE — Assessment & Plan Note (Signed)
Pneumovax 13 given today

## 2015-12-08 ENCOUNTER — Telehealth: Payer: Self-pay | Admitting: Internal Medicine

## 2015-12-08 DIAGNOSIS — I110 Hypertensive heart disease with heart failure: Secondary | ICD-10-CM | POA: Diagnosis not present

## 2015-12-08 DIAGNOSIS — J4530 Mild persistent asthma, uncomplicated: Secondary | ICD-10-CM | POA: Diagnosis not present

## 2015-12-08 DIAGNOSIS — D649 Anemia, unspecified: Secondary | ICD-10-CM | POA: Diagnosis not present

## 2015-12-08 DIAGNOSIS — E119 Type 2 diabetes mellitus without complications: Secondary | ICD-10-CM | POA: Diagnosis not present

## 2015-12-08 DIAGNOSIS — Z79891 Long term (current) use of opiate analgesic: Secondary | ICD-10-CM | POA: Diagnosis not present

## 2015-12-08 DIAGNOSIS — Z7901 Long term (current) use of anticoagulants: Secondary | ICD-10-CM | POA: Diagnosis not present

## 2015-12-08 DIAGNOSIS — E785 Hyperlipidemia, unspecified: Secondary | ICD-10-CM | POA: Diagnosis not present

## 2015-12-08 DIAGNOSIS — Z5181 Encounter for therapeutic drug level monitoring: Secondary | ICD-10-CM | POA: Diagnosis not present

## 2015-12-08 DIAGNOSIS — I509 Heart failure, unspecified: Secondary | ICD-10-CM | POA: Diagnosis not present

## 2015-12-08 DIAGNOSIS — D759 Disease of blood and blood-forming organs, unspecified: Secondary | ICD-10-CM | POA: Diagnosis not present

## 2015-12-08 DIAGNOSIS — Z48816 Encounter for surgical aftercare following surgery on the genitourinary system: Secondary | ICD-10-CM | POA: Diagnosis not present

## 2015-12-08 LAB — BMP8+ANION GAP
ANION GAP: 19 mmol/L — AB (ref 10.0–18.0)
BUN / CREAT RATIO: 13 (ref 12–28)
BUN: 13 mg/dL (ref 8–27)
CHLORIDE: 100 mmol/L (ref 96–106)
CO2: 26 mmol/L (ref 18–29)
Calcium: 9.7 mg/dL (ref 8.7–10.3)
Creatinine, Ser: 0.98 mg/dL (ref 0.57–1.00)
GFR calc Af Amer: 69 mL/min/{1.73_m2} (ref >59)
GFR calc non Af Amer: 59 mL/min/{1.73_m2} — ABNORMAL LOW (ref >59)
GLUCOSE: 104 mg/dL — AB (ref 65–99)
Potassium: 3.7 mmol/L (ref 3.5–5.2)
SODIUM: 145 mmol/L — AB (ref 134–144)

## 2015-12-08 NOTE — Telephone Encounter (Signed)
Need PT order for  2x week  For last week

## 2015-12-08 NOTE — Progress Notes (Signed)
Internal Medicine Clinic Attending  Case discussed with Dr. Rivet at the time of the visit.  We reviewed the resident's history and exam and pertinent patient test results.  I agree with the assessment, diagnosis, and plan of care documented in the resident's note.  

## 2015-12-09 DIAGNOSIS — Z5181 Encounter for therapeutic drug level monitoring: Secondary | ICD-10-CM | POA: Diagnosis not present

## 2015-12-09 DIAGNOSIS — J4530 Mild persistent asthma, uncomplicated: Secondary | ICD-10-CM | POA: Diagnosis not present

## 2015-12-09 DIAGNOSIS — E119 Type 2 diabetes mellitus without complications: Secondary | ICD-10-CM | POA: Diagnosis not present

## 2015-12-09 DIAGNOSIS — Z7901 Long term (current) use of anticoagulants: Secondary | ICD-10-CM | POA: Diagnosis not present

## 2015-12-09 DIAGNOSIS — D649 Anemia, unspecified: Secondary | ICD-10-CM | POA: Diagnosis not present

## 2015-12-09 DIAGNOSIS — E785 Hyperlipidemia, unspecified: Secondary | ICD-10-CM | POA: Diagnosis not present

## 2015-12-09 DIAGNOSIS — D759 Disease of blood and blood-forming organs, unspecified: Secondary | ICD-10-CM | POA: Diagnosis not present

## 2015-12-09 DIAGNOSIS — Z79891 Long term (current) use of opiate analgesic: Secondary | ICD-10-CM | POA: Diagnosis not present

## 2015-12-09 DIAGNOSIS — I110 Hypertensive heart disease with heart failure: Secondary | ICD-10-CM | POA: Diagnosis not present

## 2015-12-09 DIAGNOSIS — Z48816 Encounter for surgical aftercare following surgery on the genitourinary system: Secondary | ICD-10-CM | POA: Diagnosis not present

## 2015-12-09 DIAGNOSIS — I509 Heart failure, unspecified: Secondary | ICD-10-CM | POA: Diagnosis not present

## 2015-12-10 DIAGNOSIS — I110 Hypertensive heart disease with heart failure: Secondary | ICD-10-CM | POA: Diagnosis not present

## 2015-12-10 DIAGNOSIS — D759 Disease of blood and blood-forming organs, unspecified: Secondary | ICD-10-CM | POA: Diagnosis not present

## 2015-12-10 DIAGNOSIS — I509 Heart failure, unspecified: Secondary | ICD-10-CM | POA: Diagnosis not present

## 2015-12-10 DIAGNOSIS — D649 Anemia, unspecified: Secondary | ICD-10-CM | POA: Diagnosis not present

## 2015-12-10 DIAGNOSIS — E785 Hyperlipidemia, unspecified: Secondary | ICD-10-CM | POA: Diagnosis not present

## 2015-12-10 DIAGNOSIS — J4530 Mild persistent asthma, uncomplicated: Secondary | ICD-10-CM | POA: Diagnosis not present

## 2015-12-10 DIAGNOSIS — Z79891 Long term (current) use of opiate analgesic: Secondary | ICD-10-CM | POA: Diagnosis not present

## 2015-12-10 DIAGNOSIS — Z7901 Long term (current) use of anticoagulants: Secondary | ICD-10-CM | POA: Diagnosis not present

## 2015-12-10 DIAGNOSIS — Z5181 Encounter for therapeutic drug level monitoring: Secondary | ICD-10-CM | POA: Diagnosis not present

## 2015-12-10 DIAGNOSIS — E119 Type 2 diabetes mellitus without complications: Secondary | ICD-10-CM | POA: Diagnosis not present

## 2015-12-10 DIAGNOSIS — Z48816 Encounter for surgical aftercare following surgery on the genitourinary system: Secondary | ICD-10-CM | POA: Diagnosis not present

## 2015-12-11 ENCOUNTER — Ambulatory Visit (HOSPITAL_COMMUNITY): Payer: Medicare Other | Admitting: Registered Nurse

## 2015-12-11 ENCOUNTER — Encounter (HOSPITAL_COMMUNITY)
Admission: RE | Disposition: A | Discharge status: Home / Self Care | Payer: Self-pay | Source: Ambulatory Visit | Attending: Urology

## 2015-12-11 ENCOUNTER — Ambulatory Visit (HOSPITAL_COMMUNITY)
Admission: RE | Admit: 2015-12-11 | Discharge: 2015-12-11 | Disposition: A | Discharge status: Home / Self Care | Payer: Medicare Other | Source: Ambulatory Visit | Attending: Urology | Admitting: Urology

## 2015-12-11 ENCOUNTER — Encounter (HOSPITAL_COMMUNITY): Payer: Self-pay | Admitting: *Deleted

## 2015-12-11 DIAGNOSIS — I119 Hypertensive heart disease without heart failure: Secondary | ICD-10-CM | POA: Insufficient documentation

## 2015-12-11 DIAGNOSIS — Z880 Allergy status to penicillin: Secondary | ICD-10-CM | POA: Insufficient documentation

## 2015-12-11 DIAGNOSIS — R42 Dizziness and giddiness: Secondary | ICD-10-CM | POA: Insufficient documentation

## 2015-12-11 DIAGNOSIS — N132 Hydronephrosis with renal and ureteral calculous obstruction: Secondary | ICD-10-CM | POA: Insufficient documentation

## 2015-12-11 DIAGNOSIS — N201 Calculus of ureter: Secondary | ICD-10-CM | POA: Diagnosis not present

## 2015-12-11 DIAGNOSIS — Z6841 Body Mass Index (BMI) 40.0 and over, adult: Secondary | ICD-10-CM | POA: Insufficient documentation

## 2015-12-11 DIAGNOSIS — Z841 Family history of disorders of kidney and ureter: Secondary | ICD-10-CM | POA: Insufficient documentation

## 2015-12-11 DIAGNOSIS — J45909 Unspecified asthma, uncomplicated: Secondary | ICD-10-CM | POA: Insufficient documentation

## 2015-12-11 DIAGNOSIS — Z7901 Long term (current) use of anticoagulants: Secondary | ICD-10-CM | POA: Diagnosis not present

## 2015-12-11 DIAGNOSIS — Z91013 Allergy to seafood: Secondary | ICD-10-CM | POA: Insufficient documentation

## 2015-12-11 DIAGNOSIS — I209 Angina pectoris, unspecified: Secondary | ICD-10-CM | POA: Diagnosis not present

## 2015-12-11 DIAGNOSIS — I509 Heart failure, unspecified: Secondary | ICD-10-CM | POA: Diagnosis not present

## 2015-12-11 DIAGNOSIS — R000 Tachycardia, unspecified: Secondary | ICD-10-CM | POA: Insufficient documentation

## 2015-12-11 DIAGNOSIS — Z86718 Personal history of other venous thrombosis and embolism: Secondary | ICD-10-CM | POA: Diagnosis not present

## 2015-12-11 DIAGNOSIS — K219 Gastro-esophageal reflux disease without esophagitis: Secondary | ICD-10-CM | POA: Insufficient documentation

## 2015-12-11 DIAGNOSIS — Z91018 Allergy to other foods: Secondary | ICD-10-CM | POA: Insufficient documentation

## 2015-12-11 DIAGNOSIS — Z86711 Personal history of pulmonary embolism: Secondary | ICD-10-CM | POA: Diagnosis not present

## 2015-12-11 DIAGNOSIS — E7800 Pure hypercholesterolemia, unspecified: Secondary | ICD-10-CM | POA: Diagnosis not present

## 2015-12-11 DIAGNOSIS — E119 Type 2 diabetes mellitus without complications: Secondary | ICD-10-CM | POA: Diagnosis not present

## 2015-12-11 DIAGNOSIS — Z79899 Other long term (current) drug therapy: Secondary | ICD-10-CM | POA: Insufficient documentation

## 2015-12-11 DIAGNOSIS — I4510 Unspecified right bundle-branch block: Secondary | ICD-10-CM | POA: Diagnosis not present

## 2015-12-11 DIAGNOSIS — N202 Calculus of kidney with calculus of ureter: Secondary | ICD-10-CM | POA: Diagnosis not present

## 2015-12-11 DIAGNOSIS — I110 Hypertensive heart disease with heart failure: Secondary | ICD-10-CM | POA: Diagnosis not present

## 2015-12-11 DIAGNOSIS — F329 Major depressive disorder, single episode, unspecified: Secondary | ICD-10-CM | POA: Diagnosis not present

## 2015-12-11 DIAGNOSIS — N209 Urinary calculus, unspecified: Secondary | ICD-10-CM | POA: Diagnosis present

## 2015-12-11 HISTORY — DX: Other specified postprocedural states: R11.2

## 2015-12-11 HISTORY — PX: HOLMIUM LASER APPLICATION: SHX5852

## 2015-12-11 HISTORY — DX: Other specified postprocedural states: Z98.890

## 2015-12-11 HISTORY — PX: CYSTOSCOPY WITH RETROGRADE PYELOGRAM, URETEROSCOPY AND STENT PLACEMENT: SHX5789

## 2015-12-11 LAB — PROTIME-INR
INR: 1.66
Prothrombin Time: 19.8 seconds — ABNORMAL HIGH (ref 11.4–15.2)

## 2015-12-11 SURGERY — CYSTOURETEROSCOPY, WITH RETROGRADE PYELOGRAM AND STENT INSERTION
Anesthesia: General | Site: Ureter | Laterality: Right

## 2015-12-11 MED ORDER — IOHEXOL 300 MG/ML  SOLN
INTRAMUSCULAR | Status: DC | PRN
  Administered 2015-12-11: 50 mL

## 2015-12-11 MED ORDER — SUCCINYLCHOLINE CHLORIDE 20 MG/ML IJ SOLN
INTRAMUSCULAR | Status: DC | PRN
  Administered 2015-12-11: 60 mg via INTRAVENOUS

## 2015-12-11 MED ORDER — CIPROFLOXACIN IN D5W 400 MG/200ML IV SOLN
400.0000 mg | INTRAVENOUS | Status: AC
  Administered 2015-12-11: 400 mg via INTRAVENOUS

## 2015-12-11 MED ORDER — ONDANSETRON HCL 4 MG/2ML IJ SOLN
INTRAMUSCULAR | Status: DC | PRN
  Administered 2015-12-11: 4 mg via INTRAVENOUS

## 2015-12-11 MED ORDER — OXYCODONE HCL 5 MG PO TABS: 5.0000 mg | ORAL_TABLET | Freq: Once | ORAL | Status: DC | PRN

## 2015-12-11 MED ORDER — PROPOFOL 10 MG/ML IV BOLUS
INTRAVENOUS | Status: AC
  Filled 2015-12-11: qty 40

## 2015-12-11 MED ORDER — MIDAZOLAM HCL 2 MG/2ML IJ SOLN
INTRAMUSCULAR | Status: AC
  Filled 2015-12-11: qty 2

## 2015-12-11 MED ORDER — CIPROFLOXACIN IN D5W 400 MG/200ML IV SOLN
INTRAVENOUS | Status: AC
  Filled 2015-12-11: qty 200

## 2015-12-11 MED ORDER — FENTANYL CITRATE (PF) 100 MCG/2ML IJ SOLN: 25.0000 ug | INTRAMUSCULAR | Status: DC | PRN

## 2015-12-11 MED ORDER — FENTANYL CITRATE (PF) 100 MCG/2ML IJ SOLN
INTRAMUSCULAR | Status: DC | PRN
  Administered 2015-12-11 (×2): 50 ug via INTRAVENOUS

## 2015-12-11 MED ORDER — ROCURONIUM BROMIDE 50 MG/5ML IV SOSY
PREFILLED_SYRINGE | INTRAVENOUS | Status: AC
  Filled 2015-12-11: qty 5

## 2015-12-11 MED ORDER — DEXAMETHASONE SODIUM PHOSPHATE 10 MG/ML IJ SOLN
INTRAMUSCULAR | Status: DC | PRN
  Administered 2015-12-11: 10 mg via INTRAVENOUS

## 2015-12-11 MED ORDER — LIDOCAINE 2% (20 MG/ML) 5 ML SYRINGE
INTRAMUSCULAR | Status: AC
  Filled 2015-12-11: qty 5

## 2015-12-11 MED ORDER — WARFARIN SODIUM 3 MG PO TABS: ORAL_TABLET | ORAL | 2 refills | Status: DC

## 2015-12-11 MED ORDER — LIDOCAINE HCL 2 % EX GEL
CUTANEOUS | Status: AC
  Filled 2015-12-11: qty 5

## 2015-12-11 MED ORDER — SUGAMMADEX SODIUM 200 MG/2ML IV SOLN
INTRAVENOUS | Status: AC
  Filled 2015-12-11: qty 2

## 2015-12-11 MED ORDER — FENTANYL CITRATE (PF) 100 MCG/2ML IJ SOLN
INTRAMUSCULAR | Status: AC
  Filled 2015-12-11: qty 2

## 2015-12-11 MED ORDER — ONDANSETRON HCL 4 MG/2ML IJ SOLN
INTRAMUSCULAR | Status: AC
  Filled 2015-12-11: qty 2

## 2015-12-11 MED ORDER — ROCURONIUM BROMIDE 50 MG/5ML IV SOSY
PREFILLED_SYRINGE | INTRAVENOUS | Status: DC | PRN
  Administered 2015-12-11: 30 mg via INTRAVENOUS

## 2015-12-11 MED ORDER — SUGAMMADEX SODIUM 200 MG/2ML IV SOLN
INTRAVENOUS | Status: DC | PRN
  Administered 2015-12-11: 200 mg via INTRAVENOUS

## 2015-12-11 MED ORDER — SODIUM CHLORIDE 0.9 % IR SOLN
Status: DC | PRN
  Administered 2015-12-11: 4000 mL

## 2015-12-11 MED ORDER — OXYCODONE HCL 5 MG/5ML PO SOLN
5.0000 mg | Freq: Once | ORAL | Status: DC | PRN
  Filled 2015-12-11: qty 5

## 2015-12-11 MED ORDER — LIDOCAINE HCL 2 % EX GEL
CUTANEOUS | Status: DC | PRN
  Administered 2015-12-11: 1

## 2015-12-11 MED ORDER — SUCCINYLCHOLINE CHLORIDE 200 MG/10ML IV SOSY
PREFILLED_SYRINGE | INTRAVENOUS | Status: AC
  Filled 2015-12-11: qty 20

## 2015-12-11 MED ORDER — PHENAZOPYRIDINE HCL 200 MG PO TABS: 200.0000 mg | ORAL_TABLET | Freq: Three times a day (TID) | ORAL | 0 refills | Status: DC | PRN

## 2015-12-11 MED ORDER — DEXAMETHASONE SODIUM PHOSPHATE 10 MG/ML IJ SOLN
INTRAMUSCULAR | Status: AC
  Filled 2015-12-11: qty 1

## 2015-12-11 MED ORDER — LIDOCAINE 2% (20 MG/ML) 5 ML SYRINGE
INTRAMUSCULAR | Status: DC | PRN
  Administered 2015-12-11: 60 mg via INTRAVENOUS

## 2015-12-11 MED ORDER — BELLADONNA ALKALOIDS-OPIUM 16.2-60 MG RE SUPP
RECTAL | Status: DC | PRN
  Administered 2015-12-11: 1 via RECTAL

## 2015-12-11 MED ORDER — LACTATED RINGERS IV SOLN
INTRAVENOUS | Status: DC | PRN
  Administered 2015-12-11 (×2): via INTRAVENOUS

## 2015-12-11 MED ORDER — BELLADONNA ALKALOIDS-OPIUM 16.2-60 MG RE SUPP
RECTAL | Status: AC
  Filled 2015-12-11: qty 1

## 2015-12-11 MED ORDER — CIPROFLOXACIN HCL 500 MG PO TABS: 500.0000 mg | ORAL_TABLET | Freq: Once | ORAL | 0 refills | Status: AC

## 2015-12-11 MED ORDER — TRAMADOL HCL 50 MG PO TABS: 50.0000 mg | ORAL_TABLET | Freq: Four times a day (QID) | ORAL | 0 refills | Status: DC | PRN

## 2015-12-11 MED ORDER — PROPOFOL 10 MG/ML IV BOLUS
INTRAVENOUS | Status: DC | PRN
  Administered 2015-12-11: 70 mg via INTRAVENOUS
  Administered 2015-12-11: 130 mg via INTRAVENOUS

## 2015-12-11 SURGICAL SUPPLY — 24 items
BAG URO CATCHER STRL LF (MISCELLANEOUS) ×3 IMPLANT
BASKET DAKOTA 1.9FR 11X120 (BASKET) IMPLANT
BASKET ZERO TIP NITINOL 2.4FR (BASKET) IMPLANT
BSKT STON RTRVL ZERO TP 2.4FR (BASKET)
CATH URET 5FR 28IN OPEN ENDED (CATHETERS) ×3 IMPLANT
CLOTH BEACON ORANGE TIMEOUT ST (SAFETY) ×3 IMPLANT
EXTRACTOR STONE NITINOL NGAGE (UROLOGICAL SUPPLIES) ×4 IMPLANT
FIBER LASER FLEXIVA 1000 (UROLOGICAL SUPPLIES) IMPLANT
FIBER LASER FLEXIVA 365 (UROLOGICAL SUPPLIES) IMPLANT
FIBER LASER FLEXIVA 550 (UROLOGICAL SUPPLIES) IMPLANT
FIBER LASER TRAC TIP (UROLOGICAL SUPPLIES) ×2 IMPLANT
GLOVE BIOGEL M STRL SZ7.5 (GLOVE) ×3 IMPLANT
GOWN STRL REUS W/TWL XL LVL3 (GOWN DISPOSABLE) ×3 IMPLANT
GUIDEWIRE ANG ZIPWIRE 038X150 (WIRE) IMPLANT
GUIDEWIRE STR DUAL SENSOR (WIRE) ×3 IMPLANT
MANIFOLD NEPTUNE II (INSTRUMENTS) ×3 IMPLANT
PACK CYSTO (CUSTOM PROCEDURE TRAY) ×3 IMPLANT
SHEATH ACCESS URETERAL 24CM (SHEATH) IMPLANT
SHEATH ACCESS URETERAL 38CM (SHEATH) ×2 IMPLANT
SHEATH ACCESS URETERAL 54CM (SHEATH) IMPLANT
STENT URET 6FRX24 CONTOUR (STENTS) ×2 IMPLANT
TUBING CONNECTING 10 (TUBING) ×2 IMPLANT
TUBING CONNECTING 10' (TUBING) ×1
WIRE COONS/BENSON .038X145CM (WIRE) ×2 IMPLANT

## 2015-12-11 NOTE — Anesthesia Postprocedure Evaluation (Signed)
Anesthesia Post Note  Patient: Caroline [name]  Procedure(s) Performed: Procedure(s) (LRB): RIGHT URETEROSCOPY/HOLMIUM LASER LITHOTRIPSY AND STONE REMOVAL removal and placement of double j stent (Right) HOLMIUM LASER APPLICATION (Right)  Patient location during evaluation: PACU Anesthesia Type: General Level of consciousness: awake Pain management: pain level controlled Vital Signs Assessment: post-procedure vital signs reviewed and stable Respiratory status: spontaneous breathing Cardiovascular status: stable Postop Assessment: no signs of nausea or vomiting Anesthetic complications: no    Last Vitals:  Vitals:   12/11/15 0956 12/11/15 1042  BP: (!) 145/59 (!) 152/72  Pulse: 84 84  Resp: 16 16  Temp: 36.7 C     Last Pain:  Vitals:   12/11/15 1042  TempSrc:   PainSc: 8                  Carlesha Seiple

## 2015-12-11 NOTE — H&P (Signed)
f/u for obstructing stone  HPI: Caroline CountsDaisy [name] is a 70 year-old female established patient who is here for further eval and management of an obstructing stone.  The patient was last seen 10/01/15.   The patient's stone is on her right side. The stone was 8x394mm stone mid-ureter. There are additional stones within the urinary tract. They are located 3 non-obstructing stones in the right.   The patient has not passed their stone since theirher visit. The patient is complaining of groin pain, progressive voiding symptoms, and hematuria. The patient underwent No imaging prior to today's appointment.   Stented by Dr. Sherron MondayMacDiarmid on Sept 6 for pending urosepsis. She then followed up with him on 9/21. Has completed antibiotics. 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     ALLERGIES: Penicillin - Swelling Shellfish Containing Products - Swelling tomatoes - Swelling    MEDICATIONS: Coumadin 3 mg tablet  Lipitor 10 mg tablet  Metoprolol Succinate  Metoprolol Succinate 25 mg tablet, extended release 24 hr  Calcium Citrate - Vitamin D  Cefuroxime 500 mg tablet  Hydrocortisone 2.5 % cream with perineal applicator  Lasix 40 mg tablet  Meclizine Hcl 25 mg tablet  Proair Hfa  Proair Hfa 90 mcg hfa aerosol with adapter  Protonix  Singulair 10 mg tablet  Symbicort     GU PSH: Cystoscopy Insert Stent - 09/16/2015      PSH Notes: KIDNEY STONE REMOVAL  STENT PLACEMENT    NON-GU PSH: None   GU PMH: None   NON-GU PMH: Anxiety Asthma Depression Diabetes Type 2 DVT, History Hypercholesterolemia Hypertension Personal history of other endocrine, nutritional and metabolic disease Pulmonary Embolism, History    FAMILY HISTORY: 2 daughters - Other 3 sons - Other Kidney Stones - Runs in Family Patient's father is deceased - Other Patient's mother is deceased - Other   SOCIAL HISTORY: Marital Status: Divorced Current Smoking Status: Patient has never smoked.  Does not use smokeless  tobacco. Does not drink anymore.  Does not use drugs. Does not drink caffeine. Has not had a blood transfusion.    REVIEW OF SYSTEMS:    GU Review Female:   Patient denies frequent urination, hard to postpone urination, burning /pain with urination, get up at night to urinate, leakage of urine, stream starts and stops, trouble starting your stream, have to strain to urinate, and currently pregnant.  Gastrointestinal (Upper):   Patient denies nausea, vomiting, and indigestion/ heartburn.  Gastrointestinal (Lower):   Patient denies diarrhea and constipation.  Constitutional:   Patient denies fever, night sweats, weight loss, and fatigue.  Skin:   Patient denies skin rash/ lesion and itching.  Eyes:   Patient denies double vision and blurred vision.  Ears/ Nose/ Throat:   Patient denies sore throat and sinus problems.  Hematologic/Lymphatic:   Patient denies swollen glands and easy bruising.  Cardiovascular:   Patient denies leg swelling and chest pains.  Respiratory:   Patient denies cough and shortness of breath.  Endocrine:   Patient denies excessive thirst.  Musculoskeletal:   Patient denies back pain and joint pain.  Neurological:   Patient denies headaches and dizziness.  Psychologic:   Patient denies depression and anxiety.   VITAL SIGNS:      10/20/2015 12:11 PM  Weight 267 lb / 121.11 kg  Height 64 in / 162.56 cm  BP 123/64 mmHg  Pulse 90 /min  BMI 45.8 kg/m   PAST DATA REVIEWED:  Source Of History:  Patient   PROCEDURES:  Urinalysis w/Scope - 81001 Dipstick Dipstick Cont'd Micro  Specimen: Voided Bilirubin: Neg WBC/hpf: >60/hpf  Color: Yellow Ketones: Neg Bacteria: Moderate (26-50/hpf)  Appearance: Cloudy Blood: 3+ Cystals: NS (Not Seen)  Specific Gravity: 1.015 Protein: 1+ Casts: NS (Not Seen)  pH: 6.5 Urobilinogen: 0.2 Trichomonas: Not Present  Glucose: Neg Nitrites: Neg Mucous: Not Present    Leukocyte Esterase: 3+ Epithelial Cells: 0-5/hpf      Yeast:  NS (Not Seen)      Sperm: Not Present    ASSESSMENT:      ICD-10 Details  1 GU:   Calculus Ureter - N20.1      PLAN:           Orders Labs Urine Culture and Sensitivity          Schedule Return Visit: 1 Week - Schedule Surgery          Document Letter(s):  Created for Patient: Clinical Summary    I went over the treatment options for their stone. We discussed ongoing medical expulsion therapy, ESWL and ureteroscopy. Ultimately the patient and I agreed that ureterscopy is the best option. I went over this surgery with the patient in detail. The patient understands after being put to sleep, we would proceed with a telescope to access the stone and potentially use a laser to fragment the stone before removing it with a basket. After removing the stone the patient will require temporary stent placement in the ureter. This is an outpatient procedure. I also discussed the potential of not being able to gain access safely into the ureter/kidney. This would require that a stent be placed and then the patient rescheduled several weeks later for a second attempt. They also understand the small risks of ureteral trauma causing a stricture or permanent damage. I also explained the risk of urinary tract infection. Having gone over the procedure itself, the expected outcome, and the risks/benefits the patient has agreed to proceed.        Notes:   patient has four stones - two in the ureter and two in the kidney. we discussed the ureteroscopic approach, we will try and get all her stones at once on the left side.

## 2015-12-11 NOTE — Discharge Instructions (Signed)
DISCHARGE INSTRUCTIONS FOR KIDNEY STONE/URETERAL STENT   MEDICATIONS:  1.  Resume all your other meds from home - except do not take any extra narcotic pain meds that you may have at home.  2. Pyridium is to help with the burning/stinging when you urinate. 3. Tramadol is for moderate/severe pain, otherwise taking upto 1000 mg every 6 hours of plainTylenol will help treat your pain.   4. Take Cipro one hour prior to removal of your stent.  5. Resume Warfarin at the normal dose tomorrow. 6. Continue with Lovenox as prescribed - recommend taking a dose this evening.   ACTIVITY:  1. No strenuous activity x 1week  2. No driving while on narcotic pain medications  3. Drink plenty of water  4. Continue to walk at home - you can still get blood clots when you are at home, so keep active, but don't over do it.  5. May return to work/school tomorrow or when you feel ready   BATHING:  1. You can shower and we recommend daily showers  2. You have a string coming from your urethra: The stent string is attached to your ureteral stent. Do not pull on this.   SIGNS/SYMPTOMS TO CALL:  Please call us if you have a fever greater than 101.5, uncontrolled nausea/vomiting, uncontrolled pain, dizziness, unable to urinate, bloody urine, chest pain, shortness of breath, leg swelling, leg pain, redness around wound, drainage from wound, or any other concerns or questions.   You can reach [place] at [phone].   FOLLOW-UP:  1. You have an appointment in 6 weeks with a ultrasound of your kidneys prior. 2. You have a string attached to your stent, you may remove it on Wednesday, December 6th. To do this, pull the strings until the stents are completely removed. You may feel an odd sensation in your back.   Moderate Conscious Sedation, Adult, Care After These instructions provide you with information about caring for yourself after your procedure. Your health care provider may also give you more specific  instructions. Your treatment has been planned according to current medical practices, but problems sometimes occur. Call your health care provider if you have any problems or questions after your procedure. What can I expect after the procedure? After your procedure, it is common:  To feel sleepy for several hours.  To feel clumsy and have poor balance for several hours.  To have poor judgment for several hours.  To vomit if you eat too soon. Follow these instructions at home: For at least 24 hours after the procedure:   Do not:  Participate in activities where you could fall or become injured.  Drive.  Use heavy machinery.  Drink alcohol.  Take sleeping pills or medicines that cause drowsiness.  Make important decisions or sign legal documents.  Take care of children on your own.  Rest. Eating and drinking  Follow the diet recommended by your health care provider.  If you vomit:  Drink water, juice, or soup when you can drink without vomiting.  Make sure you have little or no nausea before eating solid foods. General instructions  Have a responsible adult stay with you until you are awake and alert.  Take over-the-counter and prescription medicines only as told by your health care provider.  If you smoke, do not smoke without supervision.  Keep all follow-up visits as told by your health care provider. This is important. Contact a health care provider if:  You keep feeling nauseous or you keep  vomiting.  You feel light-headed.  You develop a rash.  You have a fever. Get help right away if:  You have trouble breathing. This information is not intended to replace advice given to you by your health care provider. Make sure you discuss any questions you have with your health care provider. Document Released: 10/17/2012 Document Revised: 06/01/2015 Document Reviewed: 04/18/2015 Elsevier Interactive Patient Education  2017 [employer]

## 2015-12-11 NOTE — Op Note (Signed)
Preoperative diagnosis: right ureteral calculus  Postoperative diagnosis: right ureteral calculus, and intrarenal stones  Procedure:  1. Cystoscopy 2. right ureteroscopy and stone removal 3. Ureteroscopic laser lithotripsy 4. right 80F x 24cm ureteral stent exchange 5. right retrograde pyelography with interpretation  Surgeon: Crist FatBenjamin W. Chemika Nightengale, MD  Anesthesia: General  Complications: None  Intraoperative findings: Right retrograde pyelography demonstrated a filling defect within the right proximal ureter consistent with the patient's known calculus without other abnormalities.  EBL: Minimal  Specimens: 1. right ureteral calculus  Disposition of specimens:  [facility] for stone analysis  Indication: Caroline [name] is a 70 y.o.   patient with a right ureteral stone and associated right symptoms. She is previously stented for concerns of urosepsis. After reviewing the management options for treatment, the patient elected to proceed with the above surgical procedure(s). We have discussed the potential benefits and risks of the procedure, side effects of the proposed treatment, the likelihood of the patient achieving the goals of the procedure, and any potential problems that might occur during the procedure or recuperation. Informed consent has been obtained.  Description of procedure:  The patient was taken to the operating room and general anesthesia was induced.  The patient was placed in the dorsal lithotomy position, prepped and draped in the usual sterile fashion, and preoperative antibiotics were administered. A preoperative time-out was performed.   Cystourethroscopy was performed.  The patient's urethra was examined and was normal.  The bladder was then systematically examined in its entirety. There was no evidence for any bladder tumors, stones, or other mucosal pathology.    The patient's stent emanating from her right ureteral orifice was then grasped  with the tip and brought to the urethral meatus. Stent was then wired with a 0.038 sensor wire which was advanced up into the right renal pelvis and the stent removed over the wire. The wire was then exchanged for a 5 [place] open-ended ureteral catheter.  Omnipaque contrast was injected through the ureteral catheter and a retrograde pyelogram was performed with findings as dictated above.  The 0.038 sensor wire was then exchanged again for the 5 French ureteral catheter.  The 6 Fr semirigid ureteroscope was then advanced into the ureter next to the guidewire and the calculus was identified.  Using an engage basket I was able to remove the 2 stones in the patient's distal ureter without laser fragmentation. I then advanced a second wire through the semirigid ureteroscope and advanced it into the right renal pelvis removing the scope with a wire. I then advanced a 12/14 [place] times 38 cm ureteral access sheath over the second wire and into the proximal right ureter. I removed the inner portion of the access sheath as well as the wire.  I then performed pyeloscopy with a flexible ureteroscope encountering a bedside stone in the lower pole. There were no additional stone fragments within the collecting system. I then used an N-gage nitinol basket to replace or relocate the stone into the upper pole. We then used a 200  fiber and fragmented the stone into 3 small pieces. I then was able to remove the small pieces using the N-gage nitinol basket through the access sheath.  I then injected additional contrast through the ureteroscope and performed fluoroscopic guided pyeloscopy to ensure there were no additional stones or stone fragments within the collecting system. I then slowly backed out the ureteroscope removing the accessory simultaneously and inspecting the ureter. There were no additional findings in the ureter  or associated trauma.  I then advanced a 6 [place] times 24 cm double-J ureteral stent over the  wire under fluoroscopic guidance advancing it to the urethral meatus noting this proximal end in the upper pole. I then with gentle pressure on the pubic bone removed the wire. The distal end of the stent curled into the patient's bladder as confirmed under fluoroscopic guidance. I then emptied the patient's bladder. String was tucked into the patient's vagina.  The wire was then backloaded through the cystoscope and a ureteral stent was advance over the wire using Seldinger technique.  The stent was positioned appropriately under fluoroscopic and cystoscopic guidance.  The wire was then removed with an adequate stent curl noted in the renal pelvis as well as in the bladder.  The bladder was then emptied and the procedure ended.  The patient appeared to tolerate the procedure well and without complications.  The patient was able to be awakened and transferred to the recovery unit in satisfactory condition.   Disposition: The tether of the stent was left on tucked inside the patient's vagina.  Instructions for removing the stent have been provided to the patient. The patient has been scheduled for followup in 6 weeks with a renal ultrasound.

## 2015-12-11 NOTE — Transfer of Care (Signed)
Immediate Anesthesia Transfer of Care Note  Patient: Caroline [name]  Procedure(s) Performed: Procedure(s): RIGHT URETEROSCOPY/HOLMIUM LASER LITHOTRIPSY AND STONE REMOVAL removal and placement of double j stent (Right) HOLMIUM LASER APPLICATION (Right)  Patient Location: PACU  Anesthesia Type:General  Level of Consciousness:  sedated, patient cooperative and responds to stimulation  Airway & Oxygen Therapy:Patient Spontanous Breathing and Patient connected to face mask oxgen  Post-op Assessment:  Report given to PACU RN and Post -op Vital signs reviewed and stable  Post vital signs:  Reviewed and stable  Last Vitals:  Vitals:   12/11/15 0542  BP: 133/72  Pulse: 99  Resp: 18  Temp: 36.8 C    Complications: No apparent anesthesia complications

## 2015-12-11 NOTE — Anesthesia Procedure Notes (Signed)
Procedure Name: Intubation Date/Time: 12/11/2015 7:41 AM Performed by: Jhonnie Garner Pre-anesthesia Checklist: Patient identified, Emergency Drugs available, Suction available and Patient being monitored Patient Re-evaluated:Patient Re-evaluated prior to inductionOxygen Delivery Method: Circle system utilized Preoxygenation: Pre-oxygenation with 100% oxygen Intubation Type: IV induction Ventilation: Mask ventilation without difficulty Laryngoscope Size: Mac and 3 (Initial with Miller 2, then Mac 3) Grade View: Grade II Tube type: Oral Tube size: 7.5 mm Number of attempts: 2 (Attempted placement of LMA without good success, withdrawn and inital intubation noted to be esophageal, tube removed and ETT placed on second attempt) Airway Equipment and Method: Stylet Placement Confirmation: ETT inserted through vocal cords under direct vision,  positive ETCO2 and breath sounds checked- equal and bilateral Secured at: 22 cm Tube secured with: Tape Dental Injury: Teeth and Oropharynx as per pre-operative assessment

## 2015-12-11 NOTE — Anesthesia Preprocedure Evaluation (Addendum)
Anesthesia Evaluation  Patient identified by MRN, date of birth, ID band Patient awake    Reviewed: Allergy & Precautions, NPO status , Patient's Chart, lab work & pertinent test results  History of Anesthesia Complications Negative for: history of anesthetic complications  Airway Mallampati: II  TM Distance: >3 FB Neck ROM: Full    Dental  (+) Edentulous Upper, Poor Dentition, Dental Advisory Given,    Pulmonary neg shortness of breath, asthma (used inhaler last night) , neg sleep apnea, neg COPD, neg recent URI, former smoker,    breath sounds clear to auscultation       Cardiovascular hypertension, Pt. on medications and Pt. on home beta blockers + angina (patient describes sharp pain with exertion that is relieved with Tylenol) +CHF (evidence on CXR of CHF) and + DOE (sinus tachycardia, RBBB, LAFB)  (-) Past MI and (-) Cardiac Stents  Rhythm:Regular Rate:Normal     Neuro/Psych vertigo    GI/Hepatic Neg liver ROS, GERD  Medicated and Controlled,  Endo/Other  diabetes, Type obesity  Renal/GU Renal disease (secondary to obstructing stone causing right hydronephrosis, Cr 1.24)     Musculoskeletal   Abdominal (+) + obese,   Peds  Hematology  (+) Blood dyscrasia (unprovoked PE in 2002, on coumadin, INR 1.65), anemia ,   Anesthesia Other Findings HLD  Reproductive/Obstetrics                            Anesthesia Physical Anesthesia Plan  ASA: III  Anesthesia Plan: General   Post-op Pain Management:    Induction: Intravenous  Airway Management Planned: LMA  Additional Equipment: None  Intra-op Plan:   Post-operative Plan: Extubation in OR  Informed Consent: I have reviewed the patients History and Physical, chart, labs and discussed the procedure including the risks, benefits and alternatives for the proposed anesthesia with the patient or authorized representative who has  indicated his/her understanding and acceptance.   Dental advisory given  Plan Discussed with: CRNA and Surgeon  Anesthesia Plan Comments:         Anesthesia Quick Evaluation

## 2015-12-12 DIAGNOSIS — E785 Hyperlipidemia, unspecified: Secondary | ICD-10-CM | POA: Diagnosis not present

## 2015-12-12 DIAGNOSIS — Z5181 Encounter for therapeutic drug level monitoring: Secondary | ICD-10-CM | POA: Diagnosis not present

## 2015-12-12 DIAGNOSIS — D649 Anemia, unspecified: Secondary | ICD-10-CM | POA: Diagnosis not present

## 2015-12-12 DIAGNOSIS — I509 Heart failure, unspecified: Secondary | ICD-10-CM | POA: Diagnosis not present

## 2015-12-12 DIAGNOSIS — Z48816 Encounter for surgical aftercare following surgery on the genitourinary system: Secondary | ICD-10-CM | POA: Diagnosis not present

## 2015-12-12 DIAGNOSIS — D759 Disease of blood and blood-forming organs, unspecified: Secondary | ICD-10-CM | POA: Diagnosis not present

## 2015-12-12 DIAGNOSIS — E119 Type 2 diabetes mellitus without complications: Secondary | ICD-10-CM | POA: Diagnosis not present

## 2015-12-12 DIAGNOSIS — J4530 Mild persistent asthma, uncomplicated: Secondary | ICD-10-CM | POA: Diagnosis not present

## 2015-12-12 DIAGNOSIS — I110 Hypertensive heart disease with heart failure: Secondary | ICD-10-CM | POA: Diagnosis not present

## 2015-12-12 DIAGNOSIS — Z7901 Long term (current) use of anticoagulants: Secondary | ICD-10-CM | POA: Diagnosis not present

## 2015-12-12 DIAGNOSIS — Z79891 Long term (current) use of opiate analgesic: Secondary | ICD-10-CM | POA: Diagnosis not present

## 2015-12-13 DIAGNOSIS — Z5181 Encounter for therapeutic drug level monitoring: Secondary | ICD-10-CM | POA: Diagnosis not present

## 2015-12-13 DIAGNOSIS — I110 Hypertensive heart disease with heart failure: Secondary | ICD-10-CM | POA: Diagnosis not present

## 2015-12-13 DIAGNOSIS — I509 Heart failure, unspecified: Secondary | ICD-10-CM | POA: Diagnosis not present

## 2015-12-13 DIAGNOSIS — D649 Anemia, unspecified: Secondary | ICD-10-CM | POA: Diagnosis not present

## 2015-12-13 DIAGNOSIS — E119 Type 2 diabetes mellitus without complications: Secondary | ICD-10-CM | POA: Diagnosis not present

## 2015-12-13 DIAGNOSIS — D759 Disease of blood and blood-forming organs, unspecified: Secondary | ICD-10-CM | POA: Diagnosis not present

## 2015-12-13 DIAGNOSIS — J4530 Mild persistent asthma, uncomplicated: Secondary | ICD-10-CM | POA: Diagnosis not present

## 2015-12-13 DIAGNOSIS — Z79891 Long term (current) use of opiate analgesic: Secondary | ICD-10-CM | POA: Diagnosis not present

## 2015-12-13 DIAGNOSIS — Z7901 Long term (current) use of anticoagulants: Secondary | ICD-10-CM | POA: Diagnosis not present

## 2015-12-13 DIAGNOSIS — E785 Hyperlipidemia, unspecified: Secondary | ICD-10-CM | POA: Diagnosis not present

## 2015-12-13 DIAGNOSIS — Z48816 Encounter for surgical aftercare following surgery on the genitourinary system: Secondary | ICD-10-CM | POA: Diagnosis not present

## 2015-12-14 DIAGNOSIS — E785 Hyperlipidemia, unspecified: Secondary | ICD-10-CM | POA: Diagnosis not present

## 2015-12-14 DIAGNOSIS — Z7901 Long term (current) use of anticoagulants: Secondary | ICD-10-CM | POA: Diagnosis not present

## 2015-12-14 DIAGNOSIS — J4530 Mild persistent asthma, uncomplicated: Secondary | ICD-10-CM | POA: Diagnosis not present

## 2015-12-14 DIAGNOSIS — D649 Anemia, unspecified: Secondary | ICD-10-CM | POA: Diagnosis not present

## 2015-12-14 DIAGNOSIS — I110 Hypertensive heart disease with heart failure: Secondary | ICD-10-CM | POA: Diagnosis not present

## 2015-12-14 DIAGNOSIS — E119 Type 2 diabetes mellitus without complications: Secondary | ICD-10-CM | POA: Diagnosis not present

## 2015-12-14 DIAGNOSIS — I509 Heart failure, unspecified: Secondary | ICD-10-CM | POA: Diagnosis not present

## 2015-12-14 DIAGNOSIS — D759 Disease of blood and blood-forming organs, unspecified: Secondary | ICD-10-CM | POA: Diagnosis not present

## 2015-12-14 DIAGNOSIS — Z5181 Encounter for therapeutic drug level monitoring: Secondary | ICD-10-CM | POA: Diagnosis not present

## 2015-12-14 DIAGNOSIS — Z48816 Encounter for surgical aftercare following surgery on the genitourinary system: Secondary | ICD-10-CM | POA: Diagnosis not present

## 2015-12-14 DIAGNOSIS — Z79891 Long term (current) use of opiate analgesic: Secondary | ICD-10-CM | POA: Diagnosis not present

## 2015-12-16 ENCOUNTER — Telehealth: Payer: Self-pay | Admitting: Pharmacist

## 2015-12-16 NOTE — Telephone Encounter (Signed)
Was called by [facility]HA on Monday while at [facility] in [facility] was closed). INR 1.7 reported while on Lovenox bridge. I contacted the patient from the meeting and advised that she take 16.5mg  warfarin/WEEK. 3mg  tablet on M/T/W/F; 1.5mg  all other days. She will RTC on Monday 11-DEC-17 at 1000h.

## 2015-12-21 ENCOUNTER — Ambulatory Visit (INDEPENDENT_AMBULATORY_CARE_PROVIDER_SITE_OTHER): Payer: Medicare Other | Admitting: Pharmacist

## 2015-12-21 DIAGNOSIS — Z5181 Encounter for therapeutic drug level monitoring: Secondary | ICD-10-CM | POA: Diagnosis not present

## 2015-12-21 DIAGNOSIS — Z7901 Long term (current) use of anticoagulants: Secondary | ICD-10-CM

## 2015-12-21 DIAGNOSIS — Z48816 Encounter for surgical aftercare following surgery on the genitourinary system: Secondary | ICD-10-CM | POA: Diagnosis not present

## 2015-12-21 DIAGNOSIS — E785 Hyperlipidemia, unspecified: Secondary | ICD-10-CM | POA: Diagnosis not present

## 2015-12-21 DIAGNOSIS — I509 Heart failure, unspecified: Secondary | ICD-10-CM | POA: Diagnosis not present

## 2015-12-21 DIAGNOSIS — I110 Hypertensive heart disease with heart failure: Secondary | ICD-10-CM | POA: Diagnosis not present

## 2015-12-21 DIAGNOSIS — D759 Disease of blood and blood-forming organs, unspecified: Secondary | ICD-10-CM | POA: Diagnosis not present

## 2015-12-21 DIAGNOSIS — D649 Anemia, unspecified: Secondary | ICD-10-CM | POA: Diagnosis not present

## 2015-12-21 DIAGNOSIS — E119 Type 2 diabetes mellitus without complications: Secondary | ICD-10-CM | POA: Diagnosis not present

## 2015-12-21 DIAGNOSIS — Z79891 Long term (current) use of opiate analgesic: Secondary | ICD-10-CM | POA: Diagnosis not present

## 2015-12-21 DIAGNOSIS — J4530 Mild persistent asthma, uncomplicated: Secondary | ICD-10-CM | POA: Diagnosis not present

## 2015-12-21 LAB — POCT INR: INR: 2

## 2015-12-21 MED ORDER — WARFARIN SODIUM 4 MG PO TABS: ORAL_TABLET | ORAL | 2 refills | Status: DC

## 2015-12-21 NOTE — Progress Notes (Signed)
Anti-Coagulation Progress Note  Caroline [name] is a 70 y.o. female who is currently on an anti-coagulation regimen.    RECENT RESULTS: Recent results are below, the most recent result is correlated with a dose of HAVING BEEN ON A LMWH+warfarin bridge therapy after procedure.  Lab Results  Component Value Date   INR 2.0 12/21/2015   INR 1.66 12/11/2015   INR 2.2 12/07/2015    ANTI-COAG DOSE: Anticoagulation Dose Instructions as of 12/21/2015      [name] Tue Wed Thu Fri Sat   New Dose 0 mg 0 mg 0 mg 0 mg 0 mg 0 mg 2 mg    Description   Discard your 3mg  strength warfarin tablets. Commence using your 4mg  strength warfarin tablets that you have at home. Take 1 tablet on Mondays/Wednesdays/Fridays; all other days--take only 1/2 tablet.       ANTICOAG SUMMARY: Anticoagulation Episode Summary    Current INR goal:     TTR:   89.5 % (10.7 mo)  Next INR check:   12/28/2015  INR from last check:   2.0 (12/21/2015)  Weekly max dose:     Target end date:     INR check location:     Preferred lab:     Send INR reminders to:        Comments:           ANTICOAG TODAY: Anticoagulation Summary  As of 12/21/2015   INR goal:     TTR:     Today's INR:   2.0  Next INR check:   12/28/2015  Target end date:         Anticoagulation Episode Summary    INR check location:      Preferred lab:      Send INR reminders to:      Comments:         PATIENT INSTRUCTIONS: Patient instructed to take medications as defined in the Anti-coagulation Track section of this encounter.  Patient instructed to taketoday's dose.  Patient instructed to discard her 3mg  strength warfarin tablets.  Patient instructed to begin using her 4mg  strength warfarin tablets. Patient instructed to take 1 tablet on Mondays/Wednesdays/Fridays; all other days---take only 1/2 of your 4mg  strength warfarin tablets. Patient verbalized understanding of these instructions.     FOLLOW-UP Return in 7 days (on 12/28/2015)  for Follow up INR at 1100h.  Hulen Luster, III Pharm.D., CACP

## 2015-12-21 NOTE — Patient Instructions (Signed)
Patient instructed to take medications as defined in the Anti-coagulation Track section of this encounter.  Patient instructed to taketoday's dose.  Patient instructed to discard her 3mg  strength warfarin tablets.  Patient instructed to begin using her 4mg  strength warfarin tablets. Patient instructed to take 1 tablet on Mondays/Wednesdays/Fridays; all other days---take only 1/2 of your 4mg  strength warfarin tablets. Patient verbalized understanding of these instructions.

## 2015-12-28 ENCOUNTER — Ambulatory Visit (INDEPENDENT_AMBULATORY_CARE_PROVIDER_SITE_OTHER): Payer: Medicare Other | Admitting: Pharmacist

## 2015-12-28 ENCOUNTER — Other Ambulatory Visit: Payer: Self-pay

## 2015-12-28 DIAGNOSIS — Z7901 Long term (current) use of anticoagulants: Secondary | ICD-10-CM | POA: Diagnosis not present

## 2015-12-28 DIAGNOSIS — Z86718 Personal history of other venous thrombosis and embolism: Secondary | ICD-10-CM

## 2015-12-28 LAB — POCT INR: INR: 2

## 2015-12-28 NOTE — Patient Instructions (Signed)
Patient instructed to take medications as defined in the Anti-coagulation Track section of this encounter.  Patient instructed to take today's dose.  Patient instructed to take 1 tablet of your 4mg  strength blue warfarin tablets on Sundays, Tuesdays, Thursdays and Saturdays. On Mondays, Wednesdays and Fridays--take only 1/2 tablet of your 4mg  strength blue warfarin tablet.  Patient verbalized understanding of these instructions.

## 2015-12-28 NOTE — Progress Notes (Signed)
INTERNAL MEDICINE TEACHING ATTENDING ADDENDUM - Garreth Burnsworth M.D  Duration- indefinite, Indication- recurrent DVT, INR- therapeutic. Agree with pharmacy recommendations as outlined in their note.     

## 2015-12-28 NOTE — Progress Notes (Signed)
Anti-Coagulation Progress Note  Caroline [name] is a 70 y.o. female who is currently on an anti-coagulation regimen.    RECENT RESULTS: Recent results are below, the most recent result is correlated with a dose of 20 mg. per week: Lab Results  Component Value Date   INR 2.00 12/28/2015   INR 2.0 12/21/2015   INR 1.66 12/11/2015    ANTI-COAG DOSE: Anticoagulation Dose Instructions as of 12/28/2015      [name] Tue Wed Thu Fri Sat   New Dose 0 mg 0 mg 0 mg 0 mg 0 mg 0 mg 2 mg    Description   Take 1/2 tablet of your 4mg  strength warfarin tablet by mouth on Sundays, Tuesdays, Thursdays and Saturdays; On Mondays, Wednesdays and Fridays--take only 1/2 tablet.       ANTICOAG SUMMARY: Anticoagulation Episode Summary    Current INR goal:     TTR:   89.7 % (10.9 mo)  Next INR check:   01/18/2016  INR from last check:   2.00 (12/28/2015)  Weekly max dose:     Target end date:     INR check location:     Preferred lab:     Send INR reminders to:        Comments:           ANTICOAG TODAY: Anticoagulation Summary  As of 12/28/2015   INR goal:     TTR:     Today's INR:   2.00  Next INR check:   01/18/2016  Target end date:         Anticoagulation Episode Summary    INR check location:      Preferred lab:      Send INR reminders to:      Comments:         PATIENT INSTRUCTIONS: Patient instructed to take medications as defined in the Anti-coagulation Track section of this encounter.  Patient instructed to take today's dose.  Patient instructed to take 1 tablet of your 4mg  strength blue warfarin tablets on Sundays, Tuesdays, Thursdays and Saturdays. On Mondays, Wednesdays and Fridays--take only 1/2 tablet of your 4mg  strength blue warfarin tablet.  Patient verbalized understanding of these instructions.     FOLLOW-UP Return in 3 weeks (on 01/18/2016) for Follow up INR at 1100h.  Hulen Luster, III Pharm.D., CACP

## 2015-12-29 DIAGNOSIS — I110 Hypertensive heart disease with heart failure: Secondary | ICD-10-CM | POA: Diagnosis not present

## 2015-12-29 DIAGNOSIS — Z7901 Long term (current) use of anticoagulants: Secondary | ICD-10-CM | POA: Diagnosis not present

## 2015-12-29 DIAGNOSIS — E119 Type 2 diabetes mellitus without complications: Secondary | ICD-10-CM | POA: Diagnosis not present

## 2015-12-29 DIAGNOSIS — Z79891 Long term (current) use of opiate analgesic: Secondary | ICD-10-CM | POA: Diagnosis not present

## 2015-12-29 DIAGNOSIS — Z5181 Encounter for therapeutic drug level monitoring: Secondary | ICD-10-CM | POA: Diagnosis not present

## 2015-12-29 DIAGNOSIS — D759 Disease of blood and blood-forming organs, unspecified: Secondary | ICD-10-CM | POA: Diagnosis not present

## 2015-12-29 DIAGNOSIS — E785 Hyperlipidemia, unspecified: Secondary | ICD-10-CM | POA: Diagnosis not present

## 2015-12-29 DIAGNOSIS — I509 Heart failure, unspecified: Secondary | ICD-10-CM | POA: Diagnosis not present

## 2015-12-29 DIAGNOSIS — J4530 Mild persistent asthma, uncomplicated: Secondary | ICD-10-CM | POA: Diagnosis not present

## 2015-12-29 DIAGNOSIS — Z48816 Encounter for surgical aftercare following surgery on the genitourinary system: Secondary | ICD-10-CM | POA: Diagnosis not present

## 2015-12-29 DIAGNOSIS — D649 Anemia, unspecified: Secondary | ICD-10-CM | POA: Diagnosis not present

## 2016-01-01 DIAGNOSIS — E119 Type 2 diabetes mellitus without complications: Secondary | ICD-10-CM | POA: Diagnosis not present

## 2016-01-01 DIAGNOSIS — I110 Hypertensive heart disease with heart failure: Secondary | ICD-10-CM | POA: Diagnosis not present

## 2016-01-01 DIAGNOSIS — Z5181 Encounter for therapeutic drug level monitoring: Secondary | ICD-10-CM | POA: Diagnosis not present

## 2016-01-01 DIAGNOSIS — Z79891 Long term (current) use of opiate analgesic: Secondary | ICD-10-CM | POA: Diagnosis not present

## 2016-01-01 DIAGNOSIS — I509 Heart failure, unspecified: Secondary | ICD-10-CM | POA: Diagnosis not present

## 2016-01-01 DIAGNOSIS — D759 Disease of blood and blood-forming organs, unspecified: Secondary | ICD-10-CM | POA: Diagnosis not present

## 2016-01-01 DIAGNOSIS — Z48816 Encounter for surgical aftercare following surgery on the genitourinary system: Secondary | ICD-10-CM | POA: Diagnosis not present

## 2016-01-01 DIAGNOSIS — D649 Anemia, unspecified: Secondary | ICD-10-CM | POA: Diagnosis not present

## 2016-01-01 DIAGNOSIS — E785 Hyperlipidemia, unspecified: Secondary | ICD-10-CM | POA: Diagnosis not present

## 2016-01-01 DIAGNOSIS — J4530 Mild persistent asthma, uncomplicated: Secondary | ICD-10-CM | POA: Diagnosis not present

## 2016-01-01 DIAGNOSIS — Z7901 Long term (current) use of anticoagulants: Secondary | ICD-10-CM | POA: Diagnosis not present

## 2016-01-18 ENCOUNTER — Ambulatory Visit (INDEPENDENT_AMBULATORY_CARE_PROVIDER_SITE_OTHER): Payer: Medicare Other | Admitting: Pharmacist

## 2016-01-18 DIAGNOSIS — Z86718 Personal history of other venous thrombosis and embolism: Secondary | ICD-10-CM

## 2016-01-18 DIAGNOSIS — Z7901 Long term (current) use of anticoagulants: Secondary | ICD-10-CM

## 2016-01-18 LAB — POCT INR: INR: 2.4

## 2016-01-18 NOTE — Progress Notes (Signed)
Anti-Coagulation Progress Note  Caroline [name] is a 71 y.o. female who is currently on an anti-coagulation regimen.    RECENT RESULTS: Recent results are below, the most recent result is correlated with a dose of 22 mg. per week: Lab Results  Component Value Date   INR 2.40 01/18/2016   INR 2.00 12/28/2015   INR 2.0 12/21/2015    ANTI-COAG DOSE: Anticoagulation Dose Instructions as of 01/18/2016      [name] Tue Wed Thu Fri Sat   New Dose 4 mg 2 mg 4 mg 2 mg 4 mg 2 mg 4 mg    Description   Take 1/2 tablet of your 4mg  strength warfarin tablet by mouth on Mondays, Wednesdays and Fridays; all other days--take 1 tablet.       ANTICOAG SUMMARY: Anticoagulation Episode Summary    Current INR goal:     TTR:   90.3 % (11.6 mo)  Next INR check:   02/15/2016  INR from last check:   2.40 (01/18/2016)  Weekly max dose:     Target end date:     INR check location:     Preferred lab:     Send INR reminders to:        Comments:           ANTICOAG TODAY: Anticoagulation Summary  As of 01/18/2016   INR goal:     TTR:     Today's INR:   2.40  Next INR check:   02/15/2016  Target end date:         Anticoagulation Episode Summary    INR check location:      Preferred lab:      Send INR reminders to:      Comments:         PATIENT INSTRUCTIONS: Patient instructed to take medications as defined in the Anti-coagulation Track section of this encounter.  Patient instructed to take today's dose.  Patient instructed to take 1 of your 4mg  strength blue warfarin tablets by mouth once-daily at [facility] on Sundays, Tuesdays, Thursdays and Saturdays---on Mondays, Wednesdays and Fridays--take only 1/2 tablet.  Patient verbalized understanding of these instructions.     FOLLOW-UP Return in 4 weeks (on 02/15/2016) for Follow up INR at 1045h.  Hulen Luster, III Pharm.D., CACP

## 2016-01-18 NOTE — Patient Instructions (Signed)
Patient instructed to take medications as defined in the Anti-coagulation Track section of this encounter.  Patient instructed to take today's dose.  Patient instructed to take 1 of your 4mg  strength blue warfarin tablets by mouth once-daily at [facility] on Sundays, Tuesdays, Thursdays and Saturdays---on Mondays, Wednesdays and Fridays--take only 1/2 tablet.  Patient verbalized understanding of these instructions.

## 2016-01-18 NOTE — Progress Notes (Signed)
INTERNAL MEDICINE TEACHING ATTENDING ADDENDUM - Gust Rung, DO Duration- indefinate, Indication- recurrent vte, INR-  Lab Results  Component Value Date   INR 2.40 01/18/2016  . Agree with pharmacy recommendations as outlined in their note.

## 2016-01-22 ENCOUNTER — Other Ambulatory Visit: Payer: Self-pay | Admitting: Internal Medicine

## 2016-01-25 ENCOUNTER — Ambulatory Visit (INDEPENDENT_AMBULATORY_CARE_PROVIDER_SITE_OTHER): Payer: Medicare Other | Admitting: Cardiology

## 2016-01-25 ENCOUNTER — Encounter: Payer: Self-pay | Admitting: Cardiology

## 2016-01-25 VITALS — BP 158/90 | HR 76 | Ht 63.0 in | Wt 257.2 lb

## 2016-01-25 DIAGNOSIS — N202 Calculus of kidney with calculus of ureter: Secondary | ICD-10-CM | POA: Diagnosis not present

## 2016-01-25 DIAGNOSIS — Z79899 Other long term (current) drug therapy: Secondary | ICD-10-CM

## 2016-01-25 MED ORDER — LOSARTAN POTASSIUM 50 MG PO TABS: 50.0000 mg | ORAL_TABLET | Freq: Every day | ORAL | 3 refills | Status: DC

## 2016-01-25 NOTE — Patient Instructions (Addendum)
Medication Instructions:   Your physician has recommended you make the following change in your medication:  1) INCREASE Cozaar to 50 mg once daily  (a new prescription for 50 mg tablets sent to pharmacy)  --- If you need a refill on your cardiac medications before your next appointment, please call your pharmacy. ---  Labwork:  Today: BMET   Your physician recommends that you return for lab work in: couple weeks for BMET  Testing/Procedures:  None ordered  Follow-Up:  Your physician wants you to follow-up in: 6 months with Dr. Elberta Fortis.  You will receive a reminder letter in the mail two months in advance. If you don't receive a letter, please call our office to schedule the follow-up appointment.  Thank you for choosing [facility]!!   Dory Horn, RN [phone]

## 2016-01-25 NOTE — Progress Notes (Signed)
Electrophysiology Office Note   Date:  01/25/2016   ID:  Caroline [name], DOB Aug 02, 1945, MRN [id number]  PCP:  Rich Number, MD  Cardiologist:  Regan Lemming, MD    Chief Complaint  Patient presents with  . Follow-up    Congestive heart failure      History of Present Illness: Caroline [name] is a 71 y.o. female who presents today for electrophysiology evaluation.   S/p hospital admission from 09/16/15-09/23/15 with klebsiella pneumonia sepsis and right ureteral calculi. She was started on iv fluids and iv antibiotics. She underwent cystoscopy and right retrograde ureterogram and had right ureteral stent placed. She was followed by urology. She has PMH of pulmonary embolism, HTN, combined CHF.    Today, she denies symptoms of palpitations, chest pain,  orthopnea, PND, lower extremity edema, claudication, dizziness, presyncope, syncope, bleeding, or neurologic sequela. The patient is tolerating medications without difficulties. She has been feeling well since last being seen. Her son has just recently had surgery to relieve a bowel obstruction in [place]. This is caused her quite a bit of anxiety. Other than that she has no major complaints. She does say that she has some right-sided chest discomfort at night when she lies down it lasts only a few seconds. This feels like a fluttering sensation.   Past Medical History:  Diagnosis Date  . Arthritis   . Asthma   . Bronchitis   . CHF (congestive heart failure) (HCC)   . DVT, lower extremity, recurrent (HCC)    Patient had unprovoked PE on 2002 and DVT in right lower extremety 2008.  [name] GERD (gastroesophageal reflux disease)   . History of kidney stones   . Hypertension   . PE (pulmonary embolism)    Patient had unprovoked PE on 2002  . PONV (postoperative nausea and vomiting)   . Vertigo    Past Surgical History:  Procedure Laterality Date  . CYSTOSCOPY W/ URETERAL STENT PLACEMENT Right 09/16/2015   Procedure: CYSTOSCOPY WITH  RETROGRADE PYELOGRAM/URETERAL STENT PLACEMENT;  Surgeon: Alfredo Martinez, MD;  Location: [facility] OR;  Service: Urology;  Laterality: Right;  . CYSTOSCOPY WITH RETROGRADE PYELOGRAM, URETEROSCOPY AND STENT PLACEMENT Right 12/11/2015   Procedure: RIGHT URETEROSCOPY/HOLMIUM LASER LITHOTRIPSY AND STONE REMOVAL removal and placement of double j stent;  Surgeon: Crist Fat, MD;  Location: [facility];  Service: Urology;  Laterality: Right;  . HOLMIUM LASER APPLICATION Right 12/11/2015   Procedure: HOLMIUM LASER APPLICATION;  Surgeon: Crist Fat, MD;  Location: [facility];  Service: Urology;  Laterality: Right;     Current Outpatient Prescriptions  Medication Sig Dispense Refill  . acetaminophen (TYLENOL) 500 MG tablet Take 500 mg by mouth every 6 (six) hours as needed for mild pain or moderate pain. pain    . calcium citrate-vitamin D (CITRACAL+D) 315-200 MG-UNIT tablet Take 2 tablets by mouth daily. 100 tablet 3  . furosemide (LASIX) 40 MG tablet Take 1 tablet (40 mg total) by mouth every other day. 90 tablet 1  . meclizine (ANTIVERT) 25 MG tablet Take 1 tablet (25 mg total) by mouth 3 (three) times daily as needed. 270 tablet 1  . metoprolol succinate (TOPROL-XL) 50 MG 24 hr tablet Take 1 tablet (50 mg total) by mouth daily. Take with or immediately following a meal. 90 tablet 3  . montelukast (SINGULAIR) 10 MG tablet Take 1 tablet (10 mg total) by mouth at bedtime. 30 tablet 11  . pantoprazole (PROTONIX) 40 MG tablet Take 1 tablet (40 mg  total) by mouth daily. 90 tablet 1  . pravastatin (PRAVACHOL) 80 MG tablet Take 80 mg by mouth every evening.     [name] PROAIR HFA 108 (90 Base) MCG/ACT inhaler INHALE 2 PUFFS INTO THE LUNGS EVERY 6 (SIX) HOURS AS NEEDED FOR WHEEZING OR SHORTNESS OF BREATH. 8.5 Inhaler 2  . PROCTOSOL HC 2.5 % rectal cream PLACE 1 APPLICATION RECTALLY 2 TIMES DAILY. 28.35 g 5  . SYMBICORT 80-4.5 MCG/ACT inhaler USE 2 SPRAYS TWICE DAILY 10.2 Inhaler 5  . traMADol (ULTRAM) 50 MG tablet  Take 1-2 tablets (50-100 mg total) by mouth every 6 (six) hours as needed for moderate pain. 15 tablet 0  . warfarin (COUMADIN) 4 MG tablet Take 1 tablet on Mondays/Wednesdays/Fridays; all other days--take only 1/2 tablet. 20 tablet 2   No current facility-administered medications for this visit.     Allergies:   Penicillins; Cabbage; Shellfish allergy; Tomato; and Latex   Social History:  The patient  reports that she quit smoking about 27 years ago. Her smoking use included Cigarettes. She has never used smokeless tobacco. She reports that she does not drink alcohol or use drugs.   Family History:  The patient's family history includes Cancer in her mother; Colon cancer in her other; Diabetes in her brother; Heart Problems (age of onset: 58) in her other; Hypertension in her brother, sister, and sister.    ROS:  Please see the history of present illness.   Otherwise, review of systems is positive for Chest pain, leg swelling, leg pain, visual changes, cough, abdominal pain.   All other systems are reviewed and negative.    PHYSICAL EXAM: VS:  BP (!) 158/90   Pulse 76   Ht 5\' 3"  (1.6 m)   Wt 257 lb 3.2 oz (116.7 kg)   BMI 45.56 kg/m  , BMI Body mass index is 45.56 kg/m. GEN: Well nourished, well developed, in no acute distress  HEENT: normal  Neck: no JVD, carotid bruits, or masses Cardiac: RRR; no murmurs, rubs, or gallops,no edema  Respiratory:  clear to auscultation bilaterally, normal work of breathing GI: soft, nontender, nondistended, + BS MS: no deformity or atrophy  Skin: warm and dry Neuro:  Strength and sensation are intact Psych: euthymic mood, full affect  EKG:  EKG is ordered today. Personal review of the ekg ordered 10/28/15 shows sinus rhythm, right bundle branch block, left anterior fascicular block, rate 87  PAD Screen 10/28/2015  Previous PAD dx? No  Previous surgical procedure? Yes  Dates of procedures 09/16/15  Pain with walking? No  Feet/toe relief with  dangling? No  Painful, non-healing ulcers? No  Extremities discolored? No      Recent Labs: 09/16/2015: ALT 25 09/21/2015: B Natriuretic Peptide 251.8 11/27/2015: Hemoglobin 9.4; Platelets 307 12/07/2015: BUN 13; Creatinine, Ser 0.98; Potassium 3.7; Sodium 145    Lipid Panel     Component Value Date/Time   CHOL 194 04/08/2014 1353   TRIG 219 (H) 04/08/2014 1353   HDL 40 (L) 04/08/2014 1353   CHOLHDL 4.9 04/08/2014 1353   VLDL 44 (H) 04/08/2014 1353   LDLCALC 110 (H) 04/08/2014 1353     Wt Readings from Last 3 Encounters:  01/25/16 257 lb 3.2 oz (116.7 kg)  12/11/15 256 lb (116.1 kg)  12/07/15 256 lb 12.8 oz (116.5 kg)      Other studies Reviewed: Additional studies/ records that were reviewed today include: TTE 09/21/15  Review of the above records today demonstrates:  - Left ventricle: The  cavity size was normal. Systolic function was   mildly reduced. The estimated ejection fraction was in the range   of 45% to 50%. Wall motion was normal; there were no regional   wall motion abnormalities. Features are consistent with a   pseudonormal left ventricular filling pattern, with concomitant   abnormal relaxation and increased filling pressure (grade 2   diastolic dysfunction). - Left atrium: The atrium was moderately dilated.   ASSESSMENT AND PLAN:  1.  Chronic combined systolic and diastolic HF: Doing well today without any major issues. She does not have much in the way of volume overload, and she is on good medical therapy for her heart failure. Her blood pressure is a little bit elevated today and Moksha Dorgan therefore increase her Cozaar to 50 mg.  2. Hypertension: Sharon Seller today, increase Cozaar to 50 mg    Current medicines are reviewed at length with the patient today.   The patient does not have concerns regarding her medicines.  The following changes were made today:  Increase Cozaar  Labs/ tests ordered today include:  No orders of the defined types were placed in  this encounter.    Disposition:   FU with Aryah Doering 6 months  Signed, Calla Wedekind Jorja Loa, MD  01/25/2016 11:13 AM     [facility] [address] [place] [address] [phone] (office) [phone] (fax)

## 2016-01-26 LAB — BASIC METABOLIC PANEL
BUN/Creatinine Ratio: 11 — ABNORMAL LOW (ref 12–28)
BUN: 11 mg/dL (ref 8–27)
CO2: 28 mmol/L (ref 18–29)
Calcium: 9.4 mg/dL (ref 8.7–10.3)
Chloride: 102 mmol/L (ref 96–106)
Creatinine, Ser: 0.98 mg/dL (ref 0.57–1.00)
GFR calc Af Amer: 69 mL/min/{1.73_m2} (ref >59)
GFR, EST NON AFRICAN AMERICAN: 59 mL/min/{1.73_m2} — AB (ref >59)
GLUCOSE: 112 mg/dL — AB (ref 65–99)
POTASSIUM: 4 mmol/L (ref 3.5–5.2)
SODIUM: 144 mmol/L (ref 134–144)

## 2016-02-08 ENCOUNTER — Other Ambulatory Visit: Payer: Medicare Other | Admitting: *Deleted

## 2016-02-08 DIAGNOSIS — I10 Essential (primary) hypertension: Secondary | ICD-10-CM | POA: Diagnosis not present

## 2016-02-08 DIAGNOSIS — E7800 Pure hypercholesterolemia, unspecified: Secondary | ICD-10-CM | POA: Diagnosis not present

## 2016-02-09 LAB — BASIC METABOLIC PANEL
BUN/Creatinine Ratio: 17 (ref 12–28)
BUN: 15 mg/dL (ref 8–27)
CALCIUM: 9 mg/dL (ref 8.7–10.3)
CO2: 26 mmol/L (ref 18–29)
CREATININE: 0.88 mg/dL (ref 0.57–1.00)
Chloride: 100 mmol/L (ref 96–106)
GFR, EST AFRICAN AMERICAN: 78 mL/min/{1.73_m2} (ref >59)
GFR, EST NON AFRICAN AMERICAN: 68 mL/min/{1.73_m2} (ref >59)
Glucose: 117 mg/dL — ABNORMAL HIGH (ref 65–99)
Potassium: 3.8 mmol/L (ref 3.5–5.2)
Sodium: 142 mmol/L (ref 134–144)

## 2016-02-15 ENCOUNTER — Ambulatory Visit (INDEPENDENT_AMBULATORY_CARE_PROVIDER_SITE_OTHER): Payer: Medicare Other | Admitting: Pharmacist

## 2016-02-15 DIAGNOSIS — Z7901 Long term (current) use of anticoagulants: Secondary | ICD-10-CM

## 2016-02-15 DIAGNOSIS — I82509 Chronic embolism and thrombosis of unspecified deep veins of unspecified lower extremity: Secondary | ICD-10-CM

## 2016-02-15 DIAGNOSIS — I824Y9 Acute embolism and thrombosis of unspecified deep veins of unspecified proximal lower extremity: Secondary | ICD-10-CM

## 2016-02-15 LAB — POCT INR: INR: 1.7

## 2016-02-15 MED ORDER — WARFARIN SODIUM 4 MG PO TABS: ORAL_TABLET | ORAL | 2 refills | Status: DC

## 2016-02-15 NOTE — Progress Notes (Signed)
Anticoagulation Management Caroline KLINKHAMMER is a 71 y.o. female who reports to the clinic for monitoring of warfarin treatment.    Indication: DVT history, recurrent Duration: indefinite Supervising physician: Blanch Media  Anticoagulation Clinic Visit History: Patient does not report signs/symptoms of bleeding or thromboembolism. Patient does report an increase in losartan dose from 25 mg to 50 mg. However, this would have been expected to increase her INR. She reports no other changes.  Anticoagulation Episode Summary    Current INR goal:     TTR:   87.8 % (1 y)  Next INR check:   02/22/2016  INR from last check:   1.7 (02/15/2016)  Weekly max dose:     Target end date:     INR check location:     Preferred lab:     Send INR reminders to:        Comments:          ASSESSMENT Recent Results: The most recent result is correlated with 24 mg per week: Lab Results  Component Value Date   INR 1.7 02/15/2016   INR 2.40 01/18/2016   INR 2.00 12/28/2015   Anticoagulation Dosing: INR as of 02/15/2016 and Previous Dosing Information    INR Dt INR Goal [facility] Sun Mon Tue Wed Thu Fri Sat   02/15/2016 1.7 - 22 mg 4 mg 2 mg 4 mg 2 mg 4 mg 2 mg 4 mg    Previous description   Take 1/2 tablet of your 4mg  strength warfarin tablet by mouth on Mondays, Wednesdays and Fridays; all other days--take 1 tablet.    Anticoagulation Dose Instructions as of 02/15/2016      Total Sun Mon Tue Wed Thu Fri Sat   New Dose 24 mg 4 mg 4 mg 4 mg 2 mg 4 mg 2 mg 4 mg     (4 mg x 1)  (4 mg x 1)  (4 mg x 1)  (4 mg x 0.5)  (4 mg x 1)  (4 mg x 0.5)  (4 mg x 1)                           INR today: Subtherapeutic  PLAN Weekly dose was increased by 9% to 24 mg per week  Patient Instructions  Patient educated about medication as defined in this encounter and verbalized understanding by repeating back instructions provided.   Patient advised to contact clinic or seek medical attention if signs/symptoms  of bleeding or thromboembolism occur.  Patient verbalized understanding by repeating back information and was advised to contact me if further medication-related questions arise. Patient was also provided an information handout.  Follow-up Return in about 1 week (around 02/22/2016) for Follow up INR 02/22/16 around 11am.  Marzetta Board

## 2016-02-15 NOTE — Patient Instructions (Signed)
Patient educated about medication as defined in this encounter and verbalized understanding by repeating back instructions provided.   

## 2016-02-19 ENCOUNTER — Other Ambulatory Visit: Payer: Self-pay | Admitting: *Deleted

## 2016-02-19 DIAGNOSIS — J4530 Mild persistent asthma, uncomplicated: Secondary | ICD-10-CM

## 2016-02-19 MED ORDER — MONTELUKAST SODIUM 10 MG PO TABS: 10.0000 mg | ORAL_TABLET | Freq: Every day | ORAL | 2 refills | Status: DC

## 2016-02-22 ENCOUNTER — Ambulatory Visit (INDEPENDENT_AMBULATORY_CARE_PROVIDER_SITE_OTHER): Payer: Medicare Other | Admitting: Pharmacist

## 2016-02-22 DIAGNOSIS — Z86711 Personal history of pulmonary embolism: Secondary | ICD-10-CM

## 2016-02-22 DIAGNOSIS — Z7901 Long term (current) use of anticoagulants: Secondary | ICD-10-CM

## 2016-02-22 LAB — POCT INR: INR: 1.7

## 2016-02-22 NOTE — Progress Notes (Signed)
Anti-Coagulation Progress Note  NORMAL KUZMICH is a 71 y.o. female who is currently on an anti-coagulation regimen.    RECENT RESULTS: Recent results are below, the most recent result is correlated with a dose of 24 mg. per week: Lab Results  Component Value Date   INR 1.70 02/22/2016   INR 1.7 02/15/2016   INR 2.40 01/18/2016    ANTI-COAG DOSE: Anticoagulation Dose Instructions as of 02/22/2016      [name] Tue Wed Thu Fri Sat   New Dose 4 mg 4 mg 4 mg 4 mg 4 mg 4 mg 4 mg       ANTICOAG SUMMARY: Anticoagulation Episode Summary    Current INR goal:     TTR:   86.3 % (1 y)  Next INR check:   03/07/2016  INR from last check:   1.70 (02/22/2016)  Weekly max dose:     Target end date:     INR check location:     Preferred lab:     Send INR reminders to:        Comments:           ANTICOAG TODAY: Anticoagulation Summary  As of 02/22/2016   INR goal:     TTR:     Today's INR:   1.70  Next INR check:   03/07/2016  Target end date:         Anticoagulation Episode Summary    INR check location:      Preferred lab:      Send INR reminders to:      Comments:         PATIENT INSTRUCTIONS: Patient instructed to take medications as defined in the Anti-coagulation Track section of this encounter.  Patient instructed to take today's dose.  Patient instructed to take 1 tablet of your 4mg  strength blue warfarin tablets by mouth once-daily at [facility].  Patient verbalized understanding of these instructions.     FOLLOW-UP Return in 2 weeks (on 03/07/2016) for Follow up INR at 1015.  Hulen Luster, III Pharm.D., CACP

## 2016-02-22 NOTE — Patient Instructions (Signed)
Patient instructed to take medications as defined in the Anti-coagulation Track section of this encounter.  Patient instructed to take today's dose.  Patient instructed to take 1 tablet of your 4mg strength blue warfarin tablets by mouth once-daily at 6PM.  Patient verbalized understanding of these instructions.    

## 2016-02-23 NOTE — Progress Notes (Signed)
INTERNAL MEDICINE TEACHING ATTENDING ADDENDUM - Gust Rung, DO Duration- indefinate, Indication- recurrent VTE, INR-  Lab Results  Component Value Date   INR 1.70 02/22/2016  . Agree with pharmacy recommendations as outlined in their note.

## 2016-03-07 ENCOUNTER — Ambulatory Visit (INDEPENDENT_AMBULATORY_CARE_PROVIDER_SITE_OTHER): Payer: Medicare Other | Admitting: Pharmacist

## 2016-03-07 ENCOUNTER — Telehealth: Payer: Self-pay | Admitting: Internal Medicine

## 2016-03-07 DIAGNOSIS — I82409 Acute embolism and thrombosis of unspecified deep veins of unspecified lower extremity: Secondary | ICD-10-CM

## 2016-03-07 DIAGNOSIS — Z7901 Long term (current) use of anticoagulants: Secondary | ICD-10-CM

## 2016-03-07 DIAGNOSIS — Z86711 Personal history of pulmonary embolism: Secondary | ICD-10-CM

## 2016-03-07 LAB — POCT INR: INR: 2.5

## 2016-03-07 NOTE — Patient Instructions (Signed)
Patient instructed to take medications as defined in the Anti-coagulation Track section of this encounter.  Patient instructed to take today's dose.  Patient instructed to take  1 tablet of your blue-colored 4mg  strength warfarin tablets by mouth once-daily at [facility] each day.  Patient verbalized understanding of these instructions.

## 2016-03-07 NOTE — Progress Notes (Signed)
Anti-Coagulation Progress Note  Caroline [name] is a 71 y.o. female who is currently on an anti-coagulation regimen.    RECENT RESULTS: Recent results are below, the most recent result is correlated with a dose of 28 mg. per week: Lab Results  Component Value Date   INR 2.50 03/07/2016   INR 1.70 02/22/2016   INR 1.7 02/15/2016    ANTI-COAG DOSE: Anticoagulation Dose Instructions as of 03/07/2016      [name] Tue Wed Thu Fri Sat   New Dose 4 mg 4 mg 4 mg 4 mg 4 mg 4 mg 4 mg    Description   Take 1 tablet of your blue-colored 4mg  strength warfarin tablets by mouth once-daily at 6PM each day.       ANTICOAG SUMMARY: Anticoagulation Episode Summary    Current INR goal:   2.0-3.0  TTR:   85.5 % (1.1 y)  Next INR check:   04/04/2016  INR from last check:   2.50 (03/07/2016)  Weekly max dose:     Target end date:     INR check location:   [facility]  Preferred lab:     Send INR reminders to:      Indications   DVT lower extremity recurrent (HCC) [I82.409] PULMONARY EMBOLISM HX OF (Resolved) [Z86.718]       Comments:           ANTICOAG TODAY: Anticoagulation Summary  As of 03/07/2016   INR goal:   2.0-3.0  TTR:     Today's INR:   2.50  Next INR check:   04/04/2016  Target end date:      Indications   DVT lower extremity recurrent (HCC) [I82.409] PULMONARY EMBOLISM HX OF (Resolved) [Z86.718]        Anticoagulation Episode Summary    INR check location[facility]   [facility]   Preferred lab:      Send INR reminders to:      Comments:         PATIENT INSTRUCTIONS: Patient instructed to take medications as defined in the Anti-coagulation Track section of this encounter.  Patient instructed to take today's dose.  Patient instructed to take  1 tablet of your blue-colored 4mg  strength warfarin tablets by mouth once-daily at [facility] each day.  Patient verbalized understanding of these instructions.   FOLLOW-UP Return in 4 weeks (on 04/04/2016) for Follow up INR  at 1015h.  Hulen Luster, III Pharm.D., CACP

## 2016-03-07 NOTE — Progress Notes (Addendum)
   CC: Hypertension  HPI:  [name] is a 71 y.o. woman with PMHx as noted below who presents today for follow up of her hypertension.  HTN: BP 147/71 today. Repeat BP 143/60. She is taking Lasix 40 mg daily, Losartan 50 mg daily, and Toprol-XL 50 mg daily.  Mild Persistent Asthma: Reports symptoms of SOB and wheezing recently due to "the change in weather" and her allergies. She reports using her ProAir at least once a day, sometimes twice daily. She last used her ProAir this morning. She reports immediate relief of her symptoms. She notes usually her symptoms are worse at night. She uses Symbicort 2 puffs twice daily and Monteleukast 10 mg QHS as well.  Hx Exertional Chest Pain: Denies any chest pain, SOB, or palpitations. She was supposed to undergo a Myoview stress test in Oct 2017 as part of pre-op clearance but this was never performed. She reports her last episode of chest pain was in December and she has not had any additional episodes since that time.   Hyperlipidemia: Her last LDL was 110 in 2016. She is taking Pravastatin 80 mg daily. Denies any medication issues.  Vertigo: Reports taking Meclizine 25 mg twice daily as needed. She feels her vertigo is well controlled with this medication and allows her to do her daily activities without difficulty. She does note she has to sleep with several pillows to keep her head propped up as the vertigo occurs when laying completely flat.   Past Medical History:  Diagnosis Date  . Arthritis   . Asthma   . Bronchitis   . CHF (congestive heart failure) (HCC)   . DVT, lower extremity, recurrent (HCC)    Patient had unprovoked PE on 2002 and DVT in right lower extremety 2008.  [name] GERD (gastroesophageal reflux disease)   . History of kidney stones   . Hypertension   . PE (pulmonary embolism)    Patient had unprovoked PE on 2002  . PONV (postoperative nausea and vomiting)   . Vertigo     Review of Systems:   General: Denies fever,  chills, night sweats, changes in appetite HEENT: Denies headaches, ear pain, changes in vision, rhinorrhea, sore throat CV: See HPI Pulm: Denies cough GI: Denies abdominal pain, nausea, vomiting, diarrhea, constipation, melena, hematochezia GU: Denies dysuria, hematuria, frequency Msk: Denies muscle cramps, joint pains Neuro: Denies weakness, numbness, tingling Skin: Denies rashes, bruising Psych: Denies depression, anxiety, hallucinations  Physical Exam:  Vitals:   03/08/16 1312 03/08/16 1355  BP: (!) 147/71 (!) 143/60  Pulse: 73 72  Temp: 98.3 F (36.8 C)   TempSrc: Oral   SpO2: 100%   Weight: 257 lb 1.6 oz (116.6 kg)   Height: 5\' 3"  (1.6 m)    Repeat BP 143/60  General: Obese woman sitting up in chair, NAD HEENT: [place]/AT, EOMI, mucus membranes moist  CV: RRR, no m/g/r Pulm: CTA bilaterally, breaths non-labored Ext: warm, no peripheral edema Neuro: alert and oriented x 3  Assessment & Plan:   See Encounters Tab for problem based charting.  Patient discussed with Dr. Rogelia Boga

## 2016-03-07 NOTE — Telephone Encounter (Signed)
APT. REMINDER CALL, LMTCB °

## 2016-03-08 ENCOUNTER — Encounter: Payer: Self-pay | Admitting: Internal Medicine

## 2016-03-08 ENCOUNTER — Other Ambulatory Visit: Payer: Self-pay | Admitting: Internal Medicine

## 2016-03-08 ENCOUNTER — Ambulatory Visit (INDEPENDENT_AMBULATORY_CARE_PROVIDER_SITE_OTHER): Payer: Medicare Other | Admitting: Internal Medicine

## 2016-03-08 VITALS — BP 143/60 | HR 72 | Temp 98.3°F | Ht 63.0 in | Wt 257.1 lb

## 2016-03-08 DIAGNOSIS — Z87898 Personal history of other specified conditions: Secondary | ICD-10-CM | POA: Diagnosis not present

## 2016-03-08 DIAGNOSIS — E7800 Pure hypercholesterolemia, unspecified: Secondary | ICD-10-CM | POA: Diagnosis not present

## 2016-03-08 DIAGNOSIS — I10 Essential (primary) hypertension: Secondary | ICD-10-CM | POA: Diagnosis not present

## 2016-03-08 DIAGNOSIS — Z0000 Encounter for general adult medical examination without abnormal findings: Secondary | ICD-10-CM

## 2016-03-08 DIAGNOSIS — R42 Dizziness and giddiness: Secondary | ICD-10-CM

## 2016-03-08 DIAGNOSIS — E669 Obesity, unspecified: Secondary | ICD-10-CM

## 2016-03-08 DIAGNOSIS — J4540 Moderate persistent asthma, uncomplicated: Secondary | ICD-10-CM

## 2016-03-08 DIAGNOSIS — Z7951 Long term (current) use of inhaled steroids: Secondary | ICD-10-CM

## 2016-03-08 DIAGNOSIS — E785 Hyperlipidemia, unspecified: Secondary | ICD-10-CM | POA: Diagnosis not present

## 2016-03-08 DIAGNOSIS — E2839 Other primary ovarian failure: Secondary | ICD-10-CM

## 2016-03-08 DIAGNOSIS — Z1231 Encounter for screening mammogram for malignant neoplasm of breast: Secondary | ICD-10-CM

## 2016-03-08 DIAGNOSIS — Z79899 Other long term (current) drug therapy: Secondary | ICD-10-CM

## 2016-03-08 DIAGNOSIS — Z87891 Personal history of nicotine dependence: Secondary | ICD-10-CM

## 2016-03-08 DIAGNOSIS — Z1211 Encounter for screening for malignant neoplasm of colon: Secondary | ICD-10-CM

## 2016-03-08 MED ORDER — BUDESONIDE-FORMOTEROL FUMARATE 160-4.5 MCG/ACT IN AERO: 2.0000 | INHALATION_SPRAY | Freq: Two times a day (BID) | RESPIRATORY_TRACT | 12 refills | Status: DC

## 2016-03-08 MED ORDER — ALBUTEROL SULFATE HFA 108 (90 BASE) MCG/ACT IN AERS: INHALATION_SPRAY | RESPIRATORY_TRACT | 5 refills | Status: DC

## 2016-03-08 NOTE — Patient Instructions (Signed)
General Instructions: - Try higher dose of Symbicort- use the same, 2 puffs twice daily  - Refill made for ProAir - Please bring back stool cards, can drop off to lab - Mammogram will be scheduled - Blood work today to check cholesterol   Please bring your medicines with you each time you come to clinic.  Medicines may include prescription medications, over-the-counter medications, herbal remedies, eye drops, vitamins, or other pills.   Progress Toward Treatment Goals:  Treatment Goal 11/03/2014  Blood pressure unable to assess    Self Care Goals & Plans:  Self Care Goal 05/05/2015  Manage my medications take my medicines as prescribed; bring my medications to every visit; refill my medications on time  Eat healthy foods eat more vegetables; eat foods that are low in salt; eat baked foods instead of fried foods  Be physically active find an activity I enjoy  Meeting treatment goals -    No flowsheet data found.   Care Management & Community Referrals:  Referral 10/30/2012  Referrals made for care management support none needed

## 2016-03-09 LAB — LIPID PANEL
CHOL/HDL RATIO: 5 ratio — AB (ref 0.0–4.4)
Cholesterol, Total: 217 mg/dL — ABNORMAL HIGH (ref 100–199)
HDL: 43 mg/dL (ref >39)
LDL CALC: 135 mg/dL — AB (ref 0–99)
Triglycerides: 195 mg/dL — ABNORMAL HIGH (ref 0–149)
VLDL CHOLESTEROL CAL: 39 mg/dL (ref 5–40)

## 2016-03-09 MED ORDER — LOSARTAN POTASSIUM 100 MG PO TABS: 100.0000 mg | ORAL_TABLET | Freq: Every day | ORAL | 5 refills | Status: DC

## 2016-03-09 NOTE — Assessment & Plan Note (Signed)
Discussed switching to a higher intensity statin given her ASCVD risk score of 16.9%, but patient is hesitant as she has been on Pravastatin for many years. Will check a lipid panel today to evaluate her LDL. Will continue Pravastatin 80 mg daily.

## 2016-03-09 NOTE — Assessment & Plan Note (Signed)
-   Obtain stool cards. She will bring to next visit or drop off at lab earlier - Mammogram and DEXA scheduled

## 2016-03-09 NOTE — Assessment & Plan Note (Signed)
Denies any additional episodes of chest pain since December. I advised her to return to clinic promptly if her exertional chest pain reoccurs. Can consider Myoview if her symptoms do reoccur.

## 2016-03-09 NOTE — Assessment & Plan Note (Signed)
Stable. Continue Meclizine 25 mg TID PRN.

## 2016-03-09 NOTE — Progress Notes (Signed)
Internal Medicine Clinic Attending  Case discussed with Dr. Rivet soon after the resident saw the patient.  We reviewed the resident's history and exam and pertinent patient test results.  I agree with the assessment, diagnosis, and plan of care documented in the resident's note.  

## 2016-03-09 NOTE — Assessment & Plan Note (Signed)
Her symptoms have become more frequent over the last several weeks. Will increase her Symbicort strength to 160-4.5 mg to see if this improves her symptoms. We had a long discussion about how ProAir should only be used on a as-needed basis for symptoms of SOB and wheezing and that her asthma is not controlled if she is having to use ProAir daily. Will have her continue Monteleukast 10 mg QHS. Follow up in 2 months to reassess.

## 2016-03-09 NOTE — Assessment & Plan Note (Signed)
BP remains mildly elevated. Will increase her Losartan to 100 mg daily. Continue Lasix 40 mg daily and Toprol-XL 50 mg daily.

## 2016-03-12 NOTE — Progress Notes (Signed)
INTERNAL MEDICINE TEACHING ATTENDING ADDENDUM - Gust Rung, DO Duration- indefinate, Indication- recurrent VTE, INR-  Lab Results  Component Value Date   INR 2.50 03/07/2016  . Agree with pharmacy recommendations as outlined in their note.

## 2016-03-22 ENCOUNTER — Other Ambulatory Visit (INDEPENDENT_AMBULATORY_CARE_PROVIDER_SITE_OTHER): Payer: Medicare Other

## 2016-03-22 DIAGNOSIS — Z1211 Encounter for screening for malignant neoplasm of colon: Secondary | ICD-10-CM | POA: Diagnosis not present

## 2016-03-22 LAB — POC HEMOCCULT BLD/STL (HOME/3-CARD/SCREEN)
Card #3 Fecal Occult Blood, POC: NEGATIVE
FECAL OCCULT BLD: NEGATIVE
Fecal Occult Blood, POC: NEGATIVE

## 2016-03-26 IMAGING — DX DG CHEST 2V
2 series · 2 of 2 positions shown · non-contrast
Comparison: 07/01/2014

CLINICAL DATA: Productive yellow cough with chest wall pain.

EXAM:
CHEST  2 VIEW

[chest pa]
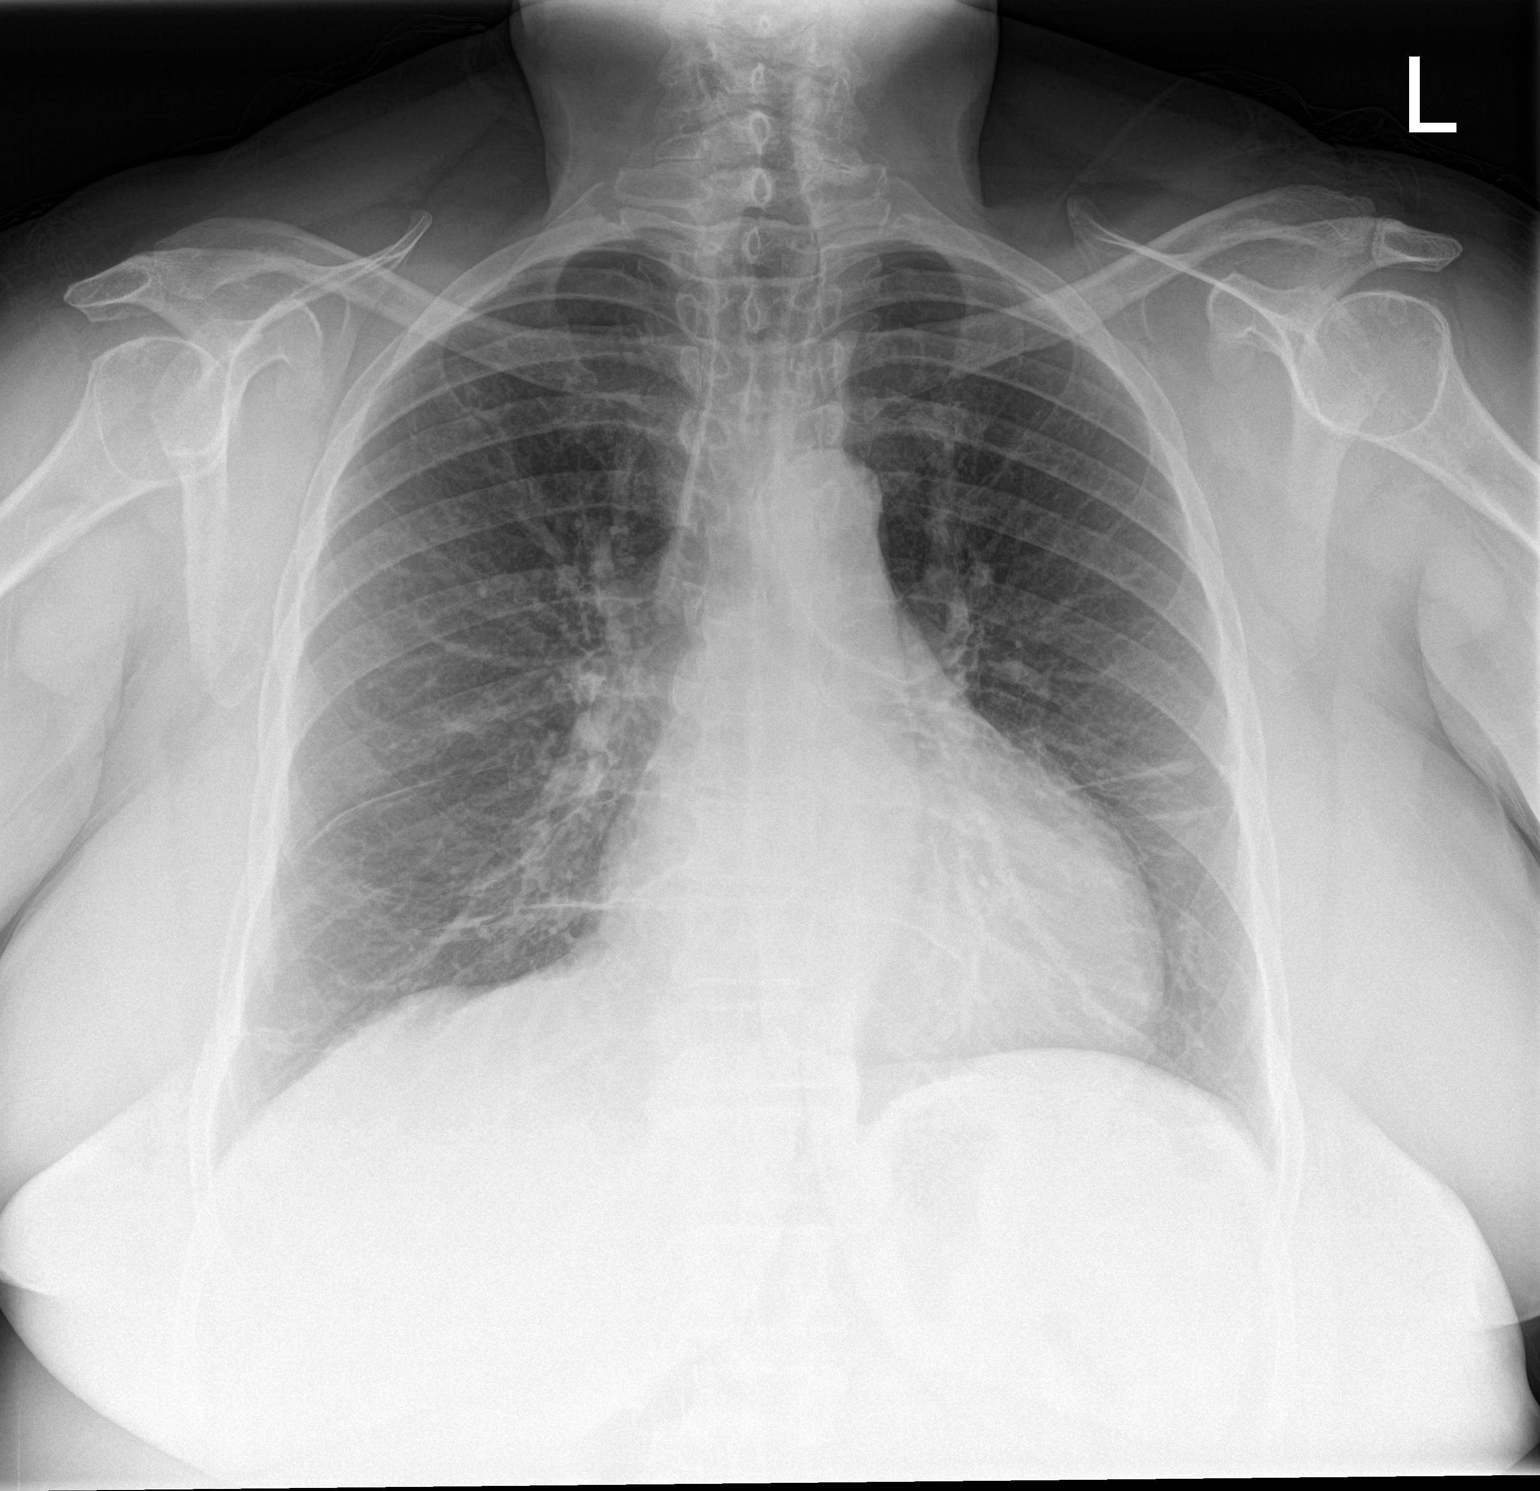

[chest lat]
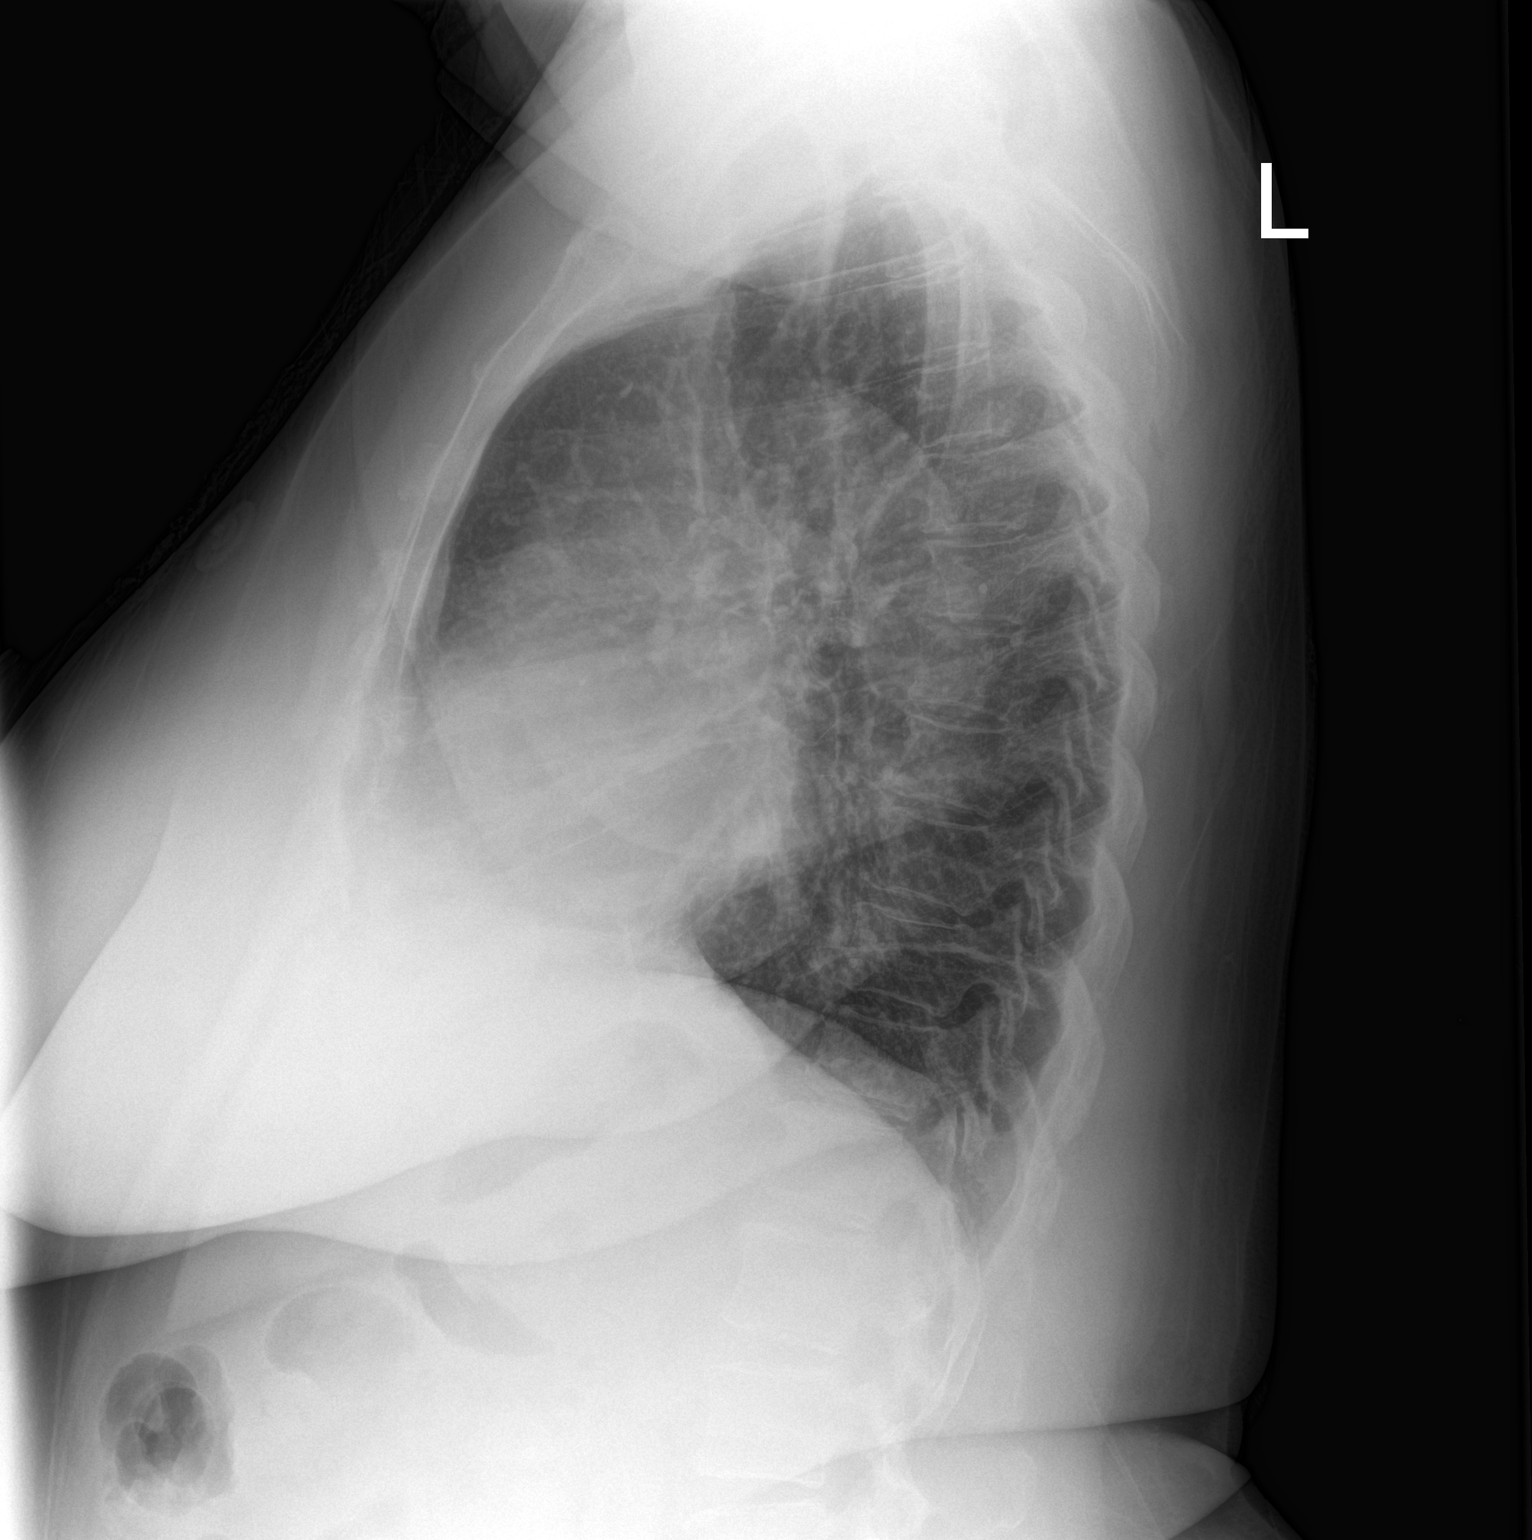

[2 of 2 positions shown; findings below may reference images not displayed]

FINDINGS: The lungs are clear wiithout focal pneumonia, edema, pneumothorax or
pleural effusion. Bibasilar subsegmental atelectasis or linear
scarring is stable Cardiopericardial silhouette is at upper limits
of normal for size. The visualized bony structures of the thorax are
intact.
IMPRESSION: Stable.  No acute findings.

## 2016-04-04 ENCOUNTER — Ambulatory Visit
Admission: RE | Admit: 2016-04-04 | Discharge: 2016-04-04 | Disposition: A | Discharge status: Home / Self Care | Payer: Medicare Other | Source: Ambulatory Visit | Attending: Internal Medicine | Admitting: Internal Medicine

## 2016-04-04 ENCOUNTER — Ambulatory Visit (INDEPENDENT_AMBULATORY_CARE_PROVIDER_SITE_OTHER): Payer: Medicare Other | Admitting: Pharmacist

## 2016-04-04 DIAGNOSIS — Z13820 Encounter for screening for osteoporosis: Secondary | ICD-10-CM | POA: Diagnosis not present

## 2016-04-04 DIAGNOSIS — E2839 Other primary ovarian failure: Secondary | ICD-10-CM

## 2016-04-04 DIAGNOSIS — Z7901 Long term (current) use of anticoagulants: Secondary | ICD-10-CM | POA: Diagnosis not present

## 2016-04-04 DIAGNOSIS — I82409 Acute embolism and thrombosis of unspecified deep veins of unspecified lower extremity: Secondary | ICD-10-CM

## 2016-04-04 DIAGNOSIS — Z1231 Encounter for screening mammogram for malignant neoplasm of breast: Secondary | ICD-10-CM | POA: Diagnosis not present

## 2016-04-04 DIAGNOSIS — Z780 Asymptomatic menopausal state: Secondary | ICD-10-CM | POA: Diagnosis not present

## 2016-04-04 LAB — POCT INR: INR: 2.8

## 2016-04-04 NOTE — Patient Instructions (Signed)
Patient instructed to take medications as defined in the Anti-coagulation Track section of this encounter.  Patient instructed to take today's dose.  Patient instructed to take one (1) of your 4mg  blue colored warfarin tablets, by mouth, once-daily at [facility]. Take each day.  Patient verbalized understanding of these instructions.

## 2016-04-04 NOTE — Progress Notes (Signed)
INTERNAL MEDICINE TEACHING ATTENDING ADDENDUM - Gust Rung, DO Duration- indefinate, Indication- recurrent VTE, INR-  Lab Results  Component Value Date   INR 2.80 04/04/2016  . Agree with pharmacy recommendations as outlined in their note.

## 2016-04-04 NOTE — Progress Notes (Signed)
Anti-Coagulation Progress Note  Caroline [name] is a 71 y.o. female who is currently on an anti-coagulation regimen.    RECENT RESULTS: Recent results are below, the most recent result is correlated with a dose of 28 mg. per week: Lab Results  Component Value Date   INR 2.80 04/04/2016   INR 2.50 03/07/2016   INR 1.70 02/22/2016    ANTI-COAG DOSE: Anticoagulation Dose Instructions as of 04/04/2016      [name] Tue Wed Thu Fri Sat   New Dose 4 mg 4 mg 4 mg 4 mg 4 mg 4 mg 4 mg    Description   Take 1 tablet of your blue-colored 4mg  strength warfarin tablets by mouth once-daily at 6PM each day.       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] PULMONARY EMBOLISM HX OF (Resolved) [Z86.718]       Comments:           ANTICOAG TODAY: Anticoagulation Summary  As of 04/04/2016   INR goal:   2.0-3.0  TTR:     Today's INR:   2.80  Next INR check:   05/02/2016  Target end date:      Indications   DVT lower extremity recurrent (HCC) [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  Patient instructed to take today's dose.  Patient instructed to take one (1) of your 4mg  blue colored warfarin tablets, by mouth, once-daily at [facility]. Take each day.  Patient verbalized understanding of these instructions.       FOLLOW-UP Return in 4 weeks (on  05/02/2016) for Follow up INR at 1015h.  Hulen Luster, III Pharm.D., CACP

## 2016-04-09 ENCOUNTER — Other Ambulatory Visit: Payer: Self-pay | Admitting: Internal Medicine

## 2016-05-02 ENCOUNTER — Ambulatory Visit (INDEPENDENT_AMBULATORY_CARE_PROVIDER_SITE_OTHER): Payer: Medicare Other | Admitting: Pharmacist

## 2016-05-02 ENCOUNTER — Encounter: Payer: Self-pay | Admitting: Internal Medicine

## 2016-05-02 ENCOUNTER — Ambulatory Visit (INDEPENDENT_AMBULATORY_CARE_PROVIDER_SITE_OTHER): Payer: Medicare Other | Admitting: Internal Medicine

## 2016-05-02 ENCOUNTER — Other Ambulatory Visit: Payer: Self-pay | Admitting: Pharmacist

## 2016-05-02 VITALS — BP 139/60 | HR 79 | Temp 97.9°F | Ht 63.0 in | Wt 270.5 lb

## 2016-05-02 DIAGNOSIS — J301 Allergic rhinitis due to pollen: Secondary | ICD-10-CM

## 2016-05-02 DIAGNOSIS — Z86711 Personal history of pulmonary embolism: Secondary | ICD-10-CM

## 2016-05-02 DIAGNOSIS — Z6841 Body Mass Index (BMI) 40.0 and over, adult: Secondary | ICD-10-CM | POA: Diagnosis not present

## 2016-05-02 DIAGNOSIS — I10 Essential (primary) hypertension: Secondary | ICD-10-CM

## 2016-05-02 DIAGNOSIS — I824Y9 Acute embolism and thrombosis of unspecified deep veins of unspecified proximal lower extremity: Secondary | ICD-10-CM

## 2016-05-02 DIAGNOSIS — E785 Hyperlipidemia, unspecified: Secondary | ICD-10-CM

## 2016-05-02 DIAGNOSIS — Z79899 Other long term (current) drug therapy: Secondary | ICD-10-CM

## 2016-05-02 DIAGNOSIS — E782 Mixed hyperlipidemia: Secondary | ICD-10-CM

## 2016-05-02 DIAGNOSIS — Z7901 Long term (current) use of anticoagulants: Secondary | ICD-10-CM

## 2016-05-02 DIAGNOSIS — Z87891 Personal history of nicotine dependence: Secondary | ICD-10-CM | POA: Diagnosis not present

## 2016-05-02 DIAGNOSIS — I82409 Acute embolism and thrombosis of unspecified deep veins of unspecified lower extremity: Secondary | ICD-10-CM

## 2016-05-02 DIAGNOSIS — J309 Allergic rhinitis, unspecified: Secondary | ICD-10-CM

## 2016-05-02 LAB — POCT INR: INR: 2.4

## 2016-05-02 MED ORDER — LOSARTAN POTASSIUM 100 MG PO TABS: 100.0000 mg | ORAL_TABLET | Freq: Every day | ORAL | 1 refills | Status: DC

## 2016-05-02 MED ORDER — METOPROLOL SUCCINATE ER 50 MG PO TB24: 50.0000 mg | ORAL_TABLET | Freq: Every day | ORAL | 1 refills | Status: DC

## 2016-05-02 MED ORDER — WARFARIN SODIUM 4 MG PO TABS: 4.0000 mg | ORAL_TABLET | Freq: Every day | ORAL | 0 refills | Status: DC

## 2016-05-02 MED ORDER — FLUTICASONE PROPIONATE 50 MCG/ACT NA SUSP: 2.0000 | Freq: Every day | NASAL | 0 refills | Status: DC

## 2016-05-02 NOTE — Assessment & Plan Note (Signed)
Recommended for her to continue Zyrtec daily and to start Flonase 2 sprays each nostril daily.

## 2016-05-02 NOTE — Progress Notes (Signed)
Anti-Coagulation Progress Note  Caroline [name] is a 71 y.o. female who is currently on an anti-coagulation regimen.    RECENT RESULTS: Recent results are below, the most recent result is correlated with a dose of 28 mg. per week: Lab Results  Component Value Date   INR 2.40 05/02/2016   INR 2.80 04/04/2016   INR 2.50 03/07/2016    ANTI-COAG DOSE: Anticoagulation Dose Instructions as of 05/02/2016      [name] Tue Wed Thu Fri Sat   New Dose 4 mg 4 mg 4 mg 4 mg 4 mg 4 mg 4 mg    Description   Take 1 tablet of your blue-colored 4mg  strength warfarin tablets by mouth once-daily at 6PM each day.       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] PULMONARY EMBOLISM HX OF (Resolved) [Z86.718]       Comments:           ANTICOAG TODAY: Anticoagulation Summary  As of 05/02/2016   INR goal:   2.0-3.0  TTR:     Today's INR:   2.40  Next INR check:   05/30/2016  Target end date:      Indications   DVT lower extremity recurrent (HCC) [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  Patient instructed to take today's dose.  Patient instructed to take one of your 4mg  blue colored warfarin tablets by mouth, once-daily at [facility] each day.  Patient verbalized understanding of these instructions.       FOLLOW-UP Return in about 4 weeks (around  05/30/2016) for Follow up INR at 1000h.  Hulen Luster, III Pharm.D., CACP

## 2016-05-02 NOTE — Assessment & Plan Note (Signed)
Advised patient to restart Pravastatin 80 mg daily and to be consistent with this medication.

## 2016-05-02 NOTE — Assessment & Plan Note (Signed)
BMI 48. Patient has gained 13 lbs since her last visit. She admits to dietary indiscretion and lack of exercise. We discussed the importance of weight loss and the side effects of obesity. She will work on portion control and improving her dietary intake along with starting to walk 20 mins daily. Will evaluate how her weight is doing at her next visit.

## 2016-05-02 NOTE — Progress Notes (Signed)
   CC: Hypertension follow up  HPI:  [name] is a 71 y.o. woman with PMHx as noted below who presents today for follow up of her hypertension.  HTN: BP elevated at 155/67. Repeat BP improved to 139/60. She is taking Losartan 100 mg daily and Toprol-XL 50 mg daily.   Morbid Obesity: BMI 48. She has gained 13 lbs in the last few months. She denies any shortness of breath, orthopnea, chest pain, or leg swelling. She has been taking her Lasix every day. She admits she has been eating a lot more lately and needs to work on portion control. She notes she could work on walking more frequently as well.   Allergic Rhinitis: Reports she is taking Zyrtec daily but is still having rhinorrhea and sneezing. She denies any purulent mucus discharge or fevers. She has not tried a nasal spray.   Hyperlipidemia: We reviewed her labs from last visit. Her cholesterol and LDL have gone up from 2016. She admits she has not been taking her Pravastatin consistently.   Past Medical History:  Diagnosis Date  . Arthritis   . Asthma   . Bronchitis   . CHF (congestive heart failure) (HCC)   . DVT, lower extremity, recurrent (HCC)    Patient had unprovoked PE on 2002 and DVT in right lower extremety 2008.  [name] GERD (gastroesophageal reflux disease)   . History of kidney stones   . Hypertension   . PE (pulmonary embolism)    Patient had unprovoked PE on 2002  . PONV (postoperative nausea and vomiting)   . Vertigo     Review of Systems:   All negative except per HPI  Physical Exam:  Vitals:   05/02/16 1013  BP: (!) 155/67  Pulse: 85  Temp: 97.9 F (36.6 C)  TempSrc: Oral  SpO2: 99%  Weight: 270 lb 8 oz (122.7 kg)  Height: 5\' 3"  (1.6 m)   Repeat BP 139/60  General: Obese woman in NAD HEENT: EOMI, sclera anicteric, no tenderness to maxillary sinuses, nares appear normal, no posterior pharynx erythema, mucus membranes moist CV: RRR, no m/g/r   Assessment & Plan:   See Encounters Tab for  problem based charting.  Patient discussed with Dr. Rogelia Boga

## 2016-05-02 NOTE — Patient Instructions (Addendum)
General Instructions: - Continue Zyrtec. Try Flonase nasal spray, 2 sprays each nostril once daily. - Continue Losartan 100 mg daily and Lopressor 25 mg twice daily for blood pressure - Work on portion control to help with weight loss. Try walking for at least 20 minutes every day! - Remember to take cholesterol pill!  Please bring your medicines with you each time you come to clinic.  Medicines may include prescription medications, over-the-counter medications, herbal remedies, eye drops, vitamins, or other pills.   Progress Toward Treatment Goals:  Treatment Goal 11/03/2014  Blood pressure unable to assess    Self Care Goals & Plans:  Self Care Goal 05/05/2015  Manage my medications take my medicines as prescribed; bring my medications to every visit; refill my medications on time  Eat healthy foods eat more vegetables; eat foods that are low in salt; eat baked foods instead of fried foods  Be physically active find an activity I enjoy  Meeting treatment goals -    No flowsheet data found.   Care Management & Community Referrals:  Referral 10/30/2012  Referrals made for care management support none needed

## 2016-05-02 NOTE — Patient Instructions (Signed)
Patient instructed to take medications as defined in the Anti-coagulation Track section of this encounter.  Patient instructed to take today's dose.  Patient instructed to take one of your 4mg  blue colored warfarin tablets by mouth, once-daily at [facility] each day.  Patient verbalized understanding of these instructions.

## 2016-05-02 NOTE — Progress Notes (Signed)
Internal Medicine Clinic Attending  Case discussed with Dr. Rivet soon after the resident saw the patient.  We reviewed the resident's history and exam and pertinent patient test results.  I agree with the assessment, diagnosis, and plan of care documented in the resident's note.  

## 2016-05-02 NOTE — Assessment & Plan Note (Signed)
BP borderline. Discussed that her weight gain has likely contributed to her increase in BP. Will have her continue Losartan 100 mg daily and Toprol-XL 50 mg daily. We discussed ways to improve her diet and she will start walking 20 minutes daily in efforts to lose weight.

## 2016-05-09 ENCOUNTER — Other Ambulatory Visit: Payer: Self-pay | Admitting: Internal Medicine

## 2016-05-09 DIAGNOSIS — I824Y9 Acute embolism and thrombosis of unspecified deep veins of unspecified proximal lower extremity: Secondary | ICD-10-CM

## 2016-05-10 ENCOUNTER — Other Ambulatory Visit: Payer: Self-pay | Admitting: Internal Medicine

## 2016-05-10 DIAGNOSIS — I82409 Acute embolism and thrombosis of unspecified deep veins of unspecified lower extremity: Secondary | ICD-10-CM

## 2016-05-21 ENCOUNTER — Other Ambulatory Visit: Payer: Self-pay | Admitting: Internal Medicine

## 2016-05-27 ENCOUNTER — Other Ambulatory Visit: Payer: Self-pay | Admitting: Internal Medicine

## 2016-05-27 DIAGNOSIS — I824Y9 Acute embolism and thrombosis of unspecified deep veins of unspecified proximal lower extremity: Secondary | ICD-10-CM

## 2016-05-30 ENCOUNTER — Ambulatory Visit (INDEPENDENT_AMBULATORY_CARE_PROVIDER_SITE_OTHER): Payer: Medicare Other | Admitting: Pharmacist

## 2016-05-30 DIAGNOSIS — I82409 Acute embolism and thrombosis of unspecified deep veins of unspecified lower extremity: Secondary | ICD-10-CM

## 2016-05-30 DIAGNOSIS — I824Y9 Acute embolism and thrombosis of unspecified deep veins of unspecified proximal lower extremity: Secondary | ICD-10-CM

## 2016-05-30 DIAGNOSIS — Z7901 Long term (current) use of anticoagulants: Secondary | ICD-10-CM | POA: Diagnosis not present

## 2016-05-30 DIAGNOSIS — Z86711 Personal history of pulmonary embolism: Secondary | ICD-10-CM

## 2016-05-30 LAB — POCT INR: INR: 2.7

## 2016-05-30 MED ORDER — WARFARIN SODIUM 4 MG PO TABS: ORAL_TABLET | ORAL | 2 refills | Status: DC

## 2016-05-30 NOTE — Patient Instructions (Signed)
Patient instructed to take medications as defined in the Anti-coagulation Track section of this encounter.  Patient instructed to take today's dose.  Patient instructed to take 1 tablet of your 4mg  blue warfarin tablets by mouth, once-daily at 6PM--EXCEPT on [facility], take ONLY 1/2 tablet on [facility].  Patient verbalized understanding of these instructions.

## 2016-05-30 NOTE — Progress Notes (Signed)
I reviewed Dr. Saralyn Pilar note.  Patient is on coumadin for recurrent VTE.  INR is 2.7.

## 2016-05-30 NOTE — Progress Notes (Signed)
Anti-Coagulation Progress Note  Caroline [name] is a 71 y.o. female who is currently on an anti-coagulation regimen for DVT, lower extremity, recurrent (HCC) [182.409, Pulmonary embolism, history of (Resolved) [Z86.718] and long term use of anticoagulation. Given her history of multiple recurrence of VTE--long term use is warranted--but as always, an annual re-assessment of continued benefit vs. Risk should be conducted with patient and with Caroline advice and consent of Caroline patient--drug therapy continued--or discontinued based upon your assessment and wishes of Caroline patient.     RECENT RESULTS: Recent results are below, Caroline most recent result is correlated with a dose of 28 mg. per week: Lab Results  Component Value Date   INR 2.70 05/30/2016   INR 2.40 05/02/2016   INR 2.80 04/04/2016    ANTI-COAG DOSE: Anticoagulation Warfarin Dose Instructions as of 05/30/2016      Caroline [name] Tue Wed Thu Fri Sat   New Dose 4 mg 4 mg 4 mg 2 mg 4 mg 4 mg 4 mg    Description   Take 1 tablet of your blue-colored 4mg  strength warfarin tablets by mouth once-daily at 6PM each day.       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] PULMONARY EMBOLISM HX OF (Resolved) [Z86.718]       Comments:           ANTICOAG TODAY: Anticoagulation Summary  As of 05/30/2016   INR goal:   2.0-3.0  TTR:     Today's INR:   2.70  Next INR check:   06/27/2016  Target end date:      Indications   DVT lower extremity recurrent (HCC) [I82.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  Patient instructed to take today's dose.  Patient instructed to take 1 tablet of your 4mg  blue warfarin tablets by mouth, once-daily at 6PM--EXCEPT on Caroline [name], take ONLY 1/2 tablet on Caroline [name].  Patient verbalized understanding of these instructions.       FOLLOW-UP Return in 4 weeks (on 06/27/2016) for Follow up INR at 0945h.  Caroline [name], III Pharm.D., CACP

## 2016-06-20 ENCOUNTER — Other Ambulatory Visit: Payer: Self-pay | Admitting: Internal Medicine

## 2016-06-21 ENCOUNTER — Encounter: Payer: Self-pay | Admitting: *Deleted

## 2016-06-21 ENCOUNTER — Ambulatory Visit (INDEPENDENT_AMBULATORY_CARE_PROVIDER_SITE_OTHER): Payer: Medicare Other | Admitting: Internal Medicine

## 2016-06-21 ENCOUNTER — Encounter: Payer: Self-pay | Admitting: Internal Medicine

## 2016-06-21 VITALS — BP 133/64 | HR 68 | Temp 98.2°F | Ht 63.0 in | Wt 271.4 lb

## 2016-06-21 DIAGNOSIS — I110 Hypertensive heart disease with heart failure: Secondary | ICD-10-CM | POA: Diagnosis not present

## 2016-06-21 DIAGNOSIS — Z6841 Body Mass Index (BMI) 40.0 and over, adult: Secondary | ICD-10-CM

## 2016-06-21 DIAGNOSIS — I5042 Chronic combined systolic (congestive) and diastolic (congestive) heart failure: Secondary | ICD-10-CM | POA: Diagnosis not present

## 2016-06-21 DIAGNOSIS — I10 Essential (primary) hypertension: Secondary | ICD-10-CM

## 2016-06-21 DIAGNOSIS — Z79899 Other long term (current) drug therapy: Secondary | ICD-10-CM

## 2016-06-21 DIAGNOSIS — I82409 Acute embolism and thrombosis of unspecified deep veins of unspecified lower extremity: Secondary | ICD-10-CM

## 2016-06-21 DIAGNOSIS — Z86718 Personal history of other venous thrombosis and embolism: Secondary | ICD-10-CM | POA: Diagnosis not present

## 2016-06-21 DIAGNOSIS — Z86711 Personal history of pulmonary embolism: Secondary | ICD-10-CM | POA: Diagnosis not present

## 2016-06-21 DIAGNOSIS — Z7901 Long term (current) use of anticoagulants: Secondary | ICD-10-CM

## 2016-06-21 DIAGNOSIS — Z87891 Personal history of nicotine dependence: Secondary | ICD-10-CM | POA: Diagnosis not present

## 2016-06-21 DIAGNOSIS — E785 Hyperlipidemia, unspecified: Secondary | ICD-10-CM

## 2016-06-21 DIAGNOSIS — E782 Mixed hyperlipidemia: Secondary | ICD-10-CM | POA: Diagnosis not present

## 2016-06-21 MED ORDER — HYDROCORTISONE 2.5 % RE CREA: TOPICAL_CREAM | RECTAL | 5 refills | Status: DC

## 2016-06-21 MED ORDER — CALCIUM CITRATE-VITAMIN D 315-200 MG-UNIT PO TABS: 2.0000 | ORAL_TABLET | Freq: Every day | ORAL | 3 refills | Status: DC

## 2016-06-21 MED ORDER — FUROSEMIDE 40 MG PO TABS: 40.0000 mg | ORAL_TABLET | ORAL | 1 refills | Status: DC

## 2016-06-21 NOTE — Patient Instructions (Signed)
General Instructions: - Start walking 20-30 minutes outside every day - Work on portion control and cutting down on carbohydrate foods such as crackers, bread - Will discuss with Dr. Elberta Fortis if he thinks a stress test is necessary - Will check labs today and let you know if you need to restart potassium   Thank you for being such a wonderful patient. I will miss you dearly! Dr. Beckie Salts  Please bring your medicines with you each time you come to clinic.  Medicines may include prescription medications, over-the-counter medications, herbal remedies, eye drops, vitamins, or other pills.   Progress Toward Treatment Goals:  Treatment Goal 11/03/2014  Blood pressure unable to assess    Self Care Goals & Plans:  Self Care Goal 06/21/2016  Manage my medications bring my medications to every visit; take my medicines as prescribed; refill my medications on time  Monitor my health keep track of my blood pressure; check my feet daily  Eat healthy foods eat baked foods instead of fried foods; eat more vegetables  Be physically active find an activity I enjoy  Meeting treatment goals -    No flowsheet data found.   Care Management & Community Referrals:  Referral 10/30/2012  Referrals made for care management support none needed

## 2016-06-21 NOTE — Assessment & Plan Note (Signed)
Pravastatin restarted since last visit. Will recheck a lipid panel since it has been 8 weeks.

## 2016-06-21 NOTE — Assessment & Plan Note (Addendum)
Despite her weight increasing, she does not appear volume overloaded on exam. I suspect her weight gain is more related to dietary indiscretion and not fluid gain. Given her mildly reduced EF, she may benefit from a stress test to see if she has evidence of ischemic disease. However, the echo was obtained during a hospitalization for sepsis. She is not having any symptoms suggestive of angina and her volume status appears stable. Will discuss with her cardiologist, Dr. Elberta Fortis to see what the best course of action will be. Continue Lasix 40 mg every other day, Toprol-XL, and Losartan. Will check a bmet today to evaluate her potassium as she has not been on supplementation for a few months.

## 2016-06-21 NOTE — Assessment & Plan Note (Signed)
Her weight increased 1 lb, BMI 48. We discussed the importance of weight loss and how obesity can affect her health. She seems motivated to make lifestyle changes but did not make any since her last visit. I encouraged her to start a walking regimen and to cut down on carbohydrates. She is adamant that she will start working towards these goals. Can consider discussing a specific weight loss goal at her next visit to see if this provides any more motivation.

## 2016-06-21 NOTE — Assessment & Plan Note (Signed)
On warfarin indefinitely for hx DVT and unprovoked PE. Last INR therapeutic at 2.7. She follows with Dr. Alexandria Lodge in the coumadin clinic.

## 2016-06-21 NOTE — Assessment & Plan Note (Signed)
Repeat BP improved to 133/64. Would prefer for her BP to be lower but caution with her vertigo and age. Will continue Losartan 100 mg daily and Toprol-XL 50 mg daily for now. Would have low threshold to start another antihypertensive if BP remains elevated.

## 2016-06-21 NOTE — Progress Notes (Signed)
   CC: Hypertension follow up  HPI:  [name] is a 71 y.o. woman with PMHx as noted below who presents today for follow up of her hypertension.   HTN: BP today is 154/69. Repeat BP 133/64. She is taking Losartan 100 mg daily and Toprol-XL 50 mg daily.   Morbid Obesity: Her weight had increased 13 lbs at her last visit (BMI 48). We had discussed working on portion control and starting a walking regimen. Today her weight is up 1 lb from her last visit. She admits she has not cut down on her portion sizes. She reports eating a high carbohydrate diet mainly of cheese and crackers and bread. She wants to work on her diet and start walking because she knows the weight negatively impacts her health.  Hx DVT and unprovoked PE: Doing well on warfarin. Her last INR was 2.7. Denies any bleeding.   Combined CHF: Last echo in Sept 2017 showed a mildly reduced EF of 45-50% with grade 2 diastolic dysfunction, no wall motion abnormalities. She denies any chest pain or shortness of breath. She does not have to stop when she is shopping at the grocery store. She is taking Lasix 40 mg every other day, Toprol-XL, and Losartan.   Hyperlipidemia: She had not been taking her Pravastatin at her last visit. She was advised to restart this and reports she has been taking it. Denies any abdominal pain.   Past Medical History:  Diagnosis Date  . Arthritis   . Asthma   . Bronchitis   . CHF (congestive heart failure) (HCC)   . DVT, lower extremity, recurrent (HCC)    Patient had unprovoked PE on 2002 and DVT in right lower extremety 2008.  [name] GERD (gastroesophageal reflux disease)   . History of kidney stones   . Hypertension   . PE (pulmonary embolism)    Patient had unprovoked PE on 2002  . PONV (postoperative nausea and vomiting)   . Vertigo     Review of Systems:   All negative except per HPI  Physical Exam:  Vitals:   06/21/16 1322  BP: (!) 154/69  Pulse: 78  Temp: 98.2 F (36.8 C)    TempSrc: Oral  SpO2: 99%  Weight: 271 lb 6.4 oz (123.1 kg)  Height: 5\' 3"  (1.6 m)   Repeat BP 133/64  General: Morbidly obese woman sitting up, NAD HEENT: EOMI, sclera anicteric, mucus membranes moist CV: RRR, no m/g/r Pulm: CTA bilaterally, breaths non-labored Ext: no peripheral edema  Assessment & Plan:   See Encounters Tab for problem based charting.  Patient discussed with Dr. Criselda Peaches

## 2016-06-22 ENCOUNTER — Other Ambulatory Visit: Payer: Self-pay | Admitting: Internal Medicine

## 2016-06-22 DIAGNOSIS — I5042 Chronic combined systolic (congestive) and diastolic (congestive) heart failure: Secondary | ICD-10-CM

## 2016-06-22 LAB — BMP8+ANION GAP
Anion Gap: 14 mmol/L (ref 10.0–18.0)
BUN / CREAT RATIO: 21 (ref 12–28)
BUN: 19 mg/dL (ref 8–27)
CHLORIDE: 99 mmol/L (ref 96–106)
CO2: 30 mmol/L — ABNORMAL HIGH (ref 20–29)
Calcium: 10 mg/dL (ref 8.7–10.3)
Creatinine, Ser: 0.9 mg/dL (ref 0.57–1.00)
GFR calc Af Amer: 75 mL/min/{1.73_m2} (ref >59)
GFR calc non Af Amer: 65 mL/min/{1.73_m2} (ref >59)
GLUCOSE: 89 mg/dL (ref 65–99)
POTASSIUM: 4 mmol/L (ref 3.5–5.2)
SODIUM: 143 mmol/L (ref 134–144)

## 2016-06-22 LAB — LIPID PANEL
Chol/HDL Ratio: 4.7 ratio — ABNORMAL HIGH (ref 0.0–4.4)
Cholesterol, Total: 205 mg/dL — ABNORMAL HIGH (ref 100–199)
HDL: 44 mg/dL (ref >39)
LDL Calculated: 128 mg/dL — ABNORMAL HIGH (ref 0–99)
Triglycerides: 165 mg/dL — ABNORMAL HIGH (ref 0–149)
VLDL Cholesterol Cal: 33 mg/dL (ref 5–40)

## 2016-06-23 ENCOUNTER — Telehealth: Payer: Self-pay | Admitting: *Deleted

## 2016-06-23 NOTE — Telephone Encounter (Signed)
Dr Beckie Salts - order for stress test entered incorrectly. Use imaging number " [id number]". Thanks

## 2016-06-23 NOTE — Telephone Encounter (Signed)
-----   Message from Su Hoff, MD sent at 06/22/2016  7:43 PM EDT ----- Ms. Glenda,  Could you please let Ms. Yasin know that I discussed her heart failure with Dr. Elberta Fortis and that we agree she should undergo a stress test. I have placed this order in [facility]. Please let me know if I put in the wrong location (I put in [facility]) for this order. She will need to be scheduled for this. Also please let her know her potassium level was fine on her labs and she does not need to take potassium.  Thank you! Carly

## 2016-06-23 NOTE — Telephone Encounter (Signed)
Pt was called / informed Dr Beckie Salts has discussed her heart failure w/ Dr Elberta Fortis; she will order a stress test (she will be called w/appt).  Also K+ level is normal and to stop taking potassium per Dr Beckie Salts. Pt stated she has not taken any potassium in 6 months.

## 2016-06-24 ENCOUNTER — Other Ambulatory Visit: Payer: Self-pay | Admitting: Internal Medicine

## 2016-06-24 DIAGNOSIS — I5042 Chronic combined systolic (congestive) and diastolic (congestive) heart failure: Secondary | ICD-10-CM

## 2016-06-27 ENCOUNTER — Ambulatory Visit (INDEPENDENT_AMBULATORY_CARE_PROVIDER_SITE_OTHER): Payer: Medicare Other | Admitting: Pharmacist

## 2016-06-27 DIAGNOSIS — I82409 Acute embolism and thrombosis of unspecified deep veins of unspecified lower extremity: Secondary | ICD-10-CM

## 2016-06-27 DIAGNOSIS — Z7901 Long term (current) use of anticoagulants: Secondary | ICD-10-CM | POA: Diagnosis not present

## 2016-06-27 DIAGNOSIS — Z86711 Personal history of pulmonary embolism: Secondary | ICD-10-CM

## 2016-06-27 LAB — POCT INR: INR: 2.5

## 2016-06-27 NOTE — Progress Notes (Signed)
Internal Medicine Clinic Attending  Case discussed with Dr. Rivet at the time of the visit.  We reviewed the resident's history and exam and pertinent patient test results.  I agree with the assessment, diagnosis, and plan of care documented in the resident's note.  

## 2016-06-27 NOTE — Progress Notes (Signed)
Anticoagulation Management Caroline [name] is a 71 y.o. female who reports to the clinic for monitoriCng of warfarin treatment.    Indication: DVT  Duration: indefinite Supervising physician: Debe Coder  Anticoagulation Clinic Visit History: Patient does not report signs/symptoms of bleeding or thromboembolism  Other recent changes: No diet, medications, lifestyle modifications or changes reported.  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    Anticoagulation Warfarin Dose Instructions as of 06/27/2016      Total Sun Mon Tue Wed Thu Fri Sat   New Dose 26 mg 4 mg 4 mg 4 mg 2 mg 4 mg 4 mg 4 mg     (4 mg x 1)  (4 mg x 1)  (4 mg x 1)  (4 mg x 0.5)  (4 mg x 1)  (4 mg x 1)  (4 mg x 1)                         Description   Take 1 tablet of your blue-colored 4mg  strength warfarin tablets by mouth once-daily at 6PM all days of week--EXCEPT on Wednesdays. On Wednesdays, take 1/2 tablet of your 4mg  blue warfarin tablet(s).      INR today: Therapeutic  PLAN Weekly dose  was unchanged   Patient Instructions  Patient instructed to take medications as defined in the Anti-coagulation Track section of this encounter.  Patient instructed to take today's dose.  Patient instructed to take 1 tablet of your blue-colored 4mg  strength warfarin tablets by mouth once-daily at 6PM all days of week--EXCEPT on Wednesdays. On Wednesdays, take 1/2 tablet of your 4mg  blue warfarin tablet(s).  Patient verbalized understanding of these instructions.     Patient advised to contact clinic or seek medical attention if signs/symptoms of bleeding or thromboembolism occur.  Patient verbalized understanding by repeating back information and was advised to contact me if further medication-related questions arise. Patient was also provided an information handout.  Follow-up Return in about 4 weeks (around 07/25/2016) for Follow up INR at 0945h.  Gar Ponto. PharmD, CACP  15 minutes spent face-to-face with the patient during the encounter. 50% of time spent on education. 50% of time was spent on point of care finger-stick sample collection, processing and interpretation  and data-entry.

## 2016-06-27 NOTE — Patient Instructions (Signed)
Patient instructed to take medications as defined in the Anti-coagulation Track section of this encounter.  Patient instructed to take today's dose.  Patient instructed to take 1 tablet of your blue-colored 4mg  strength warfarin tablets by mouth once-daily at 6PM all days of week--EXCEPT on Wednesdays. On Wednesdays, take 1/2 tablet of your 4mg  blue warfarin tablet(s).  Patient verbalized understanding of these instructions.

## 2016-06-28 NOTE — Progress Notes (Signed)
I have reviewed Dr. Saralyn Pilar note.  Patient is on [facility] for DVT.  INR at goal.

## 2016-07-07 ENCOUNTER — Other Ambulatory Visit: Payer: Self-pay | Admitting: Internal Medicine

## 2016-07-07 DIAGNOSIS — I5042 Chronic combined systolic (congestive) and diastolic (congestive) heart failure: Secondary | ICD-10-CM

## 2016-07-08 ENCOUNTER — Telehealth: Payer: Self-pay | Admitting: Internal Medicine

## 2016-07-08 NOTE — Telephone Encounter (Signed)
Called patient to schedule nuclear stress test, patient advised she has Vertigo and can not lay flat to have the pictures of the heart taken.

## 2016-07-08 NOTE — Telephone Encounter (Signed)
Bonita Quin (nuclear med) called stating when she called patient  to get stress test scheduled, patient said she is unable to do it due to dizziness. Patient unable to lie down for few hours for the procedure. Appt has not been set up. Bonita Quin wants to know if you would like to order another procedure. Thanks!

## 2016-07-14 NOTE — Telephone Encounter (Signed)
I think she would need to speak to her cardiologist - Dr. Elberta Fortis to see if there is another test he would want to do

## 2016-07-14 NOTE — Telephone Encounter (Signed)
Tried to call pt, no answer, rings and rings

## 2016-07-25 ENCOUNTER — Ambulatory Visit (INDEPENDENT_AMBULATORY_CARE_PROVIDER_SITE_OTHER): Payer: Medicare Other | Admitting: Pharmacist

## 2016-07-25 ENCOUNTER — Encounter (INDEPENDENT_AMBULATORY_CARE_PROVIDER_SITE_OTHER): Payer: Self-pay

## 2016-07-25 ENCOUNTER — Ambulatory Visit (INDEPENDENT_AMBULATORY_CARE_PROVIDER_SITE_OTHER): Payer: Medicare Other | Admitting: Cardiology

## 2016-07-25 ENCOUNTER — Encounter: Payer: Self-pay | Admitting: Cardiology

## 2016-07-25 VITALS — BP 136/90 | HR 80 | Ht 63.0 in | Wt 274.6 lb

## 2016-07-25 DIAGNOSIS — I82509 Chronic embolism and thrombosis of unspecified deep veins of unspecified lower extremity: Secondary | ICD-10-CM | POA: Diagnosis not present

## 2016-07-25 DIAGNOSIS — I10 Essential (primary) hypertension: Secondary | ICD-10-CM

## 2016-07-25 DIAGNOSIS — I428 Other cardiomyopathies: Secondary | ICD-10-CM | POA: Diagnosis not present

## 2016-07-25 DIAGNOSIS — I5042 Chronic combined systolic (congestive) and diastolic (congestive) heart failure: Secondary | ICD-10-CM

## 2016-07-25 DIAGNOSIS — Z86711 Personal history of pulmonary embolism: Secondary | ICD-10-CM

## 2016-07-25 DIAGNOSIS — Z7901 Long term (current) use of anticoagulants: Secondary | ICD-10-CM | POA: Diagnosis not present

## 2016-07-25 DIAGNOSIS — R002 Palpitations: Secondary | ICD-10-CM | POA: Diagnosis not present

## 2016-07-25 DIAGNOSIS — I82409 Acute embolism and thrombosis of unspecified deep veins of unspecified lower extremity: Secondary | ICD-10-CM

## 2016-07-25 LAB — POCT INR: INR: 2.3

## 2016-07-25 MED ORDER — CARVEDILOL 12.5 MG PO TABS: 12.5000 mg | ORAL_TABLET | Freq: Two times a day (BID) | ORAL | 3 refills | Status: DC

## 2016-07-25 NOTE — Patient Instructions (Addendum)
Medication Instructions:   Your physician has recommended you make the following change in your medication:  1) STOP Metoprolol Succinate (Toprol) 2) START Carvedilol 12.5 mg twice a day  - If you need a refill on your cardiac medications before your next appointment, please call your pharmacy.   Labwork:  None ordered  Testing/Procedures: Your physician has recommended that you wear an event monitor. Event monitors are medical devices that record the heart's electrical activity. Doctors most often [place] these monitors to diagnose arrhythmias. Arrhythmias are problems with the speed or rhythm of the heartbeat. The monitor is a small, portable device. You can wear one while you do your normal daily activities. This is usually used to diagnose what is causing palpitations/syncope (passing out).  Follow-Up:  Your physician wants you to follow-up in: 6 months with Dr. Elberta Fortis. You will receive a reminder letter in the mail two months in advance. If you don't receive a letter, please call our office to schedule the follow-up appointment.  Thank you for choosing [facility]!!   Dory Horn, RN [phone]  Any Other Special Instructions Will Be Listed Below (If Applicable).   Cardiac Event Monitoring A cardiac event monitor is a small recording device that is used to detect abnormal heart rhythms (arrhythmias). The monitor is used to record your heart rhythm when you have symptoms, such as:  Fast heartbeats (palpitations), such as heart racing or fluttering.  Dizziness.  Fainting or light-headedness.  Unexplained weakness.  Some monitors are wired to electrodes placed on your chest. Electrodes are flat, sticky disks that attach to your skin. Other monitors may be hand-held or worn on the wrist. The monitor can be worn for up to 30 days. If the monitor is attached to your chest, a technician will prepare your chest for the electrode placement and show you how to work the monitor.  Take time to practice using the monitor before you leave the office. Make sure you understand how to send the information from the monitor to your health care provider. In some cases, you may need to use a landline telephone instead of a cell phone. What are the risks? Generally, this device is safe to use, but it possible that the skin under the electrodes will become irritated. How to use your cardiac event monitor  Wear your monitor at all times, except when you are in water: ? Do not let the monitor get wet. ? Take the monitor off when you bathe. Do not swim or use a hot tub with it on.  Keep your skin clean. Do not put body lotion or moisturizer on your chest.  Change the electrodes as told by your health care provider or any time they stop sticking to your skin. You may need to use medical tape to keep them on.  Try to put the electrodes in slightly different places on your chest to help prevent skin irritation. They must remain in the area under your left breast and in the upper right section of your chest.  Make sure the monitor is safely clipped to your clothing or in a location close to your body that your health care provider recommends.  Press the button to record as soon as you feel heart-related symptoms, such as: ? Dizziness. ? Weakness. ? Light-headedness. ? Palpitations. ? Thumping or pounding in your chest. ? Shortness of breath. ? Unexplained weakness.  Keep a diary of your activities, such as walking, doing chores, and taking medicine. It is very important  to note what you were doing when you pushed the button to record your symptoms. This will help your health care provider determine what might be contributing to your symptoms.  Send the recorded information as recommended by your health care provider. It may take some time for your health care provider to process the results.  Change the batteries as told by your health care provider.  Keep electronic devices  away from your monitor. This includes: ? Tablets. ? MP3 players. ? Cell phones.  While wearing your monitor you should avoid: ? Electric blankets. ? Firefighter. ? Electric toothbrushes. ? Microwave ovens. ? Magnets. ? Metal detectors. Get help right away if:  You have chest pain.  You have extreme difficulty breathing or shortness of breath.  You develop a very fast heartbeat that persists.  You develop dizziness that does not go away.  You faint or constantly feel like you are about to faint. Summary  A cardiac event monitor is a small recording device that is used to help detect abnormal heart rhythms (arrhythmias).  The monitor is used to record your heart rhythm when you have heart-related symptoms.  Make sure you understand how to send the information from the monitor to your health care provider.  It is important to press the button on the monitor when you have any heart-related symptoms.  Keep a diary of your activities, such as walking, doing chores, and taking medicine. It is very important to note what you were doing when you pushed the button to record your symptoms. This will help your health care provider learn what might be causing your symptoms. This information is not intended to replace advice given to you by your health care provider. Make sure you discuss any questions you have with your health care provider. Document Released: 10/06/2007 Document Revised: 12/12/2015 Document Reviewed: 12/12/2015 Elsevier Interactive Patient Education  2017 [employer].

## 2016-07-25 NOTE — Progress Notes (Signed)
Anticoagulation Management Caroline [name] is a 71 y.o. female who reports to the clinic for monitoring of warfarin treatment.    Indication: DVT with recurrence, requiring long term anticoagulation indefinitely.  Duration: indefinite Supervising physician: Blanch Media  Anticoagulation Clinic Visit History: Patient does not report signs/symptoms of bleeding or thromboembolism  Other recent changes: No diet, medications, lifestyle changes endorsed.  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] PULMONARY EMBOLISM HX OF (Resolved) [Z86.718]       Comments:          ASSESSMENT Recent Results: The most recent result is correlated with 26 mg per week: Lab Results  Component Value Date   INR 2.30 07/25/2016   INR 2.50 06/27/2016   INR 2.70 05/30/2016    Anticoagulation Dosing: INR as of 07/25/2016 and Previous Warfarin Dosing Information    INR Dt INR Goal [facility] Sun Mon Tue Wed Thu Fri Sat   07/25/2016 2.30 2.0-3.0 26 mg 4 mg 4 mg 4 mg 2 mg 4 mg 4 mg 4 mg    Previous description   Take 1 tablet of your blue-colored 4mg  strength warfarin tablets by mouth once-daily at 6PM all days of week--EXCEPT on Wednesdays. On Wednesdays, take 1/2 tablet of your 4mg  blue warfarin tablet(s).    Anticoagulation Warfarin Dose Instructions as of 07/25/2016      Total Sun Mon Tue Wed Thu Fri Sat   New Dose 26 mg 4 mg 4 mg 4 mg 2 mg 4 mg 4 mg 4 mg     (4 mg x 1)  (4 mg x 1)  (4 mg x 1)  (4 mg x 0.5)  (4 mg x 1)  (4 mg x 1)  (4 mg x 1)                         Description   Take 1 tablet of your blue-colored 4mg  strength warfarin tablets by mouth once-daily at 6PM all days  of week--EXCEPT on Wednesdays. On Wednesdays, take 1/2 tablet of your 4mg  blue warfarin tablet(s).      INR today: Therapeutic  PLAN Weekly dose was unchanged .  Patient Instructions  Patient instructed to take medications as defined in the Anti-coagulation Track section of this encounter.  Patient instructed to take today's dose.  Patient instructed to take ONE (1) tablet of your 4mg  blue warfarin tablets by mouth once-daily at 6PM---EXCEPT on Wednesdays, take ONLY 1/2 tablet on Wednesdays.  Patient verbalized understanding of these instructions.     Patient advised to contact clinic or seek medical attention if signs/symptoms of bleeding or thromboembolism occur.  Patient verbalized understanding by repeating back information and was advised to contact me if further medication-related questions arise. Patient was also provided an information handout.  Follow-up Return in 4 weeks (on 08/22/2016) for Follow up INR at 0930h.  Gar Ponto PharmD, CACP, CPP  15 minutes spent face-to-face with the patient during the encounter. 50% of time spent on education. 50% of time was  spent on finger stick point of care INR sample collection, processing, interpretation of results, and data entry in to [facility] and [email or web address].

## 2016-07-25 NOTE — Patient Instructions (Signed)
Patient instructed to take medications as defined in the Anti-coagulation Track section of this encounter.  Patient instructed to take today's dose.  Patient instructed to take ONE (1) tablet of your 4mg  blue warfarin tablets by mouth once-daily at 6PM---EXCEPT on Wednesdays, take ONLY 1/2 tablet on Wednesdays.  Patient verbalized understanding of these instructions.

## 2016-07-25 NOTE — Progress Notes (Signed)
Electrophysiology Office Note   Date:  07/25/2016   ID:  ADIN ELSBERRY, DOB [birth year], MRN [id number]  PCP:  Camelia Phenes, DO  Cardiologist:  Herlinda Heady Jorja Loa, MD    Chief Complaint  Patient presents with  . Follow-up    Chronic combined systolic and diastolic HF     History of Present Illness: Caroline [name] is a 71 y.o. female who presents today for electrophysiology evaluation.   S/p hospital admission from 09/16/15-09/23/15 with klebsiella pneumonia sepsis and right ureteral calculi. She was started on iv fluids and iv antibiotics. She underwent cystoscopy and right retrograde ureterogram and had right ureteral stent placed. She was followed by urology. She has PMH of pulmonary embolism, HTN, combined CHF.    Today, denies symptoms of palpitations, chest pain, shortness of breath, orthopnea, PND, lower extremity edema, claudication, dizziness, presyncope, syncope, bleeding, or neurologic sequela. The patient is tolerating medications without difficulties and is otherwise without complaint today.  Plans of fluttering in her chest. She says that this happens when she gets anxious, eats a big meal, or is walking. There are no alleviating factors. It occurs once every 2 weeks. She otherwise has no major complaints.  Past Medical History:  Diagnosis Date  . Arthritis   . Asthma   . Bronchitis   . CHF (congestive heart failure) (HCC)   . DVT, lower extremity, recurrent (HCC)    Patient had unprovoked PE on 2002 and DVT in right lower extremety 2008.  [name] GERD (gastroesophageal reflux disease)   . History of kidney stones   . Hypertension   . PE (pulmonary embolism)    Patient had unprovoked PE on 2002  . PONV (postoperative nausea and vomiting)   . Vertigo    Past Surgical History:  Procedure Laterality Date  . CYSTOSCOPY W/ URETERAL STENT PLACEMENT Right 09/16/2015   Procedure: CYSTOSCOPY WITH RETROGRADE PYELOGRAM/URETERAL STENT PLACEMENT;  Surgeon: Alfredo Martinez,  MD;  Location: [facility];  Service: Urology;  Laterality: Right;  . CYSTOSCOPY WITH RETROGRADE PYELOGRAM, URETEROSCOPY AND STENT PLACEMENT Right 12/11/2015   Procedure: RIGHT URETEROSCOPY/HOLMIUM LASER LITHOTRIPSY AND STONE REMOVAL removal and placement of double j stent;  Surgeon: Crist Fat, MD;  Location: [facility];  Service: Urology;  Laterality: Right;  . HOLMIUM LASER APPLICATION Right 12/11/2015   Procedure: HOLMIUM LASER APPLICATION;  Surgeon: Crist Fat, MD;  Location: [facility];  Service: Urology;  Laterality: Right;     Current Outpatient Prescriptions  Medication Sig Dispense Refill  . acetaminophen (TYLENOL) 500 MG tablet Take 500 mg by mouth every 6 (six) hours as needed for mild pain or moderate pain. pain    . albuterol (PROAIR HFA) 108 (90 Base) MCG/ACT inhaler INHALE 2 PUFFS INTO THE LUNGS EVERY 6 (SIX) HOURS AS NEEDED FOR WHEEZING OR SHORTNESS OF BREATH. 8.5 Inhaler 5  . budesonide-formoterol (SYMBICORT) 160-4.5 MCG/ACT inhaler Inhale 2 puffs into the lungs 2 (two) times daily. 1 Inhaler 12  . calcium citrate-vitamin D (CITRACAL+D) 315-200 MG-UNIT tablet Take 2 tablets by mouth daily. 100 tablet 3  . fluticasone (FLONASE) 50 MCG/ACT nasal spray Place 2 sprays into both nostrils daily. 16 g 0  . furosemide (LASIX) 40 MG tablet Take 1 tablet (40 mg total) by mouth every other day. 90 tablet 1  . hydrocortisone (PROCTOSOL HC) 2.5 % rectal cream PLACE 1 APPLICATION RECTALLY 2 TIMES DAILY. 28.35 g 5  . losartan (COZAAR) 100 MG tablet Take 1 tablet (100 mg total) by mouth daily.  90 tablet 1  . meclizine (ANTIVERT) 25 MG tablet Take 1 tablet (25 mg total) by mouth 3 (three) times daily as needed. 270 tablet 1  . metoprolol succinate (TOPROL-XL) 50 MG 24 hr tablet Take 1 tablet (50 mg total) by mouth daily. Take with or immediately following a meal. 90 tablet 1  . montelukast (SINGULAIR) 10 MG tablet Take 1 tablet (10 mg total) by mouth at bedtime. 90 tablet 2  . pantoprazole  (PROTONIX) 40 MG tablet TAKE 1 TABLET (40 MG TOTAL) BY MOUTH DAILY. 90 tablet 1  . pravastatin (PRAVACHOL) 80 MG tablet TAKE 1 TABLET (80 MG TOTAL) BY MOUTH DAILY. 30 tablet 5  . warfarin (COUMADIN) 4 MG tablet Take 1 tablet by mouth daily--EXCEPT on [facility] ONLY 1/2 tablet on Wednesdays. 31 tablet 2   No current facility-administered medications for this visit.     Allergies:   Penicillins; Cabbage; Shellfish allergy; Tomato; and Latex   Social History:  The patient  reports that she quit smoking about 27 years ago. Her smoking use included Cigarettes. She has never used smokeless tobacco. She reports that she does not drink alcohol or use drugs.   Family History:  The patient's family history includes Cancer in her mother; Colon cancer in her other; Diabetes in her brother; Heart Problems (age of onset: 13) in her other; Hypertension in her brother, sister, and sister.    ROS:  Please see the history of present illness.   Otherwise, review of systems is positive for Weight change, sweats, chest pain, palpitations, cough, abdominal pain, back pain, muscle pain.   All other systems are reviewed and negative.   PHYSICAL EXAM: VS:  BP 136/90   Pulse 80   Ht 5\' 3"  (1.6 m)   Wt 274 lb 9.6 oz (124.6 kg)   BMI 48.64 kg/m  , BMI Body mass index is 48.64 kg/m. GEN: Well nourished, well developed, in no acute distress  HEENT: normal  Neck: no JVD, carotid bruits, or masses Cardiac: RRR; no murmurs, rubs, or gallops,no edema  Respiratory:  clear to auscultation bilaterally, normal work of breathing GI: soft, nontender, nondistended, + BS MS: no deformity or atrophy  Skin: warm and dry Neuro:  Strength and sensation are intact Psych: euthymic mood, full affect  EKG:  EKG is ordered today. Personal review of the ekg ordered 10/28/15 shows sinus rhythm, RBBB, LAFB, rate 87   PAD Screen 10/28/2015  Previous PAD dx? No  Previous surgical procedure? Yes  Dates of procedures 09/16/15    Pain with walking? No  Feet/toe relief with dangling? No  Painful, non-healing ulcers? No  Extremities discolored? No      Recent Labs: 09/16/2015: ALT 25 09/21/2015: B Natriuretic Peptide 251.8 11/27/2015: Hemoglobin 9.4; Platelets 307 06/21/2016: BUN 19; Creatinine, Ser 0.90; Potassium 4.0; Sodium 143    Lipid Panel     Component Value Date/Time   CHOL 205 (H) 06/21/2016 1400   TRIG 165 (H) 06/21/2016 1400   HDL 44 06/21/2016 1400   CHOLHDL 4.7 (H) 06/21/2016 1400   CHOLHDL 4.9 04/08/2014 1353   VLDL 44 (H) 04/08/2014 1353   LDLCALC 128 (H) 06/21/2016 1400     Wt Readings from Last 3 Encounters:  07/25/16 274 lb 9.6 oz (124.6 kg)  06/21/16 271 lb 6.4 oz (123.1 kg)  05/02/16 270 lb 8 oz (122.7 kg)      Other studies Reviewed: Additional studies/ records that were reviewed today include: TTE 09/21/15  Review of the above  records today demonstrates:  - Left ventricle: The cavity size was normal. Systolic function was   mildly reduced. The estimated ejection fraction was in the range   of 45% to 50%. Wall motion was normal; there were no regional   wall motion abnormalities. Features are consistent with a   pseudonormal left ventricular filling pattern, with concomitant   abnormal relaxation and increased filling pressure (grade 2   diastolic dysfunction). - Left atrium: The atrium was moderately dilated.   ASSESSMENT AND PLAN:  1.  Chronic combined systolic and diastolic HF: Doing well without major complaints. Blood pressure is mildly elevated diastolic. Mychal Decarlo switch from metoprolol to carvedilol.   2. Hypertension: Diastolic blood pressure is elevated today. We'll plan to switch from metoprolol to carvedilol 12.5 mg.  3. Palpitations: Unclear cause. We'll fit her with a 30 day monitor.   Current medicines are reviewed at length with the patient today.   The patient does not have concerns regarding her medicines.  The following changes were made today:  Switch  metoprolol to carvedilol  Labs/ tests ordered today include:  No orders of the defined types were placed in this encounter.    Disposition:   FU with Apollonia Amini 6 months  Signed, Colby Catanese Jorja Loa, MD  07/25/2016 12:17 PM     [facility] [address] [place] [address] [phone] (office) [phone] (fax)

## 2016-08-03 ENCOUNTER — Ambulatory Visit (INDEPENDENT_AMBULATORY_CARE_PROVIDER_SITE_OTHER): Payer: Medicare Other

## 2016-08-03 ENCOUNTER — Encounter (INDEPENDENT_AMBULATORY_CARE_PROVIDER_SITE_OTHER): Payer: Self-pay

## 2016-08-03 DIAGNOSIS — R002 Palpitations: Secondary | ICD-10-CM

## 2016-08-22 ENCOUNTER — Ambulatory Visit (INDEPENDENT_AMBULATORY_CARE_PROVIDER_SITE_OTHER): Payer: Medicare Other | Admitting: Pharmacist

## 2016-08-22 DIAGNOSIS — I82409 Acute embolism and thrombosis of unspecified deep veins of unspecified lower extremity: Secondary | ICD-10-CM

## 2016-08-22 DIAGNOSIS — Z7901 Long term (current) use of anticoagulants: Secondary | ICD-10-CM

## 2016-08-22 DIAGNOSIS — Z86718 Personal history of other venous thrombosis and embolism: Secondary | ICD-10-CM | POA: Diagnosis not present

## 2016-08-22 LAB — POCT INR: INR: 2.9

## 2016-08-22 NOTE — Patient Instructions (Signed)
Patient instructed to take medications as defined in the Anti-coagulation Track section of this encounter.  Patient instructed to take today's dose.  Patient instructed to take 1 tablet of your blue-colored 4mg strength warfarin tablets by mouth once-daily at 6PM all days of week--EXCEPT on Wednesdays. On Wednesdays, take 1/2 tablet of your 4mg blue warfarin tablet(s).  Patient verbalized understanding of these instructions.    

## 2016-08-22 NOTE — Progress Notes (Signed)
Anticoagulation Management Caroline [name] is a 71 y.o. female who reports to the clinic for monitoring of warfarin treatment.    Indication: DVT  Duration: indefinite Supervising physician: Blanch Media  Anticoagulation Clinic Visit History: Patient does not report signs/symptoms of bleeding or thromboembolism  Other recent changes: No diet, medications, lifestyle changes endorsed by the patient to me.  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 Penicillins Anaphylaxis, Hives, Swelling and Rash    Has patient had a PCN reaction causing immediate rash, facial/tongue/throat swelling, SOB or lightheadedness with hypotension: Yes Has patient had a PCN reaction causing severe rash involving mucus membranes or skin necrosis: Yes Has patient had a PCN reaction that required hospitalization Yes Has patient had a PCN reaction occurring within the last 10 years: No If all of the above answers are "NO", then may proceed with Cephalosporin use.   . Cabbage Itching  . Shellfish Allergy Swelling  . Tomato Swelling  . Latex Itching and Rash   Prior to Admission medications   Medication Sig Start Date End Date Taking? Authorizing Provider  acetaminophen (TYLENOL) 500 MG tablet Take 500 mg by mouth every 6 (six) hours as needed for mild pain or moderate pain. pain   Yes [provider]  albuterol (PROAIR HFA) 108 (90 Base) MCG/ACT inhaler INHALE 2 PUFFS INTO THE LUNGS EVERY 6 (SIX) HOURS AS NEEDED FOR WHEEZING OR SHORTNESS OF BREATH. 03/08/16  Yes Rivet, Iris Pert,  MD  budesonide-formoterol (SYMBICORT) 160-4.5 MCG/ACT inhaler Inhale 2 puffs into the lungs 2 (two) times daily. 03/08/16  Yes Rivet, Iris Pert, MD  calcium citrate-vitamin D (CITRACAL+D) 315-200 MG-UNIT tablet Take 2 tablets by mouth daily. 06/21/16  Yes Rivet, Iris Pert, MD  carvedilol (COREG) 12.5 MG tablet Take 1 tablet (12.5 mg total) by mouth 2 (two) times daily. 07/25/16 10/23/16 Yes Camnitz, Will [name], MD  fluticasone (FLONASE) 50 MCG/ACT nasal spray Place 2 sprays into both nostrils daily. 05/02/16  Yes Rivet, Iris Pert, MD  furosemide (LASIX) 40 MG tablet Take 1 tablet (40 mg total) by mouth every other day. 06/21/16  Yes Rivet, Iris Pert, MD  hydrocortisone (PROCTOSOL HC) 2.5 % rectal cream PLACE 1 APPLICATION RECTALLY 2 TIMES DAILY. 06/21/16  Yes Rivet, Iris Pert, MD  losartan (COZAAR) 100 MG tablet Take 1 tablet (100 mg total) by mouth daily. 05/02/16  Yes Rivet, Iris Pert, MD  meclizine (ANTIVERT) 25 MG tablet Take 1 tablet (25 mg total) by mouth 3 (three) times daily as needed. 11/19/15  Yes Rivet, Iris Pert, MD  montelukast (SINGULAIR) 10 MG tablet Take 1 tablet (10 mg total) by mouth at bedtime. 02/19/16  Yes Rivet, Carly J, MD  pantoprazole (PROTONIX) 40 MG tablet TAKE 1 TABLET (40 MG TOTAL) BY MOUTH DAILY. 06/20/16  Yes Rivet, Iris Pert, MD  pravastatin (PRAVACHOL) 80 MG tablet TAKE 1 TABLET (80 MG TOTAL) BY MOUTH  DAILY. 05/11/16  Yes Rivet, Iris Pert, MD  warfarin (COUMADIN) 4 MG tablet Take 1 tablet by mouth daily--EXCEPT on [facility] ONLY 1/2 tablet on Wednesdays. 05/30/16  Yes Inez Catalina, MD   Past Medical History:  Diagnosis Date  . Arthritis   . Asthma   . Bronchitis   . CHF (congestive heart failure) (HCC)   . DVT, lower extremity, recurrent (HCC)    Patient had unprovoked PE on 2002 and DVT in right lower extremety 2008.  [name] GERD (gastroesophageal reflux disease)   . History of kidney stones   . Hypertension   . PE (pulmonary embolism)    Patient had unprovoked PE on 2002  . PONV  (postoperative nausea and vomiting)   . Vertigo    Social History   Social History  . Marital status: Divorced    Spouse name: N/A  . Number of children: N/A  . Years of education: N/A   Social History Main Topics  . Smoking status: Former Smoker    Types: Cigarettes    Quit date: 09/18/1988  . Smokeless tobacco: Never Used  . Alcohol use No  . Drug use: No  . Sexual activity: No   Other Topics Concern  . Not on file   Social History Narrative  . No narrative on file   Family History  Problem Relation Age of Onset  . Cancer Mother   . Hypertension Sister   . Hypertension Brother   . Diabetes Brother   . Hypertension Sister   . Colon cancer Other   . Heart Problems Other 34       sister's child, open heart surgery    ASSESSMENT Recent Results: The most recent result is correlated with 26 mg per week: Lab Results  Component Value Date   INR 2.90 08/22/2016   INR 2.30 07/25/2016   INR 2.50 06/27/2016    Anticoagulation Dosing: INR as of 08/22/2016 and Previous Warfarin Dosing Information    INR Dt INR Goal Wkly Tot Sun Mon Tue Wed Thu Fri Sat   08/22/2016 2.90 2.0-3.0 26 mg 4 mg 4 mg 4 mg 2 mg 4 mg 4 mg 4 mg    Previous description   Take 1 tablet of your blue-colored 4mg  strength warfarin tablets by mouth once-daily at 6PM all days of week--EXCEPT on Wednesdays. On Wednesdays, take 1/2 tablet of your 4mg  blue warfarin tablet(s).    Anticoagulation Warfarin Dose Instructions as of 08/22/2016      Total Sun Mon Tue Wed Thu Fri Sat   New Dose 26 mg 4 mg 4 mg 4 mg 2 mg 4 mg 4 mg 4 mg     (4 mg x 1)  (4 mg x 1)  (4 mg x 1)  (4 mg x 0.5)  (4 mg x 1)  (4 mg x 1)  (4 mg x 1)                         Description   Take 1 tablet of your blue-colored 4mg  strength warfarin tablets by mouth once-daily at 6PM all days of week--EXCEPT on Wednesdays. On Wednesdays, take 1/2 tablet of your 4mg  blue warfarin tablet(s).      INR today: Therapeutic  PLAN Weekly dose  was unchanged   Patient Instructions  Patient instructed to take medications as defined in the Anti-coagulation Track section of this encounter.  Patient instructed to take today's dose.  Patient instructed to take  1 tablet of your blue-colored 4mg  strength warfarin tablets by mouth once-daily at 6PM all days of week--EXCEPT on Wednesdays. On Wednesdays, take 1/2 tablet of your 4mg  blue warfarin tablet(s) Patient verbalized understanding of these instructions.     Patient advised to contact clinic or seek medical attention if signs/symptoms of bleeding or thromboembolism occur.  Patient verbalized understanding by repeating back information and was advised to contact me if further medication-related questions arise. Patient was also provided an information handout.  Follow-up Return in 4 weeks (on 09/19/2016) for Follow up INR at 0930h.  Gar Ponto PharmD, CACP, CPP  15 minutes spent face-to-face with the patient during the encounter. 50% of time spent on education. 50% of time was spent on fingerstick point of care INR sample collection, processing, results interpretation, documentation in to EPIC/CHL and [email or web address].

## 2016-09-03 ENCOUNTER — Other Ambulatory Visit: Payer: Self-pay | Admitting: Internal Medicine

## 2016-09-03 DIAGNOSIS — I824Y9 Acute embolism and thrombosis of unspecified deep veins of unspecified proximal lower extremity: Secondary | ICD-10-CM

## 2016-09-17 ENCOUNTER — Emergency Department (HOSPITAL_COMMUNITY)
Admission: EM | Admit: 2016-09-17 | Discharge: 2016-09-18 | Disposition: A | Discharge status: Home / Self Care | Payer: Medicare Other | Source: Home / Self Care | Attending: Emergency Medicine | Admitting: Emergency Medicine

## 2016-09-17 ENCOUNTER — Emergency Department (HOSPITAL_COMMUNITY)
Admission: EM | Admit: 2016-09-17 | Discharge: 2016-09-17 | Disposition: A | Discharge status: Home / Self Care | Payer: Medicare Other | Attending: Emergency Medicine | Admitting: Emergency Medicine

## 2016-09-17 ENCOUNTER — Encounter (HOSPITAL_COMMUNITY): Payer: Self-pay

## 2016-09-17 ENCOUNTER — Emergency Department (HOSPITAL_BASED_OUTPATIENT_CLINIC_OR_DEPARTMENT_OTHER)
Admit: 2016-09-17 | Discharge: 2016-09-17 | Disposition: A | Discharge status: Home / Self Care | Payer: Medicare Other | Attending: Emergency Medicine | Admitting: Emergency Medicine

## 2016-09-17 ENCOUNTER — Encounter (HOSPITAL_COMMUNITY): Payer: Self-pay | Admitting: Emergency Medicine

## 2016-09-17 DIAGNOSIS — Z91013 Allergy to seafood: Secondary | ICD-10-CM | POA: Insufficient documentation

## 2016-09-17 DIAGNOSIS — L03115 Cellulitis of right lower limb: Secondary | ICD-10-CM

## 2016-09-17 DIAGNOSIS — Z79899 Other long term (current) drug therapy: Secondary | ICD-10-CM | POA: Insufficient documentation

## 2016-09-17 DIAGNOSIS — I5042 Chronic combined systolic (congestive) and diastolic (congestive) heart failure: Secondary | ICD-10-CM | POA: Diagnosis not present

## 2016-09-17 DIAGNOSIS — Z880 Allergy status to penicillin: Secondary | ICD-10-CM

## 2016-09-17 DIAGNOSIS — Z7901 Long term (current) use of anticoagulants: Secondary | ICD-10-CM

## 2016-09-17 DIAGNOSIS — I110 Hypertensive heart disease with heart failure: Secondary | ICD-10-CM | POA: Insufficient documentation

## 2016-09-17 DIAGNOSIS — J45909 Unspecified asthma, uncomplicated: Secondary | ICD-10-CM

## 2016-09-17 DIAGNOSIS — T361X5A Adverse effect of cephalosporins and other beta-lactam antibiotics, initial encounter: Secondary | ICD-10-CM | POA: Diagnosis not present

## 2016-09-17 DIAGNOSIS — Z91040 Latex allergy status: Secondary | ICD-10-CM

## 2016-09-17 DIAGNOSIS — Z87891 Personal history of nicotine dependence: Secondary | ICD-10-CM | POA: Insufficient documentation

## 2016-09-17 DIAGNOSIS — I509 Heart failure, unspecified: Secondary | ICD-10-CM | POA: Insufficient documentation

## 2016-09-17 DIAGNOSIS — T7840XA Allergy, unspecified, initial encounter: Secondary | ICD-10-CM

## 2016-09-17 DIAGNOSIS — R2241 Localized swelling, mass and lump, right lower limb: Secondary | ICD-10-CM | POA: Diagnosis present

## 2016-09-17 DIAGNOSIS — R21 Rash and other nonspecific skin eruption: Secondary | ICD-10-CM

## 2016-09-17 DIAGNOSIS — M79609 Pain in unspecified limb: Secondary | ICD-10-CM

## 2016-09-17 LAB — CBC WITH DIFFERENTIAL/PLATELET
Basophils Absolute: 0 10*3/uL (ref 0.0–0.1)
Basophils Relative: 0 %
EOS PCT: 4 %
Eosinophils Absolute: 0.2 10*3/uL (ref 0.0–0.7)
HEMATOCRIT: 35.7 % — AB (ref 36.0–46.0)
HEMOGLOBIN: 10.7 g/dL — AB (ref 12.0–15.0)
LYMPHS ABS: 2.3 10*3/uL (ref 0.7–4.0)
LYMPHS PCT: 36 %
MCH: 24.4 pg — AB (ref 26.0–34.0)
MCHC: 30 g/dL (ref 30.0–36.0)
MCV: 81.3 fL (ref 78.0–100.0)
Monocytes Absolute: 0.4 10*3/uL (ref 0.1–1.0)
Monocytes Relative: 7 %
NEUTROS ABS: 3.4 10*3/uL (ref 1.7–7.7)
Neutrophils Relative %: 53 %
PLATELETS: 201 10*3/uL (ref 150–400)
RBC: 4.39 MIL/uL (ref 3.87–5.11)
RDW: 14.1 % (ref 11.5–15.5)
WBC: 6.4 10*3/uL (ref 4.0–10.5)

## 2016-09-17 LAB — BASIC METABOLIC PANEL
ANION GAP: 7 (ref 5–15)
BUN: 13 mg/dL (ref 6–20)
CHLORIDE: 106 mmol/L (ref 101–111)
CO2: 29 mmol/L (ref 22–32)
Calcium: 9.4 mg/dL (ref 8.9–10.3)
Creatinine, Ser: 0.91 mg/dL (ref 0.44–1.00)
GFR calc Af Amer: >60 mL/min (ref >60)
Glucose, Bld: 110 mg/dL — ABNORMAL HIGH (ref 65–99)
POTASSIUM: 4 mmol/L (ref 3.5–5.1)
SODIUM: 142 mmol/L (ref 135–145)

## 2016-09-17 LAB — PROTIME-INR
INR: 3.09
Prothrombin Time: 31.6 seconds — ABNORMAL HIGH (ref 11.4–15.2)

## 2016-09-17 MED ORDER — CEPHALEXIN 250 MG PO CAPS
500.0000 mg | ORAL_CAPSULE | Freq: Once | ORAL | Status: AC
  Administered 2016-09-17: 500 mg via ORAL
  Filled 2016-09-17: qty 2

## 2016-09-17 MED ORDER — CEPHALEXIN 500 MG PO CAPS: 500.0000 mg | ORAL_CAPSULE | Freq: Two times a day (BID) | ORAL | 0 refills | Status: DC

## 2016-09-17 MED ORDER — CLINDAMYCIN HCL 150 MG PO CAPS: 450.0000 mg | ORAL_CAPSULE | Freq: Three times a day (TID) | ORAL | 0 refills | Status: AC

## 2016-09-17 MED ORDER — DOXYCYCLINE HYCLATE 100 MG PO TABS: 100.0000 mg | ORAL_TABLET | Freq: Once | ORAL | Status: DC

## 2016-09-17 MED ORDER — CLINDAMYCIN HCL 150 MG PO CAPS
450.0000 mg | ORAL_CAPSULE | Freq: Once | ORAL | Status: AC
  Administered 2016-09-18: 450 mg via ORAL
  Filled 2016-09-17: qty 3

## 2016-09-17 MED ORDER — SULFAMETHOXAZOLE-TRIMETHOPRIM 800-160 MG PO TABS: 1.0000 | ORAL_TABLET | Freq: Once | ORAL | Status: DC

## 2016-09-17 MED ORDER — SULFAMETHOXAZOLE-TRIMETHOPRIM 800-160 MG PO TABS: 1.0000 | ORAL_TABLET | Freq: Two times a day (BID) | ORAL | 0 refills | Status: DC

## 2016-09-17 MED ORDER — DIPHENHYDRAMINE HCL 25 MG PO CAPS
25.0000 mg | ORAL_CAPSULE | Freq: Once | ORAL | Status: AC
  Administered 2016-09-18: 25 mg via ORAL
  Filled 2016-09-17: qty 1

## 2016-09-17 NOTE — ED Triage Notes (Signed)
Reports being seen earlier today for cellulitis to [facility].  Given dose of Keflex here.  When pharmacy filled medication she was told that med was kin to penicillin which she is allergic to.  Reports rash to face and left arm.  Small amount of red spots noted to cheeks.  Denies having any breathing issues.

## 2016-09-17 NOTE — ED Triage Notes (Signed)
Per Pt, Pt is coming from home with complaints of right knee pain and some bruising x 1 year. Pt reports some warmness to touch. Hx of blood thinner.

## 2016-09-17 NOTE — Progress Notes (Signed)
**  Preliminary report by tech**  Right lower extremity venous duplex complete. There is no obvious evidence of deep or superficial vein thrombosis involving the right lower extremity. All clearly visualized vessels appear patent and compressible. There is no evidence of a Baker's cyst on the right. Results were given to Dr. Madilyn Hook.  09/17/16 4:14 PM Olen Cordial RVT

## 2016-09-17 NOTE — ED Provider Notes (Signed)
[facility]EM[facility]ENCY DEPT Provider Note   CSN: [id number] Arrival date & time: 09/17/16  1009     History   Chief Complaint Chief Complaint  Patient presents with  . Bleeding/Bruising    HPI Caroline [name] is a 71 y.o. female.  The history is provided by the patient. No language interpreter was used.    Caroline [name] is a 71 y.o. female who presents to the Emergency Department complaining of bleeding/bruising.  She reports swelling and bruising to the right calf/leg for the last year, worsening over the last few weeks.  No fevers, chest pain, sob, numbness.  No known injuries.  She feels like blood is running inside of it.  She is on coumadin for hx/o dvt.  Sxs are mild and constant in nature.    Past Medical History:  Diagnosis Date  . Arthritis   . Asthma   . Bronchitis   . CHF (congestive heart failure) (HCC)   . DVT, lower extremity, recurrent (HCC)    Patient had unprovoked PE on 2002 and DVT in right lower extremety 2008.  [name] GERD (gastroesophageal reflux disease)   . History of kidney stones   . Hypertension   . PE (pulmonary embolism)    Patient had unprovoked PE on 2002  . PONV (postoperative nausea and vomiting)   . Vertigo     Patient Active Problem List   Diagnosis Date Noted  . Morbid obesity with BMI of 45.0-49.9, adult (HCC) 05/02/2016  . Chronic combined systolic and diastolic CHF, NYHA class 1 (HCC) 09/21/2015  . History of exertional chest pain 08/26/2013  . DVT, lower extremity, recurrent (HCC)   . Vertigo 09/13/2011  . Healthcare maintenance 09/13/2011  . Allergic rhinitis 06/21/2006  . Hyperlipidemia 06/10/2006  . Essential hypertension 06/10/2006  . Moderate persistent asthma 06/10/2006    Past Surgical History:  Procedure Laterality Date  . CYSTOSCOPY W/ URETERAL STENT PLACEMENT Right 09/16/2015   Procedure: CYSTOSCOPY WITH RETROGRADE PYELOGRAM/URETERAL STENT PLACEMENT;  Surgeon: Alfredo Martinez, MD;  Location: [facility];  Service: Urology;   Laterality: Right;  . CYSTOSCOPY WITH RETROGRADE PYELOGRAM, URETEROSCOPY AND STENT PLACEMENT Right 12/11/2015   Procedure: RIGHT URETEROSCOPY/HOLMIUM LASER LITHOTRIPSY AND STONE REMOVAL removal and placement of double j stent;  Surgeon: Crist Fat, MD;  Location: [facility];  Service: Urology;  Laterality: Right;  . HOLMIUM LASER APPLICATION Right 12/11/2015   Procedure: HOLMIUM LASER APPLICATION;  Surgeon: Crist Fat, MD;  Location: [facility];  Service: Urology;  Laterality: Right;    OB History    No data available       Home Medications    Prior to Admission medications   Medication Sig Start Date End Date Taking? Authorizing Provider  acetaminophen (TYLENOL) 500 MG tablet Take 500 mg by mouth every 6 (six) hours as needed for mild pain or moderate pain. pain    [provider]  albuterol (PROAIR HFA) 108 (90 Base) MCG/ACT inhaler INHALE 2 PUFFS INTO THE LUNGS EVERY 6 (SIX) HOURS AS NEEDED FOR WHEEZING OR SHORTNESS OF BREATH. 03/08/16   Rivet, Iris Pert, MD  budesonide-formoterol (SYMBICORT) 160-4.5 MCG/ACT inhaler Inhale 2 puffs into the lungs 2 (two) times daily. 03/08/16   Rivet, Iris Pert, MD  calcium citrate-vitamin D (CITRACAL+D) 315-200 MG-UNIT tablet Take 2 tablets by mouth daily. 06/21/16   Rivet, Iris Pert, MD  carvedilol (COREG) 12.5 MG tablet Take 1 tablet (12.5 mg total) by mouth 2 (two) times daily. 07/25/16 10/23/16  Regan Lemming, MD  cephALEXin (KEFLEX) 500 MG capsule Take 1 capsule (500 mg total) by mouth 2 (two) times daily. 09/17/16   Tilden Fossa, MD  fluticasone [facility]) 50 MCG/ACT nasal spray Place 2 sprays into both nostrils daily. 05/02/16   Rivet, Iris Pert, MD  furosemide (LASIX) 40 MG tablet Take 1 tablet (40 mg total) by mouth every other day. 06/21/16   Rivet, Iris Pert, MD  hydrocortisone (PROCTOSOL HC) 2.5 % rectal cream PLACE 1 APPLICATION RECTALLY 2 TIMES DAILY. 06/21/16   Rivet, Iris Pert, MD  losartan (COZAAR) 100 MG tablet Take 1 tablet (100 mg  total) by mouth daily. 05/02/16   Rivet, Iris Pert, MD  meclizine (ANTIVERT) 25 MG tablet Take 1 tablet (25 mg total) by mouth 3 (three) times daily as needed. 11/19/15   Rivet, Iris Pert, MD  montelukast (SINGULAIR) 10 MG tablet Take 1 tablet (10 mg total) by mouth at bedtime. 02/19/16   Rivet, Iris Pert, MD  pantoprazole (PROTONIX) 40 MG tablet TAKE 1 TABLET (40 MG TOTAL) BY MOUTH DAILY. 06/20/16   Rivet, Iris Pert, MD  pravastatin (PRAVACHOL) 80 MG tablet TAKE 1 TABLET (80 MG TOTAL) BY MOUTH DAILY. 05/11/16   Rivet, Iris Pert, MD  warfarin (COUMADIN) 4 MG tablet TAKE 1 TABLET BY MOUTH DAILY--EXCEPT ON WEDNESDAYS--TAKE ONLY 1/2 TABLET ON WEDNESDAYS. 09/06/16   Molt, Bethany, DO    Family History Family History  Problem Relation Age of Onset  . Cancer Mother   . Hypertension Sister   . Hypertension Brother   . Diabetes Brother   . Hypertension Sister   . Colon cancer Other   . Heart Problems Other 34       sister's child, open heart surgery    Social History Social History  Substance Use Topics  . Smoking status: Former Smoker    Types: Cigarettes    Quit date: 09/18/1988  . Smokeless tobacco: Never Used  . Alcohol use No     Allergies   Penicillins; Cabbage; Shellfish allergy; Tomato; and Latex   Review of Systems Review of Systems  All other systems reviewed and are negative.    Physical Exam Updated Vital Signs BP (!) 146/63   Pulse 66   Temp 98.6 F (37 C) (Oral)   Resp 14   Ht 5\' 3"  (1.6 m)   Wt 124.7 kg (275 lb)   SpO2 96%   BMI 48.71 kg/m   Physical Exam  Constitutional: She is oriented to person, place, and time. She appears well-developed and well-nourished.  HENT:  Head: Normocephalic and atraumatic.  Cardiovascular: Normal rate and regular rhythm.   No murmur heard. Pulmonary/Chest: Effort normal and breath sounds normal. No respiratory distress.  Abdominal: Soft. There is no tenderness. There is no rebound and no guarding.  Musculoskeletal:  2+ DP pulses  bilaterally.  There is erythema and swelling to the right distal calf/shin.  Right mid calf with induration and dark skin discoloration and thickening.  No ankle or knee tenderness.    Neurological: She is alert and oriented to person, place, and time.  Skin: Skin is warm and dry.  Psychiatric: She has a normal mood and affect. Her behavior is normal.  Nursing note and vitals reviewed.    ED Treatments / Results  Labs (all labs ordered are listed, but only abnormal results are displayed) Labs Reviewed  BASIC METABOLIC PANEL - Abnormal; Notable for the following:       Result Value   Glucose, Bld 110 (*)  All other components within normal limits  CBC WITH DIFFERENTIAL/PLATELET - Abnormal; Notable for the following:    Hemoglobin 10.7 (*)    HCT 35.7 (*)    MCH 24.4 (*)    All other components within normal limits  PROTIME-INR - Abnormal; Notable for the following:    Prothrombin Time 31.6 (*)    All other components within normal limits    EKG  EKG Interpretation None       Radiology No results found.  Procedures Procedures (including critical care time)  Medications Ordered in ED Medications  cephALEXin (KEFLEX) capsule 500 mg (500 mg Oral Given 09/17/16 1637)     Initial Impression / Assessment and Plan / ED Course  I have reviewed the triage vital signs and the nursing notes.  Pertinent labs & imaging results that were available during my care of the patient were reviewed by me and considered in my medical decision making (see chart for details).     Patient here for evaluation of right lower extremity bruising. She does have erythema on examination concerning for cellulitis. DVT study is negative for acute thrombus. Her coumadin level is slightly supratherapeutic at 3.09. Discussed holding her evening dose. Will start on antibiotics for potential cellulitis. Discussed outpatient follow-up and return precautions.  Final Clinical Impressions(s) / ED Diagnoses     Final diagnoses:  Cellulitis of right lower extremity    New Prescriptions Discharge Medication List as of 09/17/2016  4:22 PM    START taking these medications   Details  cephALEXin (KEFLEX) 500 MG capsule Take 1 capsule (500 mg total) by mouth 2 (two) times daily., Starting Sat 09/17/2016, Print         Tilden Fossa, MD 09/17/16 [phone]

## 2016-09-17 NOTE — Discharge Instructions (Signed)
As discussed, start taking this new antibiotic in the morning and 3 times a day for a week. Follow up with your primary care provider in 2 days for recheck.  Take your entire course of antibiotics even if you improve. Return if you experience worsening redness, pain, swelling or other new concerning symptoms in the meantime.

## 2016-09-17 NOTE — ED Notes (Signed)
Pt transported to [facility]

## 2016-09-17 NOTE — Discharge Instructions (Signed)
Your coumadin level (INR) was 3.09 today.  Do not take your coumadin tonight.  You can take your regular Sunday dose.  Contact your coumadin clinic for close follow up for recheck.   Get rechecked immediately if you develop fevers or new concerning symptoms.

## 2016-09-17 NOTE — ED Provider Notes (Signed)
[facility] Provider Note   CSN: [id number] Arrival date & time: 09/17/16  2052     History   Chief Complaint Chief Complaint  Patient presents with  . Allergic Reaction    HPI Caroline [name] is a 71 y.o. female  Erythematous papules to the cheeks and forearms bilaterally after taking an initial dose of Keflex in the emergency department earlier today. She filled her prescription at the pharmacy and was told that she had allergy to penicillin which may have been why she reacted to cephalosporin. She reports that she has had severe reactions penicillin in the past. She is being treated for lower extremity cellulitis. She denies any difficulty breathing, swelling, and reports that the rash has started to improve. It was pruritic. She is currently  reporting overall improvement since onset. HPI  Past Medical History:  Diagnosis Date  . Arthritis   . Asthma   . Bronchitis   . CHF (congestive heart failure) (HCC)   . DVT, lower extremity, recurrent (HCC)    Patient had unprovoked PE on 2002 and DVT in right lower extremety 2008.  [name] GERD (gastroesophageal reflux disease)   . History of kidney stones   . Hypertension   . PE (pulmonary embolism)    Patient had unprovoked PE on 2002  . PONV (postoperative nausea and vomiting)   . Vertigo     Patient Active Problem List   Diagnosis Date Noted  . Morbid obesity with BMI of 45.0-49.9, adult (HCC) 05/02/2016  . Chronic combined systolic and diastolic CHF, NYHA class 1 (HCC) 09/21/2015  . History of exertional chest pain 08/26/2013  . DVT, lower extremity, recurrent (HCC)   . Vertigo 09/13/2011  . Healthcare maintenance 09/13/2011  . Allergic rhinitis 06/21/2006  . Hyperlipidemia 06/10/2006  . Essential hypertension 06/10/2006  . Moderate persistent asthma 06/10/2006    Past Surgical History:  Procedure Laterality Date  . CYSTOSCOPY W/ URETERAL STENT PLACEMENT Right 09/16/2015   Procedure: CYSTOSCOPY WITH RETROGRADE  PYELOGRAM/URETERAL STENT PLACEMENT;  Surgeon: Alfredo Martinez, MD;  Location: [facility];  Service: Urology;  Laterality: Right;  . CYSTOSCOPY WITH RETROGRADE PYELOGRAM, URETEROSCOPY AND STENT PLACEMENT Right 12/11/2015   Procedure: RIGHT URETEROSCOPY/HOLMIUM LASER LITHOTRIPSY AND STONE REMOVAL removal and placement of double j stent;  Surgeon: Crist Fat, MD;  Location: [facility];  Service: Urology;  Laterality: Right;  . HOLMIUM LASER APPLICATION Right 12/11/2015   Procedure: HOLMIUM LASER APPLICATION;  Surgeon: Crist Fat, MD;  Location: [facility];  Service: Urology;  Laterality: Right;    OB History    No data available       Home Medications    Prior to Admission medications   Medication Sig Start Date End Date Taking? Authorizing Provider  acetaminophen (TYLENOL) 500 MG tablet Take 500 mg by mouth every 6 (six) hours as needed for mild pain or moderate pain. pain    [provider]  albuterol (PROAIR HFA) 108 (90 Base) MCG/ACT inhaler INHALE 2 PUFFS INTO THE LUNGS EVERY 6 (SIX) HOURS AS NEEDED FOR WHEEZING OR SHORTNESS OF BREATH. 03/08/16   Rivet, Iris Pert, MD  budesonide-formoterol (SYMBICORT) 160-4.5 MCG/ACT inhaler Inhale 2 puffs into the lungs 2 (two) times daily. 03/08/16   Rivet, Iris Pert, MD  calcium citrate-vitamin D (CITRACAL+D) 315-200 MG-UNIT tablet Take 2 tablets by mouth daily. 06/21/16   Rivet, Iris Pert, MD  carvedilol (COREG) 12.5 MG tablet Take 1 tablet (12.5 mg total) by mouth 2 (two) times daily. 07/25/16 10/23/16  Camnitz, Will [name], MD  cephALEXin (KEFLEX) 500 MG capsule Take 1 capsule (500 mg total) by mouth 2 (two) times daily. 09/17/16   Tilden Fossa, MD  clindamycin (CLEOCIN) 150 MG capsule Take 3 capsules (450 mg total) by mouth 3 (three) times daily. 09/17/16 09/24/16  Mathews Robinsons B, PA-C  fluticasone (FLONASE) 50 MCG/ACT nasal spray Place 2 sprays into both nostrils daily. 05/02/16   Rivet, Iris Pert, MD  furosemide (LASIX) 40 MG tablet Take 1 tablet  (40 mg total) by mouth every other day. 06/21/16   Rivet, Iris Pert, MD  hydrocortisone (PROCTOSOL HC) 2.5 % rectal cream PLACE 1 APPLICATION RECTALLY 2 TIMES DAILY. 06/21/16   Rivet, Iris Pert, MD  losartan (COZAAR) 100 MG tablet Take 1 tablet (100 mg total) by mouth daily. 05/02/16   Rivet, Iris Pert, MD  meclizine (ANTIVERT) 25 MG tablet Take 1 tablet (25 mg total) by mouth 3 (three) times daily as needed. 11/19/15   Rivet, Iris Pert, MD  montelukast (SINGULAIR) 10 MG tablet Take 1 tablet (10 mg total) by mouth at bedtime. 02/19/16   Rivet, Iris Pert, MD  pantoprazole (PROTONIX) 40 MG tablet TAKE 1 TABLET (40 MG TOTAL) BY MOUTH DAILY. 06/20/16   Rivet, Iris Pert, MD  pravastatin (PRAVACHOL) 80 MG tablet TAKE 1 TABLET (80 MG TOTAL) BY MOUTH DAILY. 05/11/16   Rivet, Iris Pert, MD  warfarin (COUMADIN) 4 MG tablet TAKE 1 TABLET BY MOUTH DAILY--EXCEPT ON WEDNESDAYS--TAKE ONLY 1/2 TABLET ON WEDNESDAYS. 09/06/16   Molt, Bethany, DO    Family History Family History  Problem Relation Age of Onset  . Cancer Mother   . Hypertension Sister   . Hypertension Brother   . Diabetes Brother   . Hypertension Sister   . [name] cancer Other   . Heart Problems Other 34       sister's child, open heart surgery    Social History Social History  Substance Use Topics  . Smoking status: Former Smoker    Types: Cigarettes    Quit date: 09/18/1988  . Smokeless tobacco: Never Used  . Alcohol use No     Allergies   Penicillins; Cabbage; Shellfish allergy; Tomato; and Latex   Review of Systems Review of Systems  Constitutional: Negative for diaphoresis.  HENT: Negative for drooling, facial swelling, sore throat, tinnitus, trouble swallowing and voice change.   Respiratory: Negative for cough, choking, chest tightness, shortness of breath, wheezing and stridor.   Cardiovascular: Negative for chest pain and palpitations.  Gastrointestinal: Negative for abdominal pain, nausea and vomiting.  Skin: Positive for rash. Negative for  color change, pallor and wound.  Neurological: Negative for dizziness, light-headedness and headaches.     Physical Exam Updated Vital Signs BP (!) 152/87 (BP Location: Right Arm)   Pulse 83   Temp 97.7 F (36.5 C) (Oral)   Resp 18   Ht  (1.6 m)   Wt 124.7 kg (275 lb)   SpO2 96%   BMI 48.71 kg/m   Physical Exam  Constitutional: She appears well-developed and well-nourished. No distress.  Afebrile, nontoxic-appearing, sitting comfortable in chair in no acute distress.  HENT:  Head: Normocephalic and atraumatic.  Mouth/Throat: Oropharynx is clear and moist. No oropharyngeal exudate.  Eyes: Conjunctivae and EOM are normal. Right eye exhibits no discharge. Left eye exhibits no discharge.  Neck: Normal range of motion. Neck supple.  Cardiovascular: Normal rate, regular rhythm, normal heart sounds and intact distal pulses.   Pulmonary/Chest: Effort normal and breath sounds  normal. No respiratory distress. She has no wheezes. She has no rales. She exhibits no tenderness.  Abdominal: She exhibits no distension.  Musculoskeletal: Normal range of motion.  Neurological: She is alert.  Skin: Skin is warm and dry. Rash noted. She is not diaphoretic. No erythema. No pallor.  Erythematous papules to the cheeks and forearms bilaterally.  Psychiatric: She has a normal mood and affect.  Nursing note and vitals reviewed.    ED Treatments / Results  Labs (all labs ordered are listed, but only abnormal results are displayed) Labs Reviewed - No data to display  EKG  EKG Interpretation None       Radiology No results found.  Procedures Procedures (including critical care time)  Medications Ordered in ED Medications  diphenhydrAMINE (BENADRYL) capsule 25 mg (25 mg Oral Given 09/18/16 0011)  clindamycin (CLEOCIN) capsule 450 mg (450 mg Oral Given 09/18/16 0010)     Initial Impression / Assessment and Plan / ED Course  I have reviewed the triage vital signs and the nursing  notes.  Pertinent labs & imaging results that were available during my care of the patient were reviewed by me and considered in my medical decision making (see chart for details).    Patient with [facility] allergy presents with a rash secondary to initiating Keflex earlier today.  She reported improvement while in the emergency department. On my assessment her rash was minimal and improving. No facial swelling, no airway compromise or distress.  Patient antibiotic for lower extremity cellulitis was replaced with clindamycin. Patient was given Benadryl while in the emergency department for pruritus and improved.  Discharge home with new antibiotic and close follow-up with PCP.   Discussed strict return precautions and advised to return to the emergency department if experiencing any new or worsening symptoms. Instructions were understood and patient agreed with discharge plan. Final Clinical Impressions(s) / ED Diagnoses   Final diagnoses:  Rash  Allergic reaction to drug, initial encounter    New Prescriptions Discharge Medication List as of 09/17/2016 11:59 PM    START taking these medications   Details  clindamycin (CLEOCIN) 150 MG capsule Take 3 capsules (450 mg total) by mouth 3 (three) times daily., Starting Sat 09/17/2016, Until Sat 09/24/2016, Print         Georgiana Shore, PA-C 09/18/16 1025    Marily Memos, MD 09/18/16 2352

## 2016-09-18 DIAGNOSIS — L03115 Cellulitis of right lower limb: Secondary | ICD-10-CM | POA: Diagnosis not present

## 2016-09-19 ENCOUNTER — Other Ambulatory Visit: Payer: Self-pay

## 2016-09-19 ENCOUNTER — Ambulatory Visit (INDEPENDENT_AMBULATORY_CARE_PROVIDER_SITE_OTHER): Payer: Medicare Other | Admitting: Pharmacist

## 2016-09-19 DIAGNOSIS — Z86718 Personal history of other venous thrombosis and embolism: Secondary | ICD-10-CM

## 2016-09-19 DIAGNOSIS — Z7901 Long term (current) use of anticoagulants: Secondary | ICD-10-CM

## 2016-09-19 DIAGNOSIS — I82409 Acute embolism and thrombosis of unspecified deep veins of unspecified lower extremity: Secondary | ICD-10-CM | POA: Diagnosis not present

## 2016-09-19 LAB — POCT INR: INR: 3.5

## 2016-09-19 NOTE — Progress Notes (Signed)
Anticoagulation Management Caroline [name] is a 71 y.o. female who reports to the clinic for monitoring of warfarin treatment.    Indication: DVT  Duration: indefinite Supervising physician: Earl Lagos  Anticoagulation Clinic Visit History: Patient does not report signs/symptoms of bleeding or thromboembolism Other recent changes: Started taking clindamycin for leg infection. No other changes in diet, medications, lifestyle reported. Anticoagulation Episode Summary    Current INR goal:   2.0-3.0  TTR:   87.9 % (1.6 y)  Next INR check:   09/23/2016  INR from last check:   3.5! (09/19/2016)  Weekly max warfarin dose:     Target end date:     INR check location:   [facility]  Preferred lab:     Send INR reminders to:      Indications   DVT lower extremity recurrent (HCC) [I82.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 Penicillins Anaphylaxis, Hives, Swelling and Rash    Has patient had a PCN reaction causing immediate rash, facial/tongue/throat swelling, SOB or lightheadedness with hypotension: Yes Has patient had a PCN reaction causing severe rash involving mucus membranes or skin necrosis: Yes Has patient had a PCN reaction that required hospitalization Yes Has patient had a PCN reaction occurring within the last 10 years: No If all of the above answers are "NO", then may proceed with Cephalosporin use.   . Cabbage Itching  . Shellfish Allergy Swelling  . Tomato Swelling  . Latex Itching and Rash   Prior to Admission medications   Medication Sig Start Date End Date Taking? Authorizing Provider  acetaminophen (TYLENOL) 500 MG tablet Take 500 mg by mouth every 6 (six) hours as needed for mild pain or moderate pain. pain   Yes [provider]  albuterol (PROAIR HFA) 108 (90 Base) MCG/ACT inhaler INHALE 2 PUFFS INTO THE LUNGS EVERY 6 (SIX) HOURS AS NEEDED FOR WHEEZING OR SHORTNESS OF  BREATH. 03/08/16  Yes Rivet, Iris Pert, MD  budesonide-formoterol (SYMBICORT) 160-4.5 MCG/ACT inhaler Inhale 2 puffs into the lungs 2 (two) times daily. 03/08/16  Yes Rivet, Iris Pert, MD  calcium citrate-vitamin D (CITRACAL+D) 315-200 MG-UNIT tablet Take 2 tablets by mouth daily. 06/21/16  Yes Rivet, Iris Pert, MD  carvedilol (COREG) 12.5 MG tablet Take 1 tablet (12.5 mg total) by mouth 2 (two) times daily. 07/25/16 10/23/16 Yes Camnitz, Will [name], MD  cephALEXin (KEFLEX) 500 MG capsule Take 1 capsule (500 mg total) by mouth 2 (two) times daily. 09/17/16  Yes Tilden Fossa, MD  clindamycin (CLEOCIN) 150 MG capsule Take 3 capsules (450 mg total) by mouth 3 (three) times daily. 09/17/16 09/24/16 Yes Mathews Robinsons B, PA-C  fluticasone (FLONASE) 50 MCG/ACT nasal spray Place 2 sprays into both nostrils daily. 05/02/16  Yes Rivet, Iris Pert, MD  furosemide (LASIX) 40 MG tablet Take 1 tablet (40 mg total) by mouth every other day. 06/21/16  Yes Rivet, Iris Pert, MD  hydrocortisone (PROCTOSOL HC) 2.5 % rectal cream PLACE 1 APPLICATION RECTALLY 2 TIMES DAILY. 06/21/16  Yes Rivet, Iris Pert, MD  losartan (COZAAR) 100 MG tablet Take 1 tablet (100 mg total) by mouth daily. 05/02/16  Yes Rivet, Iris Pert, MD  meclizine (ANTIVERT) 25 MG tablet Take 1 tablet (25 mg total) by mouth 3 (three) times daily as needed. 11/19/15  Yes Rivet, Carly J, MD  montelukast (SINGULAIR) 10 MG tablet Take 1 tablet (  10 mg total) by mouth at bedtime. 02/19/16  Yes Rivet, Carly J, MD  pantoprazole (PROTONIX) 40 MG tablet TAKE 1 TABLET (40 MG TOTAL) BY MOUTH DAILY. 06/20/16  Yes Rivet, Iris Pert, MD  pravastatin (PRAVACHOL) 80 MG tablet TAKE 1 TABLET (80 MG TOTAL) BY MOUTH DAILY. 05/11/16  Yes Rivet, Iris Pert, MD  warfarin (COUMADIN) 4 MG tablet TAKE 1 TABLET BY MOUTH DAILY--EXCEPT ON WEDNESDAYS--TAKE ONLY 1/2 TABLET ON WEDNESDAYS. 09/06/16  Yes Molt, Bethany, DO   Past Medical History:  Diagnosis Date  . Arthritis   . Asthma   . Bronchitis   . CHF  (congestive heart failure) (HCC)   . DVT, lower extremity, recurrent (HCC)    Patient had unprovoked PE on 2002 and DVT in right lower extremety 2008.  [name] GERD (gastroesophageal reflux disease)   . History of kidney stones   . Hypertension   . PE (pulmonary embolism)    Patient had unprovoked PE on 2002  . PONV (postoperative nausea and vomiting)   . Vertigo    Social History   Social History  . Marital status: Divorced    Spouse name: N/A  . Number of children: N/A  . Years of education: N/A   Social History Main Topics  . Smoking status: Former Smoker    Types: Cigarettes    Quit date: 09/18/1988  . Smokeless tobacco: Never Used  . Alcohol use No  . Drug use: No  . Sexual activity: No   Other Topics Concern  . Not on file   Social History Narrative  . No narrative on file   Family History  Problem Relation Age of Onset  . Cancer Mother   . Hypertension Sister   . Hypertension Brother   . Diabetes Brother   . Hypertension Sister   . Colon cancer Other   . Heart Problems Other 34       sister's child, open heart surgery    ASSESSMENT Recent Results: The most recent result is correlated with 26 mg per week: Lab Results  Component Value Date   INR 3.5 09/19/2016   INR 3.09 09/17/2016   INR 2.90 08/22/2016    Anticoagulation Dosing: INR as of 09/19/2016 and Previous Warfarin Dosing Information    INR Dt INR Goal Wkly Tot Sun Mon Tue Wed Thu Fri Sat   09/19/2016 3.5 2.0-3.0 26 mg 4 mg 4 mg 4 mg 2 mg 4 mg 4 mg 4 mg    Previous description   Take 1 tablet of your blue-colored 4mg  strength warfarin tablets by mouth once-daily at 6PM all days of week--EXCEPT on Wednesdays. On Wednesdays, take 1/2 tablet of your 4mg  blue warfarin tablet(s).    Anticoagulation Warfarin Dose Instructions as of 09/19/2016      Total Sun Mon Tue Wed Thu Fri Sat   New Dose 22 mg 4 mg 2 mg 4 mg 2 mg 4 mg 2 mg 4 mg     (4 mg x 1)  (4 mg x 0.5)  (4 mg x 1)  (4 mg x 0.5)  (4 mg x 1)   (4 mg x 0.5)  (4 mg x 1)                         Description   Take 1 tablet of your blue-colored 4mg  strength warfarin tablets by mouth once-daily at 6PM all days of week--EXCEPT on Mondays, Wednesdays and Fridays, take 1/2 tablet of your 4mg   blue warfarin tablet(s).      INR today: Supratherapeutic  PLAN Weekly dose was decreased by 15% to 22 mg per week  Patient Instructions  Patient instructed to take medications as defined in the Anti-coagulation Track section of this encounter.  Patient instructed to take today's dose.  Patient instructed to take 1 tablet of your blue-colored  strength warfarin tablets by mouth once-daily at 6PM all days of week--EXCEPT on Mondays, Wednesdays and Fridays, take 1/2 tablet of your  blue warfarin tablet(s). Patient verbalized understanding of these instructions.   Patient advised to contact clinic or seek medical attention if signs/symptoms of bleeding or thromboembolism occur.  Patient verbalized understanding by repeating back information and was advised to contact me if further medication-related questions arise. Patient was also provided an information handout.  Follow-up Return in 4 days (on 09/23/2016) for Follow up INR at 1000.  Roderic Scarce Zigmund Daniel, PharmD PGY1 Pharmacy Resident Pager: [phone]  15 minutes spent face-to-face with the patient during the encounter. 50% of time spent on education. 50% of time was spent on point of care fingerstick INR test, processing, results interpretation, dose adjustment, and documentation in [facility] and [email or web address].

## 2016-09-19 NOTE — Progress Notes (Signed)
INTERNAL MEDICINE TEACHING ATTENDING ADDENDUM - Imaan Padgett M.D  Duration- indefinite, Indication- PE, DVT, INR- supra therapeutic. Agree with pharmacy recommendations as outlined in their note.      

## 2016-09-19 NOTE — Patient Instructions (Signed)
Patient instructed to take medications as defined in the Anti-coagulation Track section of this encounter.  Patient instructed to take today's dose.  Patient instructed to take 1 tablet of your blue-colored 4mg  strength warfarin tablets by mouth once-daily at 6PM all days of week--EXCEPT on Mondays, Wednesdays and Fridays, take 1/2 tablet of your 4mg  blue warfarin tablet(s). Patient verbalized understanding of these instructions.

## 2016-09-20 ENCOUNTER — Other Ambulatory Visit: Payer: Self-pay | Admitting: *Deleted

## 2016-09-20 ENCOUNTER — Ambulatory Visit (INDEPENDENT_AMBULATORY_CARE_PROVIDER_SITE_OTHER): Payer: Medicare Other | Admitting: Internal Medicine

## 2016-09-20 ENCOUNTER — Encounter: Payer: Self-pay | Admitting: *Deleted

## 2016-09-20 ENCOUNTER — Encounter: Payer: Self-pay | Admitting: Internal Medicine

## 2016-09-20 ENCOUNTER — Telehealth: Payer: Self-pay | Admitting: Cardiology

## 2016-09-20 DIAGNOSIS — L03115 Cellulitis of right lower limb: Secondary | ICD-10-CM

## 2016-09-20 DIAGNOSIS — Z87891 Personal history of nicotine dependence: Secondary | ICD-10-CM

## 2016-09-20 NOTE — Patient Outreach (Signed)
[facility]) Care Management  09/20/2016  Caroline [name] [birth year] [id number]   Telephone Screen  Referral Date: 09/19/09 Referral Source: [facility] (ED Census) Referral Reason: HTN Insurance: U[employer] Medicare, [place]   Outreach attempt to patient and HIPAA verified. Patient lives alone. She doesn't have a significant caregiver. She stated, she would have to be placed in a nursing home, if her health declines. She depends on members in her church to assist with transportation to her medical appointments. Per EMR, patient had 2 visits on 09/17/16. The first visit was for lower extremity edema. The 2nd visit was related to rash d/t antibiotics, prescribed by [facility] ED. The antibiotic was changed to Cleocin, which she continues to take until 09/24/16. Patient had an INR checked on 09/16/16. Her INR was 3.5, which is considered to be elevated for the patient. Patient reported, the doctor thinks the increase in her INR is from the effects of the antibiotic. Warfarin was adjusted, per EMR. During our conversation, patient describe pain, draining inside of her leg, and increase swelling. She wanted to end our conversation to contact her doctor. RN CM encouraged patient to make sure to contact her doctor about her symptoms.  According to EMR, patient has a history of heart failure. Patient denied having a history of heart failure during the screening assessment. RN CM Dorann Lodge) called the nurse at the patient's cardiologist office. Spoke with Norval Morton regarding patient's symptoms. Cordelia Pen stated to have the patient call the office, because she didn't have consent to speak with RN CM Dorann Lodge). RN CM Dorann Lodge) called patient to give her the instructions provided by [facility]. Patient did not answer her phone, HIPAA compliant voicemail left for patient.  Update: 09/20/16 According to EMR, patient arrived to doctor's office at 1530 on 09/20/16.   09/21/16:  Patient stated at her MD's visit on 09/20/16, the  doctor continued her Cleocin. She has a follow-up visit scheduled on 09/1716. Patient believes her symptoms are improving. She verbalized elevating her leg to assist with decreasing the pain and swelling.   Plan: RN CM will send referral to [facility] RN for further in home eval/assessment of care needs and management of chronic conditions. RN CM will send [facility] SW referral for possible assistance with community resources related to transportation. RN CM will send [facility] pharmacy referral for medication management. RN CM advised patient to contact RNCM for any needs or concerns.  Wynelle Cleveland, RN, BSN, MHA/MSL, [facility] Telephonic Care Manager Coordinator [facility] Direct Phone: [phone] Toll Free: [phone] Fax: [phone]

## 2016-09-20 NOTE — Telephone Encounter (Signed)
Advised [facility] case manager that I cannot discuss pt  without a release.  Advised to have patient call us to discuss concerns. CM agreeable and will instruct pt to call our office.

## 2016-09-20 NOTE — Telephone Encounter (Signed)
Follow up   Caroline [name] w [facility]  Pt having some problems and she would like to speak with you about the patient condition

## 2016-09-20 NOTE — Telephone Encounter (Signed)
New message   Please call pertaining to pt   Nothing else was disclosed

## 2016-09-20 NOTE — Progress Notes (Signed)
    CC: RLE cellulitis HPI: [name] is a 71 y.o. woman with PMH noted below here for right lower extremity cellulitis   Please see Problem List/A&P for the status of the patient's chronic medical problems   Past Medical History:  Diagnosis Date  . Arthritis   . Asthma   . Bronchitis   . CHF (congestive heart failure) (HCC)   . DVT, lower extremity, recurrent (HCC)    Patient had unprovoked PE on 2002 and DVT in right lower extremety 2008.  [name] GERD (gastroesophageal reflux disease)   . History of kidney stones   . Hypertension   . PE (pulmonary embolism)    Patient had unprovoked PE on 2002  . PONV (postoperative nausea and vomiting)   . Vertigo     Review of Systems:  Constitutional: Negative for fever, chills, weight loss and malaise/fatigue.  HEENT: No headaches, vision problems, cough, hearing problems  Respiratory: Negative for cough, shortness of breath and wheezing.  Cardiovascular: Negative for chest pain, orthopnea, or PND   Gastrointestinal: Negative for heartburn, nausea, vomiting, abdominal pain, diarrhea and constipation. No hematochezia, or melena GU: negative for dysuria or hematuria. No urinary frequency or urgency Musculoskeletal: Negative for myalgias, joint pain, falls and neck pain.  Skin: Negative for rash.  Neurological: Negative for tingling. No falls.   Physical Exam: Vitals:   09/20/16 1513  BP: (!) 156/73  Pulse: 84  Temp: 98.2 F (36.8 C)  TempSrc: Oral  SpO2: 98%  Weight: 277 lb 8 oz (125.9 kg)  Height: 5\' 3"  (1.6 m)    General: A&O, in NAD, in wheelchair Neck: supple, midline trachea  CV: RRR, normal s1, s2, no m/r/g, Resp: equal and symmetric breath sounds, no wheezing or crackles  Abdomen: soft, nontender, nondistended, +BS MSK: Right leg on the posterior calf has about 6 cm cellulitis area which si nonpurulent without any drainage or fluctuance. It is warm to touch, and tender to palpation    Assessment & Plan:   See  encounters tab for problem based medical decision making. Patient seen with Dr Heide Spark

## 2016-09-20 NOTE — Patient Instructions (Signed)
Thank you for your visit today  Please finish your clindamycin as instructed  Please follow up on Monday to re-assess your symptoms  If you have worsening redness, swelling, fevers, or drainage, then please go to the emergency room, or call the clinic at [phone] if the clinic is open   Cellulitis, Adult Cellulitis is a skin infection. The infected area is usually red and sore. This condition occurs most often in the arms and lower legs. It is very important to get treated for this condition. Follow these instructions at home:  Take over-the-counter and prescription medicines only as told by your doctor.  If you were prescribed an antibiotic medicine, take it as told by your doctor. Do not stop taking the antibiotic even if you start to feel better.  Drink enough fluid to keep your pee (urine) clear or pale yellow.  Do not touch or rub the infected area.  Raise (elevate) the infected area above the level of your heart while you are sitting or lying down.  Place warm or cold wet cloths (warm or cold compresses) on the infected area. Do this as told by your doctor.  Keep all follow-up visits as told by your doctor. This is important. These visits let your doctor make sure your infection is not getting worse. Contact a doctor if:  You have a fever.  Your symptoms do not get better after 1-2 days of treatment.  Your bone or joint under the infected area starts to hurt after the skin has healed.  Your infection comes back. This can happen in the same area or another area.  You have a swollen bump in the infected area.  You have new symptoms.  You feel ill and also have muscle aches and pains. Get help right away if:  Your symptoms get worse.  You feel very sleepy.  You throw up (vomit) or have watery poop (diarrhea) for a long time.  There are red streaks coming from the infected area.  Your red area gets larger.  Your red area turns darker. This information is  not intended to replace advice given to you by your health care provider. Make sure you discuss any questions you have with your health care provider. Document Released: 06/15/2007 Document Revised: 06/04/2015 Document Reviewed: 11/05/2014 Elsevier Interactive Patient Education  2018 [employer].

## 2016-09-21 ENCOUNTER — Other Ambulatory Visit: Payer: Self-pay | Admitting: *Deleted

## 2016-09-21 DIAGNOSIS — L0390 Cellulitis, unspecified: Secondary | ICD-10-CM | POA: Insufficient documentation

## 2016-09-21 NOTE — Assessment & Plan Note (Addendum)
Patient presents for evaluation of warmth, redness and tenderness of her right leg. SHe went to the ER, on 9.8, and was given Keflex, but got hives, due to her existing penicillin allergy, and so for that reason she was then given a week course of clindamycin, which she started on Sunday. In the ER, DVT was ruled out of the leg. Today on exam, she is afebrile, BP slightly high. She says that she started clindamycin this Sunday and says has not noticed much change in her redness and swelling and feels like something is 'dripping in the inside'. But on the outside, no purulence, fluctuance, or skin breakdown seen. She says this happened after something bit her, like mosquito.  She says she noticed some diarrhea with the clindamycin,.  We explained that Given that she is in the middle of clindamycin, we would like her to finish the course. Ideally, we would have put her on levaquin, as doxy and bactrim does not have strep coverage, but we are not changing the clindamycin.  Plan -continue clindamycin -follow up on Monday to reassess symptoms -return precautions given

## 2016-09-21 NOTE — Patient Outreach (Signed)
[facility]) Care Management  09/21/2016  EUFELIA STEPPER [birth year] [id number]   CSW made an initial attempt to try and contact patient today to perform phone assessment, as well as assess and assist with social needs and services, without success.  A HIPAA compliant message was left for patient on voicemail.  CSW is currently awaiting a return call. CSW will make a second outreach attempt within the next week, if CSW does not receive a return call from patient in the meantime. Danford Bad, BSW, MSW, LCSW  Licensed Restaurant manager, [employer] [facility]  Mailing [address] [place], [place] [address] Physical Address-[address], [place], [place] [address] Toll Free Main # [phone] Fax # [phone] Cell # [phone]  Office # [phone] [email or web address]

## 2016-09-22 ENCOUNTER — Telehealth: Payer: Self-pay | Admitting: Pharmacist

## 2016-09-22 NOTE — Progress Notes (Signed)
Internal Medicine Clinic Attending  I saw and evaluated the patient.  I personally confirmed the key portions of the history and exam documented by Dr. Saraiya and I reviewed pertinent patient test results.  The assessment, diagnosis, and plan were formulated together and I agree with the documentation in the resident's note.  
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
## 2016-09-23 ENCOUNTER — Encounter: Payer: Self-pay | Admitting: Pharmacist

## 2016-09-23 ENCOUNTER — Telehealth: Payer: Self-pay | Admitting: Pharmacist

## 2016-09-23 ENCOUNTER — Ambulatory Visit: Payer: Medicare Other

## 2016-09-23 NOTE — Patient Outreach (Signed)
[facility]) Care Management  [facility] CM Pharmacy   09/23/2016  Caroline [name] 07-23-1945 [id number]  Subjective: Patient was called regarding medication management per referral from [facility] Telephonic Nurse, Wynelle Cleveland. Patient is a 71 year old female with a medical history significant for:   Hypertension, hyperlipidemia, asthma, vertigo, previous DVT, and obesity.  Patient visited the ED 09/17/16 and was treated for leg cellulitis. She was given cephalexin but had a documented allergy to penicillin She developed hives from the cephalexin and reported back to the ED on the same day.  Patient was started on Clindamycin and will complete therapy on 09/24/16.  Patient reported managing her medications on her own without difficulty.  She does not use a pill box by choice because she prefers to use medication bottles.    Patient declined pharmacy home visit and [facility] pharmacy services.  Objective:   Encounter Medications: Outpatient Encounter Prescriptions as of 09/23/2016  Medication Sig Note  . acetaminophen (TYLENOL) 500 MG tablet Take 500 mg by mouth every 6 (six) hours as needed for mild pain or moderate pain. pain   . albuterol (PROAIR HFA) 108 (90 Base) MCG/ACT inhaler INHALE 2 PUFFS INTO THE LUNGS EVERY 6 (SIX) HOURS AS NEEDED FOR WHEEZING OR SHORTNESS OF BREATH.   . budesonide-formoterol (SYMBICORT) 160-4.5 MCG/ACT inhaler Inhale 2 puffs into the lungs 2 (two) times daily.   . calcium citrate-vitamin D (CITRACAL+D) 315-200 MG-UNIT tablet Take 2 tablets by mouth daily.   . carvedilol (COREG) 12.5 MG tablet Take 1 tablet (12.5 mg total) by mouth 2 (two) times daily.   . clindamycin (CLEOCIN) 150 MG capsule Take 3 capsules (450 mg total) by mouth 3 (three) times daily. 09/23/2016: Patient will finish therapy 09/24/16  . fluticasone (FLONASE) 50 MCG/ACT nasal spray Place 2 sprays into both nostrils daily.   . furosemide (LASIX) 40 MG tablet Take 1 tablet (40 mg total) by mouth  every other day.   . hydrocortisone (PROCTOSOL HC) 2.5 % rectal cream PLACE 1 APPLICATION RECTALLY 2 TIMES DAILY.   [name] losartan (COZAAR) 100 MG tablet Take 1 tablet (100 mg total) by mouth daily.   . meclizine (ANTIVERT) 25 MG tablet Take 1 tablet (25 mg total) by mouth 3 (three) times daily as needed.   . montelukast (SINGULAIR) 10 MG tablet Take 1 tablet (10 mg total) by mouth at bedtime.   . pantoprazole (PROTONIX) 40 MG tablet TAKE 1 TABLET (40 MG TOTAL) BY MOUTH DAILY.   . pravastatin (PRAVACHOL) 80 MG tablet TAKE 1 TABLET (80 MG TOTAL) BY MOUTH DAILY.   [name] RESTASIS MULTIDOSE 0.05 % ophthalmic emulsion Use once daily   . warfarin (COUMADIN) 4 MG tablet TAKE 1 TABLET BY MOUTH DAILY--EXCEPT ON WEDNESDAYS--TAKE ONLY 1/2 TABLET ON WEDNESDAYS.   . [DISCONTINUED] metoprolol succinate (TOPROL-XL) 50 MG 24 hr tablet Take 1 tablet by mouth daily.    No facility-administered encounter medications on file as of 09/23/2016.     Functional Status: In your present state of health, do you have any difficulty performing the following activities: 09/20/2016 06/21/2016  Hearing? N N  Vision? N N  Difficulty concentrating or making decisions? N N  Walking or climbing stairs? Y Y  Comment - -  Dressing or bathing? Y N  Doing errands, shopping? Y N  Some recent data might be hidden    Fall/Depression Screening: Fall Risk  09/20/2016 06/21/2016 05/02/2016  Falls in the past year? No No No  Risk for fall due to :  Impaired balance/gait;Impaired mobility - -  Risk for fall due to: Comment - - -   PHQ 2/9 Scores 09/20/2016 09/20/2016 06/21/2016 05/02/2016 03/08/2016 12/07/2015 05/05/2015  PHQ - 2 Score 0 0 0 0 0 0 0      Assessment: Patient's medications were reviewed via telephone:   Drugs sorted by system:  Neurologic/Psychologic:  Cardiovascular: Carvedilol Furosemide Losartan  Pulmonary/Allergy: Albuterol Symbicort Fluticasone Montelukast  Gastrointestinal: Hydrocortisone Rectal  Cream Pantoprazole Pravastatin Warfarin (INR 3.5)  Pain: Acetaminophen  Vitamins/Minerals: Calcium Citrate/Vitamin D  Infectious Diseases: Clindamycin (will finish therapy 09/24/16)   Miscellaneous: Meclizine Restasis     Plan:  Route note to PCP Close pharmacy case as the patient declined pharmacy services.   Beecher Mcardle, PharmD, BCACP [facility] Clinical Pharmacist [phone]

## 2016-09-26 ENCOUNTER — Encounter: Payer: Self-pay | Admitting: Internal Medicine

## 2016-09-26 ENCOUNTER — Other Ambulatory Visit: Payer: Self-pay

## 2016-09-26 ENCOUNTER — Ambulatory Visit (INDEPENDENT_AMBULATORY_CARE_PROVIDER_SITE_OTHER): Payer: Medicare Other | Admitting: Internal Medicine

## 2016-09-26 ENCOUNTER — Ambulatory Visit (INDEPENDENT_AMBULATORY_CARE_PROVIDER_SITE_OTHER): Payer: Medicare Other | Admitting: Pharmacy Technician

## 2016-09-26 VITALS — BP 124/60 | HR 85 | Temp 99.0°F | Ht 63.0 in | Wt 274.8 lb

## 2016-09-26 DIAGNOSIS — Z0000 Encounter for general adult medical examination without abnormal findings: Secondary | ICD-10-CM

## 2016-09-26 DIAGNOSIS — Z86711 Personal history of pulmonary embolism: Secondary | ICD-10-CM

## 2016-09-26 DIAGNOSIS — I509 Heart failure, unspecified: Secondary | ICD-10-CM

## 2016-09-26 DIAGNOSIS — Z86718 Personal history of other venous thrombosis and embolism: Secondary | ICD-10-CM | POA: Diagnosis not present

## 2016-09-26 DIAGNOSIS — Z7901 Long term (current) use of anticoagulants: Secondary | ICD-10-CM | POA: Diagnosis not present

## 2016-09-26 DIAGNOSIS — I110 Hypertensive heart disease with heart failure: Secondary | ICD-10-CM | POA: Diagnosis not present

## 2016-09-26 DIAGNOSIS — Z23 Encounter for immunization: Secondary | ICD-10-CM

## 2016-09-26 DIAGNOSIS — I82409 Acute embolism and thrombosis of unspecified deep veins of unspecified lower extremity: Secondary | ICD-10-CM

## 2016-09-26 DIAGNOSIS — L03115 Cellulitis of right lower limb: Secondary | ICD-10-CM | POA: Diagnosis not present

## 2016-09-26 DIAGNOSIS — I82509 Chronic embolism and thrombosis of unspecified deep veins of unspecified lower extremity: Secondary | ICD-10-CM | POA: Diagnosis not present

## 2016-09-26 LAB — POCT INR: INR: 2.2

## 2016-09-26 MED ORDER — CLINDAMYCIN HCL 300 MG PO CAPS: 300.0000 mg | ORAL_CAPSULE | Freq: Four times a day (QID) | ORAL | 0 refills | Status: AC

## 2016-09-26 NOTE — Assessment & Plan Note (Signed)
Annual flu shot was provided today.

## 2016-09-26 NOTE — Progress Notes (Signed)
   CC: right leg pain  HPI:  [name] is a 71 y.o. with a PMH of CHF, HTN, DVT and PE on chronic anticoagulation presenting to clinic for evaluation of cellulitis.  Patient was initially prescribed Keflex by ED, but had an allergic reaction involving hives, so was switched to clindamycin. DVT was ruled out with LE dopplers. Her antibiotic treatment began 9.8.2018. Patient states that she has completed her clindamycin course and has noticed improvement but has not completely resolved. She denies worsening pain, swelling, redness, fevers, chills, trauma to area. She wears compression stockings regularly for LE swelling. Her previous DVT was in the same extremity.   Please see problem based Assessment and Plan for status of patients chronic conditions.  Past Medical History:  Diagnosis Date  . Arthritis   . Asthma   . Bronchitis   . CHF (congestive heart failure) (HCC)   . DVT, lower extremity, recurrent (HCC)    Patient had unprovoked PE on 2002 and DVT in right lower extremety 2008.  [name] GERD (gastroesophageal reflux disease)   . History of kidney stones   . Hypertension   . PE (pulmonary embolism)    Patient had unprovoked PE on 2002  . PONV (postoperative nausea and vomiting)   . Vertigo     Review of Systems:   ROS Per HPI  Physical Exam:  Vitals:   09/26/16 0904  BP: 124/60  Pulse: 85  Temp: 99 F (37.2 C)  TempSrc: Oral  SpO2: 100%  Weight: 274 lb 12.8 oz (124.6 kg)  Height: 5\' 3"  (1.6 m)   GENERAL- alert, co-operative, appears as stated age, not in any distress. HEENT- Atraumatic, normocephalic, EOMI CARDIAC- RRR, no murmurs, rubs or gallops. RESP- Moving equal volumes of air, and clear to auscultation bilaterally, no wheezes or crackles. ABDOMEN- Soft, nontender, bowel sounds present. NEURO- No obvious Cr N abnormality. EXTREMITIES- pulse 2+, symmetric; evidence of chronic vascular insufficiency in both LEs; RLE calf with increased warmth and induration  but no fluctuance or erythema. SKIN- Warm, dry, no rash or lesion. PSYCH- Normal mood and affect, appropriate thought content and speech.  Assessment & Plan:   See Encounters Tab for problem based charting.   Patient discussed with Dr. Cleda Daub   Nyra Market, MD Internal Medicine PGY2

## 2016-09-26 NOTE — Patient Instructions (Signed)
For your leg, take clindamycin 300mg  every 6 hours for 5 days. If afterwards you are not having any improvement in the pain and redness, please come back to clinic to be evaluated. If you start having fevers, chills, or worsening swelling/pain please come back sooner.

## 2016-09-26 NOTE — Progress Notes (Signed)
INTERNAL MEDICINE TEACHING ATTENDING ADDENDUM - Gust Rung, DO Duration- indefinate, Indication- recurrent VTE, INR-  Lab Results  Component Value Date   INR 2.2 09/26/2016  . Agree with pharmacy recommendations as outlined in their note.

## 2016-09-26 NOTE — Patient Outreach (Signed)
[facility]) Care Management  09/26/16  Caroline [name] Jan 31, 1945 [id number]  RNCM received referral from telephonic on 09/20/2016 for home eval/assessment of care needs and management of chronic conditions.  Patient lives alone with no significant caregiver. Depends on members in her church to assist with transportation to her medical appointments.   Patient has a past medical history of HTN, HLD, asthma, vertigo, previous DVT, obesity. She was treated in the ED on 09/17/16 for cellulitis of right leg.   Successful outreach completed with patient, however she stated that she cannot talk right now. She requested callback at another time.   Plan: RNCM to attempt to reach patient again within the next week.  Turkey R. Kana Reimann, RN, BSN, CCM [facility] Care Management Coordinator [phone]

## 2016-09-26 NOTE — Patient Instructions (Signed)
Patient instructed to take medications as defined in the Anti-coagulation Track section of this encounter.  Patient instructed to take today's dose.  Patient instructed to take 1 tablet of your blue-colored 4mg  strength warfarin tablets by mouth once-daily at 6PM all days of week--EXCEPT on Wednesday take 1/2 tablet of your 4mg  blue warfarin tablet(s).  Patient verbalized understanding of these instructions.

## 2016-09-26 NOTE — Progress Notes (Signed)
Anticoagulation Management Caroline [name] is a 71 y.o. female who reports to the clinic for monitoring of warfarin treatment.    Indication: DVT Duration: indefinite Supervising physician: Carlynn Purl  Anticoagulation Clinic Visit History: Patient does not report signs/symptoms of bleeding or thromboembolism  Other recent changes: No diet, medications, lifestyle other than is noted in patient findings. Anticoagulation Episode Summary    Current INR goal:   2.0-3.0  TTR:   87.6 % (1.6 y)  Next INR check:   10/17/2016  INR from last check:   2.2 (09/26/2016)  Weekly max warfarin dose:     Target end date:     INR check location:   [facility]  Preferred lab:     Send INR reminders to:      Indications   DVT lower extremity recurrent (HCC) [I82.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 Penicillins Anaphylaxis, Hives, Swelling and Rash    Has patient had a PCN reaction causing immediate rash, facial/tongue/throat swelling, SOB or lightheadedness with hypotension: Yes Has patient had a PCN reaction causing severe rash involving mucus membranes or skin necrosis: Yes Has patient had a PCN reaction that required hospitalization Yes Has patient had a PCN reaction occurring within the last 10 years: No If all of the above answers are "NO", then may proceed with Cephalosporin use.   . Cabbage Itching  . Shellfish Allergy Swelling  . Tomato Swelling  . Latex Itching and Rash   Prior to Admission medications   Medication Sig Start Date End Date Taking? Authorizing Provider  acetaminophen (TYLENOL) 500 MG tablet Take 500 mg by mouth every 6 (six) hours as needed for mild pain or moderate pain. pain   Yes [provider]  albuterol (PROAIR HFA) 108 (90 Base) MCG/ACT inhaler INHALE 2 PUFFS INTO THE LUNGS EVERY 6 (SIX) HOURS AS NEEDED FOR WHEEZING OR SHORTNESS OF BREATH. 03/08/16  Yes Rivet, Iris Pert, MD   budesonide-formoterol (SYMBICORT) 160-4.5 MCG/ACT inhaler Inhale 2 puffs into the lungs 2 (two) times daily. 03/08/16  Yes Rivet, Iris Pert, MD  calcium citrate-vitamin D (CITRACAL+D) 315-200 MG-UNIT tablet Take 2 tablets by mouth daily. 06/21/16  Yes Rivet, Iris Pert, MD  carvedilol (COREG) 12.5 MG tablet Take 1 tablet (12.5 mg total) by mouth 2 (two) times daily. 07/25/16 10/23/16 Yes Camnitz, Will [name], MD  fluticasone (FLONASE) 50 MCG/ACT nasal spray Place 2 sprays into both nostrils daily. 05/02/16  Yes Rivet, Iris Pert, MD  furosemide (LASIX) 40 MG tablet Take 1 tablet (40 mg total) by mouth every other day. 06/21/16  Yes Rivet, Iris Pert, MD  hydrocortisone (PROCTOSOL HC) 2.5 % rectal cream PLACE 1 APPLICATION RECTALLY 2 TIMES DAILY. 06/21/16  Yes Rivet, Iris Pert, MD  losartan (COZAAR) 100 MG tablet Take 1 tablet (100 mg total) by mouth daily. 05/02/16  Yes Rivet, Iris Pert, MD  meclizine (ANTIVERT) 25 MG tablet Take 1 tablet (25 mg total) by mouth 3 (three) times daily as needed. 11/19/15  Yes Rivet, Iris Pert, MD  montelukast (SINGULAIR) 10 MG tablet Take 1 tablet (10 mg total) by mouth at bedtime. 02/19/16  Yes Rivet, Carly J, MD  pantoprazole (PROTONIX) 40 MG tablet TAKE 1 TABLET (40 MG TOTAL) BY MOUTH DAILY. 06/20/16  Yes Rivet, Iris Pert, MD  pravastatin (PRAVACHOL) 80 MG tablet TAKE 1 TABLET (80 MG TOTAL) BY MOUTH DAILY. 05/11/16  Yes Rivet, Iris Pert, MD  RESTASIS MULTIDOSE 0.05 % ophthalmic emulsion Use once daily 08/19/16  Yes [provider]  warfarin (COUMADIN) 4 MG tablet TAKE 1 TABLET BY MOUTH DAILY--EXCEPT ON WEDNESDAYS--TAKE ONLY 1/2 TABLET ON WEDNESDAYS. 09/06/16  Yes Molt, Bethany, DO  clindamycin (CLEOCIN) 300 MG capsule Take 1 capsule (300 mg total) by mouth 4 (four) times daily. 09/26/16 10/01/16  Nyra Market, MD   Past Medical History:  Diagnosis Date  . Arthritis   . Asthma   . Bronchitis   . CHF (congestive heart failure) (HCC)   . DVT, lower extremity, recurrent (HCC)    Patient  had unprovoked PE on 2002 and DVT in right lower extremety 2008.  [name] GERD (gastroesophageal reflux disease)   . History of kidney stones   . Hypertension   . PE (pulmonary embolism)    Patient had unprovoked PE on 2002  . PONV (postoperative nausea and vomiting)   . Vertigo    Social History   Social History  . Marital status: Divorced    Spouse name: N/A  . Number of children: N/A  . Years of education: N/A   Social History Main Topics  . Smoking status: Former Smoker    Types: Cigarettes    Quit date: 09/18/1988  . Smokeless tobacco: Never Used  . Alcohol use No  . Drug use: No  . Sexual activity: No   Other Topics Concern  . Not on file   Social History Narrative  . No narrative on file   Family History  Problem Relation Age of Onset  . Cancer Mother   . Hypertension Sister   . Hypertension Brother   . Diabetes Brother   . Hypertension Sister   . Colon cancer Other   . Heart Problems Other 34       sister's child, open heart surgery    ASSESSMENT Recent Results: The most recent result is correlated with 22 mg per week: Lab Results  Component Value Date   INR 2.2 09/26/2016   INR 3.5 09/19/2016   INR 3.09 09/17/2016    Anticoagulation Dosing: INR as of 09/26/2016 and Previous Warfarin Dosing Information    INR Dt INR Goal [facility] Sun Mon Tue Wed Thu Fri Sat   09/26/2016 2.2 2.0-3.0 22 mg 4 mg 2 mg 4 mg 2 mg 4 mg 2 mg 4 mg    Previous description   Take 1 tablet of your blue-colored 4mg  strength warfarin tablets by mouth once-daily at 6PM all days of week--EXCEPT on Mondays, Wednesdays and Fridays, take 1/2 tablet of your 4mg  blue warfarin tablet(s).    Anticoagulation Warfarin Dose Instructions as of 09/26/2016      Total Sun Mon Tue Wed Thu Fri Sat   New Dose 26 mg 4 mg 4 mg 4 mg 2 mg 4 mg 4 mg 4 mg     (4 mg x 1)  (4 mg x 1)  (4 mg x 1)  (4 mg x 0.5)  (4 mg x 1)  (4 mg x 1)  (4 mg x 1)                         Description   Take 1 tablet of  your blue-colored 4mg  strength warfarin tablets by mouth once-daily at 6PM all days of week--EXCEPT on Wednesday take 1/2 tablet of your 4mg  blue warfarin tablet(s).      INR today: Therapeutic  PLAN Weekly dose  was increased by 15% to 26 mg per week  Patient Instructions  Patient instructed to take medications as defined in the Anti-coagulation Track section of this encounter.  Patient instructed to take today's dose.  Patient instructed to take 1 tablet of your blue-colored  strength warfarin tablets by mouth once-daily at 6PM all days of week--EXCEPT on Wednesday take 1/2 tablet of your  blue warfarin tablet(s).  Patient verbalized understanding of these instructions.      Patient advised to contact clinic or seek medical attention if signs/symptoms of bleeding or thromboembolism occur.  Patient verbalized understanding by repeating back information and was advised to contact me if further medication-related questions arise. Patient was also provided an information handout.  Follow-up Return in 3 weeks (on 10/17/2016) for Follow-up INR at 0900.  Daylene Posey, PharmD Pharmacy Resident Pager #: [phone] 09/26/2016 9:37 AM  15 minutes spent face-to-face with the patient during the encounter. 50% of time spent on education. 50% of time was spent on INR sample collection, processing, results interpretation, dose adjustment and documentation in [email or web address].

## 2016-09-26 NOTE — Assessment & Plan Note (Signed)
Patient with improving cellulitis of right LE though still with evidence of induration and inc warmth that is concerning for unresolved infection. Otherwise patient is without signs/symptoms of worsening infection, abscess or DVT. Patient's previous DVT was in same leg which may be contributing to symptoms from chronic venous insufficiency.   Plan: --clindamycin 300mg  qid x5 days --advised on continued use of compression stockings and possibility of chronic venous insufficiency contributing to her symptoms --advised to return to clinic if symptoms to do not resolve or worsen

## 2016-09-27 ENCOUNTER — Ambulatory Visit: Payer: Self-pay

## 2016-09-27 NOTE — Progress Notes (Signed)
Internal Medicine Clinic Attending  Case discussed with Dr. Svalina  at the time of the visit.  We reviewed the resident's history and exam and pertinent patient test results.  I agree with the assessment, diagnosis, and plan of care documented in the resident's note.  

## 2016-09-28 ENCOUNTER — Encounter: Payer: Self-pay | Admitting: *Deleted

## 2016-09-28 ENCOUNTER — Other Ambulatory Visit: Payer: Self-pay | Admitting: *Deleted

## 2016-09-28 NOTE — Patient Outreach (Signed)
Caroline [name] [facility] [place]) Care Management  09/28/2016  Caroline [name] 12-03-1945 [id number]   CSW was able to make initial contact with patient today to perform phone assessment, as well as assess and assist with social work needs and services.  CSW introduced self, explained role and types of services provided through [facility] ([facility]).  CSW further explained to patient that CSW works with patient's Telephonic RNCM, also with [facility], Juanita Futrell. CSW then explained the reason for the call, indicating that [name] thought that patient would benefit from social work services and resources to assist with making referrals to various community agencies and resources, as well as obtaining a home care support system.  CSW obtained two HIPAA compliant identifiers from patient, which included patient's name and date of birth. Patient admitted that she has a very good support system through her daughter's, Matasha Smigelski and Lewie Loron, as well as through a close friend, Candis Schatz.  Patient feels confident in being able to call upon anyone of them, at any time.  Patient indicated that she is not quite ready for advanced care planning services, such as completing her Advanced Directives ([place] documents).  Patient inquired about taking down CSW's contact information and contacting CSW directly, at a later time, if and when she is ready to discuss further.  Patient denied being able to identify any additional social work needs at present. CSW will perform a case closure on patient, as all goals of treatment have been met from social work standpoint and no additional social work needs have been identified at this time.  CSW will notify patient's Telephonic RNCM with [facility], [place] Bells of CSW's plans to close patient's case.  CSW will fax an update to patient's Primary  Care Physician, Dr. Romelle Starcher Molt to ensure that they are aware of CSW's involvement with patient's plan of care.  CSW will submit a case closure request to Verlon Setting, Care Management Assistant with [facility], in the form of an In [employer].  CSW will ensure that [name] is aware of [name], RNCM with [facility], continued involvement with patient's care. Nat Christen, BSW, MSW, LCSW  Licensed Education officer, [facility]  Mailing [place] [address], [place], [place] [address] Physical Address-[address], [place], [place] [address] Toll Free Main # [phone] Fax # [phone] Cell # [phone]  Office # [phone] [email or web address]

## 2016-09-28 NOTE — Patient Outreach (Signed)
Request received from Joanna Saporito, LCSW to mail patient personal care resources.  Information mailed today. 

## 2016-10-03 ENCOUNTER — Ambulatory Visit (INDEPENDENT_AMBULATORY_CARE_PROVIDER_SITE_OTHER): Payer: Medicare Other | Admitting: Pharmacist

## 2016-10-03 DIAGNOSIS — I82409 Acute embolism and thrombosis of unspecified deep veins of unspecified lower extremity: Secondary | ICD-10-CM | POA: Diagnosis not present

## 2016-10-03 DIAGNOSIS — Z86718 Personal history of other venous thrombosis and embolism: Secondary | ICD-10-CM | POA: Diagnosis not present

## 2016-10-03 DIAGNOSIS — Z7901 Long term (current) use of anticoagulants: Secondary | ICD-10-CM | POA: Diagnosis not present

## 2016-10-03 LAB — POCT INR: INR: 3.3

## 2016-10-03 NOTE — Progress Notes (Signed)
Anticoagulation Management Caroline [name] is a 71 y.o. female who reports to the clinic for monitoring of warfarin treatment.    Indication: DVT. Lower extremity, recurrent (HCC) [182.409], Pulmonary embolism, history of (resolved) [Z86.718]; on long term anticoagulation for prevention of recurrent VTE.   Duration: indefinite Supervising physician: Earl Lagos  Anticoagulation Clinic Visit History: Patient does not report signs/symptoms of bleeding or thromboembolism  Other recent changes: No diet, medications, lifestyle changes other than as noted in patient findings.  Anticoagulation Episode Summary    Current INR goal:   2.0-3.0  TTR:   87.4 % (1.7 y)  Next INR check:   10/31/2016  INR from last check:   3.30! (10/03/2016)  Weekly max warfarin dose:     Target end date:     INR check [facility]   [facility]  Preferred lab:     Send INR reminders to:      Indications   DVT lower extremity recurrent (HCC) [I82.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 Penicillins Anaphylaxis, Hives, Swelling and Rash    Has patient had a PCN reaction causing immediate rash, facial/tongue/throat swelling, SOB or lightheadedness with hypotension: Yes Has patient had a PCN reaction causing severe rash involving mucus membranes or skin necrosis: Yes Has patient had a PCN reaction that required hospitalization Yes Has patient had a PCN reaction occurring within the last 10 years: No If all of the above answers are "NO", then may proceed with Cephalosporin use.   . Cabbage Itching  . Keflex [Cephalexin] Hives    And feeling of throat tightness  . Shellfish Allergy Swelling  . Tomato Swelling  . Latex Itching and Rash   Prior to Admission medications   Medication Sig Start Date End Date Taking? Authorizing Provider  acetaminophen (TYLENOL) 500 MG tablet Take 500 mg by mouth every 6 (six) hours as needed for mild  pain or moderate pain. pain   Yes [provider]  albuterol (PROAIR HFA) 108 (90 Base) MCG/ACT inhaler INHALE 2 PUFFS INTO THE LUNGS EVERY 6 (SIX) HOURS AS NEEDED FOR WHEEZING OR SHORTNESS OF BREATH. 03/08/16  Yes Rivet, Iris Pert, MD  budesonide-formoterol (SYMBICORT) 160-4.5 MCG/ACT inhaler Inhale 2 puffs into the lungs 2 (two) times daily. 03/08/16  Yes Rivet, Iris Pert, MD  calcium citrate-vitamin D (CITRACAL+D) 315-200 MG-UNIT tablet Take 2 tablets by mouth daily. 06/21/16  Yes Rivet, Iris Pert, MD  carvedilol (COREG) 12.5 MG tablet Take 1 tablet (12.5 mg total) by mouth 2 (two) times daily. 07/25/16 10/23/16 Yes Camnitz, Will [name], MD  fluticasone (FLONASE) 50 MCG/ACT nasal spray Place 2 sprays into both nostrils daily. 05/02/16  Yes Rivet, Iris Pert, MD  furosemide (LASIX) 40 MG tablet Take 1 tablet (40 mg total) by mouth every other day. 06/21/16  Yes Rivet, Iris Pert, MD  hydrocortisone (PROCTOSOL HC) 2.5 % rectal cream PLACE 1 APPLICATION RECTALLY 2 TIMES DAILY. 06/21/16  Yes Rivet, Iris Pert, MD  losartan (COZAAR) 100 MG tablet Take 1 tablet (100 mg total) by mouth daily. 05/02/16  Yes Rivet, Iris Pert, MD  meclizine (ANTIVERT) 25 MG tablet Take 1 tablet (25 mg total) by mouth 3 (three) times daily as needed. 11/19/15  Yes Rivet, Iris Pert, MD  montelukast (SINGULAIR) 10 MG tablet Take 1 tablet (10 mg total) by mouth at bedtime. 02/19/16  Yes Rivet, Iris Pert, MD  pantoprazole (PROTONIX) 40 MG tablet TAKE 1 TABLET (40 MG TOTAL) BY MOUTH DAILY. 06/20/16  Yes Rivet, Iris Pert, MD  pravastatin (PRAVACHOL) 80 MG tablet TAKE 1 TABLET (80 MG TOTAL) BY MOUTH DAILY. 05/11/16  Yes Rivet, Iris Pert, MD  RESTASIS MULTIDOSE 0.05 % ophthalmic emulsion Use once daily 08/19/16  Yes [provider]  warfarin (COUMADIN) 4 MG tablet TAKE 1 TABLET BY MOUTH DAILY--EXCEPT ON WEDNESDAYS--TAKE ONLY 1/2 TABLET ON WEDNESDAYS. 09/06/16  Yes Molt, Bethany, DO   Past Medical History:  Diagnosis Date  . Arthritis   . Asthma   .  Bronchitis   . CHF (congestive heart failure) (HCC)   . DVT, lower extremity, recurrent (HCC)    Patient had unprovoked PE on 2002 and DVT in right lower extremety 2008.  [name] GERD (gastroesophageal reflux disease)   . History of kidney stones   . Hypertension   . PE (pulmonary embolism)    Patient had unprovoked PE on 2002  . PONV (postoperative nausea and vomiting)   . Vertigo    Social History   Social History  . Marital status: Divorced    Spouse name: N/A  . Number of children: N/A  . Years of education: N/A   Social History Main Topics  . Smoking status: Former Smoker    Types: Cigarettes    Quit date: 09/18/1988  . Smokeless tobacco: Never Used  . Alcohol use No  . Drug use: No  . Sexual activity: No   Other Topics Concern  . Not on file   Social History Narrative  . No narrative on file   Family History  Problem Relation Age of Onset  . Cancer Mother   . Hypertension Sister   . Hypertension Brother   . Diabetes Brother   . Hypertension Sister   . Colon cancer Other   . Heart Problems Other 34       sister's child, open heart surgery    ASSESSMENT Recent Results: The most recent result is correlated with 26 mg per week: Lab Results  Component Value Date   INR 3.30 10/03/2016   INR 2.2 09/26/2016   INR 3.5 09/19/2016    Anticoagulation Dosing: INR as of 10/03/2016 and Previous Warfarin Dosing Information    INR Dt INR Goal Wkly Tot Sun Mon Tue Wed Thu Fri Sat   10/03/2016 3.30 2.0-3.0 26 mg 4 mg 4 mg 4 mg 2 mg 4 mg 4 mg 4 mg    Previous description   Take 1 tablet of your blue-colored  strength warfarin tablets by mouth once-daily at 6PM all days of week--EXCEPT on Wednesday take 1/2 tablet of your  blue warfarin tablet(s).    Anticoagulation Warfarin Dose Instructions as of 10/03/2016      Total Sun Mon Tue Wed Thu Fri Sat   New Dose 24 mg 4 mg 2 mg 4 mg 4 mg 2 mg 4 mg 4 mg     (4 mg x 1)  (4 mg x 0.5)  (4 mg x 1)  (4 mg x 1)  (4 mg x  0.5)  (4 mg x 1)  (4 mg x 1)                         Description   Take 1 tablet of your blue-colored  strength warfarin tablets by mouth once-daily at 6PM all days of week--EXCEPT on Mondays and Thursdays,  take 1/2 tablet of your  blue  warfarin tablet(s).      INR today: Supratherapeutic  PLAN Weekly dose was decreased from  per week to  per week.   Patient Instructions  Patient instructed to take medications as defined in the Anti-coagulation Track section of this encounter.  Patient instructed to take today's dose.  Patient instructed to take 1 tablet of your blue-colored  strength warfarin tablets by mouth once-daily at 6PM all days of week--EXCEPT on Mondays and Thursdays,  take 1/2 tablet of your  blue warfarin tablet(s). Patient verbalized understanding of these instructions.     Patient advised to contact clinic or seek medical attention if signs/symptoms of bleeding or thromboembolism occur.  Patient verbalized understanding by repeating back information and was advised to contact me if further medication-related questions arise. Patient was also provided an information handout.  Follow-up Return in 4 weeks (on 10/31/2016) for Follow up INR at 0915h.  Elicia Lamp, PharmD, CACP, CPP  15 minutes spent face-to-face with the patient during the encounter. 50% of time spent on education. 50% of time was spent on point of care fingerstick INR sample collection, processing, results determination and dose adjustment, documentation in EPIC/CHL and [email or web address].

## 2016-10-03 NOTE — Progress Notes (Signed)
INTERNAL MEDICINE TEACHING ATTENDING ADDENDUM - Nicolet Griffy M.D  Duration- indefinite, Indication- PE, DVT, INR- supra therapeutic. Agree with pharmacy recommendations as outlined in their note.      

## 2016-10-03 NOTE — Patient Instructions (Signed)
Patient instructed to take medications as defined in the Anti-coagulation Track section of this encounter.  Patient instructed to take today's dose.  Patient instructed to take 1 tablet of your blue-colored 4mg  strength warfarin tablets by mouth once-daily at 6PM all days of week--EXCEPT on Mondays and Thursdays,  take 1/2 tablet of your 4mg  blue warfarin tablet(s). Patient verbalized understanding of these instructions.

## 2016-10-08 ENCOUNTER — Other Ambulatory Visit: Payer: Self-pay | Admitting: Internal Medicine

## 2016-10-08 DIAGNOSIS — L03115 Cellulitis of right lower limb: Secondary | ICD-10-CM

## 2016-10-10 ENCOUNTER — Other Ambulatory Visit: Payer: Self-pay | Admitting: Internal Medicine

## 2016-10-10 NOTE — Telephone Encounter (Signed)
Patient needing request on med. Pls call

## 2016-10-10 NOTE — Telephone Encounter (Signed)
Call made to patient-she is requesting a refill on clindamycin.  Pt previously seen in the office 09/17 for cellulitis.  She has completed 2 courses of clindamycin and states her leg is "a lot better", but still a little red, aches and throbs.  Will have pt seen in [facility] to assess if another rx is warranted. Pt instructed to call back should her conditions worsens.Kingsley Spittle Cassady10/1/201811:51 AM

## 2016-10-11 ENCOUNTER — Telehealth: Payer: Self-pay | Admitting: *Deleted

## 2016-10-11 ENCOUNTER — Encounter: Payer: Self-pay | Admitting: Internal Medicine

## 2016-10-11 ENCOUNTER — Ambulatory Visit (INDEPENDENT_AMBULATORY_CARE_PROVIDER_SITE_OTHER): Payer: Medicare Other | Admitting: Internal Medicine

## 2016-10-11 DIAGNOSIS — L03115 Cellulitis of right lower limb: Secondary | ICD-10-CM

## 2016-10-11 DIAGNOSIS — M793 Panniculitis, unspecified: Secondary | ICD-10-CM

## 2016-10-11 DIAGNOSIS — Z86711 Personal history of pulmonary embolism: Secondary | ICD-10-CM | POA: Diagnosis not present

## 2016-10-11 DIAGNOSIS — I110 Hypertensive heart disease with heart failure: Secondary | ICD-10-CM

## 2016-10-11 DIAGNOSIS — I509 Heart failure, unspecified: Secondary | ICD-10-CM

## 2016-10-11 DIAGNOSIS — I8310 Varicose veins of unspecified lower extremity with inflammation: Secondary | ICD-10-CM

## 2016-10-11 HISTORY — DX: Panniculitis, unspecified: M79.3

## 2016-10-11 MED ORDER — CLOBETASOL PROP EMOLLIENT BASE 0.05 % EX CREA: 1.0000 "application " | TOPICAL_CREAM | Freq: Two times a day (BID) | CUTANEOUS | 0 refills | Status: DC

## 2016-10-11 NOTE — Assessment & Plan Note (Signed)
Patient has a history of recurrent cellulitis of her right lower extremity now s/p 2 courses of clindamycin (penicillin allergy). She is presenting today due to concern for recurrent infection. On exam she has a very large area of induration and discoloration of her right posterior calf (see pictures under media tab or progress note from today). There is no erythema, warmth, tenderness to palpation, or signs concerning for infection. She is afebrile here and denies fevers at home. Unfortunately it appears that she has now developed lipodermatosclerosis in response to her prior episodes of cellulitis and likely underlying venous insufficiency. Prescribed clobetasol cream BID x 2 weeks and referred patient to dermatology for possible intralesional steroid injections if appropriate. Discussed with patient that unfortunately once scar tissue has formed it becomes difficult to treat. Advised patient to continue with her compression stockings and daily lasix. Elevate legs while resting.  -- Clobetasol cream BID x 2 weeks -- Dermatology referral for possible intralesional injections  -- Continue compression stockings, daily lasix  -- Follow up as needed

## 2016-10-11 NOTE — Patient Instructions (Signed)
Ms. Voorheis,  It was a pleasure to see you today. Your leg infection has healed unfortunately you have developed scar tissue in the area that is inflamed. The medical term for this is called lipodermatosclerosis. I have sent a prescription for a steroid cream to your pharmacy. Please apply this twice a day for 2 weeks to the area. I have also referred you to a dermatologist. They will contact you for an appointment. If you have any questions or concerns, call our clinic at [phone] or after hours call [phone] and ask for the internal medicine resident on call. Thank you!  - Dr. Antony Contras

## 2016-10-11 NOTE — Progress Notes (Signed)
Internal Medicine Clinic Attending  I saw and evaluated the patient.  I personally confirmed the key portions of the history and exam documented by Dr. Guilloud and I reviewed pertinent patient test results.  The assessment, diagnosis, and plan were formulated together and I agree with the documentation in the resident's note.  

## 2016-10-11 NOTE — Progress Notes (Signed)
   CC: LE cellulitis follow up  HPI:  CarolineCaroline [name] is a 71 y.o. female with past medical history outlined below here for follow up of her lower extremity cellulitis. For the details of today's visit, please refer to the assessment and plan.  Past Medical History:  Diagnosis Date  . Arthritis   . Asthma   . Bronchitis   . CHF (congestive heart failure) (HCC)   . DVT, lower extremity, recurrent (HCC)    Patient had unprovoked PE on 2002 and DVT in right lower extremety 2008.  [name] GERD (gastroesophageal reflux disease)   . History of kidney stones   . Hypertension   . PE (pulmonary embolism)    Patient had unprovoked PE on 2002  . PONV (postoperative nausea and vomiting)   . Vertigo     Review of Systems  Constitutional: Negative for chills and fever.  Cardiovascular: Positive for leg swelling.    Physical Exam:  Vitals:   10/11/16 0948  BP: 139/66  Pulse: 78  Temp: 98 F (36.7 C)  TempSrc: Oral  SpO2: 97%  Weight: 279 lb 11.2 oz (126.9 kg)  Height: 5\' 3"  (1.6 m)    Constitutional: NAD, appears comfortable Cardiovascular: RRR Pulmonary/Chest: CTAB Extremities: Warm and well perfused. Bilateral non pitting edema, right posterior lower extremity with large area induration and discoloration (see pictures below). No erythema or tenderness to palpation.  Psychiatric: Normal mood and affect     Assessment & Plan:   See Encounters Tab for problem based charting.  Patient seen with Dr. Rogelia Boga

## 2016-10-11 NOTE — Telephone Encounter (Signed)
CALLED PATIENT LEFT VOICE MESSAGE FOR PATIENT TO RETURN CALL REGARDING DERMATOLOGY REFERRAL. [phone]

## 2016-10-17 ENCOUNTER — Ambulatory Visit: Payer: Medicare Other

## 2016-10-19 DIAGNOSIS — I8310 Varicose veins of unspecified lower extremity with inflammation: Secondary | ICD-10-CM | POA: Diagnosis not present

## 2016-10-25 ENCOUNTER — Other Ambulatory Visit: Payer: Self-pay | Admitting: *Deleted

## 2016-10-25 DIAGNOSIS — I10 Essential (primary) hypertension: Secondary | ICD-10-CM

## 2016-10-25 DIAGNOSIS — I82409 Acute embolism and thrombosis of unspecified deep veins of unspecified lower extremity: Secondary | ICD-10-CM

## 2016-10-25 MED ORDER — PRAVASTATIN SODIUM 80 MG PO TABS: ORAL_TABLET | ORAL | 1 refills | Status: DC

## 2016-10-25 MED ORDER — LOSARTAN POTASSIUM 100 MG PO TABS: 100.0000 mg | ORAL_TABLET | Freq: Every day | ORAL | 1 refills | Status: DC

## 2016-10-28 ENCOUNTER — Other Ambulatory Visit: Payer: Self-pay

## 2016-10-28 NOTE — Patient Outreach (Signed)
[facility]) Care Management  10/28/16  Caroline [name] 08/03/1945 [id number]  Attempted to reach patient without success. Left HIPAA compliant voicemail with RNCM contact information and invited patient to return call.  Turkey R. Chanetta Moosman, RN, BSN, CCM [facility] Care Management Coordinator [phone]

## 2016-10-29 ENCOUNTER — Other Ambulatory Visit: Payer: Self-pay | Admitting: Internal Medicine

## 2016-10-29 DIAGNOSIS — I8310 Varicose veins of unspecified lower extremity with inflammation: Secondary | ICD-10-CM

## 2016-10-31 ENCOUNTER — Ambulatory Visit (INDEPENDENT_AMBULATORY_CARE_PROVIDER_SITE_OTHER): Payer: Medicare Other | Admitting: Pharmacist

## 2016-10-31 ENCOUNTER — Encounter (INDEPENDENT_AMBULATORY_CARE_PROVIDER_SITE_OTHER): Payer: Self-pay

## 2016-10-31 DIAGNOSIS — I82409 Acute embolism and thrombosis of unspecified deep veins of unspecified lower extremity: Secondary | ICD-10-CM

## 2016-10-31 DIAGNOSIS — Z7901 Long term (current) use of anticoagulants: Secondary | ICD-10-CM

## 2016-10-31 LAB — POCT INR: INR: 2.5

## 2016-10-31 NOTE — Progress Notes (Signed)
Anticoagulation Management Caroline [name] is a 71 y.o. female who reports to the clinic for monitoring of warfarin treatment.    Indication: DVT, lower extremity, recurrent (HCC) [182.409]; Pulmonary Embolism, History of (Resolved) [86.718]; long term (current) use of anticoagulants.  Duration: indefinite Supervising physician: [name]  Anticoagulation Clinic Visit History: Patient does not report signs/symptoms of bleeding or thromboembolism  Other recent changes: DOES report diet, medications, lifestyle changes (see Patient findings:  New medication; clindamycin 300mg  BID x 14 days per dermatology (Dr. Margo Aye). Starts today.  Anticoagulation Episode Summary    Current INR goal:   2.0-3.0  TTR:   86.3 % (1.7 y)  Next INR check:   11/07/2016  INR from last check:   2.5 (10/31/2016)  Weekly max warfarin dose:     Target end date:     INR check location:   [facility]  Preferred lab:     Send INR reminders to:      Indications   DVT lower extremity recurrent (HCC) [I82.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 Penicillins Anaphylaxis, Hives, Swelling and Rash    Has patient had a PCN reaction causing immediate rash, facial/tongue/throat swelling, SOB or lightheadedness with hypotension: Yes Has patient had a PCN reaction causing severe rash involving mucus membranes or skin necrosis: Yes Has patient had a PCN reaction that required hospitalization Yes Has patient had a PCN reaction occurring within the last 10 years: No If all of the above answers are "NO", then may proceed with Cephalosporin use.   . Cabbage Itching  . Keflex [Cephalexin] Hives    And feeling of throat tightness  . Shellfish Allergy Swelling  . Tomato Swelling  . Latex Itching and Rash   Prior to Admission medications   Medication Sig Start Date End Date Taking? Authorizing Provider  acetaminophen (TYLENOL) 500 MG tablet  Take 500 mg by mouth every 6 (six) hours as needed for mild pain or moderate pain. pain   Yes [provider]  albuterol (PROAIR HFA) 108 (90 Base) MCG/ACT inhaler INHALE 2 PUFFS INTO THE LUNGS EVERY 6 (SIX) HOURS AS NEEDED FOR WHEEZING OR SHORTNESS OF BREATH. 03/08/16  Yes Rivet, Iris Pert, MD  budesonide-formoterol (SYMBICORT) 160-4.5 MCG/ACT inhaler Inhale 2 puffs into the lungs 2 (two) times daily. 03/08/16  Yes Rivet, Iris Pert, MD  calcium citrate-vitamin D (CITRACAL+D) 315-200 MG-UNIT tablet Take 2 tablets by mouth daily. 06/21/16  Yes Rivet, Iris Pert, MD  fluticasone (FLONASE) 50 MCG/ACT nasal spray Place 2 sprays into both nostrils daily. 05/02/16  Yes Rivet, Iris Pert, MD  furosemide (LASIX) 40 MG tablet Take 1 tablet (40 mg total) by mouth every other day. 06/21/16  Yes Rivet, Iris Pert, MD  hydrocortisone (PROCTOSOL HC) 2.5 % rectal cream PLACE 1 APPLICATION RECTALLY 2 TIMES DAILY. 06/21/16  Yes Rivet, Iris Pert, MD  losartan (COZAAR) 100 MG tablet Take 1 tablet (100 mg total) by mouth daily. 10/25/16  Yes Molt, Bethany, DO  meclizine (ANTIVERT) 25 MG tablet Take 1 tablet (25 mg total) by mouth 3 (three) times daily as needed. 11/19/15  Yes Rivet, Iris Pert, MD  montelukast (SINGULAIR) 10 MG tablet Take 1 tablet (10 mg total) by mouth at bedtime. 02/19/16  Yes Rivet, Carly J, MD  pantoprazole (PROTONIX) 40 MG tablet TAKE 1 TABLET (40 MG TOTAL) BY MOUTH DAILY. 06/20/16  Yes Rivet,  Iris Pertarly J, MD  pravastatin (PRAVACHOL) 80 MG tablet TAKE 1 TABLET (80 MG TOTAL) BY MOUTH DAILY. 10/25/16  Yes Molt, Bethany, DO  RESTASIS MULTIDOSE 0.05 % ophthalmic emulsion Use once daily 08/19/16  Yes [provider]  warfarin (COUMADIN) 4 MG tablet TAKE 1 TABLET BY MOUTH DAILY--EXCEPT ON WEDNESDAYS--TAKE ONLY 1/2 TABLET ON WEDNESDAYS. 09/06/16  Yes Molt, Bethany, DO  carvedilol (COREG) 12.5 MG tablet Take 1 tablet (12.5 mg total) by mouth 2 (two) times daily. 07/25/16 10/23/16  Regan Lemmingamnitz, Will Martin, MD   Past Medical  History:  Diagnosis Date  . Arthritis   . Asthma   . Bronchitis   . CHF (congestive heart failure) (HCC)   . DVT, lower extremity, recurrent (HCC)    Patient had unprovoked PE on 2002 and DVT in right lower extremety 2008.  [name]. GERD (gastroesophageal reflux disease)   . History of kidney stones   . Hypertension   . PE (pulmonary embolism)    Patient had unprovoked PE on 2002  . PONV (postoperative nausea and vomiting)   . Vertigo    Social History   Social History  . Marital status: Divorced    Spouse name: N/A  . Number of children: N/A  . Years of education: N/A   Social History Main Topics  . Smoking status: Former Smoker    Types: Cigarettes    Quit date: 09/18/1988  . Smokeless tobacco: Never Used  . Alcohol use No  . Drug use: No  . Sexual activity: No   Other Topics Concern  . Not on file   Social History Narrative  . No narrative on file   Family History  Problem Relation Age of Onset  . Cancer Mother   . Hypertension Sister   . Hypertension Brother   . Diabetes Brother   . Hypertension Sister   . Colon cancer Other   . Heart Problems Other 34       sister's child, open heart surgery    ASSESSMENT Recent Results: The most recent result is correlated with 24 mg per week: Lab Results  Component Value Date   INR 2.5 10/31/2016   INR 3.30 10/03/2016   INR 2.2 09/26/2016    Anticoagulation Dosing: INR as of 10/31/2016 and Previous Warfarin Dosing Information    INR Dt INR Goal [facility] Tot Sun Mon Tue Wed Thu Fri Sat   10/31/2016 2.5 2.0-3.0 24 mg 4 mg 2 mg 4 mg 4 mg 2 mg 4 mg 4 mg    Previous description   Take 1 tablet of your blue-colored 4mg  strength warfarin tablets by mouth once-daily at 6PM all days of week--EXCEPT on Mondays and Thursdays,  take 1/2 tablet of your 4mg  blue warfarin tablet(s).    Anticoagulation Warfarin Dose Instructions as of 10/31/2016      Total Sun Mon Tue Wed Thu Fri Sat   New Dose 22 mg 4 mg 2 mg 4 mg 2 mg 4 mg 2 mg 4 mg      (4 mg x 1)  (4 mg x 0.5)  (4 mg x 1)  (4 mg x 0.5)  (4 mg x 1)  (4 mg x 0.5)  (4 mg x 1)                         Description   Take 1 tablet of your blue-colored 4mg  strength warfarin tablets by mouth once-daily at 6PM all days of week--EXCEPT on Mondays, Wednesdays  and Fridays-- take 1/2 tablet of your 4mg  blue warfarin tablet(s) on Mondays, Wednesdays and Fridays.      INR today: Therapeutic  PLAN Weekly dose was decreased by 8% to 22 mg per week since she is starting upon Clindamycin 300mg  PO BID x 14 days commencing today. Discussed with the patient if she were to commence having diarrhea--to call clinic ASAP to discuss and evaluate its significance.   Patient Instructions  Patient instructed to take medications as defined in the Anti-coagulation Track section of this encounter.  Patient instructed to take  today's dose.  Patient instructed to take 1 tablet of your blue-colored 4mg  strength warfarin tablets by mouth once-daily at 6PM all days of week--EXCEPT on Mondays, Wednesdays and Fridays-- take 1/2 tablet of your 4mg  blue warfarin tablet(s) on Mondays, Wednesdays and Fridays. Patient instructed that if diarrhea were to occur after starting antibiotics today--to call clinic.  Patient verbalized understanding of these instructions.     Patient advised to contact clinic or seek medical attention if signs/symptoms of bleeding or thromboembolism occur.  Patient verbalized understanding by repeating back information and was advised to contact me if further medication-related questions arise. Patient was also provided an information handout.  Follow-up Return in about 7 days (around 11/07/2016) for Follow up INR at 0930h.  Elicia Lamp, PharmD, CACP, CPP  15 minutes spent face-to-face with the patient during the encounter. 50% of time spent on education. 50% of time was spent on fingerstick point of care INR sample collection, processing, results determination, dose  adjustment and documentation in EPIC/CHL and [email or web address].

## 2016-10-31 NOTE — Patient Instructions (Signed)
Patient instructed to take medications as defined in the Anti-coagulation Track section of this encounter.  Patient instructed to take  today's dose.  Patient instructed to take 1 tablet of your blue-colored 4mg  strength warfarin tablets by mouth once-daily at 6PM all days of week--EXCEPT on Mondays, Wednesdays and Fridays-- take 1/2 tablet of your 4mg  blue warfarin tablet(s) on Mondays, Wednesdays and Fridays. Patient instructed that if diarrhea were to occur after starting antibiotics today--to call clinic.  Patient verbalized understanding of these instructions.

## 2016-11-07 ENCOUNTER — Ambulatory Visit (INDEPENDENT_AMBULATORY_CARE_PROVIDER_SITE_OTHER): Payer: Medicare Other | Admitting: Pharmacist

## 2016-11-07 ENCOUNTER — Encounter (INDEPENDENT_AMBULATORY_CARE_PROVIDER_SITE_OTHER): Payer: Self-pay

## 2016-11-07 DIAGNOSIS — Z7901 Long term (current) use of anticoagulants: Secondary | ICD-10-CM | POA: Diagnosis not present

## 2016-11-07 DIAGNOSIS — I82409 Acute embolism and thrombosis of unspecified deep veins of unspecified lower extremity: Secondary | ICD-10-CM

## 2016-11-07 LAB — POCT INR: INR: 2.5

## 2016-11-07 NOTE — Progress Notes (Signed)
Anticoagulation Management Caroline [name] is a 71 y.o. female who reports to the clinic for monitoring of warfarin treatment.    Indication: DVT  Duration: indefinite Supervising physician: [name]  Anticoagulation Clinic Visit History: Patient does not report signs/symptoms of bleeding or thromboembolism  Other recent changes: No diet, medications, lifestyle changes endorsed by the patient to me.  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 Penicillins Anaphylaxis, Hives, Swelling and Rash    Has patient had a PCN reaction causing immediate rash, facial/tongue/throat swelling, SOB or lightheadedness with hypotension: Yes Has patient had a PCN reaction causing severe rash involving mucus membranes or skin necrosis: Yes Has patient had a PCN reaction that required hospitalization Yes Has patient had a PCN reaction occurring within the last 10 years: No If all of the above answers are "NO", then may proceed with Cephalosporin use.   . Cabbage Itching  . Keflex [Cephalexin] Hives    And feeling of throat tightness  . Shellfish Allergy Swelling  . Tomato Swelling  . Latex Itching and Rash   Prior to Admission medications   Medication Sig Start Date End Date Taking? Authorizing Provider  acetaminophen (TYLENOL) 500 MG tablet Take 500 mg by mouth every 6 (six) hours as needed for mild pain or moderate pain. pain   Yes [provider]  albuterol (PROAIR HFA) 108 (90 Base) MCG/ACT inhaler INHALE 2 PUFFS INTO THE LUNGS EVERY 6 (SIX) HOURS AS NEEDED  FOR WHEEZING OR SHORTNESS OF BREATH. 03/08/16  Yes Rivet, Iris Pert, MD  budesonide-formoterol (SYMBICORT) 160-4.5 MCG/ACT inhaler Inhale 2 puffs into the lungs 2 (two) times daily. 03/08/16  Yes Rivet, Iris Pert, MD  calcium citrate-vitamin D (CITRACAL+D) 315-200 MG-UNIT tablet Take 2 tablets by mouth daily. 06/21/16  Yes Rivet, Iris Pert, MD  Clobetasol Prop Emollient Base 0.05 % emollient cream APPLY 1 APPLICATION TOPICALLY 2 (TWO) TIMES DAILY. FOR TWO WEEKS 10/31/16 11/14/16 Yes Molt, Bethany, DO  fluticasone (FLONASE) 50 MCG/ACT nasal spray Place 2 sprays into both nostrils daily. 05/02/16  Yes Rivet, Iris Pert, MD  furosemide (LASIX) 40 MG tablet Take 1 tablet (40 mg total) by mouth every other day. 06/21/16  Yes Rivet, Iris Pert, MD  hydrocortisone (PROCTOSOL HC) 2.5 % rectal cream PLACE 1 APPLICATION RECTALLY 2 TIMES DAILY. 06/21/16  Yes Rivet, Iris Pert, MD  losartan (COZAAR) 100 MG tablet Take 1 tablet (100 mg total) by mouth daily. 10/25/16  Yes Molt, Bethany, DO  meclizine (ANTIVERT) 25 MG tablet Take 1 tablet (25 mg total) by mouth 3 (three) times daily as needed. 11/19/15  Yes Rivet, Iris Pert, MD  montelukast (SINGULAIR) 10 MG tablet Take 1 tablet (10 mg total) by mouth at bedtime. 02/19/16  Yes Rivet, Carly J, MD  pantoprazole (PROTONIX) 40 MG tablet TAKE 1 TABLET (40 MG TOTAL) BY MOUTH DAILY. 06/20/16  Yes Rivet, Iris Pert, MD  pravastatin (PRAVACHOL) 80 MG tablet TAKE 1 TABLET (80 MG TOTAL) BY MOUTH DAILY. 10/25/16  Yes Molt, Bethany, DO  RESTASIS MULTIDOSE 0.05 % ophthalmic emulsion Use once daily 08/19/16  Yes [provider]  warfarin (COUMADIN) 4 MG tablet TAKE 1 TABLET BY MOUTH DAILY--EXCEPT ON WEDNESDAYS--TAKE ONLY 1/2 TABLET ON WEDNESDAYS. 09/06/16  Yes Molt, Bethany, DO  carvedilol (COREG) 12.5 MG tablet Take 1 tablet (12.5 mg total) by mouth 2 (two) times daily. 07/25/16 10/23/16  Regan Lemming, MD   Past Medical History:  Diagnosis Date  . Arthritis   . Asthma   . Bronchitis   .  CHF (congestive heart failure) (HCC)   . DVT, lower extremity, recurrent (HCC)    Patient had unprovoked PE on 2002 and DVT in right lower extremety 2008.  [name] GERD (gastroesophageal reflux disease)   . History of kidney stones   . Hypertension   . PE (pulmonary embolism)    Patient had unprovoked PE on 2002  . PONV (postoperative nausea and vomiting)   . Vertigo    Social History   Social History  . Marital status: Divorced    Spouse name: N/A  . Number of children: N/A  . Years of education: N/A   Social History Main Topics  . Smoking status: Former Smoker    Types: Cigarettes    Quit date: 09/18/1988  . Smokeless tobacco: Never Used  . Alcohol use No  . Drug use: No  . Sexual activity: No   Other Topics Concern  . Not on file   Social History Narrative  . No narrative on file   Family History  Problem Relation Age of Onset  . Cancer Mother   . Hypertension Sister   . Hypertension Brother   . Diabetes Brother   . Hypertension Sister   . Colon cancer Other   . Heart Problems Other 34       sister's child, open heart surgery    ASSESSMENT Recent Results: The most recent result is correlated with 22.5 mg per week: Lab Results  Component Value Date   INR 2.5 11/07/2016   INR 2.5 10/31/2016   INR 3.30 10/03/2016    Anticoagulation Dosing: INR as of 11/07/2016 and Previous Warfarin Dosing Information    INR Dt INR Goal Jacki Cones Sun Mon Tue Wed Thu Fri Sat   11/07/2016 2.5 2.0-3.0 22 mg 4 mg 2 mg 4 mg 2 mg 4 mg 2 mg 4 mg    Previous description   Take 1 tablet of your blue-colored 4mg  strength warfarin tablets by mouth once-daily at 6PM all days of week--EXCEPT on Mondays, Wednesdays and Fridays-- take 1/2 tablet of your 4mg  blue warfarin tablet(s) on Mondays, Wednesdays and Fridays.    Anticoagulation Warfarin Dose Instructions as of 11/07/2016      Total Sun Mon Tue Wed Thu Fri Sat   New Dose 22 mg 4 mg 2 mg 4 mg 2 mg 4 mg 2 mg 4 mg     (4 mg x 1)  (4 mg x  0.5)  (4 mg x 1)  (4 mg x 0.5)  (4 mg x 1)  (4 mg x 0.5)  (4 mg x 1)                         Description   Take 1 tablet of your blue-colored 4mg  strength warfarin tablets by mouth once-daily at 6PM all days of week--EXCEPT on Mondays,  Wednesdays and Fridays-- take 1/2 tablet of your 4mg  blue warfarin tablet(s) on Mondays, Wednesdays and Fridays.      INR today: Therapeutic  PLAN Weekly dose was unchanged .  Patient Instructions  Patient instructed to take medications as defined in the Anti-coagulation Track section of this encounter.  Patient instructed to take today's dose.  Take 1 tablet of your blue-colored 4mg  strength warfarin tablets by mouth once-daily at 6PM all days of week--EXCEPT on Mondays, Wednesdays and Fridays-- take 1/2 tablet of your 4mg  blue warfarin tablet(s) on Mondays, Wednesdays and Fridays.  Patient verbalized understanding of these instructions.   Patient advised to contact clinic or seek medical attention if signs/symptoms of bleeding or thromboembolism occur.  Patient verbalized understanding by repeating back information and was advised to contact me if further medication-related questions arise. Patient was also provided an information handout.  Follow-up Return in 4 weeks (on 12/05/2016) for Follow up INR @ 1030, .  Elicia LampJames B Kimari Coudriet, PharmD, CACP, CPP  15 minutes spent face-to-face with the patient during the encounter. 50% of time spent on education. 50% of time was spent on fingerstick point of care INR sample collection, processing, results determination and documentation in [email or web address].

## 2016-11-07 NOTE — Progress Notes (Signed)
Anticoagulation Management Caroline [name] is a 71 y.o. female who reports to the clinic for monitoring of warfarin treatment.    Indication: DVT  Duration: indefinite Supervising physician: Doneen PoissonLawrence Klima  Anticoagulation Clinic Visit History: Patient does not report signs/symptoms of bleeding or thromboembolism  Other recent changes: No changes in diet, medications, lifestyle reported Anticoagulation Episode Summary    Current INR goal:   2.0-3.0  TTR:   86.4 % (1.8 y)  Next INR check:   12/05/2017  INR from last check:   2.5 (11/07/2016)  Weekly max warfarin dose:     Target end date:     INR check location:   [facility]  Preferred lab:     Send INR reminders to:      Indications   DVT lower extremity recurrent (HCC) [I82.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 Penicillins Anaphylaxis, Hives, Swelling and Rash    Has patient had a PCN reaction causing immediate rash, facial/tongue/throat swelling, SOB or lightheadedness with hypotension: Yes Has patient had a PCN reaction causing severe rash involving mucus membranes or skin necrosis: Yes Has patient had a PCN reaction that required hospitalization Yes Has patient had a PCN reaction occurring within the last 10 years: No If all of the above answers are "NO", then may proceed with Cephalosporin use.   . Cabbage Itching  . Keflex [Cephalexin] Hives    And feeling of throat tightness  . Shellfish Allergy Swelling  . Tomato Swelling  . Latex Itching and Rash   Prior to Admission medications   Medication Sig Start Date End Date Taking? Authorizing Provider  acetaminophen (TYLENOL) 500 MG tablet Take 500 mg by mouth every 6 (six) hours as needed for mild pain or moderate pain. pain   Yes [provider]  albuterol (PROAIR HFA) 108 (90 Base) MCG/ACT inhaler INHALE 2 PUFFS INTO THE LUNGS EVERY 6 (SIX) HOURS AS NEEDED FOR WHEEZING OR  SHORTNESS OF BREATH. 03/08/16  Yes Rivet, Iris Pertarly J, MD  budesonide-formoterol (SYMBICORT) 160-4.5 MCG/ACT inhaler Inhale 2 puffs into the lungs 2 (two) times daily. 03/08/16  Yes Rivet, Iris Pertarly J, MD  calcium citrate-vitamin D (CITRACAL+D) 315-200 MG-UNIT tablet Take 2 tablets by mouth daily. 06/21/16  Yes Rivet, Iris Pertarly J, MD  Clobetasol Prop Emollient Base 0.05 % emollient cream APPLY 1 APPLICATION TOPICALLY 2 (TWO) TIMES DAILY. FOR TWO WEEKS 10/31/16 11/14/16 Yes Molt, Bethany, DO  fluticasone (FLONASE) 50 MCG/ACT nasal spray Place 2 sprays into both nostrils daily. 05/02/16  Yes Rivet, Iris Pertarly J, MD  furosemide (LASIX) 40 MG tablet Take 1 tablet (40 mg total) by mouth every other day. 06/21/16  Yes Rivet, Iris Pertarly J, MD  hydrocortisone (PROCTOSOL HC) 2.5 % rectal cream PLACE 1 APPLICATION RECTALLY 2 TIMES DAILY. 06/21/16  Yes Rivet, Iris Pertarly J, MD  losartan (COZAAR) 100 MG tablet Take 1 tablet (100 mg total) by mouth daily. 10/25/16  Yes Molt, Bethany, DO  meclizine (ANTIVERT) 25 MG tablet Take 1 tablet (25 mg total) by mouth 3 (three) times daily as needed. 11/19/15  Yes Rivet, Iris Pertarly J, MD  montelukast (SINGULAIR) 10 MG tablet Take 1 tablet (10 mg total) by mouth at bedtime. 02/19/16  Yes Rivet, Carly J, MD  pantoprazole (PROTONIX) 40 MG tablet TAKE 1 TABLET (40 MG TOTAL) BY MOUTH DAILY. 06/20/16  Yes Rivet, Iris Pertarly J, MD  pravastatin (PRAVACHOL) 80 MG tablet  TAKE 1 TABLET (80 MG TOTAL) BY MOUTH DAILY. 10/25/16  Yes Molt, Bethany, DO  RESTASIS MULTIDOSE 0.05 % ophthalmic emulsion Use once daily 08/19/16  Yes [provider]  warfarin (COUMADIN) 4 MG tablet TAKE 1 TABLET BY MOUTH DAILY--EXCEPT ON WEDNESDAYS--TAKE ONLY 1/2 TABLET ON WEDNESDAYS. 09/06/16  Yes Molt, Bethany, DO  carvedilol (COREG) 12.5 MG tablet Take 1 tablet (12.5 mg total) by mouth 2 (two) times daily. 07/25/16 10/23/16  Regan Lemming, MD   Past Medical History:  Diagnosis Date  . Arthritis   . Asthma   . Bronchitis   . CHF (congestive  heart failure) (HCC)   . DVT, lower extremity, recurrent (HCC)    Patient had unprovoked PE on 2002 and DVT in right lower extremety 2008.  [name] GERD (gastroesophageal reflux disease)   . History of kidney stones   . Hypertension   . PE (pulmonary embolism)    Patient had unprovoked PE on 2002  . PONV (postoperative nausea and vomiting)   . Vertigo    Social History   Social History  . Marital status: Divorced    Spouse name: N/A  . Number of children: N/A  . Years of education: N/A   Social History Main Topics  . Smoking status: Former Smoker    Types: Cigarettes    Quit date: 09/18/1988  . Smokeless tobacco: Never Used  . Alcohol use No  . Drug use: No  . Sexual activity: No   Other Topics Concern  . Not on file   Social History Narrative  . No narrative on file   Family History  Problem Relation Age of Onset  . Cancer Mother   . Hypertension Sister   . Hypertension Brother   . Diabetes Brother   . Hypertension Sister   . Colon cancer Other   . Heart Problems Other 34       sister's child, open heart surgery    ASSESSMENT Recent Results: The most recent result is correlated with 22 mg per week: Lab Results  Component Value Date   INR 2.5 11/07/2016   INR 2.5 10/31/2016   INR 3.30 10/03/2016    Anticoagulation Dosing: INR as of 11/07/2016 and Previous Warfarin Dosing Information    INR Dt INR Goal Caroline [name] Sun Mon Tue Wed Thu Fri Sat   11/07/2016 2.5 2.0-3.0 22 mg 4 mg 2 mg 4 mg 2 mg 4 mg 2 mg 4 mg    Previous description   Take 1 tablet of your blue-colored 4mg  strength warfarin tablets by mouth once-daily at 6PM all days of week--EXCEPT on Mondays, Wednesdays and Fridays-- take 1/2 tablet of your 4mg  blue warfarin tablet(s) on Mondays, Wednesdays and Fridays.    Anticoagulation Warfarin Dose Instructions as of 11/07/2016      Total Sun Mon Tue Wed Thu Fri Sat   New Dose 22 mg 4 mg 2 mg 4 mg 2 mg 4 mg 2 mg 4 mg     (4 mg x 1)  (4 mg x 0.5)  (4 mg x  1)  (4 mg x 0.5)  (4 mg x 1)  (4 mg x 0.5)  (4 mg x 1)                         Description   Take 1 tablet of your blue-colored 4mg  strength warfarin tablets by mouth once-daily at 6PM all days of week--EXCEPT on Mondays, Wednesdays and Fridays-- take 1/2  tablet of your 4mg  blue warfarin tablet(s) on Mondays, Wednesdays and Fridays.      INR today: Therapeutic  PLAN Weekly dose was unchanged   Patient Instructions  Patient instructed to take medications as defined in the Anti-coagulation Track section of this encounter.  Patient instructed to take today's dose.  Take 1 tablet of your blue-colored 4mg  strength warfarin tablets by mouth once-daily at 6PM all days of week--EXCEPT on Mondays, Wednesdays and Fridays-- take 1/2 tablet of your 4mg  blue warfarin tablet(s) on Mondays, Wednesdays and Fridays.  Patient verbalized understanding of these instructions.   Patient advised to contact clinic or seek medical attention if signs/symptoms of bleeding or thromboembolism occur.  Patient verbalized understanding by repeating back information and was advised to contact me if further medication-related questions arise. Patient was also provided an information handout.  Follow-up Return in 4 weeks (on 12/05/2016) for Follow up INR @ 1030, .  Roderic Scarce Zigmund Daniel, PharmD PGY1 Pharmacy Resident  15 minutes spent face-to-face with the patient during the encounter. 50% of time spent on education. 50% of time was spent on point of care INR fingerstick, results interpretation, and documentation in [facility] and [email or web address].

## 2016-11-07 NOTE — Patient Instructions (Signed)
Patient instructed to take medications as defined in the Anti-coagulation Track section of this encounter.  Patient instructed to take today's dose.  Take 1 tablet of your blue-colored 4mg  strength warfarin tablets by mouth once-daily at 6PM all days of week--EXCEPT on Mondays, Wednesdays and Fridays-- take 1/2 tablet of your 4mg  blue warfarin tablet(s) on Mondays, Wednesdays and Fridays.  Patient verbalized understanding of these instructions.

## 2016-11-14 ENCOUNTER — Other Ambulatory Visit: Payer: Self-pay | Admitting: *Deleted

## 2016-11-14 DIAGNOSIS — Z6841 Body Mass Index (BMI) 40.0 and over, adult: Secondary | ICD-10-CM

## 2016-11-14 DIAGNOSIS — J4530 Mild persistent asthma, uncomplicated: Secondary | ICD-10-CM

## 2016-11-14 MED ORDER — MONTELUKAST SODIUM 10 MG PO TABS: 10.0000 mg | ORAL_TABLET | Freq: Every day | ORAL | 3 refills | Status: DC

## 2016-11-14 MED ORDER — CALCIUM CITRATE-VITAMIN D 315-200 MG-UNIT PO TABS: 2.0000 | ORAL_TABLET | Freq: Every day | ORAL | 3 refills | Status: DC

## 2016-11-14 NOTE — Progress Notes (Signed)
Indication: Recurrent deep venous thrombosis. Duration: Indefinite. INR: At target. Agree with Dr. Groce's assessment and plan. 

## 2016-11-15 ENCOUNTER — Other Ambulatory Visit: Payer: Self-pay | Admitting: Internal Medicine

## 2016-11-15 DIAGNOSIS — M793 Panniculitis, unspecified: Secondary | ICD-10-CM

## 2016-11-15 DIAGNOSIS — I8310 Varicose veins of unspecified lower extremity with inflammation: Secondary | ICD-10-CM

## 2016-11-15 NOTE — Telephone Encounter (Signed)
It appears she was referred to dermatology,- I believe Dr Margo Aye from the notes, can we get records to see if he wants to continue this treatment or if she has yet to be seen?

## 2016-11-17 NOTE — Progress Notes (Signed)
Indication: Recurrent venous thromboembolism. Duration: Indefinite. INR: At target. Agree with Dr. Groce's assessment and plan. 

## 2016-11-22 ENCOUNTER — Other Ambulatory Visit: Payer: Self-pay | Admitting: *Deleted

## 2016-11-22 DIAGNOSIS — R42 Dizziness and giddiness: Secondary | ICD-10-CM

## 2016-11-23 MED ORDER — MECLIZINE HCL 25 MG PO TABS: 25.0000 mg | ORAL_TABLET | Freq: Three times a day (TID) | ORAL | 1 refills | Status: DC | PRN

## 2016-11-23 MED ORDER — FLUTICASONE PROPIONATE 50 MCG/ACT NA SUSP: 2.0000 | Freq: Every day | NASAL | 0 refills | Status: DC

## 2016-11-28 NOTE — Progress Notes (Signed)
   CC: follow-up of HTN, cellulitis.   HPI:  [name] is a 71 y.o. F with combined CHF, hx of recurrent DVT/PE, and recent recurrent cellulitis here for follow-up of the above.   Please see problem based charting for further details, assessment and plan.   Past Medical History:  Diagnosis Date  . Arthritis   . Asthma   . Bronchitis   . CHF (congestive heart failure) (HCC)   . DVT, lower extremity, recurrent (HCC)    Patient had unprovoked PE on 2002 and DVT in right lower extremety 2008.  [name] GERD (gastroesophageal reflux disease)   . History of kidney stones   . Hypertension   . PE (pulmonary embolism)    Patient had unprovoked PE on 2002  . PONV (postoperative nausea and vomiting)   . Vertigo    Review of Systems:   General: Denies fevers, chills, weight loss, fatigue Cardiac: Denies CP, SOB, palpitations Pulmonary: Denies cough, wheezes, PND Abd: Denies diarrhea, constipation, changes in bowels Extremities: Denies weakness. Swelling at baseline. No erythema.   Physical Exam: General: Alert, in no acute distress. Pleasant and conversant HEENT: No icterus, injection or ptosis. No hoarseness or dysarthria  Cardiac: RRR, no MGR appreciated Pulmonary: CTA BL with normal WOB on RA. Able to speak in complete sentences Abd: Soft, non-tended. +bs Extremities: Warm, perfused. Trace pitting edema at ankles BL.   Vitals:   11/29/16 1327  BP: (!) 146/61  Pulse: 75  Temp: 97.9 F (36.6 C)  TempSrc: Oral  SpO2: 97%  Weight: 284 lb 9.6 oz (129.1 kg)  Height: 5\' 3"  (1.6 m)    Assessment & Plan:   See Encounters Tab for problem based charting.  Patient discussed with Dr. Cleda Daub

## 2016-11-29 ENCOUNTER — Encounter: Payer: Self-pay | Admitting: Internal Medicine

## 2016-11-29 ENCOUNTER — Other Ambulatory Visit: Payer: Self-pay

## 2016-11-29 ENCOUNTER — Ambulatory Visit (INDEPENDENT_AMBULATORY_CARE_PROVIDER_SITE_OTHER): Payer: Medicare Other | Admitting: Internal Medicine

## 2016-11-29 VITALS — BP 146/61 | HR 75 | Temp 97.9°F | Ht 63.0 in | Wt 284.6 lb

## 2016-11-29 DIAGNOSIS — I5042 Chronic combined systolic (congestive) and diastolic (congestive) heart failure: Secondary | ICD-10-CM

## 2016-11-29 DIAGNOSIS — I82409 Acute embolism and thrombosis of unspecified deep veins of unspecified lower extremity: Secondary | ICD-10-CM

## 2016-11-29 DIAGNOSIS — Z86718 Personal history of other venous thrombosis and embolism: Secondary | ICD-10-CM | POA: Diagnosis not present

## 2016-11-29 DIAGNOSIS — Z79899 Other long term (current) drug therapy: Secondary | ICD-10-CM

## 2016-11-29 DIAGNOSIS — I10 Essential (primary) hypertension: Secondary | ICD-10-CM

## 2016-11-29 DIAGNOSIS — Z872 Personal history of diseases of the skin and subcutaneous tissue: Secondary | ICD-10-CM

## 2016-11-29 DIAGNOSIS — I110 Hypertensive heart disease with heart failure: Secondary | ICD-10-CM | POA: Diagnosis not present

## 2016-11-29 DIAGNOSIS — I8310 Varicose veins of unspecified lower extremity with inflammation: Secondary | ICD-10-CM | POA: Diagnosis not present

## 2016-11-29 DIAGNOSIS — Z7901 Long term (current) use of anticoagulants: Secondary | ICD-10-CM

## 2016-11-29 MED ORDER — FUROSEMIDE 40 MG PO TABS: 40.0000 mg | ORAL_TABLET | ORAL | 1 refills | Status: DC

## 2016-11-29 MED ORDER — CARVEDILOL 12.5 MG PO TABS: 12.5000 mg | ORAL_TABLET | Freq: Two times a day (BID) | ORAL | 3 refills | Status: DC

## 2016-11-29 MED ORDER — LOSARTAN POTASSIUM 100 MG PO TABS: 100.0000 mg | ORAL_TABLET | Freq: Every day | ORAL | 1 refills | Status: DC

## 2016-11-29 MED ORDER — PANTOPRAZOLE SODIUM 40 MG PO TBEC: 40.0000 mg | DELAYED_RELEASE_TABLET | Freq: Every day | ORAL | 1 refills | Status: DC

## 2016-11-29 MED ORDER — PRAVASTATIN SODIUM 80 MG PO TABS: ORAL_TABLET | ORAL | 1 refills | Status: DC

## 2016-11-29 NOTE — Assessment & Plan Note (Signed)
Minimal pedal edema today. Aware patients weight up several lbs recently however she attributes this to eating poorly recently. She has no crackles or rales on examination and in addition she denies any respiratory symptoms. -Continue Lasix 40 mg every other day -Discussed importance of dietary discretion, especially around the holidays -Patient to follow up with her cardiologist within the next few weeks

## 2016-11-29 NOTE — Assessment & Plan Note (Signed)
BP above goal at 146/61. Notes compliance with Losartan 100mg  and Coreg 12.5mg  BID. Pulse 75 today, could likely tolerate maximizing coreg for pulse ~60 for CHF however this medication managed by her cardiologists, with whom she has an appointment within the next few weeks.  -Continue Losartan 100mg  -Continue Coreg 12.5mg  BID

## 2016-11-29 NOTE — Assessment & Plan Note (Signed)
Patient on Coumadin indefinitely for repeated unprovoked DVT and PE's. Last IRN appropriate at 2.5. She follows regularly with the coumadin clinic.

## 2016-11-29 NOTE — Patient Instructions (Signed)
It was a pleasure finally getting to meet you today! I'm glad you are doing so well.   Please continue taking your current medications as prescribed. I will send in a 3 month supply of your medications to [facility].   Please keep your appointment for the coumadin clinic and also call your cardiologist for follow-up.   Have a happy thanksgiving and a wonderful new year!!!

## 2016-11-30 NOTE — Progress Notes (Signed)
Internal Medicine Clinic Attending  Case discussed with Dr. Molt at the time of the visit.  We reviewed the resident's history and exam and pertinent patient test results.  I agree with the assessment, diagnosis, and plan of care documented in the resident's note. 

## 2016-12-05 ENCOUNTER — Ambulatory Visit (INDEPENDENT_AMBULATORY_CARE_PROVIDER_SITE_OTHER): Payer: Medicare Other | Admitting: Pharmacy Technician

## 2016-12-05 DIAGNOSIS — Z86711 Personal history of pulmonary embolism: Secondary | ICD-10-CM

## 2016-12-05 DIAGNOSIS — I82409 Acute embolism and thrombosis of unspecified deep veins of unspecified lower extremity: Secondary | ICD-10-CM

## 2016-12-05 DIAGNOSIS — I82509 Chronic embolism and thrombosis of unspecified deep veins of unspecified lower extremity: Secondary | ICD-10-CM | POA: Diagnosis not present

## 2016-12-05 DIAGNOSIS — Z7901 Long term (current) use of anticoagulants: Secondary | ICD-10-CM

## 2016-12-05 LAB — POCT INR: INR: 2.1

## 2016-12-05 NOTE — Progress Notes (Signed)
Anticoagulation Management Caroline [name] is a 71 y.o. female who reports to the clinic for monitoring of warfarin treatment.    Indication: Recurrent DVT  Duration: indefinite Supervising physician: Jessy Oto, MD  Anticoagulation Clinic Visit History: Patient does not report signs/symptoms of bleeding or thromboembolism  Other recent changes: No diet, medications, lifestyle changes reported to me by patient at this visit. Anticoagulation Episode Summary    Current INR goal:   2.0-3.0  TTR:   87.0 % (1.8 y)  Next INR check:   12/26/2016  INR from last check:   2.1 (12/05/2016)  Weekly max warfarin dose:     Target end date:     INR check location:   [facility]  Preferred lab:     Send INR reminders to:      Indications   DVT lower extremity recurrent (HCC) [I82.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 Penicillins Anaphylaxis, Hives, Swelling and Rash    Has patient had a PCN reaction causing immediate rash, facial/tongue/throat swelling, SOB or lightheadedness with hypotension: Yes Has patient had a PCN reaction causing severe rash involving mucus membranes or skin necrosis: Yes Has patient had a PCN reaction that required hospitalization Yes Has patient had a PCN reaction occurring within the last 10 years: No If all of the above answers are "NO", then may proceed with Cephalosporin use.   . Cabbage Itching  . Keflex [Cephalexin] Hives    And feeling of throat tightness  . Shellfish Allergy Swelling  . Tomato Swelling  . Latex Itching and Rash   Prior to Admission medications   Medication Sig Start Date End Date Taking? Authorizing Provider  acetaminophen (TYLENOL) 500 MG tablet Take 500 mg by mouth every 6 (six) hours as needed for mild pain or moderate pain. pain   Yes [provider]  albuterol (PROAIR HFA) 108 (90 Base) MCG/ACT inhaler INHALE 2 PUFFS INTO THE LUNGS EVERY  6 (SIX) HOURS AS NEEDED FOR WHEEZING OR SHORTNESS OF BREATH. 03/08/16  Yes Rivet, Iris Pert, MD  budesonide-formoterol (SYMBICORT) 160-4.5 MCG/ACT inhaler Inhale 2 puffs into the lungs 2 (two) times daily. 03/08/16  Yes Rivet, Iris Pert, MD  calcium citrate-vitamin D (CITRACAL+D) 315-200 MG-UNIT tablet Take 2 tablets daily by mouth. 11/14/16  Yes [name], MD  carvedilol (COREG) 12.5 MG tablet Take 1 tablet (12.5 mg total) by mouth 2 (two) times daily. 11/29/16 02/27/17 Yes Molt, Bethany, DO  fluticasone (FLONASE) 50 MCG/ACT nasal spray Place 2 sprays daily into both nostrils. 11/23/16  Yes Molt, Bethany, DO  furosemide (LASIX) 40 MG tablet Take 1 tablet (40 mg total) by mouth every other day. 11/29/16  Yes Molt, Bethany, DO  hydrocortisone (PROCTOSOL HC) 2.5 % rectal cream PLACE 1 APPLICATION RECTALLY 2 TIMES DAILY. 06/21/16  Yes Rivet, Iris Pert, MD  losartan (COZAAR) 100 MG tablet Take 1 tablet (100 mg total) by mouth daily. 11/29/16  Yes Molt, Bethany, DO  meclizine (ANTIVERT) 25 MG tablet Take 1 tablet (25 mg total) 3 (three) times daily as needed by mouth. 11/23/16  Yes Molt, Bethany, DO  montelukast (SINGULAIR) 10 MG tablet Take 1 tablet (10 mg total) at bedtime by mouth. 11/14/16  Yes [name], MD  pantoprazole (PROTONIX) 40 MG tablet Take 1 tablet (40 mg total) by mouth daily. 11/29/16  Yes Molt, Bethany, DO  pravastatin (PRAVACHOL) 80 MG tablet  TAKE 1 TABLET (80 MG TOTAL) BY MOUTH DAILY. 11/29/16  Yes Molt, Bethany, DO  RESTASIS MULTIDOSE 0.05 % ophthalmic emulsion Use once daily 08/19/16  Yes [provider]  warfarin (COUMADIN) 4 MG tablet TAKE 1 TABLET BY MOUTH DAILY--EXCEPT ON WEDNESDAYS--TAKE ONLY 1/2 TABLET ON WEDNESDAYS. 09/06/16  Yes Molt, Bethany, DO   Past Medical History:  Diagnosis Date  . Arthritis   . Asthma   . Bronchitis   . CHF (congestive heart failure) (HCC)   . DVT, lower extremity, recurrent (HCC)    Patient had unprovoked PE on 2002 and DVT in right  lower extremety 2008.  [name] GERD (gastroesophageal reflux disease)   . History of kidney stones   . Hypertension   . PE (pulmonary embolism)    Patient had unprovoked PE on 2002  . PONV (postoperative nausea and vomiting)   . Vertigo    Social History   Socioeconomic History  . Marital status: Divorced    Spouse name: Not on file  . Number of children: Not on file  . Years of education: Not on file  . Highest education level: Not on file  Social Needs  . Financial resource strain: Not on file  . Food insecurity - worry: Not on file  . Food insecurity - inability: Not on file  . Transportation needs - medical: Not on file  . Transportation needs - non-medical: Not on file  Occupational History  . Not on file  Tobacco Use  . Smoking status: Former Smoker    Types: Cigarettes    Last attempt to quit: 09/18/1988    Years since quitting: 28.2  . Smokeless tobacco: Never Used  Substance and Sexual Activity  . Alcohol use: No  . Drug use: No  . Sexual activity: No    Birth control/protection: None  Other Topics Concern  . Not on file  Social History Narrative  . Not on file   Family History  Problem Relation Age of Onset  . Cancer Mother   . Hypertension Sister   . Hypertension Brother   . Diabetes Brother   . Hypertension Sister   . Colon cancer Other   . Heart Problems Other 34       sister's child, open heart surgery    ASSESSMENT Recent Results: The most recent result is correlated with 22 mg per week: Lab Results  Component Value Date   INR 2.1 12/05/2016   INR 2.5 11/07/2016   INR 2.5 10/31/2016    Anticoagulation Dosing: Description   Take 1 tablet of your blue-colored 4mg  strength warfarin tablets by mouth once-daily at 6PM all days of week--EXCEPT on Wednesdays and Fridays-- take 1/2 tablet of your 4mg  blue warfarin tablet(s) on Wednesdays and Fridays.      INR today: Lower end of therapeutic  PLAN Weekly dose was increased by 9% to 24 mg per  week  Patient Instructions  Patient instructed to take medications as defined in the Anti-coagulation Track section of this encounter.  Patient instructed to take today's dose.  Patient instructed to take 1 tablet of your blue-colored 4mg  strength warfarin tablets by mouth once-daily at 6PM all days of week--EXCEPT on Wednesdays and Fridays-- take 1/2 tablet of your 4mg  blue warfarin tablet(s) on Wednesdays and Fridays.  Patient verbalized understanding of these instructions.    Patient advised to contact clinic or seek medical attention if signs/symptoms of bleeding or thromboembolism occur.  Patient verbalized understanding by repeating back information and was advised to  contact me if further medication-related questions arise. Patient was also provided an information handout.  Follow-up Return in about 3 weeks (around 12/26/2016) for INR follow up at 11:00AM. . Daylene PoseyJonathan Haunani Dickard, PharmD Pharmacy Resident Pager #: [phone] 12/05/2016 11:05 AM   15 minutes spent face-to-face with the patient during the encounter. 50% of time spent on education. 50% of time was spent on INR point of care testing, results interpretation, dose adjustment, documentation in EMR and [email or web address].

## 2016-12-05 NOTE — Patient Instructions (Signed)
Patient instructed to take medications as defined in the Anti-coagulation Track section of this encounter.  Patient instructed to take today's dose.  Patient instructed to take 1 tablet of your blue-colored 4mg  strength warfarin tablets by mouth once-daily at 6PM all days of week--EXCEPT on Wednesdays and Fridays-- take 1/2 tablet of your 4mg  blue warfarin tablet(s) on Wednesdays and Fridays.  Patient verbalized understanding of these instructions.

## 2016-12-05 NOTE — Progress Notes (Signed)
INTERNAL MEDICINE TEACHING ATTENDING ADDENDUM  I agree with pharmacy recommendations as outlined in their note.   Alexander N Raines, MD  

## 2016-12-14 ENCOUNTER — Other Ambulatory Visit: Payer: Self-pay

## 2016-12-14 NOTE — Patient Outreach (Signed)
[facility]) Care Management  12/14/16  Caroline [name] 1945-01-14 [id number]  Successful outreach completed with patient. Patient identification verified.  RNCM provided education about program and role of [facility] RNCM.  Patient stated that she currently feels she has no nursing needs. She stated that she is going to dermatologist. Dr. Margo Aye for her cellulitis and it is healing up real well. She stated so far everything is going good and she does not have any needs.  RNCM followed up regarding patient's HTN and she stated that her BP has been doing ok. She stated that she saw her PCP recently (11/29/2016) and that appointment went well. She stated that she does not have to follow up again for a year unless she has problems.  She stated that her last INR was better controlled. She stated it was 2.1. She stated her coumadin changed from taking 1/2 pill MWF and a whole pill all other days to taking 1/2 on W and F only and taking a whole pill all other days. Patient was able to verbalize a good understanding of her health and denies any issues or concerns. RNCM provided contact information and encouraged her to call if she feels she has any needs, concerns or questions, or if her condition changes and she needs more support and patient verbalized understanding. Plan: RNCM to perform case closure.  Turkey R. Easton Fetty, RN, BSN, CCM [facility] Care Management Coordinator [phone]

## 2016-12-21 ENCOUNTER — Other Ambulatory Visit: Payer: Self-pay | Admitting: Internal Medicine

## 2016-12-26 ENCOUNTER — Ambulatory Visit (INDEPENDENT_AMBULATORY_CARE_PROVIDER_SITE_OTHER): Payer: Medicare Other | Admitting: Pharmacy Technician

## 2016-12-26 DIAGNOSIS — I82409 Acute embolism and thrombosis of unspecified deep veins of unspecified lower extremity: Secondary | ICD-10-CM | POA: Diagnosis not present

## 2016-12-26 DIAGNOSIS — Z86711 Personal history of pulmonary embolism: Secondary | ICD-10-CM

## 2016-12-26 DIAGNOSIS — Z7901 Long term (current) use of anticoagulants: Secondary | ICD-10-CM

## 2016-12-26 LAB — POCT INR: INR: 2

## 2016-12-26 NOTE — Patient Instructions (Signed)
Patient instructed to take medications as defined in the Anti-coagulation Track section of this encounter.  Patient instructed to take today's dose.  Patient instructed to take 1 tablet of your blue-colored 4mg  strength warfarin tablets by mouth once-daily at 6PM all days of week--EXCEPT on Wednesdays-- take 1/2 tablet of your 4mg  blue warfarin tablet(s) on Wednesdays. Patient verbalized understanding of these instructions.

## 2016-12-26 NOTE — Progress Notes (Signed)
Indication: Recurrent deep venous thromboses. Duration: Indefinite. INR: At target. Agree with Dr. Bobbye Charleston assessment and plan.

## 2016-12-26 NOTE — Progress Notes (Signed)
Anticoagulation Management Caroline [name] is a 71 y.o. female who reports to the clinic for monitoring of warfarin treatment.    Indication: Recurrent DVT, hx of pulmonary embolism  Duration: indefinite Supervising physician: [name]  Anticoagulation Clinic Visit History: Patient does not report signs/symptoms of bleeding or thromboembolism  Other recent changes: No diet, medications, or lifestyle changes endorsed to me at this visit. Anticoagulation Episode Summary    Current INR goal:   2.0-3.0  TTR:   87.4 % (1.9 y)  Next INR check:   01/16/2017  INR from last check:   2.00 (12/26/2016)  Weekly max warfarin dose:     Target end date:     INR check location:   [facility]  Preferred lab:     Send INR reminders to:      Indications   DVT lower extremity recurrent (HCC) [I82.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 Penicillins Anaphylaxis, Hives, Swelling and Rash    Has patient had a PCN reaction causing immediate rash, facial/tongue/throat swelling, SOB or lightheadedness with hypotension: Yes Has patient had a PCN reaction causing severe rash involving mucus membranes or skin necrosis: Yes Has patient had a PCN reaction that required hospitalization Yes Has patient had a PCN reaction occurring within the last 10 years: No If all of the above answers are "NO", then may proceed with Cephalosporin use.   . Cabbage Itching  . Keflex [Cephalexin] Hives    And feeling of throat tightness  . Shellfish Allergy Swelling  . Tomato Swelling  . Latex Itching and Rash   Prior to Admission medications   Medication Sig Start Date End Date Taking? Authorizing Provider  acetaminophen (TYLENOL) 500 MG tablet Take 500 mg by mouth every 6 (six) hours as needed for mild pain or moderate pain. pain   Yes [provider]  albuterol (PROAIR HFA) 108 (90 Base) MCG/ACT inhaler INHALE 2 PUFFS INTO THE  LUNGS EVERY 6 (SIX) HOURS AS NEEDED FOR WHEEZING OR SHORTNESS OF BREATH. 03/08/16  Yes Rivet, Iris Pert, MD  budesonide-formoterol (SYMBICORT) 160-4.5 MCG/ACT inhaler Inhale 2 puffs into the lungs 2 (two) times daily. 03/08/16  Yes Rivet, Iris Pert, MD  calcium citrate-vitamin D (CITRACAL+D) 315-200 MG-UNIT tablet Take 2 tablets daily by mouth. 11/14/16  Yes [name], MD  carvedilol (COREG) 12.5 MG tablet Take 1 tablet (12.5 mg total) by mouth 2 (two) times daily. 11/29/16 02/27/17 Yes Molt, Bethany, DO  fluticasone (FLONASE) 50 MCG/ACT nasal spray PLACE 2 SPRAYS DAILY INTO BOTH NOSTRILS. 12/21/16  Yes Molt, Bethany, DO  furosemide (LASIX) 40 MG tablet Take 1 tablet (40 mg total) by mouth every other day. 11/29/16  Yes Molt, Bethany, DO  hydrocortisone (PROCTOSOL HC) 2.5 % rectal cream PLACE 1 APPLICATION RECTALLY 2 TIMES DAILY. 06/21/16  Yes Rivet, Iris Pert, MD  losartan (COZAAR) 100 MG tablet Take 1 tablet (100 mg total) by mouth daily. 11/29/16  Yes Molt, Bethany, DO  meclizine (ANTIVERT) 25 MG tablet Take 1 tablet (25 mg total) 3 (three) times daily as needed by mouth. 11/23/16  Yes Molt, Bethany, DO  montelukast (SINGULAIR) 10 MG tablet Take 1 tablet (10 mg total) at bedtime by mouth. 11/14/16  Yes [name], MD  pantoprazole (PROTONIX) 40 MG tablet Take 1 tablet (40 mg total) by mouth daily. 11/29/16  Yes Molt, Bethany, DO  pravastatin (PRAVACHOL) 80  MG tablet TAKE 1 TABLET (80 MG TOTAL) BY MOUTH DAILY. 11/29/16  Yes Molt, Bethany, DO  RESTASIS MULTIDOSE 0.05 % ophthalmic emulsion Use once daily 08/19/16  Yes [provider]  warfarin (COUMADIN) 4 MG tablet TAKE 1 TABLET BY MOUTH DAILY--EXCEPT ON WEDNESDAYS--TAKE ONLY 1/2 TABLET ON WEDNESDAYS. 09/06/16  Yes Molt, Bethany, DO   Past Medical History:  Diagnosis Date  . Arthritis   . Asthma   . Bronchitis   . CHF (congestive heart failure) (HCC)   . DVT, lower extremity, recurrent (HCC)    Patient had unprovoked PE on 2002 and DVT  in right lower extremety 2008.  [name] GERD (gastroesophageal reflux disease)   . History of kidney stones   . Hypertension   . PE (pulmonary embolism)    Patient had unprovoked PE on 2002  . PONV (postoperative nausea and vomiting)   . Vertigo    Social History   Socioeconomic History  . Marital status: Divorced    Spouse name: Not on file  . Number of children: Not on file  . Years of education: Not on file  . Highest education level: Not on file  Social Needs  . Financial resource strain: Not on file  . Food insecurity - worry: Not on file  . Food insecurity - inability: Not on file  . Transportation needs - medical: Not on file  . Transportation needs - non-medical: Not on file  Occupational History  . Not on file  Tobacco Use  . Smoking status: Former Smoker    Types: Cigarettes    Last attempt to quit: 09/18/1988    Years since quitting: 28.2  . Smokeless tobacco: Never Used  Substance and Sexual Activity  . Alcohol use: No  . Drug use: No  . Sexual activity: No    Birth control/protection: None  Other Topics Concern  . Not on file  Social History Narrative  . Not on file   Family History  Problem Relation Age of Onset  . Cancer Mother   . Hypertension Sister   . Hypertension Brother   . Diabetes Brother   . Hypertension Sister   . Colon cancer Other   . Heart Problems Other 34       sister's child, open heart surgery    ASSESSMENT Recent Results: The most recent result is correlated with 18 mg per week: Lab Results  Component Value Date   INR 2.00 12/26/2016   INR 2.1 12/05/2016   INR 2.5 11/07/2016    Anticoagulation Dosing: Description   Take 1 tablet of your blue-colored 4mg  strength warfarin tablets by mouth once-daily at 6PM all days of week--EXCEPT on Wednesdays-- take 1/2 tablet of your 4mg  blue warfarin tablet(s) on Wednesdays.     INR today: Lower end of therapeutic  PLAN Weekly dose was increased by 8.3% to 19.5 mg per week  Patient  Instructions  Patient instructed to take medications as defined in the Anti-coagulation Track section of this encounter.  Patient instructed to take today's dose.  Patient instructed to take 1 tablet of your blue-colored 4mg  strength warfarin tablets by mouth once-daily at 6PM all days of week--EXCEPT on Wednesdays-- take 1/2 tablet of your 4mg  blue warfarin tablet(s) on Wednesdays. Patient verbalized understanding of these instructions.     Patient advised to contact clinic or seek medical attention if signs/symptoms of bleeding or thromboembolism occur.  Patient verbalized understanding by repeating back information and was advised to contact me if further medication-related questions arise.  Patient was also provided an information handout.  Follow-up Return in about 3 weeks (around 01/16/2017) for INR follow up at 1100.  Daylene PoseyJonathan Mckensi Redinger, PharmD Pharmacy Resident Pager #: [phone] 12/26/2016 11:13 AM  15 minutes spent face-to-face with the patient during the encounter. 50% of time spent on education. 50% of time was spent on INR point of care testing, results interpretation, and documentation.

## 2016-12-30 ENCOUNTER — Telehealth: Payer: Self-pay | Admitting: *Deleted

## 2016-12-30 NOTE — Telephone Encounter (Signed)
Review medications

## 2016-12-31 ENCOUNTER — Encounter (HOSPITAL_COMMUNITY): Payer: Self-pay | Admitting: *Deleted

## 2016-12-31 ENCOUNTER — Other Ambulatory Visit: Payer: Self-pay

## 2016-12-31 ENCOUNTER — Observation Stay (HOSPITAL_COMMUNITY)
Admission: EM | Admit: 2016-12-31 | Discharge: 2016-12-31 | Disposition: A | Discharge status: Home / Self Care | Payer: Medicare Other | Attending: Internal Medicine | Admitting: Internal Medicine

## 2016-12-31 DIAGNOSIS — Z86711 Personal history of pulmonary embolism: Secondary | ICD-10-CM | POA: Diagnosis not present

## 2016-12-31 DIAGNOSIS — Z7951 Long term (current) use of inhaled steroids: Secondary | ICD-10-CM | POA: Diagnosis not present

## 2016-12-31 DIAGNOSIS — R7989 Other specified abnormal findings of blood chemistry: Secondary | ICD-10-CM

## 2016-12-31 DIAGNOSIS — M1990 Unspecified osteoarthritis, unspecified site: Secondary | ICD-10-CM | POA: Insufficient documentation

## 2016-12-31 DIAGNOSIS — Z79899 Other long term (current) drug therapy: Secondary | ICD-10-CM | POA: Insufficient documentation

## 2016-12-31 DIAGNOSIS — Z87442 Personal history of urinary calculi: Secondary | ICD-10-CM | POA: Diagnosis not present

## 2016-12-31 DIAGNOSIS — Z8249 Family history of ischemic heart disease and other diseases of the circulatory system: Secondary | ICD-10-CM

## 2016-12-31 DIAGNOSIS — J45909 Unspecified asthma, uncomplicated: Secondary | ICD-10-CM

## 2016-12-31 DIAGNOSIS — Z87891 Personal history of nicotine dependence: Secondary | ICD-10-CM | POA: Diagnosis not present

## 2016-12-31 DIAGNOSIS — Z91018 Allergy to other foods: Secondary | ICD-10-CM | POA: Insufficient documentation

## 2016-12-31 DIAGNOSIS — Z91040 Latex allergy status: Secondary | ICD-10-CM

## 2016-12-31 DIAGNOSIS — A0472 Enterocolitis due to Clostridium difficile, not specified as recurrent: Secondary | ICD-10-CM | POA: Diagnosis not present

## 2016-12-31 DIAGNOSIS — I5042 Chronic combined systolic (congestive) and diastolic (congestive) heart failure: Secondary | ICD-10-CM | POA: Insufficient documentation

## 2016-12-31 DIAGNOSIS — Z872 Personal history of diseases of the skin and subcutaneous tissue: Secondary | ICD-10-CM | POA: Diagnosis not present

## 2016-12-31 DIAGNOSIS — Z6841 Body Mass Index (BMI) 40.0 and over, adult: Secondary | ICD-10-CM | POA: Insufficient documentation

## 2016-12-31 DIAGNOSIS — Z8619 Personal history of other infectious and parasitic diseases: Secondary | ICD-10-CM

## 2016-12-31 DIAGNOSIS — Z888 Allergy status to other drugs, medicaments and biological substances status: Secondary | ICD-10-CM | POA: Diagnosis not present

## 2016-12-31 DIAGNOSIS — I951 Orthostatic hypotension: Secondary | ICD-10-CM

## 2016-12-31 DIAGNOSIS — Z7901 Long term (current) use of anticoagulants: Secondary | ICD-10-CM

## 2016-12-31 DIAGNOSIS — E876 Hypokalemia: Secondary | ICD-10-CM | POA: Insufficient documentation

## 2016-12-31 DIAGNOSIS — N179 Acute kidney failure, unspecified: Secondary | ICD-10-CM | POA: Diagnosis not present

## 2016-12-31 DIAGNOSIS — R197 Diarrhea, unspecified: Secondary | ICD-10-CM | POA: Diagnosis present

## 2016-12-31 DIAGNOSIS — Z880 Allergy status to penicillin: Secondary | ICD-10-CM

## 2016-12-31 DIAGNOSIS — E669 Obesity, unspecified: Secondary | ICD-10-CM | POA: Diagnosis not present

## 2016-12-31 DIAGNOSIS — I110 Hypertensive heart disease with heart failure: Secondary | ICD-10-CM | POA: Diagnosis not present

## 2016-12-31 DIAGNOSIS — Z86718 Personal history of other venous thrombosis and embolism: Secondary | ICD-10-CM

## 2016-12-31 DIAGNOSIS — J4540 Moderate persistent asthma, uncomplicated: Secondary | ICD-10-CM | POA: Diagnosis not present

## 2016-12-31 DIAGNOSIS — K219 Gastro-esophageal reflux disease without esophagitis: Secondary | ICD-10-CM | POA: Diagnosis not present

## 2016-12-31 DIAGNOSIS — E785 Hyperlipidemia, unspecified: Secondary | ICD-10-CM | POA: Diagnosis not present

## 2016-12-31 DIAGNOSIS — Z881 Allergy status to other antibiotic agents status: Secondary | ICD-10-CM | POA: Insufficient documentation

## 2016-12-31 DIAGNOSIS — A09 Infectious gastroenteritis and colitis, unspecified: Secondary | ICD-10-CM | POA: Diagnosis not present

## 2016-12-31 DIAGNOSIS — Z91013 Allergy to seafood: Secondary | ICD-10-CM

## 2016-12-31 HISTORY — DX: Personal history of other infectious and parasitic diseases: Z86.19

## 2016-12-31 LAB — BASIC METABOLIC PANEL
Anion gap: 8 (ref 5–15)
BUN: 11 mg/dL (ref 6–20)
CHLORIDE: 104 mmol/L (ref 101–111)
CO2: 28 mmol/L (ref 22–32)
CREATININE: 0.96 mg/dL (ref 0.44–1.00)
Calcium: 8.5 mg/dL — ABNORMAL LOW (ref 8.9–10.3)
GFR calc Af Amer: >60 mL/min (ref >60)
GFR calc non Af Amer: 59 mL/min — ABNORMAL LOW (ref >60)
GLUCOSE: 157 mg/dL — AB (ref 65–99)
Potassium: 3.2 mmol/L — ABNORMAL LOW (ref 3.5–5.1)
Sodium: 140 mmol/L (ref 135–145)

## 2016-12-31 LAB — MAGNESIUM: Magnesium: 1.6 mg/dL — ABNORMAL LOW (ref 1.7–2.4)

## 2016-12-31 LAB — GASTROINTESTINAL PANEL BY PCR, STOOL (REPLACES STOOL CULTURE)

## 2016-12-31 LAB — PROTIME-INR
INR: 2.16
Prothrombin Time: 23.9 seconds — ABNORMAL HIGH (ref 11.4–15.2)

## 2016-12-31 LAB — URINALYSIS, ROUTINE W REFLEX MICROSCOPIC
BILIRUBIN URINE: NEGATIVE
Glucose, UA: NEGATIVE mg/dL
Ketones, ur: NEGATIVE mg/dL
Nitrite: NEGATIVE
PROTEIN: 30 mg/dL — AB
SPECIFIC GRAVITY, URINE: 1.024 (ref 1.005–1.030)
pH: 5 (ref 5.0–8.0)

## 2016-12-31 LAB — CBC
HEMATOCRIT: 34.2 % — AB (ref 36.0–46.0)
Hemoglobin: 10.6 g/dL — ABNORMAL LOW (ref 12.0–15.0)
MCH: 24.9 pg — AB (ref 26.0–34.0)
MCHC: 31 g/dL (ref 30.0–36.0)
MCV: 80.5 fL (ref 78.0–100.0)
PLATELETS: 239 10*3/uL (ref 150–400)
RBC: 4.25 MIL/uL (ref 3.87–5.11)
RDW: 15.4 % (ref 11.5–15.5)
WBC: 11 10*3/uL — ABNORMAL HIGH (ref 4.0–10.5)

## 2016-12-31 LAB — C DIFFICILE QUICK SCREEN W PCR REFLEX
C DIFFICILE (CDIFF) TOXIN: NEGATIVE
C Diff antigen: POSITIVE — AB

## 2016-12-31 LAB — COMPREHENSIVE METABOLIC PANEL
ALBUMIN: 2.9 g/dL — AB (ref 3.5–5.0)
ALT: 19 U/L (ref 14–54)
AST: 16 U/L (ref 15–41)
Alkaline Phosphatase: 61 U/L (ref 38–126)
Anion gap: 8 (ref 5–15)
BUN: 16 mg/dL (ref 6–20)
CHLORIDE: 102 mmol/L (ref 101–111)
CO2: 29 mmol/L (ref 22–32)
CREATININE: 1.18 mg/dL — AB (ref 0.44–1.00)
Calcium: 8.7 mg/dL — ABNORMAL LOW (ref 8.9–10.3)
GFR calc Af Amer: 53 mL/min — ABNORMAL LOW (ref >60)
GFR calc non Af Amer: 46 mL/min — ABNORMAL LOW (ref >60)
Glucose, Bld: 159 mg/dL — ABNORMAL HIGH (ref 65–99)
POTASSIUM: 3.3 mmol/L — AB (ref 3.5–5.1)
SODIUM: 139 mmol/L (ref 135–145)
Total Bilirubin: 0.7 mg/dL (ref 0.3–1.2)
Total Protein: 6.9 g/dL (ref 6.5–8.1)

## 2016-12-31 LAB — LIPASE, BLOOD: LIPASE: 27 U/L (ref 11–51)

## 2016-12-31 LAB — CLOSTRIDIUM DIFFICILE BY PCR, REFLEXED: Toxigenic C. Difficile by PCR: POSITIVE — AB

## 2016-12-31 MED ORDER — WARFARIN - PHYSICIAN DOSING INPATIENT: Freq: Every day | Status: DC

## 2016-12-31 MED ORDER — WARFARIN SODIUM 4 MG PO TABS
4.0000 mg | ORAL_TABLET | Freq: Every day | ORAL | Status: DC
  Administered 2016-12-31: 4 mg via ORAL
  Filled 2016-12-31: qty 1

## 2016-12-31 MED ORDER — MAGNESIUM CHLORIDE 64 MG PO TBEC
1.0000 | DELAYED_RELEASE_TABLET | Freq: Once | ORAL | Status: DC
  Filled 2016-12-31: qty 1

## 2016-12-31 MED ORDER — SODIUM CHLORIDE 0.9 % IV BOLUS (SEPSIS)
250.0000 mL | Freq: Once | INTRAVENOUS | Status: AC
  Administered 2016-12-31: 250 mL via INTRAVENOUS

## 2016-12-31 MED ORDER — ENSURE ENLIVE PO LIQD
237.0000 mL | Freq: Two times a day (BID) | ORAL | Status: DC
  Administered 2016-12-31: 237 mL via ORAL

## 2016-12-31 MED ORDER — SODIUM CHLORIDE 0.9 % IV BOLUS (SEPSIS)
1000.0000 mL | Freq: Once | INTRAVENOUS | Status: AC
  Administered 2016-12-31: 1000 mL via INTRAVENOUS

## 2016-12-31 MED ORDER — SODIUM CHLORIDE 0.9 % IV SOLN: INTRAVENOUS | Status: DC

## 2016-12-31 MED ORDER — PRAVASTATIN SODIUM 40 MG PO TABS
80.0000 mg | ORAL_TABLET | Freq: Every day | ORAL | Status: DC
  Administered 2016-12-31: 80 mg via ORAL
  Filled 2016-12-31: qty 2

## 2016-12-31 MED ORDER — PANTOPRAZOLE SODIUM 40 MG PO TBEC
40.0000 mg | DELAYED_RELEASE_TABLET | Freq: Every day | ORAL | Status: DC
  Administered 2016-12-31: 40 mg via ORAL
  Filled 2016-12-31: qty 1

## 2016-12-31 MED ORDER — HYDROCORTISONE 2.5 % RE CREA
TOPICAL_CREAM | Freq: Two times a day (BID) | RECTAL | Status: DC
  Administered 2016-12-31: 13:00:00 via RECTAL
  Filled 2016-12-31: qty 28.35

## 2016-12-31 MED ORDER — POTASSIUM CHLORIDE CRYS ER 20 MEQ PO TBCR
60.0000 meq | EXTENDED_RELEASE_TABLET | Freq: Once | ORAL | Status: AC
  Administered 2016-12-31: 60 meq via ORAL
  Filled 2016-12-31: qty 3

## 2016-12-31 MED ORDER — MAGNESIUM SULFATE 2 GM/50ML IV SOLN
2.0000 g | Freq: Once | INTRAVENOUS | Status: AC
  Administered 2016-12-31: 2 g via INTRAVENOUS
  Filled 2016-12-31: qty 50

## 2016-12-31 MED ORDER — POTASSIUM CHLORIDE CRYS ER 20 MEQ PO TBCR
40.0000 meq | EXTENDED_RELEASE_TABLET | Freq: Once | ORAL | Status: AC
  Administered 2016-12-31: 40 meq via ORAL
  Filled 2016-12-31: qty 2

## 2016-12-31 MED ORDER — MOMETASONE FURO-FORMOTEROL FUM 200-5 MCG/ACT IN AERO
2.0000 | INHALATION_SPRAY | Freq: Two times a day (BID) | RESPIRATORY_TRACT | Status: DC
  Filled 2016-12-31: qty 8.8

## 2016-12-31 MED ORDER — VANCOMYCIN 50 MG/ML ORAL SOLUTION
125.0000 mg | Freq: Four times a day (QID) | ORAL | Status: DC
  Administered 2016-12-31 (×2): 125 mg via ORAL
  Filled 2016-12-31 (×4): qty 2.5

## 2016-12-31 MED ORDER — MONTELUKAST SODIUM 10 MG PO TABS: 10.0000 mg | ORAL_TABLET | Freq: Every day | ORAL | Status: DC

## 2016-12-31 NOTE — ED Provider Notes (Signed)
[facility] EMERGENCY DEPARTMENT Provider Note   CSN: [id number] Arrival date & time: 12/31/16  0348     History   Chief Complaint Chief Complaint  Patient presents with  . Abdominal Pain  . Diarrhea    HPI Caroline [name] is a 71 y.o. female.  The history is provided by the patient.  She comes in complaining of diarrhea for the last 5 days.  She has been having 4-5 loose to watery bowel movements a day with occasional streaks of blood.  She has had some mild, intermittent nausea.  She denies fever, chills, sweats.  She has had some abdominal cramping.  She treated her diarrhea with loperamide with only modest results.  She did have antibiotics about 6 weeks ago (clindamycin).  She denies any travel and denies any sick contacts.  She denies urinary urgency, frequency, tenesmus, dysuria.  Past Medical History:  Diagnosis Date  . Arthritis   . Asthma   . Bronchitis   . CHF (congestive heart failure) (HCC)   . DVT, lower extremity, recurrent (HCC)    Patient had unprovoked PE on 2002 and DVT in right lower extremety 2008.  [name] GERD (gastroesophageal reflux disease)   . History of kidney stones   . Hypertension   . PE (pulmonary embolism)    Patient had unprovoked PE on 2002  . PONV (postoperative nausea and vomiting)   . Vertigo     Patient Active Problem List   Diagnosis Date Noted  . Lipodermatosclerosis 10/11/2016  . Cellulitis 09/21/2016  . Morbid obesity with BMI of 45.0-49.9, adult (HCC) 05/02/2016  . Chronic combined systolic and diastolic CHF, NYHA class 1 (HCC) 09/21/2015  . History of exertional chest pain 08/26/2013  . DVT, lower extremity, recurrent (HCC)   . Vertigo 09/13/2011  . Healthcare maintenance 09/13/2011  . Allergic rhinitis 06/21/2006  . Hyperlipidemia 06/10/2006  . Essential hypertension 06/10/2006  . Moderate persistent asthma 06/10/2006    Past Surgical History:  Procedure Laterality Date  . CYSTOSCOPY W/ URETERAL STENT  PLACEMENT Right 09/16/2015   Procedure: CYSTOSCOPY WITH RETROGRADE PYELOGRAM/URETERAL STENT PLACEMENT;  Surgeon: Alfredo Martinez, MD;  Location: [facility] OR;  Service: Urology;  Laterality: Right;  . CYSTOSCOPY WITH RETROGRADE PYELOGRAM, URETEROSCOPY AND STENT PLACEMENT Right 12/11/2015   Procedure: RIGHT URETEROSCOPY/HOLMIUM LASER LITHOTRIPSY AND STONE REMOVAL removal and placement of double j stent;  Surgeon: Crist Fat, MD;  Location: [facility];  Service: Urology;  Laterality: Right;  . HOLMIUM LASER APPLICATION Right 12/11/2015   Procedure: HOLMIUM LASER APPLICATION;  Surgeon: Crist Fat, MD;  Location: [facility];  Service: Urology;  Laterality: Right;    OB History    No data available       Home Medications    Prior to Admission medications   Medication Sig Start Date End Date Taking? Authorizing Provider  acetaminophen (TYLENOL) 500 MG tablet Take 500 mg by mouth every 6 (six) hours as needed for mild pain or moderate pain. pain    [provider]  albuterol (PROAIR HFA) 108 (90 Base) MCG/ACT inhaler INHALE 2 PUFFS INTO THE LUNGS EVERY 6 (SIX) HOURS AS NEEDED FOR WHEEZING OR SHORTNESS OF BREATH. 03/08/16   Rivet, Iris Pert, MD  budesonide-formoterol (SYMBICORT) 160-4.5 MCG/ACT inhaler Inhale 2 puffs into the lungs 2 (two) times daily. 03/08/16   Rivet, Iris Pert, MD  calcium citrate-vitamin D (CITRACAL+D) 315-200 MG-UNIT tablet Take 2 tablets daily by mouth. 11/14/16   [name], MD  carvedilol (COREG) 12.5  MG tablet Take 1 tablet (12.5 mg total) by mouth 2 (two) times daily. 11/29/16 02/27/17  Molt, Bethany, DO  fluticasone (FLONASE) 50 MCG/ACT nasal spray PLACE 2 SPRAYS DAILY INTO BOTH NOSTRILS. 12/21/16   Molt, Bethany, DO  furosemide (LASIX) 40 MG tablet Take 1 tablet (40 mg total) by mouth every other day. 11/29/16   Molt, Bethany, DO  hydrocortisone (PROCTOSOL HC) 2.5 % rectal cream PLACE 1 APPLICATION RECTALLY 2 TIMES DAILY. 06/21/16   Rivet, Iris Pertarly J, MD  losartan  (COZAAR) 100 MG tablet Take 1 tablet (100 mg total) by mouth daily. 11/29/16   Molt, Bethany, DO  meclizine (ANTIVERT) 25 MG tablet Take 1 tablet (25 mg total) 3 (three) times daily as needed by mouth. 11/23/16   Molt, Bethany, DO  montelukast (SINGULAIR) 10 MG tablet Take 1 tablet (10 mg total) at bedtime by mouth. 11/14/16   Doneen PoissonKlima, Lawrence, MD  pantoprazole (PROTONIX) 40 MG tablet Take 1 tablet (40 mg total) by mouth daily. 11/29/16   Molt, Bethany, DO  pravastatin (PRAVACHOL) 80 MG tablet TAKE 1 TABLET (80 MG TOTAL) BY MOUTH DAILY. 11/29/16   Molt, Bethany, DO  RESTASIS MULTIDOSE 0.05 % ophthalmic emulsion Use once daily 08/19/16   [provider]  warfarin (COUMADIN) 4 MG tablet TAKE 1 TABLET BY MOUTH DAILY--EXCEPT ON WEDNESDAYS--TAKE ONLY 1/2 TABLET ON WEDNESDAYS. 09/06/16   Molt, Bethany, DO    Family History Family History  Problem Relation Age of Onset  . Cancer Mother   . Hypertension Sister   . Hypertension Brother   . Diabetes Brother   . Hypertension Sister   . Colon cancer Other   . Heart Problems Other 34       sister's child, open heart surgery    Social History Social History   Tobacco Use  . Smoking status: Former Smoker    Types: Cigarettes    Last attempt to quit: 09/18/1988    Years since quitting: 28.3  . Smokeless tobacco: Never Used  Substance Use Topics  . Alcohol use: No  . Drug use: No     Allergies   Penicillins; Cabbage; Keflex [cephalexin]; Shellfish allergy; Tomato; and Latex   Review of Systems Review of Systems  All other systems reviewed and are negative.    Physical Exam Updated Vital Signs BP (!) 111/52   Pulse 88   Temp 98.3 F (36.8 C) (Oral)   Resp 15   Ht 5' 3.5" (1.613 m)   Wt 123.4 kg (272 lb)   SpO2 96%   BMI 47.43 kg/m   Physical Exam  Nursing note and vitals reviewed.  71 year old female, resting comfortably and in no acute distress. Vital signs are normal. Oxygen saturation is 96%, which is normal. Head is  normocephalic and atraumatic. PERRLA, EOMI. Oropharynx is clear. Neck is nontender and supple without adenopathy or JVD. Back is nontender and there is no CVA tenderness. Lungs are clear without rales, wheezes, or rhonchi. Chest is nontender. Heart has regular rate and rhythm without murmur. Abdomen is soft, flat, nontender without masses or hepatosplenomegaly and peristalsis is hypoactive. Extremities have no cyanosis or edema, full range of motion is present. Skin is warm and dry without rash. Neurologic: Mental status is normal, cranial nerves are intact, there are no motor or sensory deficits.  ED Treatments / Results  Labs (all labs ordered are listed, but only abnormal results are displayed) Labs Reviewed  COMPREHENSIVE METABOLIC PANEL - Abnormal; Notable for the following components:  Result Value   Potassium 3.3 (*)    Glucose, Bld 159 (*)    Creatinine, Ser 1.18 (*)    Calcium 8.7 (*)    Albumin 2.9 (*)    GFR calc non Af Amer 46 (*)    GFR calc Af Amer 53 (*)    All other components within normal limits  CBC - Abnormal; Notable for the following components:   WBC 11.0 (*)    Hemoglobin 10.6 (*)    HCT 34.2 (*)    MCH 24.9 (*)    All other components within normal limits  URINALYSIS, ROUTINE W REFLEX MICROSCOPIC - Abnormal; Notable for the following components:   Color, Urine AMBER (*)    APPearance HAZY (*)    Hgb urine dipstick SMALL (*)    Protein, ur 30 (*)    Leukocytes, UA MODERATE (*)    Bacteria, UA RARE (*)    Squamous Epithelial / LPF 0-5 (*)    All other components within normal limits  PROTIME-INR - Abnormal; Notable for the following components:   Prothrombin Time 23.9 (*)    All other components within normal limits  GASTROINTESTINAL PANEL BY PCR, STOOL (REPLACES STOOL CULTURE)  C DIFFICILE QUICK SCREEN W PCR REFLEX  LIPASE, BLOOD   Procedures Procedures (including critical care time)  Medications Ordered in ED Medications  sodium  chloride 0.9 % bolus 1,000 mL (0 mLs Intravenous Stopped 12/31/16 0713)  potassium chloride SA (K-DUR,KLOR-CON) CR tablet 40 mEq (40 mEq Oral Given 12/31/16 0608)     Initial Impression / Assessment and Plan / ED Course  I have reviewed the triage vital signs and the nursing notes.  Pertinent labs & imaging results that were available during my care of the patient were reviewed by me and considered in my medical decision making (see chart for details).  Diarrhea of uncertain cause, not responding to conservative measures.  Consider possibility of Clostridium difficile given recent antibiotic administration.  No red flags to suggest more serious illness.  Laboratory workup is reviewed, and there is a modest increase in creatinine from 0.91 on September 8, to 1.18 today.  We will give IV fluids and try to obtain a stool sample for analysis.  Patient is not given a stool sample to this point.  However, after IV hydration, orthostatic vital signs showed significant drop in blood pressure going from sitting to standing (patient unable to tolerate laying flat).  She will need to be admitted for additional hydration.  Still need to obtain stool sample to evaluate for possible Clostridium difficile infection.  Case is discussed with Dr. Samuella Cota of internal medicine teaching service who agrees to admit the patient.  Final Clinical Impressions(s) / ED Diagnoses   Final diagnoses:  Diarrhea of presumed infectious origin  Acute kidney injury (nontraumatic) (HCC)  Orthostatic hypotension  Hypokalemia  Anticoagulated on warfarin    ED Discharge Orders    None       Dione Booze, MD 12/31/16 [phone]

## 2016-12-31 NOTE — Progress Notes (Signed)
Pt to resume hold bp meds and lasix if diarrhea stops per provder Lacroce.

## 2016-12-31 NOTE — Progress Notes (Signed)
Pt given discharge instructions and all questions were answered. IV site was removed and prescriptions were called into [facility] for pt by MD.

## 2016-12-31 NOTE — H&P (Signed)
Date: 12/31/2016               Patient Name:  Caroline [name] MRN: [id number]  DOB: 01/06/1946 Age / Sex: 71 y.o., female   PCP: Noemi ChapelMolt, Bethany, DO         Medical Service: Internal Medicine Teaching Service         Attending Physician: Dr. Inez CatalinaMullen, Emily B, MD    First Contact: Dr. Minda MeoLaCroce Pager: [phone]  Second Contact: Dr. Samuella CotaSvalina Pager: [phone]       After Hours (After 5p/  First Contact Pager: [phone]  weekends / holidays): Second Contact Pager: [phone]   Chief Complaint: diarrhea  History of Present Illness:  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 She states her diarrhea started about 6 days ago. Her stools have been loose and occurring 3-4 times per day with some mucous and streaks of blood. Her bowel movements are also associated with lower abdominal cramping, which occurs prior to having a BM. Patient states she typically has bowel movements 3 times per week. She denies vomiting or fever. Does endorse mild chills and body aches, as well as nausea. She has had a decreased appetite, but has been able to drink 2-4 bottles of water per day. Denies chest pain, shortness of breath, or dysuria. Patient continued to take all medications including lasix during her acute illness.   Patient had two courses of clindamycin in September and November 2018 for recurrent LE cellulitis. Last dose of clindamycin was November. No other antibiotic use since that time.  Denies diarrhea with past abx use.    ED Course: Patient has had one bowel movement since arrival to ED @ 730AM. She states it was loose with mucous, no blood.  Vitals: BP 111/52   Pulse 88   Temp 98.3 F (36.8 C) (Oral)   Resp 15  SpO2 96%    -Positive orthostatics: Sitting 134/75, Standing 110/71 Labs: Mild elevated WBC 11.0, Hgb 10.6 (at  patients baseline), elevated creatinine 1.18 (baseline 0.9), hypokalemia 3.3, LFTs WNL, INR 2.16, UA negative  Meds: 1 L of NS, Kdur    Meds:  No outpatient medications have been marked as taking for the 12/31/16 encounter [facility](Hospital Encounter).     Allergies: Allergies as of 12/31/2016 - Review Complete 12/26/2016  Allergen Reaction Noted  . Penicillins Anaphylaxis, Hives, Swelling, and Rash 06/10/2006  . Cabbage Itching 11/24/2015  . Keflex [cephalexin] Hives 09/26/2016  . Shellfish allergy Swelling 09/13/2011  . Tomato Swelling 09/13/2011  . Latex Itching and Rash 11/24/2015   Past Medical History:  Diagnosis Date  . Arthritis   . Asthma   . Bronchitis   . CHF (congestive heart failure) (HCC)   . DVT, lower extremity, recurrent (HCC)    Patient had unprovoked PE on 2002 and DVT in right lower extremety 2008.  [name]. GERD (gastroesophageal reflux disease)   . History of kidney stones   . Hypertension   . PE (pulmonary embolism)    Patient had unprovoked PE on 2002  . PONV (postoperative nausea and vomiting)   . Vertigo     Family History:  Family History  Problem Relation Age of Onset  . Cancer Mother   . Hypertension Sister   . Hypertension Brother   . Diabetes Brother   . Hypertension Sister   .  Colon cancer Other   . Heart Problems Other 34       sister's child, open heart surgery    Social History:  Social History   Tobacco Use  . Smoking status: Former Smoker    Types: Cigarettes    Last attempt to quit: 09/18/1988    Years since quitting: 28.3  . Smokeless tobacco: Never Used  Substance Use Topics  . Alcohol use: No  . Drug use: No    Review of Systems: A complete ROS was negative except as per HPI.   Physical Exam: Blood pressure 125/71, pulse 85, temperature 98.3 F (36.8 C), temperature source Oral, resp. rate 18, height 5' 3.5" (1.613 m), weight 272 lb (123.4 kg), SpO2 96 %. Physical Exam  Constitutional: She is oriented to person, place, and  time. She appears well-developed and well-nourished. No distress.  HENT:  Head: Normocephalic and atraumatic.  Mouth/Throat: Oropharynx is clear and moist.  Eyes: Conjunctivae are normal. No scleral icterus.  Neck: Normal range of motion. Neck supple.  Cardiovascular: Normal rate, regular rhythm and normal heart sounds.  Pulmonary/Chest: Effort normal and breath sounds normal. No respiratory distress. She has no wheezes.  Abdominal: Soft. Bowel sounds are normal. She exhibits distension. There is no tenderness.  Musculoskeletal: Normal range of motion. She exhibits no edema.  Lymphadenopathy:    She has no cervical adenopathy.  Neurological: She is alert and oriented to person, place, and time.  Skin: Skin is warm and dry.  Psychiatric: She has a normal mood and affect.    EKG: none to review  CXR: none to review  Assessment & Plan by Problem: Active Problems:   Diarrhea  Diarrhea Patient afebrile with mildly elevated WBC. High risk for C. Diff in the setting of clindamycin use, but low suspicion. Could also be viral gastroenteritis. No red flag symptoms. -C.diff PCR pending  -GI panel pending  -IVF resuscitation--additional 250 cc L bolus -Will repeat orthostatics in afternoon -Patient may be able to be d/ced later this afternoon.  -CBC and BMET in AM if patient not discharged   Elevated Creatine  Mild dehydration with creatinine 1.18 on admission. Patient's baseline 0.9. Most likely pre-renal in there setting of diarrhea and continued lasix use. -Received 1 Liter bolus in ED, will give additional 250 cc L bolus -BMET in AM if patient not discharged  Chronic Systolic and Diastolic HF Most recent ECHO in September 2017.  Estimated ejection fraction was in the range of 45% to 50%  with concomitant abnormal relaxation and increased filling pressure (grade 2 diastolic dysfunction). Patient takes Lasix 40 mg every other day. Patient euvolemic on exam with no peripheral edema and  lungs CTAB. -Will likely hold   Recurrent DVTs on Anticoagulation Therapeutic, INR 2.16. Hemoglobin stable.  -Continue warfarin   Hypertension BP soft with orthostatic hypotension after 1 liter bolus.  -Will repeat orthostatics -Holding BP meds and lasix.   Asthma  Lungs clear to auscultation bilaterally. Patient in no respiratory distress, no wheezing on exam. -Continue symbicort and singulair   Dispo: Admit patient to Observation with expected length of stay less than 2 midnights.  Signed: Toney Rakes, MD 12/31/2016, 8:01 AM  Pager: [phone]

## 2016-12-31 NOTE — Progress Notes (Signed)
Pt arrived to unit via stretcher. Pt in no apparent distress. VS were taken and WDL. Pt was oriented to room and call bell system.

## 2016-12-31 NOTE — ED Triage Notes (Signed)
The pt has had diarrhea stools tonight with blood specks in it  She is on coumadin.  She has abd cramps also  Hx of pes and dvts

## 2017-01-01 NOTE — Discharge Summary (Signed)
Name: Caroline [name] MRN: [id number] DOB: November 09, 1945 71 y.o. PCP: Caroline Chapel, DO  Date of Admission: 12/31/2016  4:21 AM Date of Discharge: 12/31/2016 Attending Physician: No att. providers found  Discharge Diagnosis: 1. Clostridium Difficile Colitis Active Problems:   Diarrhea   Acute kidney injury (nontraumatic) (HCC)   Anticoagulated on warfarin   Orthostatic hypotension   Clostridium difficile colitis   Discharge Medications: Allergies as of 12/31/2016      Reactions   Penicillins Anaphylaxis, Hives, Swelling, Rash   Has patient had a PCN reaction causing immediate rash, facial/tongue/throat swelling, SOB or lightheadedness with hypotension: Yes Has patient had a PCN reaction causing severe rash involving mucus membranes or skin necrosis: Yes Has patient had a PCN reaction that required hospitalization Yes Has patient had a PCN reaction occurring within the last 10 years: No If all of the above answers are "NO", then may proceed with Cephalosporin use.   Cabbage Itching   Keflex [cephalexin] Hives   And feeling of throat tightness   Shellfish Allergy Swelling   Tomato Swelling   Latex Itching, Rash      Medication List    TAKE these medications   acetaminophen 500 MG tablet Commonly known as:  TYLENOL Take 500 mg by mouth every 6 (six) hours as needed for mild pain or moderate pain. pain   albuterol 108 (90 Base) MCG/ACT inhaler Commonly known as:  PROAIR HFA INHALE 2 PUFFS INTO THE LUNGS EVERY 6 (SIX) HOURS AS NEEDED FOR WHEEZING OR SHORTNESS OF BREATH.   budesonide-formoterol 160-4.5 MCG/ACT inhaler Commonly known as:  SYMBICORT Inhale 2 puffs into the lungs 2 (two) times daily.   calcium citrate-vitamin D 315-200 MG-UNIT tablet Commonly known as:  CITRACAL+D Take 2 tablets daily by mouth.   carvedilol 12.5 MG tablet Commonly known as:  COREG Take 1 tablet (12.5 mg total) by mouth 2 (two) times daily.   fluticasone 50 MCG/ACT nasal spray Commonly  known as:  FLONASE PLACE 2 SPRAYS DAILY INTO BOTH NOSTRILS.   furosemide 40 MG tablet Commonly known as:  LASIX Take 1 tablet (40 mg total) by mouth every other day. Notes to patient:  RESUME TOMORROW IF DIARRHEA STOPS   hydrocortisone 2.5 % rectal cream Commonly known as:  PROCTOSOL HC PLACE 1 APPLICATION RECTALLY 2 TIMES DAILY.   losartan 100 MG tablet Commonly known as:  COZAAR Take 1 tablet (100 mg total) by mouth daily.   meclizine 25 MG tablet Commonly known as:  ANTIVERT Take 1 tablet (25 mg total) 3 (three) times daily as needed by mouth.   montelukast 10 MG tablet Commonly known as:  SINGULAIR Take 1 tablet (10 mg total) at bedtime by mouth.   pantoprazole 40 MG tablet Commonly known as:  PROTONIX Take 1 tablet (40 mg total) by mouth daily.   pravastatin 80 MG tablet Commonly known as:  PRAVACHOL TAKE 1 TABLET (80 MG TOTAL) BY MOUTH DAILY.   RESTASIS MULTIDOSE 0.05 % ophthalmic emulsion Generic drug:  cycloSPORINE Place 1 drop into both eyes 2 (two) times daily. Use once daily   warfarin 4 MG tablet Commonly known as:  COUMADIN Take as directed. If you are unsure how to take this medication, talk to your nurse or doctor. Original instructions:  TAKE 1 TABLET BY MOUTH DAILY--EXCEPT ON WEDNESDAYS--TAKE ONLY 1/2 TABLET ON WEDNESDAYS. What changed:  See the new instructions.       Disposition and follow-up:   Caroline [name] was discharged from [facility]  Hospital in Stable condition.  At the hospital follow up visit please address:  1.  Hospital problems:   Clostridium Difficile Colitis -Patient discharged on PO Vancomycin 125 mg QID for 10 days -Adherent with antibiotic regimen? -Recurrent diarrhea?  -Volume status?  Repeat BMET, CBC, Blood pressure - patient had positive orthostatics on admission?  Electrolyte Abnormalities  Hypokalemia, Hypomagnesia  -Potassium 3.3 > 3.2, replaced with 100 meq total of Kdur -Magnesium 1.6, replaced with 2  g IV magnesium -Repeat BMET  2.  Labs / imaging needed at time of follow-up: BMET, CBC   3.  Pending labs/ test needing follow-up: GI pathogen panel resulted negative day after discharge    Follow-up Appointments:   Hospital Course by problem list: Active Problems:   Diarrhea   Acute kidney injury (nontraumatic) (HCC)   Anticoagulated on warfarin   Orthostatic hypotension   Clostridium difficile colitis   1. Clostridium Difficile Colitis Caroline [name] was admitted to [facility] and the Internal Medicine Teaching Service for 6 days of diarrhea that was associated orthostatic hypotension and elevated creatinine/mild dehydration. In the ED, the patient was afebrile with low blood pressure of 111/52, and subsequent positive orthostatics. She had a mildly elevated WBC 11.0, an elevated creatinine of 1.18 (baseline 0.9), and hypokalemia 3.3. Her LFTs were within normal limits. The patient was given a total of 1.25 liters of NS with significant improvement in symptoms and labs. She had 1 non-bloody, bowel movement with mucus while in the ED, but no other episodes of diarrheawhile hospitalized. The patient was high risk for C. Diff with a history of completing several courses of clindamycin for recurrent cellulitis completed over the past few months, most recent in November. Reflex C. Diff PCR resulted positive, and she was started on oral vancomycin 125 mg QID. GI pathogen panel was negative.   Repeat BMET later that afternoon showed improvement in the patient's creatinine after received IV fluids. Repeat orthostatics were negative and she was able to tolerate a regular diet, as well as ambulate without difficulty. Her infection was not severe and she was stable for same day discharge. Vancomycin 125 mg PO QID tablets were called into her pharmacy to ensure affordability. The patient was instructed to pick up her antibiotics as soon as possible.   Electrolyte Abnormalities  Hypokalemia,  Hypomagnesia  Patient with hypokalemia and hypomagnesia most likely due to diarrhea and continued lasix use during acute diarrheal illness. Her potassium was 3.3 on admission, and she was given 40 meq of Kdur in the ED. Repeat BMET did not show improvement in her potassium, which was 3.2. She was given an additional 60 meq of Kdur. The patient also had hypomagnesia, with serum magnesium at 1.6. Her magnesium was replaced with 2 grams of IV mag.   Discharge Vitals:   BP 123/62 (BP Location: Left Arm)   Pulse 86   Temp 98.3 F (36.8 C) (Oral)   Resp (!) 21   Ht 5' 3.5" (1.613 m)   Wt 270 lb 8.1 oz (122.7 kg)   SpO2 97%   BMI 47.17 kg/m   Pertinent Labs, Studies, and Procedures:  CBC Latest Ref Rng & Units 12/31/2016 09/17/2016 11/27/2015  WBC 4.0 - 10.5 K/uL 11.0(H) 6.4 7.3  Hemoglobin 12.0 - 15.0 g/dL 10.6(L) 10.7(L) 9.4(L)  Hematocrit 36.0 - 46.0 % 34.2(L) 35.7(L) 30.5(L)  Platelets 150 - 400 K/uL 239 201 307   BMP Latest Ref Rng & Units 12/31/2016 12/31/2016 09/17/2016  Glucose 65 - 99 mg/dL  157(H) 159(H) 110(H)  BUN 6 - 20 mg/dL 11 16 13   Creatinine 0.44 - 1.00 mg/dL 9.520.96 8.41(L1.18(H) 2.440.91  BUN/Creat Ratio 12 - 28 - - -  Sodium 135 - 145 mmol/L 140 139 142  Potassium 3.5 - 5.1 mmol/L 3.2(L) 3.3(L) 4.0  Chloride 101 - 111 mmol/L 104 102 106  CO2 22 - 32 mmol/L 28 29 29   Calcium 8.9 - 10.3 mg/dL 0.1(U8.5(L) 2.7(O8.7(L) 9.4                       Ref Range & Units C Diff antigen NEGATIVE POSITIVE Abnormal    C Diff toxin NEGATIVE NEGATIVE    Toxigenic C. Difficile by PCR POSITIVE      Discharge Instructions: Discharge Instructions    Call MD for:  extreme fatigue   Complete by:  As directed    Call MD for:  hives   Complete by:  As directed    Call MD for:  persistant dizziness or light-headedness   Complete by:  As directed    Call MD for:  persistant nausea and vomiting   Complete by:  As directed    Call MD for:  severe uncontrolled pain   Complete by:  As directed    Call MD for:   temperature >100.4   Complete by:  As directed    Diet - low sodium heart healthy   Complete by:  As directed    Discharge instructions   Complete by:  As directed    Please discharge after IV magnesium is completed.   Ms. Dwan Boltvans,   [facility] for dehydration caused by your diarrhea. On testing you were found to have an infection called Clodstridium Difficile. This is an infection of your intestines that is typically caused with prior antibitoic use, especially Clindamycin, which had taken multiple times in the past few months. Your infection is mild, but we are going to treat you with an antibiotic called Vancomycin. Take 125 mg of Vancomycin four times daily for a total of 10 days. I have called in the prescription to your pharmacy and it should be ready for pick up.   Your dehydration has improved greatly improved after you received some IV fluids.   Please do not take your Lasix tomorrow if you continue to have diarrhea. You can resume all other medications that you were on previously.  You will need to be seen in clinic within 1-2 weeks of discharge of the hospital. The [facility] is closed over the holidays, but I will send a message to the front desk to call you to schedule an appointment for hospital follow up and repeat labs. If you do not hear from them please call and schedule a hospital follow up appointment.   Increase activity slowly   Complete by:  As directed       Signed: Toney RakesLacroce, Samantha J, MD 01/01/2017, 12:14 PM   Pager: [phone]

## 2017-01-10 DIAGNOSIS — E119 Type 2 diabetes mellitus without complications: Secondary | ICD-10-CM

## 2017-01-10 HISTORY — DX: Type 2 diabetes mellitus without complications: E11.9

## 2017-01-11 ENCOUNTER — Encounter (HOSPITAL_COMMUNITY): Payer: Self-pay | Admitting: *Deleted

## 2017-01-11 ENCOUNTER — Other Ambulatory Visit: Payer: Self-pay

## 2017-01-11 ENCOUNTER — Emergency Department (HOSPITAL_COMMUNITY): Payer: Medicare Other

## 2017-01-11 ENCOUNTER — Emergency Department (HOSPITAL_COMMUNITY)
Admission: EM | Admit: 2017-01-11 | Discharge: 2017-01-11 | Disposition: A | Discharge status: Home / Self Care | Payer: Medicare Other | Attending: Emergency Medicine | Admitting: Emergency Medicine

## 2017-01-11 DIAGNOSIS — R05 Cough: Secondary | ICD-10-CM | POA: Insufficient documentation

## 2017-01-11 DIAGNOSIS — Z7901 Long term (current) use of anticoagulants: Secondary | ICD-10-CM | POA: Diagnosis not present

## 2017-01-11 DIAGNOSIS — J029 Acute pharyngitis, unspecified: Secondary | ICD-10-CM | POA: Insufficient documentation

## 2017-01-11 DIAGNOSIS — B9789 Other viral agents as the cause of diseases classified elsewhere: Secondary | ICD-10-CM | POA: Diagnosis not present

## 2017-01-11 DIAGNOSIS — M7918 Myalgia, other site: Secondary | ICD-10-CM | POA: Insufficient documentation

## 2017-01-11 DIAGNOSIS — I509 Heart failure, unspecified: Secondary | ICD-10-CM | POA: Diagnosis not present

## 2017-01-11 DIAGNOSIS — Z79899 Other long term (current) drug therapy: Secondary | ICD-10-CM | POA: Diagnosis not present

## 2017-01-11 DIAGNOSIS — I110 Hypertensive heart disease with heart failure: Secondary | ICD-10-CM | POA: Diagnosis not present

## 2017-01-11 DIAGNOSIS — R51 Headache: Secondary | ICD-10-CM | POA: Diagnosis not present

## 2017-01-11 DIAGNOSIS — R509 Fever, unspecified: Secondary | ICD-10-CM | POA: Diagnosis present

## 2017-01-11 DIAGNOSIS — J4551 Severe persistent asthma with (acute) exacerbation: Secondary | ICD-10-CM

## 2017-01-11 DIAGNOSIS — J069 Acute upper respiratory infection, unspecified: Secondary | ICD-10-CM | POA: Diagnosis not present

## 2017-01-11 DIAGNOSIS — Z87891 Personal history of nicotine dependence: Secondary | ICD-10-CM | POA: Insufficient documentation

## 2017-01-11 LAB — CBC WITH DIFFERENTIAL/PLATELET
BASOS ABS: 0 10*3/uL (ref 0.0–0.1)
Basophils Relative: 0 %
EOS ABS: 0.2 10*3/uL (ref 0.0–0.7)
EOS PCT: 2 %
HCT: 33.5 % — ABNORMAL LOW (ref 36.0–46.0)
HEMOGLOBIN: 10.4 g/dL — AB (ref 12.0–15.0)
LYMPHS ABS: 3.2 10*3/uL (ref 0.7–4.0)
Lymphocytes Relative: 33 %
MCH: 25.4 pg — AB (ref 26.0–34.0)
MCHC: 31 g/dL (ref 30.0–36.0)
MCV: 81.7 fL (ref 78.0–100.0)
Monocytes Absolute: 0.6 10*3/uL (ref 0.1–1.0)
Monocytes Relative: 6 %
NEUTROS PCT: 59 %
Neutro Abs: 5.7 10*3/uL (ref 1.7–7.7)
PLATELETS: 201 10*3/uL (ref 150–400)
RBC: 4.1 MIL/uL (ref 3.87–5.11)
RDW: 15.5 % (ref 11.5–15.5)
WBC: 9.6 10*3/uL (ref 4.0–10.5)

## 2017-01-11 LAB — BASIC METABOLIC PANEL
Anion gap: 9 (ref 5–15)
BUN: 7 mg/dL (ref 6–20)
CHLORIDE: 104 mmol/L (ref 101–111)
CO2: 28 mmol/L (ref 22–32)
Calcium: 8.8 mg/dL — ABNORMAL LOW (ref 8.9–10.3)
Creatinine, Ser: 0.79 mg/dL (ref 0.44–1.00)
GFR calc Af Amer: >60 mL/min (ref >60)
Glucose, Bld: 128 mg/dL — ABNORMAL HIGH (ref 65–99)
POTASSIUM: 3.1 mmol/L — AB (ref 3.5–5.1)
SODIUM: 141 mmol/L (ref 135–145)

## 2017-01-11 LAB — PROTIME-INR
INR: 2.32
PROTHROMBIN TIME: 25.3 s — AB (ref 11.4–15.2)

## 2017-01-11 LAB — INFLUENZA PANEL BY PCR (TYPE A & B)
INFLAPCR: NEGATIVE
INFLBPCR: NEGATIVE

## 2017-01-11 LAB — I-STAT CG4 LACTIC ACID, ED: Lactic Acid, Venous: 1.12 mmol/L (ref 0.5–1.9)

## 2017-01-11 MED ORDER — ALBUTEROL SULFATE (2.5 MG/3ML) 0.083% IN NEBU
5.0000 mg | INHALATION_SOLUTION | Freq: Once | RESPIRATORY_TRACT | Status: AC
  Administered 2017-01-11: 5 mg via RESPIRATORY_TRACT
  Filled 2017-01-11: qty 6

## 2017-01-11 MED ORDER — ACETAMINOPHEN 500 MG PO TABS
1000.0000 mg | ORAL_TABLET | Freq: Once | ORAL | Status: AC
  Administered 2017-01-11: 1000 mg via ORAL
  Filled 2017-01-11: qty 2

## 2017-01-11 MED ORDER — PREDNISONE 20 MG PO TABS: 40.0000 mg | ORAL_TABLET | Freq: Every day | ORAL | 0 refills | Status: DC

## 2017-01-11 MED ORDER — ALBUTEROL SULFATE HFA 108 (90 BASE) MCG/ACT IN AERS
2.0000 | INHALATION_SPRAY | RESPIRATORY_TRACT | Status: DC | PRN
  Administered 2017-01-11: 2 via RESPIRATORY_TRACT
  Filled 2017-01-11: qty 6.7

## 2017-01-11 MED ORDER — PREDNISONE 20 MG PO TABS
60.0000 mg | ORAL_TABLET | Freq: Once | ORAL | Status: AC
  Administered 2017-01-11: 60 mg via ORAL
  Filled 2017-01-11: qty 3

## 2017-01-11 MED ORDER — IPRATROPIUM BROMIDE 0.02 % IN SOLN
0.5000 mg | Freq: Once | RESPIRATORY_TRACT | Status: AC
  Administered 2017-01-11: 0.5 mg via RESPIRATORY_TRACT
  Filled 2017-01-11: qty 2.5

## 2017-01-11 NOTE — ED Notes (Signed)
ED Provider at bedside. 

## 2017-01-11 NOTE — ED Provider Notes (Signed)
TIME SEEN: 6:02 AM  CHIEF COMPLAINT: Fever, sore throat, cough, body aches  HPI: Patient is a 72 year old female with history of hypertension, PE and DVT on Coumadin, asthma, CHF who presents to the emergency department with 2 days of subjective fevers, sore throat, productive cough with brown sputum, wheezing and body aches.  Reports her grandchild has similar symptoms.  She has had an influenza vaccination this year.  No recent travel.  No chest pain or shortness of breath.  Has mild headache but no neck pain or neck stiffness.  No vomiting or diarrhea.  No abdominal pain.  ROS: See HPI Constitutional:  fever  Eyes: no drainage  ENT: no runny nose   Cardiovascular:  no chest pain  Resp: no SOB  GI: no vomiting GU: no dysuria Integumentary: no rash  Allergy: no hives  Musculoskeletal: no leg swelling  Neurological: no slurred speech ROS otherwise negative  PAST MEDICAL HISTORY/PAST SURGICAL HISTORY:  Past Medical History:  Diagnosis Date  . Arthritis   . Asthma   . Bronchitis   . CHF (congestive heart failure) (HCC)   . DVT, lower extremity, recurrent (HCC)    Patient had unprovoked PE on 2002 and DVT in right lower extremety 2008.  [name] GERD (gastroesophageal reflux disease)   . History of kidney stones   . Hypertension   . PE (pulmonary embolism)    Patient had unprovoked PE on 2002  . PONV (postoperative nausea and vomiting)   . Vertigo     MEDICATIONS:  Prior to Admission medications   Medication Sig Start Date End Date Taking? Authorizing Provider  acetaminophen (TYLENOL) 500 MG tablet Take 500 mg by mouth every 6 (six) hours as needed for mild pain or moderate pain. pain    [provider]  albuterol (PROAIR HFA) 108 (90 Base) MCG/ACT inhaler INHALE 2 PUFFS INTO THE LUNGS EVERY 6 (SIX) HOURS AS NEEDED FOR WHEEZING OR SHORTNESS OF BREATH. 03/08/16   Rivet, Iris Pert, MD  budesonide-formoterol (SYMBICORT) 160-4.5 MCG/ACT inhaler Inhale 2 puffs into the lungs 2 (two)  times daily. 03/08/16   Rivet, Iris Pert, MD  calcium citrate-vitamin D (CITRACAL+D) 315-200 MG-UNIT tablet Take 2 tablets daily by mouth. 11/14/16   [name], MD  carvedilol (COREG) 12.5 MG tablet Take 1 tablet (12.5 mg total) by mouth 2 (two) times daily. 11/29/16 02/27/17  Molt, Bethany, DO  fluticasone (FLONASE) 50 MCG/ACT nasal spray PLACE 2 SPRAYS DAILY INTO BOTH NOSTRILS. 12/21/16   Molt, Bethany, DO  furosemide (LASIX) 40 MG tablet Take 1 tablet (40 mg total) by mouth every other day. 11/29/16   Molt, Bethany, DO  hydrocortisone (PROCTOSOL HC) 2.5 % rectal cream PLACE 1 APPLICATION RECTALLY 2 TIMES DAILY. 06/21/16   Rivet, Iris Pert, MD  losartan (COZAAR) 100 MG tablet Take 1 tablet (100 mg total) by mouth daily. 11/29/16   Molt, Bethany, DO  meclizine (ANTIVERT) 25 MG tablet Take 1 tablet (25 mg total) 3 (three) times daily as needed by mouth. 11/23/16   Molt, Bethany, DO  montelukast (SINGULAIR) 10 MG tablet Take 1 tablet (10 mg total) at bedtime by mouth. 11/14/16   [name], MD  pantoprazole (PROTONIX) 40 MG tablet Take 1 tablet (40 mg total) by mouth daily. 11/29/16   Molt, Bethany, DO  pravastatin (PRAVACHOL) 80 MG tablet TAKE 1 TABLET (80 MG TOTAL) BY MOUTH DAILY. 11/29/16   Molt, Bethany, DO  RESTASIS MULTIDOSE 0.05 % ophthalmic emulsion Place 1 drop into both eyes 2 (two) times  daily. Use once daily 08/19/16   [provider]  warfarin (COUMADIN) 4 MG tablet TAKE 1 TABLET BY MOUTH DAILY--EXCEPT ON WEDNESDAYS--TAKE ONLY 1/2 TABLET ON WEDNESDAYS. Patient taking differently: 2mg  by mouth once daily on Wednesdays, 4mg  by mouth once daily all other days of week 09/06/16   Molt, Bethany, DO    ALLERGIES:  Allergies  Allergen Reactions  . Penicillins Anaphylaxis, Hives, Swelling and Rash    Has patient had a PCN reaction causing immediate rash, facial/tongue/throat swelling, SOB or lightheadedness with hypotension: Yes Has patient had a PCN reaction causing severe rash  involving mucus membranes or skin necrosis: Yes Has patient had a PCN reaction that required hospitalization Yes Has patient had a PCN reaction occurring within the last 10 years: No If all of the above answers are "NO", then may proceed with Cephalosporin use.   . Cabbage Itching  . Keflex [Cephalexin] Hives    And feeling of throat tightness  . Shellfish Allergy Swelling  . Tomato Swelling  . Latex Itching and Rash    SOCIAL HISTORY:  Social History   Tobacco Use  . Smoking status: Former Smoker    Types: Cigarettes    Last attempt to quit: 09/18/1988    Years since quitting: 28.3  . Smokeless tobacco: Never Used  Substance Use Topics  . Alcohol use: No    FAMILY HISTORY: Family History  Problem Relation Age of Onset  . Cancer Mother   . Hypertension Sister   . Hypertension Brother   . Diabetes Brother   . Hypertension Sister   . Colon cancer Other   . Heart Problems Other 34       sister's child, open heart surgery    EXAM: BP 139/77 (BP Location: Left Arm)   Pulse 90   Temp 99.5 F (37.5 C) (Oral)   Resp 17   Ht 5\' 3"  (1.6 m)   Wt 130.2 kg (287 lb)   SpO2 96%   BMI 50.84 kg/m  CONSTITUTIONAL: Alert and oriented and responds appropriately to questions. Well-appearing; well-nourished HEAD: Normocephalic EYES: Conjunctivae clear, pupils appear equal, EOMI ENT: normal nose; moist mucous membranes; No pharyngeal erythema or petechiae, no tonsillar hypertrophy or exudate, no uvular deviation, no unilateral swelling, no trismus or drooling, no muffled voice, normal phonation, no stridor, no dental caries present, no drainable dental abscess noted, no Ludwig's angina, tongue sits flat in the bottom of the mouth, no angioedema, no facial erythema or warmth, no facial swelling; no pain with movement of the neck. NECK: Supple, no meningismus, no nuchal rigidity, no LAD  CARD: RRR; S1 and S2 appreciated; no murmurs, no clicks, no rubs, no gallops RESP: Normal chest  excursion without splinting or tachypnea; patient has equal breath sounds bilaterally but diffuse expiratory wheezing and productive cough,, no rhonchi, no rales, no hypoxia or respiratory distress, speaking full sentences ABD/GI: Normal bowel sounds; non-distended; soft, non-tender, no rebound, no guarding, no peritoneal signs, no hepatosplenomegaly BACK:  The back appears normal and is non-tender to palpation, there is no CVA tenderness EXT: Normal ROM in all joints; non-tender to palpation; no edema; normal capillary refill; no cyanosis, no calf tenderness or swelling    SKIN: Normal color for age and race; warm; no rash NEURO: Moves all extremities equally PSYCH: The patient's mood and manner are appropriate. Grooming and personal hygiene are appropriate.  MEDICAL DECISION MAKING: Patient here with flulike symptoms.  Will obtain chest x-ray to rule out pneumonia.  Will also give breathing  treatments and steroids given her history of asthma that she is currently wheezing.  She does not appear volume overloaded on exam.  Doubt ACS or PE.  Will give Tylenol for subjective fever and mild headache.  Doubt meningitis.  She appears well-hydrated.  She does not appear septic today.  Will check basic labs including INR.  ED PROGRESS: Chest x-ray shows no pneumonia.  Labs pending.  Will send flu swab as well.  Signed out to Dr. Anitra Lauth.   I reviewed all nursing notes, vitals, pertinent previous records, EKGs, lab and urine results, imaging (as available).      Brynda Heick, Layla Maw, DO 01/11/17 [phone]

## 2017-01-11 NOTE — ED Notes (Addendum)
Ambulated pt in hallway while monitoring pulse ox. O2 was 96% at bedside, 94% while ambulating, 97% upon returning to bedside. Pt ambulatory with cane from home (pt normally ambulates with cane). Pt tolerated well.

## 2017-01-11 NOTE — ED Triage Notes (Signed)
The pt is c/o a cough congestion and a scratchy throat today   She woke up with it and could not sleep

## 2017-01-11 NOTE — ED Provider Notes (Signed)
Assumed care from Dr. Elesa Massed at 7 AM.  Patient's lab work is reassuring.  Flu PCR is negative.  Lactate within normal limits.  On reevaluation patient was still wheezing and given a second treatment.  An hour later reevaluated and wheezing improved but still having some mild wheezing.  Will give a third treatment.  Patient is already received steroids.  Will ambulate the patient to ensure she is safe for discharge home.  No evidence of pneumonia at this time.  12:32 PM Pt was able to ambulate and felt breathing was OK.  She will be d/ced home with new inhaler and steroids.  Pt was flu neg   Gwyneth Sprout, MD 01/11/17 1233

## 2017-01-11 NOTE — ED Notes (Signed)
Patient transported to X-ray 

## 2017-01-13 ENCOUNTER — Other Ambulatory Visit: Payer: Self-pay | Admitting: *Deleted

## 2017-01-13 ENCOUNTER — Other Ambulatory Visit: Payer: Self-pay | Admitting: Cardiology

## 2017-01-13 DIAGNOSIS — J4540 Moderate persistent asthma, uncomplicated: Secondary | ICD-10-CM

## 2017-01-16 ENCOUNTER — Ambulatory Visit (INDEPENDENT_AMBULATORY_CARE_PROVIDER_SITE_OTHER): Payer: Medicare Other | Admitting: Pharmacist

## 2017-01-16 DIAGNOSIS — Z86718 Personal history of other venous thrombosis and embolism: Secondary | ICD-10-CM | POA: Diagnosis not present

## 2017-01-16 DIAGNOSIS — Z7901 Long term (current) use of anticoagulants: Secondary | ICD-10-CM | POA: Diagnosis not present

## 2017-01-16 DIAGNOSIS — I82409 Acute embolism and thrombosis of unspecified deep veins of unspecified lower extremity: Secondary | ICD-10-CM

## 2017-01-16 DIAGNOSIS — Z86711 Personal history of pulmonary embolism: Secondary | ICD-10-CM | POA: Diagnosis not present

## 2017-01-16 LAB — POCT INR: INR: 2.7

## 2017-01-16 MED ORDER — BUDESONIDE-FORMOTEROL FUMARATE 160-4.5 MCG/ACT IN AERO: 2.0000 | INHALATION_SPRAY | Freq: Two times a day (BID) | RESPIRATORY_TRACT | 4 refills | Status: DC

## 2017-01-16 NOTE — Patient Instructions (Signed)
Patient instructed to take medications as defined in the Anti-coagulation Track section of this encounter.  Patient instructed to take today's dose.  Patient instructed to take 1/2 tablet of your 4mg  blue warfarin tablet on Mondays and Thursdays. All other days, take one (1) tablet.  Patient verbalized understanding of these instructions.

## 2017-01-16 NOTE — Progress Notes (Signed)
Anticoagulation Management Caroline [name] is a 72 y.o. female who reports to the clinic for monitoring of warfarin treatment.    Indication: DVT, Lower extremity, recurrent-history of (HCC)[Z182, 409], PE, History of (Resolved) [Z86.718], Long term use of anticoagulants.  Duration: indefinite Supervising physician: Debe Coder  Anticoagulation Clinic Visit History: Patient does not report signs/symptoms of bleeding or thromboembolism  Other recent changes: No diet, medications, lifestyle changes noted other than as documented in patient findings.  Anticoagulation Episode Summary    Current INR goal:   2.0-3.0  TTR:   87.7 % (1.9 y)  Next INR check:   01/16/2017  INR from last check:   2.00 (12/26/2016)  Most recent INR:    2.32 (01/11/2017)  Weekly max warfarin dose:     Target end date:     INR check location:   [facility]  Preferred lab:     Send INR reminders to:      Indications   DVT lower extremity recurrent (HCC) [I82.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 Penicillins Anaphylaxis, Hives, Swelling and Rash    Has patient had a PCN reaction causing immediate rash, facial/tongue/throat swelling, SOB or lightheadedness with hypotension: Yes Has patient had a PCN reaction causing severe rash involving mucus membranes or skin necrosis: Yes Has patient had a PCN reaction that required hospitalization Yes Has patient had a PCN reaction occurring within the last 10 years: No If all of the above answers are "NO", then may proceed with Cephalosporin use.   . Cabbage Itching  . Keflex [Cephalexin] Hives    And feeling of throat tightness  . Shellfish Allergy Swelling  . Tomato Swelling  . Latex Itching and Rash   Prior to Admission medications   Medication Sig Start Date End Date Taking? Authorizing Provider  acetaminophen (TYLENOL) 500 MG tablet Take 500 mg by mouth every 6 (six) hours as needed  for mild pain or moderate pain. pain   Yes [provider]  albuterol (PROAIR HFA) 108 (90 Base) MCG/ACT inhaler INHALE 2 PUFFS INTO THE LUNGS EVERY 6 (SIX) HOURS AS NEEDED FOR WHEEZING OR SHORTNESS OF BREATH. 03/08/16  Yes Rivet, Iris Pert, MD  budesonide-formoterol (SYMBICORT) 160-4.5 MCG/ACT inhaler Inhale 2 puffs into the lungs 2 (two) times daily. 01/16/17  Yes Molt, Bethany, DO  calcium citrate-vitamin D (CITRACAL+D) 315-200 MG-UNIT tablet Take 2 tablets daily by mouth. 11/14/16  Yes [name], MD  carvedilol (COREG) 12.5 MG tablet Take 1 tablet (12.5 mg total) by mouth 2 (two) times daily. 11/29/16 02/27/17 Yes Molt, Bethany, DO  fluticasone (FLONASE) 50 MCG/ACT nasal spray PLACE 2 SPRAYS DAILY INTO BOTH NOSTRILS. 12/21/16  Yes Molt, Bethany, DO  furosemide (LASIX) 40 MG tablet Take 1 tablet (40 mg total) by mouth every other day. 11/29/16  Yes Molt, Bethany, DO  hydrocortisone (PROCTOSOL HC) 2.5 % rectal cream PLACE 1 APPLICATION RECTALLY 2 TIMES DAILY. 06/21/16  Yes Rivet, Iris Pert, MD  losartan (COZAAR) 100 MG tablet Take 1 tablet (100 mg total) by mouth daily. 11/29/16  Yes Molt, Bethany, DO  meclizine (ANTIVERT) 25 MG tablet Take 1 tablet (25 mg total) 3 (three) times daily as needed by mouth. 11/23/16  Yes Molt, Bethany, DO  montelukast (SINGULAIR) 10 MG tablet Take 1 tablet (10 mg total) at bedtime by mouth. 11/14/16  Yes [name], MD  pantoprazole (PROTONIX) 40  MG tablet Take 1 tablet (40 mg total) by mouth daily. 11/29/16  Yes Molt, Bethany, DO  pravastatin (PRAVACHOL) 80 MG tablet TAKE 1 TABLET (80 MG TOTAL) BY MOUTH DAILY. 11/29/16  Yes Molt, Bethany, DO  predniSONE (DELTASONE) 20 MG tablet Take 2 tablets (40 mg total) by mouth daily. 01/11/17  Yes Plunkett, Alphonzo Lemmings, MD  RESTASIS MULTIDOSE 0.05 % ophthalmic emulsion Place 1 drop into both eyes 2 (two) times daily. Use once daily 08/19/16  Yes [provider]  warfarin (COUMADIN) 4 MG tablet TAKE 1 TABLET BY MOUTH  DAILY--EXCEPT ON WEDNESDAYS--TAKE ONLY 1/2 TABLET ON WEDNESDAYS. Patient taking differently: 2mg  by mouth once daily on Wednesdays, 4mg  by mouth once daily all other days of week 09/06/16  Yes Molt, Toma Copier, DO   Past Medical History:  Diagnosis Date  . Arthritis   . Asthma   . Bronchitis   . CHF (congestive heart failure) (HCC)   . DVT, lower extremity, recurrent (HCC)    Patient had unprovoked PE on 2002 and DVT in right lower extremety 2008.  [name] GERD (gastroesophageal reflux disease)   . History of kidney stones   . Hypertension   . PE (pulmonary embolism)    Patient had unprovoked PE on 2002  . PONV (postoperative nausea and vomiting)   . Vertigo    Social History   Socioeconomic History  . Marital status: Divorced    Spouse name: Not on file  . Number of children: Not on file  . Years of education: Not on file  . Highest education level: Not on file  Social Needs  . Financial resource strain: Not on file  . Food insecurity - worry: Not on file  . Food insecurity - inability: Not on file  . Transportation needs - medical: Not on file  . Transportation needs - non-medical: Not on file  Occupational History  . Not on file  Tobacco Use  . Smoking status: Former Smoker    Types: Cigarettes    Last attempt to quit: 09/18/1988    Years since quitting: 28.3  . Smokeless tobacco: Never Used  Substance and Sexual Activity  . Alcohol use: No  . Drug use: No  . Sexual activity: No    Birth control/protection: None  Other Topics Concern  . Not on file  Social History Narrative  . Not on file   Family History  Problem Relation Age of Onset  . Cancer Mother   . Hypertension Sister   . Hypertension Brother   . Diabetes Brother   . Hypertension Sister   . Colon cancer Other   . Heart Problems Other 34       sister's child, open heart surgery    ASSESSMENT Recent Results: The most recent result is correlated with 26 mg per week: Lab Results  Component Value Date    INR 2.32 01/11/2017   INR 2.16 12/31/2016   INR 2.00 12/26/2016    Anticoagulation Dosing: Description   Take 1 tablet of your blue-colored 4mg  strength warfarin tablets by mouth once-daily at 6PM all days of week--EXCEPT on Wednesdays-- take 1/2 tablet of your 4mg  blue warfarin tablet(s) on Wednesdays.     INR today: Therapeutic  PLAN Weekly dose was decreased by 8% to 18 mg per week  Patient Instructions  Patient instructed to take medications as defined in the Anti-coagulation Track section of this encounter.  Patient instructed to take today's dose.  Patient instructed to take 1/2 tablet of your 4mg  blue warfarin  tablet on Mondays and Thursdays. All other days, take one (1) tablet.  Patient verbalized understanding of these instructions.     Patient advised to contact clinic or seek medical attention if signs/symptoms of bleeding or thromboembolism occur.  Patient verbalized understanding by repeating back information and was advised to contact me if further medication-related questions arise. Patient was also provided an information handout.  Follow-up Return in about 4 weeks (around 02/13/2017) for Follow up INR at 1045h.  Elicia Lamp, PharmD, CACP, CPP  15 minutes spent face-to-face with the patient during the encounter. 50% of time spent on education. 50% of time was spent on fingerstick point of care INR sample collection, processing, results determination, dose adjustment and documentation in [email or web address]

## 2017-01-17 NOTE — Progress Notes (Signed)
Dr. Saralyn Pilar note reviewed.  INR at goal for history of recurrent VTE.

## 2017-01-24 DIAGNOSIS — I8310 Varicose veins of unspecified lower extremity with inflammation: Secondary | ICD-10-CM | POA: Diagnosis not present

## 2017-01-24 DIAGNOSIS — I872 Venous insufficiency (chronic) (peripheral): Secondary | ICD-10-CM | POA: Diagnosis not present

## 2017-01-26 ENCOUNTER — Encounter: Payer: Self-pay | Admitting: Cardiology

## 2017-01-26 ENCOUNTER — Encounter (INDEPENDENT_AMBULATORY_CARE_PROVIDER_SITE_OTHER): Payer: Self-pay

## 2017-01-26 ENCOUNTER — Ambulatory Visit (INDEPENDENT_AMBULATORY_CARE_PROVIDER_SITE_OTHER): Payer: Medicare Other | Admitting: Cardiology

## 2017-01-26 VITALS — BP 130/74 | HR 63 | Ht 63.0 in | Wt 277.0 lb

## 2017-01-26 DIAGNOSIS — I10 Essential (primary) hypertension: Secondary | ICD-10-CM

## 2017-01-26 DIAGNOSIS — Z79899 Other long term (current) drug therapy: Secondary | ICD-10-CM

## 2017-01-26 DIAGNOSIS — I5042 Chronic combined systolic (congestive) and diastolic (congestive) heart failure: Secondary | ICD-10-CM

## 2017-01-26 DIAGNOSIS — I38 Endocarditis, valve unspecified: Secondary | ICD-10-CM

## 2017-01-26 MED ORDER — MAGNESIUM OXIDE 400 MG PO CAPS: 1.0000 | ORAL_CAPSULE | Freq: Two times a day (BID) | ORAL | 11 refills | Status: DC

## 2017-01-26 NOTE — Patient Instructions (Addendum)
Medication Instructions:  Your physician has recommended you make the following change in your medication:  1. RESTART Magnesium 400 mg twice daily  * If you need a refill on your cardiac medications before your next appointment, please call your pharmacy. *  Labwork: Your physician recommends that you return for lab work in: 2 weeks for Magnesium level  Testing/Procedures: None ordered  Follow-Up: Your physician wants you to follow-up in: 1 year with Dr. Elberta Fortis.  You will receive a reminder letter in the mail two months in advance. If you don't receive a letter, please call our office to schedule the follow-up appointment.  Thank you for choosing [facility]!!   Dory Horn, RN [phone]  Any Other Special Instructions Will Be Listed Below (If Applicable).

## 2017-01-26 NOTE — Addendum Note (Signed)
Addended by: Baird Lyons on: 01/26/2017 12:25 PM   Modules accepted: Orders

## 2017-01-26 NOTE — Progress Notes (Signed)
Electrophysiology Office Note   Date:  01/26/2017   ID:  EUFELIA VENO, DOB Feb 28, 1945, MRN [id number]  PCP:  Noemi Chapel, DO  Cardiologist:  Harjas Biggins Jorja Loa, MD    Chief Complaint  Patient presents with  . Follow-up    Chronic combined systolic and diastolic CHF/Essential hypertension     History of Present Illness: Caroline [name] is a 72 y.o. female who presents today for electrophysiology evaluation.   S/p hospital admission from 09/16/15-09/23/15 with klebsiella pneumonia sepsis and right ureteral calculi. She was started on iv fluids and iv antibiotics. She underwent cystoscopy and right retrograde ureterogram and had right ureteral stent placed. She was followed by urology. She has PMH of pulmonary embolism, HTN, combined CHF.   Today, denies symptoms of palpitations, chest pain, shortness of breath, orthopnea, PND, lower extremity edema, claudication, dizziness, presyncope, syncope, bleeding, or neurologic sequela. The patient is tolerating medications without difficulties.  She has been feeling well without complaint.  She has noted no further palpitations.  She has not been short of breath.  She was recently diagnosed with a viral URI and bronchitis.  She is improved since emergency room visit and breathing treatments.  Past Medical History:  Diagnosis Date  . Arthritis   . Asthma   . Bronchitis   . CHF (congestive heart failure) (HCC)   . DVT, lower extremity, recurrent (HCC)    Patient had unprovoked PE on 2002 and DVT in right lower extremety 2008.  [name] GERD (gastroesophageal reflux disease)   . History of kidney stones   . Hypertension   . PE (pulmonary embolism)    Patient had unprovoked PE on 2002  . PONV (postoperative nausea and vomiting)   . Vertigo    Past Surgical History:  Procedure Laterality Date  . CYSTOSCOPY W/ URETERAL STENT PLACEMENT Right 09/16/2015   Procedure: CYSTOSCOPY WITH RETROGRADE PYELOGRAM/URETERAL STENT PLACEMENT;  Surgeon: Alfredo Martinez, MD;  Location: [facility];  Service: Urology;  Laterality: Right;  . CYSTOSCOPY WITH RETROGRADE PYELOGRAM, URETEROSCOPY AND STENT PLACEMENT Right 12/11/2015   Procedure: RIGHT URETEROSCOPY/HOLMIUM LASER LITHOTRIPSY AND STONE REMOVAL removal and placement of double j stent;  Surgeon: Crist Fat, MD;  Location: [facility];  Service: Urology;  Laterality: Right;  . HOLMIUM LASER APPLICATION Right 12/11/2015   Procedure: HOLMIUM LASER APPLICATION;  Surgeon: Crist Fat, MD;  Location: [facility];  Service: Urology;  Laterality: Right;     Current Outpatient Medications  Medication Sig Dispense Refill  . acetaminophen (TYLENOL) 500 MG tablet Take 500 mg by mouth every 6 (six) hours as needed for mild pain or moderate pain. pain    . albuterol (PROAIR HFA) 108 (90 Base) MCG/ACT inhaler INHALE 2 PUFFS INTO THE LUNGS EVERY 6 (SIX) HOURS AS NEEDED FOR WHEEZING OR SHORTNESS OF BREATH. 8.5 Inhaler 5  . budesonide-formoterol (SYMBICORT) 160-4.5 MCG/ACT inhaler Inhale 2 puffs into the lungs 2 (two) times daily. 3 Inhaler 4  . calcium citrate-vitamin D (CITRACAL+D) 315-200 MG-UNIT tablet Take 2 tablets daily by mouth. 100 tablet 3  . carvedilol (COREG) 12.5 MG tablet Take 1 tablet (12.5 mg total) by mouth 2 (two) times daily. 180 tablet 3  . fluticasone (FLONASE) 50 MCG/ACT nasal spray PLACE 2 SPRAYS DAILY INTO BOTH NOSTRILS. 48 g 1  . furosemide (LASIX) 40 MG tablet Take 1 tablet (40 mg total) by mouth every other day. 90 tablet 1  . hydrocortisone (PROCTOSOL HC) 2.5 % rectal cream PLACE 1 APPLICATION RECTALLY 2 TIMES  DAILY. 28.35 g 5  . losartan (COZAAR) 100 MG tablet Take 1 tablet (100 mg total) by mouth daily. 90 tablet 1  . meclizine (ANTIVERT) 25 MG tablet Take 1 tablet (25 mg total) 3 (three) times daily as needed by mouth. 270 tablet 1  . montelukast (SINGULAIR) 10 MG tablet Take 1 tablet (10 mg total) at bedtime by mouth. 90 tablet 3  . pantoprazole (PROTONIX) 40 MG tablet Take 1 tablet  (40 mg total) by mouth daily. 90 tablet 1  . pravastatin (PRAVACHOL) 80 MG tablet TAKE 1 TABLET (80 MG TOTAL) BY MOUTH DAILY. 90 tablet 1  . predniSONE (DELTASONE) 20 MG tablet Take 2 tablets (40 mg total) by mouth daily. 10 tablet 0  . RESTASIS MULTIDOSE 0.05 % ophthalmic emulsion Place 1 drop into both eyes 2 (two) times daily. Use once daily    . warfarin (COUMADIN) 4 MG tablet TAKE 1 TABLET BY MOUTH DAILY--EXCEPT ON WEDNESDAYS--TAKE ONLY 1/2 TABLET ON WEDNESDAYS. (Patient taking differently: 2mg  by mouth once daily on Wednesdays, 4mg  by mouth once daily all other days of week) 31 tablet 2   No current facility-administered medications for this visit.     Allergies:   Penicillins; Cabbage; Keflex [cephalexin]; Shellfish allergy; Tomato; and Latex   Social History:  The patient  reports that she quit smoking about 28 years ago. Her smoking use included cigarettes. she has never used smokeless tobacco. She reports that she does not drink alcohol or use drugs.   Family History:  The patient's family history includes Cancer in her mother; Colon cancer in her other; Diabetes in her brother; Heart Problems (age of onset: 54) in her other; Hypertension in her brother, sister, and sister.   ROS:  Please see the history of present illness.   Otherwise, review of systems is positive for, wheezing, joint swelling, muscle pain, easy bruising.   All other systems are reviewed and negative.   PHYSICAL EXAM: VS:  BP 130/74   Pulse 63   Ht 5\' 3"  (1.6 m)   Wt 277 lb (125.6 kg)   BMI 49.07 kg/m  , BMI Body mass index is 49.07 kg/m. GEN: Well nourished, well developed, in no acute distress  HEENT: normal  Neck: no JVD, carotid bruits, or masses Cardiac: RRR; no murmurs, rubs, or gallops,no edema  Respiratory:  clear to auscultation bilaterally, normal work of breathing GI: soft, nontender, nondistended, + BS MS: no deformity or atrophy  Skin: warm and dry Neuro:  Strength and sensation are  intact Psych: euthymic mood, full affect  EKG:  EKG is ordered today. Personal review of the ekg ordered shows sinus rhythm, right bundle branch block, left anterior fascicular block, LVH, inferior T wave abnormalities  PAD Screen 10/28/2015  Previous PAD dx? No  Previous surgical procedure? Yes  Dates of procedures 09/16/15  Pain with walking? No  Feet/toe relief with dangling? No  Painful, non-healing ulcers? No  Extremities discolored? No      Recent Labs: 12/31/2016: ALT 19; Magnesium 1.6 01/11/2017: BUN 7; Creatinine, Ser 0.79; Hemoglobin 10.4; Platelets 201; Potassium 3.1; Sodium 141    Lipid Panel     Component Value Date/Time   CHOL 205 (H) 06/21/2016 1400   TRIG 165 (H) 06/21/2016 1400   HDL 44 06/21/2016 1400   CHOLHDL 4.7 (H) 06/21/2016 1400   CHOLHDL 4.9 04/08/2014 1353   VLDL 44 (H) 04/08/2014 1353   LDLCALC 128 (H) 06/21/2016 1400     Wt Readings from Last 3  Encounters:  01/26/17 277 lb (125.6 kg)  01/11/17 287 lb (130.2 kg)  12/31/16 270 lb 8.1 oz (122.7 kg)      Other studies Reviewed: Additional studies/ records that were reviewed today include: TTE 09/21/15  Review of the above records today demonstrates:  - Left ventricle: The cavity size was normal. Systolic function was   mildly reduced. The estimated ejection fraction was in the range   of 45% to 50%. Wall motion was normal; there were no regional   wall motion abnormalities. Features are consistent with a   pseudonormal left ventricular filling pattern, with concomitant   abnormal relaxation and increased filling pressure (grade 2   diastolic dysfunction). - Left atrium: The atrium was moderately dilated.  30 day monitor 09/06/16 - personally reviewed Sinus rhythm, no arrhythmias noted.  ASSESSMENT AND PLAN:  1.  Chronic combined systolic and diastolic HF: Doing well without complaints of volume overload.  Currently on carvedilol and losartan.  No changes.  2. Hypertension: Blood pressure  currently well controlled.  No changes.  3. Palpitations: 30-day monitor without arrhythmias.  She has had no further palpitations.  No changes.   Current medicines are reviewed at length with the patient today.   The patient does not have concerns regarding her medicines.  The following changes were made today: None  Labs/ tests ordered today include:  Orders Placed This Encounter  Procedures  . EKG 12-Lead     Disposition:   FU with Cecilia Vancleve with 12 months  Signed, Riese Hellard Jorja Loa, MD  01/26/2017 12:02 PM     [facility] [address] [place] [address] [phone] (office) [phone] (fax)

## 2017-02-06 ENCOUNTER — Ambulatory Visit (INDEPENDENT_AMBULATORY_CARE_PROVIDER_SITE_OTHER): Payer: Medicare Other | Admitting: Pharmacist

## 2017-02-06 DIAGNOSIS — Z5181 Encounter for therapeutic drug level monitoring: Secondary | ICD-10-CM

## 2017-02-06 DIAGNOSIS — Z86718 Personal history of other venous thrombosis and embolism: Secondary | ICD-10-CM

## 2017-02-06 DIAGNOSIS — I82402 Acute embolism and thrombosis of unspecified deep veins of left lower extremity: Secondary | ICD-10-CM

## 2017-02-06 DIAGNOSIS — Z7901 Long term (current) use of anticoagulants: Secondary | ICD-10-CM | POA: Diagnosis not present

## 2017-02-06 DIAGNOSIS — I82409 Acute embolism and thrombosis of unspecified deep veins of unspecified lower extremity: Secondary | ICD-10-CM

## 2017-02-06 LAB — POCT INR: INR: 4.2

## 2017-02-06 NOTE — Patient Instructions (Signed)
Patient instructed to take medications as defined in the Anti-coagulation Track section of this encounter.  Patient instructed to OMIT today's dose.  Patient instructed to take  1 tablet of your blue-colored 4mg  strength warfarin tablets by mouth once-daily at 6PM all days of week on Tuesdays, Thursdays and Saturdays; all other days, take only 1/2 tablet.  Patient verbalized understanding of these instructions.

## 2017-02-06 NOTE — Progress Notes (Signed)
Anticoagulation Management Caroline [name] is a 72 y.o. female who reports to the clinic for monitoring of warfarin treatment.    Indication: DVT, lower extremitiy, recurrent; PE, History of; long term current use of anticoagulants.  Duration: indefinite Supervising physician: Debe Coder  Anticoagulation Clinic Visit History: Patient does not report signs/symptoms of bleeding or thromboembolism  Other recent changes: No diet, medications, lifestyle changes endorsed.  Anticoagulation Episode Summary    Current INR goal:   2.0-3.0  TTR:   85.8 % (2 y)  Next INR check:   02/13/2017  INR from last check:   4.20! (02/06/2017)  Weekly max warfarin dose:     Target end date:     INR check location:   [facility]  Preferred lab:     Send INR reminders to:      Indications   DVT lower extremity recurrent (HCC) [I82.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 Penicillins Anaphylaxis, Hives, Swelling and Rash    Has patient had a PCN reaction causing immediate rash, facial/tongue/throat swelling, SOB or lightheadedness with hypotension: Yes Has patient had a PCN reaction causing severe rash involving mucus membranes or skin necrosis: Yes Has patient had a PCN reaction that required hospitalization Yes Has patient had a PCN reaction occurring within the last 10 years: No If all of the above answers are "NO", then may proceed with Cephalosporin use.   . Cabbage Itching  . Keflex [Cephalexin] Hives    And feeling of throat tightness  . Shellfish Allergy Swelling  . Tomato Swelling  . Latex Itching and Rash   Prior to Admission medications   Medication Sig Start Date End Date Taking? Authorizing Provider  acetaminophen (TYLENOL) 500 MG tablet Take 500 mg by mouth every 6 (six) hours as needed for mild pain or moderate pain. pain   Yes [provider]  albuterol (PROAIR HFA) 108 (90 Base) MCG/ACT inhaler  INHALE 2 PUFFS INTO THE LUNGS EVERY 6 (SIX) HOURS AS NEEDED FOR WHEEZING OR SHORTNESS OF BREATH. 03/08/16  Yes Rivet, Iris Pert, MD  budesonide-formoterol (SYMBICORT) 160-4.5 MCG/ACT inhaler Inhale 2 puffs into the lungs 2 (two) times daily. 01/16/17  Yes Molt, Bethany, DO  calcium citrate-vitamin D (CITRACAL+D) 315-200 MG-UNIT tablet Take 2 tablets daily by mouth. 11/14/16  Yes [name], MD  carvedilol (COREG) 12.5 MG tablet Take 1 tablet (12.5 mg total) by mouth 2 (two) times daily. 11/29/16 02/27/17 Yes Molt, Bethany, DO  fluticasone (FLONASE) 50 MCG/ACT nasal spray PLACE 2 SPRAYS DAILY INTO BOTH NOSTRILS. 12/21/16  Yes Molt, Bethany, DO  furosemide (LASIX) 40 MG tablet Take 1 tablet (40 mg total) by mouth every other day. 11/29/16  Yes Molt, Bethany, DO  hydrocortisone (PROCTOSOL HC) 2.5 % rectal cream PLACE 1 APPLICATION RECTALLY 2 TIMES DAILY. 06/21/16  Yes Rivet, Iris Pert, MD  losartan (COZAAR) 100 MG tablet Take 1 tablet (100 mg total) by mouth daily. 11/29/16  Yes Molt, Bethany, DO  Magnesium Oxide 400 MG CAPS Take 1 capsule (400 mg total) by mouth 2 (two) times daily. 01/26/17  Yes Camnitz, Will [name], MD  meclizine (ANTIVERT) 25 MG tablet Take 1 tablet (25 mg total) 3 (three) times daily as needed by mouth. 11/23/16  Yes Molt, Bethany, DO  montelukast (SINGULAIR) 10 MG tablet Take 1 tablet (10 mg total) at bedtime by mouth. 11/14/16  Yes [name],  MD  pantoprazole (PROTONIX) 40 MG tablet Take 1 tablet (40 mg total) by mouth daily. 11/29/16  Yes Molt, Bethany, DO  pravastatin (PRAVACHOL) 80 MG tablet TAKE 1 TABLET (80 MG TOTAL) BY MOUTH DAILY. 11/29/16  Yes Molt, Bethany, DO  predniSONE (DELTASONE) 20 MG tablet Take 2 tablets (40 mg total) by mouth daily. 01/11/17  Yes Plunkett, Alphonzo Lemmings, MD  RESTASIS MULTIDOSE 0.05 % ophthalmic emulsion Place 1 drop into both eyes 2 (two) times daily. Use once daily 08/19/16  Yes [provider]  warfarin (COUMADIN) 4 MG tablet TAKE 1 TABLET BY  MOUTH DAILY--EXCEPT ON WEDNESDAYS--TAKE ONLY 1/2 TABLET ON WEDNESDAYS. Patient taking differently: 2mg  by mouth once daily on Wednesdays, 4mg  by mouth once daily all other days of week 09/06/16  Yes Molt, Toma Copier, DO   Past Medical History:  Diagnosis Date  . Arthritis   . Asthma   . Bronchitis   . CHF (congestive heart failure) (HCC)   . DVT, lower extremity, recurrent (HCC)    Patient had unprovoked PE on 2002 and DVT in right lower extremety 2008.  [name] GERD (gastroesophageal reflux disease)   . History of kidney stones   . Hypertension   . PE (pulmonary embolism)    Patient had unprovoked PE on 2002  . PONV (postoperative nausea and vomiting)   . Vertigo    Social History   Socioeconomic History  . Marital status: Divorced    Spouse name: Not on file  . Number of children: Not on file  . Years of education: Not on file  . Highest education level: Not on file  Social Needs  . Financial resource strain: Not on file  . Food insecurity - worry: Not on file  . Food insecurity - inability: Not on file  . Transportation needs - medical: Not on file  . Transportation needs - non-medical: Not on file  Occupational History  . Not on file  Tobacco Use  . Smoking status: Former Smoker    Types: Cigarettes    Last attempt to quit: 09/18/1988    Years since quitting: 28.4  . Smokeless tobacco: Never Used  Substance and Sexual Activity  . Alcohol use: No  . Drug use: No  . Sexual activity: No    Birth control/protection: None  Other Topics Concern  . Not on file  Social History Narrative  . Not on file   Family History  Problem Relation Age of Onset  . Cancer Mother   . Hypertension Sister   . Hypertension Brother   . Diabetes Brother   . Hypertension Sister   . Colon cancer Other   . Heart Problems Other 34       sister's child, open heart surgery    ASSESSMENT Recent Results: The most recent result is correlated with 18 mg per week: Lab Results  Component Value  Date   INR 4.20 02/06/2017   INR 2.7 01/16/2017   INR 2.32 01/11/2017    Anticoagulation Dosing: Description   Take 1 tablet of your blue-colored 4mg  strength warfarin tablets by mouth once-daily at 6PM all days of week on Tuesdays, Thursdays and Saturdays; all other days, take only 1/2 tablet.      INR today: Supratherapeutic  PLAN Weekly dose was decreased by 17% to 15 mg per week and today's dose omitted.   Patient Instructions  Patient instructed to take medications as defined in the Anti-coagulation Track section of this encounter.  Patient instructed to OMIT today's dose.  Patient  instructed to take  1 tablet of your blue-colored 4mg  strength warfarin tablets by mouth once-daily at 6PM all days of week on Tuesdays, Thursdays and Saturdays; all other days, take only 1/2 tablet.  Patient verbalized understanding of these instructions.     Patient advised to contact clinic or seek medical attention if signs/symptoms of bleeding or thromboembolism occur.  Patient verbalized understanding by repeating back information and was advised to contact me if further medication-related questions arise. Patient was also provided an information handout.  Follow-up Return in 7 days (on 02/13/2017) for Follow up INR at 1030h.  Elicia Lamp, PharmD, CACP, CPP  15 minutes spent face-to-face with the patient during the encounter. 50% of time spent on education. 50% of time was spent on fingerstick point of care INR sample collection, processing, results determination, dose adjustment and documentation in [email or web address]

## 2017-02-07 NOTE — Progress Notes (Signed)
I have reviewed Dr. Saralyn Pilar note.  Patient is on [facility] for VTE, INR elevated and dose of coumadin decreased with follow up planned.

## 2017-02-07 NOTE — Progress Notes (Signed)
   CC: follow-up of ED visit for bronchitis  HPI:  [name] is a 72 y.o. F who was seen in ED 01/11/17 with 2 day history of subjective fevers, cough productive of brown sputum, wheezing and body aches. Wheezing improved with several breathing treatments. CXR negative for infiltrate and she was sent home with a several day course of Prednisone for treatment of bronchitis. Patient completed her course of prednisone and feels much improved. Still with occasional cough but is not productive. She has been using her PRN albuterol every few days for cough.   Past Medical History:  Diagnosis Date  . Arthritis   . Asthma   . Bronchitis   . CHF (congestive heart failure) (HCC)   . DVT, lower extremity, recurrent (HCC)    Patient had unprovoked PE on 2002 and DVT in right lower extremety 2008.  [name] GERD (gastroesophageal reflux disease)   . History of kidney stones   . Hypertension   . PE (pulmonary embolism)    Patient had unprovoked PE on 2002  . PONV (postoperative nausea and vomiting)   . Vertigo    Review of Systems:   General: Denies fevers, chills HEENT: Denies sore throat, dysphagia Cardiac: Denies CP, SOB, palpitations Pulmonary: +cough. +occasional wheeze. Denies PND Abd: Denies diarrhea, constipation, changes in bowels Extremities: Denies swelling  Physical Exam: General: Alert, in no acute distress. Pleasant and conversant HEENT: No icterus, injection or ptosis. No hoarseness or dysarthria  Cardiac: RRR, no MGR appreciated Pulmonary: CTA BL with normal WOB on RA. Able to speak in complete sentences Abd: Soft, non-tender. +bs Extremities: Warm, perfused. No significant pedal edema.   Vitals:   02/08/17 1327 02/08/17 1342  BP: (!) 144/57 132/74  Pulse: 69   Temp: 98 F (36.7 C)   TempSrc: Oral   SpO2: 98%   Weight: 282 lb 8 oz (128.1 kg)   Height: 5\' 3"  (1.6 m)    Assessment & Plan:   See Encounters Tab for problem based charting.  Patient discussed with Dr.  Sandre Kitty

## 2017-02-08 ENCOUNTER — Other Ambulatory Visit: Payer: Medicare Other | Admitting: *Deleted

## 2017-02-08 ENCOUNTER — Other Ambulatory Visit: Payer: Self-pay

## 2017-02-08 ENCOUNTER — Ambulatory Visit (INDEPENDENT_AMBULATORY_CARE_PROVIDER_SITE_OTHER): Payer: Medicare Other | Admitting: Internal Medicine

## 2017-02-08 ENCOUNTER — Encounter: Payer: Self-pay | Admitting: Internal Medicine

## 2017-02-08 VITALS — BP 132/74 | HR 69 | Temp 98.0°F | Ht 63.0 in | Wt 282.5 lb

## 2017-02-08 DIAGNOSIS — I38 Endocarditis, valve unspecified: Secondary | ICD-10-CM

## 2017-02-08 DIAGNOSIS — Z8619 Personal history of other infectious and parasitic diseases: Secondary | ICD-10-CM

## 2017-02-08 DIAGNOSIS — Z87891 Personal history of nicotine dependence: Secondary | ICD-10-CM | POA: Diagnosis not present

## 2017-02-08 DIAGNOSIS — Z7901 Long term (current) use of anticoagulants: Secondary | ICD-10-CM

## 2017-02-08 DIAGNOSIS — Z79899 Other long term (current) drug therapy: Secondary | ICD-10-CM

## 2017-02-08 DIAGNOSIS — I5042 Chronic combined systolic (congestive) and diastolic (congestive) heart failure: Secondary | ICD-10-CM | POA: Diagnosis not present

## 2017-02-08 DIAGNOSIS — F40240 Claustrophobia: Secondary | ICD-10-CM | POA: Diagnosis not present

## 2017-02-08 DIAGNOSIS — R791 Abnormal coagulation profile: Secondary | ICD-10-CM | POA: Diagnosis not present

## 2017-02-08 DIAGNOSIS — Z86718 Personal history of other venous thrombosis and embolism: Secondary | ICD-10-CM | POA: Diagnosis not present

## 2017-02-08 DIAGNOSIS — Z8719 Personal history of other diseases of the digestive system: Secondary | ICD-10-CM | POA: Diagnosis not present

## 2017-02-08 DIAGNOSIS — Z86711 Personal history of pulmonary embolism: Secondary | ICD-10-CM | POA: Diagnosis not present

## 2017-02-08 DIAGNOSIS — J4521 Mild intermittent asthma with (acute) exacerbation: Secondary | ICD-10-CM

## 2017-02-08 DIAGNOSIS — J4541 Moderate persistent asthma with (acute) exacerbation: Secondary | ICD-10-CM | POA: Diagnosis not present

## 2017-02-08 LAB — MAGNESIUM: MAGNESIUM: 2.3 mg/dL (ref 1.6–2.3)

## 2017-02-08 MED ORDER — RESTASIS MULTIDOSE 0.05 % OP EMUL: 1.0000 [drp] | Freq: Two times a day (BID) | OPHTHALMIC | 2 refills | Status: DC

## 2017-02-08 MED ORDER — ALBUTEROL SULFATE (2.5 MG/3ML) 0.083% IN NEBU: 2.5000 mg | INHALATION_SOLUTION | Freq: Four times a day (QID) | RESPIRATORY_TRACT | 12 refills | Status: DC | PRN

## 2017-02-08 MED ORDER — BUDESONIDE-FORMOTEROL FUMARATE 160-4.5 MCG/ACT IN AERO: 2.0000 | INHALATION_SPRAY | Freq: Two times a day (BID) | RESPIRATORY_TRACT | 4 refills | Status: DC

## 2017-02-08 NOTE — Assessment & Plan Note (Signed)
Caroline [name] is on chronic warfarin due to recurrent DVTs and PEs. She follows with our coumadin clinic and INR is usually well controlled. Her INR this week was 4.2. She was recently started on Magnesium by her cardiologist to help supplement her potassium. She has been taking 400mg  twice daily. She takes coumadin at night and has been taking Magnesium shortly after. Taking Magnesium within several hours of coumadin has been shown to increase the INR. We discussed spacing out when she takes these medications as well as taking the decreased dose of coumadin as recommended by Dr. Alexandria Lodge. She will have follow-up INR Monday. She denies any bleeding and will contact clinic should issues arise.

## 2017-02-08 NOTE — Assessment & Plan Note (Signed)
Seen in ED earlier this month with cough, wheezing and body aches. CXR without pneumonia and she was sent home with course of Prednisone for presumed bronchitis. Patient feels much improved since completing her course of prednisone. She did not receive antibiotics. Notes her breathing is back to baseline and only has occasional cough. She has been using her albuterol inhaler every few days due to cough. Lungs are clear on examination and she is speaking to me in complete sentences.  -Refill sent of Spiriva, continue current regimen -Refill sent of albuterol, continue to use only prn -Patient to call should symptoms reoccur  *Of note, she had PFTs which were inconclusive due to early termination because of claustrophobia. She may benefit from Spirometry in the future.

## 2017-02-08 NOTE — Patient Instructions (Addendum)
FOLLOW-UP INSTRUCTIONS When: 6 months For: asthma, coumadin What to bring: nothing  It was great seeing you today! I'm glad you are doing so well.   Please continue taking your current medications as you are EXCEPT I would like you to take your Magnesium at least 2 hours after taking Warfarin. You could take your Warfarin in the morning and Magnesium at night.   I will see you in 6 months!

## 2017-02-08 NOTE — Assessment & Plan Note (Signed)
She denies any further diarrhea since completing her course of PO Vancomycin.

## 2017-02-10 ENCOUNTER — Encounter: Payer: Self-pay | Admitting: *Deleted

## 2017-02-13 ENCOUNTER — Ambulatory Visit (INDEPENDENT_AMBULATORY_CARE_PROVIDER_SITE_OTHER): Payer: Medicare Other | Admitting: Pharmacist

## 2017-02-13 DIAGNOSIS — I82402 Acute embolism and thrombosis of unspecified deep veins of left lower extremity: Secondary | ICD-10-CM | POA: Diagnosis not present

## 2017-02-13 DIAGNOSIS — Z7901 Long term (current) use of anticoagulants: Secondary | ICD-10-CM

## 2017-02-13 LAB — POCT INR: INR: 3.3

## 2017-02-13 NOTE — Progress Notes (Signed)
Internal Medicine Clinic Attending  Case discussed with Dr. Molt  at the time of the visit.  We reviewed the resident's history and exam and pertinent patient test results.  I agree with the assessment, diagnosis, and plan of care documented in the resident's note.  Alexander N Raines, MD   

## 2017-02-13 NOTE — Progress Notes (Signed)
Anticoagulation Management Caroline [name] is a 72 y.o. female who reports to the clinic for monitoring of warfarin treatment.    Indication: DVT  Duration: indefinite Supervising physician: Blanch Media  Anticoagulation Clinic Visit History: Patient does not report signs/symptoms of bleeding or thromboembolism  Other recent changes: No changes reported in diet, medications, lifestyle Anticoagulation Episode Summary    Current INR goal:   2.0-3.0  TTR:   85.0 % (2 y)  Next INR check:   03/06/2017  INR from last check:   3.3! (02/13/2017)  Weekly max warfarin dose:     Target end date:     INR check location:   [facility]  Preferred lab:     Send INR reminders to:   ANT[facility]AG [facility]   Indications   DVT lower extremity recurrent (HCC) [I82.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 Penicillins Anaphylaxis, Hives, Swelling and Rash    Has patient had a PCN reaction causing immediate rash, facial/tongue/throat swelling, SOB or lightheadedness with hypotension: Yes Has patient had a PCN reaction causing severe rash involving mucus membranes or skin necrosis: Yes Has patient had a PCN reaction that required hospitalization Yes Has patient had a PCN reaction occurring within the last 10 years: No If all of the above answers are "NO", then may proceed with Cephalosporin use.   . Cabbage Itching  . Keflex [Cephalexin] Hives    And feeling of throat tightness  . Shellfish Allergy Swelling  . Tomato Swelling  . Latex Itching and Rash   Prior to Admission medications   Medication Sig Start Date End Date Taking? Authorizing Provider  albuterol (PROVENTIL) (2.5 MG/3ML) 0.083% nebulizer solution Take 3 mLs (2.5 mg total) by nebulization every 6 (six) hours as needed for wheezing or shortness of breath. 02/08/17   Molt, Bethany, DO  budesonide-formoterol (SYMBICORT) 160-4.5 MCG/ACT inhaler Inhale 2 puffs into the  lungs 2 (two) times daily. 02/08/17   Molt, Bethany, DO  calcium citrate-vitamin D (CITRACAL+D) 315-200 MG-UNIT tablet Take 2 tablets daily by mouth. 11/14/16   [name], MD  carvedilol (COREG) 12.5 MG tablet Take 1 tablet (12.5 mg total) by mouth 2 (two) times daily. 11/29/16 02/27/17  Molt, Bethany, DO  fluticasone (FLONASE) 50 MCG/ACT nasal spray PLACE 2 SPRAYS DAILY INTO BOTH NOSTRILS. 12/21/16   Molt, Bethany, DO  furosemide (LASIX) 40 MG tablet Take 1 tablet (40 mg total) by mouth every other day. 11/29/16   Molt, Bethany, DO  losartan (COZAAR) 100 MG tablet Take 1 tablet (100 mg total) by mouth daily. 11/29/16   Molt, Bethany, DO  Magnesium Oxide 400 MG CAPS Take 1 capsule (400 mg total) by mouth 2 (two) times daily. 01/26/17   Camnitz, Andree Coss, MD  meclizine (ANTIVERT) 25 MG tablet Take 1 tablet (25 mg total) 3 (three) times daily as needed by mouth. 11/23/16   Molt, Bethany, DO  montelukast (SINGULAIR) 10 MG tablet Take 1 tablet (10 mg total) at bedtime by mouth. 11/14/16   [name], MD  pantoprazole (PROTONIX) 40 MG tablet Take 1 tablet (40 mg total) by mouth daily. 11/29/16   Molt, Bethany, DO  pravastatin (PRAVACHOL) 80 MG tablet TAKE 1 TABLET (80 MG TOTAL) BY MOUTH DAILY. 11/29/16   Molt, Bethany, DO  RESTASIS MULTIDOSE 0.05 % ophthalmic emulsion Place 1 drop into both eyes 2 (two) times daily. Use once daily  02/08/17   Molt, Bethany, DO  warfarin (COUMADIN) 4 MG tablet TAKE 1 TABLET BY MOUTH DAILY--EXCEPT ON WEDNESDAYS--TAKE ONLY 1/2 TABLET ON WEDNESDAYS. Patient taking differently: 2mg  by mouth once daily on Wednesdays, 4mg  by mouth once daily all other days of week 09/06/16   Molt, Toma Copier, DO   Past Medical History:  Diagnosis Date  . Arthritis   . Asthma   . Bronchitis   . CHF (congestive heart failure) (HCC)   . DVT, lower extremity, recurrent (HCC)    Patient had unprovoked PE on 2002 and DVT in right lower extremety 2008.  [name] GERD (gastroesophageal reflux  disease)   . History of kidney stones   . Hypertension   . PE (pulmonary embolism)    Patient had unprovoked PE on 2002  . PONV (postoperative nausea and vomiting)   . Vertigo    Social History   Socioeconomic History  . Marital status: Divorced    Spouse name: Not on file  . Number of children: Not on file  . Years of education: Not on file  . Highest education level: Not on file  Social Needs  . Financial resource strain: Not on file  . Food insecurity - worry: Not on file  . Food insecurity - inability: Not on file  . Transportation needs - medical: Not on file  . Transportation needs - non-medical: Not on file  Occupational History  . Not on file  Tobacco Use  . Smoking status: Former Smoker    Types: Cigarettes    Last attempt to quit: 09/18/1988    Years since quitting: 28.4  . Smokeless tobacco: Never Used  Substance and Sexual Activity  . Alcohol use: No  . Drug use: No  . Sexual activity: No    Birth control/protection: None  Other Topics Concern  . Not on file  Social History Narrative  . Not on file   Family History  Problem Relation Age of Onset  . Cancer Mother   . Hypertension Sister   . Hypertension Brother   . Diabetes Brother   . Hypertension Sister   . Colon cancer Other   . Heart Problems Other 34       sister's child, open heart surgery    ASSESSMENT Recent Results: The most recent result is correlated with 15 mg per week: Lab Results  Component Value Date   INR 3.3 02/13/2017   INR 4.20 02/06/2017   INR 2.7 01/16/2017    Anticoagulation Dosing: Description   Take 1 tablet of your blue-colored 4mg  strength warfarin tablets by mouth once-daily at [facility] on Tuesdays and Thursdays; all other days, take only 1/2 tablet.      INR today: Supratherapeutic  PLAN Weekly dose was decreased by 10% to 13.5 mg per week  Patient Instructions  Patient instructed to take medications as defined in the Anti-coagulation Track section of this  encounter.  Patient instructed to take today's dose.  Patient instructed to take 1 tablet of your blue-colored 4mg  strength warfarin tablets by mouth once-daily at [facility] on Tuesdays and Thursdays; all other days, take only 1/2 tablet.  Patient verbalized understanding of these instructions.   Patient advised to contact clinic or seek medical attention if signs/symptoms of bleeding or thromboembolism occur.  Patient verbalized understanding by repeating back information and was advised to contact me if further medication-related questions arise. Patient was also provided an information handout.  Follow-up Return in 3 weeks (on 03/06/2017) for Follow up INR at 1030.  Roderic Scarce Zigmund Daniel, PharmD PGY1 Pharmacy Resident Pager: [phone]  15 minutes spent face-to-face with the patient during the encounter. 50% of time spent on education. 50% of time was spent on point of care INR testing, results interpretation, dose adjustment, and documentation in [facility] and [email or web address].

## 2017-02-13 NOTE — Patient Instructions (Signed)
Patient instructed to take medications as defined in the Anti-coagulation Track section of this encounter.  Patient instructed to take today's dose.  Patient instructed to take 1 tablet of your blue-colored 4mg  strength warfarin tablets by mouth once-daily at [facility] on Tuesdays and Thursdays; all other days, take only 1/2 tablet.  Patient verbalized understanding of these instructions.

## 2017-02-14 DIAGNOSIS — I872 Venous insufficiency (chronic) (peripheral): Secondary | ICD-10-CM | POA: Diagnosis not present

## 2017-02-14 DIAGNOSIS — I8310 Varicose veins of unspecified lower extremity with inflammation: Secondary | ICD-10-CM | POA: Diagnosis not present

## 2017-03-06 ENCOUNTER — Ambulatory Visit (INDEPENDENT_AMBULATORY_CARE_PROVIDER_SITE_OTHER): Payer: Medicare Other | Admitting: Pharmacist

## 2017-03-06 DIAGNOSIS — Z5181 Encounter for therapeutic drug level monitoring: Secondary | ICD-10-CM

## 2017-03-06 DIAGNOSIS — Z7901 Long term (current) use of anticoagulants: Secondary | ICD-10-CM

## 2017-03-06 DIAGNOSIS — I82402 Acute embolism and thrombosis of unspecified deep veins of left lower extremity: Secondary | ICD-10-CM | POA: Diagnosis not present

## 2017-03-06 DIAGNOSIS — Z86718 Personal history of other venous thrombosis and embolism: Secondary | ICD-10-CM

## 2017-03-06 LAB — POCT INR: INR: 1.5

## 2017-03-06 NOTE — Progress Notes (Signed)
I have reviewed the coumadin clinic note.  Patient is on [facility] for VTE, INR low and coumadin was increased.

## 2017-03-06 NOTE — Progress Notes (Signed)
Anticoagulation Management Caroline [name] is a 72 y.o. female who reports to the clinic for monitoring of warfarin treatment.    Indication: DVT Duration: indefinite Supervising physician: Debe Coder  Anticoagulation Clinic Visit History: Patient does not report signs/symptoms of bleeding or thromboembolism  Other recent changes: No changes reported in diet, medications, lifestyle Anticoagulation Episode Summary    Current INR goal:   2.0-3.0  TTR:   84.2 % (2.1 y)  Next INR check:   03/27/2017  INR from last check:   1.5! 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 Penicillins Anaphylaxis, Hives, Swelling and Rash    Has patient had a PCN reaction causing immediate rash, facial/tongue/throat swelling, SOB or lightheadedness with hypotension: Yes Has patient had a PCN reaction causing severe rash involving mucus membranes or skin necrosis: Yes Has patient had a PCN reaction that required hospitalization Yes Has patient had a PCN reaction occurring within the last 10 years: No If all of the above answers are "NO", then may proceed with Cephalosporin use.   . Cabbage Itching  . Keflex [Cephalexin] Hives    And feeling of throat tightness  . Shellfish Allergy Swelling  . Tomato Swelling  . Latex Itching and Rash   Prior to Admission medications   Medication Sig Start Date End Date Taking? Authorizing Provider  albuterol (PROVENTIL) (2.5 MG/3ML) 0.083% nebulizer solution Take 3 mLs (2.5 mg total) by nebulization every 6 (six) hours as needed for wheezing or shortness of breath. 02/08/17  Yes Molt, Bethany, DO  budesonide-formoterol (SYMBICORT) 160-4.5 MCG/ACT inhaler Inhale 2 puffs into the  lungs 2 (two) times daily. 02/08/17  Yes Molt, Bethany, DO  calcium citrate-vitamin D (CITRACAL+D) 315-200 MG-UNIT tablet Take 2 tablets daily by mouth. 11/14/16  Yes [name], MD  clobetasol cream (TEMOVATE) 0.05 % Apply 1 application topically 2 (two) times daily as needed. 03/01/17  Yes [provider]  fluticasone (FLONASE) 50 MCG/ACT nasal spray PLACE 2 SPRAYS DAILY INTO BOTH NOSTRILS. 12/21/16  Yes Molt, Bethany, DO  furosemide (LASIX) 40 MG tablet Take 1 tablet (40 mg total) by mouth every other day. 11/29/16  Yes Molt, Bethany, DO  losartan (COZAAR) 100 MG tablet Take 1 tablet (100 mg total) by mouth daily. 11/29/16  Yes Molt, Bethany, DO  Magnesium Oxide 400 MG CAPS Take 1 capsule (400 mg total) by mouth 2 (two) times daily. 01/26/17  Yes Camnitz, Will [name], MD  meclizine (ANTIVERT) 25 MG tablet Take 1 tablet (25 mg total) 3 (three) times daily as needed by mouth. 11/23/16  Yes Molt, Bethany, DO  montelukast (SINGULAIR) 10 MG tablet Take 1 tablet (10 mg total) at bedtime by mouth. 11/14/16  Yes [name], MD  pantoprazole (PROTONIX) 40 MG tablet Take 1 tablet (40 mg total) by mouth daily. 11/29/16  Yes Molt, Bethany, DO  pravastatin (PRAVACHOL) 80 MG tablet TAKE 1 TABLET (80 MG TOTAL) BY MOUTH DAILY. 11/29/16  Yes Molt, Bethany, DO  RESTASIS MULTIDOSE 0.05 % ophthalmic emulsion Place 1 drop into both eyes 2 (two) times daily. Use once daily 02/08/17  Yes  Molt, Bethany, DO  warfarin (COUMADIN) 4 MG tablet TAKE 1 TABLET BY MOUTH DAILY--EXCEPT ON WEDNESDAYS--TAKE ONLY 1/2 TABLET ON WEDNESDAYS. Patient taking differently: 2mg  by mouth once daily on Wednesdays, 4mg  by mouth once daily all other days of week 09/06/16  Yes Molt, Bethany, DO  carvedilol (COREG) 12.5 MG tablet Take 1 tablet (12.5 mg total) by mouth 2 (two) times daily. 11/29/16 02/27/17  Molt, Toma Copier, DO   Past Medical History:  Diagnosis Date  . Arthritis   . Asthma   . Bronchitis   . CHF (congestive heart  failure) (HCC)   . DVT, lower extremity, recurrent (HCC)    Patient had unprovoked PE on 2002 and DVT in right lower extremety 2008.  [name] GERD (gastroesophageal reflux disease)   . History of kidney stones   . Hypertension   . PE (pulmonary embolism)    Patient had unprovoked PE on 2002  . PONV (postoperative nausea and vomiting)   . Vertigo    Social History   Socioeconomic History  . Marital status: Divorced    Spouse name: Not on file  . Number of children: Not on file  . Years of education: Not on file  . Highest education level: Not on file  Social Needs  . Financial resource strain: Not on file  . Food insecurity - worry: Not on file  . Food insecurity - inability: Not on file  . Transportation needs - medical: Not on file  . Transportation needs - non-medical: Not on file  Occupational History  . Not on file  Tobacco Use  . Smoking status: Former Smoker    Types: Cigarettes    Last attempt to quit: 09/18/1988    Years since quitting: 28.4  . Smokeless tobacco: Never Used  Substance and Sexual Activity  . Alcohol use: No  . Drug use: No  . Sexual activity: No    Birth control/protection: None  Other Topics Concern  . Not on file  Social History Narrative  . Not on file   Family History  Problem Relation Age of Onset  . Cancer Mother   . Hypertension Sister   . Hypertension Brother   . Diabetes Brother   . Hypertension Sister   . Colon cancer Other   . Heart Problems Other 34       sister's child, open heart surgery    ASSESSMENT Recent Results: The most recent result is correlated with 13.5 mg per week: Lab Results  Component Value Date   INR 1.5 03/06/2017   INR 3.3 02/13/2017   INR 4.20 02/06/2017    Anticoagulation Dosing: Description   Take 1 tablet of your blue-colored 4mg  strength warfarin tablets by mouth once-daily at [facility] on Mondays, Wednesdays, and Fridays; all other days, take only 1/2 tablet.      INR today:  Subtherapeutic  PLAN Weekly dose was increased by 11% to 15 mg per week  Patient Instructions  Patient instructed to take medications as defined in the Anti-coagulation Track section of this encounter.  Patient instructed to take today's dose.  Patient instructed to take 1 tablet of your blue-colored 4mg  strength warfarin tablets by mouth once-daily at [facility] on Mondays, Wednesdays, and Fridays; all other days, take only 1/2 tablet.  Patient verbalized understanding of these instructions.  Patient advised to contact clinic or seek medical attention if signs/symptoms of bleeding or thromboembolism occur.  Patient verbalized understanding by repeating back information and was advised to contact me if further medication-related questions arise.  Patient was also provided an information handout.  Follow-up Return in 3 weeks (on 03/27/2017) for Follow up INR at 0945.  Roderic Scarce Zigmund Daniel, PharmD PGY1 Pharmacy Resident Pager: [phone]  15 minutes spent face-to-face with the patient during the encounter. 50% of time spent on education. 50% of time was spent on point of care INR test, results interpretation, dose adjustment, and documentation in [facility] and [email or web address].

## 2017-03-06 NOTE — Patient Instructions (Signed)
Patient instructed to take medications as defined in the Anti-coagulation Track section of this encounter.  Patient instructed to take today's dose.  Patient instructed to take 1 tablet of your blue-colored 4mg  strength warfarin tablets by mouth once-daily at [facility] on Mondays, Wednesdays, and Fridays; all other days, take only 1/2 tablet.  Patient verbalized understanding of these instructions.

## 2017-03-25 ENCOUNTER — Other Ambulatory Visit: Payer: Self-pay | Admitting: Internal Medicine

## 2017-03-27 ENCOUNTER — Ambulatory Visit (INDEPENDENT_AMBULATORY_CARE_PROVIDER_SITE_OTHER): Payer: Medicare Other | Admitting: Pharmacist

## 2017-03-27 ENCOUNTER — Encounter (INDEPENDENT_AMBULATORY_CARE_PROVIDER_SITE_OTHER): Payer: Self-pay

## 2017-03-27 DIAGNOSIS — Z7901 Long term (current) use of anticoagulants: Secondary | ICD-10-CM

## 2017-03-27 DIAGNOSIS — Z5181 Encounter for therapeutic drug level monitoring: Secondary | ICD-10-CM | POA: Diagnosis not present

## 2017-03-27 DIAGNOSIS — Z86718 Personal history of other venous thrombosis and embolism: Secondary | ICD-10-CM | POA: Diagnosis not present

## 2017-03-27 DIAGNOSIS — I82402 Acute embolism and thrombosis of unspecified deep veins of left lower extremity: Secondary | ICD-10-CM | POA: Diagnosis not present

## 2017-03-27 LAB — POCT INR: INR: 2.5

## 2017-03-27 NOTE — Telephone Encounter (Signed)
Received fax from pt's pharmacy with the following message regarding Restasis   "Product backordered/unavailable: multidose on back order...please consider Retasis unit dose"  Will send to pcp for review, please advise.Caroline Spittle Cassady3/18/20193:21 PM

## 2017-03-27 NOTE — Patient Instructions (Signed)
Patient instructed to take medications as defined in the Anti-coagulation Track section of this encounter.  Patient instructed to take today's dose.  Patient instructed to take 1 tablet of your blue-colored 4mg strength warfarin tablets by mouth once-daily at 6PM on Mondays, Wednesdays, and Fridays; all other days, take only 1/2 tablet.  Patient verbalized understanding of these instructions. 

## 2017-03-27 NOTE — Progress Notes (Signed)
Anticoagulation Management Caroline [name] is a 72 y.o. female who reports to the clinic for monitoring of warfarin treatment.    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 Penicillins Anaphylaxis, Hives, Swelling and Rash    Has patient had a PCN reaction causing immediate rash, facial/tongue/throat swelling, SOB or lightheadedness with hypotension: Yes Has patient had a PCN reaction causing severe rash involving mucus membranes or skin necrosis: Yes Has patient had a PCN reaction that required hospitalization Yes Has patient had a PCN reaction occurring within the last 10 years: No If all of the above answers are "NO", then may proceed with Cephalosporin use.   . Cabbage Itching  . Keflex [Cephalexin] Hives    And feeling of throat tightness  . Shellfish Allergy Swelling  . Tomato Swelling  . Latex Itching and Rash   Prior to Admission medications   Medication Sig Start Date End Date Taking? Authorizing Provider  albuterol (PROVENTIL) (2.5 MG/3ML) 0.083% nebulizer solution Take 3 mLs (2.5 mg total) by nebulization every 6 (six) hours as needed for wheezing or shortness of breath. 02/08/17  Yes Molt, Bethany, DO  budesonide-formoterol (SYMBICORT) 160-4.5 MCG/ACT inhaler Inhale 2 puffs into  the lungs 2 (two) times daily. 02/08/17  Yes Molt, Bethany, DO  calcium citrate-vitamin D (CITRACAL+D) 315-200 MG-UNIT tablet Take 2 tablets daily by mouth. 11/14/16  Yes [name], MD  clobetasol cream (TEMOVATE) 0.05 % Apply 1 application topically 2 (two) times daily as needed. 03/01/17  Yes [provider]  fluticasone (FLONASE) 50 MCG/ACT nasal spray PLACE 2 SPRAYS DAILY INTO BOTH NOSTRILS. 12/21/16  Yes Molt, Bethany, DO  furosemide (LASIX) 40 MG tablet Take 1 tablet (40 mg total) by mouth every other day. 11/29/16  Yes Molt, Bethany, DO  losartan (COZAAR) 100 MG tablet Take 1 tablet (100 mg total) by mouth daily. 11/29/16  Yes Molt, Bethany, DO  Magnesium Oxide 400 MG CAPS Take 1 capsule (400 mg total) by mouth 2 (two) times daily. 01/26/17  Yes Camnitz, Will [name], MD  meclizine (ANTIVERT) 25 MG tablet Take 1 tablet (25 mg total) 3 (three) times daily as needed by mouth. 11/23/16  Yes Molt, Bethany, DO  montelukast (SINGULAIR) 10 MG tablet Take 1 tablet (10 mg total) at bedtime by mouth. 11/14/16  Yes [name], MD  pantoprazole (PROTONIX) 40 MG tablet Take 1 tablet (40 mg total) by mouth daily. 11/29/16  Yes Molt, Bethany, DO  pravastatin (PRAVACHOL) 80 MG tablet TAKE 1 TABLET (80 MG TOTAL) BY MOUTH DAILY. 11/29/16  Yes Molt, Bethany, DO  RESTASIS MULTIDOSE 0.05 % ophthalmic emulsion Place 1 drop into both eyes 2 (two) times daily. Use once daily 02/08/17  Yes Molt, Bethany, DO  warfarin (COUMADIN) 4 MG tablet TAKE 1 TABLET BY MOUTH DAILY--EXCEPT ON WEDNESDAYS--TAKE ONLY 1/2 TABLET ON WEDNESDAYS. Patient taking differently: 2mg  by mouth once daily on Wednesdays, 4mg  by mouth once daily all other days of week 09/06/16  Yes Molt, Bethany, DO  carvedilol (COREG) 12.5 MG tablet Take 1 tablet (12.5 mg total) by mouth 2 (two) times daily. 11/29/16 02/27/17  Molt, Toma Copier, DO   Past Medical History:  Diagnosis Date  . Arthritis   . Asthma   . Bronchitis   . CHF (congestive heart  failure) (HCC)   . DVT, lower extremity, recurrent (HCC)    Patient had unprovoked PE on 2002 and DVT in right lower extremety 2008.  [name] GERD (gastroesophageal reflux disease)   . History of kidney stones   . Hypertension   . PE (pulmonary embolism)    Patient had unprovoked PE on 2002  . PONV (postoperative nausea and vomiting)   . Vertigo    Social History   Socioeconomic History  . Marital status: Divorced    Spouse name: Not on file  . Number of children: Not on file  . Years of education: Not on file  . Highest education level: Not on file  Social Needs  . Financial resource strain: Not on file  . Food insecurity - worry: Not on file  . Food insecurity - inability: Not on file  . Transportation needs - medical: Not on file  . Transportation needs - non-medical: Not on file  Occupational History  . Not on file  Tobacco Use  . Smoking status: Former Smoker    Types: Cigarettes    Last attempt to quit: 09/18/1988    Years since quitting: 28.5  . Smokeless tobacco: Never Used  Substance and Sexual Activity  . Alcohol use: No  . Drug use: No  . Sexual activity: No    Birth control/protection: None  Other Topics Concern  . Not on file  Social History Narrative  . Not on file   Family History  Problem Relation Age of Onset  . Cancer Mother   . Hypertension Sister   . Hypertension Brother   . Diabetes Brother   . Hypertension Sister   . Colon cancer Other   . Heart Problems Other 34       sister's child, open heart surgery    ASSESSMENT Recent Results: The most recent result is correlated with 15 mg per week: Lab Results  Component Value Date   INR 2.5 03/27/2017   INR 1.5 03/06/2017   INR 3.3 02/13/2017    Anticoagulation Dosing: Description   Take 1 tablet of your blue-colored 4mg  strength warfarin tablets by mouth once-daily at [facility] on Mondays, Wednesdays, and Fridays; all other days, take only 1/2 tablet.      INR today: Therapeutic  PLAN Weekly  dose was unchanged  Patient Instructions  Patient instructed to take medications as defined in the Anti-coagulation Track section of this encounter.  Patient instructed to take today's dose. Patient instructed to take 1 tablet of your blue-colored 4mg  strength warfarin tablets by mouth once-daily at [facility] on Mondays, Wednesdays, and Fridays; all other days, take only 1/2 tablet.  Patient verbalized understanding of these instructions.   Patient advised to contact clinic or seek medical attention if signs/symptoms of bleeding or thromboembolism occur.  Patient verbalized understanding by repeating back information and was advised to contact me if further medication-related questions arise. Patient was also provided an information  handout.  Follow-up Return in 3 weeks (on 04/17/2017) for Follow up INR at 0915.  Roderic Scarce Zigmund Daniel, PharmD PGY1 Pharmacy Resident Pager: [phone]  15 minutes spent face-to-face with the patient during the encounter. 50% of time spent on education. 50% of time was spent on point of care INR testing, results interpretation, dose adjustment, and documentation in [facility] and [email or web address].

## 2017-03-27 NOTE — Progress Notes (Signed)
INTERNAL MEDICINE TEACHING ATTENDING ADDENDUM - Eilene Voigt M.D  Duration- indefinite, Indication- PE, DVT, INR- therapeutic. Agree with pharmacy recommendations as outlined in their note.      

## 2017-03-28 ENCOUNTER — Other Ambulatory Visit: Payer: Self-pay | Admitting: *Deleted

## 2017-03-28 DIAGNOSIS — I824Y9 Acute embolism and thrombosis of unspecified deep veins of unspecified proximal lower extremity: Secondary | ICD-10-CM

## 2017-03-28 MED ORDER — WARFARIN SODIUM 4 MG PO TABS: ORAL_TABLET | ORAL | 2 refills | Status: DC

## 2017-04-06 ENCOUNTER — Encounter: Payer: Self-pay | Admitting: Internal Medicine

## 2017-04-06 ENCOUNTER — Emergency Department (HOSPITAL_COMMUNITY): Payer: Medicare Other

## 2017-04-06 ENCOUNTER — Other Ambulatory Visit: Payer: Self-pay

## 2017-04-06 ENCOUNTER — Encounter (HOSPITAL_COMMUNITY): Payer: Self-pay | Admitting: Emergency Medicine

## 2017-04-06 ENCOUNTER — Ambulatory Visit: Payer: Medicare Other

## 2017-04-06 DIAGNOSIS — Z7901 Long term (current) use of anticoagulants: Secondary | ICD-10-CM | POA: Insufficient documentation

## 2017-04-06 DIAGNOSIS — J45909 Unspecified asthma, uncomplicated: Secondary | ICD-10-CM | POA: Diagnosis not present

## 2017-04-06 DIAGNOSIS — R0789 Other chest pain: Secondary | ICD-10-CM | POA: Insufficient documentation

## 2017-04-06 DIAGNOSIS — I5042 Chronic combined systolic (congestive) and diastolic (congestive) heart failure: Secondary | ICD-10-CM | POA: Diagnosis not present

## 2017-04-06 DIAGNOSIS — Z79899 Other long term (current) drug therapy: Secondary | ICD-10-CM | POA: Diagnosis not present

## 2017-04-06 DIAGNOSIS — R110 Nausea: Secondary | ICD-10-CM | POA: Diagnosis not present

## 2017-04-06 DIAGNOSIS — R1013 Epigastric pain: Secondary | ICD-10-CM | POA: Diagnosis not present

## 2017-04-06 DIAGNOSIS — M549 Dorsalgia, unspecified: Secondary | ICD-10-CM | POA: Diagnosis not present

## 2017-04-06 DIAGNOSIS — Z91040 Latex allergy status: Secondary | ICD-10-CM | POA: Insufficient documentation

## 2017-04-06 DIAGNOSIS — R079 Chest pain, unspecified: Secondary | ICD-10-CM | POA: Diagnosis not present

## 2017-04-06 DIAGNOSIS — I110 Hypertensive heart disease with heart failure: Secondary | ICD-10-CM | POA: Insufficient documentation

## 2017-04-06 DIAGNOSIS — Z87891 Personal history of nicotine dependence: Secondary | ICD-10-CM | POA: Diagnosis not present

## 2017-04-06 LAB — BASIC METABOLIC PANEL
Anion gap: 12 (ref 5–15)
BUN: 7 mg/dL (ref 6–20)
CALCIUM: 8.9 mg/dL (ref 8.9–10.3)
CO2: 27 mmol/L (ref 22–32)
CREATININE: 0.95 mg/dL (ref 0.44–1.00)
Chloride: 102 mmol/L (ref 101–111)
GFR calc Af Amer: >60 mL/min (ref >60)
GFR calc non Af Amer: 59 mL/min — ABNORMAL LOW (ref >60)
GLUCOSE: 135 mg/dL — AB (ref 65–99)
Potassium: 3.5 mmol/L (ref 3.5–5.1)
Sodium: 141 mmol/L (ref 135–145)

## 2017-04-06 LAB — CBC
HCT: 36.4 % (ref 36.0–46.0)
Hemoglobin: 11.2 g/dL — ABNORMAL LOW (ref 12.0–15.0)
MCH: 25.1 pg — AB (ref 26.0–34.0)
MCHC: 30.8 g/dL (ref 30.0–36.0)
MCV: 81.6 fL (ref 78.0–100.0)
PLATELETS: 215 10*3/uL (ref 150–400)
RBC: 4.46 MIL/uL (ref 3.87–5.11)
RDW: 15.8 % — ABNORMAL HIGH (ref 11.5–15.5)
WBC: 6.2 10*3/uL (ref 4.0–10.5)

## 2017-04-06 LAB — I-STAT TROPONIN, ED: Troponin i, poc: 0 ng/mL (ref 0.00–0.08)

## 2017-04-06 NOTE — ED Triage Notes (Signed)
Pt reports sensation of heartburn for 3-4 days, reports headache as well. Reports her granddaughter brought a cold home.

## 2017-04-07 ENCOUNTER — Emergency Department (HOSPITAL_COMMUNITY)
Admission: EM | Admit: 2017-04-07 | Discharge: 2017-04-07 | Disposition: A | Discharge status: Home / Self Care | Payer: Medicare Other | Attending: Emergency Medicine | Admitting: Emergency Medicine

## 2017-04-07 DIAGNOSIS — R0789 Other chest pain: Secondary | ICD-10-CM

## 2017-04-07 DIAGNOSIS — R1013 Epigastric pain: Secondary | ICD-10-CM

## 2017-04-07 LAB — HEPATIC FUNCTION PANEL
ALT: 20 U/L (ref 14–54)
AST: 17 U/L (ref 15–41)
Albumin: 3.1 g/dL — ABNORMAL LOW (ref 3.5–5.0)
Alkaline Phosphatase: 62 U/L (ref 38–126)
Total Bilirubin: 0.5 mg/dL (ref 0.3–1.2)
Total Protein: 6.6 g/dL (ref 6.5–8.1)

## 2017-04-07 LAB — I-STAT TROPONIN, ED: Troponin i, poc: 0 ng/mL (ref 0.00–0.08)

## 2017-04-07 LAB — LIPASE, BLOOD: LIPASE: 30 U/L (ref 11–51)

## 2017-04-07 MED ORDER — GI COCKTAIL ~~LOC~~
30.0000 mL | Freq: Once | ORAL | Status: AC
  Administered 2017-04-07: 30 mL via ORAL
  Filled 2017-04-07: qty 30

## 2017-04-07 MED ORDER — FAMOTIDINE 20 MG PO TABS: 20.0000 mg | ORAL_TABLET | Freq: Two times a day (BID) | ORAL | 0 refills | Status: DC

## 2017-04-07 NOTE — Discharge Instructions (Signed)
Please read and follow all provided instructions.  Your diagnoses today include:  1. Dyspepsia   2. Atypical chest pain     Tests performed today include:  An EKG of your heart  A chest x-ray  Cardiac enzymes - a blood test for heart muscle damage  Blood counts and electrolytes  Test for liver and pancreas -normal  Vital signs. See below for your results today.   Medications prescribed:   Pepcid (famotidine) - antihistamine  You can find this medication over-the-counter.   DO NOT exceed:   20mg  Pepcid every 12 hours  Take any prescribed medications only as directed.  Follow-up instructions: Please follow-up with your primary care provider as soon as you can for further evaluation of your symptoms.   Return instructions:  SEEK IMMEDIATE MEDICAL ATTENTION IF:  You have severe chest pain, especially if the pain is crushing or pressure-like and spreads to the arms, back, neck, or jaw, or if you have sweating, nausea (feeling sick to your stomach), or shortness of breath. THIS IS AN EMERGENCY. Don't wait to see if the pain will go away. Get medical help at once. Call 911 or 0 (operator). DO NOT drive yourself to the hospital.   Your chest pain gets worse and does not go away with rest.   You have an attack of chest pain lasting longer than usual, despite rest and treatment with the medications your caregiver has prescribed.   You wake from sleep with chest pain or shortness of breath.  You feel dizzy or faint.  You have chest pain not typical of your usual pain for which you originally saw your caregiver.   You have any other emergent concerns regarding your health.  Additional Information: Chest pain comes from many different causes. Your caregiver has diagnosed you as having chest pain that is not specific for one problem, but does not require admission.  You are at low risk for an acute heart condition or other serious illness.   Your vital signs today were: BP  (!) 115/53 (BP Location: Right Arm)    Pulse 73    Temp 98.3 F (36.8 C) (Oral)    Resp 16    Ht 5\' 3"  (1.6 m)    Wt 127 kg (280 lb)    SpO2 97%    BMI 49.60 kg/m  If your blood pressure (BP) was elevated above 135/85 this visit, please have this repeated by your doctor within one month. --------------

## 2017-04-07 NOTE — ED Provider Notes (Signed)
[facility] EMERGENCY DEPARTMENT Provider Note   CSN: [id number] Arrival date & time: 04/06/17  2107     History   Chief Complaint Chief Complaint  Patient presents with  . Heartburn  . Headache    HPI Caroline [name] is a 72 y.o. female.   Patient with history of DVT/PE on Coumadin, congestive heart failure on Lasix, no previous abdominal surgeries --presents the emergency department with complaints of an unsettled feeling in her stomach with associated nausea, 2 episodes of nonbloody loose stools, any burning pain into her chest for the past 3 days.  Patient reports that the symptoms are worse with food such as when she ate some hamburger the other day and better when she takes medicine for indigestion.  She has not had any associated vomiting or fevers.  She has not had any focal right upper quadrant pain.  Pain does radiate to her mid back bilaterally, not between her shoulder blades.  No urinary symptoms.  No weakness, numbness, or tingling in the legs.  No strokelike symptoms.  Reports that her most recent INR was 2.5.  The onset of this condition was acute. The course is improving.     Past Medical History:  Diagnosis Date  . Arthritis   . Asthma   . Bronchitis   . CHF (congestive heart failure) (HCC)   . DVT, lower extremity, recurrent (HCC)    Patient had unprovoked PE on 2002 and DVT in right lower extremety 2008.  [name] GERD (gastroesophageal reflux disease)   . History of kidney stones   . Hypertension   . PE (pulmonary embolism)    Patient had unprovoked PE on 2002  . PONV (postoperative nausea and vomiting)   . Vertigo     Patient Active Problem List   Diagnosis Date Noted  . 'light-for-dates' infant with signs of fetal malnutrition 04/06/2017  . History of Clostridium difficile colitis 12/31/2016  . Anticoagulated on warfarin   . Orthostatic hypotension   . Lipodermatosclerosis 10/11/2016  . Morbid obesity with BMI of 45.0-49.9, adult (HCC)  05/02/2016  . Chronic combined systolic and diastolic CHF, NYHA class 1 (HCC) 09/21/2015  . DVT, lower extremity, recurrent (HCC)   . Vertigo 09/13/2011  . Allergic rhinitis 06/21/2006  . Hyperlipidemia 06/10/2006  . Essential hypertension 06/10/2006  . Moderate persistent asthma 06/10/2006    Past Surgical History:  Procedure Laterality Date  . CYSTOSCOPY W/ URETERAL STENT PLACEMENT Right 09/16/2015   Procedure: CYSTOSCOPY WITH RETROGRADE PYELOGRAM/URETERAL STENT PLACEMENT;  Surgeon: Alfredo Martinez, MD;  Location: [facility] OR;  Service: Urology;  Laterality: Right;  . CYSTOSCOPY WITH RETROGRADE PYELOGRAM, URETEROSCOPY AND STENT PLACEMENT Right 12/11/2015   Procedure: RIGHT URETEROSCOPY/HOLMIUM LASER LITHOTRIPSY AND STONE REMOVAL removal and placement of double j stent;  Surgeon: Crist Fat, MD;  Location: [facility] OR[facility];  Service: Urology;  Laterality: Right;  . HOLMIUM LASER APPLICATION Right 12/11/2015   Procedure: HOLMIUM LASER APPLICATION;  Surgeon: Crist Fat, MD;  Location: [facility] ORS;  Service: Urology;  Laterality: Right;     OB History   None      Home Medications    Prior to Admission medications   Medication Sig Start Date End Date Taking? Authorizing Provider  albuterol (PROVENTIL) (2.5 MG/3ML) 0.083% nebulizer solution Take 3 mLs (2.5 mg total) by nebulization every 6 (six) hours as needed for wheezing or shortness of breath. 02/08/17   Molt, Bethany, DO  budesonide-formoterol (SYMBICORT) 160-4.5 MCG/ACT inhaler Inhale 2 puffs into the lungs  2 (two) times daily. 02/08/17   Molt, Bethany, DO  calcium citrate-vitamin D (CITRACAL+D) 315-200 MG-UNIT tablet Take 2 tablets daily by mouth. 11/14/16   [name], MD  carvedilol (COREG) 12.5 MG tablet Take 1 tablet (12.5 mg total) by mouth 2 (two) times daily. 11/29/16 02/27/17  Molt, Bethany, DO  clobetasol cream (TEMOVATE) 0.05 % Apply 1 application topically 2 (two) times daily as needed. 03/01/17   [provider]    fluticasone (FLONASE) 50 MCG/ACT nasal spray PLACE 2 SPRAYS DAILY INTO BOTH NOSTRILS. 12/21/16   Molt, Bethany, DO  furosemide (LASIX) 40 MG tablet Take 1 tablet (40 mg total) by mouth every other day. 11/29/16   Molt, Bethany, DO  losartan (COZAAR) 100 MG tablet Take 1 tablet (100 mg total) by mouth daily. 11/29/16   Molt, Bethany, DO  Magnesium Oxide 400 MG CAPS Take 1 capsule (400 mg total) by mouth 2 (two) times daily. 01/26/17   Camnitz, Andree Coss, MD  meclizine (ANTIVERT) 25 MG tablet Take 1 tablet (25 mg total) 3 (three) times daily as needed by mouth. 11/23/16   Molt, Bethany, DO  montelukast (SINGULAIR) 10 MG tablet Take 1 tablet (10 mg total) at bedtime by mouth. 11/14/16   [name], MD  pantoprazole (PROTONIX) 40 MG tablet Take 1 tablet (40 mg total) by mouth daily. 11/29/16   Molt, Bethany, DO  pravastatin (PRAVACHOL) 80 MG tablet TAKE 1 TABLET (80 MG TOTAL) BY MOUTH DAILY. 11/29/16   Molt, Bethany, DO  RESTASIS MULTIDOSE 0.05 % ophthalmic emulsion PLACE 1 DROP INTO BOTH EYES 2 (TWO) TIMES DAILY. USE ONCE DAILY 03/27/17   Molt, Bethany, DO  warfarin (COUMADIN) 4 MG tablet Take 1 tablet (4mg ) by mouth once-daily at 6pm on Mondays, Wednesdays and Fridays. On all other days, take only 1/2 tablet. 03/28/17   Molt, Bethany, DO    Family History Family History  Problem Relation Age of Onset  . Cancer Mother   . Hypertension Sister   . Hypertension Brother   . Diabetes Brother   . Hypertension Sister   . Colon cancer Other   . Heart Problems Other 34       sister's child, open heart surgery    Social History Social History   Tobacco Use  . Smoking status: Former Smoker    Types: Cigarettes    Last attempt to quit: 09/18/1988    Years since quitting: 28.5  . Smokeless tobacco: Never Used  Substance Use Topics  . Alcohol use: No  . Drug use: No     Allergies   Penicillins; Cabbage; Keflex [cephalexin]; Shellfish allergy; Tomato; and Latex   Review of  Systems Review of Systems  Constitutional: Negative for diaphoresis and fever.  HENT: Negative for rhinorrhea and sore throat.   Eyes: Negative for redness.  Respiratory: Negative for cough and shortness of breath.   Cardiovascular: Positive for chest pain ('burning in chest'). Negative for palpitations and leg swelling.  Gastrointestinal: Positive for abdominal pain and nausea. Negative for diarrhea and vomiting.  Genitourinary: Negative for dysuria.  Musculoskeletal: Positive for back pain. Negative for myalgias and neck pain.  Skin: Negative for rash.  Neurological: Negative for syncope, light-headedness and headaches.  Psychiatric/Behavioral: The patient is not nervous/anxious.      Physical Exam Updated Vital Signs BP (!) 115/53 (BP Location: Right Arm)   Pulse 73   Temp 98.3 F (36.8 C) (Oral)   Resp 16   Ht 5\' 3"  (1.6 m)   Wt 127  kg (280 lb)   SpO2 97%   BMI 49.60 kg/m    Physical Exam  Constitutional: She appears well-developed and well-nourished.  HENT:  Head: Normocephalic and atraumatic.  Mouth/Throat: Oropharynx is clear and moist and mucous membranes are normal. Mucous membranes are not dry.  Eyes: Conjunctivae are normal. Right eye exhibits no discharge. Left eye exhibits no discharge.  Neck: Trachea normal and normal range of motion. Neck supple. Normal carotid pulses and no JVD present. No muscular tenderness present. Carotid bruit is not present. No tracheal deviation present.  Cardiovascular: Normal rate, regular rhythm, S1 normal, S2 normal, normal heart sounds and intact distal pulses. Exam reveals no decreased pulses.  No murmur heard. Pulses:      Radial pulses are 2+ on the right side, and 2+ on the left side.  Pulmonary/Chest: Effort normal and breath sounds normal. No respiratory distress. She has no wheezes. She exhibits no tenderness.  Abdominal: Soft. Normal aorta and bowel sounds are normal. There is no tenderness. There is no rebound and no  guarding.  Musculoskeletal: Normal range of motion.  Neurological: She is alert.  Skin: Skin is warm and dry. She is not diaphoretic. No cyanosis. No pallor.  Psychiatric: She has a normal mood and affect.  Nursing note and vitals reviewed.    ED Treatments / Results  Labs (all labs ordered are listed, but only abnormal results are displayed) Labs Reviewed  BASIC METABOLIC PANEL - Abnormal; Notable for the following components:      Result Value   Glucose, Bld 135 (*)    GFR calc non Af Amer 59 (*)    All other components within normal limits  CBC - Abnormal; Notable for the following components:   Hemoglobin 11.2 (*)    MCH 25.1 (*)    RDW 15.8 (*)    All other components within normal limits  LIPASE, BLOOD  HEPATIC FUNCTION PANEL  I-STAT TROPONIN, ED  I-STAT TROPONIN, ED    EKG None  Radiology Dg Chest 2 View  Result Date: 04/06/2017 CLINICAL DATA:  72 year old female with chest pain shortness of breath and nausea for 1 day. EXAM: CHEST - 2 VIEW COMPARISON:  01/11/2017 chest radiographs and earlier. FINDINGS: Seated upright AP and lateral views of the chest. Stable cardiomegaly and mediastinal contours. Stable lung volumes. Mild diffuse increased interstitial markings are less apparent today than on prior studies. There is no pneumothorax, pulmonary edema, pleural effusion or acute pulmonary opacity. No acute osseous abnormality identified. Negative visible bowel gas pattern. IMPRESSION: Stable cardiomegaly. No acute cardiopulmonary abnormality. Electronically Signed   By: Odessa Fleming M.D.   On: 04/06/2017 22:07    Procedures Procedures (including critical care time)  Medications Ordered in ED Medications  gi cocktail (Maalox,Lidocaine,Donnatal) (has no administration in time range)     Initial Impression / Assessment and Plan / ED Course  I have reviewed the triage vital signs and the nursing notes.  Pertinent labs & imaging results that were available during my care  of the patient were reviewed by me and considered in my medical decision making (see chart for details).     Patient seen and examined. Work-up reviewed.  Medications ordered.    Vital signs reviewed and are as follows: BP (!) 115/53 (BP Location: Right Arm)   Pulse 73   Temp 98.3 F (36.8 C) (Oral)   Resp 16   Ht 5\' 3"  (1.6 m)   Wt 127 kg (280 lb)   SpO2 97%  BMI 49.60 kg/m    Given patient's symptoms I would like to check hepatic function test and lipase, repeat troponin and EKG.  Patient does not have significant shortness of breath or tachycardia.  I do not suspect that she has recurrent PE today.  No focal tenderness at time of exam the patient reports that her symptoms are actually doing much better now.  Anticipate discharge to home if remaining workup is reassuring.  10:26 AM patient states that she feels a bit better after GI cocktail.  Repeat EKG, second troponin, hepatic function panel and lipase were unremarkable.  Patient comfortable discharged home at this time. Rx pepcid in addition to previously prescribed pantoprazole.  10:27 AM Patient was counseled to return with severe chest pain, especially if the pain is crushing or pressure-like and spreads to the arms, back, neck, or jaw, or if they have sweating, nausea, or shortness of breath with the pain.ms.    Final Clinical Impressions(s) / ED Diagnoses   Final diagnoses:  Dyspepsia  Atypical chest pain   Patient with indigestion and a burning chest pain for the past several days.  Does seem GI in etiology as symptoms are worsened with food.  She has had some mild upper abdominal discomfort but no discrete pain.  Abdominal exam is reassuring at time of arrival.  Patient does have a history of unprovoked pulmonary embolism and is on Coumadin.  She does have some cardiac risk factors.  Cardiac workup undertaken here with 2- troponins and unchanged EKG during approximately 13-hour ED stay.  Low concern for ACS, PE,  dissection at this time.  Patient appears well and has appropriate PCP follow-up.     ED Discharge Orders        Ordered    famotidine (PEPCID) 20 MG tablet  2 times daily     04/07/17 1024      Renne Crigler, PA-C 04/07/17 1029  Jacalyn Lefevre, MD 04/07/17 1344

## 2017-04-17 ENCOUNTER — Ambulatory Visit (INDEPENDENT_AMBULATORY_CARE_PROVIDER_SITE_OTHER): Payer: Medicare Other | Admitting: Pharmacist

## 2017-04-17 DIAGNOSIS — I82402 Acute embolism and thrombosis of unspecified deep veins of left lower extremity: Secondary | ICD-10-CM

## 2017-04-17 DIAGNOSIS — Z7901 Long term (current) use of anticoagulants: Secondary | ICD-10-CM | POA: Diagnosis not present

## 2017-04-17 DIAGNOSIS — Z5181 Encounter for therapeutic drug level monitoring: Secondary | ICD-10-CM | POA: Diagnosis not present

## 2017-04-17 DIAGNOSIS — Z86718 Personal history of other venous thrombosis and embolism: Secondary | ICD-10-CM | POA: Diagnosis not present

## 2017-04-17 LAB — POCT INR: INR: 3.8

## 2017-04-17 NOTE — Patient Instructions (Signed)
Patient instructed to take medications as defined in the Anti-coagulation Track section of this encounter.  Patient instructed to HOLD today's dose.  Patient instructed NOT to take any warfarin today, Monday April 8th. On Tuesday, resume taking 1 tablet of your blue-colored 4mg  strength warfarin tablets by mouth once-daily at [facility] on Mondays, Wednesdays, and Fridays; all other days, take only 1/2 tablet.  Patient verbalized understanding of these instructions.

## 2017-04-17 NOTE — Progress Notes (Signed)
Anticoagulation Management Caroline [name] is a 72 y.o. female who reports to the clinic for monitoring of warfarin treatment.    Indication: DVT/PE Duration: indefinite Supervising physician: Carlynn Purl  Anticoagulation Clinic Visit History: Patient does not report signs/symptoms of bleeding or thromboembolism  Other recent changes: No changes reported in diet, medications, lifestyle Anticoagulation Episode Summary    Current INR goal:   2.0-3.0  TTR:   82.1 % (2.2 y)  Next INR check:   05/08/2017  INR from last check:   3.8! (04/17/2017)  Weekly max warfarin dose:     Target end date:     INR check location:   Anticoagulation Clinic  Preferred lab:     Send INR reminders to:   ANTICOAG IMP   Indications   DVT lower extremity recurrent (HCC) [I82.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 Penicillins Anaphylaxis, Hives, Swelling and Rash    Has patient had a PCN reaction causing immediate rash, facial/tongue/throat swelling, SOB or lightheadedness with hypotension: Yes Has patient had a PCN reaction causing severe rash involving mucus membranes or skin necrosis: Yes Has patient had a PCN reaction that required hospitalization Yes Has patient had a PCN reaction occurring within the last 10 years: No If all of the above answers are "NO", then may proceed with Cephalosporin use.   . Cabbage Itching  . Keflex [Cephalexin] Hives    And feeling of throat tightness  . Shellfish Allergy Swelling  . Tomato Swelling  . Latex Itching and Rash   Prior to Admission medications   Medication Sig Start Date End Date Taking? Authorizing Provider  albuterol (PROVENTIL) (2.5 MG/3ML) 0.083% nebulizer solution Take 3 mLs (2.5 mg total) by nebulization every 6 (six) hours as needed for wheezing or shortness of breath. 02/08/17  Yes [name], Bethany, DO  budesonide-formoterol (SYMBICORT) 160-4.5 MCG/ACT inhaler Inhale 2 puffs  into the lungs 2 (two) times daily. 02/08/17  Yes [name], Bethany, DO  calcium citrate-vitamin D (CITRACAL+D) 315-200 MG-UNIT tablet Take 2 tablets daily by mouth. 11/14/16  Yes Caroline Poisson, MD  clobetasol cream (TEMOVATE) 0.05 % Apply 1 application topically 2 (two) times daily as needed. 03/01/17  Yes [provider]  famotidine (PEPCID) 20 MG tablet Take 1 tablet (20 mg total) by mouth 2 (two) times daily. 04/07/17  Yes Caroline Crigler, PA-C  fluticasone (FLONASE) 50 MCG/ACT nasal spray PLACE 2 SPRAYS DAILY INTO BOTH NOSTRILS. 12/21/16  Yes [name], Bethany, DO  furosemide (LASIX) 40 MG tablet Take 1 tablet (40 mg total) by mouth every other day. 11/29/16  Yes [name], Bethany, DO  losartan (COZAAR) 100 MG tablet Take 1 tablet (100 mg total) by mouth daily. 11/29/16  Yes [name], Bethany, DO  Magnesium Oxide 400 MG CAPS Take 1 capsule (400 mg total) by mouth 2 (two) times daily. 01/26/17  Yes Camnitz, Will [name], MD  meclizine (ANTIVERT) 25 MG tablet Take 1 tablet (25 mg total) 3 (three) times daily as needed by mouth. 11/23/16  Yes [name], Bethany, DO  montelukast (SINGULAIR) 10 MG tablet Take 1 tablet (10 mg total) at bedtime by mouth. 11/14/16  Yes Caroline Poisson, MD  pantoprazole (PROTONIX) 40 MG tablet Take 1 tablet (40 mg total) by mouth daily. 11/29/16  Yes [name], Bethany, DO  pravastatin (PRAVACHOL) 80 MG tablet TAKE 1 TABLET (80 MG TOTAL) BY MOUTH DAILY. 11/29/16  Yes [name], Toma Copier,  DO  RESTASIS MULTIDOSE 0.05 % ophthalmic emulsion PLACE 1 DROP INTO BOTH EYES 2 (TWO) TIMES DAILY. USE ONCE DAILY 03/27/17  Yes [name], Bethany, DO  warfarin (COUMADIN) 4 MG tablet Take 1 tablet (4mg ) by mouth once-daily at 6pm on Mondays, Wednesdays and Fridays. On all other days, take only 1/2 tablet. 03/28/17  Yes [name], Bethany, DO  carvedilol (COREG) 12.5 MG tablet Take 1 tablet (12.5 mg total) by mouth 2 (two) times daily. 11/29/16 02/27/17  [name], Toma Copier, DO   Past Medical History:  Diagnosis Date  . Arthritis    . Asthma   . Bronchitis   . CHF (congestive heart failure) (HCC)   . DVT, lower extremity, recurrent (HCC)    Patient had unprovoked PE on 2002 and DVT in right lower extremety 2008.  [name] GERD (gastroesophageal reflux disease)   . History of kidney stones   . Hypertension   . PE (pulmonary embolism)    Patient had unprovoked PE on 2002  . PONV (postoperative nausea and vomiting)   . Vertigo    Social History   Socioeconomic History  . Marital status: Divorced    Spouse name: Not on file  . Number of children: Not on file  . Years of education: Not on file  . Highest education level: Not on file  Occupational History  . Not on file  Social Needs  . Financial resource strain: Not on file  . Food insecurity:    Worry: Not on file    Inability: Not on file  . Transportation needs:    Medical: Not on file    Non-medical: Not on file  Tobacco Use  . Smoking status: Former Smoker    Types: Cigarettes    Last attempt to quit: 09/18/1988    Years since quitting: 28.5  . Smokeless tobacco: Never Used  Substance and Sexual Activity  . Alcohol use: No  . Drug use: No  . Sexual activity: Never    Birth control/protection: None  Lifestyle  . Physical activity:    Days per week: Not on file    Minutes per session: Not on file  . Stress: Not on file  Relationships  . Social connections:    Talks on phone: Not on file    Gets together: Not on file    Attends religious service: Not on file    Active member of club or organization: Not on file    Attends meetings of clubs or organizations: Not on file    Relationship status: Not on file  Other Topics Concern  . Not on file  Social History Narrative  . Not on file   Family History  Problem Relation Age of Onset  . Cancer Mother   . Hypertension Sister   . Hypertension Brother   . Diabetes Brother   . Hypertension Sister   . Colon cancer Other   . Heart Problems Other 34       sister's child, open heart surgery     ASSESSMENT Recent Results: The most recent result is correlated with 20 mg per week: Lab Results  Component Value Date   INR 3.8 04/17/2017   INR 2.5 03/27/2017   INR 1.5 03/06/2017    Anticoagulation Dosing: Description   Do NOT take any warfarin today, Monday April 8th. On Tuesday, resume taking 1 tablet of your blue-colored 4mg  strength warfarin tablets by mouth once-daily at [facility] on Mondays, Wednesdays, and Fridays; all other days, take only 1/2 tablet.  INR today: Supratherapeutic  PLAN HOLD warfarin dose today, Monday April 8. Weekly dose was unchanged. Historic INRs have been low on 18mg  week, so am hesitant to lower the dose.  Note that warfarin anticoagulation tracker has been incorrectly documenting regimen as using 3mg  warfarin tablets - patient has actually been taking 4mg  tablets. This has been corrected.  Patient Instructions  Patient instructed to take medications as defined in the Anti-coagulation Track section of this encounter.  Patient instructed to HOLD today's dose.  Patient instructed NOT to take any warfarin today, Monday April 8th. On Tuesday, resume taking 1 tablet of your blue-colored 4mg  strength warfarin tablets by mouth once-daily at [facility] on Mondays, Wednesdays, and Fridays; all other days, take only 1/2 tablet.  Patient verbalized understanding of these instructions.    Patient advised to contact clinic or seek medical attention if signs/symptoms of bleeding or thromboembolism occur.  Patient verbalized understanding by repeating back information and was advised to contact me if further medication-related questions arise. Patient was also provided an information handout.  Follow-up Return in 3 weeks (on 05/08/2017) for Follow up INR at 0900.  Roderic Scarce Zigmund Daniel, PharmD PGY1 Pharmacy Resident Pager: [phone]  15 minutes spent face-to-face with the patient during the encounter. 50% of time spent on education. 50% of time was spent on point of  care INR testing, results interpretation, dose adjustment, and documentation in [facility] and [email or web address].

## 2017-04-18 ENCOUNTER — Other Ambulatory Visit: Payer: Self-pay | Admitting: *Deleted

## 2017-04-18 NOTE — Telephone Encounter (Signed)
Received refill request from pt's pharmacy

## 2017-04-19 ENCOUNTER — Other Ambulatory Visit: Payer: Self-pay | Admitting: Internal Medicine

## 2017-04-19 ENCOUNTER — Telehealth: Payer: Self-pay | Admitting: *Deleted

## 2017-04-19 MED ORDER — ALBUTEROL SULFATE HFA 108 (90 BASE) MCG/ACT IN AERS: 2.0000 | INHALATION_SPRAY | Freq: Four times a day (QID) | RESPIRATORY_TRACT | 2 refills | Status: DC | PRN

## 2017-04-19 NOTE — Telephone Encounter (Signed)
rec'd request for proair hfa 90 mcg inhaler 8.5 gm  Inhale 2 puffs into the lungs every 6 hours as needed for wheezing or shortness of breath This is not on her medlist Please advise If you agree please send script electronically to [facility] [address] [place]

## 2017-04-19 NOTE — Progress Notes (Signed)
rec'd request for proair hfa 90 mcg inhaler 8.5 gm  Inhale 2 puffs into the lungs every 6 hours as needed for wheezing or shortness of breath This is not on her medlist Please advise If you agree please send script electronically to [facility] [address] [place] 

## 2017-04-19 NOTE — Telephone Encounter (Signed)
Agree, will send Rx. Thanks Myriam Jacobson!

## 2017-04-25 NOTE — Progress Notes (Signed)
INTERNAL MEDICINE TEACHING ATTENDING ADDENDUM - Gust Rung, DO Duration- indefinate, Indication- recurrent VTE, INR- supratheraputic Lab Results  Component Value Date   INR 3.8 04/17/2017  . Agree with pharmacy recommendations as outlined in their note.

## 2017-04-28 ENCOUNTER — Other Ambulatory Visit: Payer: Self-pay

## 2017-04-28 ENCOUNTER — Emergency Department (HOSPITAL_COMMUNITY)
Admission: EM | Admit: 2017-04-28 | Discharge: 2017-04-29 | Discharge status: Against Medical Advice | Payer: Medicare Other | Attending: Emergency Medicine | Admitting: Emergency Medicine

## 2017-04-28 DIAGNOSIS — J45909 Unspecified asthma, uncomplicated: Secondary | ICD-10-CM | POA: Insufficient documentation

## 2017-04-28 DIAGNOSIS — R8290 Unspecified abnormal findings in urine: Secondary | ICD-10-CM | POA: Diagnosis not present

## 2017-04-28 DIAGNOSIS — Z7901 Long term (current) use of anticoagulants: Secondary | ICD-10-CM | POA: Insufficient documentation

## 2017-04-28 DIAGNOSIS — R110 Nausea: Secondary | ICD-10-CM | POA: Insufficient documentation

## 2017-04-28 DIAGNOSIS — R7989 Other specified abnormal findings of blood chemistry: Secondary | ICD-10-CM | POA: Insufficient documentation

## 2017-04-28 DIAGNOSIS — R1032 Left lower quadrant pain: Secondary | ICD-10-CM | POA: Insufficient documentation

## 2017-04-28 DIAGNOSIS — R05 Cough: Secondary | ICD-10-CM | POA: Diagnosis not present

## 2017-04-28 DIAGNOSIS — Z91040 Latex allergy status: Secondary | ICD-10-CM | POA: Insufficient documentation

## 2017-04-28 DIAGNOSIS — I110 Hypertensive heart disease with heart failure: Secondary | ICD-10-CM | POA: Diagnosis not present

## 2017-04-28 DIAGNOSIS — J4540 Moderate persistent asthma, uncomplicated: Secondary | ICD-10-CM | POA: Insufficient documentation

## 2017-04-28 DIAGNOSIS — Z87891 Personal history of nicotine dependence: Secondary | ICD-10-CM | POA: Insufficient documentation

## 2017-04-28 DIAGNOSIS — R197 Diarrhea, unspecified: Secondary | ICD-10-CM | POA: Diagnosis not present

## 2017-04-28 DIAGNOSIS — I5042 Chronic combined systolic (congestive) and diastolic (congestive) heart failure: Secondary | ICD-10-CM | POA: Insufficient documentation

## 2017-04-28 LAB — LIPASE, BLOOD: LIPASE: 39 U/L (ref 11–51)

## 2017-04-28 LAB — COMPREHENSIVE METABOLIC PANEL
ALBUMIN: 3.2 g/dL — AB (ref 3.5–5.0)
ALT: 14 U/L (ref 14–54)
ANION GAP: 11 (ref 5–15)
AST: 17 U/L (ref 15–41)
Alkaline Phosphatase: 63 U/L (ref 38–126)
BUN: 15 mg/dL (ref 6–20)
CHLORIDE: 103 mmol/L (ref 101–111)
CO2: 29 mmol/L (ref 22–32)
Calcium: 9.1 mg/dL (ref 8.9–10.3)
Creatinine, Ser: 1.24 mg/dL — ABNORMAL HIGH (ref 0.44–1.00)
GFR calc Af Amer: 50 mL/min — ABNORMAL LOW (ref >60)
GFR, EST NON AFRICAN AMERICAN: 43 mL/min — AB (ref >60)
GLUCOSE: 128 mg/dL — AB (ref 65–99)
POTASSIUM: 3.1 mmol/L — AB (ref 3.5–5.1)
Sodium: 143 mmol/L (ref 135–145)
Total Bilirubin: 0.5 mg/dL (ref 0.3–1.2)
Total Protein: 6.9 g/dL (ref 6.5–8.1)

## 2017-04-28 LAB — CBC
HEMATOCRIT: 34.2 % — AB (ref 36.0–46.0)
HEMOGLOBIN: 10.6 g/dL — AB (ref 12.0–15.0)
MCH: 25 pg — ABNORMAL LOW (ref 26.0–34.0)
MCHC: 31 g/dL (ref 30.0–36.0)
MCV: 80.7 fL (ref 78.0–100.0)
Platelets: 207 10*3/uL (ref 150–400)
RBC: 4.24 MIL/uL (ref 3.87–5.11)
RDW: 15.4 % (ref 11.5–15.5)
WBC: 9.5 10*3/uL (ref 4.0–10.5)

## 2017-04-28 NOTE — ED Triage Notes (Signed)
Patient c/o LLQ abd pain that began today after 12pm. Last BM yesterday; denies N/V

## 2017-04-29 ENCOUNTER — Emergency Department (HOSPITAL_COMMUNITY): Payer: Medicare Other

## 2017-04-29 DIAGNOSIS — R1032 Left lower quadrant pain: Secondary | ICD-10-CM | POA: Diagnosis not present

## 2017-04-29 LAB — URINALYSIS, ROUTINE W REFLEX MICROSCOPIC
Bacteria, UA: NONE SEEN
Bilirubin Urine: NEGATIVE
Glucose, UA: NEGATIVE mg/dL
Ketones, ur: NEGATIVE mg/dL
Nitrite: NEGATIVE
PH: 5 (ref 5.0–8.0)
Protein, ur: NEGATIVE mg/dL
Specific Gravity, Urine: 1.008 (ref 1.005–1.030)

## 2017-04-29 MED ORDER — FENTANYL CITRATE (PF) 100 MCG/2ML IJ SOLN
50.0000 ug | Freq: Once | INTRAMUSCULAR | Status: AC
  Administered 2017-04-29: 50 ug via INTRAVENOUS
  Filled 2017-04-29: qty 2

## 2017-04-29 MED ORDER — IOPAMIDOL (ISOVUE-300) INJECTION 61%
INTRAVENOUS | Status: AC
  Filled 2017-04-29: qty 50

## 2017-04-29 MED ORDER — LORAZEPAM 2 MG/ML IJ SOLN
1.0000 mg | Freq: Once | INTRAMUSCULAR | Status: DC
  Filled 2017-04-29: qty 1

## 2017-04-29 MED ORDER — CIPROFLOXACIN HCL 500 MG PO TABS: 500.0000 mg | ORAL_TABLET | Freq: Two times a day (BID) | ORAL | 0 refills | Status: DC

## 2017-04-29 MED ORDER — METRONIDAZOLE 500 MG PO TABS: 500.0000 mg | ORAL_TABLET | Freq: Two times a day (BID) | ORAL | 0 refills | Status: DC

## 2017-04-29 MED ORDER — ONDANSETRON HCL 4 MG/2ML IJ SOLN
4.0000 mg | Freq: Once | INTRAMUSCULAR | Status: AC
  Administered 2017-04-29: 4 mg via INTRAVENOUS
  Filled 2017-04-29: qty 2

## 2017-04-29 NOTE — ED Provider Notes (Signed)
[facility] EMERGENCY DEPARTMENT Provider Note   CSN: [id number] Arrival date & time: 04/28/17  2213     History   Chief Complaint Chief Complaint  Patient presents with  . Abdominal Pain    HPI Caroline [name] is a 72 y.o. female.  Patient presents to the emergency department with a chief complaint of left lower quadrant abdominal pain.  She states her symptoms started today.  She rates her pain as a 7 out of 10.  She reports associated nausea.  She states that she has had kidney stones in the past, but states that this does not feel similar.  She states that she has had a slight cough and has had allergies.  She denies any relief after taking Tylenol for her pain.  She reports having some mild diarrhea.  The history is provided by the patient. No language interpreter was used.    Past Medical History:  Diagnosis Date  . Arthritis   . Asthma   . Bronchitis   . CHF (congestive heart failure) (HCC)   . DVT, lower extremity, recurrent (HCC)    Patient had unprovoked PE on 2002 and DVT in right lower extremety 2008.  [name] GERD (gastroesophageal reflux disease)   . History of kidney stones   . Hypertension   . PE (pulmonary embolism)    Patient had unprovoked PE on 2002  . PONV (postoperative nausea and vomiting)   . Vertigo     Patient Active Problem List   Diagnosis Date Noted  . 'light-for-dates' infant with signs of fetal malnutrition 04/06/2017  . History of Clostridium difficile colitis 12/31/2016  . Anticoagulated on warfarin   . Orthostatic hypotension   . Lipodermatosclerosis 10/11/2016  . Morbid obesity with BMI of 45.0-49.9, adult (HCC) 05/02/2016  . Chronic combined systolic and diastolic CHF, NYHA class 1 (HCC) 09/21/2015  . DVT, lower extremity, recurrent (HCC)   . Vertigo 09/13/2011  . Allergic rhinitis 06/21/2006  . Hyperlipidemia 06/10/2006  . Essential hypertension 06/10/2006  . Moderate persistent asthma 06/10/2006    Past Surgical  History:  Procedure Laterality Date  . CYSTOSCOPY W/ URETERAL STENT PLACEMENT Right 09/16/2015   Procedure: CYSTOSCOPY WITH RETROGRADE PYELOGRAM/URETERAL STENT PLACEMENT;  Surgeon: Alfredo Martinez, MD;  Location: [facility] OR;  Service: Urology;  Laterality: Right;  . CYSTOSCOPY WITH RETROGRADE PYELOGRAM, URETEROSCOPY AND STENT PLACEMENT Right 12/11/2015   Procedure: RIGHT URETEROSCOPY/HOLMIUM LASER LITHOTRIPSY AND STONE REMOVAL removal and placement of double j stent;  Surgeon: Crist Fat, MD;  Location: [facility] ORS;  Service: Urology;  Laterality: Right;  . HOLMIUM LASER APPLICATION Right 12/11/2015   Procedure: HOLMIUM LASER APPLICATION;  Surgeon: Crist Fat, MD;  Location: [facility] OR[facility];  Service: Urology;  Laterality: Right;     OB History   None      Home Medications    Prior to Admission medications   Medication Sig Start Date End Date Taking? Authorizing Provider  albuterol (PROAIR HFA) 108 (90 Base) MCG/ACT inhaler Inhale 2 puffs into the lungs every 6 (six) hours as needed for wheezing or shortness of breath. 04/19/17  Yes Molt, Bethany, DO  budesonide-formoterol (SYMBICORT) 160-4.5 MCG/ACT inhaler Inhale 2 puffs into the lungs 2 (two) times daily. 02/08/17  Yes Molt, Bethany, DO  calcium citrate-vitamin D (CITRACAL+D) 315-200 MG-UNIT tablet Take 2 tablets daily by mouth. 11/14/16  Yes [name], MD  carvedilol (COREG) 12.5 MG tablet Take 1 tablet (12.5 mg total) by mouth 2 (two) times daily. 11/29/16 04/29/25 Yes  Molt, Bethany, DO  fluticasone (FLONASE) 50 MCG/ACT nasal spray PLACE 2 SPRAYS DAILY INTO BOTH NOSTRILS. 12/21/16  Yes Molt, Bethany, DO  furosemide (LASIX) 40 MG tablet Take 1 tablet (40 mg total) by mouth every other day. 11/29/16  Yes Molt, Bethany, DO  losartan (COZAAR) 100 MG tablet Take 1 tablet (100 mg total) by mouth daily. 11/29/16  Yes Molt, Bethany, DO  Magnesium Oxide 400 MG CAPS Take 1 capsule (400 mg total) by mouth 2 (two) times daily. 01/26/17  Yes Camnitz,  Will [name], MD  meclizine (ANTIVERT) 25 MG tablet Take 1 tablet (25 mg total) 3 (three) times daily as needed by mouth. 11/23/16  Yes Molt, Bethany, DO  montelukast (SINGULAIR) 10 MG tablet Take 1 tablet (10 mg total) at bedtime by mouth. 11/14/16  Yes [name], MD  pantoprazole (PROTONIX) 40 MG tablet Take 1 tablet (40 mg total) by mouth daily. 11/29/16  Yes Molt, Bethany, DO  pravastatin (PRAVACHOL) 80 MG tablet TAKE 1 TABLET (80 MG TOTAL) BY MOUTH DAILY. 11/29/16  Yes Molt, Bethany, DO  RESTASIS MULTIDOSE 0.05 % ophthalmic emulsion PLACE 1 DROP INTO BOTH EYES 2 (TWO) TIMES DAILY. USE ONCE DAILY 03/27/17  Yes Molt, Bethany, DO  warfarin (COUMADIN) 4 MG tablet Take 1 tablet (4mg ) by mouth once-daily at 6pm on Mondays, Wednesdays and Fridays. On all other days, take only 1/2 tablet. Patient taking differently: Take 2-4 mg by mouth See admin instructions. Take 1 tablet (4mg ) by mouth daily  on Mondays, Wednesdays and Fridays. On all other days, take  1/2 tablet. 03/28/17  Yes Molt, Bethany, DO  famotidine (PEPCID) 20 MG tablet Take 1 tablet (20 mg total) by mouth 2 (two) times daily. Patient not taking: Reported on 04/29/2017 04/07/17   Renne Crigler, PA-C    Family History Family History  Problem Relation Age of Onset  . Cancer Mother   . Hypertension Sister   . Hypertension Brother   . Diabetes Brother   . Hypertension Sister   . Colon cancer Other   . Heart Problems Other 34       sister's child, open heart surgery    Social History Social History   Tobacco Use  . Smoking status: Former Smoker    Types: Cigarettes    Last attempt to quit: 09/18/1988    Years since quitting: 28.6  . Smokeless tobacco: Never Used  Substance Use Topics  . Alcohol use: No  . Drug use: No     Allergies   Penicillins; Cabbage; Keflex [cephalexin]; Shellfish allergy; Tomato; and Latex   Review of Systems Review of Systems  All other systems reviewed and are negative.    Physical  Exam Updated Vital Signs BP (!) 148/79 (BP Location: Right Arm)   Pulse 84   Temp 98.2 F (36.8 C) (Oral)   Resp 16   Ht 5\' 3"  (1.6 m)   Wt 126.6 kg (279 lb)   SpO2 91%   BMI 49.42 kg/m   Physical Exam  Constitutional: She is oriented to person, place, and time. She appears well-developed and well-nourished.  HENT:  Head: Normocephalic and atraumatic.  Eyes: Pupils are equal, round, and reactive to light. Conjunctivae and EOM are normal.  Neck: Normal range of motion. Neck supple.  Cardiovascular: Normal rate and regular rhythm. Exam reveals no gallop and no friction rub.  No murmur heard. Pulmonary/Chest: Effort normal and breath sounds normal. No respiratory distress. She has no wheezes. She has no rales. She exhibits no tenderness.  Abdominal: Soft. Bowel sounds are normal. She exhibits no distension and no mass. There is tenderness in the left lower quadrant. There is no rebound and no guarding.  Musculoskeletal: Normal range of motion. She exhibits no edema or tenderness.  Neurological: She is alert and oriented to person, place, and time.  Skin: Skin is warm and dry.  Psychiatric: She has a normal mood and affect. Her behavior is normal. Judgment and thought content normal.  Nursing note and vitals reviewed.    ED Treatments / Results  Labs (all labs ordered are listed, but only abnormal results are displayed) Labs Reviewed  COMPREHENSIVE METABOLIC PANEL - Abnormal; Notable for the following components:      Result Value   Potassium 3.1 (*)    Glucose, Bld 128 (*)    Creatinine, Ser 1.24 (*)    Albumin 3.2 (*)    GFR calc non Af Amer 43 (*)    GFR calc Af Amer 50 (*)    All other components within normal limits  CBC - Abnormal; Notable for the following components:   Hemoglobin 10.6 (*)    HCT 34.2 (*)    MCH 25.0 (*)    All other components within normal limits  URINALYSIS, ROUTINE W REFLEX MICROSCOPIC - Abnormal; Notable for the following components:    APPearance HAZY (*)    Hgb urine dipstick SMALL (*)    Leukocytes, UA LARGE (*)    Squamous Epithelial / LPF 6-30 (*)    All other components within normal limits  LIPASE, BLOOD    EKG None  Radiology No results found.  Procedures Procedures (including critical care time)  Medications Ordered in ED Medications  fentaNYL (SUBLIMAZE) injection 50 mcg (has no administration in time range)  ondansetron (ZOFRAN) injection 4 mg (has no administration in time range)     Initial Impression / Assessment and Plan / ED Course  I have reviewed the triage vital signs and the nursing notes.  Pertinent labs & imaging results that were available during my care of the patient were reviewed by me and considered in my medical decision making (see chart for details).     Patient with LLQ pain, onset today.  Will check CT.  Urinalysis shows large leukocytes, too numerous to count white blood cells.  Symptoms could be related to UTI.  Patient does not have a leukocytosis.  Still unclear etiology of the patient's symptoms.  Patient is refusing to have CT scan done.  I have advised patient that I cannot tell if there is an acute intra-abdominal process without the CAT scan.  She adamantly refuses.  She will be discharged AGAINST MEDICAL ADVICE due to her unwillingness to get the CT scan.  She has capacity to make this decision for herself.  Her vital signs are stable.  I will treat her as best we can with Cipro and Flagyl to cover any intra-abdominal infectious pathology with the Cipro also covering her for UTI.  Recommend PCP follow-up.  She understands that without CT imaging we cannot rule out life-threatening illness which may lead to adverse outcomes such as death, morbidity, dysfunction.  Final Clinical Impressions(s) / ED Diagnoses   Final diagnoses:  Left lower quadrant pain  Abnormal urinalysis    ED Discharge Orders    None       Roxy Horseman, PA-C 04/29/17 0354    Azalia Bilis, MD 04/30/17 [phone]

## 2017-04-29 NOTE — Discharge Instructions (Addendum)
You need to have your coumadin checked this week.  Take antibiotics as directed.   Return to the ER if your symptoms worsen.

## 2017-05-01 ENCOUNTER — Other Ambulatory Visit: Payer: Self-pay

## 2017-05-01 ENCOUNTER — Ambulatory Visit (INDEPENDENT_AMBULATORY_CARE_PROVIDER_SITE_OTHER): Payer: Medicare Other | Admitting: Internal Medicine

## 2017-05-01 ENCOUNTER — Ambulatory Visit (INDEPENDENT_AMBULATORY_CARE_PROVIDER_SITE_OTHER): Payer: Medicare Other | Admitting: Pharmacy Technician

## 2017-05-01 VITALS — BP 148/68 | HR 72 | Temp 97.4°F | Ht 63.0 in | Wt 289.0 lb

## 2017-05-01 DIAGNOSIS — E876 Hypokalemia: Secondary | ICD-10-CM | POA: Insufficient documentation

## 2017-05-01 DIAGNOSIS — Z7901 Long term (current) use of anticoagulants: Secondary | ICD-10-CM | POA: Diagnosis not present

## 2017-05-01 DIAGNOSIS — R1032 Left lower quadrant pain: Secondary | ICD-10-CM

## 2017-05-01 DIAGNOSIS — Z8719 Personal history of other diseases of the digestive system: Secondary | ICD-10-CM

## 2017-05-01 DIAGNOSIS — I5040 Unspecified combined systolic (congestive) and diastolic (congestive) heart failure: Secondary | ICD-10-CM | POA: Diagnosis not present

## 2017-05-01 DIAGNOSIS — I82402 Acute embolism and thrombosis of unspecified deep veins of left lower extremity: Secondary | ICD-10-CM

## 2017-05-01 DIAGNOSIS — Z86718 Personal history of other venous thrombosis and embolism: Secondary | ICD-10-CM | POA: Diagnosis not present

## 2017-05-01 DIAGNOSIS — I110 Hypertensive heart disease with heart failure: Secondary | ICD-10-CM

## 2017-05-01 DIAGNOSIS — Z79899 Other long term (current) drug therapy: Secondary | ICD-10-CM

## 2017-05-01 HISTORY — DX: Hypokalemia: E87.6

## 2017-05-01 LAB — POCT INR: INR: 2.8

## 2017-05-01 NOTE — Assessment & Plan Note (Signed)
Assessment: While reviewing work-up obtained in ED, she had hypokalemia to 3.1. She has been compliant with her lasix and doesn't think she was given K+-supplementation in ED.   Plan: Will check BMET today to evaluate her slight AKI and hypokalemia noted in ED. Suppl as needed.

## 2017-05-01 NOTE — Assessment & Plan Note (Addendum)
Assessment: Pt presents today for ED F/U. Seen 04/28/17 with 1-day hx LLQ abdominal pain & nausea, and 2-3 day history of diarrhea. CT abdomen recommended for evaluation of intra-abdominal pathology but patient refused and left AMA. She was given a 1-week course of Cipro and Flagyl for presumed intra-abdominal infection. Since then, Caroline [name] has noticed significant improvement in her LLQ pain and resolution of diarrhea. She denies fevers, chills, vomiting, hematochezia or melena. She denies prior history of diverticulitis but diverticulosis was noted on CT abdomen in 2017.   Plan: She has had near resolution of her symptoms. She is on chronic Warfarin and also has history of C. Diff colitis, and ideally I would like to reduce her antibiotic exposure. I'm reassured by her clinical improvement and will continue antibiotics to complete a 5-day course of Cipro and Flagyl for presumed diverticulitis, although it is possible this could have been a self-limited viral gastroenteritis. Patient to contact clinic should she develop fevers, chills, worsened abdominal pain or bleeding. She actually has an appointment with me scheduled early next week and evaluate for continued improvement. -Cipro and Flagyl to complete 5 day course for presumed Diverticulitis -Pt has appointment with me next week

## 2017-05-01 NOTE — Progress Notes (Signed)
Indication: Recurrent deep venous thromboses. Duration: Indefinite. INR: At target. Agree with Dr. Oriet's assessment and plan. 

## 2017-05-01 NOTE — Progress Notes (Signed)
Anticoagulation Management Caroline [name] is a 72 y.o. female who reports to the clinic for monitoring of warfarin treatment.    Indication: DVT  Duration: indefinite Supervising physician: [name]  Anticoagulation Clinic Visit History: Patient does not report signs/symptoms of bleeding or thromboembolism  Other recent changes: Pt recently in Emergency Department at [facility] being worked up for possible intra-abdominal infection on 4/20, and treated with Cipro and Flagyl for 7 days.  Warfarin doses on 4/20 and 4/21 empirically held. Anticoagulation Episode Summary    Current INR goal:   2.0-3.0  TTR:   81.1 % (2.2 y)  Next INR check:   05/05/2017  INR from last check:   2.8 (05/01/2017)  Weekly max warfarin dose:     Target end date:     INR check location:   Anticoagulation Clinic  Preferred lab:     Send INR reminders to:   ANT[facility]   Indications   DVT lower extremity recurrent (HCC) [I82.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 Penicillins Anaphylaxis, Hives, Swelling and Rash    Has patient had a PCN reaction causing immediate rash, facial/tongue/throat swelling, SOB or lightheadedness with hypotension: Yes Has patient had a PCN reaction causing severe rash involving mucus membranes or skin necrosis: Yes Has patient had a PCN reaction that required hospitalization Yes Has patient had a PCN reaction occurring within the last 10 years: No If all of the above answers are "NO", then may proceed with Cephalosporin use.   . Cabbage Itching  . Keflex [Cephalexin] Hives    And feeling of throat tightness  . Shellfish Allergy Swelling  . Tomato Swelling  . Latex Itching and Rash   Prior to Admission medications   Medication Sig Start Date End Date Taking? Authorizing Provider  albuterol (PROAIR HFA) 108 (90 Base) MCG/ACT inhaler Inhale 2 puffs into the lungs every 6 (six) hours as needed for  wheezing or shortness of breath. 04/19/17  Yes Molt, Bethany, DO  budesonide-formoterol (SYMBICORT) 160-4.5 MCG/ACT inhaler Inhale 2 puffs into the lungs 2 (two) times daily. 02/08/17  Yes Molt, Bethany, DO  calcium citrate-vitamin D (CITRACAL+D) 315-200 MG-UNIT tablet Take 2 tablets daily by mouth. 11/14/16  Yes [name], MD  carvedilol (COREG) 12.5 MG tablet Take 1 tablet (12.5 mg total) by mouth 2 (two) times daily. 11/29/16 04/29/25 Yes Molt, Bethany, DO  ciprofloxacin (CIPRO) 500 MG tablet Take 1 tablet (500 mg total) by mouth 2 (two) times daily. 04/29/17  Yes Roxy Horseman, PA-C  famotidine (PEPCID) 20 MG tablet Take 1 tablet (20 mg total) by mouth 2 (two) times daily. 04/07/17  Yes Renne Crigler, PA-C  fluticasone (FLONASE) 50 MCG/ACT nasal spray PLACE 2 SPRAYS DAILY INTO BOTH NOSTRILS. 12/21/16  Yes Molt, Bethany, DO  furosemide (LASIX) 40 MG tablet Take 1 tablet (40 mg total) by mouth every other day. 11/29/16  Yes Molt, Bethany, DO  losartan (COZAAR) 100 MG tablet Take 1 tablet (100 mg total) by mouth daily. 11/29/16  Yes Molt, Bethany, DO  Magnesium Oxide 400 MG CAPS Take 1 capsule (400 mg total) by mouth 2 (two) times daily. 01/26/17  Yes Camnitz, Will [name], MD  meclizine (ANTIVERT) 25 MG tablet Take 1 tablet (25 mg total) 3 (three) times daily as needed by mouth. 11/23/16  Yes Molt, Bethany, DO  metroNIDAZOLE (FLAGYL) 500 MG tablet Take 1 tablet (  500 mg total) by mouth 2 (two) times daily. 04/29/17  Yes Roxy Horseman, PA-C  montelukast (SINGULAIR) 10 MG tablet Take 1 tablet (10 mg total) at bedtime by mouth. 11/14/16  Yes [name], MD  pantoprazole (PROTONIX) 40 MG tablet Take 1 tablet (40 mg total) by mouth daily. 11/29/16  Yes Molt, Bethany, DO  pravastatin (PRAVACHOL) 80 MG tablet TAKE 1 TABLET (80 MG TOTAL) BY MOUTH DAILY. 11/29/16  Yes Molt, Bethany, DO  RESTASIS MULTIDOSE 0.05 % ophthalmic emulsion PLACE 1 DROP INTO BOTH EYES 2 (TWO) TIMES DAILY. USE ONCE DAILY  03/27/17  Yes Molt, Bethany, DO  warfarin (COUMADIN) 4 MG tablet Take 1 tablet (4mg ) by mouth once-daily at 6pm on Mondays, Wednesdays and Fridays. On all other days, take only 1/2 tablet. Patient taking differently: Take 2-4 mg by mouth See admin instructions. Take 1 tablet (4mg ) by mouth daily  on Mondays, Wednesdays and Fridays. On all other days, take  1/2 tablet. 03/28/17  Yes Molt, Toma Copier, DO   Past Medical History:  Diagnosis Date  . Arthritis   . Asthma   . Bronchitis   . CHF (congestive heart failure) (HCC)   . DVT, lower extremity, recurrent (HCC)    Patient had unprovoked PE on 2002 and DVT in right lower extremety 2008.  [name] GERD (gastroesophageal reflux disease)   . History of kidney stones   . Hypertension   . PE (pulmonary embolism)    Patient had unprovoked PE on 2002  . PONV (postoperative nausea and vomiting)   . Vertigo    Social History   Socioeconomic History  . Marital status: Divorced    Spouse name: Not on file  . Number of children: Not on file  . Years of education: Not on file  . Highest education level: Not on file  Occupational History  . Not on file  Social Needs  . Financial resource strain: Not on file  . Food insecurity:    Worry: Not on file    Inability: Not on file  . Transportation needs:    Medical: Not on file    Non-medical: Not on file  Tobacco Use  . Smoking status: Former Smoker    Types: Cigarettes    Last attempt to quit: 09/18/1988    Years since quitting: 28.6  . Smokeless tobacco: Never Used  Substance and Sexual Activity  . Alcohol use: No  . Drug use: No  . Sexual activity: Never    Birth control/protection: None  Lifestyle  . Physical activity:    Days per week: Not on file    Minutes per session: Not on file  . Stress: Not on file  Relationships  . Social connections:    Talks on phone: Not on file    Gets together: Not on file    Attends religious service: Not on file    Active member of club or organization:  Not on file    Attends meetings of clubs or organizations: Not on file    Relationship status: Not on file  Other Topics Concern  . Not on file  Social History Narrative  . Not on file   Family History  Problem Relation Age of Onset  . Cancer Mother   . Hypertension Sister   . Hypertension Brother   . Diabetes Brother   . Hypertension Sister   . Colon cancer Other   . Heart Problems Other 34       sister's child, open heart surgery  ASSESSMENT Recent Results: The most recent result is correlated with 20 mg per week and doses held the last two days. Lab Results  Component Value Date   INR 2.8 05/01/2017   INR 3.8 04/17/2017   INR 2.5 03/27/2017    Anticoagulation Dosing: Description   Take 1/2 of your 4mg  warfarin tablets every day this week except Friday, on Friday do NOT take your whole 4mg  tablet until after you have been seen in clinic.     INR today: Therapeutic  PLAN Weekly dose was decreased by 20% to 16 mg per week due to addition of abx regimen of Cipro and Flagyl for 7 days.  Will see in clinic 4/26 for INR and further evaluation.    Patient Instructions  Patient instructed to take medications as defined in the Anti-coagulation Track section of this encounter.  Patient instructed to take today's dose.  Patient instructed to take 1/2 of your 4mg  warfarin tablets every day this week except Friday, on Friday do NOT take your whole 4mg  tablet until after you have been seen in clinic. Patient verbalized understanding of these instructions.     Patient advised to contact clinic or seek medical attention if signs/symptoms of bleeding or thromboembolism occur.  Patient verbalized understanding by repeating back information and was advised to contact me if further medication-related questions arise. Patient was also provided an information handout.  Follow-up Return in about 4 days (around 05/05/2017) for INR follow up at 1000.  Daylene Posey, PharmD Pharmacy  Resident Pager #: [phone] 05/01/2017 10:41 AM  15 minutes spent face-to-face with the patient during the encounter. 50% of time spent on education. 50% of time was spent on INR point of care testing, analysis, and documentation in EMR.

## 2017-05-01 NOTE — Assessment & Plan Note (Addendum)
Assessment & Plan: Patient on chronic warfarin due to recurrent VTEs. INR today 2.8. She was prescribed a course of antibiotics for possible intra-abdominal infection, which could increase her Warfarin level. Will try to limit her antibiotic exposure by treating with 5-day course and close follow-up in Coumadin Clinic here later on this week.

## 2017-05-01 NOTE — Patient Instructions (Signed)
Patient instructed to take medications as defined in the Anti-coagulation Track section of this encounter.  Patient instructed to take today's dose.  Patient instructed to take 1/2 of your 4mg  warfarin tablets every day this week except Friday, on Friday do NOT take your whole 4mg  tablet until after you have been seen in clinic. Patient verbalized understanding of these instructions.

## 2017-05-01 NOTE — Patient Instructions (Addendum)
It was great seeing you today. I'm glad you are feeling better!  The ED treated you for presumed Diverticulitis, which is an infection of a small out-pouching of your colon. I'm glad you are doing better! Lets continue your antibiotics for a total of FIVE days. Please contact the clinic and let me know if you have any worsening symptoms, fevers or bleeding.   I have an appointment with you 4/29. I look forward to seeing you then!!   Diverticulitis Diverticulitis is when small pockets in your large intestine (colon) get infected or swollen. This causes stomach pain and watery poop (diarrhea). These pouches are called diverticula. They form in people who have a condition called diverticulosis. Follow these instructions at home: Medicines  Take over-the-counter and prescription medicines only as told by your doctor. These include: ? Antibiotics. ? Pain medicines. ? Fiber pills. ? Probiotics. ? Stool softeners.  Do not drive or use heavy machinery while taking prescription pain medicine.  If you were prescribed an antibiotic, take it as told. Do not stop taking it even if you feel better. General instructions  Follow a diet as told by your doctor.  When you feel better, your doctor may tell you to change your diet. You may need to eat a lot of fiber. Fiber makes it easier to poop (have bowel movements). Healthy foods with fiber include: ? Berries. ? Beans. ? Lentils. ? Green vegetables.  Exercise 3 or more times a week. Aim for 30 minutes each time. Exercise enough to sweat and make your heart beat faster.  Keep all follow-up visits as told. This is important. You may need to have an exam of the large intestine. This is called a colonoscopy. Contact a doctor if:  Your pain does not get better.  You have a hard time eating or drinking.  You are not pooping like normal. Get help right away if:  Your pain gets worse.  Your problems do not get better.  Your problems get worse  very fast.  You have a fever.  You throw up (vomit) more than one time.  You have poop that is: ? Bloody. ? Black. ? Tarry. Summary  Diverticulitis is when small pockets in your large intestine (colon) get infected or swollen.  Take medicines only as told by your doctor.  Follow a diet as told by your doctor. This information is not intended to replace advice given to you by your health care provider. Make sure you discuss any questions you have with your health care provider. Document Released: 06/15/2007 Document Revised: 01/14/2016 Document Reviewed: 01/14/2016 Elsevier Interactive Patient Education  2017 [employer].

## 2017-05-01 NOTE — Progress Notes (Signed)
Case discussed with Dr. Molt at the time of the visit.  We reviewed the resident's history and exam and pertinent patient test results.  I agree with the assessment, diagnosis and plan of care documented in the resident's note. 

## 2017-05-01 NOTE — Progress Notes (Signed)
   CC: follow-up of ED visit, LLQ pain  HPI:  [name] is a 72 y.o. 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 Diff colitis following clindamycin for treatment of cellulitis, combined systolic and diastolic CHF, and HTN who presents today for ED follow-up of LLQ pain.  For details regarding today's visit and the status of their chronic medical issues, please refer to the assessment and plan.  Past Medical History:  Diagnosis Date  . Arthritis   . Asthma   . Bronchitis   . CHF (congestive heart failure) (HCC)   . DVT, lower extremity, recurrent (HCC)    Patient had unprovoked PE on 2002 and DVT in right lower extremety 2008.  [name] GERD (gastroesophageal reflux disease)   . History of kidney stones   . Hypertension   . PE (pulmonary embolism)    Patient had unprovoked PE on 2002  . PONV (postoperative nausea and vomiting)   . Vertigo    Review of Systems:   General: Denies fevers or chills since ED discharge. HEENT: Denies sore throat, rhinorrhea, dysphagia Cardiac: Denies CP, SOB Pulmonary: Denies cough, wheezing, worsened orthopnea Abd: Improved abdominal pain. Diarrhea and nausea resolved. Denies emesis, hematochezia or melena.  GU: Denies dysuria, increased frequency, lower abdominal pain  Physical Exam: General: Alert, in no acute distress. Pleasant and conversant HEENT: No icterus, injection or ptosis. No hoarseness or dysarthria  Cardiac: RRR, no MGR. No JVD. Pulmonary: CTA BL with normal WOB on RA. Able to speak in complete sentences Abd: Soft, not distended and non-tender. +bs Extremities: Warm, perfused. No significant pedal edema.   Vitals:   05/01/17 1057  BP: (!) 148/68  Pulse: 72  Temp: (!) 97.4 F (36.3 C)  TempSrc: Oral  SpO2: 100%  Weight: 289 lb (131.1 kg)  Height: 5\' 3"  (1.6 m)   Body mass index is 51.19 kg/m.  Assessment & Plan:   See Encounters Tab for problem based charting.  Patient discussed with Dr. Josem Kaufmann

## 2017-05-02 LAB — BMP8+ANION GAP
Anion Gap: 15 mmol/L (ref 10.0–18.0)
BUN/Creatinine Ratio: 9 — ABNORMAL LOW (ref 12–28)
BUN: 8 mg/dL (ref 8–27)
CALCIUM: 9.1 mg/dL (ref 8.7–10.3)
CHLORIDE: 103 mmol/L (ref 96–106)
CO2: 27 mmol/L (ref 20–29)
Creatinine, Ser: 0.93 mg/dL (ref 0.57–1.00)
GFR, EST AFRICAN AMERICAN: 72 mL/min/{1.73_m2} (ref >59)
GFR, EST NON AFRICAN AMERICAN: 62 mL/min/{1.73_m2} (ref >59)
GLUCOSE: 124 mg/dL — AB (ref 65–99)
Potassium: 3.2 mmol/L — ABNORMAL LOW (ref 3.5–5.2)
Sodium: 145 mmol/L — ABNORMAL HIGH (ref 134–144)

## 2017-05-05 ENCOUNTER — Ambulatory Visit (INDEPENDENT_AMBULATORY_CARE_PROVIDER_SITE_OTHER): Payer: Medicare Other | Admitting: Pharmacist

## 2017-05-05 DIAGNOSIS — Z86718 Personal history of other venous thrombosis and embolism: Secondary | ICD-10-CM

## 2017-05-05 DIAGNOSIS — I82409 Acute embolism and thrombosis of unspecified deep veins of unspecified lower extremity: Secondary | ICD-10-CM

## 2017-05-05 DIAGNOSIS — Z5181 Encounter for therapeutic drug level monitoring: Secondary | ICD-10-CM | POA: Diagnosis not present

## 2017-05-05 DIAGNOSIS — Z7901 Long term (current) use of anticoagulants: Secondary | ICD-10-CM

## 2017-05-05 LAB — POCT INR: INR: 2.3

## 2017-05-05 NOTE — Patient Instructions (Signed)
Patient instructed to take medications as defined in the Anti-coagulation Track section of this encounter.  Patient instructed to take today's dose.  Patient instructed to take one (1) of your 4mg blue-colored warfarin tablets on Mondays, Wednesdays and Fridays. All other days, take only one-half (1/2) of your 4mg blue-colored warfarin tablets.  Patient verbalized understanding of these instructions.    

## 2017-05-05 NOTE — Progress Notes (Signed)
INTERNAL MEDICINE TEACHING ATTENDING ADDENDUM - Gust Rung, DO Duration- indefinate, Indication- DVT, INR-  Lab Results  Component Value Date   INR 2.30 05/05/2017  . Agree with pharmacy recommendations as outlined in their note.

## 2017-05-05 NOTE — Progress Notes (Signed)
Anticoagulation Management Caroline [name] is a 72 y.o. female who reports to the clinic for monitoring of warfarin treatment.    Indication: DVT, history of; long term current use of anticoagulant.  Duration: indefinite Supervising physician: Carlynn Purl  Anticoagulation Clinic Visit History: Patient does not report signs/symptoms of bleeding or thromboembolism  Other recent changes: YES.diet, medications, lifestyle--see patient findings.  Anticoagulation Episode Summary    Current INR goal:   2.0-3.0  TTR:   81.2 % (2.2 y)  Next INR check:   05/15/2017  INR from last check:   2.30 (05/05/2017)  Weekly max warfarin dose:     Target end date:     INR check location:   Anticoagulation Clinic  Preferred lab:     Send INR reminders to:   [facility]   Indications   DVT lower extremity recurrent (HCC) [I82.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 Penicillins Anaphylaxis, Hives, Swelling and Rash    Has patient had a PCN reaction causing immediate rash, facial/tongue/throat swelling, SOB or lightheadedness with hypotension: Yes Has patient had a PCN reaction causing severe rash involving mucus membranes or skin necrosis: Yes Has patient had a PCN reaction that required hospitalization Yes Has patient had a PCN reaction occurring within the last 10 years: No If all of the above answers are "NO", then may proceed with Cephalosporin use.   . Cabbage Itching  . Keflex [Cephalexin] Hives    And feeling of throat tightness  . Shellfish Allergy Swelling  . Tomato Swelling  . Latex Itching and Rash   Prior to Admission medications   Medication Sig Start Date End Date Taking? Authorizing Provider  albuterol (PROAIR HFA) 108 (90 Base) MCG/ACT inhaler Inhale 2 puffs into the lungs every 6 (six) hours as needed for wheezing or shortness of breath. 04/19/17  Yes Molt, Bethany, DO  budesonide-formoterol (SYMBICORT)  160-4.5 MCG/ACT inhaler Inhale 2 puffs into the lungs 2 (two) times daily. 02/08/17  Yes Molt, Bethany, DO  calcium citrate-vitamin D (CITRACAL+D) 315-200 MG-UNIT tablet Take 2 tablets daily by mouth. 11/14/16  Yes [name], MD  carvedilol (COREG) 12.5 MG tablet Take 1 tablet (12.5 mg total) by mouth 2 (two) times daily. 11/29/16 04/29/25 Yes Molt, Bethany, DO  famotidine (PEPCID) 20 MG tablet Take 1 tablet (20 mg total) by mouth 2 (two) times daily. 04/07/17  Yes Renne Crigler, PA-C  fluticasone (FLONASE) 50 MCG/ACT nasal spray PLACE 2 SPRAYS DAILY INTO BOTH NOSTRILS. 12/21/16  Yes Molt, Bethany, DO  furosemide (LASIX) 40 MG tablet Take 1 tablet (40 mg total) by mouth every other day. 11/29/16  Yes Molt, Bethany, DO  losartan (COZAAR) 100 MG tablet Take 1 tablet (100 mg total) by mouth daily. 11/29/16  Yes Molt, Bethany, DO  Magnesium Oxide 400 MG CAPS Take 1 capsule (400 mg total) by mouth 2 (two) times daily. 01/26/17  Yes Camnitz, Will [name], MD  meclizine (ANTIVERT) 25 MG tablet Take 1 tablet (25 mg total) 3 (three) times daily as needed by mouth. 11/23/16  Yes Molt, Bethany, DO  montelukast (SINGULAIR) 10 MG tablet Take 1 tablet (10 mg total) at bedtime by mouth. 11/14/16  Yes [name], MD  pantoprazole (PROTONIX) 40 MG tablet Take 1 tablet (40 mg total) by mouth daily. 11/29/16  Yes Molt, Bethany, DO  pravastatin (PRAVACHOL) 80 MG tablet TAKE 1 TABLET (80 MG  TOTAL) BY MOUTH DAILY. 11/29/16  Yes Molt, Bethany, DO  RESTASIS MULTIDOSE 0.05 % ophthalmic emulsion PLACE 1 DROP INTO BOTH EYES 2 (TWO) TIMES DAILY. USE ONCE DAILY 03/27/17  Yes Molt, Bethany, DO  warfarin (COUMADIN) 4 MG tablet Take 1 tablet (4mg ) by mouth once-daily at 6pm on Mondays, Wednesdays and Fridays. On all other days, take only 1/2 tablet. Patient taking differently: Take 2-4 mg by mouth See admin instructions. Take 1 tablet (4mg ) by mouth daily  on Mondays, Wednesdays and Fridays. On all other days, take  1/2 tablet.  03/28/17  Yes Molt, Bethany, DO  ciprofloxacin (CIPRO) 500 MG tablet Take 1 tablet (500 mg total) by mouth 2 (two) times daily. Patient not taking: Reported on 05/05/2017 04/29/17   Roxy Horseman, PA-C  metroNIDAZOLE (FLAGYL) 500 MG tablet Take 1 tablet (500 mg total) by mouth 2 (two) times daily. Patient not taking: Reported on 05/05/2017 04/29/17   Roxy Horseman, PA-C   Past Medical History:  Diagnosis Date  . Arthritis   . Asthma   . Bronchitis   . CHF (congestive heart failure) (HCC)   . DVT, lower extremity, recurrent (HCC)    Patient had unprovoked PE on 2002 and DVT in right lower extremety 2008.  [name] GERD (gastroesophageal reflux disease)   . History of kidney stones   . Hypertension   . PE (pulmonary embolism)    Patient had unprovoked PE on 2002  . PONV (postoperative nausea and vomiting)   . Vertigo    Social History   Socioeconomic History  . Marital status: Divorced    Spouse name: Not on file  . Number of children: Not on file  . Years of education: Not on file  . Highest education level: Not on file  Occupational History  . Not on file  Social Needs  . Financial resource strain: Not on file  . Food insecurity:    Worry: Not on file    Inability: Not on file  . Transportation needs:    Medical: Not on file    Non-medical: Not on file  Tobacco Use  . Smoking status: Former Smoker    Types: Cigarettes    Last attempt to quit: 09/18/1988    Years since quitting: 28.6  . Smokeless tobacco: Never Used  Substance and Sexual Activity  . Alcohol use: No  . Drug use: No  . Sexual activity: Never    Birth control/protection: None  Lifestyle  . Physical activity:    Days per week: Not on file    Minutes per session: Not on file  . Stress: Not on file  Relationships  . Social connections:    Talks on phone: Not on file    Gets together: Not on file    Attends religious service: Not on file    Active member of club or organization: Not on file    Attends  meetings of clubs or organizations: Not on file    Relationship status: Not on file  Other Topics Concern  . Not on file  Social History Narrative  . Not on file   Family History  Problem Relation Age of Onset  . Cancer Mother   . Hypertension Sister   . Hypertension Brother   . Diabetes Brother   . Hypertension Sister   . Colon cancer Other   . Heart Problems Other 34       sister's child, open heart surgery    ASSESSMENT Recent Results: The most recent result  is correlated with 16 mg per week: Lab Results  Component Value Date   INR 2.30 05/05/2017   INR 2.8 05/01/2017   INR 3.8 04/17/2017    Anticoagulation Dosing: Description   Take one (1) of your 4mg  blue-colored warfarin tablets on Mondays, Wednesdays and Fridays. All other days, take only one-half (1/2) of your 4mg  blue-colored warfarin tablets.      INR today: Therapeutic  PLAN Weekly dose was increased by 25% to 20 mg per week (dose she had been stable upon prior to introduction of metronidazole and cipro--which have now been discontinued since Wednesday 24-APR-19.   Patient Instructions  Patient instructed to take medications as defined in the Anti-coagulation Track section of this encounter.  Patient instructed to take today's dose.  Patient instructed to take  one (1) of your 4mg  blue-colored warfarin tablets on Mondays, Wednesdays and Fridays. All other days, take only one-half (1/2) of your 4mg  blue-colored warfarin tablets.  Patient verbalized understanding of these instructions.     Patient advised to contact clinic or seek medical attention if signs/symptoms of bleeding or thromboembolism occur.  Patient verbalized understanding by repeating back information and was advised to contact me if further medication-related questions arise. Patient was also provided an information handout.  Follow-up Return in about 10 days (around 05/15/2017) for Follow up INR at 0945h.  Elicia Lamp, PharmD, CACP,  CPP  15 minutes spent face-to-face with the patient during the encounter. 50% of time spent on education. 50% of time was spent on fingerstick point of care INR sample collection, processing, results determination, dose adjustment and documentation in [email or web address]

## 2017-05-08 ENCOUNTER — Ambulatory Visit: Payer: Medicare Other | Admitting: Internal Medicine

## 2017-05-09 DIAGNOSIS — I872 Venous insufficiency (chronic) (peripheral): Secondary | ICD-10-CM | POA: Diagnosis not present

## 2017-05-15 ENCOUNTER — Ambulatory Visit (INDEPENDENT_AMBULATORY_CARE_PROVIDER_SITE_OTHER): Payer: Medicare Other

## 2017-05-15 ENCOUNTER — Encounter (INDEPENDENT_AMBULATORY_CARE_PROVIDER_SITE_OTHER): Payer: Self-pay

## 2017-05-15 DIAGNOSIS — Z5181 Encounter for therapeutic drug level monitoring: Secondary | ICD-10-CM

## 2017-05-15 DIAGNOSIS — Z7901 Long term (current) use of anticoagulants: Secondary | ICD-10-CM | POA: Diagnosis not present

## 2017-05-15 DIAGNOSIS — I82409 Acute embolism and thrombosis of unspecified deep veins of unspecified lower extremity: Secondary | ICD-10-CM

## 2017-05-15 DIAGNOSIS — Z86718 Personal history of other venous thrombosis and embolism: Secondary | ICD-10-CM

## 2017-05-15 LAB — POCT INR: INR: 3.3

## 2017-05-15 NOTE — Progress Notes (Signed)
INTERNAL MEDICINE TEACHING ATTENDING ADDENDUM - Shacara Cozine M.D  Duration- indefinite, Indication- recurrent VTE, INR- supratherapeutic. Agree with pharmacy recommendations as outlined in their note.     

## 2017-05-15 NOTE — Patient Instructions (Signed)
Patient instructed to take medications as defined in the Anti-coagulation Track section of this encounter.  Patient instructed to take today's dose.  Patient verbalized understanding of these instructions.  Patient was instructed to take one (1) of your 4mg  blue-colored warfarin tablets on Wednesdays and Fridays. All other days, take only one-half (1/2) of your 4mg  blue-colored warfarin tablets.

## 2017-05-15 NOTE — Progress Notes (Signed)
Anticoagulation Management Caroline [name] is a 72 y.o. female who reports to the clinic for monitoring of warfarin treatment.    Indication: DVT and PE history Duration: indefinite Supervising physician: Earl Lagos  Anticoagulation Clinic Visit History: Patient does not report signs/symptoms of bleeding or thromboembolism  Other recent changes: denies any diet, medications, lifestyle changes Anticoagulation Episode Summary    Current INR goal:   2.0-3.0  TTR:   81.0 % (2.3 y)  Next INR check:   06/12/2017  INR from last check:   3.3! (05/15/2017)  Weekly max warfarin dose:     Target end date:     INR check location:   Anticoagulation Clinic  Preferred lab:     Send INR reminders to:   [facility]   Indications   DVT lower extremity recurrent (HCC) [I82.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 Penicillins Anaphylaxis, Hives, Swelling and Rash    Has patient had a PCN reaction causing immediate rash, facial/tongue/throat swelling, SOB or lightheadedness with hypotension: Yes Has patient had a PCN reaction causing severe rash involving mucus membranes or skin necrosis: Yes Has patient had a PCN reaction that required hospitalization Yes Has patient had a PCN reaction occurring within the last 10 years: No If all of the above answers are "NO", then may proceed with Cephalosporin use.   . Cabbage Itching  . Keflex [Cephalexin] Hives    And feeling of throat tightness  . Shellfish Allergy Swelling  . Tomato Swelling  . Latex Itching and Rash   Prior to Admission medications   Medication Sig Start Date End Date Taking? Authorizing Provider  albuterol (PROAIR HFA) 108 (90 Base) MCG/ACT inhaler Inhale 2 puffs into the lungs every 6 (six) hours as needed for wheezing or shortness of breath. 04/19/17   Molt, Bethany, DO  budesonide-formoterol (SYMBICORT) 160-4.5 MCG/ACT inhaler Inhale 2 puffs into the  lungs 2 (two) times daily. 02/08/17   Molt, Bethany, DO  calcium citrate-vitamin D (CITRACAL+D) 315-200 MG-UNIT tablet Take 2 tablets daily by mouth. 11/14/16   [name], MD  carvedilol (COREG) 12.5 MG tablet Take 1 tablet (12.5 mg total) by mouth 2 (two) times daily. 11/29/16 04/29/25  Molt, Bethany, DO  ciprofloxacin (CIPRO) 500 MG tablet Take 1 tablet (500 mg total) by mouth 2 (two) times daily. Patient not taking: Reported on 05/05/2017 04/29/17   Roxy Horseman, PA-C  famotidine (PEPCID) 20 MG tablet Take 1 tablet (20 mg total) by mouth 2 (two) times daily. 04/07/17   Renne Crigler, PA-C  fluticasone (FLONASE) 50 MCG/ACT nasal spray PLACE 2 SPRAYS DAILY INTO BOTH NOSTRILS. 12/21/16   Molt, Bethany, DO  furosemide (LASIX) 40 MG tablet Take 1 tablet (40 mg total) by mouth every other day. 11/29/16   Molt, Bethany, DO  losartan (COZAAR) 100 MG tablet Take 1 tablet (100 mg total) by mouth daily. 11/29/16   Molt, Bethany, DO  Magnesium Oxide 400 MG CAPS Take 1 capsule (400 mg total) by mouth 2 (two) times daily. 01/26/17   Camnitz, Andree Coss, MD  meclizine (ANTIVERT) 25 MG tablet Take 1 tablet (25 mg total) 3 (three) times daily as needed by mouth. 11/23/16   Molt, Bethany, DO  metroNIDAZOLE (FLAGYL) 500 MG tablet Take 1 tablet (500 mg total) by mouth 2 (two) times daily. Patient not taking: Reported on 05/05/2017 04/29/17   Roxy Horseman, PA-C  montelukast (SINGULAIR) 10 MG tablet Take 1 tablet (10 mg total) at bedtime by mouth. 11/14/16   [name], MD  pantoprazole (PROTONIX) 40 MG tablet Take 1 tablet (40 mg total) by mouth daily. 11/29/16   Molt, Bethany, DO  pravastatin (PRAVACHOL) 80 MG tablet TAKE 1 TABLET (80 MG TOTAL) BY MOUTH DAILY. 11/29/16   Molt, Bethany, DO  RESTASIS MULTIDOSE 0.05 % ophthalmic emulsion PLACE 1 DROP INTO BOTH EYES 2 (TWO) TIMES DAILY. USE ONCE DAILY 03/27/17   Molt, Bethany, DO  warfarin (COUMADIN) 4 MG tablet Take 1 tablet (4mg ) by mouth once-daily at 6pm  on Mondays, Wednesdays and Fridays. On all other days, take only 1/2 tablet. Patient taking differently: Take 2-4 mg by mouth See admin instructions. Take 1 tablet (4mg ) by mouth daily  on Mondays, Wednesdays and Fridays. On all other days, take  1/2 tablet. 03/28/17   Molt, Toma Copier, DO   Past Medical History:  Diagnosis Date  . Arthritis   . Asthma   . Bronchitis   . CHF (congestive heart failure) (HCC)   . DVT, lower extremity, recurrent (HCC)    Patient had unprovoked PE on 2002 and DVT in right lower extremety 2008.  [name] GERD (gastroesophageal reflux disease)   . History of kidney stones   . Hypertension   . PE (pulmonary embolism)    Patient had unprovoked PE on 2002  . PONV (postoperative nausea and vomiting)   . Vertigo    Social History   Socioeconomic History  . Marital status: Divorced    Spouse name: Not on file  . Number of children: Not on file  . Years of education: Not on file  . Highest education level: Not on file  Occupational History  . Not on file  Social Needs  . Financial resource strain: Not on file  . Food insecurity:    Worry: Not on file    Inability: Not on file  . Transportation needs:    Medical: Not on file    Non-medical: Not on file  Tobacco Use  . Smoking status: Former Smoker    Types: Cigarettes    Last attempt to quit: 09/18/1988    Years since quitting: 28.6  . Smokeless tobacco: Never Used  Substance and Sexual Activity  . Alcohol use: No  . Drug use: No  . Sexual activity: Never    Birth control/protection: None  Lifestyle  . Physical activity:    Days per week: Not on file    Minutes per session: Not on file  . Stress: Not on file  Relationships  . Social connections:    Talks on phone: Not on file    Gets together: Not on file    Attends religious service: Not on file    Active member of club or organization: Not on file    Attends meetings of clubs or organizations: Not on file    Relationship status: Not on file  Other  Topics Concern  . Not on file  Social History Narrative  . Not on file   Family History  Problem Relation Age of Onset  . Cancer Mother   . Hypertension Sister   . Hypertension Brother   . Diabetes Brother   . Hypertension Sister   . Colon cancer Other   . Heart Problems Other 34       sister's child, open heart surgery    ASSESSMENT Recent Results: The most recent result is correlated with 20 mg per week:  Lab Results  Component Value Date   INR 3.3 05/15/2017   INR 2.30 05/05/2017   INR 2.8 05/01/2017    Anticoagulation Dosing: Description   Take one (1) of your 4mg  blue-colored warfarin tablets on Wednesdays and Fridays. All other days, take only one-half (1/2) of your 4mg  blue-colored warfarin tablets.      INR today: Supratherapeutic  PLAN Weekly dose was decreased by 10% to 18 mg per week  Patient Instructions  Patient instructed to take medications as defined in the Anti-coagulation Track section of this encounter.  Patient instructed to take today's dose.  Patient verbalized understanding of these instructions.  Patient was instructed to take one (1) of your 4mg  blue-colored warfarin tablets on Wednesdays and Fridays. All other days, take only one-half (1/2) of your 4mg  blue-colored warfarin tablets.     Patient advised to contact clinic or seek medical attention if signs/symptoms of bleeding or thromboembolism occur.  Patient verbalized understanding by repeating back information and was advised to contact me if further medication-related questions arise. Patient was also provided an information handout.  Follow-up Return in about 1 month (around 06/12/2017) for INR f/u @ 1015.  Nolen Mu PharmD PGY1 Pharmacy Practice Resident 05/15/2017 10:30 AM   15 minutes spent face-to-face with the patient during the encounter. 50% of time spent on education. 50% of time was spent on collection of INR sample, interpretation of results, and documentation into the  EMR and [email or web address].

## 2017-05-17 ENCOUNTER — Other Ambulatory Visit: Payer: Self-pay | Admitting: *Deleted

## 2017-05-17 ENCOUNTER — Ambulatory Visit (INDEPENDENT_AMBULATORY_CARE_PROVIDER_SITE_OTHER): Payer: Medicare Other | Admitting: Internal Medicine

## 2017-05-17 ENCOUNTER — Other Ambulatory Visit: Payer: Self-pay

## 2017-05-17 ENCOUNTER — Encounter: Payer: Self-pay | Admitting: Internal Medicine

## 2017-05-17 VITALS — BP 147/58 | HR 66 | Temp 98.0°F | Ht 63.0 in | Wt 288.7 lb

## 2017-05-17 DIAGNOSIS — R739 Hyperglycemia, unspecified: Secondary | ICD-10-CM | POA: Diagnosis not present

## 2017-05-17 DIAGNOSIS — Z114 Encounter for screening for human immunodeficiency virus [HIV]: Secondary | ICD-10-CM | POA: Diagnosis not present

## 2017-05-17 DIAGNOSIS — Z1159 Encounter for screening for other viral diseases: Secondary | ICD-10-CM | POA: Diagnosis not present

## 2017-05-17 DIAGNOSIS — Z0000 Encounter for general adult medical examination without abnormal findings: Secondary | ICD-10-CM | POA: Diagnosis not present

## 2017-05-17 DIAGNOSIS — Z1211 Encounter for screening for malignant neoplasm of colon: Secondary | ICD-10-CM

## 2017-05-17 MED ORDER — RESTASIS MULTIDOSE 0.05 % OP EMUL: 1.0000 [drp] | Freq: Two times a day (BID) | OPHTHALMIC | 0 refills | Status: DC

## 2017-05-17 NOTE — Progress Notes (Signed)
Subjective:   Caroline [name] is a 72 y.o. female who presents for a Medicare Annual Wellness Visit.  The following items have been reviewed and updated today in the appropriate area in the EMR.   Health Risk Assessment  Height, weight, BMI, and BP Visual acuity if needed Depression screen Fall risk / safety level Advance directive discussion Medical and family history were reviewed and updated Updating list of other providers & suppliers Medication reconciliation, including over the counter medicines Cognitive screen Written screening schedule Risk Factor list Personalized health advice, risky behaviors, and treatment advice   Current Social History 05/17/2017    Patient lives with 16 yo granddaughter" in a home which is 1 story. There are not steps up to the entrance the patient uses.   Patient's method of transportation is church member drives her.  The highest level of education was some high school. 9th grade  The patient currently retired.  Identified important Relationships are God, family   Pets : 1 lab/pit mix named Cinnamon   Interests / Fun: Church,TV   Current Stressors: "Me and my granddaughter"   Religious / Personal Beliefs: I'm Holiness and I love people"   Other: "I'd give away my last dime."    Cardiac Risk Factors include: advanced age (>68men, >40 women);dyslipidemia;hypertension;obesity (BMI >30kg/m2);sedentary lifestyle    Objective:    Vitals: BP (!) 147/58 (BP Location: Right Arm, Patient Position: Sitting, Cuff Size: Large)   Pulse 66   Temp 98 F (36.7 C) (Oral)   Ht 5\' 3"  (1.6 m)   Wt 288 lb 11.2 oz (131 kg)   SpO2 98%   BMI 51.14 kg/m   Activities of Daily Living In your present state of health, do you have any difficulty performing the following activities: 05/17/2017 05/01/2017  Hearing? N N  Vision? Y N  Comment Sometimes blurry, drops help -  Difficulty concentrating or making decisions? Y N  Walking or climbing stairs? Y Y    Dressing or bathing? Y Y  Comment - AT TIMES  Doing errands, shopping? Y Y  Comment - AT [employer] and eating ? Y -  Comment 2/2 balance issues -  Using the Toilet? N -  In the past six months, have you accidently leaked urine? Y -  Comment fluid pill -  Do you have problems with loss of bowel control? N -  Managing your Medications? N -  Managing your Finances? N -  Housekeeping or managing your Housekeeping? N -  Some recent data might be hidden    Goals Goals    . Exercise 5x per week (10-15 min per time) (pt-stated)    . Weight (lb) < 267 lb (121.1 kg) (pt-stated)     7 % weight loss       Fall Risk Fall Risk  05/17/2017 05/01/2017 02/08/2017 11/29/2016 10/11/2016  Falls in the past year? Yes Yes No No No  Number falls in past yr: 1 1 - - -  Comment - STOOL SLIPPED FROM UNDER PATIENT - - -  Injury with Fall? No No - - -  Risk for fall due to : Impaired balance/gait;Impaired mobility;Other (Comment) - - - Impaired balance/gait;Impaired mobility  Risk for fall due to: Comment vertigo - - - -  Follow up Education provided;Falls prevention discussed - - - -    Depression Screen PHQ 2/9 Scores 05/17/2017 05/01/2017 02/08/2017 11/29/2016  PHQ - 2 Score 2 0 0 0  PHQ- 9 Score  9 - - -     Cognitive Testing I assessed the patient for cognitive issues and the patient did not have issues with his / her cognition.  Mini-Cog - 05/17/17 1205    Normal clock drawing test?  no    How many words correct?  3       Assessment and Plan:    During the course of the visit the patient was educated and counseled about appropriate screening and preventive services as documented in the assessment and plan.  The printed AVS was given to the patient and included an updated screening schedule, a list of risk factors, and personalized health advice.     Kinnie Feil, RN, BSN with Chinita Pester, RN observing

## 2017-05-17 NOTE — Patient Instructions (Addendum)
Annual Wellness Visit   Medicare Covered Preventative Screenings and Services  Services & Screenings Men and Women Who How Often Need? Date of Last Service Action  Abdominal Aortic Aneurysm Adults with AAA risk factors Once No    Alcohol Misuse and Counseling All Adults Screening once a year if no alcohol misuse. Counseling up to 4 face to face sessions. No    Bone Density Measurement  Adults at risk for osteoporosis Once every 2 yrs No    Lipid Panel Z13.6 All adults without CV disease Once every 5 yrs No    Colorectal Cancer   Stool sample or  Colonoscopy All adults 50 and older   Once every year  Every 10 years Yes    Depression All Adults Once a year  Today   Diabetes Screening Blood glucose, post glucose load, or GTT Z13.1  All adults at risk  Pre-diabetics  Once per year  Twice per year Yes    Diabetes  Self-Management Training All adults Diabetics 10 hrs first year; 2 hours subsequent years. Requires Copay No    Glaucoma  Diabetics  Family history of glaucoma  African Americans 50 yrs +  Hispanic Americans 65 yrs + Annually - requires coppay     Hepatitis C Z72.89 or F19.20  High Risk for HCV  Born between 1945 and 1965  Annually  Once Yes    HIV Z11.4 All adults based on risk  Annually btw ages 101 & 51 regardless of risk  Annually > 65 yrs if at increased risk Yes    Lung Cancer Screening Asymptomatic adults aged 35-77 with 30 pack yr history and current smoker OR quit within the last 15 yrs Annually Must have counseling and shared decision making documentation before first screen No    Medical Nutrition Therapy Adults with   Diabetes  Renal disease  Kidney transplant within past 3 yrs 3 hours first year; 2 hours subsequent years No    Obesity and Counseling All adults Screening once a year Counseling if BMI 30 or higher Yes Today   Tobacco Use Counseling Adults who use tobacco  Up to 8 visits in one year No    Vaccines Z23  Hepatitis  B  Influenza   Pneumonia  Adults   Once  Once every flu season  Two different vaccines separated by one year Flu shot  in Fall    Next Annual Wellness Visit People with Medicare Every year  Today     Services & Screenings Women Who How Often Need  Date of Last Service Action  Mammogram  Z12.31 Women over 40 One baseline ages 23-39. Annually ager 40 yrs+     Pap tests All women Annually if high risk. Every 2 yrs for normal risk women     Screening for cervical cancer with   Pap (Z01.419 nl or Z01.411abnl) &  HPV Z11.51 Women aged 98 to 5 Once every 5 yrs     Screening pelvic and breast exams All women Annually if high risk. Every 2 yrs for normal risk women     Sexually Transmitted Diseases  Chlamydia  Gonorrhea  Syphilis All at risk adults Annually for non pregnant females at increased risk         Services & Screenings Men Who How Ofter Need  Date of Last Service Action  Prostate Cancer - DRE & PSA Men over 50 Annually.  DRE might require a copay.     Sexually Transmitted Diseases  Syphilis All  at risk adults Annually for men at increased risk         Things That May Be Affecting Your Health:  Alcohol  Hearing loss  Pain    Depression  Home Safety  Sexual Health   Diabetes  Lack of physical activity  Stress   Difficulty with daily activities  Loneliness  Tiredness   Drug use  Medicines  Tobacco use   Falls  Motor Vehicle Safety  Weight   Food choices  Oral Health  Other    YOUR PERSONALIZED HEALTH PLAN : 1. Schedule your next subsequent Medicare Wellness visit in one year 2. Attend all of your regular appointments to address your medical issues 3. Complete the preventative screenings and services   Fall Prevention in the Home Falls can cause injuries. They can happen to people of all ages. There are many things you can do to make your home safe and to help prevent falls. What can I do on the outside of my home?  Regularly fix the edges of  walkways and driveways and fix any cracks.  Remove anything that might make you trip as you walk through a door, such as a raised step or threshold.  Trim any bushes or trees on the path to your home.  Use bright outdoor lighting.  Clear any walking paths of anything that might make someone trip, such as rocks or tools.  Regularly check to see if handrails are loose or broken. Make sure that both sides of any steps have handrails.  Any raised decks and porches should have guardrails on the edges.  Have any leaves, snow, or ice cleared regularly.  Use sand or salt on walking paths during winter.  Clean up any spills in your garage right away. This includes oil or grease spills. What can I do in the bathroom?  Use night lights.  Install grab bars by the toilet and in the tub and shower. Do not use towel bars as grab bars.  Use non-skid mats or decals in the tub or shower.  If you need to sit down in the shower, use a plastic, non-slip stool.  Keep the floor dry. Clean up any water that spills on the floor as soon as it happens.  Remove soap buildup in the tub or shower regularly.  Attach bath mats securely with double-sided non-slip rug tape.  Do not have throw rugs and other things on the floor that can make you trip. What can I do in the bedroom?  Use night lights.  Make sure that you have a light by your bed that is easy to reach.  Do not use any sheets or blankets that are too big for your bed. They should not hang down onto the floor.  Have a firm chair that has side arms. You can use this for support while you get dressed.  Do not have throw rugs and other things on the floor that can make you trip. What can I do in the kitchen?  Clean up any spills right away.  Avoid walking on wet floors.  Keep items that you use a lot in easy-to-reach places.  If you need to reach something above you, use a strong step stool that has a grab bar.  Keep electrical cords  out of the way.  Do not use floor polish or wax that makes floors slippery. If you must use wax, use non-skid floor wax.  Do not have throw rugs and other things on the  floor that can make you trip. What can I do with my stairs?  Do not leave any items on the stairs.  Make sure that there are handrails on both sides of the stairs and use them. Fix handrails that are broken or loose. Make sure that handrails are as long as the stairways.  Check any carpeting to make sure that it is firmly attached to the stairs. Fix any carpet that is loose or worn.  Avoid having throw rugs at the top or bottom of the stairs. If you do have throw rugs, attach them to the floor with carpet tape.  Make sure that you have a light switch at the top of the stairs and the bottom of the stairs. If you do not have them, ask someone to add them for you. What else can I do to help prevent falls?  Wear shoes that: ? Do not have high heels. ? Have rubber bottoms. ? Are comfortable and fit you well. ? Are closed at the toe. Do not wear sandals.  If you use a stepladder: ? Make sure that it is fully opened. Do not climb a closed stepladder. ? Make sure that both sides of the stepladder are locked into place. ? Ask someone to hold it for you, if possible.  Clearly mark and make sure that you can see: ? Any grab bars or handrails. ? First and last steps. ? Where the edge of each step is.  Use tools that help you move around (mobility aids) if they are needed. These include: ? Canes. ? Walkers. ? Scooters. ? Crutches.  Turn on the lights when you go into a dark area. Replace any light bulbs as soon as they burn out.  Set up your furniture so you have a clear path. Avoid moving your furniture around.  If any of your floors are uneven, fix them.  If there are any pets around you, be aware of where they are.  Review your medicines with your doctor. Some medicines can make you feel dizzy. This can increase  your chance of falling. Ask your doctor what other things that you can do to help prevent falls. This information is not intended to replace advice given to you by your health care provider. Make sure you discuss any questions you have with your health care provider. Document Released: 10/23/2008 Document Revised: 06/04/2015 Document Reviewed: 01/31/2014 Elsevier Interactive Patient Education  [employer].

## 2017-05-18 ENCOUNTER — Telehealth: Payer: Self-pay | Admitting: *Deleted

## 2017-05-18 LAB — HEMOGLOBIN A1C
ESTIMATED AVERAGE GLUCOSE: 163 mg/dL
HEMOGLOBIN A1C: 7.3 % — AB (ref 4.8–5.6)

## 2017-05-18 LAB — HIV ANTIBODY (ROUTINE TESTING W REFLEX): HIV Screen 4th Generation wRfx: NONREACTIVE

## 2017-05-18 LAB — HEPATITIS C ANTIBODY: Hep C Virus Ab: <0.1 s/co ratio (ref 0.0–0.9)

## 2017-05-18 NOTE — Telephone Encounter (Signed)
-----   Message from Levert Feinstein, MD sent at 05/18/2017  9:11 AM EDT ----- Dr Vincente Liberty ordered: Call pt: hepatitis C screening test negative; HbA1c 7.3%: this is good

## 2017-05-18 NOTE — Telephone Encounter (Signed)
Pt called / informed "hepatitis C screening test negative; HbA1c 7.3%: this is good " per Dr. Cyndie Chime. Stated thank you for calling.

## 2017-05-20 DIAGNOSIS — Z1211 Encounter for screening for malignant neoplasm of colon: Secondary | ICD-10-CM | POA: Diagnosis not present

## 2017-05-23 ENCOUNTER — Telehealth: Payer: Self-pay | Admitting: *Deleted

## 2017-05-23 DIAGNOSIS — R7309 Other abnormal glucose: Secondary | ICD-10-CM

## 2017-05-23 DIAGNOSIS — E782 Mixed hyperlipidemia: Secondary | ICD-10-CM

## 2017-05-23 NOTE — Telephone Encounter (Signed)
Glenda, I do not see a diagnosis on her chart for the restasis ointment, does she have an opthalmologic that prescribes this?    Also in regards to her recent labs, her A1c came back elevated at 7.3% this may indicate diabetes, we need her to return at some point this week or next week for a lab only appointment for repeat labs.  I would like her to be fasting for these labs (BMP and repeat A1c as well as lipid panel since she will be fasting)

## 2017-05-23 NOTE — Telephone Encounter (Signed)
Fax from [facility] - Restasis multi dose is not available. Please send a different rx  And change on med list. Thanks

## 2017-05-23 NOTE — Progress Notes (Addendum)
[facility] Attending Hep C ab is negative However A1c is elevated- concerning for diabetes.  To make this diagnosis will need to have her back for lab only appointment for fasting BMP and repeat A1c.

## 2017-05-24 NOTE — Telephone Encounter (Signed)
Ok, I will defer this refill to her PCP Dr Vincente Liberty. Thank you for scheduling her labs

## 2017-05-24 NOTE — Progress Notes (Signed)
Glenda scheduled patient for lab draw 06/02/2017. Kinnie Feil, RN, BSN

## 2017-05-24 NOTE — Telephone Encounter (Signed)
Dr Cyndie Chime prescribed Restasis on 5/8. Also called pt - no answer; left message to call us back re: if she has an ophthalmologist and labs.

## 2017-05-24 NOTE — Telephone Encounter (Addendum)
Pt stated she has an ophthalmologist  at [facility] on [address]; c/o dry eyes. She will come next Friday 5/24 @ 1030 AM for labs.

## 2017-05-26 ENCOUNTER — Other Ambulatory Visit: Payer: Self-pay | Admitting: Internal Medicine

## 2017-05-30 ENCOUNTER — Telehealth: Payer: Self-pay | Admitting: *Deleted

## 2017-05-30 NOTE — Telephone Encounter (Signed)
-----   Message from [facility], DO sent at 05/24/2017  3:05 PM EDT ----- Regarding: New diagnosis of diabetes Hello,   Patient was seen last week for RN AWV and screening A1c came back elevated suggesting diabetes. Could she please be scheduled sometime within the next week or two to discuss this and repeat the A1c?  Thanks so much!  ----- Message ----- From: Fredderick Severance, RN Sent: 05/17/2017   4:53 PM To: [name], DO  Dr. Vincente Liberty Today's PHQ-9 = 9 Patient states she used to see a counselor in past (2 years ago) and would like to again.

## 2017-05-31 ENCOUNTER — Other Ambulatory Visit: Payer: Medicare Other

## 2017-05-31 ENCOUNTER — Other Ambulatory Visit: Payer: Self-pay | Admitting: Oncology

## 2017-05-31 DIAGNOSIS — Z1211 Encounter for screening for malignant neoplasm of colon: Secondary | ICD-10-CM

## 2017-06-02 ENCOUNTER — Other Ambulatory Visit: Payer: Self-pay

## 2017-06-02 ENCOUNTER — Ambulatory Visit (INDEPENDENT_AMBULATORY_CARE_PROVIDER_SITE_OTHER): Payer: Medicare Other | Admitting: Internal Medicine

## 2017-06-02 VITALS — BP 134/60 | HR 81 | Temp 98.0°F | Ht 63.0 in | Wt 281.0 lb

## 2017-06-02 DIAGNOSIS — R7309 Other abnormal glucose: Secondary | ICD-10-CM | POA: Diagnosis not present

## 2017-06-02 DIAGNOSIS — Z87891 Personal history of nicotine dependence: Secondary | ICD-10-CM | POA: Diagnosis not present

## 2017-06-02 DIAGNOSIS — E118 Type 2 diabetes mellitus with unspecified complications: Secondary | ICD-10-CM | POA: Insufficient documentation

## 2017-06-02 DIAGNOSIS — Z7984 Long term (current) use of oral hypoglycemic drugs: Secondary | ICD-10-CM | POA: Diagnosis not present

## 2017-06-02 DIAGNOSIS — E785 Hyperlipidemia, unspecified: Secondary | ICD-10-CM | POA: Diagnosis not present

## 2017-06-02 DIAGNOSIS — Z833 Family history of diabetes mellitus: Secondary | ICD-10-CM | POA: Diagnosis not present

## 2017-06-02 DIAGNOSIS — Z79899 Other long term (current) drug therapy: Secondary | ICD-10-CM

## 2017-06-02 DIAGNOSIS — E782 Mixed hyperlipidemia: Secondary | ICD-10-CM | POA: Diagnosis not present

## 2017-06-02 LAB — GLUCOSE, CAPILLARY: Glucose-Capillary: 118 mg/dL — ABNORMAL HIGH (ref 65–99)

## 2017-06-02 LAB — POCT GLYCOSYLATED HEMOGLOBIN (HGB A1C): Hemoglobin A1C: 6.9 % — AB (ref 4.0–5.6)

## 2017-06-02 MED ORDER — METFORMIN HCL ER 500 MG PO TB24: 500.0000 mg | ORAL_TABLET | Freq: Every day | ORAL | 0 refills | Status: DC

## 2017-06-02 NOTE — Patient Instructions (Addendum)
Caroline [name], you have been diagnosed with diabetes.  We have started you on a medication called metformin that will help keep your blood sugar under control.  Keep up the good work with the dietary changes you have implemented so far.  To help assist you I have placed a referral to our diabetes educator, she can help reinforce information about your diagnosis and help you come up with a good diet plan that is appropriate to help keep your blood sugars under good control

## 2017-06-02 NOTE — Assessment & Plan Note (Signed)
Pt reports she was told she may have diabetes a few weeks prior.  Since then she reports she has lost 8lbs since last visit cut out the chocolate, potato chips and starches.  More fish mostly baked but some fried, less fried chicken more baked.   She is here to follow up on her newly diagnosed diabetes.   Screening A1C 7.1, repeat today 6.9.  -spent some time educating pt on what diabetes is, what her A1C means and how we proceed from here -Pt interested in diabetes education referral will place referral -will start pt on metformin 500mg  daily

## 2017-06-02 NOTE — Progress Notes (Signed)
CC: follow up of elevated Hgb A1C  HPI:  [name] is a 72 y.o. female with PMH below.  She is here to follow up on an elevated A1C done during a wellness screening.  Please see A&P for status of the patient's chronic medical conditions  Past Medical History:  Diagnosis Date  . Arthritis   . Asthma   . Bronchitis   . CHF (congestive heart failure) (HCC)   . DVT, lower extremity, recurrent (HCC)    Patient had unprovoked PE on 2002 and DVT in right lower extremety 2008.  [name] GERD (gastroesophageal reflux disease)   . History of kidney stones   . Hypertension   . PE (pulmonary embolism)    Patient had unprovoked PE on 2002  . PONV (postoperative nausea and vomiting)   . Vertigo    Review of Systems:  ROS: Pulmonary: pt denies increased work of breathing, shortness of breath,  Cardiac: pt denies palpitations, chest pain,  Abdominal: pt denies abdominal pain, nausea, vomiting, or diarrhea  Physical Exam:  Vitals:   06/02/17 1008  BP: 134/60  Pulse: 81  Temp: 98 F (36.7 C)  TempSrc: Oral  SpO2: 98%  Weight: 281 lb (127.5 kg)  Height: 5\' 3"  (1.6 m)   Physical Exam  Constitutional: No distress.  Cardiovascular: Normal rate, regular rhythm and normal heart sounds. Exam reveals no gallop and no friction rub.  No murmur heard. Pulmonary/Chest: Effort normal and breath sounds normal. No respiratory distress. She has no wheezes. She has no rales. She exhibits no tenderness.  Abdominal: Soft. Bowel sounds are normal. She exhibits no distension and no mass. There is no tenderness. There is no rebound and no guarding.  Neurological: She is alert.  Skin: She is not diaphoretic.    Social History   Socioeconomic History  . Marital status: Divorced    Spouse name: Not on file  . Number of children: 5  . Years of education: 9 th grade  . Highest education level: 9th grade  Occupational History  . Occupation: Merchandiser, retail  . Financial resource strain:  Somewhat hard  . Food insecurity:    Worry: Never true    Inability: Sometimes true  . Transportation needs:    Medical: No    Non-medical: Yes  Tobacco Use  . Smoking status: Former Smoker    Types: Cigarettes    Last attempt to quit: 09/18/1988    Years since quitting: 28.7  . Smokeless tobacco: Never Used  Substance and Sexual Activity  . Alcohol use: No  . Drug use: No  . Sexual activity: Never    Birth control/protection: None  Lifestyle  . Physical activity:    Days per week: 2 days    Minutes per session: 20 min  . Stress: To some extent  Relationships  . Social connections:    Talks on phone: More than three times a week    Gets together: Once a week    Attends religious service: More than 4 times per year    Active member of club or organization: No    Attends meetings of clubs or organizations: Never    Relationship status: Divorced  . Intimate partner violence:    Fear of current or ex partner: Not on file    Emotionally abused: Not on file    Physically abused: Not on file    Forced sexual activity: Not on file  Other Topics Concern  . Not on file  Social History Narrative   Current Social History 05/17/2017        Patient lives with 56 yo granddaughter" in a/an home / condo / townhome which is 1 story/stories. There are not steps up to the entrance the patient uses.       Patient's method of transportation is church member.      The highest level of education was some high school.      The patient currently retired.      Identified important Relationships are God, family       Pets : 1 lab/pitt mix named Cinnamon       Interests / Fun: Church,TV       Current Stressors: "Me and my granddaughter"       Religious / Personal Beliefs: I'm Holiness and I love people"       Other: "I'd give away my last dime."     Family History  Problem Relation Age of Onset  . Cancer Mother   . Hypertension Sister   . Diabetes Sister   . Hypertension Brother     . Diabetes Brother   . Hypertension Sister   . Diabetes Sister   . Colon cancer Other   . Heart Problems Other 34       sister's child, open heart surgery  . Stroke Father   . Diabetes Son   . Hypertension Son   . Kidney disease Son        on dialysis  . Hypertension Sister   . Diabetes Sister   . Hypertension Sister   . Diabetes Sister   . Cancer Brother   . Hypertension Son   . Diabetes Son   . Hypertension Daughter   . Schizophrenia Daughter     Assessment & Plan:   See Encounters Tab for problem based charting.  Patient discussed with Dr. Heide Spark

## 2017-06-02 NOTE — Progress Notes (Signed)
Internal Medicine Clinic Attending  Case discussed with Dr. Winfrey  at the time of the visit.  We reviewed the resident's history and exam and pertinent patient test results.  I agree with the assessment, diagnosis, and plan of care documented in the resident's note.  

## 2017-06-02 NOTE — Assessment & Plan Note (Signed)
Elevated A1C on wellness screening follow up A1C also elevated, diagnosis of diabetes made.

## 2017-06-02 NOTE — Assessment & Plan Note (Signed)
Lab Results  Component Value Date   CHOL 205 (H) 06/21/2016   HDL 44 06/21/2016   LDLCALC 128 (H) 06/21/2016   TRIG 165 (H) 06/21/2016   CHOLHDL 4.7 (H) 06/21/2016   Pt taking pravastatin as prescribed 80mg  daily.  New lipid panel placed as a future order for this visit, I will honor this.  Pt is fasting today.  -lipid panel

## 2017-06-03 LAB — FECAL OCCULT BLOOD, IMMUNOCHEMICAL: FECAL OCCULT BLD: NEGATIVE

## 2017-06-03 LAB — BMP8+ANION GAP
ANION GAP: 12 mmol/L (ref 10.0–18.0)
BUN/Creatinine Ratio: 18 (ref 12–28)
BUN: 14 mg/dL (ref 8–27)
CALCIUM: 9.8 mg/dL (ref 8.7–10.3)
CO2: 29 mmol/L (ref 20–29)
CREATININE: 0.77 mg/dL (ref 0.57–1.00)
Chloride: 102 mmol/L (ref 96–106)
GFR, EST AFRICAN AMERICAN: 90 mL/min/{1.73_m2} (ref >59)
GFR, EST NON AFRICAN AMERICAN: 78 mL/min/{1.73_m2} (ref >59)
Glucose: 112 mg/dL — ABNORMAL HIGH (ref 65–99)
POTASSIUM: 4 mmol/L (ref 3.5–5.2)
Sodium: 143 mmol/L (ref 134–144)

## 2017-06-03 LAB — LIPID PANEL
Chol/HDL Ratio: 4 ratio (ref 0.0–4.4)
Cholesterol, Total: 197 mg/dL (ref 100–199)
HDL: 49 mg/dL
LDL Calculated: 122 mg/dL — ABNORMAL HIGH (ref 0–99)
Triglycerides: 130 mg/dL (ref 0–149)
VLDL Cholesterol Cal: 26 mg/dL (ref 5–40)

## 2017-06-08 ENCOUNTER — Telehealth: Payer: Self-pay | Admitting: Internal Medicine

## 2017-06-08 DIAGNOSIS — E7800 Pure hypercholesterolemia, unspecified: Secondary | ICD-10-CM

## 2017-06-08 MED ORDER — ROSUVASTATIN CALCIUM 20 MG PO TABS: 20.0000 mg | ORAL_TABLET | Freq: Every day | ORAL | 3 refills | Status: DC

## 2017-06-08 NOTE — Telephone Encounter (Signed)
Spoke with patient about cholesterol results and the need for intensification of her statin.  Also spoke to Dr. Vincente Liberty about change who is agreeable.  Patient will switch to crestor 20mg  and understands to stop taking the pravastatin once she begins.

## 2017-06-12 ENCOUNTER — Ambulatory Visit (INDEPENDENT_AMBULATORY_CARE_PROVIDER_SITE_OTHER): Payer: Medicare Other | Admitting: Pharmacist

## 2017-06-12 DIAGNOSIS — I82409 Acute embolism and thrombosis of unspecified deep veins of unspecified lower extremity: Secondary | ICD-10-CM

## 2017-06-12 DIAGNOSIS — Z86718 Personal history of other venous thrombosis and embolism: Secondary | ICD-10-CM

## 2017-06-12 DIAGNOSIS — Z7901 Long term (current) use of anticoagulants: Secondary | ICD-10-CM

## 2017-06-12 LAB — POCT INR: INR: 2.2 (ref 2.0–3.0)

## 2017-06-12 NOTE — Progress Notes (Signed)
Anticoagulation Management Caroline [name] is a 72 y.o. female who reports to the clinic for monitoring of warfarin treatment.    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  Duration: indefinite Supervising physician: Blanch Media  Anticoagulation Clinic Visit History: Patient does not report signs/symptoms of bleeding or thromboembolism  Other recent changes: No diet, medications, lifestyle changes endorsed other than as noted in patient findings.  Anticoagulation Episode Summary    Current INR goal:   2.0-3.0  TTR:   80.8 % (2.4 y)  Next INR check:   07/10/2017  INR from last check:   2.2 (06/12/2017)  Weekly max warfarin dose:     Target end date:     INR check location:   Anticoagulation Clinic  Preferred lab:     Send INR reminders to:   [facility]   Indications   DVT lower extremity recurrent (HCC) [I82.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 Penicillins Anaphylaxis, Hives, Swelling and Rash    Has patient had a PCN reaction causing immediate rash, facial/tongue/throat swelling, SOB or lightheadedness with hypotension: Yes Has patient had a PCN reaction causing severe rash involving mucus membranes or skin necrosis: Yes Has patient had a PCN reaction that required hospitalization Yes Has patient had a PCN reaction occurring within the last 10 years: No If all of the above answers are "NO", then may proceed with Cephalosporin use.   . Cabbage Itching  . Keflex [Cephalexin] Hives    And feeling of throat tightness  . Shellfish Allergy Swelling  . Tomato Swelling  . Latex Itching and Rash   Prior to Admission medications   Medication Sig Start Date End Date Taking? Authorizing Provider  albuterol (PROAIR HFA) 108 (90 Base) MCG/ACT inhaler Inhale 2 puffs into the lungs every 6 (six)  hours as needed for wheezing or shortness of breath. 04/19/17  Yes Molt, Bethany, DO  budesonide-formoterol (SYMBICORT) 160-4.5 MCG/ACT inhaler Inhale 2 puffs into the lungs 2 (two) times daily. 02/08/17  Yes Molt, Bethany, DO  calcium citrate-vitamin D (CITRACAL+D) 315-200 MG-UNIT tablet Take 2 tablets daily by mouth. 11/14/16  Yes [name], MD  carvedilol (COREG) 12.5 MG tablet Take 1 tablet (12.5 mg total) by mouth 2 (two) times daily. 11/29/16 04/29/25 Yes Molt, Bethany, DO  famotidine (PEPCID) 20 MG tablet Take 1 tablet (20 mg total) by mouth 2 (two) times daily. 04/07/17  Yes Renne Crigler, PA-C  fluticasone (FLONASE) 50 MCG/ACT nasal spray PLACE 2 SPRAYS DAILY INTO BOTH NOSTRILS. Patient taking differently: Place 2 sprays into both nostrils 2 (two) times daily.  12/21/16  Yes Molt, Bethany, DO  furosemide (LASIX) 40 MG tablet Take 1 tablet (40 mg total) by mouth every other day. 11/29/16  Yes Molt, Bethany, DO  losartan (COZAAR) 100 MG tablet Take 1 tablet (100 mg total) by mouth daily. 11/29/16  Yes Molt, Bethany, DO  Magnesium Oxide 400 MG CAPS Take 1 capsule (400 mg total) by mouth 2 (two) times daily. 01/26/17  Yes Camnitz, Will [name], MD  meclizine (ANTIVERT) 25 MG tablet Take 1 tablet (25 mg total) 3 (three) times daily as needed by mouth. 11/23/16  Yes Molt, Bethany, DO  metFORMIN (GLUCOPHAGE-XR) 500 MG 24 hr tablet Take 1 tablet (500 mg total) by mouth daily with breakfast. 06/02/17  Yes Winfrey,  Kimberlee Nearing, MD  montelukast (SINGULAIR) 10 MG tablet Take 1 tablet (10 mg total) at bedtime by mouth. 11/14/16  Yes [name], MD  pantoprazole (PROTONIX) 40 MG tablet TAKE 1 TABLET BY MOUTH EVERY DAY 05/27/17  Yes Molt, Bethany, DO  RESTASIS 0.05 % ophthalmic emulsion Please specify directions, refills and quantity 06/02/17  Yes Molt, Bethany, DO  rosuvastatin (CRESTOR) 20 MG tablet Take 1 tablet (20 mg total) by mouth daily. 06/08/17  Yes Angelita Ingles, MD  warfarin (COUMADIN) 4 MG  tablet Take 1 tablet (4mg ) by mouth once-daily at 6pm on Mondays, Wednesdays and Fridays. On all other days, take only 1/2 tablet. Patient taking differently: Take 2-4 mg by mouth See admin instructions. Take 1 tablet (4mg ) by mouth daily  on Mondays, Wednesdays and Fridays. On all other days, take  1/2 tablet. 03/28/17  Yes Molt, Toma Copier, DO   Past Medical History:  Diagnosis Date  . Arthritis   . Asthma   . Bronchitis   . CHF (congestive heart failure) (HCC)   . DVT, lower extremity, recurrent (HCC)    Patient had unprovoked PE on 2002 and DVT in right lower extremety 2008.  [name] GERD (gastroesophageal reflux disease)   . History of kidney stones   . Hypertension   . PE (pulmonary embolism)    Patient had unprovoked PE on 2002  . PONV (postoperative nausea and vomiting)   . Vertigo    Social History   Socioeconomic History  . Marital status: Divorced    Spouse name: Not on file  . Number of children: 5  . Years of education: 9 th grade  . Highest education level: 9th grade  Occupational History  . Occupation: Merchandiser, retail  . Financial resource strain: Somewhat hard  . Food insecurity:    Worry: Never true    Inability: Sometimes true  . Transportation needs:    Medical: No    Non-medical: Yes  Tobacco Use  . Smoking status: Former Smoker    Types: Cigarettes    Last attempt to quit: 09/18/1988    Years since quitting: 28.7  . Smokeless tobacco: Never Used  Substance and Sexual Activity  . Alcohol use: No  . Drug use: No  . Sexual activity: Never    Birth control/protection: None  Lifestyle  . Physical activity:    Days per week: 2 days    Minutes per session: 20 min  . Stress: To some extent  Relationships  . Social connections:    Talks on phone: More than three times a week    Gets together: Once a week    Attends religious service: More than 4 times per year    Active member of club or organization: No    Attends meetings of clubs or organizations:  Never    Relationship status: Divorced  Other Topics Concern  . Not on file  Social History Narrative   Current Social History 05/17/2017        Patient lives with 13 yo granddaughter" in a/an home / condo / townhome which is 1 story/stories. There are not steps up to the entrance the patient uses.       Patient's method of transportation is church member.      The highest level of education was some high school.      The patient currently retired.      Identified important Relationships are God, family       Pets : 1 lab/pitt  mix named Engineer, materials / Fun: Church,TV       Current Stressors: "Me and my granddaughter"       Religious / Personal Beliefs: I'm Holiness and I love people"       Other: "I'd give away my last dime."    Family History  Problem Relation Age of Onset  . Cancer Mother   . Hypertension Sister   . Diabetes Sister   . Hypertension Brother   . Diabetes Brother   . Hypertension Sister   . Diabetes Sister   . Colon cancer Other   . Heart Problems Other 34       sister's child, open heart surgery  . Stroke Father   . Diabetes Son   . Hypertension Son   . Kidney disease Son        on dialysis  . Hypertension Sister   . Diabetes Sister   . Hypertension Sister   . Diabetes Sister   . Cancer Brother   . Hypertension Son   . Diabetes Son   . Hypertension Daughter   . Schizophrenia Daughter     ASSESSMENT Recent Results: The most recent result is correlated with 18 mg per week: Lab Results  Component Value Date   INR 2.2 06/12/2017   INR 3.3 05/15/2017   INR 2.30 05/05/2017    Anticoagulation Dosing: Description   Take one (1) of your 4mg  blue-colored warfarin tablets on Wednesdays and Fridays. All other days, take only one-half (1/2) of your 4mg  blue-colored warfarin tablets.      INR today: Therapeutic  PLAN Weekly dose was unchanged.  Patient Instructions  Patient instructed to take medications as defined in the  Anti-coagulation Track section of this encounter.  Patient instructed to take today's dose.  Patient instructed to take one (1) of your 4mg  blue-colored warfarin tablets on Wednesdays and Fridays. All other days, take only one-half (1/2) of your 4mg  blue-colored warfarin tablets. Patient verbalized understanding of these instructions.     Patient advised to contact clinic or seek medical attention if signs/symptoms of bleeding or thromboembolism occur.  Patient verbalized understanding by repeating back information and was advised to contact me if further medication-related questions arise. Patient was also provided an information handout.  Follow-up Return in 1 month (on 07/10/2017) for Follow up INR at 1015h.  Elicia Lamp, PharmD, CACP, CPP  15 minutes spent face-to-face with the patient during the encounter. 50% of time spent on education. 50% of time was spent on fingerstick point of care INR sample collection, processing, results determination, documentation in [email or web address]

## 2017-06-12 NOTE — Patient Instructions (Signed)
Patient instructed to take medications as defined in the Anti-coagulation Track section of this encounter.  Patient instructed to take today's dose.  Patient instructed to take one (1) of your 4mg  blue-colored warfarin tablets on Wednesdays and Fridays. All other days, take only one-half (1/2) of your 4mg  blue-colored warfarin tablets. Patient verbalized understanding of these instructions.

## 2017-06-13 NOTE — Telephone Encounter (Signed)
A user error has taken place: encounter opened in error, closed for administrative reasons.

## 2017-06-19 ENCOUNTER — Other Ambulatory Visit: Payer: Self-pay | Admitting: Internal Medicine

## 2017-06-19 NOTE — Telephone Encounter (Signed)
Next appt scheduled 7/30 with PCP. 

## 2017-07-04 DIAGNOSIS — I8310 Varicose veins of unspecified lower extremity with inflammation: Secondary | ICD-10-CM | POA: Diagnosis not present

## 2017-07-10 ENCOUNTER — Ambulatory Visit (INDEPENDENT_AMBULATORY_CARE_PROVIDER_SITE_OTHER): Payer: Medicare Other | Admitting: Pharmacist

## 2017-07-10 DIAGNOSIS — I82409 Acute embolism and thrombosis of unspecified deep veins of unspecified lower extremity: Secondary | ICD-10-CM | POA: Diagnosis not present

## 2017-07-10 DIAGNOSIS — Z7901 Long term (current) use of anticoagulants: Secondary | ICD-10-CM

## 2017-07-10 DIAGNOSIS — Z5181 Encounter for therapeutic drug level monitoring: Secondary | ICD-10-CM | POA: Diagnosis not present

## 2017-07-10 DIAGNOSIS — Z86711 Personal history of pulmonary embolism: Secondary | ICD-10-CM | POA: Diagnosis not present

## 2017-07-10 LAB — POCT INR: INR: 2.2 (ref 2.0–3.0)

## 2017-07-10 NOTE — Progress Notes (Signed)
Anticoagulation Management Caroline [name] is a 72 y.o. female who reports to the clinic for monitoring of warfarin treatment.    Indication: DVT, lower extremity, recurrent; long term current use of anticoagulants.   Duration: indefinite Supervising physician: Carlynn Purl  Anticoagulation Clinic Visit History: Patient does not report signs/symptoms of bleeding or thromboembolism  Other recent changes: No diet, medications, lifestyle endorsed.  Anticoagulation Episode Summary    Current INR goal:   2.0-3.0  TTR:   81.4 % (2.4 y)  Next INR check:   08/07/2017  INR from last check:   2.2 (07/10/2017)  Weekly max warfarin dose:     Target end date:     INR check location:   Anticoagulation Clinic  Preferred lab:     Send INR reminders to:   [facility]   Indications   DVT lower extremity recurrent (HCC) [I82.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 Penicillins Anaphylaxis, Hives, Swelling and Rash    Has patient had a PCN reaction causing immediate rash, facial/tongue/throat swelling, SOB or lightheadedness with hypotension: Yes Has patient had a PCN reaction causing severe rash involving mucus membranes or skin necrosis: Yes Has patient had a PCN reaction that required hospitalization Yes Has patient had a PCN reaction occurring within the last 10 years: No If all of the above answers are "NO", then may proceed with Cephalosporin use.   . Cabbage Itching  . Keflex [Cephalexin] Hives    And feeling of throat tightness  . Shellfish Allergy Swelling  . Tomato Swelling  . Latex Itching and Rash   Prior to Admission medications   Medication Sig Start Date End Date Taking? Authorizing Provider  albuterol (PROAIR HFA) 108 (90 Base) MCG/ACT inhaler Inhale 2 puffs into the lungs every 6 (six) hours as needed for wheezing or shortness of breath. 04/19/17  Yes Molt, Bethany, DO  budesonide-formoterol (SYMBICORT)  160-4.5 MCG/ACT inhaler Inhale 2 puffs into the lungs 2 (two) times daily. 02/08/17  Yes Molt, Bethany, DO  calcium citrate-vitamin D (CITRACAL+D) 315-200 MG-UNIT tablet Take 2 tablets daily by mouth. 11/14/16  Yes [name], MD  carvedilol (COREG) 12.5 MG tablet Take 1 tablet (12.5 mg total) by mouth 2 (two) times daily. 11/29/16 04/29/25 Yes Molt, Bethany, DO  famotidine (PEPCID) 20 MG tablet Take 1 tablet (20 mg total) by mouth 2 (two) times daily. 04/07/17  Yes Renne Crigler, PA-C  fluticasone (FLONASE) 50 MCG/ACT nasal spray PLACE 2 SPRAYS DAILY INTO BOTH NOSTRILS. 06/19/17  Yes Molt, Bethany, DO  furosemide (LASIX) 40 MG tablet Take 1 tablet (40 mg total) by mouth every other day. 11/29/16  Yes Molt, Bethany, DO  losartan (COZAAR) 100 MG tablet Take 1 tablet (100 mg total) by mouth daily. 11/29/16  Yes Molt, Bethany, DO  Magnesium Oxide 400 MG CAPS Take 1 capsule (400 mg total) by mouth 2 (two) times daily. 01/26/17  Yes Camnitz, Will [name], MD  meclizine (ANTIVERT) 25 MG tablet Take 1 tablet (25 mg total) 3 (three) times daily as needed by mouth. 11/23/16  Yes Molt, Bethany, DO  metFORMIN (GLUCOPHAGE-XR) 500 MG 24 hr tablet Take 1 tablet (500 mg total) by mouth daily with breakfast. 06/02/17  Yes Winfrey, Kimberlee Nearing, MD  montelukast (SINGULAIR) 10 MG tablet Take 1 tablet (10 mg total) at bedtime by mouth. 11/14/16  Yes [name], MD  pantoprazole (PROTONIX) 40  MG tablet TAKE 1 TABLET BY MOUTH EVERY DAY 05/27/17  Yes Molt, Bethany, DO  RESTASIS 0.05 % ophthalmic emulsion Please specify directions, refills and quantity 06/02/17  Yes Molt, Bethany, DO  rosuvastatin (CRESTOR) 20 MG tablet Take 1 tablet (20 mg total) by mouth daily. 06/08/17  Yes Angelita Ingles, MD  warfarin (COUMADIN) 4 MG tablet Take 1 tablet (4mg ) by mouth once-daily at 6pm on Mondays, Wednesdays and Fridays. On all other days, take only 1/2 tablet. Patient taking differently: Take 2-4 mg by mouth See admin instructions.  Take 1 tablet (4mg ) by mouth daily  on Mondays, Wednesdays and Fridays. On all other days, take  1/2 tablet. 03/28/17  Yes Molt, Toma Copier, DO   Past Medical History:  Diagnosis Date  . Arthritis   . Asthma   . Bronchitis   . CHF (congestive heart failure) (HCC)   . DVT, lower extremity, recurrent (HCC)    Patient had unprovoked PE on 2002 and DVT in right lower extremety 2008.  [name] GERD (gastroesophageal reflux disease)   . History of kidney stones   . Hypertension   . PE (pulmonary embolism)    Patient had unprovoked PE on 2002  . PONV (postoperative nausea and vomiting)   . Vertigo    Social History   Socioeconomic History  . Marital status: Divorced    Spouse name: Not on file  . Number of children: 5  . Years of education: 9 th grade  . Highest education level: 9th grade  Occupational History  . Occupation: Merchandiser, retail  . Financial resource strain: Somewhat hard  . Food insecurity:    Worry: Never true    Inability: Sometimes true  . Transportation needs:    Medical: No    Non-medical: Yes  Tobacco Use  . Smoking status: Former Smoker    Types: Cigarettes    Last attempt to quit: 09/18/1988    Years since quitting: 28.8  . Smokeless tobacco: Never Used  Substance and Sexual Activity  . Alcohol use: No  . Drug use: No  . Sexual activity: Never    Birth control/protection: None  Lifestyle  . Physical activity:    Days per week: 2 days    Minutes per session: 20 min  . Stress: To some extent  Relationships  . Social connections:    Talks on phone: More than three times a week    Gets together: Once a week    Attends religious service: More than 4 times per year    Active member of club or organization: No    Attends meetings of clubs or organizations: Never    Relationship status: Divorced  Other Topics Concern  . Not on file  Social History Narrative   Current Social History 05/17/2017        Patient lives with 37 yo granddaughter" in a/an home /  condo / townhome which is 1 story/stories. There are not steps up to the entrance the patient uses.       Patient's method of transportation is church member.      The highest level of education was some high school.      The patient currently retired.      Identified important Relationships are God, family       Pets : 1 lab/pitt mix named Cinnamon       Interests / Fun: Church,TV       Current Stressors: "Me and my granddaughter"  Religious / Personal Beliefs: I'm Holiness and I love people"       Other: "I'd give away my last dime."    Family History  Problem Relation Age of Onset  . Cancer Mother   . Hypertension Sister   . Diabetes Sister   . Hypertension Brother   . Diabetes Brother   . Hypertension Sister   . Diabetes Sister   . Colon cancer Other   . Heart Problems Other 34       sister's child, open heart surgery  . Stroke Father   . Diabetes Son   . Hypertension Son   . Kidney disease Son        on dialysis  . Hypertension Sister   . Diabetes Sister   . Hypertension Sister   . Diabetes Sister   . Cancer Brother   . Hypertension Son   . Diabetes Son   . Hypertension Daughter   . Schizophrenia Daughter     ASSESSMENT Recent Results: The most recent result is correlated with 18 mg per week: Lab Results  Component Value Date   INR 2.2 07/10/2017   INR 2.2 06/12/2017   INR 3.3 05/15/2017    Anticoagulation Dosing: Description   Take one (1) of your 4mg  blue-colored warfarin tablets on Mondays, Wednesdays and Fridays. All other days, take only one-half (1/2) of your 4mg  blue-colored warfarin tablets.      INR today: Therapeutic  PLAN Weekly dose was increased by 11% to 20 mg per week  Patient Instructions  Patient instructed to take medications as defined in the Anti-coagulation Track section of this encounter.  Patient instructed to take today's dose.  Patient instructed to take one (1) of your 4mg  blue-colored warfarin tablets on  Mondays, Wednesdays and Fridays. All other days, take only one-half (1/2) of your 4mg  blue-colored warfarin tablets. Patient verbalized understanding of these instructions.     Patient advised to contact clinic or seek medical attention if signs/symptoms of bleeding or thromboembolism occur.  Patient verbalized understanding by repeating back information and was advised to contact me if further medication-related questions arise. Patient was also provided an information handout.  Follow-up Return in 1 month (on 08/07/2017) for Follow up INR at 1030h.  Elicia Lamp, PharmD, CACP, CPP  15 minutes spent face-to-face with the patient during the encounter. 50% of time spent on education. 50% of time was spent on fingerstick point of care INR sample collection, processing, results determination, dose adjustment and documentation in [email or web address]

## 2017-07-10 NOTE — Patient Instructions (Signed)
Patient instructed to take medications as defined in the Anti-coagulation Track section of this encounter.  Patient instructed to take today's dose.  Patient instructed to take one (1) of your 4mg blue-colored warfarin tablets on Mondays, Wednesdays and Fridays. All other days, take only one-half (1/2) of your 4mg blue-colored warfarin tablets.  Patient verbalized understanding of these instructions.    

## 2017-07-11 NOTE — Progress Notes (Signed)
INTERNAL MEDICINE TEACHING ATTENDING ADDENDUM - Gust Rung, DO Duration- indefinate, Indication- recurrent VTE, INR-  Lab Results  Component Value Date   INR 2.2 07/10/2017  . Agree with pharmacy recommendations as outlined in their note.

## 2017-07-12 ENCOUNTER — Other Ambulatory Visit: Payer: Self-pay | Admitting: Internal Medicine

## 2017-07-12 DIAGNOSIS — R42 Dizziness and giddiness: Secondary | ICD-10-CM

## 2017-07-12 NOTE — Telephone Encounter (Signed)
Next appt scheduled 8/20 with PCP. 

## 2017-07-18 ENCOUNTER — Other Ambulatory Visit: Payer: Self-pay | Admitting: Internal Medicine

## 2017-07-18 DIAGNOSIS — I82409 Acute embolism and thrombosis of unspecified deep veins of unspecified lower extremity: Secondary | ICD-10-CM

## 2017-08-02 ENCOUNTER — Other Ambulatory Visit: Payer: Self-pay | Admitting: Cardiology

## 2017-08-07 ENCOUNTER — Ambulatory Visit (INDEPENDENT_AMBULATORY_CARE_PROVIDER_SITE_OTHER): Payer: Medicare Other | Admitting: Pharmacist

## 2017-08-07 DIAGNOSIS — Z5181 Encounter for therapeutic drug level monitoring: Secondary | ICD-10-CM | POA: Diagnosis not present

## 2017-08-07 DIAGNOSIS — Z7901 Long term (current) use of anticoagulants: Secondary | ICD-10-CM

## 2017-08-07 DIAGNOSIS — Z86718 Personal history of other venous thrombosis and embolism: Secondary | ICD-10-CM

## 2017-08-07 DIAGNOSIS — I82409 Acute embolism and thrombosis of unspecified deep veins of unspecified lower extremity: Secondary | ICD-10-CM

## 2017-08-07 LAB — POCT INR: INR: 2.6 (ref 2.0–3.0)

## 2017-08-07 NOTE — Patient Instructions (Signed)
Patient instructed to take medications as defined in the Anti-coagulation Track section of this encounter.  Patient instructed to take today's dose.  Patient instructed to take one (1) of your 4mg  blue-colored warfarin tablets on Mondays, Wednesdays and Fridays. All other days, take only one-half (1/2) of your 4mg  blue-colored warfarin tablets.  Patient verbalized understanding of these instructions.

## 2017-08-07 NOTE — Progress Notes (Signed)
Anticoagulation Management Caroline [name] is a 72 y.o. female who reports to the clinic for monitoring of warfarin treatment.    Indication: DVT, lower extremity, recurrent; PE, history of; long term current use of anticoagulant.  Duration: indefinite Supervising physician: Debe Coder  Anticoagulation Clinic Visit History: Patient does not report signs/symptoms of bleeding or thromboembolism  Other recent changes: No diet, medications, lifestyle changes.  Anticoagulation Episode Summary    Current INR goal:   2.0-3.0  TTR:   81.9 % (2.5 y)  Next INR check:   09/04/2017  INR from last check:   2.6 (08/07/2017)  Weekly max warfarin dose:     Target end date:     INR check location:   Anticoagulation Clinic  Preferred lab:     Send INR reminders to:   [facility]   Indications   DVT lower extremity recurrent (HCC) [I82.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 Penicillins Anaphylaxis, Hives, Swelling and Rash    Has patient had a PCN reaction causing immediate rash, facial/tongue/throat swelling, SOB or lightheadedness with hypotension: Yes Has patient had a PCN reaction causing severe rash involving mucus membranes or skin necrosis: Yes Has patient had a PCN reaction that required hospitalization Yes Has patient had a PCN reaction occurring within the last 10 years: No If all of the above answers are "NO", then may proceed with Cephalosporin use.   . Cabbage Itching  . Keflex [Cephalexin] Hives    And feeling of throat tightness  . Shellfish Allergy Swelling  . Tomato Swelling  . Latex Itching and Rash   Prior to Admission medications   Medication Sig Start Date End Date Taking? Authorizing Provider  albuterol (PROAIR HFA) 108 (90 Base) MCG/ACT inhaler Inhale 2 puffs into the lungs every 6 (six) hours as needed for wheezing or shortness of breath. 04/19/17  Yes Molt, Bethany, DO   budesonide-formoterol (SYMBICORT) 160-4.5 MCG/ACT inhaler Inhale 2 puffs into the lungs 2 (two) times daily. 02/08/17  Yes Molt, Bethany, DO  calcium citrate-vitamin D (CITRACAL+D) 315-200 MG-UNIT tablet Take 2 tablets daily by mouth. 11/14/16  Yes [name], MD  carvedilol (COREG) 12.5 MG tablet TAKE 1 TABLET (12.5 MG TOTAL) BY MOUTH 2 (TWO) TIMES DAILY. 08/03/17 11/01/17 Yes Camnitz, Will [name], MD  famotidine (PEPCID) 20 MG tablet Take 1 tablet (20 mg total) by mouth 2 (two) times daily. 04/07/17  Yes Renne Crigler, PA-C  fluticasone (FLONASE) 50 MCG/ACT nasal spray PLACE 2 SPRAYS DAILY INTO BOTH NOSTRILS. 06/19/17  Yes Molt, Bethany, DO  furosemide (LASIX) 40 MG tablet Take 1 tablet (40 mg total) by mouth every other day. 11/29/16  Yes Molt, Bethany, DO  losartan (COZAAR) 100 MG tablet Take 1 tablet (100 mg total) by mouth daily. 11/29/16  Yes Molt, Bethany, DO  Magnesium Oxide 400 MG CAPS Take 1 capsule (400 mg total) by mouth 2 (two) times daily. 01/26/17  Yes Camnitz, Will [name], MD  meclizine (ANTIVERT) 25 MG tablet TAKE 1 TABLET BY MOUTH 3 TIMES DAILY AS NEEDED 07/12/17  Yes Molt, Bethany, DO  metFORMIN (GLUCOPHAGE-XR) 500 MG 24 hr tablet Take 1 tablet (500 mg total) by mouth daily with breakfast. 06/02/17  Yes Winfrey, Kimberlee Nearing, MD  montelukast (SINGULAIR) 10 MG tablet Take 1 tablet (10 mg total) at bedtime by mouth. 11/14/16  Yes [name], MD  pantoprazole (PROTONIX) 40 MG  tablet TAKE 1 TABLET BY MOUTH EVERY DAY 05/27/17  Yes Molt, Bethany, DO  RESTASIS 0.05 % ophthalmic emulsion Please specify directions, refills and quantity 06/02/17  Yes Molt, Bethany, DO  rosuvastatin (CRESTOR) 20 MG tablet Take 1 tablet (20 mg total) by mouth daily. 06/08/17  Yes Angelita Ingles, MD  warfarin (COUMADIN) 4 MG tablet Take 1 tablet (4mg ) by mouth once-daily at 6pm on Mondays, Wednesdays and Fridays. On all other days, take only 1/2 tablet. Patient taking differently: Take 2-4 mg by mouth See  admin instructions. Take 1 tablet (4mg ) by mouth daily  on Mondays, Wednesdays and Fridays. On all other days, take  1/2 tablet. 03/28/17  Yes Molt, Toma Copier, DO   Past Medical History:  Diagnosis Date  . Arthritis   . Asthma   . Bronchitis   . CHF (congestive heart failure) (HCC)   . DVT, lower extremity, recurrent (HCC)    Patient had unprovoked PE on 2002 and DVT in right lower extremety 2008.  [name] GERD (gastroesophageal reflux disease)   . History of kidney stones   . Hypertension   . PE (pulmonary embolism)    Patient had unprovoked PE on 2002  . PONV (postoperative nausea and vomiting)   . Vertigo    Social History   Socioeconomic History  . Marital status: Divorced    Spouse name: Not on file  . Number of children: 5  . Years of education: 9 th grade  . Highest education level: 9th grade  Occupational History  . Occupation: Merchandiser, retail  . Financial resource strain: Somewhat hard  . Food insecurity:    Worry: Never true    Inability: Sometimes true  . Transportation needs:    Medical: No    Non-medical: Yes  Tobacco Use  . Smoking status: Former Smoker    Types: Cigarettes    Last attempt to quit: 09/18/1988    Years since quitting: 28.9  . Smokeless tobacco: Never Used  Substance and Sexual Activity  . Alcohol use: No  . Drug use: No  . Sexual activity: Never    Birth control/protection: None  Lifestyle  . Physical activity:    Days per week: 2 days    Minutes per session: 20 min  . Stress: To some extent  Relationships  . Social connections:    Talks on phone: More than three times a week    Gets together: Once a week    Attends religious service: More than 4 times per year    Active member of club or organization: No    Attends meetings of clubs or organizations: Never    Relationship status: Divorced  Other Topics Concern  . Not on file  Social History Narrative   Current Social History 05/17/2017        Patient lives with 25 yo  granddaughter" in a/an home / condo / townhome which is 1 story/stories. There are not steps up to the entrance the patient uses.       Patient's method of transportation is church member.      The highest level of education was some high school.      The patient currently retired.      Identified important Relationships are God, family       Pets : 1 lab/pitt mix named Cinnamon       Interests / Fun: Church,TV       Current Stressors: "Me and my granddaughter"  Religious / Personal Beliefs: I'm Holiness and I love people"       Other: "I'd give away my last dime."    Family History  Problem Relation Age of Onset  . Cancer Mother   . Hypertension Sister   . Diabetes Sister   . Hypertension Brother   . Diabetes Brother   . Hypertension Sister   . Diabetes Sister   . Colon cancer Other   . Heart Problems Other 34       sister's child, open heart surgery  . Stroke Father   . Diabetes Son   . Hypertension Son   . Kidney disease Son        on dialysis  . Hypertension Sister   . Diabetes Sister   . Hypertension Sister   . Diabetes Sister   . Cancer Brother   . Hypertension Son   . Diabetes Son   . Hypertension Daughter   . Schizophrenia Daughter     ASSESSMENT Recent Results: The most recent result is correlated with 20 mg per week: Lab Results  Component Value Date   INR 2.6 08/07/2017   INR 2.2 07/10/2017   INR 2.2 06/12/2017    Anticoagulation Dosing: Description   Take one (1) of your 4mg  blue-colored warfarin tablets on Mondays, Wednesdays and Fridays. All other days, take only one-half (1/2) of your 4mg  blue-colored warfarin tablets.      INR today: Therapeutic  PLAN Weekly dose was unchanged.   Patient Instructions  Patient instructed to take medications as defined in the Anti-coagulation Track section of this encounter.  Patient instructed to take today's dose.  Patient instructed to take one (1) of your 4mg  blue-colored warfarin tablets  on Mondays, Wednesdays and Fridays. All other days, take only one-half (1/2) of your 4mg  blue-colored warfarin tablets.  Patient verbalized understanding of these instructions.     Patient advised to contact clinic or seek medical attention if signs/symptoms of bleeding or thromboembolism occur.  Patient verbalized understanding by repeating back information and was advised to contact me if further medication-related questions arise. Patient was also provided an information handout.  Follow-up Return in about 1 month (around 09/04/2017) for Follow up INR at 1000h.  Elicia Lamp,  PharmD, CACP, CPP  15 minutes spent face-to-face with the patient during the encounter. 50% of time spent on education. 50% of time was spent on fingerstick point of care INR sample collection, processing, results determination and documentation in [email or web address]

## 2017-08-08 ENCOUNTER — Encounter: Payer: Medicare Other | Admitting: Internal Medicine

## 2017-08-08 ENCOUNTER — Encounter: Payer: Medicare Other | Admitting: Dietician

## 2017-08-10 NOTE — Progress Notes (Signed)
I reviewed Dr. Saralyn Pilar note.  Patient is on coumadin for recurrent VTE.  INR was at goal.

## 2017-08-15 ENCOUNTER — Other Ambulatory Visit: Payer: Self-pay | Admitting: Internal Medicine

## 2017-08-18 NOTE — Progress Notes (Signed)
Appt sch on 09/04/2017 @ 10am

## 2017-08-25 ENCOUNTER — Other Ambulatory Visit: Payer: Self-pay | Admitting: Internal Medicine

## 2017-08-25 DIAGNOSIS — E118 Type 2 diabetes mellitus with unspecified complications: Secondary | ICD-10-CM

## 2017-08-25 DIAGNOSIS — R7309 Other abnormal glucose: Secondary | ICD-10-CM

## 2017-08-28 ENCOUNTER — Other Ambulatory Visit: Payer: Self-pay | Admitting: Oncology

## 2017-08-29 ENCOUNTER — Encounter: Payer: Self-pay | Admitting: Internal Medicine

## 2017-08-29 ENCOUNTER — Ambulatory Visit (INDEPENDENT_AMBULATORY_CARE_PROVIDER_SITE_OTHER): Payer: Medicare Other | Admitting: Internal Medicine

## 2017-08-29 ENCOUNTER — Other Ambulatory Visit: Payer: Self-pay

## 2017-08-29 ENCOUNTER — Encounter: Payer: Medicare Other | Admitting: Dietician

## 2017-08-29 VITALS — BP 144/66 | HR 74 | Temp 97.8°F | Ht 63.0 in | Wt 284.4 lb

## 2017-08-29 DIAGNOSIS — Z7901 Long term (current) use of anticoagulants: Secondary | ICD-10-CM

## 2017-08-29 DIAGNOSIS — J4521 Mild intermittent asthma with (acute) exacerbation: Secondary | ICD-10-CM

## 2017-08-29 DIAGNOSIS — Z79899 Other long term (current) drug therapy: Secondary | ICD-10-CM

## 2017-08-29 DIAGNOSIS — Z7951 Long term (current) use of inhaled steroids: Secondary | ICD-10-CM

## 2017-08-29 DIAGNOSIS — I10 Essential (primary) hypertension: Secondary | ICD-10-CM

## 2017-08-29 DIAGNOSIS — I5040 Unspecified combined systolic (congestive) and diastolic (congestive) heart failure: Secondary | ICD-10-CM | POA: Diagnosis not present

## 2017-08-29 DIAGNOSIS — Z6841 Body Mass Index (BMI) 40.0 and over, adult: Secondary | ICD-10-CM

## 2017-08-29 DIAGNOSIS — Z23 Encounter for immunization: Secondary | ICD-10-CM

## 2017-08-29 DIAGNOSIS — E782 Mixed hyperlipidemia: Secondary | ICD-10-CM

## 2017-08-29 DIAGNOSIS — Z8719 Personal history of other diseases of the digestive system: Secondary | ICD-10-CM

## 2017-08-29 DIAGNOSIS — I110 Hypertensive heart disease with heart failure: Secondary | ICD-10-CM

## 2017-08-29 DIAGNOSIS — Z7984 Long term (current) use of oral hypoglycemic drugs: Secondary | ICD-10-CM

## 2017-08-29 DIAGNOSIS — J4530 Mild persistent asthma, uncomplicated: Secondary | ICD-10-CM | POA: Insufficient documentation

## 2017-08-29 DIAGNOSIS — E785 Hyperlipidemia, unspecified: Secondary | ICD-10-CM

## 2017-08-29 DIAGNOSIS — E118 Type 2 diabetes mellitus with unspecified complications: Secondary | ICD-10-CM | POA: Diagnosis not present

## 2017-08-29 DIAGNOSIS — Z86711 Personal history of pulmonary embolism: Secondary | ICD-10-CM

## 2017-08-29 DIAGNOSIS — Z86718 Personal history of other venous thrombosis and embolism: Secondary | ICD-10-CM

## 2017-08-29 LAB — GLUCOSE, CAPILLARY: Glucose-Capillary: 108 mg/dL — ABNORMAL HIGH (ref 70–99)

## 2017-08-29 LAB — POCT GLYCOSYLATED HEMOGLOBIN (HGB A1C): HEMOGLOBIN A1C: 6.9 % — AB (ref 4.0–5.6)

## 2017-08-29 MED ORDER — MONTELUKAST SODIUM 10 MG PO TABS: 10.0000 mg | ORAL_TABLET | Freq: Every day | ORAL | 3 refills | Status: DC

## 2017-08-29 MED ORDER — BUDESONIDE-FORMOTEROL FUMARATE 160-4.5 MCG/ACT IN AERO: 2.0000 | INHALATION_SPRAY | Freq: Two times a day (BID) | RESPIRATORY_TRACT | 4 refills | Status: DC

## 2017-08-29 MED ORDER — FLUTICASONE PROPIONATE 50 MCG/ACT NA SUSP: 2.0000 | Freq: Every day | NASAL | 1 refills | Status: DC

## 2017-08-29 MED ORDER — LOSARTAN POTASSIUM 100 MG PO TABS: 100.0000 mg | ORAL_TABLET | Freq: Every day | ORAL | 1 refills | Status: DC

## 2017-08-29 MED ORDER — FAMOTIDINE 20 MG PO TABS: 20.0000 mg | ORAL_TABLET | Freq: Two times a day (BID) | ORAL | 1 refills | Status: DC

## 2017-08-29 NOTE — Assessment & Plan Note (Signed)
Assessment:  BP 144/66 with pulse 74 which improved on repeat. No complaints with Losartan 100mg  daily or Coreg and reports taking these daily.      Plan: Continue Losartan 100mg , Coreg 12.5mg  BID and prn Lasix. Emphasized importance of BP control and encouraged her to continue the hard work with dietary changes and exercise. Renal function at baseline at last check but will follow-up this today.

## 2017-08-29 NOTE — Assessment & Plan Note (Addendum)
Assessment:  A1c today 6.9! She is enjoying Metformin and denied any GI side effects that she's noticed so far. She reports working hard on her diet and exercise regimen and is optimistic about continued results.      Plan: Applauded and encouraged Caroline [name] to keep up the great work. Will continue Metformin at current dose but please increase at next visit if she continues to tolerate this well and/or if her A1c were to rise.  -Referral already placed for MNT; she cancelled her apts x2 -Foot exam performed today -Pt has apt with ophthalmology in 1 month -Continue statin, ARB

## 2017-08-29 NOTE — Patient Instructions (Addendum)
It was nice seeing you today. Thank you for choosing [facility] for your Primary Care.   Today we talked about:  1) Diabetes: You are doing a wonderful job with dietary changes and weight loss. These are some of the most important (and difficult) treatments for type 2 diabetes and I'd like you to keep up the good work. Please continue taking metformin 500mg  once daily. -Please be sure to schedule an eye exam with your eye doctor 2) High blood pressure: your blood pressure was a little elevated today at 144/66. Please be sure you are taking your Losartan and Coreg daily.  3) High cholesterol: Please make sure you are taking CRESTOR 20MG  and no longer taking Pravastatin 80mg  daily. -I'm repeating your cholesterol levels today to make sure they are improved with the stronger statin   FOLLOW-UP INSTRUCTIONS When: 75-months For: diabetes, high blood pressure What to bring: medications  Please contact the clinic if you have any problems, or need to be seen sooner.   **Please be sure to stop by the lab on your way out!

## 2017-08-29 NOTE — Assessment & Plan Note (Signed)
Assessment:  BMI 50% today. She has lost ~5lbs since her diagnosis of DM2 earlier this year which she attributes to diet and exercise.      Plan: Encouraged her to continue her hard work with weight loss and noted its effect on BP and DM2 control.

## 2017-08-29 NOTE — Progress Notes (Signed)
   CC: follow-up of type 2 diabetes, HTN,   HPI:  Ms.Jhanae FAWNE PAGEAU is a 72 y.o. F with newly-diagnosed type 2 diabetes, on chronic warfarin secondary to recurrent VTE, history of C. Diff colitis following clindamycin for treatment of cellulitis last year, combined systolic and diastolic CHF, and HTN who presents today for follow-up of type 2 diabetes. She has no complaints today.  For details regarding today's visit and the status of their chronic medical issues, please refer to the assessment and plan.  Past Medical History:  Diagnosis Date  . Arthritis   . Asthma   . Bronchitis   . CHF (congestive heart failure) (HCC)   . DVT, lower extremity, recurrent (HCC)    Patient had unprovoked PE on 2002 and DVT in right lower extremety 2008.  [name] GERD (gastroesophageal reflux disease)   . History of kidney stones   . Hypertension   . PE (pulmonary embolism)    Patient had unprovoked PE on 2002  . PONV (postoperative nausea and vomiting)   . Vertigo    Review of Systems:   General: Denies fatigue, fevers, chills. Admits to intentional weight loss (diet, exercise ~5 lbs) HEENT: Denies sore throat, headache, congestion Cardiac: Denies CP, SOB, palpitations Pulmonary: Denies cough, wheezing or PND Abd: Denies any abdominal pain, nausea, vomiting or diarrhea.  GU: Denies dysuria, increased frequency  Physical Exam: General: Alert, in no acute distress. Pleasant and conversant HEENT: No icterus, injection or ptosis. No hoarseness or dysarthria  Cardiac: RRR, no MGR. No JVD. Pulmonary: CTA BL with normal WOB on RA. Able to speak in complete sentences Abd: Soft, not distended and non-tender. +bs Extremities: Warm, perfused. No pedal edema. 2+ PT/DP pulses. No ulcerations.   Vitals:   08/29/17 1314  BP: (!) 144/66  Pulse: 74  Temp: 97.8 F (36.6 C)  TempSrc: Oral  SpO2: 99%  Weight: 284 lb 6.4 oz (129 kg)  Height: 5\' 3"  (1.6 m)   Body mass index is 50.38 kg/m.  Assessment &  Plan:   See Encounters Tab for problem based charting.  Patient discussed with Dr. Heide Spark

## 2017-08-29 NOTE — Assessment & Plan Note (Signed)
Received flu shot today in clinic. Tolerated well without immediate complication.

## 2017-08-29 NOTE — Assessment & Plan Note (Signed)
Assessment:  Her respiratory symptoms are well-controlled on Symbicort, Singulair and prn albuterol, which she has been requiring minimally lately. She has a strong seasonal allergy component and requests refill on Singulair and Flonase.      Plan: Lungs are clear. Refills sent to pharmacy.

## 2017-08-29 NOTE — Assessment & Plan Note (Signed)
Assessment:  Appreciate [facility] provider who humored my request to check a lipid panel at their last visit. LDL >100 despite reported compliance with pravastatin. She was started on Crestor 20mg  daily and denied any side effects to me today. Reports stopping Pravastatin.      Plan: Will check lipid panel today to ensure response to Crestor. If suboptimal, will increase dose or transition to Lipitor. Results pending. Encouraged continued diet and exercise. LFTs pending given recent change in medication.

## 2017-08-30 LAB — CMP14 + ANION GAP
ALBUMIN: 3.7 g/dL (ref 3.5–4.8)
ALK PHOS: 81 IU/L (ref 39–117)
ALT: 16 IU/L (ref 0–32)
ANION GAP: 14 mmol/L (ref 10.0–18.0)
AST: 13 IU/L (ref 0–40)
Albumin/Globulin Ratio: 1.2 (ref 1.2–2.2)
BILIRUBIN TOTAL: 0.4 mg/dL (ref 0.0–1.2)
BUN / CREAT RATIO: 14 (ref 12–28)
BUN: 11 mg/dL (ref 8–27)
CHLORIDE: 100 mmol/L (ref 96–106)
CO2: 29 mmol/L (ref 20–29)
Calcium: 9.7 mg/dL (ref 8.7–10.3)
Creatinine, Ser: 0.78 mg/dL (ref 0.57–1.00)
GFR calc Af Amer: 89 mL/min/{1.73_m2} (ref >59)
GFR calc non Af Amer: 77 mL/min/{1.73_m2} (ref >59)
Globulin, Total: 3.2 g/dL (ref 1.5–4.5)
Glucose: 105 mg/dL — ABNORMAL HIGH (ref 65–99)
Potassium: 3.8 mmol/L (ref 3.5–5.2)
SODIUM: 143 mmol/L (ref 134–144)
Total Protein: 6.9 g/dL (ref 6.0–8.5)

## 2017-08-30 LAB — LIPID PANEL
CHOLESTEROL TOTAL: 151 mg/dL (ref 100–199)
Chol/HDL Ratio: 3.2 ratio (ref 0.0–4.4)
HDL: 47 mg/dL (ref >39)
LDL Calculated: 82 mg/dL (ref 0–99)
Triglycerides: 112 mg/dL (ref 0–149)
VLDL Cholesterol Cal: 22 mg/dL (ref 5–40)

## 2017-08-31 ENCOUNTER — Telehealth: Payer: Self-pay | Admitting: Internal Medicine

## 2017-08-31 DIAGNOSIS — J4521 Mild intermittent asthma with (acute) exacerbation: Secondary | ICD-10-CM

## 2017-08-31 NOTE — Progress Notes (Signed)
Internal Medicine Clinic Attending  Case discussed with Dr. Molt at the time of the visit.  We reviewed the resident's history and exam and pertinent patient test results.  I agree with the assessment, diagnosis, and plan of care documented in the resident's note. 

## 2017-08-31 NOTE — Telephone Encounter (Signed)
NEEDS ADVISE ON MEDICATION USE-DH

## 2017-08-31 NOTE — Telephone Encounter (Signed)
Called pt - stated she's on pantoprazole and famotidine for her stomach. She wants to know should be taking both or famotidine which was ordered Tuesday. She had asked the pharmacist who stated sometimes people are on both. Thanks

## 2017-09-01 MED ORDER — PANTOPRAZOLE SODIUM 40 MG PO TBEC: 40.0000 mg | DELAYED_RELEASE_TABLET | Freq: Every day | ORAL | 1 refills | Status: DC

## 2017-09-01 NOTE — Telephone Encounter (Signed)
Hello, sorry for the confusion when reconciling meds! She should continue both-will send PPI Rx to pharmacy.

## 2017-09-01 NOTE — Telephone Encounter (Signed)
Called pt - instructed to take both medications per Dr Delma Officer.

## 2017-09-04 ENCOUNTER — Other Ambulatory Visit: Payer: Self-pay | Admitting: Internal Medicine

## 2017-09-04 ENCOUNTER — Ambulatory Visit (INDEPENDENT_AMBULATORY_CARE_PROVIDER_SITE_OTHER): Payer: Medicare Other | Admitting: Pharmacist

## 2017-09-04 ENCOUNTER — Other Ambulatory Visit: Payer: Medicare Other

## 2017-09-04 ENCOUNTER — Ambulatory Visit: Payer: Medicare Other

## 2017-09-04 DIAGNOSIS — Z5181 Encounter for therapeutic drug level monitoring: Secondary | ICD-10-CM

## 2017-09-04 DIAGNOSIS — Z86718 Personal history of other venous thrombosis and embolism: Secondary | ICD-10-CM

## 2017-09-04 DIAGNOSIS — R300 Dysuria: Secondary | ICD-10-CM | POA: Diagnosis not present

## 2017-09-04 DIAGNOSIS — Z7901 Long term (current) use of anticoagulants: Secondary | ICD-10-CM | POA: Diagnosis not present

## 2017-09-04 DIAGNOSIS — I82409 Acute embolism and thrombosis of unspecified deep veins of unspecified lower extremity: Secondary | ICD-10-CM

## 2017-09-04 LAB — POCT INR: INR: 2.4 (ref 2.0–3.0)

## 2017-09-04 NOTE — Progress Notes (Signed)
Anticoagulation Management Caroline [name] is a 72 y.o. female who reports to the clinic for monitoring of warfarin treatment.    Indication: DVT, History of; long term current use of anticoagulant.  Duration: indefinite Supervising physician: Blanch Media  Anticoagulation Clinic Visit History: Patient does not report signs/symptoms of bleeding or thromboembolism  Other recent changes: No diet, medications, lifestyle changes endorsed.  Anticoagulation Episode Summary    Current INR goal:   2.0-3.0  TTR:   82.5 % (2.6 y)  Next INR check:   10/02/2017  INR from last check:   2.4 (09/04/2017)  Weekly max warfarin dose:     Target end date:     INR check location:   Anticoagulation Clinic  Preferred lab:     Send INR reminders to:   [facility]   Indications   DVT lower extremity recurrent (HCC) [I82.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 Penicillins Anaphylaxis, Hives, Swelling and Rash    Has patient had a PCN reaction causing immediate rash, facial/tongue/throat swelling, SOB or lightheadedness with hypotension: Yes Has patient had a PCN reaction causing severe rash involving mucus membranes or skin necrosis: Yes Has patient had a PCN reaction that required hospitalization Yes Has patient had a PCN reaction occurring within the last 10 years: No If all of the above answers are "NO", then may proceed with Cephalosporin use.   . Cabbage Itching  . Keflex [Cephalexin] Hives    And feeling of throat tightness  . Shellfish Allergy Swelling  . Tomato Swelling  . Latex Itching and Rash   Prior to Admission medications   Medication Sig Start Date End Date Taking? Authorizing Provider  budesonide-formoterol (SYMBICORT) 160-4.5 MCG/ACT inhaler Inhale 2 puffs into the lungs 2 (two) times daily. 08/29/17  Yes Molt, Bethany, DO  calcium citrate-vitamin D (CITRACAL+D) 315-200 MG-UNIT tablet Take 2 tablets  daily by mouth. 11/14/16  Yes [name], MD  carvedilol (COREG) 12.5 MG tablet TAKE 1 TABLET (12.5 MG TOTAL) BY MOUTH 2 (TWO) TIMES DAILY. 08/03/17 11/01/17 Yes Camnitz, Will [name], MD  famotidine (PEPCID) 20 MG tablet Take 1 tablet (20 mg total) by mouth 2 (two) times daily. 08/29/17  Yes Molt, Bethany, DO  fluticasone (FLONASE) 50 MCG/ACT nasal spray Place 2 sprays into both nostrils daily. 08/29/17  Yes Molt, Bethany, DO  furosemide (LASIX) 40 MG tablet Take 1 tablet (40 mg total) by mouth every other day. 11/29/16  Yes Molt, Bethany, DO  losartan (COZAAR) 100 MG tablet Take 1 tablet (100 mg total) by mouth daily. 08/29/17  Yes Molt, Bethany, DO  Magnesium Oxide 400 MG CAPS Take 1 capsule (400 mg total) by mouth 2 (two) times daily. 01/26/17  Yes Camnitz, Will [name], MD  meclizine (ANTIVERT) 25 MG tablet TAKE 1 TABLET BY MOUTH 3 TIMES DAILY AS NEEDED 07/12/17  Yes Molt, Bethany, DO  metFORMIN (GLUCOPHAGE-XR) 500 MG 24 hr tablet TAKE 1 TABLET BY MOUTH EVERY DAY WITH BREAKFAST 08/28/17  Yes Molt, Bethany, DO  montelukast (SINGULAIR) 10 MG tablet Take 1 tablet (10 mg total) by mouth at bedtime. 08/29/17  Yes Molt, Bethany, DO  pantoprazole (PROTONIX) 40 MG tablet Take 1 tablet (40 mg total) by mouth daily. 09/01/17  Yes Molt, Bethany, DO  PROAIR HFA 108 (90 Base) MCG/ACT inhaler TAKE 2 PUFFS BY MOUTH EVERY 6 HOURS AS NEEDED FOR WHEEZE OR SHORTNESS OF  BREATH 08/16/17  Yes Molt, Bethany, DO  RESTASIS 0.05 % ophthalmic emulsion PLACE 1 DROP INTO BOTH EYES 2 (TWO) TIMES DAILY. USE ONCE DAILY 08/29/17  Yes Molt, Bethany, DO  rosuvastatin (CRESTOR) 20 MG tablet Take 1 tablet (20 mg total) by mouth daily. 06/08/17  Yes Angelita Ingles, MD  warfarin (COUMADIN) 4 MG tablet Take 1 tablet (4mg ) by mouth once-daily at 6pm on Mondays, Wednesdays and Fridays. On all other days, take only 1/2 tablet. Patient taking differently: Take 2-4 mg by mouth See admin instructions. Take 1 tablet (4mg ) by mouth daily  on Mondays,  Wednesdays and Fridays. On all other days, take  1/2 tablet. 03/28/17  Yes Molt, Toma Copier, DO   Past Medical History:  Diagnosis Date  . Arthritis   . Asthma   . Bronchitis   . CHF (congestive heart failure) (HCC)   . DVT, lower extremity, recurrent (HCC)    Patient had unprovoked PE on 2002 and DVT in right lower extremety 2008.  [name] GERD (gastroesophageal reflux disease)   . History of kidney stones   . Hypertension   . PE (pulmonary embolism)    Patient had unprovoked PE on 2002  . PONV (postoperative nausea and vomiting)   . Vertigo    Social History   Socioeconomic History  . Marital status: Divorced    Spouse name: Not on file  . Number of children: 5  . Years of education: 9 th grade  . Highest education level: 9th grade  Occupational History  . Occupation: Merchandiser, retail  . Financial resource strain: Somewhat hard  . Food insecurity:    Worry: Never true    Inability: Sometimes true  . Transportation needs:    Medical: No    Non-medical: Yes  Tobacco Use  . Smoking status: Former Smoker    Types: Cigarettes    Last attempt to quit: 09/18/1988    Years since quitting: 28.9  . Smokeless tobacco: Never Used  Substance and Sexual Activity  . Alcohol use: No  . Drug use: No  . Sexual activity: Never    Birth control/protection: None  Lifestyle  . Physical activity:    Days per week: 2 days    Minutes per session: 20 min  . Stress: To some extent  Relationships  . Social connections:    Talks on phone: More than three times a week    Gets together: Once a week    Attends religious service: More than 4 times per year    Active member of club or organization: No    Attends meetings of clubs or organizations: Never    Relationship status: Divorced  Other Topics Concern  . Not on file  Social History Narrative   Current Social History 05/17/2017        Patient lives with 60 yo granddaughter" in a/an home / condo / townhome which is 1 story/stories. There  are not steps up to the entrance the patient uses.       Patient's method of transportation is church member.      The highest level of education was some high school.      The patient currently retired.      Identified important Relationships are God, family       Pets : 1 lab/pitt mix named Cinnamon       Interests / Fun: Church,TV       Current Stressors: "Me and my granddaughter"  Religious / Personal Beliefs: I'm Holiness and I love people"       Other: "I'd give away my last dime."    Family History  Problem Relation Age of Onset  . Cancer Mother   . Hypertension Sister   . Diabetes Sister   . Hypertension Brother   . Diabetes Brother   . Hypertension Sister   . Diabetes Sister   . Colon cancer Other   . Heart Problems Other 34       sister's child, open heart surgery  . Stroke Father   . Diabetes Son   . Hypertension Son   . Kidney disease Son        on dialysis  . Hypertension Sister   . Diabetes Sister   . Hypertension Sister   . Diabetes Sister   . Cancer Brother   . Hypertension Son   . Diabetes Son   . Hypertension Daughter   . Schizophrenia Daughter     ASSESSMENT Recent Results: The most recent result is correlated with 20 mg per week: Lab Results  Component Value Date   INR 2.4 09/04/2017   INR 2.6 08/07/2017   INR 2.2 07/10/2017    Anticoagulation Dosing: Description   Take one (1) of your 4mg  blue-colored warfarin tablets on Mondays, Wednesdays and Fridays. All other days, take only one-half (1/2) of your 4mg  blue-colored warfarin tablets.      INR today: Therapeutic  PLAN Weekly dose was unchanged.   Patient Instructions  Patient instructed to take medications as defined in the Anti-coagulation Track section of this encounter.  Patient instructed to take today's dose.  Patient instructed to take one (1) of your 4mg  blue-colored warfarin tablets on Mondays, Wednesdays and Fridays. All other days, take only one-half (1/2) of  your 4mg  blue-colored warfarin tablets.  Patient verbalized understanding of these instructions.     Patient advised to contact clinic or seek medical attention if signs/symptoms of bleeding or thromboembolism occur.  Patient verbalized understanding by repeating back information and was advised to contact me if further medication-related questions arise. Patient was also provided an information handout.  Follow-up Return in about 4 weeks (around 10/02/2017) for Follow up INR at 1000h.  Elicia Lamp, PharmD, CACP, CPP  15 minutes spent face-to-face with the patient during the encounter. 50% of time spent on education. 50% of time was spent on fingerstick point of care INR sample collection, processing, results determination, and documentation in [email or web address].

## 2017-09-04 NOTE — Progress Notes (Signed)
Here to see Dr Alexandria Lodge and no [facility] appts 2 day stinging and burring on inside when urinates Mild subjective fever No recent UTI No travel No sex No vaginal bleeding or discharge No recent instrumentation   Verified ABS allergies and tele  UA with micro  [facility] [address] if requires ABX  UA, although nitrite negative, is c/w UTI. Bactrim - has never received a sulfa. Most recently got Cipro in April for presumed UTI in ED.   Will notify Dr Alexandria Lodge that she will be on Bactrim DS BID 3 days.    Will notify Dr Vincente Liberty to get F/U UA to ensure solution of hematuria.

## 2017-09-04 NOTE — Patient Instructions (Signed)
Patient instructed to take medications as defined in the Anti-coagulation Track section of this encounter.  Patient instructed to take today's dose.  Patient instructed to take one (1) of your 4mg blue-colored warfarin tablets on Mondays, Wednesdays and Fridays. All other days, take only one-half (1/2) of your 4mg blue-colored warfarin tablets.  Patient verbalized understanding of these instructions.    

## 2017-09-05 ENCOUNTER — Telehealth: Payer: Self-pay | Admitting: Pharmacist

## 2017-09-05 ENCOUNTER — Telehealth: Payer: Self-pay | Admitting: Internal Medicine

## 2017-09-05 ENCOUNTER — Other Ambulatory Visit: Payer: Self-pay | Admitting: Internal Medicine

## 2017-09-05 LAB — MICROSCOPIC EXAMINATION
Casts: NONE SEEN /lpf
WBC, UA: >30 /hpf — AB (ref 0–5)

## 2017-09-05 LAB — URINALYSIS, ROUTINE W REFLEX MICROSCOPIC
Bilirubin, UA: NEGATIVE
Glucose, UA: NEGATIVE
KETONES UA: NEGATIVE
NITRITE UA: NEGATIVE
Protein, UA: NEGATIVE
Specific Gravity, UA: 1.005 (ref 1.005–1.030)
Urobilinogen, Ur: 0.2 mg/dL (ref 0.2–1.0)
pH, UA: 7 (ref 5.0–7.5)

## 2017-09-05 MED ORDER — SULFAMETHOXAZOLE-TRIMETHOPRIM 800-160 MG PO TABS: 1.0000 | ORAL_TABLET | Freq: Two times a day (BID) | ORAL | 0 refills | Status: DC

## 2017-09-05 NOTE — Telephone Encounter (Signed)
[facility]

## 2017-09-05 NOTE — Progress Notes (Signed)
Patient notified that [facility] and to expect call from Dr Alexandria Lodge with coumadin adjustment. F/u appt sched in [facility] for 09/13/2017 at 1045. Kinnie Feil, RN, BSN

## 2017-09-05 NOTE — Progress Notes (Signed)
I will contact patient to empirically adjust her warfarin therapy DOWN due to the known DDI with SMX-TMP.

## 2017-09-05 NOTE — Telephone Encounter (Signed)
Spoke with Jill [facility] at [facility]. She wanted to make sure Provider was aware that patient takes coumadin since rx for bactrim was ordered. Informed Jill Side that provider and Dr. Alexandria Lodge aware and are making adjustments accordingly. Kinnie Feil, RN, BSN

## 2017-09-05 NOTE — Progress Notes (Unsigned)
**  Making note of patients abnormal UA with microscopic hematuria in setting of UTI. She has an appointment next week in [facility]-if her UTI syx have improved, please consider repeat UA + Microscopic evaluation to ensure hematuria has resolved.

## 2017-09-05 NOTE — Telephone Encounter (Signed)
Thanks all - I appreciate your assistance and teamwork!

## 2017-09-05 NOTE — Telephone Encounter (Signed)
Left detailed message (and will text the patient as I have text communicated with her [and she, me]) to apprise her of known DDI between SMX-TMP + warfarin. SMX-TMP is for THREE days ONLY. She is advised to OMIT her warfarin for these 3 days. She is instructed to resume her warfarin at her regularly scheduled dose--AFTER COMPLETION of her 3 day course of SMX-TMP.

## 2017-09-05 NOTE — Telephone Encounter (Signed)
I will contact the patient and make an empiric dosage adjustment to her warfarin therapy based upon her now being on SMX-TMP which will increase INR response.

## 2017-09-13 ENCOUNTER — Other Ambulatory Visit: Payer: Self-pay

## 2017-09-13 ENCOUNTER — Ambulatory Visit (INDEPENDENT_AMBULATORY_CARE_PROVIDER_SITE_OTHER): Payer: Medicare Other | Admitting: Internal Medicine

## 2017-09-13 ENCOUNTER — Encounter: Payer: Self-pay | Admitting: Internal Medicine

## 2017-09-13 VITALS — BP 142/77 | HR 71 | Temp 98.2°F | Wt 287.2 lb

## 2017-09-13 DIAGNOSIS — R3129 Other microscopic hematuria: Secondary | ICD-10-CM | POA: Diagnosis not present

## 2017-09-13 DIAGNOSIS — Z86718 Personal history of other venous thrombosis and embolism: Secondary | ICD-10-CM

## 2017-09-13 DIAGNOSIS — Z936 Other artificial openings of urinary tract status: Secondary | ICD-10-CM

## 2017-09-13 DIAGNOSIS — Z87442 Personal history of urinary calculi: Secondary | ICD-10-CM | POA: Diagnosis not present

## 2017-09-13 DIAGNOSIS — I10 Essential (primary) hypertension: Secondary | ICD-10-CM

## 2017-09-13 DIAGNOSIS — Z7901 Long term (current) use of anticoagulants: Secondary | ICD-10-CM

## 2017-09-13 DIAGNOSIS — Z87891 Personal history of nicotine dependence: Secondary | ICD-10-CM | POA: Diagnosis not present

## 2017-09-13 HISTORY — DX: Other microscopic hematuria: R31.29

## 2017-09-13 LAB — POCT URINALYSIS DIPSTICK
BILIRUBIN UA: NEGATIVE
GLUCOSE UA: NEGATIVE
KETONES UA: NEGATIVE
Nitrite, UA: NEGATIVE
PH UA: 6 (ref 5.0–8.0)
Protein, UA: NEGATIVE
Spec Grav, UA: <=1.005 — AB (ref 1.010–1.025)
Urobilinogen, UA: 0.2 E.U./dL

## 2017-09-13 NOTE — Patient Instructions (Signed)
I will call you with the results of the full urine test and let you know if we need to send you back to the urologist for further work up. It was nice seeing you again today!

## 2017-09-13 NOTE — Assessment & Plan Note (Addendum)
Patient noted to have microscopic hematuria during recent evaluation for UTI; she has completed treatment for UTI with resolution of dysuria. She has significant h/o of obstructive nephrolithiasis s/p R ureteral stenting by urology; at that time cystoscopy was negative for bladder lesions - this was in Dec 2017. She is a former smoker; denies chemical exposure and fmx of urological cancers.   Repeat urine dipstick today continues to show positive for hematuria. Will send for full urinalysis with microscopy to evaluate for actual microscopic hematuria; if present will refer back to [facility] for possible repeat cystoscopy vs CT to evaluate for urinary tract lesions; other etiology could be recurrent but asymptomatic nephrolithiasis. Patient in agreement with plan.  Addendum: Urinalysis with microscopy negative for microscopic hematuria. No further action necessary at this time. Patient made aware of results

## 2017-09-13 NOTE — Progress Notes (Signed)
   CC: hematuria  HPI:  Caroline [name] is a 72 y.o. 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  Patient had complaints of dysuria and increased urgency a couple of weeks ago while in clinic for INR visit; UA was consistent with UTI in setting of her symptoms; it did show microscopic hematuria. She was treated with Bactrim x3 days and states her symptoms have completely resolved; she denies gross hematuria, flank pain, fevers, chills, nausea, vomiting. She states that since her right ureteral stent placement in 2017 she has not had further issues with nephrolithiasis. She is a former smoker in her teens (6pack year smoking history); has not been around chemical exposures; she denies known family h/o of urological cancers.  Please see problem based Assessment and Plan for status of patients chronic conditions.  Past Medical History:  Diagnosis Date  . Arthritis   . Asthma   . Bronchitis   . CHF (congestive heart failure) (HCC)   . DVT, lower extremity, recurrent (HCC)    Patient had unprovoked PE on 2002 and DVT in right lower extremety 2008.  [name] GERD (gastroesophageal reflux disease)   . History of kidney stones   . Hypertension   . PE (pulmonary embolism)    Patient had unprovoked PE on 2002  . PONV (postoperative nausea and vomiting)   . Vertigo     Review of Systems:   Per HPI  Physical Exam:  Vitals:   09/13/17 1043  BP: (!) 152/84  Pulse: 72  Temp: 98.2 F (36.8 C)  TempSrc: Oral  SpO2: 100%  Weight: 287 lb 3.2 oz (130.3 kg)   GENERAL- alert, co-operative, appears as stated age, not in any distress. CARDIAC- RRR, no murmurs, rubs or gallops. RESP- Moving equal volumes of air, and clear to auscultation bilaterally, no wheezes or crackles. ABDOMEN- Soft, nontender, bowel sounds present. No CVA tenderness EXTREMITIES- pulse 2+, symmetric  Assessment &  Plan:   See Encounters Tab for problem based charting.   Patient discussed with Dr. Cherene Altes, MD Internal Medicine PGY-3

## 2017-09-14 ENCOUNTER — Telehealth: Payer: Self-pay | Admitting: Internal Medicine

## 2017-09-14 LAB — URINALYSIS, COMPLETE
BILIRUBIN UA: NEGATIVE
GLUCOSE, UA: NEGATIVE
Ketones, UA: NEGATIVE
Nitrite, UA: NEGATIVE
PROTEIN UA: NEGATIVE
RBC, UA: NEGATIVE
Specific Gravity, UA: <=1.005 — AB (ref 1.005–1.030)
Urobilinogen, Ur: 0.2 mg/dL (ref 0.2–1.0)
pH, UA: 6 (ref 5.0–7.5)

## 2017-09-14 LAB — MICROSCOPIC EXAMINATION: Casts: NONE SEEN /lpf

## 2017-09-16 NOTE — Telephone Encounter (Signed)
Entered by mistake

## 2017-09-18 ENCOUNTER — Emergency Department (HOSPITAL_COMMUNITY)
Admission: EM | Admit: 2017-09-18 | Discharge: 2017-09-19 | Disposition: A | Discharge status: Home / Self Care | Payer: Medicare Other | Attending: Emergency Medicine | Admitting: Emergency Medicine

## 2017-09-18 ENCOUNTER — Encounter (HOSPITAL_COMMUNITY): Payer: Self-pay | Admitting: Radiology

## 2017-09-18 ENCOUNTER — Emergency Department (HOSPITAL_COMMUNITY): Payer: Medicare Other

## 2017-09-18 ENCOUNTER — Other Ambulatory Visit: Payer: Self-pay

## 2017-09-18 DIAGNOSIS — K5732 Diverticulitis of large intestine without perforation or abscess without bleeding: Secondary | ICD-10-CM | POA: Insufficient documentation

## 2017-09-18 DIAGNOSIS — Z91040 Latex allergy status: Secondary | ICD-10-CM | POA: Insufficient documentation

## 2017-09-18 DIAGNOSIS — K5792 Diverticulitis of intestine, part unspecified, without perforation or abscess without bleeding: Secondary | ICD-10-CM | POA: Diagnosis not present

## 2017-09-18 DIAGNOSIS — Z7901 Long term (current) use of anticoagulants: Secondary | ICD-10-CM | POA: Insufficient documentation

## 2017-09-18 DIAGNOSIS — I110 Hypertensive heart disease with heart failure: Secondary | ICD-10-CM | POA: Insufficient documentation

## 2017-09-18 DIAGNOSIS — Z79899 Other long term (current) drug therapy: Secondary | ICD-10-CM | POA: Diagnosis not present

## 2017-09-18 DIAGNOSIS — K5790 Diverticulosis of intestine, part unspecified, without perforation or abscess without bleeding: Secondary | ICD-10-CM | POA: Diagnosis not present

## 2017-09-18 DIAGNOSIS — Z87891 Personal history of nicotine dependence: Secondary | ICD-10-CM | POA: Insufficient documentation

## 2017-09-18 DIAGNOSIS — I5042 Chronic combined systolic (congestive) and diastolic (congestive) heart failure: Secondary | ICD-10-CM | POA: Diagnosis not present

## 2017-09-18 DIAGNOSIS — J45909 Unspecified asthma, uncomplicated: Secondary | ICD-10-CM | POA: Diagnosis not present

## 2017-09-18 DIAGNOSIS — R1032 Left lower quadrant pain: Secondary | ICD-10-CM | POA: Diagnosis present

## 2017-09-18 LAB — CBC WITH DIFFERENTIAL/PLATELET
Abs Immature Granulocytes: 0 10*3/uL (ref 0.0–0.1)
BASOS ABS: 0 10*3/uL (ref 0.0–0.1)
Basophils Relative: 0 %
Eosinophils Absolute: 0.1 10*3/uL (ref 0.0–0.7)
Eosinophils Relative: 1 %
HCT: 36.2 % (ref 36.0–46.0)
Hemoglobin: 10.6 g/dL — ABNORMAL LOW (ref 12.0–15.0)
IMMATURE GRANULOCYTES: 0 %
Lymphocytes Relative: 18 %
Lymphs Abs: 2.1 10*3/uL (ref 0.7–4.0)
MCH: 24.2 pg — ABNORMAL LOW (ref 26.0–34.0)
MCHC: 29.3 g/dL — AB (ref 30.0–36.0)
MCV: 82.6 fL (ref 78.0–100.0)
Monocytes Absolute: 1 10*3/uL (ref 0.1–1.0)
Monocytes Relative: 9 %
NEUTROS PCT: 72 %
Neutro Abs: 8.7 10*3/uL — ABNORMAL HIGH (ref 1.7–7.7)
Platelets: 230 10*3/uL (ref 150–400)
RBC: 4.38 MIL/uL (ref 3.87–5.11)
RDW: 15.9 % — ABNORMAL HIGH (ref 11.5–15.5)
WBC: 12.1 10*3/uL — ABNORMAL HIGH (ref 4.0–10.5)

## 2017-09-18 LAB — URINALYSIS, ROUTINE W REFLEX MICROSCOPIC
BILIRUBIN URINE: NEGATIVE
Glucose, UA: NEGATIVE mg/dL
Hgb urine dipstick: NEGATIVE
Ketones, ur: NEGATIVE mg/dL
LEUKOCYTES UA: NEGATIVE
NITRITE: NEGATIVE
Protein, ur: NEGATIVE mg/dL
Specific Gravity, Urine: 1.003 — ABNORMAL LOW (ref 1.005–1.030)
pH: 7 (ref 5.0–8.0)

## 2017-09-18 LAB — COMPREHENSIVE METABOLIC PANEL
ALK PHOS: 69 U/L (ref 38–126)
ALT: 17 U/L (ref 0–44)
AST: 16 U/L (ref 15–41)
Albumin: 3.1 g/dL — ABNORMAL LOW (ref 3.5–5.0)
Anion gap: 7 (ref 5–15)
BUN: 9 mg/dL (ref 8–23)
CO2: 31 mmol/L (ref 22–32)
CREATININE: 0.88 mg/dL (ref 0.44–1.00)
Calcium: 9.3 mg/dL (ref 8.9–10.3)
Chloride: 105 mmol/L (ref 98–111)
GFR calc non Af Amer: >60 mL/min (ref >60)
Glucose, Bld: 147 mg/dL — ABNORMAL HIGH (ref 70–99)
Potassium: 3.9 mmol/L (ref 3.5–5.1)
Sodium: 143 mmol/L (ref 135–145)
TOTAL PROTEIN: 7.2 g/dL (ref 6.5–8.1)
Total Bilirubin: 0.6 mg/dL (ref 0.3–1.2)

## 2017-09-18 MED ORDER — METRONIDAZOLE 500 MG PO TABS
500.0000 mg | ORAL_TABLET | Freq: Once | ORAL | Status: AC
  Administered 2017-09-18: 500 mg via ORAL
  Filled 2017-09-18: qty 1

## 2017-09-18 MED ORDER — IOPAMIDOL (ISOVUE-300) INJECTION 61%
INTRAVENOUS | Status: AC
  Filled 2017-09-18: qty 100

## 2017-09-18 MED ORDER — MECLIZINE HCL 25 MG PO TABS
25.0000 mg | ORAL_TABLET | Freq: Once | ORAL | Status: DC
  Filled 2017-09-18: qty 1

## 2017-09-18 MED ORDER — SULFAMETHOXAZOLE-TRIMETHOPRIM 800-160 MG PO TABS: 1.0000 | ORAL_TABLET | Freq: Two times a day (BID) | ORAL | 0 refills | Status: AC

## 2017-09-18 MED ORDER — IOPAMIDOL (ISOVUE-300) INJECTION 61%
100.0000 mL | Freq: Once | INTRAVENOUS | Status: AC | PRN
  Administered 2017-09-18: 100 mL via INTRAVENOUS

## 2017-09-18 MED ORDER — METRONIDAZOLE 500 MG PO TABS: 500.0000 mg | ORAL_TABLET | Freq: Three times a day (TID) | ORAL | 0 refills | Status: AC

## 2017-09-18 MED ORDER — SULFAMETHOXAZOLE-TRIMETHOPRIM 800-160 MG PO TABS
1.0000 | ORAL_TABLET | Freq: Once | ORAL | Status: AC
  Administered 2017-09-18: 1 via ORAL
  Filled 2017-09-18: qty 1

## 2017-09-18 MED ORDER — DIPHENHYDRAMINE HCL 25 MG PO CAPS
25.0000 mg | ORAL_CAPSULE | Freq: Once | ORAL | Status: AC
  Administered 2017-09-18: 25 mg via ORAL
  Filled 2017-09-18: qty 1

## 2017-09-18 NOTE — ED Notes (Signed)
ED Provider at bedside. 

## 2017-09-18 NOTE — ED Notes (Signed)
Pt reports that she is unable to tolerate laying flat d/t vertigo, states she wants to be put to sleep for scan. Dr. Adela Lank notified.

## 2017-09-18 NOTE — ED Triage Notes (Addendum)
Pt reports left sided pain lower quadrant radiating around to abdomen. She took pepcid and felt better. She has been taking antibiotics for UTI on 4th weeks ago. Went for recheck and was clear.Denies nausea, vomiting, diarr. Hx of kidney stones.

## 2017-09-18 NOTE — Progress Notes (Signed)
Internal Medicine Clinic Attending  Case discussed with Dr. Svalina  at the time of the visit.  We reviewed the resident's history and exam and pertinent patient test results.  I agree with the assessment, diagnosis, and plan of care documented in the resident's note.  

## 2017-09-18 NOTE — ED Provider Notes (Signed)
[facility] EMERGENCY DEPARTMENT Provider Note   CSN: [id number] Arrival date & time: 09/18/17  1048     History   Chief Complaint Chief Complaint  Patient presents with  . Pelvic Pain    HPI Caroline [name] is a 72 y.o. female.  The history is provided by the patient.  Abdominal Pain   This is a new problem. The current episode started 12 to 24 hours ago. The problem occurs constantly. The problem has been resolved. The pain is located in the LLQ. The quality of the pain is cramping. The pain is at a severity of 0/10. Pertinent negatives include fever, diarrhea, vomiting, constipation, dysuria, hematuria and arthralgias. Nothing aggravates the symptoms. Nothing relieves the symptoms.    Past Medical History:  Diagnosis Date  . Arthritis   . Asthma   . Bronchitis   . CHF (congestive heart failure) (HCC)   . DVT, lower extremity, recurrent (HCC)    Patient had unprovoked PE on 2002 and DVT in right lower extremety 2008.  [name] GERD (gastroesophageal reflux disease)   . History of kidney stones   . Hypertension   . PE (pulmonary embolism)    Patient had unprovoked PE on 2002  . PONV (postoperative nausea and vomiting)   . Vertigo     Patient Active Problem List   Diagnosis Date Noted  . Hematuria, microscopic 09/13/2017  . Need for immunization against influenza 08/29/2017  . Asthma, chronic, mild persistent, uncomplicated 08/29/2017  . Type 2 diabetes mellitus with complication, without long-term current use of insulin (HCC) 06/02/2017  . Hypokalemia 05/01/2017  . History of Clostridium difficile colitis 12/31/2016  . Anticoagulated on warfarin   . Orthostatic hypotension   . Lipodermatosclerosis 10/11/2016  . Morbid obesity with BMI of 50.0-59.9, adult (HCC) 05/02/2016  . Chronic combined systolic and diastolic CHF, NYHA class 1 (HCC) 09/21/2015  . DVT, lower extremity, recurrent (HCC)   . Vertigo 09/13/2011  . Allergic rhinitis 06/21/2006  .  Hyperlipidemia 06/10/2006  . Essential hypertension 06/10/2006    Past Surgical History:  Procedure Laterality Date  . ABDOMINAL HYSTERECTOMY     1980  . CYSTOSCOPY W/ URETERAL STENT PLACEMENT Right 09/16/2015   Procedure: CYSTOSCOPY WITH RETROGRADE PYELOGRAM/URETERAL STENT PLACEMENT;  Surgeon: Alfredo Martinez, MD;  Location: [facility] OR;  Service: Urology;  Laterality: Right;  . CYSTOSCOPY WITH RETROGRADE PYELOGRAM, URETEROSCOPY AND STENT PLACEMENT Right 12/11/2015   Procedure: RIGHT URETEROSCOPY/HOLMIUM LASER LITHOTRIPSY AND STONE REMOVAL removal and placement of double j stent;  Surgeon: Crist Fat, MD;  Location: [facility] ORS;  Service: Urology;  Laterality: Right;  . HOLMIUM LASER APPLICATION Right 12/11/2015   Procedure: HOLMIUM LASER APPLICATION;  Surgeon: Crist Fat, MD;  Location: [facility] ORS;  Service: Urology;  Laterality: Right;     OB History   None      Home Medications    Prior to Admission medications   Medication Sig Start Date End Date Taking? Authorizing Provider  budesonide-formoterol (SYMBICORT) 160-4.5 MCG/ACT inhaler Inhale 2 puffs into the lungs 2 (two) times daily. 08/29/17   Molt, Bethany, DO  calcium citrate-vitamin D (CITRACAL+D) 315-200 MG-UNIT tablet Take 2 tablets daily by mouth. 11/14/16   [name], MD  carvedilol (COREG) 12.5 MG tablet TAKE 1 TABLET (12.5 MG TOTAL) BY MOUTH 2 (TWO) TIMES DAILY. 08/03/17 11/01/17  Camnitz, Andree Coss, MD  famotidine (PEPCID) 20 MG tablet Take 1 tablet (20 mg total) by mouth 2 (two) times daily. 08/29/17   Molt, Toma Copier,  DO  fluticasone (FLONASE) 50 MCG/ACT nasal spray Place 2 sprays into both nostrils daily. 08/29/17   Molt, Bethany, DO  furosemide (LASIX) 40 MG tablet Take 1 tablet (40 mg total) by mouth every other day. 11/29/16   Molt, Bethany, DO  losartan (COZAAR) 100 MG tablet Take 1 tablet (100 mg total) by mouth daily. 08/29/17   Molt, Bethany, DO  Magnesium Oxide 400 MG CAPS Take 1 capsule (400 mg total) by  mouth 2 (two) times daily. 01/26/17   Camnitz, Andree Coss, MD  meclizine (ANTIVERT) 25 MG tablet TAKE 1 TABLET BY MOUTH 3 TIMES DAILY AS NEEDED 07/12/17   Molt, Bethany, DO  metFORMIN (GLUCOPHAGE-XR) 500 MG 24 hr tablet TAKE 1 TABLET BY MOUTH EVERY DAY WITH BREAKFAST 08/28/17   Molt, Bethany, DO  metroNIDAZOLE (FLAGYL) 500 MG tablet Take 1 tablet (500 mg total) by mouth 3 (three) times daily for 7 days. 09/18/17 09/25/17  Nash Dimmer, MD  montelukast (SINGULAIR) 10 MG tablet Take 1 tablet (10 mg total) by mouth at bedtime. 08/29/17   Molt, Bethany, DO  pantoprazole (PROTONIX) 40 MG tablet Take 1 tablet (40 mg total) by mouth daily. 09/01/17   Molt, Bethany, DO  PROAIR HFA 108 (90 Base) MCG/ACT inhaler TAKE 2 PUFFS BY MOUTH EVERY 6 HOURS AS NEEDED FOR WHEEZE OR SHORTNESS OF BREATH 08/16/17   Molt, Bethany, DO  RESTASIS 0.05 % ophthalmic emulsion PLACE 1 DROP INTO BOTH EYES 2 (TWO) TIMES DAILY. USE ONCE DAILY 08/29/17   Molt, Bethany, DO  rosuvastatin (CRESTOR) 20 MG tablet Take 1 tablet (20 mg total) by mouth daily. 06/08/17   Angelita Ingles, MD  sulfamethoxazole-trimethoprim (BACTRIM DS,SEPTRA DS) 800-160 MG tablet Take 1 tablet by mouth 2 (two) times daily for 7 days. 09/18/17 09/25/17  Nash Dimmer, MD  warfarin (COUMADIN) 4 MG tablet Take 1 tablet (4mg ) by mouth once-daily at 6pm on Mondays, Wednesdays and Fridays. On all other days, take only 1/2 tablet. Patient taking differently: Take 2-4 mg by mouth See admin instructions. Take 1 tablet (4mg ) by mouth daily  on Mondays, Wednesdays and Fridays. On all other days, take  1/2 tablet. 03/28/17   Molt, Bethany, DO    Family History Family History  Problem Relation Age of Onset  . Cancer Mother   . Hypertension Sister   . Diabetes Sister   . Hypertension Brother   . Diabetes Brother   . Hypertension Sister   . Diabetes Sister   . Colon cancer Other   . Heart Problems Other 34       sister's child, open heart surgery  . Stroke Father   .  Diabetes Son   . Hypertension Son   . Kidney disease Son        on dialysis  . Hypertension Sister   . Diabetes Sister   . Hypertension Sister   . Diabetes Sister   . Cancer Brother   . Hypertension Son   . Diabetes Son   . Hypertension Daughter   . Schizophrenia Daughter     Social History Social History   Tobacco Use  . Smoking status: Former Smoker    Types: Cigarettes    Last attempt to quit: 09/18/1988    Years since quitting: 29.0  . Smokeless tobacco: Never Used  . Tobacco comment: 6 pack year smoking history as a teen  Substance Use Topics  . Alcohol use: No  . Drug use: No     Allergies   Penicillins; Cabbage; Keflex [  cephalexin]; Shellfish allergy; Tomato; and Latex   Review of Systems Review of Systems  Constitutional: Negative for chills and fever.  HENT: Negative for ear pain and sore throat.   Eyes: Negative for pain and visual disturbance.  Respiratory: Negative for cough.   Cardiovascular: Negative for palpitations.  Gastrointestinal: Positive for abdominal pain. Negative for abdominal distention, blood in stool, constipation, diarrhea and vomiting.  Genitourinary: Negative for dysuria and hematuria.  Musculoskeletal: Negative for arthralgias and back pain.  Skin: Negative for color change and rash.  Neurological: Negative for seizures and syncope.  All other systems reviewed and are negative.    Physical Exam Updated Vital Signs BP (!) 126/54 (BP Location: Right Arm)   Pulse 77   Temp 98.1 F (36.7 C) (Oral)   Resp 18   SpO2 98%   Physical Exam  Constitutional: She appears well-developed and well-nourished. No distress.  HENT:  Head: Normocephalic and atraumatic.  Eyes: Conjunctivae are normal.  Neck: Neck supple.  Cardiovascular: Normal rate and regular rhythm.  No murmur heard. Pulmonary/Chest: Effort normal and breath sounds normal. No respiratory distress.  Abdominal: Soft. There is no tenderness.  Musculoskeletal: She exhibits  no edema.  Neurological: She is alert.  Skin: Skin is warm and dry.  Psychiatric: She has a normal mood and affect.  Nursing note and vitals reviewed.    ED Treatments / Results  Labs (all labs ordered are listed, but only abnormal results are displayed) Labs Reviewed  COMPREHENSIVE METABOLIC PANEL - Abnormal; Notable for the following components:      Result Value   Glucose, Bld 147 (*)    Albumin 3.1 (*)    All other components within normal limits  CBC WITH DIFFERENTIAL/PLATELET - Abnormal; Notable for the following components:   WBC 12.1 (*)    Hemoglobin 10.6 (*)    MCH 24.2 (*)    MCHC 29.3 (*)    RDW 15.9 (*)    Neutro Abs 8.7 (*)    All other components within normal limits  URINALYSIS, ROUTINE W REFLEX MICROSCOPIC - Abnormal; Notable for the following components:   Color, Urine STRAW (*)    Specific Gravity, Urine 1.003 (*)    All other components within normal limits    EKG None  Radiology Ct Abdomen Pelvis W Contrast  Result Date: 09/18/2017 CLINICAL DATA:  Left lower quadrant pain. Nausea. Diverticulitis suspected. EXAM: CT ABDOMEN AND PELVIS WITH CONTRAST TECHNIQUE: Multidetector CT imaging of the abdomen and pelvis was performed using the standard protocol following bolus administration of intravenous contrast. CONTRAST:  ISOVUE-300 IOPAMIDOL (ISOVUE-300) INJECTION 61% COMPARISON:  CT 09/16/2015 FINDINGS: Lower chest: Linear opacities in both lung bases either atelectasis or scarring. No acute airspace disease. No pleural fluid. Hepatobiliary: Enlarged liver spanning 21 cm cranial caudal. No focal hepatic lesion. Gallbladder physiologically distended, no calcified stone. No biliary dilatation. Pancreas: No ductal dilatation or inflammation. Spleen: Normal in size without focal abnormality. Adrenals/Urinary Tract: Normal adrenal glands. No hydronephrosis or perinephric edema. Homogeneous renal enhancement with symmetric excretion on delayed phase imaging. Simple  cyst in the upper left kidney. Urinary bladder is physiologically distended without wall thickening. Stomach/Bowel: Colonic diverticulosis with probable inflamed diverticulum at the junction of the descending and sigmoid colon. Mild associated colonic wall thickening and pericolonic edema. There is a 9 mm adjacent pericolonic node. No free air or abscess. Normal appendix. No other bowel inflammation. No bowel obstruction. Small hiatal hernia. Vascular/Lymphatic: Normal caliber abdominal aorta with mild atherosclerosis. Other than pericolonic  node, no enlarged abdominal or pelvic lymph nodes. Reproductive: Post hysterectomy. Left ovary tentatively identified and normal. Right ovary not definitely seen. No adnexal mass. Other: No free air or abscess.  No free fluid. Musculoskeletal: Multilevel degenerative change in the lumbar spine. There are no acute or suspicious osseous abnormalities. IMPRESSION: 1. Findings suspicious for acute diverticulitis at the junction of the descending and sigmoid colon. There is an adjacent prominent pericolonic node, that may be reactive but is nonspecific. Recommend direct visualization with colonoscopy to exclude underlying neoplasm after course of treatment. 2. Incidental note of hepatomegaly. Electronically Signed   By: Narda Rutherford M.D.   On: 09/18/2017 22:57    Procedures Procedures (including critical care time)  Medications Ordered in ED Medications  diphenhydrAMINE (BENADRYL) capsule 25 mg (25 mg Oral Given 09/18/17 2142)  iopamidol (ISOVUE-300) 61 % injection 100 mL (100 mLs Intravenous Contrast Given 09/18/17 2215)  sulfamethoxazole-trimethoprim (BACTRIM DS,SEPTRA DS) 800-160 MG per tablet 1 tablet (1 tablet Oral Given 09/18/17 2340)  metroNIDAZOLE (FLAGYL) tablet 500 mg (500 mg Oral Given 09/18/17 2340)     Initial Impression / Assessment and Plan / ED Course  I have reviewed the triage vital signs and the nursing notes.  Pertinent labs & imaging results that  were available during my care of the patient were reviewed by me and considered in my medical decision making (see chart for details).     Is a 72 year old female with past medical history of A. fib who presents with left lower quadrant abdominal pain that began yesterday and resolved this morning.  The patient reports that nothing seemed to make the pain is worse, she denies nausea, vomiting, diarrhea.  She denies dysuria and reports that she was recently treated for urinary tract infection.  She reports that her urination is been much better since that time.  Physical exam reveals no abdominal tenderness.  Labs significant for leukocytosis, so CT abdomen pelvis ordered which revealed diverticulitis.  Patient given Flagyl and Bactrim since she has a penicillin allergy for diverticulitis.  She is given prescriptions for the same.  Patient is given return precautions and instructed to follow-up with her primary care physician and patient reports understanding and agreement with the discharge plan.  Patient care supervised by Dr. Adela Lank.  Nash Dimmer, MD  Final Clinical Impressions(s) / ED Diagnoses   Final diagnoses:  Diverticulitis    ED Discharge Orders         Ordered    metroNIDAZOLE (FLAGYL) 500 MG tablet  3 times daily     09/18/17 2317    sulfamethoxazole-trimethoprim (BACTRIM DS,SEPTRA DS) 800-160 MG tablet  2 times daily     09/18/17 2317           Nash Dimmer, MD 09/19/17 1455    Melene Plan, DO 09/19/17 1636

## 2017-09-18 NOTE — ED Notes (Signed)
Patient transported to CT 

## 2017-09-19 ENCOUNTER — Telehealth: Payer: Self-pay | Admitting: Pharmacist

## 2017-09-19 NOTE — Telephone Encounter (Signed)
Called by patient to my cell phone as time-stamped above. Called stating was seen in ED with diagnosis of diverticulitis for which she was put on metronidazole and SMX-TMP for 7 days (confirmed in EHR). Will have to empirically adjust the warfarin in setting of two known DDI's (warfarin + metronidazole and warfarin + SMX-TMP) which will cause a marked hypoprothrombinemic response and increase potential for bleeding. Have called patient and advised the following:  OMIT today's dose (Tuesday, September 19, 2017); Take 1/2 of your 4mg  blue-colored warfarin dose on Wednesday, September 20, 2017; OMIT dose on Thursday, September 21, 2017; Take Take 1/2 of your 4mg  blue-colored warfarin dose on Friday, September 22, 2017; OMIT dose on Saturday, September 23, 2017; Take 1/2 of your 4mg  blue-colored warfarin dose on Sunday, September 24, 2017. Come to [facility] on Monday September 25, 2017 for INR determination at 0915h. Between now and scheduled appointment, should there be an blood in urine, stool, nose-bleeds, throwing up blood, coughing up blood, any change in bowel pain or tenderness, any increased bruising--return to the Emergency Department. She verbalized these instructions back to me.

## 2017-09-25 ENCOUNTER — Ambulatory Visit (INDEPENDENT_AMBULATORY_CARE_PROVIDER_SITE_OTHER): Payer: Medicare Other | Admitting: Pharmacist

## 2017-09-25 DIAGNOSIS — Z5181 Encounter for therapeutic drug level monitoring: Secondary | ICD-10-CM

## 2017-09-25 DIAGNOSIS — Z86718 Personal history of other venous thrombosis and embolism: Secondary | ICD-10-CM

## 2017-09-25 DIAGNOSIS — Z7901 Long term (current) use of anticoagulants: Secondary | ICD-10-CM | POA: Diagnosis not present

## 2017-09-25 DIAGNOSIS — I82409 Acute embolism and thrombosis of unspecified deep veins of unspecified lower extremity: Secondary | ICD-10-CM | POA: Diagnosis not present

## 2017-09-25 LAB — POCT INR: INR: 1.9 — AB (ref 2.0–3.0)

## 2017-09-25 NOTE — Patient Instructions (Signed)
Patient instructed to take medications as defined in the Anti-coagulation Track section of this encounter.  Patient instructed to take today's dose.  Patient instructed to  one (1) of your 4mg  blue-colored warfarin tablets on Wednesdays and Fridays. All other days, take only one-half (1/2) of your 4mg  blue-colored warfarin tablets. Patient verbalized understanding of these instructions.

## 2017-09-25 NOTE — Progress Notes (Signed)
Anticoagulation Management Caroline [name] is a 72 y.o. female who reports to the clinic for monitoring of warfarin treatment.    Indication: DVT, lower extremity (history of); PE (history of); Long term current use of anticoagulants.   Duration: indefinite Supervising physician: Earl Lagos  Anticoagulation Clinic Visit History: Patient does not report signs/symptoms of bleeding or thromboembolism  Other recent changes: No diet, medications, lifestyle changes endorsed other than as noted in patient findings section.  Anticoagulation Episode Summary    Current INR goal:   2.0-3.0  TTR:   82.4 % (2.6 y)  Next INR check:   10/02/2017  INR from last check:   1.9! 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 Penicillins Anaphylaxis, Hives, Swelling and Rash    Has patient had a PCN reaction causing immediate rash, facial/tongue/throat swelling, SOB or lightheadedness with hypotension: Yes Has patient had a PCN reaction causing severe rash involving mucus membranes or skin necrosis: Yes Has patient had a PCN reaction that required hospitalization Yes Has patient had a PCN reaction occurring within the last 10 years: No If all of the above answers are "NO", then may proceed with Cephalosporin use.   . Cabbage Itching  . Keflex [Cephalexin] Hives    And feeling of throat tightness  . Shellfish Allergy Swelling  . Tomato Swelling  . Latex Itching and Rash   Prior to Admission medications   Medication Sig Start Date End Date Taking? Authorizing Provider  budesonide-formoterol (SYMBICORT) 160-4.5 MCG/ACT inhaler Inhale 2 puffs into the lungs 2 (two) times daily. 08/29/17  Yes Molt,  Bethany, DO  calcium citrate-vitamin D (CITRACAL+D) 315-200 MG-UNIT tablet Take 2 tablets daily by mouth. 11/14/16  Yes [name], MD  carvedilol (COREG) 12.5 MG tablet TAKE 1 TABLET (12.5 MG TOTAL) BY MOUTH 2 (TWO) TIMES DAILY. 08/03/17 11/01/17 Yes Camnitz, Will [name], MD  famotidine (PEPCID) 20 MG tablet Take 1 tablet (20 mg total) by mouth 2 (two) times daily. 08/29/17  Yes Molt, Bethany, DO  fluticasone (FLONASE) 50 MCG/ACT nasal spray Place 2 sprays into both nostrils daily. 08/29/17  Yes Molt, Bethany, DO  furosemide (LASIX) 40 MG tablet Take 1 tablet (40 mg total) by mouth every other day. 11/29/16  Yes Molt, Bethany, DO  losartan (COZAAR) 100 MG tablet Take 1 tablet (100 mg total) by mouth daily. 08/29/17  Yes Molt, Bethany, DO  Magnesium Oxide 400 MG CAPS Take 1 capsule (400 mg total) by mouth 2 (two) times daily. 01/26/17  Yes Camnitz, Will [name], MD  meclizine (ANTIVERT) 25 MG tablet TAKE 1 TABLET BY MOUTH 3 TIMES DAILY AS NEEDED 07/12/17  Yes Molt, Bethany, DO  metFORMIN (GLUCOPHAGE-XR) 500 MG 24 hr tablet TAKE 1 TABLET BY MOUTH EVERY DAY WITH BREAKFAST 08/28/17  Yes Molt, Bethany, DO  metroNIDAZOLE (FLAGYL) 500 MG tablet Take 1 tablet (500 mg total) by mouth 3 (three) times daily for 7 days. 09/18/17 09/25/17 Yes Nash Dimmer, MD  montelukast (SINGULAIR) 10 MG tablet Take 1 tablet (10 mg total) by mouth at bedtime. 08/29/17  Yes Molt, Bethany, DO  pantoprazole (PROTONIX)  40 MG tablet Take 1 tablet (40 mg total) by mouth daily. 09/01/17  Yes Molt, Bethany, DO  PROAIR HFA 108 (90 Base) MCG/ACT inhaler TAKE 2 PUFFS BY MOUTH EVERY 6 HOURS AS NEEDED FOR WHEEZE OR SHORTNESS OF BREATH 08/16/17  Yes Molt, Bethany, DO  RESTASIS 0.05 % ophthalmic emulsion PLACE 1 DROP INTO BOTH EYES 2 (TWO) TIMES DAILY. USE ONCE DAILY 08/29/17  Yes Molt, Bethany, DO  rosuvastatin (CRESTOR) 20 MG tablet Take 1 tablet (20 mg total) by mouth daily. 06/08/17  Yes Angelita Ingles, MD  sulfamethoxazole-trimethoprim  (BACTRIM DS,SEPTRA DS) 800-160 MG tablet Take 1 tablet by mouth 2 (two) times daily for 7 days. 09/18/17 09/25/17 Yes Nash Dimmer, MD  warfarin (COUMADIN) 4 MG tablet Take 1 tablet (4mg ) by mouth once-daily at 6pm on Mondays, Wednesdays and Fridays. On all other days, take only 1/2 tablet. Patient taking differently: Take 2-4 mg by mouth See admin instructions. Take 1 tablet (4mg ) by mouth daily  on Mondays, Wednesdays and Fridays. On all other days, take  1/2 tablet. 03/28/17  Yes Molt, Toma Copier, DO   Past Medical History:  Diagnosis Date  . Arthritis   . Asthma   . Bronchitis   . CHF (congestive heart failure) (HCC)   . DVT, lower extremity, recurrent (HCC)    Patient had unprovoked PE on 2002 and DVT in right lower extremety 2008.  [name] GERD (gastroesophageal reflux disease)   . History of kidney stones   . Hypertension   . PE (pulmonary embolism)    Patient had unprovoked PE on 2002  . PONV (postoperative nausea and vomiting)   . Vertigo    Social History   Socioeconomic History  . Marital status: Divorced    Spouse name: Not on file  . Number of children: 5  . Years of education: 9 th grade  . Highest education level: 9th grade  Occupational History  . Occupation: Merchandiser, retail  . Financial resource strain: Somewhat hard  . Food insecurity:    Worry: Never true    Inability: Sometimes true  . Transportation needs:    Medical: No    Non-medical: Yes  Tobacco Use  . Smoking status: Former Smoker    Types: Cigarettes    Last attempt to quit: 09/18/1988    Years since quitting: 29.0  . Smokeless tobacco: Never Used  . Tobacco comment: 6 pack year smoking history as a teen  Substance and Sexual Activity  . Alcohol use: No  . Drug use: No  . Sexual activity: Never    Birth control/protection: None  Lifestyle  . Physical activity:    Days per week: 2 days    Minutes per session: 20 min  . Stress: To some extent  Relationships  . Social connections:    Talks on  phone: More than three times a week    Gets together: Once a week    Attends religious service: More than 4 times per year    Active member of club or organization: No    Attends meetings of clubs or organizations: Never    Relationship status: Divorced  Other Topics Concern  . Not on file  Social History Narrative   Current Social History 05/17/2017        Patient lives with 60 yo granddaughter" in a/an home / condo / townhome which is 1 story/stories. There are not steps up to the entrance the patient uses.       Patient's method  of transportation is church member.      The highest level of education was some high school.      The patient currently retired.      Identified important Relationships are God, family       Pets : 1 lab/pitt mix named Cinnamon       Interests / Fun: Church,TV       Current Stressors: "Me and my granddaughter"       Religious / Personal Beliefs: I'm Holiness and I love people"       Other: "I'd give away my last dime."    Family History  Problem Relation Age of Onset  . Cancer Mother   . Hypertension Sister   . Diabetes Sister   . Hypertension Brother   . Diabetes Brother   . Hypertension Sister   . Diabetes Sister   . Colon cancer Other   . Heart Problems Other 34       sister's child, open heart surgery  . Stroke Father   . Diabetes Son   . Hypertension Son   . Kidney disease Son        on dialysis  . Hypertension Sister   . Diabetes Sister   . Hypertension Sister   . Diabetes Sister   . Cancer Brother   . Hypertension Son   . Diabetes Son   . Hypertension Daughter   . Schizophrenia Daughter     ASSESSMENT Recent Results: The most recent result is correlated with OMITTING a dose of warfarin every other day---alternating with 1/2 of the regularly scheduled dose on the days she was to take warfarin--all empirically done after patient contacted me last week after her ED visit in which she was placed upon SMX-TMP and metronidazole  which she completes today for diverticulitis diagnosed in ED last week.  Lab Results  Component Value Date   INR 1.9 (A) 09/25/2017   INR 2.4 09/04/2017   INR 2.6 08/07/2017    Anticoagulation Dosing: Description   Take one (1) of your 4mg  blue-colored warfarin tablets on Wednesdays and Fridays. All other days, take only one-half (1/2) of your 4mg  blue-colored warfarin tablets.      INR today: Subtherapeutic  PLAN Weekly dose was increased back to her usual regimen of 18mg  per week.   Patient Instructions  Patient instructed to take medications as defined in the Anti-coagulation Track section of this encounter.  Patient instructed to take today's dose.  Patient instructed to  one (1) of your 4mg  blue-colored warfarin tablets on Wednesdays and Fridays. All other days, take only one-half (1/2) of your 4mg  blue-colored warfarin tablets. Patient verbalized understanding of these instructions.     Patient advised to contact clinic or seek medical attention if signs/symptoms of bleeding or thromboembolism occur.  Patient verbalized understanding by repeating back information and was advised to contact me if further medication-related questions arise. Patient was also provided an information handout.  Follow-up Return in 1 week (on 10/02/2017) for Follow up INR at 1000h.  Elicia Lamp, PharmD, CPP  15 minutes spent face-to-face with the patient during the encounter. 50% of time spent on education. 50% of time was spent on fingerstick point of care INR sample collection, processing, results determination, dose adjustment and documentation in [email or web address].

## 2017-09-26 NOTE — Progress Notes (Signed)
INTERNAL MEDICINE TEACHING ATTENDING ADDENDUM - Kie Calvin M.D  Duration- indefinite, Indication- PE, DVT, INR- sub therapeutic. Agree with pharmacy recommendations as outlined in their note.      

## 2017-09-27 ENCOUNTER — Other Ambulatory Visit: Payer: Self-pay | Admitting: Internal Medicine

## 2017-09-27 NOTE — Telephone Encounter (Signed)
Next appt scheduled 11/19 with PCP. 

## 2017-10-02 ENCOUNTER — Telehealth: Payer: Self-pay | Admitting: *Deleted

## 2017-10-02 ENCOUNTER — Ambulatory Visit: Payer: Medicare Other

## 2017-10-02 ENCOUNTER — Ambulatory Visit (INDEPENDENT_AMBULATORY_CARE_PROVIDER_SITE_OTHER): Payer: Medicare Other

## 2017-10-02 DIAGNOSIS — I82409 Acute embolism and thrombosis of unspecified deep veins of unspecified lower extremity: Secondary | ICD-10-CM

## 2017-10-02 DIAGNOSIS — Z7901 Long term (current) use of anticoagulants: Secondary | ICD-10-CM | POA: Diagnosis not present

## 2017-10-02 DIAGNOSIS — Z5181 Encounter for therapeutic drug level monitoring: Secondary | ICD-10-CM

## 2017-10-02 DIAGNOSIS — Z86718 Personal history of other venous thrombosis and embolism: Secondary | ICD-10-CM

## 2017-10-02 LAB — POCT INR: INR: 1.9 — AB (ref 2.0–3.0)

## 2017-10-02 NOTE — Patient Instructions (Signed)
Patient instructed to take medications as defined in the Anti-coagulation Track section of this encounter.  Patient instructed to take today's dose.  Patient instructed to take one (1) 4mg  blue-colored warfarin tablets on Mondays, Wednesdays and Fridays. All other days, take only one-half (1/2) of the 4mg  blue-colored warfarin tablets.  Patient verbalized understanding of these instructions.

## 2017-10-02 NOTE — Telephone Encounter (Signed)
See phone documentation

## 2017-10-02 NOTE — Progress Notes (Signed)
Anticoagulation Management Caroline [name] is a 72 y.o. female who reports to the clinic for monitoring of warfarin treatment.    Indication: DVT and hx of PE Duration: indefinite Supervising physician: Earl Lagos  Anticoagulation Clinic Visit History: Patient does not report signs/symptoms of bleeding or thromboembolism  Other recent changes: no diet, medications, lifestyle changes reported by the patient at this visit Anticoagulation Episode Summary    Current INR goal:   2.0-3.0  TTR:   81.8 % (2.7 y)  Next INR check:   10/23/2017  INR from last check:   1.9! 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 Penicillins Anaphylaxis, Hives, Swelling and Rash    Has patient had a PCN reaction causing immediate rash, facial/tongue/throat swelling, SOB or lightheadedness with hypotension: Yes Has patient had a PCN reaction causing severe rash involving mucus membranes or skin necrosis: Yes Has patient had a PCN reaction that required hospitalization Yes Has patient had a PCN reaction occurring within the last 10 years: No If all of the above answers are "NO", then may proceed with Cephalosporin use.   . Cabbage Itching  . Keflex [Cephalexin] Hives    And feeling of throat tightness  . Shellfish Allergy Swelling  . Tomato Swelling  . Latex Itching and Rash   Prior to Admission medications   Medication Sig Start Date End Date Taking? Authorizing Provider  budesonide-formoterol (SYMBICORT) 160-4.5 MCG/ACT inhaler Inhale 2 puffs into the lungs 2 (two) times daily. 08/29/17  Yes Molt, Bethany, DO  calcium citrate-vitamin D (CITRACAL+D) 315-200 MG-UNIT tablet Take 2 tablets daily by mouth.  11/14/16  Yes [name], MD  carvedilol (COREG) 12.5 MG tablet TAKE 1 TABLET (12.5 MG TOTAL) BY MOUTH 2 (TWO) TIMES DAILY. 08/03/17 11/01/17 Yes Camnitz, Will [name], MD  famotidine (PEPCID) 20 MG tablet Take 1 tablet (20 mg total) by mouth 2 (two) times daily. 08/29/17  Yes Molt, Bethany, DO  fluticasone (FLONASE) 50 MCG/ACT nasal spray PLACE 2 SPRAYS DAILY INTO BOTH NOSTRILS. 09/28/17  Yes Molt, Bethany, DO  furosemide (LASIX) 40 MG tablet Take 1 tablet (40 mg total) by mouth every other day. 11/29/16  Yes Molt, Bethany, DO  losartan (COZAAR) 100 MG tablet Take 1 tablet (100 mg total) by mouth daily. 08/29/17  Yes Molt, Bethany, DO  Magnesium Oxide 400 MG CAPS Take 1 capsule (400 mg total) by mouth 2 (two) times daily. 01/26/17  Yes Camnitz, Will [name], MD  meclizine (ANTIVERT) 25 MG tablet TAKE 1 TABLET BY MOUTH 3 TIMES DAILY AS NEEDED 07/12/17  Yes Molt, Bethany, DO  metFORMIN (GLUCOPHAGE-XR) 500 MG 24 hr tablet TAKE 1 TABLET BY MOUTH EVERY DAY WITH BREAKFAST 08/28/17  Yes Molt, Bethany, DO  montelukast (SINGULAIR) 10 MG tablet Take 1 tablet (10 mg total) by mouth at bedtime. 08/29/17  Yes Molt, Bethany, DO  pantoprazole (PROTONIX) 40 MG tablet Take 1 tablet (40 mg total) by mouth daily. 09/01/17  Yes Molt, Bethany, DO  PROAIR HFA 108 (90 Base) MCG/ACT inhaler TAKE 2 PUFFS BY MOUTH EVERY 6 HOURS AS NEEDED FOR WHEEZE OR SHORTNESS OF  BREATH 08/16/17  Yes Molt, Bethany, DO  RESTASIS 0.05 % ophthalmic emulsion PLACE 1 DROP INTO BOTH EYES 2 (TWO) TIMES DAILY. USE ONCE DAILY 08/29/17  Yes Molt, Bethany, DO  rosuvastatin (CRESTOR) 20 MG tablet Take 1 tablet (20 mg total) by mouth daily. 06/08/17  Yes Angelita Ingles, MD  warfarin (COUMADIN) 4 MG tablet Take 1 tablet (4mg ) by mouth once-daily at 6pm on Mondays, Wednesdays and Fridays. On all other days, take only 1/2 tablet. Patient taking differently: Take 2-4 mg by mouth See admin instructions. Take 1 tablet (4mg ) by mouth daily  on Mondays, Wednesdays and  Fridays. On all other days, take  1/2 tablet. 03/28/17  Yes Molt, Toma Copier, DO   Past Medical History:  Diagnosis Date  . Arthritis   . Asthma   . Bronchitis   . CHF (congestive heart failure) (HCC)   . DVT, lower extremity, recurrent (HCC)    Patient had unprovoked PE on 2002 and DVT in right lower extremety 2008.  [name] GERD (gastroesophageal reflux disease)   . History of kidney stones   . Hypertension   . PE (pulmonary embolism)    Patient had unprovoked PE on 2002  . PONV (postoperative nausea and vomiting)   . Vertigo    Social History   Socioeconomic History  . Marital status: Divorced    Spouse name: Not on file  . Number of children: 5  . Years of education: 9 th grade  . Highest education level: 9th grade  Occupational History  . Occupation: Merchandiser, retail  . Financial resource strain: Somewhat hard  . Food insecurity:    Worry: Never true    Inability: Sometimes true  . Transportation needs:    Medical: No    Non-medical: Yes  Tobacco Use  . Smoking status: Former Smoker    Types: Cigarettes    Last attempt to quit: 09/18/1988    Years since quitting: 29.0  . Smokeless tobacco: Never Used  . Tobacco comment: 6 pack year smoking history as a teen  Substance and Sexual Activity  . Alcohol use: No  . Drug use: No  . Sexual activity: Never    Birth control/protection: None  Lifestyle  . Physical activity:    Days per week: 2 days    Minutes per session: 20 min  . Stress: To some extent  Relationships  . Social connections:    Talks on phone: More than three times a week    Gets together: Once a week    Attends religious service: More than 4 times per year    Active member of club or organization: No    Attends meetings of clubs or organizations: Never    Relationship status: Divorced  Other Topics Concern  . Not on file  Social History Narrative   Current Social History 05/17/2017        Patient lives with 91 yo granddaughter" in a/an home / condo  / townhome which is 1 story/stories. There are not steps up to the entrance the patient uses.       Patient's method of transportation is church member.      The highest level of education was some high school.      The patient currently retired.      Identified important Relationships are God, family       Pets : 1 lab/pitt mix named Cinnamon       Interests / Fun: Church,TV  Current Stressors: "Me and my granddaughter"       Religious / Personal Beliefs: I'm Holiness and I love people"       Other: "I'd give away my last dime."    Family History  Problem Relation Age of Onset  . Cancer Mother   . Hypertension Sister   . Diabetes Sister   . Hypertension Brother   . Diabetes Brother   . Hypertension Sister   . Diabetes Sister   . Colon cancer Other   . Heart Problems Other 34       sister's child, open heart surgery  . Stroke Father   . Diabetes Son   . Hypertension Son   . Kidney disease Son        on dialysis  . Hypertension Sister   . Diabetes Sister   . Hypertension Sister   . Diabetes Sister   . Cancer Brother   . Hypertension Son   . Diabetes Son   . Hypertension Daughter   . Schizophrenia Daughter     ASSESSMENT Recent Results: The most recent result is correlated with 18 mg per week: Lab Results  Component Value Date   INR 1.9 (A) 10/02/2017   INR 1.9 (A) 09/25/2017   INR 2.4 09/04/2017    Anticoagulation Dosing: Description   Take one (1) of your 4mg  blue-colored warfarin tablets on Mondays, Wednesdays and Fridays. All other days, take only one-half (1/2) of your 4mg  blue-colored warfarin tablets.      INR today: Subtherapeutic  PLAN Weekly dose was increased by 10% to 20 mg per week  Patient Instructions  Patient instructed to take medications as defined in the Anti-coagulation Track section of this encounter.  Patient instructed to take today's dose.  Patient instructed to take one (1) 4mg  blue-colored warfarin tablets on Mondays,  Wednesdays and Fridays. All other days, take only one-half (1/2) of the 4mg  blue-colored warfarin tablets.  Patient verbalized understanding of these instructions.     Patient advised to contact clinic or seek medical attention if signs/symptoms of bleeding or thromboembolism occur.  Patient verbalized understanding by repeating back information and was advised to contact me if further medication-related questions arise. Patient was also provided an information handout.  Follow-up Return in 3 weeks (on 10/23/2017) for INR Follow-up at 1000.  Lenward Chancellor, PharmD PGY1 Pharmacy Resident Phone [phone] 10/02/2017  11:23 AM  15 minutes spent face-to-face with the patient during the encounter. 50% of time spent on education. 50% of time was spent on finger stick point of care INR collecting, processing, dose adjustments, and documenting.

## 2017-10-03 ENCOUNTER — Telehealth: Payer: Self-pay | Admitting: *Deleted

## 2017-10-03 NOTE — Progress Notes (Signed)
INTERNAL MEDICINE TEACHING ATTENDING ADDENDUM - Graelyn Bihl M.D  Duration- indefinite, Indication- PE, DVT, INR- sub therapeutic. Agree with pharmacy recommendations as outlined in their note.      

## 2017-10-03 NOTE — Telephone Encounter (Signed)
Received fax from [facility] requesting Rx for spacer device for patient. Kinnie Feil, RN, BSN

## 2017-10-03 NOTE — Telephone Encounter (Signed)
requested

## 2017-10-06 NOTE — Telephone Encounter (Signed)
Placed order for DME spacer a few days ago. Unsure if it went through to pharmacy? If not, would patient be able to have a spacer from clinic if possible?

## 2017-10-10 NOTE — Telephone Encounter (Signed)
Thank you so much

## 2017-10-10 NOTE — Telephone Encounter (Signed)
Patient notified that she can have spacer device free of charge from [facility] States she will pick it up at next coumadin check on 10/23/2017. Kinnie Feil, RN, BSN

## 2017-10-15 ENCOUNTER — Encounter: Payer: Self-pay | Admitting: *Deleted

## 2017-10-22 ENCOUNTER — Other Ambulatory Visit: Payer: Self-pay | Admitting: Internal Medicine

## 2017-10-22 DIAGNOSIS — J301 Allergic rhinitis due to pollen: Secondary | ICD-10-CM

## 2017-10-23 ENCOUNTER — Ambulatory Visit (INDEPENDENT_AMBULATORY_CARE_PROVIDER_SITE_OTHER): Payer: Medicare Other

## 2017-10-23 DIAGNOSIS — Z7901 Long term (current) use of anticoagulants: Secondary | ICD-10-CM | POA: Diagnosis not present

## 2017-10-23 DIAGNOSIS — Z86718 Personal history of other venous thrombosis and embolism: Secondary | ICD-10-CM

## 2017-10-23 DIAGNOSIS — I82409 Acute embolism and thrombosis of unspecified deep veins of unspecified lower extremity: Secondary | ICD-10-CM | POA: Diagnosis not present

## 2017-10-23 DIAGNOSIS — Z5181 Encounter for therapeutic drug level monitoring: Secondary | ICD-10-CM

## 2017-10-23 LAB — POCT INR: INR: 2.2 (ref 2.0–3.0)

## 2017-10-23 NOTE — Progress Notes (Signed)
Anticoagulation Management Caroline [name] is a 72 y.o. female who reports to the clinic for monitoring of warfarin treatment.    Indication: hx of recurrent DVT, hx PE  Duration: indefinite Supervising physician: Debe Coder  Anticoagulation Clinic Visit History: Patient does not report signs/symptoms of bleeding or thromboembolism  Other recent changes: no diet, medications, lifestyle changes reported by patient Anticoagulation Episode Summary    Current INR goal:   2.0-3.0  TTR:   81.5 % (2.7 y)  Next INR check:   11/13/2017  INR from last check:   2.2 (10/23/2017)  Weekly max warfarin dose:     Target end date:     INR check location:   Anticoagulation Clinic  Preferred lab:     Send INR reminders to:   ANTICOAG [facility]   Indications   DVT lower extremity recurrent (HCC) [I82.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 Penicillins Anaphylaxis, Hives, Swelling and Rash    Has patient had a PCN reaction causing immediate rash, facial/tongue/throat swelling, SOB or lightheadedness with hypotension: Yes Has patient had a PCN reaction causing severe rash involving mucus membranes or skin necrosis: Yes Has patient had a PCN reaction that required hospitalization Yes Has patient had a PCN reaction occurring within the last 10 years: No If all of the above answers are "NO", then may proceed with Cephalosporin use.   . Cabbage Itching  . Keflex [Cephalexin] Hives    And feeling of throat tightness  . Shellfish Allergy Swelling  . Tomato Swelling  . Latex Itching and Rash   Prior to Admission medications   Medication Sig Start Date End Date Taking? Authorizing Provider  budesonide-formoterol (SYMBICORT) 160-4.5 MCG/ACT inhaler Inhale 2 puffs into the lungs 2 (two) times daily. 08/29/17  Yes Molt, Bethany, DO  calcium citrate-vitamin D (CITRACAL+D) 315-200 MG-UNIT tablet Take 2 tablets daily by mouth. 11/14/16   Yes [name], MD  carvedilol (COREG) 12.5 MG tablet TAKE 1 TABLET (12.5 MG TOTAL) BY MOUTH 2 (TWO) TIMES DAILY. 08/03/17 11/01/17 Yes Camnitz, Will [name], MD  famotidine (PEPCID) 20 MG tablet Take 1 tablet (20 mg total) by mouth 2 (two) times daily. 08/29/17  Yes Molt, Bethany, DO  fluticasone (FLONASE) 50 MCG/ACT nasal spray PLACE 2 SPRAYS DAILY INTO BOTH NOSTRILS. 09/28/17  Yes Molt, Bethany, DO  furosemide (LASIX) 40 MG tablet Take 1 tablet (40 mg total) by mouth every other day. 11/29/16  Yes Molt, Bethany, DO  losartan (COZAAR) 100 MG tablet Take 1 tablet (100 mg total) by mouth daily. 08/29/17  Yes Molt, Bethany, DO  Magnesium Oxide 400 MG CAPS Take 1 capsule (400 mg total) by mouth 2 (two) times daily. 01/26/17  Yes Camnitz, Will [name], MD  meclizine (ANTIVERT) 25 MG tablet TAKE 1 TABLET BY MOUTH 3 TIMES DAILY AS NEEDED 07/12/17  Yes Molt, Bethany, DO  metFORMIN (GLUCOPHAGE-XR) 500 MG 24 hr tablet TAKE 1 TABLET BY MOUTH EVERY DAY WITH BREAKFAST 08/28/17  Yes Molt, Bethany, DO  montelukast (SINGULAIR) 10 MG tablet Take 1 tablet (10 mg total) by mouth at bedtime. 08/29/17  Yes Molt, Bethany, DO  pantoprazole (PROTONIX) 40 MG tablet Take 1 tablet (40 mg total) by mouth daily. 09/01/17  Yes Molt, Bethany, DO  PROAIR HFA 108 (90 Base) MCG/ACT inhaler TAKE 2 PUFFS BY MOUTH EVERY 6 HOURS AS NEEDED FOR WHEEZE OR SHORTNESS OF BREATH 08/16/17  Yes Molt, Bethany, DO  RESTASIS 0.05 % ophthalmic emulsion PLACE 1 DROP INTO BOTH EYES 2 (TWO) TIMES DAILY. USE ONCE DAILY 08/29/17  Yes Molt, Bethany, DO  rosuvastatin (CRESTOR) 20 MG tablet Take 1 tablet (20 mg total) by mouth daily. 06/08/17  Yes Angelita Ingles, MD  warfarin (COUMADIN) 4 MG tablet Take 1 tablet (4mg ) by mouth once-daily at 6pm on Mondays, Wednesdays and Fridays. On all other days, take only 1/2 tablet. Patient taking differently: Take 2-4 mg by mouth See admin instructions. Take 1 tablet (4mg ) by mouth daily  on Mondays, Wednesdays and Fridays.  On all other days, take  1/2 tablet. 03/28/17  Yes Molt, Toma Copier, DO   Past Medical History:  Diagnosis Date  . Arthritis   . Asthma   . Bronchitis   . CHF (congestive heart failure) (HCC)   . DVT, lower extremity, recurrent (HCC)    Patient had unprovoked PE on 2002 and DVT in right lower extremety 2008.  [name] GERD (gastroesophageal reflux disease)   . History of kidney stones   . Hypertension   . PE (pulmonary embolism)    Patient had unprovoked PE on 2002  . PONV (postoperative nausea and vomiting)   . Vertigo    Social History   Socioeconomic History  . Marital status: Divorced    Spouse name: Not on file  . Number of children: 5  . Years of education: 9 th grade  . Highest education level: 9th grade  Occupational History  . Occupation: Merchandiser, retail  . Financial resource strain: Somewhat hard  . Food insecurity:    Worry: Never true    Inability: Sometimes true  . Transportation needs:    Medical: No    Non-medical: Yes  Tobacco Use  . Smoking status: Former Smoker    Types: Cigarettes    Last attempt to quit: 09/18/1988    Years since quitting: 29.1  . Smokeless tobacco: Never Used  . Tobacco comment: 6 pack year smoking history as a teen  Substance and Sexual Activity  . Alcohol use: No  . Drug use: No  . Sexual activity: Never    Birth control/protection: None  Lifestyle  . Physical activity:    Days per week: 2 days    Minutes per session: 20 min  . Stress: To some extent  Relationships  . Social connections:    Talks on phone: More than three times a week    Gets together: Once a week    Attends religious service: More than 4 times per year    Active member of club or organization: No    Attends meetings of clubs or organizations: Never    Relationship status: Divorced  Other Topics Concern  . Not on file  Social History Narrative   Current Social History 05/17/2017        Patient lives with 34 yo granddaughter" in a/an home / condo /  townhome which is 1 story/stories. There are not steps up to the entrance the patient uses.       Patient's method of transportation is church member.      The highest level of education was some high school.      The patient currently retired.      Identified important Relationships are God, family       Pets : 1 lab/pitt mix named Cinnamon       Interests / Fun: Church,TV       Current Stressors: "  Me and my granddaughter"       Religious / Personal Beliefs: I'm Holiness and I love people"       Other: "I'd give away my last dime."    Family History  Problem Relation Age of Onset  . Cancer Mother   . Hypertension Sister   . Diabetes Sister   . Hypertension Brother   . Diabetes Brother   . Hypertension Sister   . Diabetes Sister   . Colon cancer Other   . Heart Problems Other 34       sister's child, open heart surgery  . Stroke Father   . Diabetes Son   . Hypertension Son   . Kidney disease Son        on dialysis  . Hypertension Sister   . Diabetes Sister   . Hypertension Sister   . Diabetes Sister   . Cancer Brother   . Hypertension Son   . Diabetes Son   . Hypertension Daughter   . Schizophrenia Daughter     ASSESSMENT Recent Results: The most recent result is correlated with 20 mg per week: Lab Results  Component Value Date   INR 2.2 10/23/2017   INR 1.9 (A) 10/02/2017   INR 1.9 (A) 09/25/2017    Anticoagulation Dosing: Description   Take one (1) of your 4mg  blue-colored warfarin tablets on Mondays, Wednesdays and Fridays. All other days, take only one-half (1/2) of your 4mg  blue-colored warfarin tablets.      INR today: Therapeutic  PLAN Weekly dose was unchanged   Patient Instructions  Patient instructed to take medications as defined in the Anti-coagulation Track section of this encounter.  Patient instructed to take today's dose.  Patient instructed to take one (1) of the 4mg  blue-colored warfarin tablets on Mondays, Wednesdays and  Fridays. All other days, take only one-half (1/2) of the 4mg  blue-colored warfarin tablets.  Patient verbalized understanding of these instructions.     Patient advised to contact clinic or seek medical attention if signs/symptoms of bleeding or thromboembolism occur.  Patient verbalized understanding by repeating back information and was advised to contact me if further medication-related questions arise. Patient was also provided an information handout.  Follow-up Return in about 3 weeks (around 11/13/2017) for f/u INR at 1030.  Tanya K Makhlouf  15 minutes spent face-to-face with the patient during the encounter. 50% of time spent on education. 50% of time was spent on point of care INR sample collection, result processing, and documentation.

## 2017-10-23 NOTE — Patient Instructions (Signed)
Patient instructed to take medications as defined in the Anti-coagulation Track section of this encounter.  Patient instructed to take today's dose.  Patient instructed to take one (1) of the 4mg  blue-colored warfarin tablets on Mondays, Wednesdays and Fridays. All other days, take only one-half (1/2) of the 4mg  blue-colored warfarin tablets.  Patient verbalized understanding of these instructions.

## 2017-10-25 ENCOUNTER — Other Ambulatory Visit: Payer: Self-pay | Admitting: Internal Medicine

## 2017-10-25 DIAGNOSIS — I824Y9 Acute embolism and thrombosis of unspecified deep veins of unspecified proximal lower extremity: Secondary | ICD-10-CM

## 2017-10-26 ENCOUNTER — Telehealth: Payer: Self-pay | Admitting: *Deleted

## 2017-10-26 MED ORDER — HYDROCORTISONE 2.5 % RE CREA: 1.0000 "application " | TOPICAL_CREAM | Freq: Two times a day (BID) | RECTAL | 0 refills | Status: DC

## 2017-10-26 NOTE — Telephone Encounter (Signed)
I sent it in 

## 2017-10-26 NOTE — Telephone Encounter (Signed)
Pt states she needs a refill on hemorrhoid cream - Proctosol HC 2.5% cream. Not on current med list. Pt states she uses it as needed.

## 2017-10-27 ENCOUNTER — Other Ambulatory Visit: Payer: Self-pay | Admitting: Internal Medicine

## 2017-10-27 DIAGNOSIS — J4521 Mild intermittent asthma with (acute) exacerbation: Secondary | ICD-10-CM

## 2017-10-30 ENCOUNTER — Ambulatory Visit (INDEPENDENT_AMBULATORY_CARE_PROVIDER_SITE_OTHER): Payer: Medicare Other | Admitting: Internal Medicine

## 2017-10-30 ENCOUNTER — Other Ambulatory Visit: Payer: Self-pay

## 2017-10-30 VITALS — BP 159/60 | HR 79 | Temp 97.3°F | Ht 63.0 in | Wt 285.0 lb

## 2017-10-30 DIAGNOSIS — F329 Major depressive disorder, single episode, unspecified: Secondary | ICD-10-CM

## 2017-10-30 DIAGNOSIS — Z91030 Bee allergy status: Secondary | ICD-10-CM

## 2017-10-30 DIAGNOSIS — Z79899 Other long term (current) drug therapy: Secondary | ICD-10-CM

## 2017-10-30 DIAGNOSIS — J45901 Unspecified asthma with (acute) exacerbation: Secondary | ICD-10-CM

## 2017-10-30 DIAGNOSIS — J069 Acute upper respiratory infection, unspecified: Secondary | ICD-10-CM | POA: Diagnosis not present

## 2017-10-30 DIAGNOSIS — B9789 Other viral agents as the cause of diseases classified elsewhere: Principal | ICD-10-CM

## 2017-10-30 DIAGNOSIS — Z7951 Long term (current) use of inhaled steroids: Secondary | ICD-10-CM | POA: Diagnosis not present

## 2017-10-30 DIAGNOSIS — R42 Dizziness and giddiness: Secondary | ICD-10-CM

## 2017-10-30 MED ORDER — MECLIZINE HCL 25 MG PO TABS: 25.0000 mg | ORAL_TABLET | Freq: Three times a day (TID) | ORAL | 1 refills | Status: DC | PRN

## 2017-10-30 MED ORDER — PREDNISONE 20 MG PO TABS: 40.0000 mg | ORAL_TABLET | Freq: Every day | ORAL | 0 refills | Status: AC

## 2017-10-30 MED ORDER — PHENOL 1.4 % MT LIQD: 2.0000 | OROMUCOSAL | 0 refills | Status: DC | PRN

## 2017-10-30 MED ORDER — EPINEPHRINE 0.3 MG/0.3ML IJ SOAJ: 0.3000 mg | Freq: Once | INTRAMUSCULAR | 0 refills | Status: AC

## 2017-10-30 NOTE — Progress Notes (Signed)
   CC: cough  HPI:  [name] is a 72 y.o. with PMH as below presenting with sore throat and cough.   Please see A&P for assessment of the patient's chronic medical conditions.   Past Medical History:  Diagnosis Date  . Arthritis   . Asthma   . Bronchitis   . CHF (congestive heart failure) (HCC)   . DVT, lower extremity, recurrent (HCC)    Patient had unprovoked PE on 2002 and DVT in right lower extremety 2008.  [name] GERD (gastroesophageal reflux disease)   . History of kidney stones   . Hypertension   . PE (pulmonary embolism)    Patient had unprovoked PE on 2002  . PONV (postoperative nausea and vomiting)   . Vertigo    Review of Systems:   Review of Systems  Constitutional: Negative for chills and fever.  HENT: Positive for congestion, sinus pain and sore throat. Negative for ear discharge and ear pain.   Eyes: Negative for photophobia and pain.  Respiratory: Positive for cough, sputum production and wheezing. Negative for shortness of breath.   Gastrointestinal: Negative for diarrhea, nausea and vomiting.  Neurological: Negative for dizziness and headaches.    Physical Exam:  Constitution: NAD, obese HENT: TM wnl bilaterally, erythema of throat, no exudates, nares wnl b/l Eyes: watery, EOM intact Cardio: RRR, no m/r/g, no pitting edema Respiratory: decreased breath sounds, CTAB, no wheezing   Vitals:   10/30/17 1458  BP: (!) 159/60  Pulse: 79  Temp: (!) 97.3 F (36.3 C)  TempSrc: Oral  SpO2: 96%  Weight: 285 lb (129.3 kg)  Height: 5\' 3"  (1.6 m)    Assessment & Plan:   See Encounters Tab for problem based charting.  Patient seen with Dr. Rogelia Boga

## 2017-10-30 NOTE — Patient Instructions (Addendum)
Thank you for allowing [place] to provide your care today. Today we discussed upper respiratory viral infection and depression.   For your sore throat, please gargle warm salt water as needed or use chlorseptic throat spray as needed. This will help numb your sore throat.   For your mild asthma exacerbation, please START taking:  Prednisone 40 mg, two tablets per day for five days  For your depression, we will have our Behavioral Health specialist call you to set up an appointment to further discuss how you're feeling.   For your anaphylactic allergies, I have prescribed an epi-pen. Please carry this with you at all times.    Please follow-up in two weeks. You may cancel your appointment if your symptoms have improved. If your symptoms worsen significantly or you become short of breath, please do not hesitate to call our office or go to the emergency room.   Should you have any questions or concerns please call the internal medicine clinic at [phone].

## 2017-10-31 ENCOUNTER — Telehealth: Payer: Self-pay | Admitting: Pharmacist

## 2017-10-31 ENCOUNTER — Telehealth: Payer: Self-pay | Admitting: Licensed Clinical Social Worker

## 2017-10-31 ENCOUNTER — Other Ambulatory Visit: Payer: Self-pay | Admitting: Internal Medicine

## 2017-10-31 DIAGNOSIS — Z91030 Bee allergy status: Secondary | ICD-10-CM

## 2017-10-31 DIAGNOSIS — F329 Major depressive disorder, single episode, unspecified: Secondary | ICD-10-CM | POA: Insufficient documentation

## 2017-10-31 DIAGNOSIS — F32A Depression, unspecified: Secondary | ICD-10-CM | POA: Insufficient documentation

## 2017-10-31 HISTORY — DX: Bee allergy status: Z91.030

## 2017-10-31 NOTE — Assessment & Plan Note (Signed)
PHQ-9 score 11. She states she has been feeling depressed recently. She is interested in talking our in house counselor who is not here today. She is not interested in starting medications at this time.   - referred to in house counselor

## 2017-10-31 NOTE — Telephone Encounter (Signed)
Contacted the patient to schedule an appointment for a behavioral health appointment.

## 2017-10-31 NOTE — Assessment & Plan Note (Signed)
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

## 2017-10-31 NOTE — Progress Notes (Signed)
I reviewed the anticoagulation clinic note.  Patient is on [facility] for h/o recurrent DVT.  Her INR was at goal and dose unchanged.

## 2017-10-31 NOTE — Telephone Encounter (Signed)
Patient stopped by Anticoagulation Management Clinic space yesterday at end of PCP appointment advising she was being put on a 5 day steroid burst for URI. She was instructed to decrease her warfarin dose empirically to 1/2 x 4mg  (2mg  dose) x 5 days, then, she will revert back to her usual warfarin regimen as documented at last visit.

## 2017-10-31 NOTE — Assessment & Plan Note (Addendum)
URI with mild asthma exacerbation. She states last Thursday she started having sore throat and cough deep in her chest productive with white, opaque sputum. Feels like her throat is scratchy and cannot be clear. She has felt feverish but no fever today. She endorses sinus pain, cogestion, and headache. She has some SOB and feels like she is wheezing. Her grandson was also recently ill, and she believes this came from him. She uses symbicort, proair, and singular and feels that none of her medications seem to be helping. She also tried using her grandson's nebulizer and states this helped somewhat.  Symptoms started last Thursday, asthma inhaler is not helping. Think she caught this from her grandson. She does not smoke.  She has also tried mucinex and robitussen, which have not helped much.   - prednisone 40 mg 5 days - offered nebulizer treatment but she declined

## 2017-11-01 ENCOUNTER — Telehealth: Payer: Self-pay | Admitting: Pharmacist

## 2017-11-01 NOTE — Progress Notes (Addendum)
Internal Medicine Clinic Attending  I saw and evaluated the patient.  I personally confirmed the key portions of the history and exam documented by Dr. Seawell and I reviewed pertinent patient test results.  The assessment, diagnosis, and plan were formulated together and I agree with the documentation in the resident's note.     

## 2017-11-02 NOTE — Telephone Encounter (Signed)
Steroid-Induced Hyperglycemia Prevention and Management Caroline [name] is a 72 y.o. female who meets criteria for [facility] glucose monitoring program (diabetes patient prescribed short course of steroids).  Patient reports she does not check BG at home so declined the service.  Marzetta Board 4:42 PM 11/02/2017

## 2017-11-06 ENCOUNTER — Ambulatory Visit (INDEPENDENT_AMBULATORY_CARE_PROVIDER_SITE_OTHER): Payer: Medicare Other | Admitting: Internal Medicine

## 2017-11-06 ENCOUNTER — Encounter: Payer: Self-pay | Admitting: Internal Medicine

## 2017-11-06 ENCOUNTER — Other Ambulatory Visit: Payer: Self-pay

## 2017-11-06 ENCOUNTER — Ambulatory Visit (INDEPENDENT_AMBULATORY_CARE_PROVIDER_SITE_OTHER): Payer: Medicare Other | Admitting: Pharmacist

## 2017-11-06 VITALS — BP 145/66 | HR 66 | Temp 98.0°F | Ht 63.0 in | Wt 280.9 lb

## 2017-11-06 DIAGNOSIS — Z79899 Other long term (current) drug therapy: Secondary | ICD-10-CM | POA: Diagnosis not present

## 2017-11-06 DIAGNOSIS — I82409 Acute embolism and thrombosis of unspecified deep veins of unspecified lower extremity: Secondary | ICD-10-CM

## 2017-11-06 DIAGNOSIS — Z7901 Long term (current) use of anticoagulants: Secondary | ICD-10-CM

## 2017-11-06 DIAGNOSIS — Z86718 Personal history of other venous thrombosis and embolism: Secondary | ICD-10-CM | POA: Diagnosis not present

## 2017-11-06 DIAGNOSIS — Z5181 Encounter for therapeutic drug level monitoring: Secondary | ICD-10-CM | POA: Diagnosis not present

## 2017-11-06 DIAGNOSIS — J4530 Mild persistent asthma, uncomplicated: Secondary | ICD-10-CM | POA: Diagnosis not present

## 2017-11-06 DIAGNOSIS — F329 Major depressive disorder, single episode, unspecified: Secondary | ICD-10-CM | POA: Diagnosis not present

## 2017-11-06 DIAGNOSIS — J4521 Mild intermittent asthma with (acute) exacerbation: Secondary | ICD-10-CM

## 2017-11-06 LAB — POCT INR: INR: 2.7 (ref 2.0–3.0)

## 2017-11-06 NOTE — Progress Notes (Signed)
Internal Medicine Clinic Attending  I saw and evaluated the patient.  I personally confirmed the key portions of the history and exam documented by Dr. Seawell and I reviewed pertinent patient test results.  The assessment, diagnosis, and plan were formulated together and I agree with the documentation in the resident's note.     

## 2017-11-06 NOTE — Assessment & Plan Note (Signed)
She has an upcoming appointment with Phineas Semen to discuss her depression.

## 2017-11-06 NOTE — Assessment & Plan Note (Signed)
She states she is feeling much better since her visit one week ago. She still has some wheezing and cough that is relieved by delsym, which helps her cough. She has not had SOB, sore throat or sinus pain or congestion. She has been using both her granddaugher's albuterol nebulizer and her albuterol inhaler. We discussed that she should not be using both and only needed her inhaler. When discussing how often she needs her albuterol inhaler she said she has gotten into the habit of just using it every day. She agreed to keep track of how often she is woken up by her asthma and how often she needs the inhaler to determine if she needs an increase in therapy.   - f/u in one month for Hba1c check - consider increasing to fluticasone medium dose inhaler if necessary

## 2017-11-06 NOTE — Patient Instructions (Signed)
Patient instructed to take medications as defined in the Anti-coagulation Track section of this encounter.  Patient instructed to OMIT today's dose.  Patient instructed to take only one-half (1/2) of your 4mg  blue-colored warfarin tablets by mouth, once-daily at [facility] each day.  Patient verbalized understanding of these instructions.

## 2017-11-06 NOTE — Progress Notes (Signed)
Anticoagulation Management Caroline [name] is a 72 y.o. female who reports to the clinic for monitoring of warfarin treatment.    Indication: DVT, lower extremity, recurrent, History of; History of PE (resolved); Long term current use of anticoagulant.   Duration: indefinite Supervising physician: Blanch Media  Anticoagulation Clinic Visit History: Patient does not report signs/symptoms of bleeding or thromboembolism  Other recent changes: No diet, medications, lifestyle changes endorsed.  Anticoagulation Episode Summary    Current INR goal:   2.0-3.0  TTR:   81.8 % (2.8 y)  Next INR check:   11/20/2017  INR from last check:   2.7 (11/06/2017)  Weekly max warfarin dose:     Target end date:     INR check location:   Anticoagulation Clinic  Preferred lab:     Send INR reminders to:   [facility]   Indications   DVT lower extremity recurrent (HCC) [I82.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 Penicillins Anaphylaxis, Hives, Swelling and Rash    Has patient had a PCN reaction causing immediate rash, facial/tongue/throat swelling, SOB or lightheadedness with hypotension: Yes Has patient had a PCN reaction causing severe rash involving mucus membranes or skin necrosis: Yes Has patient had a PCN reaction that required hospitalization Yes Has patient had a PCN reaction occurring within the last 10 years: No If all of the above answers are "NO", then may proceed with Cephalosporin use.   . Cabbage Itching  . Keflex [Cephalexin] Hives    And feeling of throat tightness  . Shellfish Allergy Swelling  . Tomato Swelling  . Latex Itching and Rash   Prior to Admission medications   Medication Sig Start Date End Date Taking? Authorizing Provider  budesonide-formoterol (SYMBICORT) 160-4.5 MCG/ACT inhaler Inhale 2 puffs into the lungs 2 (two) times daily. 08/29/17  Yes Molt, Bethany, DO  calcium  citrate-vitamin D (CITRACAL+D) 315-200 MG-UNIT tablet Take 2 tablets daily by mouth. 11/14/16  Yes [name], MD  famotidine (PEPCID) 20 MG tablet Take 1 tablet (20 mg total) by mouth 2 (two) times daily. 08/29/17  Yes Molt, Bethany, DO  fluticasone (FLONASE) 50 MCG/ACT nasal spray PLACE 2 SPRAYS DAILY INTO BOTH NOSTRILS. 10/23/17  Yes Earl Lagos, MD  furosemide (LASIX) 40 MG tablet Take 1 tablet (40 mg total) by mouth every other day. 11/29/16  Yes Molt, Bethany, DO  hydrocortisone (PROCTOSOL HC) 2.5 % rectal cream Place 1 application rectally 2 (two) times daily. 10/26/17  Yes Earl Lagos, MD  losartan (COZAAR) 100 MG tablet Take 1 tablet (100 mg total) by mouth daily. 08/29/17  Yes Molt, Bethany, DO  Magnesium Oxide 400 MG CAPS Take 1 capsule (400 mg total) by mouth 2 (two) times daily. 01/26/17  Yes Camnitz, Will [name], MD  meclizine (ANTIVERT) 25 MG tablet Take 1 tablet (25 mg total) by mouth 3 (three) times daily as needed. 10/30/17  Yes Seawell, Jaimie A, DO  metFORMIN (GLUCOPHAGE-XR) 500 MG 24 hr tablet TAKE 1 TABLET BY MOUTH EVERY DAY WITH BREAKFAST 08/28/17  Yes Molt, Bethany, DO  montelukast (SINGULAIR) 10 MG tablet Take 1 tablet (10 mg total) by mouth at bedtime. 08/29/17  Yes Molt, Bethany, DO  pantoprazole (PROTONIX) 40 MG tablet Take 1 tablet (40 mg total) by mouth daily. 09/01/17  Yes Molt, Bethany, DO  phenol (CHLORASEPTIC) 1.4 % LIQD Use as directed 2 sprays in the  mouth or throat as needed for throat irritation / pain. 10/30/17  Yes Seawell, Jaimie A, DO  PROAIR HFA 108 (90 Base) MCG/ACT inhaler TAKE 2 PUFFS BY MOUTH EVERY 6 HOURS AS NEEDED FOR WHEEZE OR SHORTNESS OF BREATH 10/31/17  Yes Narendra, Nischal, MD  RESTASIS 0.05 % ophthalmic emulsion PLACE 1 DROP INTO BOTH EYES 2 (TWO) TIMES DAILY. USE ONCE DAILY 08/29/17  Yes Molt, Bethany, DO  rosuvastatin (CRESTOR) 20 MG tablet Take 1 tablet (20 mg total) by mouth daily. 06/08/17  Yes Angelita Ingles, MD  warfarin  (COUMADIN) 4 MG tablet TAKE 1 TAB BY MOUTH DAILY AT 6PM ON MON, WED. AND FRIDAYS. ON ALL OTHER DAYS, TAKE ONLY 1/2 TABLET. 10/25/17  Yes Narendra, Nischal, MD  carvedilol (COREG) 12.5 MG tablet TAKE 1 TABLET (12.5 MG TOTAL) BY MOUTH 2 (TWO) TIMES DAILY. 08/03/17 11/01/17  Regan Lemming, MD   Past Medical History:  Diagnosis Date  . Arthritis   . Asthma   . Bronchitis   . CHF (congestive heart failure) (HCC)   . DVT, lower extremity, recurrent (HCC)    Patient had unprovoked PE on 2002 and DVT in right lower extremety 2008.  [name] GERD (gastroesophageal reflux disease)   . History of kidney stones   . Hypertension   . PE (pulmonary embolism)    Patient had unprovoked PE on 2002  . PONV (postoperative nausea and vomiting)   . Vertigo    Social History   Socioeconomic History  . Marital status: Divorced    Spouse name: Not on file  . Number of children: 5  . Years of education: 9 th grade  . Highest education level: 9th grade  Occupational History  . Occupation: Merchandiser, retail  . Financial resource strain: Somewhat hard  . Food insecurity:    Worry: Never true    Inability: Sometimes true  . Transportation needs:    Medical: No    Non-medical: Yes  Tobacco Use  . Smoking status: Former Smoker    Types: Cigarettes    Last attempt to quit: 09/18/1988    Years since quitting: 29.1  . Smokeless tobacco: Never Used  . Tobacco comment: 6 pack year smoking history as a teen  Substance and Sexual Activity  . Alcohol use: No  . Drug use: No  . Sexual activity: Never    Birth control/protection: None  Lifestyle  . Physical activity:    Days per week: 2 days    Minutes per session: 20 min  . Stress: To some extent  Relationships  . Social connections:    Talks on phone: More than three times a week    Gets together: Once a week    Attends religious service: More than 4 times per year    Active member of club or organization: No    Attends meetings of clubs or  organizations: Never    Relationship status: Divorced  Other Topics Concern  . Not on file  Social History Narrative   Current Social History 05/17/2017        Patient lives with 45 yo granddaughter" in a/an home / condo / townhome which is 1 story/stories. There are not steps up to the entrance the patient uses.       Patient's method of transportation is church member.      The highest level of education was some high school.      The patient currently retired.      Identified important  Relationships are God, family       Pets : 1 lab/pitt mix named Engineer, materials / Fun: Church,TV       Current Stressors: "Me and my granddaughter"       Religious / Personal Beliefs: I'm Holiness and I love people"       Other: "I'd give away my last dime."    Family History  Problem Relation Age of Onset  . Cancer Mother   . Hypertension Sister   . Diabetes Sister   . Hypertension Brother   . Diabetes Brother   . Hypertension Sister   . Diabetes Sister   . Colon cancer Other   . Heart Problems Other 34       sister's child, open heart surgery  . Stroke Father   . Diabetes Son   . Hypertension Son   . Kidney disease Son        on dialysis  . Hypertension Sister   . Diabetes Sister   . Hypertension Sister   . Diabetes Sister   . Cancer Brother   . Hypertension Son   . Diabetes Son   . Hypertension Daughter   . Schizophrenia Daughter     ASSESSMENT Recent Results: The most recent result is correlated with 16 mg per week: Lab Results  Component Value Date   INR 2.7 11/06/2017   INR 2.2 10/23/2017   INR 1.9 (A) 10/02/2017    Anticoagulation Dosing: Description   Take only one-half (1/2) of your 4mg  blue-colored warfarin tablets by mouth, once-daily at [facility] each day.      INR today: Therapeutic  PLAN Weekly dose was decreased by 12% to 14 mg per week  Patient Instructions  Patient instructed to take medications as defined in the Anti-coagulation Track  section of this encounter.  Patient instructed to OMIT today's dose.  Patient instructed to take only one-half (1/2) of your 4mg  blue-colored warfarin tablets by mouth, once-daily at [facility] each day.  Patient verbalized understanding of these instructions.     Patient advised to contact clinic or seek medical attention if signs/symptoms of bleeding or thromboembolism occur.  Patient verbalized understanding by repeating back information and was advised to contact me if further medication-related questions arise. Patient was also provided an information handout.  Follow-up Return in 2 weeks (on 11/20/2017) for Follow up INR at 1030h.  Elicia Lamp, PharmD, CPP  15 minutes spent face-to-face with the patient during the encounter. 50% of time spent on education. 50% of time was spent on fingerstick point of care INR sample collection, processing, results determination, dose adjustment and documentation in [email or web address].

## 2017-11-06 NOTE — Progress Notes (Signed)
   CC: f/u asthma exacerbation   HPI:  [name] is a 72 y.o. with PMH as below presenting today for follow-up of mild asthma exacerbation.   Please see A&P for assessment of the patient's chronic medical conditions.   Past Medical History:  Diagnosis Date  . Arthritis   . Asthma   . Bronchitis   . CHF (congestive heart failure) (HCC)   . DVT, lower extremity, recurrent (HCC)    Patient had unprovoked PE on 2002 and DVT in right lower extremety 2008.  [name] GERD (gastroesophageal reflux disease)   . History of kidney stones   . Hypertension   . PE (pulmonary embolism)    Patient had unprovoked PE on 2002  . PONV (postoperative nausea and vomiting)   . Vertigo    Review of Systems:   Review of Systems  Constitutional: Negative for chills and fever.  HENT: Negative for sinus pain and sore throat.   Respiratory: Positive for cough and sputum production. Negative for shortness of breath and wheezing.   Cardiovascular: Negative for chest pain and palpitations.  Gastrointestinal: Negative for constipation, diarrhea, nausea and vomiting.  Neurological: Positive for headaches.   Physical Exam:  Constitution: NAD, obese HEENT: no injection, no erythema or exudates throat Cardio: RRR, no m/r/g Respiratory: +wheezing, mild, no rales or rhonchi   Vitals:   11/06/17 0917  BP: (!) 145/66  Pulse: 66  Temp: 98 F (36.7 C)  TempSrc: Oral  SpO2: 97%  Weight: 280 lb 14.4 oz (127.4 kg)  Height: 5\' 3"  (1.6 m)    Assessment & Plan:   See Encounters Tab for problem based charting.  Patient seen with Dr. Rogelia Boga

## 2017-11-06 NOTE — Patient Instructions (Addendum)
Thank you for allowing [place] to provide your care today. Today we discussed your asthma.    Today we made no changes to your medications.    Please keep track of how often you need your albuterol inhaler and if you are ever woken up from your asthma. Bring this with you next time you come to the doctor.  Please do not use the albuterol nebulizer and albuterol inhaler throughout the same day. These are the same medications.   Please follow-up in one month for HbA1c check.    Should you have any questions or concerns please call the internal medicine clinic at [phone].

## 2017-11-07 ENCOUNTER — Other Ambulatory Visit: Payer: Self-pay | Admitting: *Deleted

## 2017-11-07 DIAGNOSIS — J4521 Mild intermittent asthma with (acute) exacerbation: Secondary | ICD-10-CM

## 2017-11-08 MED ORDER — FAMOTIDINE 20 MG PO TABS: 20.0000 mg | ORAL_TABLET | Freq: Two times a day (BID) | ORAL | 1 refills | Status: DC

## 2017-11-15 ENCOUNTER — Other Ambulatory Visit: Payer: Self-pay | Admitting: Cardiology

## 2017-11-15 ENCOUNTER — Other Ambulatory Visit: Payer: Self-pay | Admitting: Oncology

## 2017-11-18 ENCOUNTER — Other Ambulatory Visit: Payer: Self-pay | Admitting: Internal Medicine

## 2017-11-18 DIAGNOSIS — J301 Allergic rhinitis due to pollen: Secondary | ICD-10-CM

## 2017-11-20 ENCOUNTER — Ambulatory Visit (INDEPENDENT_AMBULATORY_CARE_PROVIDER_SITE_OTHER): Payer: Medicare Other

## 2017-11-20 DIAGNOSIS — Z5181 Encounter for therapeutic drug level monitoring: Secondary | ICD-10-CM | POA: Diagnosis not present

## 2017-11-20 DIAGNOSIS — Z86718 Personal history of other venous thrombosis and embolism: Secondary | ICD-10-CM

## 2017-11-20 DIAGNOSIS — Z7901 Long term (current) use of anticoagulants: Secondary | ICD-10-CM | POA: Diagnosis not present

## 2017-11-20 DIAGNOSIS — I82409 Acute embolism and thrombosis of unspecified deep veins of unspecified lower extremity: Secondary | ICD-10-CM

## 2017-11-20 LAB — POCT INR: INR: 2.4 (ref 2.0–3.0)

## 2017-11-20 NOTE — Progress Notes (Signed)
Anticoagulation Management Caroline [name] is a 72 y.o. female who reports to the clinic for monitoring of warfarin treatment.    Indication: hx of recurrent DVT, long-term use of anticoagulation  Duration: indefinite Supervising physician: Earl Lagos  Anticoagulation Clinic Visit History: Patient does not report signs/symptoms of bleeding or thromboembolism  Other recent changes: no diet, medications, lifestyle changes reported by pt Anticoagulation Episode Summary    Current INR goal:   2.0-3.0  TTR:   82.0 % (2.8 y)  Next INR check:   12/11/2017  INR from last check:   2.4 (11/20/2017)  Weekly max warfarin dose:     Target end date:     INR check location:   Anticoagulation Clinic  Preferred lab:     Send INR reminders to:   ANTICOAG IMP   Indications   DVT lower extremity recurrent (HCC) [I82.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 Penicillins Anaphylaxis, Hives, Swelling and Rash    Has patient had a PCN reaction causing immediate rash, facial/tongue/throat swelling, SOB or lightheadedness with hypotension: Yes Has patient had a PCN reaction causing severe rash involving mucus membranes or skin necrosis: Yes Has patient had a PCN reaction that required hospitalization Yes Has patient had a PCN reaction occurring within the last 10 years: No If all of the above answers are "NO", then may proceed with Cephalosporin use.   . Cabbage Itching  . Keflex [Cephalexin] Hives    And feeling of throat tightness  . Shellfish Allergy Swelling  . Tomato Swelling  . Latex Itching and Rash   Prior to Admission medications   Medication Sig Start Date End Date Taking? Authorizing Provider  budesonide-formoterol (SYMBICORT) 160-4.5 MCG/ACT inhaler Inhale 2 puffs into the lungs 2 (two) times daily. 08/29/17   Molt, Bethany, DO  calcium citrate-vitamin D (CITRACAL+D) 315-200 MG-UNIT tablet Take 2 tablets  daily by mouth. 11/14/16   [name], MD  carvedilol (COREG) 12.5 MG tablet TAKE 1 TABLET (12.5 MG TOTAL) BY MOUTH 2 (TWO) TIMES DAILY. 08/03/17 11/01/17  Camnitz, Andree Coss, MD  famotidine (PEPCID) 20 MG tablet Take 1 tablet (20 mg total) by mouth 2 (two) times daily. 11/08/17   Earl Lagos, MD  fluticasone (FLONASE) 50 MCG/ACT nasal spray PLACE 2 SPRAYS DAILY INTO BOTH NOSTRILS. 10/23/17   Earl Lagos, MD  furosemide (LASIX) 40 MG tablet Take 1 tablet (40 mg total) by mouth every other day. 11/29/16   Molt, Bethany, DO  hydrocortisone (PROCTOSOL HC) 2.5 % rectal cream Place 1 application rectally 2 (two) times daily. 10/26/17   Earl Lagos, MD  losartan (COZAAR) 100 MG tablet Take 1 tablet (100 mg total) by mouth daily. 08/29/17   Molt, Bethany, DO  Magnesium Oxide 400 MG CAPS Take 1 capsule (400 mg total) by mouth 2 (two) times daily. 01/26/17   Camnitz, Andree Coss, MD  meclizine (ANTIVERT) 25 MG tablet Take 1 tablet (25 mg total) by mouth 3 (three) times daily as needed. 10/30/17   Seawell, Jaimie A, DO  metFORMIN (GLUCOPHAGE-XR) 500 MG 24 hr tablet TAKE 1 TABLET BY MOUTH EVERY DAY WITH BREAKFAST 08/28/17   Molt, Bethany, DO  montelukast (SINGULAIR) 10 MG tablet Take 1 tablet (10 mg total) by mouth at bedtime. 08/29/17   Molt, Bethany, DO  pantoprazole (PROTONIX) 40 MG tablet Take 1 tablet (40 mg total) by mouth daily. 09/01/17  Molt, Bethany, DO  phenol (CHLORASEPTIC) 1.4 % LIQD Use as directed 2 sprays in the mouth or throat as needed for throat irritation / pain. 10/30/17   Seawell, Jaimie A, DO  PROAIR HFA 108 (90 Base) MCG/ACT inhaler TAKE 2 PUFFS BY MOUTH EVERY 6 HOURS AS NEEDED FOR WHEEZE OR SHORTNESS OF BREATH 10/31/17   Narendra, Nischal, MD  RESTASIS 0.05 % ophthalmic emulsion PLACE 1 DROP INTO BOTH EYES 2 (TWO) TIMES DAILY. USE ONCE DAILY 08/29/17   Molt, Bethany, DO  RESTASIS 0.05 % ophthalmic emulsion PLACE 1 DROP INTO BOTH EYES 2 (TWO) TIMES DAILY. USE ONCE DAILY  11/15/17   Gust Rung, DO  rosuvastatin (CRESTOR) 20 MG tablet Take 1 tablet (20 mg total) by mouth daily. 06/08/17   Angelita Ingles, MD  warfarin (COUMADIN) 4 MG tablet TAKE 1 TAB BY MOUTH DAILY AT 6PM ON MON, WED. AND FRIDAYS. ON ALL OTHER DAYS, TAKE ONLY 1/2 TABLET. 10/25/17   Earl Lagos, MD   Past Medical History:  Diagnosis Date  . Arthritis   . Asthma   . Bronchitis   . CHF (congestive heart failure) (HCC)   . DVT, lower extremity, recurrent (HCC)    Patient had unprovoked PE on 2002 and DVT in right lower extremety 2008.  [name] GERD (gastroesophageal reflux disease)   . History of kidney stones   . Hypertension   . PE (pulmonary embolism)    Patient had unprovoked PE on 2002  . PONV (postoperative nausea and vomiting)   . Vertigo    Social History   Socioeconomic History  . Marital status: Divorced    Spouse name: Not on file  . Number of children: 5  . Years of education: 9 th grade  . Highest education level: 9th grade  Occupational History  . Occupation: Merchandiser, retail  . Financial resource strain: Somewhat hard  . Food insecurity:    Worry: Never true    Inability: Sometimes true  . Transportation needs:    Medical: No    Non-medical: Yes  Tobacco Use  . Smoking status: Former Smoker    Types: Cigarettes    Last attempt to quit: 09/18/1988    Years since quitting: 29.1  . Smokeless tobacco: Never Used  . Tobacco comment: 6 pack year smoking history as a teen  Substance and Sexual Activity  . Alcohol use: No  . Drug use: No  . Sexual activity: Never    Birth control/protection: None  Lifestyle  . Physical activity:    Days per week: 2 days    Minutes per session: 20 min  . Stress: To some extent  Relationships  . Social connections:    Talks on phone: More than three times a week    Gets together: Once a week    Attends religious service: More than 4 times per year    Active member of club or organization: No    Attends meetings of  clubs or organizations: Never    Relationship status: Divorced  Other Topics Concern  . Not on file  Social History Narrative   Current Social History 05/17/2017        Patient lives with 25 yo granddaughter" in a/an home / condo / townhome which is 1 story/stories. There are not steps up to the entrance the patient uses.       Patient's method of transportation is church member.      The highest level of education was some high  school.      The patient currently retired.      Identified important Relationships are God, family       Pets : 1 lab/pitt mix named Cinnamon       Interests / Fun: Church,TV       Current Stressors: "Me and my granddaughter"       Religious / Personal Beliefs: I'm Holiness and I love people"       Other: "I'd give away my last dime."    Family History  Problem Relation Age of Onset  . Cancer Mother   . Hypertension Sister   . Diabetes Sister   . Hypertension Brother   . Diabetes Brother   . Hypertension Sister   . Diabetes Sister   . Colon cancer Other   . Heart Problems Other 34       sister's child, open heart surgery  . Stroke Father   . Diabetes Son   . Hypertension Son   . Kidney disease Son        on dialysis  . Hypertension Sister   . Diabetes Sister   . Hypertension Sister   . Diabetes Sister   . Cancer Brother   . Hypertension Son   . Diabetes Son   . Hypertension Daughter   . Schizophrenia Daughter     ASSESSMENT Recent Results: The most recent result is correlated with 14 mg per week: Lab Results  Component Value Date   INR 2.4 11/20/2017   INR 2.7 11/06/2017   INR 2.2 10/23/2017    Anticoagulation Dosing: Description   Take only one-half (1/2) of your 4mg  blue-colored warfarin tablets by mouth, once-daily at [facility] each day.      INR today: Therapeutic  PLAN Weekly dose was unchanged   Patient Instructions  Patient instructed to take medications as defined in the Anti-coagulation Track section of this  encounter.  Patient instructed to take today's dose.  Patient instructed to take only one-half (1/2) of their 4mg  blue-colored warfarin tablets by mouth, once-daily at [facility] each day.  Patient verbalized understanding of these instructions.     Patient advised to contact clinic or seek medical attention if signs/symptoms of bleeding or thromboembolism occur.  Patient verbalized understanding by repeating back information and was advised to contact me if further medication-related questions arise. Patient was also provided an information handout.  Follow-up Return in about 3 weeks (around 12/11/2017) for INR follow-up at 1030.  Lenward Chancellor, PharmD PGY1 Pharmacy Resident Phone [phone] 11/20/2017 10:57 AM  15 minutes spent face-to-face with the patient during the encounter. 50% of time spent on education. 50% of time was spent on INR sample collection, results determination, documentation in [facility] and [email or web address].

## 2017-11-20 NOTE — Patient Instructions (Signed)
Patient instructed to take medications as defined in the Anti-coagulation Track section of this encounter.  Patient instructed to take today's dose.  Patient instructed to take only one-half (1/2) of their 4mg  blue-colored warfarin tablets by mouth, once-daily at [facility] each day.  Patient verbalized understanding of these instructions.

## 2017-11-22 ENCOUNTER — Other Ambulatory Visit: Payer: Self-pay | Admitting: Cardiology

## 2017-11-22 MED ORDER — MAGNESIUM OXIDE 400 MG PO CAPS: 1.0000 | ORAL_CAPSULE | Freq: Two times a day (BID) | ORAL | 1 refills | Status: DC

## 2017-11-23 NOTE — Progress Notes (Signed)
INTERNAL MEDICINE TEACHING ATTENDING ADDENDUM - Sharnese Heath M.D  Duration- indefinite, Indication- recurrent VTE, INR- therapeutic. Agree with pharmacy recommendations as outlined in their note.     

## 2017-11-25 ENCOUNTER — Other Ambulatory Visit: Payer: Self-pay | Admitting: Cardiology

## 2017-11-27 NOTE — Telephone Encounter (Signed)
Outpatient Medication Detail    Disp Refills Start End   Magnesium Oxide 400 MG CAPS 60 capsule 1 11/22/2017    Sig - Route: Take 1 capsule (400 mg total) by mouth 2 (two) times daily. Please make appt with Dr. Elberta Fortis for January before anymore refills.1st attempt - Oral   Sent to pharmacy as: Magnesium Oxide 400 MG Cap   Notes to Pharmacy: Please call our office to schedule an overdue yearly appointment with Dr. Elberta Fortis for January before anymore refills. [phone]. Thank you 1st attempt   E-Prescribing Status: Receipt confirmed by pharmacy (11/22/2017 11:23 AM EST)   Pharmacy   [id number] - [place], [place] - [address]

## 2017-11-28 ENCOUNTER — Ambulatory Visit: Payer: Medicare Other | Admitting: Dietician

## 2017-11-28 ENCOUNTER — Encounter: Payer: Medicare Other | Admitting: Internal Medicine

## 2017-11-28 ENCOUNTER — Encounter: Payer: Medicare Other | Admitting: Dietician

## 2017-11-28 ENCOUNTER — Encounter: Payer: Self-pay | Admitting: Dietician

## 2017-11-28 NOTE — Progress Notes (Signed)
Diabetes Self-Management Education  Visit Type: First/Initial  Appt. Start Time: 1420 Appt. End Time: 1520  11/28/2017  [name], identified by name and date of birth, is a 72 y.o. female with a diagnosis of Diabetes: Type 2.   ASSESSMENT  Weight 279 lb 3.2 oz (126.6 kg). Body mass index is 49.46 kg/m.   Wt Readings from Last 5 Encounters:  11/28/17 279 lb 3.2 oz (126.6 kg)  11/06/17 280 lb 14.4 oz (127.4 kg)  10/30/17 285 lb (129.3 kg)  09/13/17 287 lb 3.2 oz (130.3 kg)  08/29/17 284 lb 6.4 oz (129 kg)    Lab Results  Component Value Date   HGBA1C 6.9 (A) 08/29/2017    Diabetes Self-Management Education - 11/28/17 1500      Visit Information   Visit Type  First/Initial      Initial Visit   Diabetes Type  Type 2    Are you taking your medications as prescribed?  Yes    Date Diagnosed  --   spring 2019     Psychosocial Assessment   Patient Belief/Attitude about Diabetes  Motivated to manage diabetes    Self-care barriers  Unsteady gait/risk for falls;Lack of material resources    Self-management support  Doctor's office;Family;CDE visits    Other persons present  Friend    Patient Concerns  Nutrition/Meal planning    Special Needs  Simplified materials    Preferred Learning Style  No preference indicated    Learning Readiness  Ready    How often do you need to have someone help you when you read instructions, pamphlets, or other written materials from your doctor or pharmacy?  3 - Sometimes    What is the last grade level you completed in school?  9th      Pre-Education Assessment   Patient understands the diabetes disease and treatment process.  Needs Instruction    Patient understands incorporating nutritional management into lifestyle.  Needs Instruction    Patient understands monitoring blood glucose, interpreting and using results  Needs Instruction    Patient understands prevention, detection, and treatment of acute complications.  Needs  Instruction    Patient understands prevention, detection, and treatment of chronic complications.  Needs Instruction      Complications   Last HgB A1C per patient/outside source  6.9 %    How often do you check your blood sugar?  0 times/day (not testing)    Have you had a dilated eye exam in the past 12 months?  No    Have you had a dental exam in the past 12 months?  No    Are you checking your feet?  No      Dietary Intake   Breakfast  applesauce, cheerios, 2% lactose free milk    Snack (morning)  peanut butter crackers, water    Lunch  turley sandwich, fruit    Snack (afternoon)  peanut butter crackers, fruit,     Dinner  7-8 PM [place] sandwich, mcDs buttermilk chicken sandwich    has pintos,black eyed peas,limits greens due coumadin   Snack (evening)  fruit, rasin bread    Beverage(s)  5-6 bottles of water/day, ginger ale, sprite, pink lemonade      Exercise   Exercise Type  ADL's      Patient Education   Previous Diabetes Education  No   does not want to go to diabetes classes,limited mobility   Nutrition management   Other (comment)   used plate method  to encourage more veggeis, less carbs   Monitoring  Identified appropriate SMBG and/or A1C goals.      Individualized Goals (developed by patient)   Nutrition  General guidelines for healthy choices and portions discussed;Other (comment)   more protein at breakfast     Outcomes   Expected Outcomes  Demonstrated interest in learning. Expect positive outcomes    Future DMSE  2 wks    Program Status  Not Completed       Individualized Plan for Diabetes Self-Management Training:   Learning Objective:  Patient will have a greater understanding of diabetes self-management. Patient education plan is to attend individual and/or group sessions per assessed needs and concerns.   Plan:   Patient Instructions  Thank you for coming today!     Work on getting something with protein for breakfast   can eat Yogurt, brown  bread, [place], egg, lactose free milk, peanut butter, cheese, pinto beans.   I will let you know if you can get a new A1C done o December 11, 2017- the same day you see Dr. Vonna Kotyk.   [name] [phone]    Expected Outcomes:  Demonstrated interest in learning. Expect positive outcomes Education material provided: My Plate If problems or questions, patient to contact team via:  Phone Future DSME appointment: 2 wks  [name], RD 11/28/2017 3:53 PM.

## 2017-11-28 NOTE — Patient Instructions (Addendum)
Thank you for coming today!     Work on getting something with protein for breakfast   can eat Yogurt, brown bread, [place], egg, lactose free milk, peanut butter, cheese, pinto beans.   I will let you know if you can get a new A1C done o December 11, 2017- the same day you see Dr. Vonna Kotyk.   [name] [phone]

## 2017-11-29 ENCOUNTER — Encounter: Payer: Self-pay | Admitting: Dietician

## 2017-12-11 ENCOUNTER — Ambulatory Visit (INDEPENDENT_AMBULATORY_CARE_PROVIDER_SITE_OTHER): Payer: Medicare Other | Admitting: Dietician

## 2017-12-11 ENCOUNTER — Ambulatory Visit (INDEPENDENT_AMBULATORY_CARE_PROVIDER_SITE_OTHER): Payer: Medicare Other | Admitting: Pharmacist

## 2017-12-11 ENCOUNTER — Encounter: Payer: Self-pay | Admitting: Dietician

## 2017-12-11 VITALS — Wt 279.6 lb

## 2017-12-11 DIAGNOSIS — Z7901 Long term (current) use of anticoagulants: Secondary | ICD-10-CM | POA: Diagnosis not present

## 2017-12-11 DIAGNOSIS — Z5181 Encounter for therapeutic drug level monitoring: Secondary | ICD-10-CM

## 2017-12-11 DIAGNOSIS — Z86718 Personal history of other venous thrombosis and embolism: Secondary | ICD-10-CM | POA: Diagnosis not present

## 2017-12-11 DIAGNOSIS — Z713 Dietary counseling and surveillance: Secondary | ICD-10-CM

## 2017-12-11 DIAGNOSIS — E118 Type 2 diabetes mellitus with unspecified complications: Secondary | ICD-10-CM | POA: Diagnosis not present

## 2017-12-11 DIAGNOSIS — I82409 Acute embolism and thrombosis of unspecified deep veins of unspecified lower extremity: Secondary | ICD-10-CM

## 2017-12-11 DIAGNOSIS — Z6841 Body Mass Index (BMI) 40.0 and over, adult: Secondary | ICD-10-CM

## 2017-12-11 LAB — GLUCOSE, CAPILLARY: Glucose-Capillary: 115 mg/dL — ABNORMAL HIGH (ref 70–99)

## 2017-12-11 LAB — POCT GLYCOSYLATED HEMOGLOBIN (HGB A1C): Hemoglobin A1C: 6.6 % — AB (ref 4.0–5.6)

## 2017-12-11 LAB — POCT INR: INR: 1.7 — AB (ref 2.0–3.0)

## 2017-12-11 NOTE — Progress Notes (Signed)
Diabetes Self-Management Education  Visit Type:  Follow-up  Appt. Start Time: 10:45 Appt. End Time: 11:30  12/11/2017  [name], identified by name and date of birth, is a 72 y.o. female with a diagnosis of Type 2 Diabetes:  [name] Her A1c has improved from 6.9 to 6.6 over the last three months.   ASSESSMENT  Weight 279 lb 9.6 oz (126.8 kg). Body mass index is 49.53 kg/m.   Diabetes Self-Management Education - 12/11/17 1000      Health Coping   How would you rate your overall health?  Good      Psychosocial Assessment   Patient Belief/Attitude about Diabetes  Motivated to manage diabetes      Pre-Education Assessment   Patient undertands incorporating physical activity into lifestyle.  Needs Instruction      Complications   Last HgB A1C per patient/outside source  --   6.6     Dietary Intake   Breakfast  --   applesauce, pb and brown crackers, water   Lunch  --   [place] sandwich, 2 slices brown bread, fruit, water   Dinner  --   baked fish (sometimes fried), and baked apples or mac n chz   Beverage(s)  --   water; occasional sprite or pink lemonade     Exercise   Exercise Type  Light (walking / raking leaves)    How many days per week to you exercise?  --   2-3      Patient Education   Previous Diabetes Education  Yes (please comment)   here   Disease state   Explored patient's options for treatment of their diabetes    Nutrition management   Role of diet in the treatment of diabetes and the relationship between the three main macronutrients and blood glucose level    Physical activity and exercise   Role of exercise on diabetes management, blood pressure control and cardiac health.    Monitoring  Interpreting lab values - A1C, lipid, urine microalbumina.    Acute complications  Discussed and identified patients' treatment of hyperglycemia.      Individualized Goals (developed by patient)   Nutrition  General guidelines for healthy choices and portions  discussed    Physical Activity  Exercise 3-5 times per week      Patient Self-Evaluation of Goals - Patient rates self as meeting previously set goals (% of time)   Nutrition  >75%      Outcomes   Program Status  Not Completed      Subsequent Visit   Since your last visit have you continued or begun to take your medications as prescribed?  Yes    Since your last visit have you experienced any weight changes?  No change    Since your last visit, are you checking your blood glucose at least once a day?  N/A       Learning Objective:  Patient will have a greater understanding of diabetes self-management. Patient education plan is to attend individual and/or group sessions per assessed needs and concerns.   Plan:   Patient Instructions  Your A1C is lower today than it was 3 months ago- it went from 6.9%( )  to 6.6%  TWO THINGS I WANT TO DO TO LOWER MY BLOOD SUGAR FOR THE NEXT   BAKED FISH INSTEAD OF FRIED FISH WALK MORE- TRY TO MAKE SURE I WALK EVERY OTHER DAY FOR ABOUT 20-30   Caroline [name] [phone]    Expected Outcomes:  Demonstrated interest in learning. Expect positive outcomes  Education material provided: hyperglycemia symptoms handout, avs  If problems or questions, patient to contact team via:  Phone  Future DSME appointment: - 2 wks   Debera Lat, RD 12/11/2017 11:55 AM.

## 2017-12-11 NOTE — Patient Instructions (Addendum)
Your A1C is lower today than it was 3 months ago- it went from 6.9%( )  to 6.6%  TWO THINGS I WANT TO DO TO LOWER MY BLOOD SUGAR FOR THE NEXT   BAKED FISH INSTEAD OF FRIED FISH WALK MORE- TRY TO MAKE SURE I WALK EVERY OTHER DAY FOR ABOUT 20-30   Caroline [name] [phone]

## 2017-12-11 NOTE — Patient Instructions (Signed)
Patient instructed to take medications as defined in the Anti-coagulation Track section of this encounter.  Patient instructed to take today's dose.  Patient instructed to take only one-half (1/2) of your 4mg blue-colored warfarin tablets by mouth, once-daily at 6PM each day--EXCEPT on  MONDAYS andTHURSDAYS, take one (1) tablet of your 4mg blue-colored warfarin tablets on MONDAYS and THURSDAYS. Patient verbalized understanding of these instructions.    

## 2017-12-11 NOTE — Progress Notes (Signed)
Anticoagulation Management Caroline [name] is a 72 y.o. female who reports to the clinic for monitoring of warfarin treatment.    Indication: DVT, history of; long term current use of anticoagulation.   Duration: indefinite Supervising physician: Earl Lagos  Anticoagulation Clinic Visit History: Patient does not report signs/symptoms of bleeding or thromboembolism  Other recent changes: No diet, medications, lifestyle changes.  Anticoagulation Episode Summary    Current INR goal:   2.0-3.0  TTR:   81.5 % (2.9 y)  Next INR check:   12/25/2017  INR from last check:   1.7! 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 Penicillins Anaphylaxis, Hives, Swelling and Rash    Has patient had a PCN reaction causing immediate rash, facial/tongue/throat swelling, SOB or lightheadedness with hypotension: Yes Has patient had a PCN reaction causing severe rash involving mucus membranes or skin necrosis: Yes Has patient had a PCN reaction that required hospitalization Yes Has patient had a PCN reaction occurring within the last 10 years: No If all of the above answers are "NO", then may proceed with Cephalosporin use.   . Cabbage Itching  . Keflex [Cephalexin] Hives    And feeling of throat tightness  . Shellfish Allergy Swelling  . Tomato Swelling  . Latex Itching and Rash   Prior to Admission medications   Medication Sig Start Date End Date Taking? Authorizing Provider  budesonide-formoterol (SYMBICORT) 160-4.5 MCG/ACT inhaler Inhale 2 puffs into the lungs 2 (two) times daily. 08/29/17  Yes Molt, Bethany, DO  calcium citrate-vitamin D (CITRACAL+D) 315-200 MG-UNIT tablet Take 2 tablets daily  by mouth. 11/14/16  Yes [name], MD  famotidine (PEPCID) 20 MG tablet Take 1 tablet (20 mg total) by mouth 2 (two) times daily. 11/08/17  Yes Earl Lagos, MD  fluticasone (FLONASE) 50 MCG/ACT nasal spray PLACE 2 SPRAYS DAILY INTO BOTH NOSTRILS. 11/20/17  Yes Earl Lagos, MD  furosemide (LASIX) 40 MG tablet Take 1 tablet (40 mg total) by mouth every other day. 11/29/16  Yes Molt, Bethany, DO  hydrocortisone (PROCTOSOL HC) 2.5 % rectal cream Place 1 application rectally 2 (two) times daily. 10/26/17  Yes Earl Lagos, MD  losartan (COZAAR) 100 MG tablet Take 1 tablet (100 mg total) by mouth daily. 08/29/17  Yes Molt, Bethany, DO  Magnesium Oxide 400 MG CAPS Take 1 capsule (400 mg total) by mouth 2 (two) times daily. Please make appt with Dr. Elberta Fortis for January before anymore refills.1st attempt 11/22/17  Yes Camnitz, Andree Coss, MD  meclizine (ANTIVERT) 25 MG tablet Take 1 tablet (25 mg total) by mouth 3 (three) times daily as needed. 10/30/17  Yes Seawell, Jaimie A, DO  metFORMIN (GLUCOPHAGE-XR) 500 MG 24 hr tablet TAKE 1 TABLET BY MOUTH EVERY DAY WITH BREAKFAST 08/28/17  Yes Molt, Bethany, DO  montelukast (SINGULAIR) 10 MG tablet Take 1 tablet (10 mg total) by mouth at bedtime. 08/29/17  Yes Molt, Bethany, DO  pantoprazole (PROTONIX) 40 MG tablet Take 1 tablet (40 mg total) by mouth daily. 09/01/17  Yes Molt, Bethany, DO  phenol (CHLORASEPTIC) 1.4 % LIQD Use as directed  2 sprays in the mouth or throat as needed for throat irritation / pain. 10/30/17  Yes Seawell, Jaimie A, DO  PROAIR HFA 108 (90 Base) MCG/ACT inhaler TAKE 2 PUFFS BY MOUTH EVERY 6 HOURS AS NEEDED FOR WHEEZE OR SHORTNESS OF BREATH 10/31/17  Yes Narendra, Nischal, MD  RESTASIS 0.05 % ophthalmic emulsion PLACE 1 DROP INTO BOTH EYES 2 (TWO) TIMES DAILY. USE ONCE DAILY 08/29/17  Yes Molt, Bethany, DO  RESTASIS 0.05 % ophthalmic emulsion PLACE 1 DROP INTO BOTH EYES 2 (TWO) TIMES DAILY. USE ONCE DAILY 11/15/17  Yes Gust Rung, DO  rosuvastatin (CRESTOR) 20 MG tablet Take 1 tablet (20 mg total) by mouth daily. 06/08/17  Yes Angelita Ingles, MD  warfarin (COUMADIN) 4 MG tablet TAKE 1 TAB BY MOUTH DAILY AT 6PM ON MON, WED. AND FRIDAYS. ON ALL OTHER DAYS, TAKE ONLY 1/2 TABLET. 10/25/17  Yes Narendra, Nischal, MD  carvedilol (COREG) 12.5 MG tablet TAKE 1 TABLET (12.5 MG TOTAL) BY MOUTH 2 (TWO) TIMES DAILY. 08/03/17 11/01/17  Regan Lemming, MD   Past Medical History:  Diagnosis Date  . Arthritis   . Asthma   . Bronchitis   . CHF (congestive heart failure) (HCC)   . Diabetes (HCC) 2019  . DVT, lower extremity, recurrent (HCC)    Patient had unprovoked PE on 2002 and DVT in right lower extremety 2008.  [name] GERD (gastroesophageal reflux disease)   . History of kidney stones   . Hypertension   . PE (pulmonary embolism)    Patient had unprovoked PE on 2002  . PONV (postoperative nausea and vomiting)   . Vertigo    Social History   Socioeconomic History  . Marital status: Divorced    Spouse name: Not on file  . Number of children: 5  . Years of education: 9 th grade  . Highest education level: 9th grade  Occupational History  . Occupation: Merchandiser, retail  . Financial resource strain: Somewhat hard  . Food insecurity:    Worry: Never true    Inability: Sometimes true  . Transportation needs:    Medical: No    Non-medical: Yes  Tobacco Use  . Smoking status: Former Smoker    Types: Cigarettes    Last attempt to quit: 09/18/1988    Years since quitting: 29.2  . Smokeless tobacco: Never Used  . Tobacco comment: 6 pack year smoking history as a teen  Substance and Sexual Activity  . Alcohol use: No  . Drug use: No  . Sexual activity: Never    Birth control/protection: None  Lifestyle  . Physical activity:    Days per week: 2 days    Minutes per session: 20 min  . Stress: To some extent  Relationships  . Social connections:    Talks on phone: More than three times a week    Gets  together: Once a week    Attends religious service: More than 4 times per year    Active member of club or organization: No    Attends meetings of clubs or organizations: Never    Relationship status: Divorced  Other Topics Concern  . Not on file  Social History Narrative   Current Social History 05/17/2017        Patient lives with 92 yo granddaughter" in a/an home / condo / townhome which is 1 story/stories. There are not steps up to the entrance the patient uses.       Patient's method  of transportation is church member.      The highest level of education was some high school.      The patient currently retired.      Identified important Relationships are God, family       Pets : 1 lab/pitt mix named Cinnamon       Interests / Fun: Church,TV       Current Stressors: "Me and my granddaughter"       Religious / Personal Beliefs: I'm Holiness and I love people"       Other: "I'd give away my last dime."    Family History  Problem Relation Age of Onset  . Cancer Mother   . Hypertension Sister   . Diabetes Sister   . Hypertension Brother   . Diabetes Brother   . Hypertension Sister   . Diabetes Sister   . Colon cancer Other   . Heart Problems Other 34       sister's child, open heart surgery  . Stroke Father   . Diabetes Son   . Hypertension Son   . Kidney disease Son        on dialysis  . Hypertension Sister   . Diabetes Sister   . Hypertension Sister   . Diabetes Sister   . Cancer Brother   . Hypertension Son   . Diabetes Son   . Hypertension Daughter   . Schizophrenia Daughter     ASSESSMENT Recent Results: The most recent result is correlated with 14 mg per week: Lab Results  Component Value Date   INR 1.7 (A) 12/11/2017   INR 2.4 11/20/2017   INR 2.7 11/06/2017    Anticoagulation Dosing: Description   Take only one-half (1/2) of your 4mg  blue-colored warfarin tablets by mouth, once-daily at 6PM each day--EXCEPT on MONDAYS and THURSDAYS, take one  (1) tablet of your 4mg  blue-colored warfarin tablets on MONDAYS and THURSDAYS.      INR today: Subtherapeutic  PLAN Weekly dose was increased by 20% to 18 mg per week  Patient Instructions  Patient instructed to take medications as defined in the Anti-coagulation Track section of this encounter.  Patient instructed to take today's dose. Patient instructed to take  only one-half (1/2) of your 4mg  blue-colored warfarin tablets by mouth, once-daily at 6PM each day--EXCEPT on MONDAYS and THURSDAYS, take one (1) tablet of your 4mg  blue-colored warfarin tablets on MONDAYS and THURSDAYS.  Patient verbalized understanding of these instructions.     Patient advised to contact clinic or seek medical attention if signs/symptoms of bleeding or thromboembolism occur.  Patient verbalized understanding by repeating back information and was advised to contact me if further medication-related questions arise. Patient was also provided an information handout.  Follow-up Return in 2 weeks (on 12/25/2017) for Follow up INR at 1000h.  Elicia Lamp, PharmD, CPP  15 minutes spent face-to-face with the patient during the encounter. 50% of time spent on education. 50% of time was spent on fingerstick point of care INR sample collection, processing, results determination and dose adjustment and documentation in [email or web address].

## 2017-12-12 ENCOUNTER — Telehealth: Payer: Self-pay | Admitting: Licensed Clinical Social Worker

## 2017-12-12 NOTE — Telephone Encounter (Signed)
Patient was contacted due to missing her initial appointment for counseling services. Patient requested to be scheduled for a joint visit in January, 2020.

## 2017-12-13 NOTE — Progress Notes (Signed)
INTERNAL MEDICINE TEACHING ATTENDING ADDENDUM - Jaena Brocato M.D  Duration- indefinite, Indication- recurrent VTE, INR- sub therapeutic. Agree with pharmacy recommendations as outlined in their note.     

## 2017-12-14 ENCOUNTER — Other Ambulatory Visit: Payer: Self-pay | Admitting: *Deleted

## 2017-12-14 DIAGNOSIS — Z6841 Body Mass Index (BMI) 40.0 and over, adult: Principal | ICD-10-CM

## 2017-12-14 MED ORDER — CALCIUM CITRATE-VITAMIN D 315-200 MG-UNIT PO TABS: 2.0000 | ORAL_TABLET | Freq: Every day | ORAL | 3 refills | Status: DC

## 2017-12-14 NOTE — Telephone Encounter (Signed)
rec'd faxed refill request, has been refilled on fluticasone

## 2017-12-15 ENCOUNTER — Other Ambulatory Visit: Payer: Self-pay | Admitting: Internal Medicine

## 2017-12-15 ENCOUNTER — Other Ambulatory Visit: Payer: Self-pay | Admitting: *Deleted

## 2017-12-15 DIAGNOSIS — Z6841 Body Mass Index (BMI) 40.0 and over, adult: Principal | ICD-10-CM

## 2017-12-15 DIAGNOSIS — I5042 Chronic combined systolic (congestive) and diastolic (congestive) heart failure: Secondary | ICD-10-CM

## 2017-12-15 MED ORDER — FUROSEMIDE 40 MG PO TABS: 40.0000 mg | ORAL_TABLET | ORAL | 1 refills | Status: DC

## 2017-12-18 ENCOUNTER — Other Ambulatory Visit: Payer: Self-pay | Admitting: Cardiology

## 2017-12-19 ENCOUNTER — Ambulatory Visit: Payer: Medicare Other | Admitting: Internal Medicine

## 2017-12-25 ENCOUNTER — Ambulatory Visit (INDEPENDENT_AMBULATORY_CARE_PROVIDER_SITE_OTHER): Payer: Medicare Other | Admitting: Pharmacist

## 2017-12-25 ENCOUNTER — Encounter: Payer: Self-pay | Admitting: Dietician

## 2017-12-25 ENCOUNTER — Encounter: Payer: Self-pay | Admitting: Pharmacist

## 2017-12-25 ENCOUNTER — Ambulatory Visit (INDEPENDENT_AMBULATORY_CARE_PROVIDER_SITE_OTHER): Payer: Medicare Other | Admitting: Dietician

## 2017-12-25 DIAGNOSIS — Z7901 Long term (current) use of anticoagulants: Secondary | ICD-10-CM | POA: Diagnosis not present

## 2017-12-25 DIAGNOSIS — Z5181 Encounter for therapeutic drug level monitoring: Secondary | ICD-10-CM

## 2017-12-25 DIAGNOSIS — Z86718 Personal history of other venous thrombosis and embolism: Secondary | ICD-10-CM | POA: Diagnosis not present

## 2017-12-25 DIAGNOSIS — I82409 Acute embolism and thrombosis of unspecified deep veins of unspecified lower extremity: Secondary | ICD-10-CM

## 2017-12-25 DIAGNOSIS — Z713 Dietary counseling and surveillance: Secondary | ICD-10-CM | POA: Diagnosis not present

## 2017-12-25 DIAGNOSIS — E118 Type 2 diabetes mellitus with unspecified complications: Secondary | ICD-10-CM

## 2017-12-25 LAB — POCT INR: INR: 2.8 (ref 2.0–3.0)

## 2017-12-25 NOTE — Patient Instructions (Signed)
Patient instructed to take medications as defined in the Anti-coagulation Track section of this encounter.  Patient instructed to take today's dose.  Patient instructed to take only one-half (1/2) of your 4mg  blue-colored warfarin tablets by mouth, once-daily at [facility] each day--EXCEPT on THURSDAYS, take one (1) tablet of your 4mg  blue-colored warfarin tablets on THURSDAYS. Patient verbalized understanding of these instructions.

## 2017-12-25 NOTE — Progress Notes (Signed)
Anticoagulation Management Caroline [name] is a 72 y.o. female who reports to the clinic for monitoring of warfarin treatment.    Indication: DVT, lower extremity, recurrent (HCC), Pulmonary Embolism, History of (Resolved), long term current use of anticoagulant.    Duration: indefinite Supervising physician: Carlynn Purl  Anticoagulation Clinic Visit History: Patient does not report signs/symptoms of bleeding or thromboembolism  Other recent changes: No diet, medications, lifestyle changes.  Anticoagulation Episode Summary    Current INR goal:   2.0-3.0  TTR:   81.4 % (2.9 y)  Next INR check:   01/15/2018  INR from last check:   2.8 (12/25/2017)  Weekly max warfarin dose:     Target end date:     INR check location:   Anticoagulation Clinic  Preferred lab:     Send INR reminders to:   [facility]   Indications   DVT lower extremity recurrent (HCC) [I82.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 Penicillins Anaphylaxis, Hives, Swelling and Rash    Has patient had a PCN reaction causing immediate rash, facial/tongue/throat swelling, SOB or lightheadedness with hypotension: Yes Has patient had a PCN reaction causing severe rash involving mucus membranes or skin necrosis: Yes Has patient had a PCN reaction that required hospitalization Yes Has patient had a PCN reaction occurring within the last 10 years: No If all of the above answers are "NO", then may proceed with Cephalosporin use.   . Cabbage Itching  . Keflex [Cephalexin] Hives    And feeling of throat tightness  . Shellfish Allergy Swelling  . Tomato Swelling  . Latex Itching and Rash   Prior to Admission medications   Medication Sig Start Date End Date Taking? Authorizing Provider  budesonide-formoterol (SYMBICORT) 160-4.5 MCG/ACT inhaler Inhale 2 puffs into the lungs 2 (two) times daily. 08/29/17  Yes Molt, Bethany, DO  calcium citrate-vitamin D  (CITRACAL+D) 315-200 MG-UNIT tablet Take 2 tablets by mouth daily. 12/14/17  Yes Earl Lagos, MD  famotidine (PEPCID) 20 MG tablet Take 1 tablet (20 mg total) by mouth 2 (two) times daily. 11/08/17  Yes Earl Lagos, MD  fluticasone (FLONASE) 50 MCG/ACT nasal spray PLACE 2 SPRAYS DAILY INTO BOTH NOSTRILS. 11/20/17  Yes Earl Lagos, MD  furosemide (LASIX) 40 MG tablet Take 1 tablet (40 mg total) by mouth every other day. 12/15/17  Yes Earl Lagos, MD  hydrocortisone (PROCTOSOL HC) 2.5 % rectal cream Place 1 application rectally 2 (two) times daily. 10/26/17  Yes Earl Lagos, MD  losartan (COZAAR) 100 MG tablet Take 1 tablet (100 mg total) by mouth daily. 08/29/17  Yes Molt, Bethany, DO  Magnesium Oxide 400 MG CAPS Take 1 capsule (400 mg total) by mouth 2 (two) times daily. Please make appt with Dr. Elberta Fortis for January before anymore refills.1st attempt 11/22/17  Yes Camnitz, Andree Coss, MD  meclizine (ANTIVERT) 25 MG tablet Take 1 tablet (25 mg total) by mouth 3 (three) times daily as needed. 10/30/17  Yes Seawell, Jaimie A, DO  metFORMIN (GLUCOPHAGE-XR) 500 MG 24 hr tablet TAKE 1 TABLET BY MOUTH EVERY DAY WITH BREAKFAST 08/28/17  Yes Molt, Bethany, DO  montelukast (SINGULAIR) 10 MG tablet Take 1 tablet (10 mg total) by mouth at bedtime. 08/29/17  Yes Molt, Bethany, DO  pantoprazole (PROTONIX) 40 MG tablet Take 1 tablet (40 mg total) by mouth daily. 09/01/17  Yes Molt, Bethany, DO  phenol (CHLORASEPTIC) 1.4 % LIQD Use as directed 2 sprays in the mouth or throat as needed for throat irritation / pain. 10/30/17  Yes Seawell, Jaimie A, DO  PROAIR HFA 108 (90 Base) MCG/ACT inhaler TAKE 2 PUFFS BY MOUTH EVERY 6 HOURS AS NEEDED FOR WHEEZE OR SHORTNESS OF BREATH 10/31/17  Yes Narendra, Nischal, MD  RESTASIS 0.05 % ophthalmic emulsion PLACE 1 DROP INTO BOTH EYES 2 (TWO) TIMES DAILY. USE ONCE DAILY 08/29/17  Yes Molt, Bethany, DO  RESTASIS 0.05 % ophthalmic emulsion PLACE 1 DROP INTO BOTH  EYES 2 (TWO) TIMES DAILY. USE ONCE DAILY 11/15/17  Yes Gust Rung, DO  rosuvastatin (CRESTOR) 20 MG tablet Take 1 tablet (20 mg total) by mouth daily. 06/08/17  Yes Angelita Ingles, MD  warfarin (COUMADIN) 4 MG tablet TAKE 1 TAB BY MOUTH DAILY AT 6PM ON MON, WED. AND FRIDAYS. ON ALL OTHER DAYS, TAKE ONLY 1/2 TABLET. 10/25/17  Yes Narendra, Nischal, MD  carvedilol (COREG) 12.5 MG tablet TAKE 1 TABLET (12.5 MG TOTAL) BY MOUTH 2 (TWO) TIMES DAILY. 08/03/17 11/01/17  Regan Lemming, MD   Past Medical History:  Diagnosis Date  . Arthritis   . Asthma   . Bronchitis   . CHF (congestive heart failure) (HCC)   . Diabetes (HCC) 2019  . DVT, lower extremity, recurrent (HCC)    Patient had unprovoked PE on 2002 and DVT in right lower extremety 2008.  [name] GERD (gastroesophageal reflux disease)   . History of kidney stones   . Hypertension   . PE (pulmonary embolism)    Patient had unprovoked PE on 2002  . PONV (postoperative nausea and vomiting)   . Vertigo    Social History   Socioeconomic History  . Marital status: Divorced    Spouse name: Not on file  . Number of children: 5  . Years of education: 9 th grade  . Highest education level: 9th grade  Occupational History  . Occupation: Merchandiser, retail  . Financial resource strain: Somewhat hard  . Food insecurity:    Worry: Never true    Inability: Sometimes true  . Transportation needs:    Medical: No    Non-medical: Yes  Tobacco Use  . Smoking status: Former Smoker    Types: Cigarettes    Last attempt to quit: 09/18/1988    Years since quitting: 29.2  . Smokeless tobacco: Never Used  . Tobacco comment: 6 pack year smoking history as a teen  Substance and Sexual Activity  . Alcohol use: No  . Drug use: No  . Sexual activity: Never    Birth control/protection: None  Lifestyle  . Physical activity:    Days per week: 2 days    Minutes per session: 20 min  . Stress: To some extent  Relationships  . Social  connections:    Talks on phone: More than three times a week    Gets together: Once a week    Attends religious service: More than 4 times per year    Active member of club or organization: No    Attends meetings of clubs or organizations: Never    Relationship status: Divorced  Other Topics Concern  . Not on file  Social History Narrative   Current Social History 05/17/2017        Patient lives with 26 yo granddaughter" in a/an home / condo / townhome which is 1 story/stories. There are not steps up to the entrance the patient uses.  Patient's method of transportation is church member.      The highest level of education was some high school.      The patient currently retired.      Identified important Relationships are God, family       Pets : 1 lab/pitt mix named Cinnamon       Interests / Fun: Church,TV       Current Stressors: "Me and my granddaughter"       Religious / Personal Beliefs: I'm Holiness and I love people"       Other: "I'd give away my last dime."    Family History  Problem Relation Age of Onset  . Cancer Mother   . Hypertension Sister   . Diabetes Sister   . Hypertension Brother   . Diabetes Brother   . Hypertension Sister   . Diabetes Sister   . Colon cancer Other   . Heart Problems Other 34       sister's child, open heart surgery  . Stroke Father   . Diabetes Son   . Hypertension Son   . Kidney disease Son        on dialysis  . Hypertension Sister   . Diabetes Sister   . Hypertension Sister   . Diabetes Sister   . Cancer Brother   . Hypertension Son   . Diabetes Son   . Hypertension Daughter   . Schizophrenia Daughter     ASSESSMENT Recent Results: The most recent result is correlated with 18 mg per week: Lab Results  Component Value Date   INR 2.8 12/25/2017   INR 1.7 (A) 12/11/2017   INR 2.4 11/20/2017    Anticoagulation Dosing: Description   Take only one-half (1/2) of your 4mg  blue-colored warfarin tablets by  mouth, once-daily at 6PM each day--EXCEPT on THURSDAYS, take one (1) tablet of your 4mg  blue-colored warfarin tablets on THURSDAYS.      INR today: Therapeutic  PLAN Weekly dose was decreased by 11% to 16 mg per week  Patient Instructions  Patient instructed to take medications as defined in the Anti-coagulation Track section of this encounter.  Patient instructed to take today's dose.  Patient instructed to take only one-half (1/2) of your 4mg  blue-colored warfarin tablets by mouth, once-daily at [facility] each day--EXCEPT on THURSDAYS, take one (1) tablet of your 4mg  blue-colored warfarin tablets on THURSDAYS. Patient verbalized understanding of these instructions.     Patient advised to contact clinic or seek medical attention if signs/symptoms of bleeding or thromboembolism occur.  Patient verbalized understanding by repeating back information and was advised to contact me if further medication-related questions arise. Patient was also provided an information handout.  Follow-up Return in 3 weeks (on 01/15/2018) for Follow up INR at 1015h.  Elicia Lamp, PharmD, CPP  15 minutes spent face-to-face with the patient during the encounter. 50% of time spent on education. 50% of time was spent on fingerstick INR sample collection, processing, results determination, dose adjustment and documentation in [email or web address].

## 2017-12-25 NOTE — Progress Notes (Signed)
Diabetes Self-Management Education  Visit Type:  Follow-up  Appt. Start Time: 1100 Appt. End Time: 1130  12/25/2017  [name], identified by name and date of birth, is a 72 y.o. female with a diagnosis of Diabetes:  [name]  Type 2 ASSESSMENT  She is happy with decreased fluid. We discussed using A1C as a way to self monitor her diabetes control. One of her goals is weight loss, however she did not want to weigh today.    Diabetes Self-Management Education - 12/25/17 1300      Health Coping   How would you rate your overall health?  Good      Dietary Intake   Breakfast  oatmeal    Lunch  subway steak sub on whole grain    Snack (afternoon)  fruit      Exercise   Exercise Type  ADL's;Light (walking / raking leaves)    How many days per week to you exercise?  4    How many minutes per day do you exercise?  30    Total minutes per week of exercise  120      Patient Education   Disease state   Definition of diabetes, type 1 and 2, and the diagnosis of diabetes    Medications  Reviewed patients medication for diabetes, action, purpose, timing of dose and side effects.    Chronic complications  Assessed and discussed foot care and prevention of foot problems;Lipid levels, blood glucose control and heart disease;Retinopathy and reason for yearly dilated eye exams;Nephropathy, what it is, prevention of, the use of ACE, ARB's and early detection of through urine microalbumia.;Reviewed with patient heart disease, higher risk of, and prevention   need to discuss immunizations and importanc of DM control   Personal strategies to promote health  Helped patient develop diabetes management plan for (enter comment)   supporting her diabetes self care     Patient Self-Evaluation of Goals - Patient rates self as meeting previously set goals (% of time)   Physical Activity  50 - 75 %      Outcomes   Program Status  Completed      Subsequent Visit   Since your last visit have you continued  or begun to take your medications as prescribed?  Yes    Since your last visit have you experienced any weight changes?  --   she did not want to weigh today   Since your last visit, are you checking your blood glucose at least once a day?  N/A       Learning Objective:  Patient will have a greater understanding of diabetes self-management. Patient education plan is to attend individual and/or group sessions per assessed needs and concerns.   Plan:   Patient Instructions  Your next appointment  with ne is 930 am on 01/30/17.    You are doing great!  Stick to your exercise 30 minutes every other day.   Daily Vegetables- increase to at least one green vegetables every other day- like Monday Wednesday and Friday.   Salad, green beans, pintos, carrots  [name] [phone]      Expected Outcomes:  Demonstrated interest in learning. Expect positive outcomes  Education material provided: Support group flyer  If problems or questions, patient to contact team via:  Phone  Future DSME appointment: - 4-6 wks  [name], RD 12/25/2017 1:31 PM.

## 2017-12-25 NOTE — Patient Instructions (Addendum)
Your next appointment  with me is 930 am on 01/30/17.    You are doing great!  Stick to your exercise 30 minutes every other day.   Daily Vegetables- increase to at least one green vegetables every other day- like Monday Wednesday and Friday.   Salad, green beans, pintos, carrots  [name] [phone]

## 2018-01-12 ENCOUNTER — Other Ambulatory Visit: Payer: Self-pay | Admitting: Cardiology

## 2018-01-12 NOTE — Telephone Encounter (Signed)
Pt is requesting tablets instead of the gel capsules. Please address. Thank you

## 2018-01-15 ENCOUNTER — Ambulatory Visit (INDEPENDENT_AMBULATORY_CARE_PROVIDER_SITE_OTHER): Payer: Medicare Other | Admitting: Pharmacist

## 2018-01-15 ENCOUNTER — Encounter: Payer: Self-pay | Admitting: Pharmacist

## 2018-01-15 DIAGNOSIS — Z7901 Long term (current) use of anticoagulants: Secondary | ICD-10-CM

## 2018-01-15 DIAGNOSIS — I82409 Acute embolism and thrombosis of unspecified deep veins of unspecified lower extremity: Secondary | ICD-10-CM | POA: Diagnosis not present

## 2018-01-15 DIAGNOSIS — Z86718 Personal history of other venous thrombosis and embolism: Secondary | ICD-10-CM

## 2018-01-15 DIAGNOSIS — Z5181 Encounter for therapeutic drug level monitoring: Secondary | ICD-10-CM | POA: Diagnosis not present

## 2018-01-15 LAB — POCT INR: INR: 1.8 — AB (ref 2.0–3.0)

## 2018-01-15 NOTE — Progress Notes (Signed)
Anticoagulation Management Caroline [name] is a 73 y.o. female who reports to the clinic for monitoring of warfarin treatment.    Indication: DVT, history of recurrent; Long term (current) use of anticoagulant.   Duration: indefinite Supervising physician: Earl Lagos  Anticoagulation Clinic Visit History: Patient does not report signs/symptoms of bleeding or thromboembolism  Other recent changes: No diet, medications, lifestyle changes.  Anticoagulation Episode Summary    Current INR goal:   2.0-3.0  TTR:   81.4 % (2.9 y)  Next INR check:   02/05/2018  INR from last check:   1.8! (01/15/2018)  Weekly max warfarin dose:     Target end date:     INR check location:   Anticoagulation Clinic  Preferred lab:     Send INR reminders to:   [facility] IMP   Indications   DVT lower extremity recurrent (HCC) [I82.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 Penicillins Anaphylaxis, Hives, Swelling and Rash    Has patient had a PCN reaction causing immediate rash, facial/tongue/throat swelling, SOB or lightheadedness with hypotension: Yes Has patient had a PCN reaction causing severe rash involving mucus membranes or skin necrosis: Yes Has patient had a PCN reaction that required hospitalization Yes Has patient had a PCN reaction occurring within the last 10 years: No If all of the above answers are "NO", then may proceed with Cephalosporin use.   . Cabbage Itching  . Keflex [Cephalexin] Hives    And feeling of throat tightness  . Shellfish Allergy Swelling  . Tomato Swelling  . Latex Itching and Rash   Prior to Admission medications   Medication Sig Start Date End Date Taking? Authorizing Provider  budesonide-formoterol (SYMBICORT) 160-4.5 MCG/ACT inhaler Inhale 2 puffs into the lungs 2 (two) times daily. 08/29/17  Yes Molt, Bethany, DO  calcium citrate-vitamin D (CITRACAL+D) 315-200 MG-UNIT tablet Take 2  tablets by mouth daily. 12/14/17  Yes Earl Lagos, MD  famotidine (PEPCID) 20 MG tablet Take 1 tablet (20 mg total) by mouth 2 (two) times daily. 11/08/17  Yes Earl Lagos, MD  fluticasone (FLONASE) 50 MCG/ACT nasal spray PLACE 2 SPRAYS DAILY INTO BOTH NOSTRILS. 11/20/17  Yes Earl Lagos, MD  furosemide (LASIX) 40 MG tablet Take 1 tablet (40 mg total) by mouth every other day. 12/15/17  Yes Earl Lagos, MD  hydrocortisone (PROCTOSOL HC) 2.5 % rectal cream Place 1 application rectally 2 (two) times daily. 10/26/17  Yes Earl Lagos, MD  losartan (COZAAR) 100 MG tablet Take 1 tablet (100 mg total) by mouth daily. 08/29/17  Yes Molt, Bethany, DO  Magnesium Oxide 400 MG CAPS Take 1 capsule (400 mg total) by mouth 2 (two) times daily. Please make appt with Dr. Elberta Fortis for January before anymore refills.1st attempt 11/22/17  Yes Camnitz, Andree Coss, MD  meclizine (ANTIVERT) 25 MG tablet Take 1 tablet (25 mg total) by mouth 3 (three) times daily as needed. 10/30/17  Yes Seawell, Jaimie A, DO  metFORMIN (GLUCOPHAGE-XR) 500 MG 24 hr tablet TAKE 1 TABLET BY MOUTH EVERY DAY WITH BREAKFAST 08/28/17  Yes Molt, Bethany, DO  montelukast (SINGULAIR) 10 MG tablet Take 1 tablet (10 mg total) by mouth at bedtime. 08/29/17  Yes Molt, Bethany, DO  pantoprazole (PROTONIX) 40 MG tablet Take 1 tablet (40 mg total) by mouth daily. 09/01/17  Yes Molt, Bethany, DO  phenol (CHLORASEPTIC) 1.4 % LIQD Use as  directed 2 sprays in the mouth or throat as needed for throat irritation / pain. 10/30/17  Yes Seawell, Jaimie A, DO  PROAIR HFA 108 (90 Base) MCG/ACT inhaler TAKE 2 PUFFS BY MOUTH EVERY 6 HOURS AS NEEDED FOR WHEEZE OR SHORTNESS OF BREATH 10/31/17  Yes Narendra, Nischal, MD  RESTASIS 0.05 % ophthalmic emulsion PLACE 1 DROP INTO BOTH EYES 2 (TWO) TIMES DAILY. USE ONCE DAILY 08/29/17  Yes Molt, Bethany, DO  RESTASIS 0.05 % ophthalmic emulsion PLACE 1 DROP INTO BOTH EYES 2 (TWO) TIMES DAILY. USE ONCE DAILY  11/15/17  Yes Gust Rung, DO  rosuvastatin (CRESTOR) 20 MG tablet Take 1 tablet (20 mg total) by mouth daily. 06/08/17  Yes Angelita Ingles, MD  warfarin (COUMADIN) 4 MG tablet TAKE 1 TAB BY MOUTH DAILY AT 6PM ON MON, WED. AND FRIDAYS. ON ALL OTHER DAYS, TAKE ONLY 1/2 TABLET. 10/25/17  Yes Narendra, Nischal, MD  carvedilol (COREG) 12.5 MG tablet TAKE 1 TABLET (12.5 MG TOTAL) BY MOUTH 2 (TWO) TIMES DAILY. 08/03/17 11/01/17  Regan Lemming, MD   Past Medical History:  Diagnosis Date  . Arthritis   . Asthma   . Bronchitis   . CHF (congestive heart failure) (HCC)   . Diabetes (HCC) 2019  . DVT, lower extremity, recurrent (HCC)    Patient had unprovoked PE on 2002 and DVT in right lower extremety 2008.  [name] GERD (gastroesophageal reflux disease)   . History of kidney stones   . Hypertension   . PE (pulmonary embolism)    Patient had unprovoked PE on 2002  . PONV (postoperative nausea and vomiting)   . Vertigo    Social History   Socioeconomic History  . Marital status: Divorced    Spouse name: Not on file  . Number of children: 5  . Years of education: 9 th grade  . Highest education level: 9th grade  Occupational History  . Occupation: Merchandiser, retail  . Financial resource strain: Somewhat hard  . Food insecurity:    Worry: Never true    Inability: Sometimes true  . Transportation needs:    Medical: No    Non-medical: Yes  Tobacco Use  . Smoking status: Former Smoker    Types: Cigarettes    Last attempt to quit: 09/18/1988    Years since quitting: 29.3  . Smokeless tobacco: Never Used  . Tobacco comment: 6 pack year smoking history as a teen  Substance and Sexual Activity  . Alcohol use: No  . Drug use: No  . Sexual activity: Never    Birth control/protection: None  Lifestyle  . Physical activity:    Days per week: 2 days    Minutes per session: 20 min  . Stress: To some extent  Relationships  . Social connections:    Talks on phone: More than three  times a week    Gets together: Once a week    Attends religious service: More than 4 times per year    Active member of club or organization: No    Attends meetings of clubs or organizations: Never    Relationship status: Divorced  Other Topics Concern  . Not on file  Social History Narrative   Current Social History 05/17/2017        Patient lives with 79 yo granddaughter" in a/an home / condo / townhome which is 1 story/stories. There are not steps up to the entrance the patient uses.       Patient's  method of transportation is church member.      The highest level of education was some high school.      The patient currently retired.      Identified important Relationships are God, family       Pets : 1 lab/pitt mix named Cinnamon       Interests / Fun: Church,TV       Current Stressors: "Me and my granddaughter"       Religious / Personal Beliefs: I'm Holiness and I love people"       Other: "I'd give away my last dime."    Family History  Problem Relation Age of Onset  . Cancer Mother   . Hypertension Sister   . Diabetes Sister   . Hypertension Brother   . Diabetes Brother   . Hypertension Sister   . Diabetes Sister   . Colon cancer Other   . Heart Problems Other 34       sister's child, open heart surgery  . Stroke Father   . Diabetes Son   . Hypertension Son   . Kidney disease Son        on dialysis  . Hypertension Sister   . Diabetes Sister   . Hypertension Sister   . Diabetes Sister   . Cancer Brother   . Hypertension Son   . Diabetes Son   . Hypertension Daughter   . Schizophrenia Daughter     ASSESSMENT Recent Results: The most recent result is correlated with 16 mg per week: Lab Results  Component Value Date   INR 1.8 (A) 01/15/2018   INR 2.8 12/25/2017   INR 1.7 (A) 12/11/2017    Anticoagulation Dosing: Description   Take only one-half (1/2) of your 4mg  blue-colored warfarin tablets by mouth, once-daily at 6PM each day--EXCEPT on   MONDAYS andTHURSDAYS, take one (1) tablet of your 4mg  blue-colored warfarin tablets on MONDAYS and THURSDAYS.      INR today: Subtherapeutic  PLAN Weekly dose was increased by 12% to 18 mg per week  Patient Instructions  Patient instructed to take medications as defined in the Anti-coagulation Track section of this encounter.  Patient instructed to take today's dose.  Patient instructed to take only one-half (1/2) of your 4mg  blue-colored warfarin tablets by mouth, once-daily at 6PM each day--EXCEPT on  MONDAYS andTHURSDAYS, take one (1) tablet of your 4mg  blue-colored warfarin tablets on MONDAYS and THURSDAYS. Patient verbalized understanding of these instructions.     Patient advised to contact clinic or seek medical attention if signs/symptoms of bleeding or thromboembolism occur.  Patient verbalized understanding by repeating back information and was advised to contact me if further medication-related questions arise. Patient was also provided an information handout.  Follow-up Return in 3 weeks (on 02/05/2018) for Follow up INR at 1030h.  Elicia Lamp, PharmD, CPP  15 minutes spent face-to-face with the patient during the encounter. 50% of time spent on education. 50% of time was spent on fingerstick point of care INR sample collection, processing, results determination, dose adjustment and documentation in [email or web address].

## 2018-01-15 NOTE — Patient Instructions (Signed)
Patient instructed to take medications as defined in the Anti-coagulation Track section of this encounter.  Patient instructed to take today's dose.  Patient instructed to take only one-half (1/2) of your 4mg  blue-colored warfarin tablets by mouth, once-daily at 6PM each day--EXCEPT on  MONDAYS andTHURSDAYS, take one (1) tablet of your 4mg  blue-colored warfarin tablets on MONDAYS and THURSDAYS. Patient verbalized understanding of these instructions.

## 2018-01-16 ENCOUNTER — Other Ambulatory Visit: Payer: Self-pay | Admitting: *Deleted

## 2018-01-16 DIAGNOSIS — J301 Allergic rhinitis due to pollen: Secondary | ICD-10-CM

## 2018-01-16 NOTE — Progress Notes (Signed)
INTERNAL MEDICINE TEACHING ATTENDING ADDENDUM - Jareb Radoncic M.D  Duration- indefinite, Indication- PE, DVT, INR- sub therapeutic. Agree with pharmacy recommendations as outlined in their note.      

## 2018-01-17 MED ORDER — FLUTICASONE PROPIONATE 50 MCG/ACT NA SUSP: 2.0000 | Freq: Every day | NASAL | 1 refills | Status: DC

## 2018-01-17 NOTE — Telephone Encounter (Signed)
Ok to send in refills for TABLETS

## 2018-01-18 ENCOUNTER — Other Ambulatory Visit: Payer: Self-pay | Admitting: Internal Medicine

## 2018-01-18 DIAGNOSIS — I824Y9 Acute embolism and thrombosis of unspecified deep veins of unspecified proximal lower extremity: Secondary | ICD-10-CM

## 2018-01-27 ENCOUNTER — Other Ambulatory Visit: Payer: Self-pay | Admitting: Internal Medicine

## 2018-01-27 DIAGNOSIS — J4530 Mild persistent asthma, uncomplicated: Secondary | ICD-10-CM

## 2018-01-29 ENCOUNTER — Emergency Department (HOSPITAL_COMMUNITY)
Admission: EM | Admit: 2018-01-29 | Discharge: 2018-01-29 | Disposition: A | Discharge status: Home / Self Care | Payer: Medicare Other | Attending: Emergency Medicine | Admitting: Emergency Medicine

## 2018-01-29 ENCOUNTER — Encounter (HOSPITAL_COMMUNITY): Payer: Self-pay

## 2018-01-29 ENCOUNTER — Other Ambulatory Visit: Payer: Self-pay

## 2018-01-29 DIAGNOSIS — I110 Hypertensive heart disease with heart failure: Secondary | ICD-10-CM | POA: Insufficient documentation

## 2018-01-29 DIAGNOSIS — Z79899 Other long term (current) drug therapy: Secondary | ICD-10-CM | POA: Diagnosis not present

## 2018-01-29 DIAGNOSIS — I5042 Chronic combined systolic (congestive) and diastolic (congestive) heart failure: Secondary | ICD-10-CM | POA: Diagnosis not present

## 2018-01-29 DIAGNOSIS — Z7901 Long term (current) use of anticoagulants: Secondary | ICD-10-CM | POA: Insufficient documentation

## 2018-01-29 DIAGNOSIS — Z91040 Latex allergy status: Secondary | ICD-10-CM | POA: Insufficient documentation

## 2018-01-29 DIAGNOSIS — E119 Type 2 diabetes mellitus without complications: Secondary | ICD-10-CM | POA: Diagnosis not present

## 2018-01-29 DIAGNOSIS — R300 Dysuria: Secondary | ICD-10-CM | POA: Insufficient documentation

## 2018-01-29 DIAGNOSIS — Z7984 Long term (current) use of oral hypoglycemic drugs: Secondary | ICD-10-CM | POA: Diagnosis not present

## 2018-01-29 DIAGNOSIS — Z87891 Personal history of nicotine dependence: Secondary | ICD-10-CM | POA: Insufficient documentation

## 2018-01-29 LAB — URINALYSIS, ROUTINE W REFLEX MICROSCOPIC
Bilirubin Urine: NEGATIVE
Glucose, UA: NEGATIVE mg/dL
Hgb urine dipstick: NEGATIVE
Ketones, ur: NEGATIVE mg/dL
LEUKOCYTES UA: NEGATIVE
Nitrite: NEGATIVE
Protein, ur: NEGATIVE mg/dL
Specific Gravity, Urine: 1.002 — ABNORMAL LOW (ref 1.005–1.030)
pH: 7 (ref 5.0–8.0)

## 2018-01-29 LAB — CBG MONITORING, ED: Glucose-Capillary: 150 mg/dL — ABNORMAL HIGH (ref 70–99)

## 2018-01-29 NOTE — ED Provider Notes (Addendum)
[facility]   CSN: [id number] Arrival date & time: 01/29/18  1030     History   Chief Complaint Chief Complaint  Patient presents with  . Dysuria  . Abdominal Pain    HPI Caroline MccallumDaisy N [name] is a 73 y.o. female.  HPI  73 year old female with history of CHF, diabetes, DVT presents today stating she has had some burning with urination for the past 2 days.  She has also had some increased frequency of urination.  She reports that she had a subjective fever 2 days ago and had some sweating with that.  She has not had any symptoms since then.  She denies any chest pain, dyspnea, abdominal pain, or vomiting.  She has had some mild nausea.  She reports that she had 3 loose bowel movements yesterday but is not having any problems eating, drinking, or with loose stools today.  Nurses notes state abdominal pain, but patient denies having any abdominal pain currently to me.   Past Medical History:  Diagnosis Date  . Arthritis   . Asthma   . Bronchitis   . CHF (congestive heart failure) (HCC)   . Diabetes (HCC) 2019  . DVT, lower extremity, recurrent (HCC)    Patient had unprovoked PE on 2002 and DVT in right lower extremety 2008.  [name]. GERD (gastroesophageal reflux disease)   . History of kidney stones   . Hypertension   . PE (pulmonary embolism)    Patient had unprovoked PE on 2002  . PONV (postoperative nausea and vomiting)   . Vertigo     Patient Active Problem List   Diagnosis Date Noted  . Hx of bee sting allergy 10/31/2017  . Depression 10/31/2017  . Hematuria, microscopic 09/13/2017  . Need for immunization against influenza 08/29/2017  . Asthma, chronic, mild persistent, uncomplicated 08/29/2017  . Type 2 diabetes mellitus with complication, without long-term current use of insulin (HCC) 06/02/2017  . Hypokalemia 05/01/2017  . History of Clostridium difficile colitis 12/31/2016  . Anticoagulated on warfarin   . Orthostatic  hypotension   . Lipodermatosclerosis 10/11/2016  . Morbid obesity with BMI of 50.0-59.9, adult (HCC) 05/02/2016  . Chronic combined systolic and diastolic CHF, NYHA class 1 (HCC) 09/21/2015  . Asthma exacerbation, mild 10/30/2012  . DVT, lower extremity, recurrent (HCC)   . Vertigo 09/13/2011  . Allergic rhinitis 06/21/2006  . Hyperlipidemia 06/10/2006  . Essential hypertension 06/10/2006    Past Surgical History:  Procedure Laterality Date  . ABDOMINAL HYSTERECTOMY     1980  . CYSTOSCOPY W/ URETERAL STENT PLACEMENT Right 09/16/2015   Procedure: CYSTOSCOPY WITH RETROGRADE PYELOGRAM/URETERAL STENT PLACEMENT;  Surgeon: Alfredo MartinezScott MacDiarmid, MD;  Location: [facility] OR;  Service: Urology;  Laterality: Right;  . CYSTOSCOPY WITH RETROGRADE PYELOGRAM, URETEROSCOPY AND STENT PLACEMENT Right 12/11/2015   Procedure: RIGHT URETEROSCOPY/HOLMIUM LASER LITHOTRIPSY AND STONE REMOVAL removal and placement of double j stent;  Surgeon: Crist FatBenjamin W Herrick, MD;  Location: [facility] ORS;  Service: Urology;  Laterality: Right;  . HOLMIUM LASER APPLICATION Right 12/11/2015   Procedure: HOLMIUM LASER APPLICATION;  Surgeon: Crist FatBenjamin W Herrick, MD;  Location: [facility] ORS;  Service: Urology;  Laterality: Right;     OB History   No obstetric history on file.      Home Medications    Prior to Admission medications   Medication Sig Start Date End Date Taking? Authorizing Provider  budesonide-formoterol (SYMBICORT) 160-4.5 MCG/ACT inhaler Inhale 2 puffs into the lungs 2 (two) times daily. 08/29/17  Yes Molt, Bethany, DO  calcium citrate-vitamin D (CITRACAL+D) 315-200 MG-UNIT tablet Take 2 tablets by mouth daily. 12/14/17  Yes Earl Lagos, MD  carvedilol (COREG) 12.5 MG tablet TAKE 1 TABLET (12.5 MG TOTAL) BY MOUTH 2 (TWO) TIMES DAILY. 08/03/17 01/29/18 Yes Camnitz, Will [name], MD  famotidine (PEPCID) 20 MG tablet Take 1 tablet (20 mg total) by mouth 2 (two) times daily. 11/08/17  Yes Earl Lagos, MD  fluticasone (FLONASE) 50  MCG/ACT nasal spray Place 2 sprays into both nostrils daily. 01/17/18  Yes Earl Lagos, MD  furosemide (LASIX) 40 MG tablet Take 1 tablet (40 mg total) by mouth every other day. 12/15/17  Yes Earl Lagos, MD  hydrocortisone (PROCTOSOL HC) 2.5 % rectal cream Place 1 application rectally 2 (two) times daily. 10/26/17  Yes Earl Lagos, MD  losartan (COZAAR) 100 MG tablet Take 1 tablet (100 mg total) by mouth daily. 08/29/17  Yes Molt, Bethany, DO  magnesium oxide (MAG-OX) 400 MG tablet Take 1 tablet (400 mg total) by mouth 2 (two) times daily. Please make overdue appt with Dr. Elberta Fortis before anymore refills. 1st attempt Patient taking differently: Take 400 mg by mouth 2 (two) times daily.  01/17/18  Yes Camnitz, Will [name], MD  meclizine (ANTIVERT) 25 MG tablet Take 1 tablet (25 mg total) by mouth 3 (three) times daily as needed. Patient taking differently: Take 25 mg by mouth 3 (three) times daily as needed for dizziness or nausea.  10/30/17  Yes Seawell, Jaimie A, DO  metFORMIN (GLUCOPHAGE-XR) 500 MG 24 hr tablet TAKE 1 TABLET BY MOUTH EVERY DAY WITH BREAKFAST Patient taking differently: Take 500 mg by mouth daily.  08/28/17  Yes Molt, Bethany, DO  montelukast (SINGULAIR) 10 MG tablet Take 1 tablet (10 mg total) by mouth at bedtime. 08/29/17  Yes Molt, Bethany, DO  pantoprazole (PROTONIX) 40 MG tablet Take 1 tablet (40 mg total) by mouth daily. Patient taking differently: Take 40 mg by mouth every evening.  09/01/17  Yes Molt, Bethany, DO  phenol (CHLORASEPTIC) 1.4 % LIQD Use as directed 2 sprays in the mouth or throat as needed for throat irritation / pain. 10/30/17  Yes Seawell, Jaimie A, DO  PROAIR HFA 108 (90 Base) MCG/ACT inhaler TAKE 2 PUFFS BY MOUTH EVERY 6 HOURS AS NEEDED FOR WHEEZE OR SHORTNESS OF BREATH Patient taking differently: Inhale 2 puffs into the lungs every 6 (six) hours as needed for wheezing or shortness of breath.  10/31/17  Yes Narendra, Nischal, MD  RESTASIS 0.05 %  ophthalmic emulsion PLACE 1 DROP INTO BOTH EYES 2 (TWO) TIMES DAILY. USE ONCE DAILY Patient taking differently: Place 1 drop into both eyes 2 (two) times daily.  08/29/17  Yes Molt, Bethany, DO  rosuvastatin (CRESTOR) 20 MG tablet Take 1 tablet (20 mg total) by mouth daily. Patient taking differently: Take 20 mg by mouth at bedtime.  06/08/17  Yes Angelita Ingles, MD  warfarin (COUMADIN) 4 MG tablet TAKE 1 TAB BY MOUTH DAILY AT 6PM MONDAYS AND THURSDAYS. ON ALL OTHER DAYS, TAKE ONLY 1/2 TABLET. Patient taking differently: Take 2-4 mg by mouth See admin instructions. TAKE 1 TAB BY MOUTH DAILY AT 6PM MONDAYS AND THURSDAYS. ON ALL OTHER DAYS, TAKE ONLY 1/2 TABLET. 01/18/18  Yes Narendra, Nischal, MD  EPINEPHrine 0.3 mg/0.3 mL IJ SOAJ injection Inject 0.3 mg into the muscle as needed. Severe allergic reactions 10/30/17   [provider]  RESTASIS 0.05 % ophthalmic emulsion PLACE 1 DROP INTO BOTH EYES 2 (TWO) TIMES  DAILY. USE ONCE DAILY Patient not taking: Reported on 01/29/2018 11/15/17   Gust RungHoffman, Erik C, DO    Family History Family History  Problem Relation Age of Onset  . Cancer Mother   . Hypertension Sister   . Diabetes Sister   . Hypertension Brother   . Diabetes Brother   . Hypertension Sister   . Diabetes Sister   . Colon cancer Other   . Heart Problems Other 34       sister's child, open heart surgery  . Stroke Father   . Diabetes Son   . Hypertension Son   . Kidney disease Son        on dialysis  . Hypertension Sister   . Diabetes Sister   . Hypertension Sister   . Diabetes Sister   . Cancer Brother   . Hypertension Son   . Diabetes Son   . Hypertension Daughter   . Schizophrenia Daughter     Social History Social History   Tobacco Use  . Smoking status: Former Smoker    Types: Cigarettes    Last attempt to quit: 09/18/1988    Years since quitting: 29.3  . Smokeless tobacco: Never Used  . Tobacco comment: 6 pack year smoking history as a teen  Substance Use  Topics  . Alcohol use: No  . Drug use: No     Allergies   Penicillins; Cabbage; Keflex [cephalexin]; Shellfish allergy; Tomato; and Latex   Review of Systems Review of Systems  All other systems reviewed and are negative.    Physical Exam Updated Vital Signs Pulse 81   Temp 98.3 F (36.8 C) (Oral)   Resp 19   Ht 1.6 m (5\' 3" )   Wt 126.8 kg   SpO2 100%   BMI 49.52 kg/m   Physical Exam Vitals signs and nursing note reviewed.  Constitutional:      Appearance: She is well-developed. She is obese.  HENT:     Head: Normocephalic and atraumatic.     Mouth/Throat:     Mouth: Mucous membranes are moist.  Eyes:     Extraocular Movements: Extraocular movements intact.  Cardiovascular:     Rate and Rhythm: Normal rate and regular rhythm.  Pulmonary:     Effort: Pulmonary effort is normal.     Breath sounds: Normal breath sounds.  Abdominal:     General: Abdomen is protuberant. Bowel sounds are normal. There is no abdominal bruit.     Palpations: Abdomen is soft.     Tenderness: There is no abdominal tenderness.  Genitourinary:    Vagina: Normal. No vaginal discharge, erythema or tenderness.  Skin:    General: Skin is warm and dry.     Capillary Refill: Capillary refill takes less than 2 seconds.  Neurological:     General: No focal deficit present.     Mental Status: She is alert and oriented to person, place, and time.  Psychiatric:        Mood and Affect: Mood normal.        Behavior: Behavior normal.      ED Treatments / Results  Labs (all labs ordered are listed, but only abnormal results are displayed) Labs Reviewed  URINALYSIS, ROUTINE W REFLEX MICROSCOPIC - Abnormal; Notable for the following components:      Result Value   Color, Urine COLORLESS (*)    Specific Gravity, Urine 1.002 (*)    All other components within normal limits  CBG MONITORING, ED - Abnormal; Notable for  the following components:   Glucose-Capillary 150 (*)    All other  components within normal limits    EKG None  Radiology No results found.  Procedures Procedures (including critical care time)  Medications Ordered in ED Medications - No data to display   Initial Impression / Assessment and Plan / ED Course  I have reviewed the triage vital signs and the nursing notes.  Pertinent labs & imaging results that were available during my care of the patient were reviewed by me and considered in my medical decision making (see chart for details).     Patient presents today complaining of dysuria.  External examination reveals no signs of rash or external causes for pain.  Urinalysis was obtained via and out cath and is clear.  I have discussed results with patient.  I currently do not think she needs to be started on antibiotic or requires other care.  We discussed return precautions and she has follow-up tomorrow in the outpatient clinic.  Final Clinical Impressions(s) / ED Diagnoses   Final diagnoses:  Dysuria    ED Discharge Orders    None       Margarita Grizzle, MD 01/29/18 3143    Margarita Grizzle, MD 01/29/18 1220

## 2018-01-29 NOTE — ED Triage Notes (Signed)
Pt arrives from home with complaints of abdominal pain, diarrhea, and painful urination for several days. Pt states the pain began when she ate some corn a few days ago. Pt placed in position of comfort with call bell in reach.

## 2018-01-29 NOTE — Discharge Instructions (Addendum)
Your urine appears clear here. It is being cultured Please keep your follow up appointment as scheduled. Return if you are worse, especially fever, or worsening pain.

## 2018-01-29 NOTE — ED Notes (Signed)
Patient verbalizes understanding of discharge instructions. Opportunity for questioning and answers were provided. Armband removed by staff, pt discharged from ED ambulatory.   

## 2018-01-30 ENCOUNTER — Ambulatory Visit (INDEPENDENT_AMBULATORY_CARE_PROVIDER_SITE_OTHER): Payer: Medicare Other | Admitting: Licensed Clinical Social Worker

## 2018-01-30 ENCOUNTER — Encounter: Payer: Self-pay | Admitting: Licensed Clinical Social Worker

## 2018-01-30 ENCOUNTER — Ambulatory Visit: Payer: Medicare Other | Admitting: Dietician

## 2018-01-30 ENCOUNTER — Ambulatory Visit (INDEPENDENT_AMBULATORY_CARE_PROVIDER_SITE_OTHER): Payer: Medicare Other | Admitting: Internal Medicine

## 2018-01-30 ENCOUNTER — Encounter: Payer: Self-pay | Admitting: Internal Medicine

## 2018-01-30 VITALS — BP 136/47 | HR 78 | Temp 97.7°F | Ht 63.0 in | Wt 275.2 lb

## 2018-01-30 DIAGNOSIS — Z23 Encounter for immunization: Secondary | ICD-10-CM | POA: Diagnosis not present

## 2018-01-30 DIAGNOSIS — K219 Gastro-esophageal reflux disease without esophagitis: Secondary | ICD-10-CM

## 2018-01-30 DIAGNOSIS — I5042 Chronic combined systolic (congestive) and diastolic (congestive) heart failure: Secondary | ICD-10-CM | POA: Diagnosis not present

## 2018-01-30 DIAGNOSIS — Z6841 Body Mass Index (BMI) 40.0 and over, adult: Secondary | ICD-10-CM

## 2018-01-30 DIAGNOSIS — J4521 Mild intermittent asthma with (acute) exacerbation: Secondary | ICD-10-CM

## 2018-01-30 DIAGNOSIS — J4530 Mild persistent asthma, uncomplicated: Secondary | ICD-10-CM

## 2018-01-30 DIAGNOSIS — I82409 Acute embolism and thrombosis of unspecified deep veins of unspecified lower extremity: Secondary | ICD-10-CM

## 2018-01-30 DIAGNOSIS — F329 Major depressive disorder, single episode, unspecified: Secondary | ICD-10-CM

## 2018-01-30 DIAGNOSIS — E785 Hyperlipidemia, unspecified: Secondary | ICD-10-CM | POA: Diagnosis not present

## 2018-01-30 DIAGNOSIS — E7800 Pure hypercholesterolemia, unspecified: Secondary | ICD-10-CM

## 2018-01-30 DIAGNOSIS — I110 Hypertensive heart disease with heart failure: Secondary | ICD-10-CM | POA: Diagnosis not present

## 2018-01-30 DIAGNOSIS — Z79899 Other long term (current) drug therapy: Secondary | ICD-10-CM

## 2018-01-30 DIAGNOSIS — Z7984 Long term (current) use of oral hypoglycemic drugs: Secondary | ICD-10-CM

## 2018-01-30 DIAGNOSIS — R7309 Other abnormal glucose: Secondary | ICD-10-CM

## 2018-01-30 DIAGNOSIS — Z86718 Personal history of other venous thrombosis and embolism: Secondary | ICD-10-CM

## 2018-01-30 DIAGNOSIS — I10 Essential (primary) hypertension: Secondary | ICD-10-CM

## 2018-01-30 DIAGNOSIS — Z7951 Long term (current) use of inhaled steroids: Secondary | ICD-10-CM

## 2018-01-30 DIAGNOSIS — Z0000 Encounter for general adult medical examination without abnormal findings: Secondary | ICD-10-CM

## 2018-01-30 DIAGNOSIS — Z7901 Long term (current) use of anticoagulants: Secondary | ICD-10-CM

## 2018-01-30 DIAGNOSIS — Z86711 Personal history of pulmonary embolism: Secondary | ICD-10-CM

## 2018-01-30 DIAGNOSIS — E118 Type 2 diabetes mellitus with unspecified complications: Secondary | ICD-10-CM | POA: Diagnosis not present

## 2018-01-30 LAB — URINE CULTURE: Culture: <10000 — AB

## 2018-01-30 LAB — GLUCOSE, CAPILLARY: GLUCOSE-CAPILLARY: 127 mg/dL — AB (ref 70–99)

## 2018-01-30 MED ORDER — CARVEDILOL 12.5 MG PO TABS: 12.5000 mg | ORAL_TABLET | Freq: Two times a day (BID) | ORAL | 1 refills | Status: DC

## 2018-01-30 MED ORDER — ROSUVASTATIN CALCIUM 20 MG PO TABS: 20.0000 mg | ORAL_TABLET | Freq: Every day | ORAL | 3 refills | Status: DC

## 2018-01-30 MED ORDER — METFORMIN HCL ER 500 MG PO TB24: ORAL_TABLET | ORAL | 1 refills | Status: DC

## 2018-01-30 MED ORDER — FAMOTIDINE 20 MG PO TABS: 20.0000 mg | ORAL_TABLET | Freq: Two times a day (BID) | ORAL | 1 refills | Status: DC

## 2018-01-30 MED ORDER — LOSARTAN POTASSIUM 100 MG PO TABS: 100.0000 mg | ORAL_TABLET | Freq: Every day | ORAL | 1 refills | Status: DC

## 2018-01-30 MED ORDER — PANTOPRAZOLE SODIUM 40 MG PO TBEC: 40.0000 mg | DELAYED_RELEASE_TABLET | Freq: Every day | ORAL | 1 refills | Status: DC

## 2018-01-30 NOTE — Assessment & Plan Note (Addendum)
-  This problem is chronic and stable -Patient states that she does have occasional wheezing but that her symptoms are well controlled with albuterol as needed -We will refill her pro-air inhaler today -Continue with Singulair -No further work-up at this time

## 2018-01-30 NOTE — Assessment & Plan Note (Signed)
-  This problem is chronic and stable -We will refill her Crestor 20 mg daily -No further work-up at this time

## 2018-01-30 NOTE — Patient Instructions (Addendum)
-  It was a pleasure seeing you today -Please follow-up with me again in 3 months -Please schedule an appointment for your eye exam -We will give you a tetanus shot today -Please continue to try and watch your diet and exercise and try to lose a little weight -I will refill your medications today -Please call me if you have any questions

## 2018-01-30 NOTE — Assessment & Plan Note (Signed)
-  This problem is chronic and stable -Patient PHQ 9 score has improved to 7 from 11 on her previous visit -Patient follow-up with our behavioral health clinician Phineas Semen) for her depression -We will hold off on medication for now

## 2018-01-30 NOTE — Assessment & Plan Note (Signed)
BP Readings from Last 3 Encounters:  01/30/18 (!) 136/47  01/29/18 (!) 125/53  11/06/17 (!) 145/66    Lab Results  Component Value Date   NA 143 09/18/2017   K 3.9 09/18/2017   CREATININE 0.88 09/18/2017    Assessment: Blood pressure control:  Well controlled Progress toward BP goal:   At goal Comments: Patient is compliant with carvedilol 12.5 mg twice daily, losartan 100 mg daily as well as Lasix 40 mg every other day   Plan: Medications:  continue current medications Educational resources provided:   Self management tools provided:   Other plans:

## 2018-01-30 NOTE — Assessment & Plan Note (Addendum)
-  This problem is chronic and stable -Patient states that her symptoms are well controlled with Protonix 40 mg daily -We will refill her Protonix today -Of note, patient is also on Pepcid 20 mg twice daily.  Will DC this today -No further work-up at this time

## 2018-01-30 NOTE — Assessment & Plan Note (Signed)
-  This problem is chronic and stable -Patient's last A1c was 6.6 in December -Her blood sugar today was 127 -We will continue with metformin 500 mg daily -No further work-up at this time

## 2018-01-30 NOTE — Assessment & Plan Note (Signed)
-  Patient has a history of recurrent DVT and PEs -Her last INR was subtherapeutic and she had her Coumadin dose increased -She will follow-up on Monday for repeat INR check -No further work-up at this time

## 2018-01-30 NOTE — Assessment & Plan Note (Signed)
-  This problem is chronic and stable -We discussed the importance of diet and exercise today -Patient's weight is down approximately 5 pounds since her last visit -She states that she will continue to watch her weight and work on losing another 5 pounds before her next visit -We will follow-up on her next visit

## 2018-01-30 NOTE — Assessment & Plan Note (Signed)
-  Tetanus shot given today

## 2018-01-30 NOTE — BH Specialist Note (Signed)
Integrated Behavioral Health Initial Visit  MRN: [id number] Name: Caroline [name]  Number of Integrated Behavioral Health Clinician visits:: 1/6 Session Start time: 10:12  Session End time: 10:45 Total time: 33 minutes  Type of Service: Integrated Behavioral Health- Individual/Family Interpretor:No.          SUBJECTIVE: Caroline [name] is a 73 y.o. female accompanied by whom attended the session individually. Patient was referred by Dr. Cleaster Corin for depression. Patient reports the following symptoms/concerns: decreased depressive symptoms since original referral and issues with family dynamics.  Duration of problem: at least three months; Severity of problem: mild  OBJECTIVE: Mood: Netural and Affect: Appropriate Risk of harm to self or others: No plan to harm self or others  LIFE CONTEXT: Family and Social: Patient lives with her granddaughter (6 years old), and Lequita Halt (patient referred to him as a "boarder"). Patient identified Lequita Halt as one of her main natural supports. Patient reported her depression has decreased since October due to an improved relationship with her oldest daughter. Patient is currently having interpersonal relationship issues with her youngest daughter, whom suffers from Bipolar Disorder. Patient is involved in her church, and identified her [employer] and [employer] as social supports.  Self-Care: Patient uses her faith, cooking, and family time as healthy ways to cope with stress. Patient identified that her diabetes are currently controlled. Patient reported she wants to loose 15 pounds over the next three months, and identified healthy ways that she plans to loose the weight.  Life Changes: Patient went to the ED yesterday due to stomach related issues. Improved relationship with her oldest daughter.   GOALS ADDRESSED: Patient will: 1. Reduce symptoms of: depression and stress 2. Increase knowledge and/or ability of: coping skills, healthy habits and stress  reduction  3. Demonstrate ability to: Increase healthy adjustment to current life circumstances, Increase adequate support systems for patient/family and set limits with family members.   INTERVENTIONS: Interventions utilized: Solution-Focused Strategies and Supportive Counseling. Therapist supported the patient as she identified triggers for depressive symptoms. ACT was used to assist the patient in identifying areas of the patient's life that she can control, and how to accept issues she cannot change. Family dynamics were discussed.  Standardized Assessments completed: assessed for SI, HI, and self-harm.  ASSESSMENT: Patient currently experiencing decreased PHQ-9 score from October (11 to 7 at this visit). Patient identified the reason for the decrease is due to an improved relationship with her oldest daughter. Patient has also made improvements with focusing on what is within her control regarding interactions with her children. Patient continues to worry about her youngest daughter, whom is diagnosed with Bipolar disorder. Patient plans to practice following through on behaviors that are important to her, and acknowledge that she cannot control others responses.  Patient expressed no SI, HI, or self-harm. Patient appeared to be in a positive mood, and was goal-oriented.    Patient may benefit from outpatient therapy at least once a month.  PLAN: 1. Follow up with behavioral health clinician on : one month.   Lysle Rubens, LPC, LCAS

## 2018-01-30 NOTE — Progress Notes (Signed)
   Subjective:    Patient ID: Caroline [name], female    DOB: 22-Jul-1945, 73 y.o.   MRN: [id number]  HPI  I have seen and examined this patient.  Patient is here for routine follow-up of her hypertension and diabetes.  Patient states that she feels well today and has no new complaints.  She does need some refills on her medications.  Review of Systems  Constitutional: Negative.   HENT: Negative.   Respiratory: Negative for cough, chest tightness and shortness of breath.        Does complain of some occasional wheezing but this is relieved with her albuterol inhaler  Cardiovascular: Negative.   Gastrointestinal: Negative.   Musculoskeletal: Negative.   Neurological: Negative.   Psychiatric/Behavioral: Negative.        Objective:   Physical Exam Constitutional:      Appearance: Normal appearance.  HENT:     Head: Normocephalic and atraumatic.     Mouth/Throat:     Mouth: Mucous membranes are moist.     Pharynx: Oropharynx is clear. No oropharyngeal exudate.  Neck:     Musculoskeletal: Neck supple.  Cardiovascular:     Rate and Rhythm: Normal rate and regular rhythm.     Heart sounds: Normal heart sounds.  Pulmonary:     Effort: Pulmonary effort is normal.     Breath sounds: Normal breath sounds. No wheezing or rales.  Abdominal:     General: Bowel sounds are normal. There is no distension.     Palpations: Abdomen is soft.     Tenderness: There is no abdominal tenderness.  Musculoskeletal:        General: No swelling or tenderness.  Lymphadenopathy:     Cervical: No cervical adenopathy.  Neurological:     General: No focal deficit present.     Mental Status: She is alert and oriented to person, place, and time.  Psychiatric:        Behavior: Behavior normal.           Assessment & Plan:  Please see problem based charting for assessment and plan:

## 2018-01-30 NOTE — Assessment & Plan Note (Signed)
-  This problem is chronic and stable -Patient denies any shortness of breath or weight gain -Patient has no lower extremity edema today and her lungs are clear to auscultation.   -She states that she is compliant with her Lasix every other day -We will continue with Lasix 40 mg every other day -No further work-up at this time

## 2018-02-05 ENCOUNTER — Encounter: Payer: Self-pay | Admitting: Pharmacist

## 2018-02-05 ENCOUNTER — Ambulatory Visit (INDEPENDENT_AMBULATORY_CARE_PROVIDER_SITE_OTHER): Payer: Medicare Other | Admitting: Pharmacist

## 2018-02-05 DIAGNOSIS — Z7901 Long term (current) use of anticoagulants: Secondary | ICD-10-CM

## 2018-02-05 DIAGNOSIS — Z5181 Encounter for therapeutic drug level monitoring: Secondary | ICD-10-CM | POA: Diagnosis not present

## 2018-02-05 DIAGNOSIS — I82409 Acute embolism and thrombosis of unspecified deep veins of unspecified lower extremity: Secondary | ICD-10-CM

## 2018-02-05 DIAGNOSIS — Z86718 Personal history of other venous thrombosis and embolism: Secondary | ICD-10-CM | POA: Diagnosis not present

## 2018-02-05 LAB — POCT INR: INR: 2 (ref 2.0–3.0)

## 2018-02-05 NOTE — Patient Instructions (Signed)
Patient instructed to take medications as defined in the Anti-coagulation Track section of this encounter.  Patient instructed to take today's dose.  Patient instructed to take only one-half (1/2) of your 4mg  blue-colored warfarin tablets by mouth, once-daily at 6PM each day--EXCEPT on  MONDAYS, WEDNESDAYS and FRIDAYS, take one (1) tablet of your 4mg  blue-colored warfarin tablets on MONDAYS, WEDNESDAYS and FRIDAYS.  Patient verbalized understanding of these instructions.

## 2018-02-05 NOTE — Progress Notes (Signed)
Anticoagulation Management Caroline [name] is a 73 y.o. female who reports to the clinic for monitoring of warfarin treatment.    Indication: DVT , history of, recurrent; Hx of PE, chronic anticoagulation.   Duration: indefinite Supervising physician: Earl Lagos  Anticoagulation Clinic Visit History: Patient does not report signs/symptoms of bleeding or thromboembolism  Other recent changes: No diet, medications, lifestyle changes.  Anticoagulation Episode Summary    Current INR goal:   2.0-3.0  TTR:   79.8 % (3 y)  Next INR check:   03/05/2018  INR from last check:   2.0 (02/05/2018)  Weekly max warfarin dose:     Target end date:     INR check location:   Anticoagulation Clinic  Preferred lab:     Send INR reminders to:   ANTICOAG IMP   Indications   DVT lower extremity recurrent (HCC) [I82.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 Penicillins Anaphylaxis, Hives, Swelling and Rash    Has patient had a PCN reaction causing immediate rash, facial/tongue/throat swelling, SOB or lightheadedness with hypotension: Yes Has patient had a PCN reaction causing severe rash involving mucus membranes or skin necrosis: Yes Has patient had a PCN reaction that required hospitalization Yes Has patient had a PCN reaction occurring within the last 10 years: No If all of the above answers are "NO", then may proceed with Cephalosporin use.   . Cabbage Itching  . Keflex [Cephalexin] Hives    And feeling of throat tightness  . Shellfish Allergy Swelling  . Tomato Swelling  . Latex Itching and Rash   Prior to Admission medications   Medication Sig Start Date End Date Taking? Authorizing Provider  budesonide-formoterol (SYMBICORT) 160-4.5 MCG/ACT inhaler Inhale 2 puffs into the lungs 2 (two) times daily. 08/29/17  Yes Molt, Bethany, DO  calcium citrate-vitamin D (CITRACAL+D) 315-200 MG-UNIT tablet Take 2 tablets by  mouth daily. 12/14/17  Yes Earl Lagos, MD  carvedilol (COREG) 12.5 MG tablet Take 1 tablet (12.5 mg total) by mouth 2 (two) times daily. 01/30/18 04/30/18 Yes Earl Lagos, MD  EPINEPHrine 0.3 mg/0.3 mL IJ SOAJ injection Inject 0.3 mg into the muscle as needed. Severe allergic reactions 10/30/17  Yes [provider]  fluticasone (FLONASE) 50 MCG/ACT nasal spray Place 2 sprays into both nostrils daily. 01/17/18  Yes Earl Lagos, MD  furosemide (LASIX) 40 MG tablet Take 1 tablet (40 mg total) by mouth every other day. 12/15/17  Yes Earl Lagos, MD  hydrocortisone (PROCTOSOL HC) 2.5 % rectal cream Place 1 application rectally 2 (two) times daily. 10/26/17  Yes Earl Lagos, MD  losartan (COZAAR) 100 MG tablet Take 1 tablet (100 mg total) by mouth daily. 01/30/18  Yes Earl Lagos, MD  magnesium oxide (MAG-OX) 400 MG tablet Take 1 tablet (400 mg total) by mouth 2 (two) times daily. Please make overdue appt with Dr. Elberta Fortis before anymore refills. 1st attempt Patient taking differently: Take 400 mg by mouth 2 (two) times daily.  01/17/18  Yes Camnitz, Will [name], MD  meclizine (ANTIVERT) 25 MG tablet Take 1 tablet (25 mg total) by mouth 3 (three) times daily as needed. Patient taking differently: Take 25 mg by mouth 3 (three) times daily as needed for dizziness or nausea.  10/30/17  Yes Seawell, Jaimie A, DO  metFORMIN (GLUCOPHAGE-XR) 500 MG 24 hr tablet TAKE 1 TABLET BY MOUTH EVERY DAY WITH  BREAKFAST 01/30/18  Yes Earl LagosNarendra, Nischal, MD  montelukast (SINGULAIR) 10 MG tablet Take 1 tablet (10 mg total) by mouth at bedtime. 08/29/17  Yes Molt, Bethany, DO  pantoprazole (PROTONIX) 40 MG tablet Take 1 tablet (40 mg total) by mouth daily. 01/30/18  Yes Earl LagosNarendra, Nischal, MD  phenol (CHLORASEPTIC) 1.4 % LIQD Use as directed 2 sprays in the mouth or throat as needed for throat irritation / pain. 10/30/17  Yes Seawell, Jaimie A, DO  PROAIR HFA 108 (90 Base) MCG/ACT inhaler TAKE 2  PUFFS BY MOUTH EVERY 6 HOURS AS NEEDED FOR WHEEZE OR SHORTNESS OF BREATH 01/30/18  Yes Narendra, Nischal, MD  RESTASIS 0.05 % ophthalmic emulsion PLACE 1 DROP INTO BOTH EYES 2 (TWO) TIMES DAILY. USE ONCE DAILY Patient taking differently: Place 1 drop into both eyes 2 (two) times daily.  08/29/17  Yes Molt, Bethany, DO  RESTASIS 0.05 % ophthalmic emulsion PLACE 1 DROP INTO BOTH EYES 2 (TWO) TIMES DAILY. USE ONCE DAILY 11/15/17  Yes Gust RungHoffman, Erik C, DO  rosuvastatin (CRESTOR) 20 MG tablet Take 1 tablet (20 mg total) by mouth daily. 01/30/18  Yes Earl LagosNarendra, Nischal, MD  warfarin (COUMADIN) 4 MG tablet TAKE 1 TAB BY MOUTH DAILY AT 6PM MONDAYS AND THURSDAYS. ON ALL OTHER DAYS, TAKE ONLY 1/2 TABLET. Patient taking differently: Take 2-4 mg by mouth See admin instructions. TAKE 1 TAB BY MOUTH DAILY AT 6PM MONDAYS AND THURSDAYS. ON ALL OTHER DAYS, TAKE ONLY 1/2 TABLET. 01/18/18  Yes Earl LagosNarendra, Nischal, MD   Past Medical History:  Diagnosis Date  . Arthritis   . Asthma   . Bronchitis   . CHF (congestive heart failure) (HCC)   . Diabetes (HCC) 2019  . DVT, lower extremity, recurrent (HCC)    Patient had unprovoked PE on 2002 and DVT in right lower extremety 2008.  [name]. GERD (gastroesophageal reflux disease)   . History of kidney stones   . Hypertension   . PE (pulmonary embolism)    Patient had unprovoked PE on 2002  . PONV (postoperative nausea and vomiting)   . Vertigo    Social History   Socioeconomic History  . Marital status: Divorced    Spouse name: Not on file  . Number of children: 5  . Years of education: 9 th grade  . Highest education level: 9th grade  Occupational History  . Occupation: Merchandiser, retailretail  Social Needs  . Financial resource strain: Somewhat hard  . Food insecurity:    Worry: Never true    Inability: Sometimes true  . Transportation needs:    Medical: No    Non-medical: Yes  Tobacco Use  . Smoking status: Former Smoker    Types: Cigarettes    Last attempt to quit: 09/18/1988     Years since quitting: 29.4  . Smokeless tobacco: Never Used  . Tobacco comment: 6 pack year smoking history as a teen  Substance and Sexual Activity  . Alcohol use: No  . Drug use: No  . Sexual activity: Never    Birth control/protection: None  Lifestyle  . Physical activity:    Days per week: 2 days    Minutes per session: 20 min  . Stress: To some extent  Relationships  . Social connections:    Talks on phone: More than three times a week    Gets together: Once a week    Attends religious service: More than 4 times per year    Active member of club or organization: No  Attends meetings of clubs or organizations: Never    Relationship status: Divorced  Other Topics Concern  . Not on file  Social History Narrative   Current Social History 05/17/2017        Patient lives with 36 yo granddaughter" in a/an home / condo / townhome which is 1 story/stories. There are not steps up to the entrance the patient uses.       Patient's method of transportation is church member.      The highest level of education was some high school.      The patient currently retired.      Identified important Relationships are God, family       Pets : 1 lab/pitt mix named Cinnamon       Interests / Fun: Church,TV       Current Stressors: "Me and my granddaughter"       Religious / Personal Beliefs: I'm Holiness and I love people"       Other: "I'd give away my last dime."    Family History  Problem Relation Age of Onset  . Cancer Mother   . Hypertension Sister   . Diabetes Sister   . Hypertension Brother   . Diabetes Brother   . Hypertension Sister   . Diabetes Sister   . Colon cancer Other   . Heart Problems Other 34       sister's child, open heart surgery  . Stroke Father   . Diabetes Son   . Hypertension Son   . Kidney disease Son        on dialysis  . Hypertension Sister   . Diabetes Sister   . Hypertension Sister   . Diabetes Sister   . Cancer Brother   . Hypertension  Son   . Diabetes Son   . Hypertension Daughter   . Schizophrenia Daughter     ASSESSMENT Recent Results: The most recent result is correlated with 18 mg per week: Lab Results  Component Value Date   INR 2.0 02/05/2018   INR 1.8 (A) 01/15/2018   INR 2.8 12/25/2017    Anticoagulation Dosing: Description   Take only one-half (1/2) of your 4mg  blue-colored warfarin tablets by mouth, once-daily at 6PM each day--EXCEPT on  MONDAYS, WEDNESDAYS and FRIDAYS, take one (1) tablet of your 4mg  blue-colored warfarin tablets on MONDAYS, WEDNESDAYS and FRIDAYS.       INR today: Therapeutic  PLAN Weekly dose was increased by 11% to 20 mg per week  Patient Instructions  Patient instructed to take medications as defined in the Anti-coagulation Track section of this encounter.  Patient instructed to take today's dose.  Patient instructed to take only one-half (1/2) of your 4mg  blue-colored warfarin tablets by mouth, once-daily at 6PM each day--EXCEPT on  MONDAYS, WEDNESDAYS and FRIDAYS, take one (1) tablet of your 4mg  blue-colored warfarin tablets on MONDAYS, WEDNESDAYS and FRIDAYS.  Patient verbalized understanding of these instructions.     Patient advised to contact clinic or seek medical attention if signs/symptoms of bleeding or thromboembolism occur.  Patient verbalized understanding by repeating back information and was advised to contact me if further medication-related questions arise. Patient was also provided an information handout.  Follow-up Return in about 4 weeks (around 03/05/2018) for Follow up INR at 1015h.  Elicia Lamp, PharmD, CPP  15 minutes spent face-to-face with the patient during the encounter. 50% of time spent on education. 50% of time was spent on fingerstick point of care INR sample  collection, processing, results determination, dose adjustment and documentation in [email or web address].

## 2018-02-07 NOTE — Progress Notes (Signed)
INTERNAL MEDICINE TEACHING ATTENDING ADDENDUM - Devarius Nelles M.D  Duration- indefinite, Indication- recurrent VTE, INR- therapeutic. Agree with pharmacy recommendations as outlined in their note.     

## 2018-02-09 ENCOUNTER — Emergency Department (HOSPITAL_COMMUNITY)
Admission: EM | Admit: 2018-02-09 | Discharge: 2018-02-09 | Disposition: A | Discharge status: Home / Self Care | Payer: Medicare Other | Attending: Emergency Medicine | Admitting: Emergency Medicine

## 2018-02-09 ENCOUNTER — Encounter (HOSPITAL_COMMUNITY): Payer: Self-pay

## 2018-02-09 DIAGNOSIS — E785 Hyperlipidemia, unspecified: Secondary | ICD-10-CM | POA: Diagnosis not present

## 2018-02-09 DIAGNOSIS — Z79899 Other long term (current) drug therapy: Secondary | ICD-10-CM | POA: Insufficient documentation

## 2018-02-09 DIAGNOSIS — Z7901 Long term (current) use of anticoagulants: Secondary | ICD-10-CM | POA: Insufficient documentation

## 2018-02-09 DIAGNOSIS — Z91040 Latex allergy status: Secondary | ICD-10-CM | POA: Insufficient documentation

## 2018-02-09 DIAGNOSIS — L509 Urticaria, unspecified: Secondary | ICD-10-CM | POA: Diagnosis present

## 2018-02-09 DIAGNOSIS — Z87891 Personal history of nicotine dependence: Secondary | ICD-10-CM | POA: Insufficient documentation

## 2018-02-09 DIAGNOSIS — I110 Hypertensive heart disease with heart failure: Secondary | ICD-10-CM | POA: Insufficient documentation

## 2018-02-09 DIAGNOSIS — J45909 Unspecified asthma, uncomplicated: Secondary | ICD-10-CM | POA: Insufficient documentation

## 2018-02-09 DIAGNOSIS — Z86718 Personal history of other venous thrombosis and embolism: Secondary | ICD-10-CM | POA: Insufficient documentation

## 2018-02-09 DIAGNOSIS — I5042 Chronic combined systolic (congestive) and diastolic (congestive) heart failure: Secondary | ICD-10-CM | POA: Insufficient documentation

## 2018-02-09 DIAGNOSIS — Z7984 Long term (current) use of oral hypoglycemic drugs: Secondary | ICD-10-CM | POA: Insufficient documentation

## 2018-02-09 DIAGNOSIS — T7840XA Allergy, unspecified, initial encounter: Secondary | ICD-10-CM | POA: Diagnosis not present

## 2018-02-09 DIAGNOSIS — E119 Type 2 diabetes mellitus without complications: Secondary | ICD-10-CM | POA: Diagnosis not present

## 2018-02-09 LAB — CBG MONITORING, ED: GLUCOSE-CAPILLARY: 101 mg/dL — AB (ref 70–99)

## 2018-02-09 MED ORDER — FAMOTIDINE 20 MG PO TABS
20.0000 mg | ORAL_TABLET | Freq: Once | ORAL | Status: AC
Start: 2018-02-09 — End: 2018-02-09
  Administered 2018-02-09: 20 mg via ORAL
  Filled 2018-02-09: qty 1

## 2018-02-09 NOTE — ED Provider Notes (Signed)
Medical screening examination/treatment/procedure(s) were conducted as a shared visit with non-physician practitioner(s) and myself.  I personally evaluated the patient during the encounter.  Here with concern for allergic reaction. On my exam, has already had benadryl and pepcid. No angiedema. No rash. No GI symptoms. VS WNL. Lungs clear. Plan for observation, but right now appears well. No indication for epi.   None     Aliea Bobe, Barbara Cower, MD 02/10/18 [phone]

## 2018-02-09 NOTE — Discharge Instructions (Addendum)
Get help right away if: You have signs of an allergic reaction. You may notice them soon after being exposed to things that give you an allergic reaction. Signs may include: Warmth in your face. Your face may turn red. Itchy, red, swollen areas of skin. Swelling of your: Eyes. Lips. Face. Mouth. Tongue. Throat. Trouble with any of these: Breathing. Talking. Swallowing. Loud breathing (wheezing). Feeling dizzy or light-headed. Passing out. Pain or cramps in your belly. Throwing up. Watery poop. You had to use your auto-injector pen. You must go to the emergency room even if the medicine seems to be working. This is because another allergic reaction may happen within 3 days (rebound anaphylaxis)

## 2018-02-09 NOTE — ED Provider Notes (Signed)
[facility] EMERGENCY DEPARTMENT Provider Note   CSN: [id number] Arrival date & time: 02/09/18  1649     History   Chief Complaint No chief complaint on file.   HPI Caroline [name] is a 73 y.o. female.  With past medical history of CHF, diabetes, reflux, asthma who presents the emergency department chief complaint of allergic reaction.  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  Patient states that she got hungry and stop for a fish sandwich at [employer].  She is never had any problems eating that sandwich and is able to tolerate most other seafood.  Patient states that when she took a bite she began having itching and hives along with sensation of dry throat difficulty swallowing.  She used her inhaler and took Benadryl.  She has resolution of most of her symptoms.  She has an EpiPen at home and did not have it with her did not use it.  She denies any current throat discomfort, wheezing.  She denies any vomiting, feelings of presyncope. HPI  Past Medical History:  Diagnosis Date  . Arthritis   . Asthma   . Bronchitis   . CHF (congestive heart failure) (HCC)   . Diabetes (HCC) 2019  . DVT, lower extremity, recurrent (HCC)    Patient had unprovoked PE on 2002 and DVT in right lower extremety 2008.  [name] GERD (gastroesophageal reflux disease)   . History of kidney stones   . Hypertension   . PE (pulmonary embolism)    Patient had unprovoked PE on 2002  . PONV (postoperative nausea and vomiting)   . Vertigo     Patient Active Problem List   Diagnosis Date Noted  . GERD (gastroesophageal reflux disease) 01/30/2018  . Hx of bee sting allergy 10/31/2017  . Depression 10/31/2017  . Hematuria, microscopic 09/13/2017  . Asthma, chronic, mild persistent, uncomplicated 08/29/2017  . Type 2 diabetes mellitus with complication, without long-term current use of insulin (HCC) 06/02/2017  . Hypokalemia 05/01/2017  . History of Clostridium difficile colitis  12/31/2016  . Anticoagulated on warfarin   . Orthostatic hypotension   . Lipodermatosclerosis 10/11/2016  . Morbid obesity with BMI of 50.0-59.9, adult (HCC) 05/02/2016  . Chronic combined systolic and diastolic CHF, NYHA class 1 (HCC) 09/21/2015  . DVT, lower extremity, recurrent (HCC)   . Vertigo 09/13/2011  . Healthcare maintenance 09/13/2011  . Allergic rhinitis 06/21/2006  . Hyperlipidemia 06/10/2006  . Essential hypertension 06/10/2006    Past Surgical History:  Procedure Laterality Date  . ABDOMINAL HYSTERECTOMY     1980  . CYSTOSCOPY W/ URETERAL STENT PLACEMENT Right 09/16/2015   Procedure: CYSTOSCOPY WITH RETROGRADE PYELOGRAM/URETERAL STENT PLACEMENT;  Surgeon: Alfredo Martinez, MD;  Location: [facility] OR;  Service: Urology;  Laterality: Right;  . CYSTOSCOPY WITH RETROGRADE PYELOGRAM, URETEROSCOPY AND STENT PLACEMENT Right 12/11/2015   Procedure: RIGHT URETEROSCOPY/HOLMIUM LASER LITHOTRIPSY AND STONE REMOVAL removal and placement of double j stent;  Surgeon: Crist Fat, MD;  Location: [facility] ORS;  Service: Urology;  Laterality: Right;  . HOLMIUM LASER APPLICATION Right 12/11/2015   Procedure: HOLMIUM LASER APPLICATION;  Surgeon: Crist Fat, MD;  Location: [facility] ORS;  Service: Urology;  Laterality: Right;     OB History   No obstetric history on file.      Home Medications    Prior to Admission medications   Medication Sig Start Date End Date Taking? Authorizing Provider  budesonide-formoterol (SYMBICORT) 160-4.5 MCG/ACT inhaler Inhale 2 puffs into the lungs  2 (two) times daily. 08/29/17   Molt, Bethany, DO  calcium citrate-vitamin D (CITRACAL+D) 315-200 MG-UNIT tablet Take 2 tablets by mouth daily. 12/14/17   Earl Lagos, MD  carvedilol (COREG) 12.5 MG tablet Take 1 tablet (12.5 mg total) by mouth 2 (two) times daily. 01/30/18 04/30/18  Earl Lagos, MD  EPINEPHrine 0.3 mg/0.3 mL IJ SOAJ injection Inject 0.3 mg into the muscle as needed. Severe allergic reactions  10/30/17   [provider]  fluticasone (FLONASE) 50 MCG/ACT nasal spray Place 2 sprays into both nostrils daily. 01/17/18   Earl Lagos, MD  furosemide (LASIX) 40 MG tablet Take 1 tablet (40 mg total) by mouth every other day. 12/15/17   Earl Lagos, MD  hydrocortisone (PROCTOSOL HC) 2.5 % rectal cream Place 1 application rectally 2 (two) times daily. 10/26/17   Earl Lagos, MD  losartan (COZAAR) 100 MG tablet Take 1 tablet (100 mg total) by mouth daily. 01/30/18   Earl Lagos, MD  magnesium oxide (MAG-OX) 400 MG tablet Take 1 tablet (400 mg total) by mouth 2 (two) times daily. Please make overdue appt with Dr. Elberta Fortis before anymore refills. 1st attempt Patient taking differently: Take 400 mg by mouth 2 (two) times daily.  01/17/18   Camnitz, Andree Coss, MD  meclizine (ANTIVERT) 25 MG tablet Take 1 tablet (25 mg total) by mouth 3 (three) times daily as needed. Patient taking differently: Take 25 mg by mouth 3 (three) times daily as needed for dizziness or nausea.  10/30/17   Seawell, Jaimie A, DO  metFORMIN (GLUCOPHAGE-XR) 500 MG 24 hr tablet TAKE 1 TABLET BY MOUTH EVERY DAY WITH BREAKFAST 01/30/18   Earl Lagos, MD  montelukast (SINGULAIR) 10 MG tablet Take 1 tablet (10 mg total) by mouth at bedtime. 08/29/17   Molt, Bethany, DO  pantoprazole (PROTONIX) 40 MG tablet Take 1 tablet (40 mg total) by mouth daily. 01/30/18   Earl Lagos, MD  phenol (CHLORASEPTIC) 1.4 % LIQD Use as directed 2 sprays in the mouth or throat as needed for throat irritation / pain. 10/30/17   Seawell, Jaimie A, DO  PROAIR HFA 108 (90 Base) MCG/ACT inhaler TAKE 2 PUFFS BY MOUTH EVERY 6 HOURS AS NEEDED FOR WHEEZE OR SHORTNESS OF BREATH 01/30/18   Narendra, Nischal, MD  RESTASIS 0.05 % ophthalmic emulsion PLACE 1 DROP INTO BOTH EYES 2 (TWO) TIMES DAILY. USE ONCE DAILY Patient taking differently: Place 1 drop into both eyes 2 (two) times daily.  08/29/17   Molt, Bethany, DO  RESTASIS 0.05 %  ophthalmic emulsion PLACE 1 DROP INTO BOTH EYES 2 (TWO) TIMES DAILY. USE ONCE DAILY 11/15/17   Gust Rung, DO  rosuvastatin (CRESTOR) 20 MG tablet Take 1 tablet (20 mg total) by mouth daily. 01/30/18   Earl Lagos, MD  warfarin (COUMADIN) 4 MG tablet TAKE 1 TAB BY MOUTH DAILY AT 6PM MONDAYS AND THURSDAYS. ON ALL OTHER DAYS, TAKE ONLY 1/2 TABLET. Patient taking differently: Take 2-4 mg by mouth See admin instructions. TAKE 1 TAB BY MOUTH DAILY AT 6PM MONDAYS AND THURSDAYS. ON ALL OTHER DAYS, TAKE ONLY 1/2 TABLET. 01/18/18   Earl Lagos, MD    Family History Family History  Problem Relation Age of Onset  . Cancer Mother   . Hypertension Sister   . Diabetes Sister   . Hypertension Brother   . Diabetes Brother   . Hypertension Sister   . Diabetes Sister   . Colon cancer Other   . Heart Problems Other 34  sister's child, open heart surgery  . Stroke Father   . Diabetes Son   . Hypertension Son   . Kidney disease Son        on dialysis  . Hypertension Sister   . Diabetes Sister   . Hypertension Sister   . Diabetes Sister   . Cancer Brother   . Hypertension Son   . Diabetes Son   . Hypertension Daughter   . Schizophrenia Daughter     Social History Social History   Tobacco Use  . Smoking status: Former Smoker    Types: Cigarettes    Last attempt to quit: 09/18/1988    Years since quitting: 29.4  . Smokeless tobacco: Never Used  . Tobacco comment: 6 pack year smoking history as a teen  Substance Use Topics  . Alcohol use: No  . Drug use: No     Allergies   Penicillins; Cabbage; Keflex [cephalexin]; Shellfish allergy; Tomato; and Latex   Review of Systems Review of Systems  Ten systems reviewed and are negative for acute change, except as noted in the HPI.   Physical Exam Updated Vital Signs BP (!) 118/57 (BP Location: Right Arm)   Pulse 73   Temp 98.1 F (36.7 C) (Oral)   Resp 18   SpO2 99%   Physical Exam Vitals signs and nursing note  reviewed.  Constitutional:      General: She is not in acute distress.    Appearance: She is well-developed. She is not diaphoretic.  HENT:     Head: Normocephalic and atraumatic.     Mouth/Throat:     Mouth: Mucous membranes are moist.     Pharynx: Oropharynx is clear. No posterior oropharyngeal erythema.  Eyes:     General: No scleral icterus.    Conjunctiva/sclera: Conjunctivae normal.  Neck:     Musculoskeletal: Normal range of motion.  Cardiovascular:     Rate and Rhythm: Normal rate and regular rhythm.     Heart sounds: Normal heart sounds. No murmur. No friction rub. No gallop.   Pulmonary:     Effort: Pulmonary effort is normal. No respiratory distress.     Breath sounds: Normal breath sounds. No stridor. No wheezing.  Abdominal:     General: Bowel sounds are normal. There is no distension.     Palpations: Abdomen is soft. There is no mass.     Tenderness: There is no abdominal tenderness. There is no guarding.  Skin:    General: Skin is warm and dry.     Capillary Refill: Capillary refill takes less than 2 seconds.     Findings: No rash.  Neurological:     Mental Status: She is alert and oriented to person, place, and time.  Psychiatric:        Behavior: Behavior normal.      ED Treatments / Results  Labs (all labs ordered are listed, but only abnormal results are displayed) Labs Reviewed  CBG MONITORING, ED - Abnormal; Notable for the following components:      Result Value   Glucose-Capillary 101 (*)    All other components within normal limits    EKG None  Radiology No results found.  Procedures Procedures (including critical care time)  Medications Ordered in ED Medications  famotidine (PEPCID) tablet 20 mg (20 mg Oral Given 02/09/18 1750)     Initial Impression / Assessment and Plan / ED Course  I have reviewed the triage vital signs and the nursing notes.  Pertinent labs &  imaging results that were available during my care of the patient  were reviewed by me and considered in my medical decision making (see chart for details).     Patient with allergic reaction.  She has had complete resolution of her symptoms.  She took a single Benadryl and was given Pepcid here.  She has a history of diabetes and I do not think that steroids are a good option for her.  Patient was observed in the ER for 2-1/2 hours with pleat resolution of her symptoms and no recurrence.  She was seen and shared visit with Dr. Clayborne DanaMesner.  No signs or symptoms of anaphylaxis.  Patient will be discharged to follow-up with her primary care physician  Final Clinical Impressions(s) / ED Diagnoses   Final diagnoses:  Allergic reaction, initial encounter    ED Discharge Orders    None       Arthor CaptainHarris, Caren Garske, Cordelia Poche-C 02/09/18 2321    Mesner, Barbara CowerJason, MD 02/10/18 Warrick Parisian0028    Mesner, Jason, MD 02/10/18 [phone]

## 2018-02-09 NOTE — ED Triage Notes (Signed)
Pt here with c/o allergic reaction to her fish sandwich at [facility] , pt has known shellfish allergy ,  She took one benadryl before arrival

## 2018-02-12 ENCOUNTER — Other Ambulatory Visit: Payer: Self-pay | Admitting: Internal Medicine

## 2018-02-12 DIAGNOSIS — J301 Allergic rhinitis due to pollen: Secondary | ICD-10-CM

## 2018-02-20 ENCOUNTER — Other Ambulatory Visit: Payer: Self-pay

## 2018-02-20 ENCOUNTER — Ambulatory Visit (INDEPENDENT_AMBULATORY_CARE_PROVIDER_SITE_OTHER): Payer: Medicare Other | Admitting: Internal Medicine

## 2018-02-20 ENCOUNTER — Encounter: Payer: Self-pay | Admitting: Internal Medicine

## 2018-02-20 VITALS — BP 147/74 | HR 83 | Temp 98.2°F | Ht 63.0 in | Wt 273.4 lb

## 2018-02-20 DIAGNOSIS — R350 Frequency of micturition: Secondary | ICD-10-CM | POA: Diagnosis not present

## 2018-02-20 DIAGNOSIS — Z87442 Personal history of urinary calculi: Secondary | ICD-10-CM

## 2018-02-20 DIAGNOSIS — Z880 Allergy status to penicillin: Secondary | ICD-10-CM

## 2018-02-20 DIAGNOSIS — Z7901 Long term (current) use of anticoagulants: Secondary | ICD-10-CM

## 2018-02-20 DIAGNOSIS — R1030 Lower abdominal pain, unspecified: Secondary | ICD-10-CM

## 2018-02-20 DIAGNOSIS — R300 Dysuria: Secondary | ICD-10-CM

## 2018-02-20 DIAGNOSIS — R3915 Urgency of urination: Secondary | ICD-10-CM | POA: Diagnosis not present

## 2018-02-20 DIAGNOSIS — Z881 Allergy status to other antibiotic agents status: Secondary | ICD-10-CM

## 2018-02-20 HISTORY — DX: Lower abdominal pain, unspecified: R10.30

## 2018-02-20 LAB — POCT URINALYSIS DIPSTICK
Bilirubin, UA: NEGATIVE
Glucose, UA: NEGATIVE
Ketones, UA: NEGATIVE
Nitrite, UA: NEGATIVE
Protein, UA: NEGATIVE
Spec Grav, UA: 1.01 (ref 1.010–1.025)
UROBILINOGEN UA: 0.2 U/dL
pH, UA: 7 (ref 5.0–8.0)

## 2018-02-20 MED ORDER — NITROFURANTOIN MONOHYD MACRO 100 MG PO CAPS: 100.0000 mg | ORAL_CAPSULE | Freq: Every day | ORAL | 0 refills | Status: AC

## 2018-02-20 NOTE — Assessment & Plan Note (Addendum)
Ms. Kruth presents with a 2-day history of lower abdominal pain associated with dysuria and urinary frequency and urgency. She was seen in the ED for similar symptoms on 1/20 but UA was normal and no antibiotics were prescribed. Her symptoms resolved after a few days and she was doing well until 2 days ago when the above symptoms started. She denies systemic signs of infection, hematuria, itching, and vaginal discharge. She denies recent antibiotic use and she is not sexually active. Denies history of STIs. Urine dipstick in clinic was positive for leukocytes and trace-lysed blood. No nitrites. She has had kidney stones in the past, but denies flank pain and states this feels different. She also has a history of microscopic hematuria that resolved after UTI treatment. She has an allergy to penicillins and Keflex. She is also on Coumadin. Will therefore prescribe Macrobid 100 mg QD x 5 days (has normal kidney and liver function) which should not interact with concentration of Coumadin in the blood. I discussed this with Dr. Selena Batten. She has an appt in the Coumadin clinic on 2/24. I also ordered a formal UA with microscopy to check for microscopic hematuria. If present, she will need a repeat UA in 6 weeks to ensure resolution.

## 2018-02-20 NOTE — Patient Instructions (Signed)
Ms. Krahl,   I sent a prescription for the antibiotic Macrobid to your pharmacy.  You will take 1 tablet every day for a total of 5 days.  Your symptoms should resolve with this medication.  If they do not, please schedule a follow-up appointment with [place].  This medication does not interact with your Coumadin.  I spoke to Dr. Selena Batten about this to make sure.  You have an appointment at the Coumadin clinic on 2/24.  We will make sure your INR levels are okay at this appointment.  Call us if you have any questions or concerns.  -Dr. Evelene Croon

## 2018-02-20 NOTE — Progress Notes (Signed)
   CC: Lower abdominal pain    HPI:  [name] is a 73 y.o. year-old female with PMH listed below who presents to clinic for evaluation of lower abdominal pain. Please see problem based assessment and plan for further details.   Past Medical History:  Diagnosis Date  . Arthritis   . Asthma   . Bronchitis   . CHF (congestive heart failure) (HCC)   . Diabetes (HCC) 2019  . DVT, lower extremity, recurrent (HCC)    Patient had unprovoked PE on 2002 and DVT in right lower extremety 2008.  [name] GERD (gastroesophageal reflux disease)   . History of kidney stones   . Hypertension   . PE (pulmonary embolism)    Patient had unprovoked PE on 2002  . PONV (postoperative nausea and vomiting)   . Vertigo    Review of Systems:   Review of Systems  Constitutional: Negative for chills, fever and malaise/fatigue.  Gastrointestinal: Positive for abdominal pain. Negative for constipation, diarrhea, nausea and vomiting.  Genitourinary: Positive for dysuria, frequency and urgency. Negative for hematuria.    Physical Exam: Vitals:   02/20/18 1028  BP: (!) 147/74  Pulse: 83  Temp: 98.2 F (36.8 C)  TempSrc: Oral  SpO2: 98%  Weight: 273 lb 6.4 oz (124 kg)  Height: 5\' 3"  (1.6 m)    General: well-appearing female, obese, in no acute distress  Cardiac: regular rate and rhythm, nl S1/S2, no murmurs, rubs or gallops Abd: abdomen is obese but soft and non-distended. There is suprapubic tenderness. Bowel sounds are normoactive.  Ext: warm and well perfused, no peripheral edema   Assessment & Plan:   See Encounters Tab for problem based charting.  Patient discussed with Dr. Josem [name]

## 2018-02-21 LAB — URINALYSIS, ROUTINE W REFLEX MICROSCOPIC
Bilirubin, UA: NEGATIVE
Glucose, UA: NEGATIVE
Ketones, UA: NEGATIVE
NITRITE UA: NEGATIVE
Protein, UA: NEGATIVE
RBC UA: NEGATIVE
Specific Gravity, UA: <=1.005 — AB (ref 1.005–1.030)
Urobilinogen, Ur: 0.2 mg/dL (ref 0.2–1.0)
pH, UA: 6.5 (ref 5.0–7.5)

## 2018-02-21 LAB — MICROSCOPIC EXAMINATION
Casts: NONE SEEN /lpf
RBC MICROSCOPIC, UA: NONE SEEN /HPF (ref 0–2)

## 2018-02-21 NOTE — Progress Notes (Signed)
Case discussed with Dr. Santos-Sanchez at the time of the visit.  We reviewed the resident's history and exam and pertinent patient test results.  I agree with the assessment, diagnosis and plan of care documented in the resident's note. 

## 2018-02-26 ENCOUNTER — Ambulatory Visit (INDEPENDENT_AMBULATORY_CARE_PROVIDER_SITE_OTHER): Payer: Medicare Other | Admitting: Pharmacist

## 2018-02-26 ENCOUNTER — Encounter: Payer: Self-pay | Admitting: Pharmacist

## 2018-02-26 DIAGNOSIS — Z86718 Personal history of other venous thrombosis and embolism: Secondary | ICD-10-CM | POA: Diagnosis not present

## 2018-02-26 DIAGNOSIS — Z5181 Encounter for therapeutic drug level monitoring: Secondary | ICD-10-CM | POA: Diagnosis not present

## 2018-02-26 DIAGNOSIS — I82409 Acute embolism and thrombosis of unspecified deep veins of unspecified lower extremity: Secondary | ICD-10-CM | POA: Diagnosis not present

## 2018-02-26 DIAGNOSIS — Z7901 Long term (current) use of anticoagulants: Secondary | ICD-10-CM | POA: Diagnosis not present

## 2018-02-26 LAB — POCT INR: INR: 2.4 (ref 2.0–3.0)

## 2018-02-26 NOTE — Progress Notes (Signed)
Anticoagulation Management Caroline [name] is a 73 y.o. female who reports to the clinic for monitoring of warfarin treatment.    Indication: DVT, lower extremity, recurrent (History of); Long term current use of anticoagulant.   Duration: indefinite Supervising physician: Carlynn Purl  Anticoagulation Clinic Visit History: Patient does not report signs/symptoms of bleeding or thromboembolism  Other recent changes: No diet, medications, lifestyle changes other than as noted in patient findings.  Anticoagulation Episode Summary    Current INR goal:   2.0-3.0  TTR:   80.2 % (3.1 y)  Next INR check:   03/26/2018  INR from last check:   2.4 (02/26/2018)  Weekly max warfarin dose:     Target end date:     INR check location:   Anticoagulation Clinic  Preferred lab:     Send INR reminders to:   [facility]   Indications   DVT lower extremity recurrent (HCC) [I82.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 Penicillins Anaphylaxis, Hives, Swelling and Rash    Has patient had a PCN reaction causing immediate rash, facial/tongue/throat swelling, SOB or lightheadedness with hypotension: Yes Has patient had a PCN reaction causing severe rash involving mucus membranes or skin necrosis: Yes Has patient had a PCN reaction that required hospitalization Yes Has patient had a PCN reaction occurring within the last 10 years: No If all of the above answers are "NO", then may proceed with Cephalosporin use.   . Cabbage Itching  . Keflex [Cephalexin] Hives    And feeling of throat tightness  . Shellfish Allergy Swelling  . Tomato Swelling  . Latex Itching and Rash   Prior to Admission medications   Medication Sig Start Date End Date Taking? Authorizing Provider  budesonide-formoterol (SYMBICORT) 160-4.5 MCG/ACT inhaler Inhale 2 puffs into the lungs 2 (two) times daily. 08/29/17  Yes Molt, Bethany, DO  calcium  citrate-vitamin D (CITRACAL+D) 315-200 MG-UNIT tablet Take 2 tablets by mouth daily. 12/14/17  Yes Earl Lagos, MD  carvedilol (COREG) 12.5 MG tablet Take 1 tablet (12.5 mg total) by mouth 2 (two) times daily. 01/30/18 04/30/18 Yes Earl Lagos, MD  fluticasone (FLONASE) 50 MCG/ACT nasal spray PLACE 2 SPRAYS DAILY INTO BOTH NOSTRILS. 02/13/18  Yes Earl Lagos, MD  furosemide (LASIX) 40 MG tablet Take 1 tablet (40 mg total) by mouth every other day. 12/15/17  Yes Earl Lagos, MD  hydrocortisone (PROCTOSOL HC) 2.5 % rectal cream Place 1 application rectally 2 (two) times daily. 10/26/17  Yes Earl Lagos, MD  losartan (COZAAR) 100 MG tablet Take 1 tablet (100 mg total) by mouth daily. 01/30/18  Yes Earl Lagos, MD  magnesium oxide (MAG-OX) 400 MG tablet Take 1 tablet (400 mg total) by mouth 2 (two) times daily. Please make overdue appt with Dr. Elberta Fortis before anymore refills. 1st attempt Patient taking differently: Take 400 mg by mouth 2 (two) times daily.  01/17/18  Yes Camnitz, Will [name], MD  meclizine (ANTIVERT) 25 MG tablet Take 1 tablet (25 mg total) by mouth 3 (three) times daily as needed. Patient taking differently: Take 25 mg by mouth 3 (three) times daily as needed for dizziness or nausea.  10/30/17  Yes Seawell, Jaimie A, DO  metFORMIN (GLUCOPHAGE-XR) 500 MG 24 hr tablet TAKE 1 TABLET BY MOUTH EVERY DAY WITH BREAKFAST 01/30/18  Yes Narendra, Nischal, MD  montelukast (SINGULAIR) 10 MG tablet Take 1 tablet (  10 mg total) by mouth at bedtime. 08/29/17  Yes Molt, Bethany, DO  pantoprazole (PROTONIX) 40 MG tablet Take 1 tablet (40 mg total) by mouth daily. 01/30/18  Yes Earl LagosNarendra, Nischal, MD  phenol (CHLORASEPTIC) 1.4 % LIQD Use as directed 2 sprays in the mouth or throat as needed for throat irritation / pain. 10/30/17  Yes Seawell, Jaimie A, DO  PROAIR HFA 108 (90 Base) MCG/ACT inhaler TAKE 2 PUFFS BY MOUTH EVERY 6 HOURS AS NEEDED FOR WHEEZE OR SHORTNESS OF BREATH 01/30/18   Yes Narendra, Nischal, MD  RESTASIS 0.05 % ophthalmic emulsion PLACE 1 DROP INTO BOTH EYES 2 (TWO) TIMES DAILY. USE ONCE DAILY Patient taking differently: Place 1 drop into both eyes 2 (two) times daily.  08/29/17  Yes Molt, Bethany, DO  RESTASIS 0.05 % ophthalmic emulsion PLACE 1 DROP INTO BOTH EYES 2 (TWO) TIMES DAILY. USE ONCE DAILY 11/15/17  Yes Gust RungHoffman, Erik C, DO  rosuvastatin (CRESTOR) 20 MG tablet Take 1 tablet (20 mg total) by mouth daily. 01/30/18  Yes Earl LagosNarendra, Nischal, MD  warfarin (COUMADIN) 4 MG tablet TAKE 1 TAB BY MOUTH DAILY AT 6PM MONDAYS AND THURSDAYS. ON ALL OTHER DAYS, TAKE ONLY 1/2 TABLET. Patient taking differently: Take 2-4 mg by mouth See admin instructions. TAKE 1 TAB BY MOUTH DAILY AT 6PM MONDAYS AND THURSDAYS. ON ALL OTHER DAYS, TAKE ONLY 1/2 TABLET. 01/18/18  Yes Narendra, Nischal, MD  EPINEPHrine 0.3 mg/0.3 mL IJ SOAJ injection Inject 0.3 mg into the muscle as needed. Severe allergic reactions 10/30/17   [provider]   Past Medical History:  Diagnosis Date  . Arthritis   . Asthma   . Bronchitis   . CHF (congestive heart failure) (HCC)   . Diabetes (HCC) 2019  . DVT, lower extremity, recurrent (HCC)    Patient had unprovoked PE on 2002 and DVT in right lower extremety 2008.  Caroline [name]. GERD (gastroesophageal reflux disease)   . History of kidney stones   . Hypertension   . PE (pulmonary embolism)    Patient had unprovoked PE on 2002  . PONV (postoperative nausea and vomiting)   . Vertigo    Social History   Socioeconomic History  . Marital status: Divorced    Spouse name: Not on file  . Number of children: 5  . Years of education: 9 th grade  . Highest education level: 9th grade  Occupational History  . Occupation: Merchandiser, retailretail  Social Needs  . Financial resource strain: Somewhat hard  . Food insecurity:    Worry: Never true    Inability: Sometimes true  . Transportation needs:    Medical: No    Non-medical: Yes  Tobacco Use  . Smoking status: Former  Smoker    Types: Cigarettes    Last attempt to quit: 09/18/1988    Years since quitting: 29.4  . Smokeless tobacco: Never Used  . Tobacco comment: 6 pack year smoking history as a teen  Substance and Sexual Activity  . Alcohol use: No  . Drug use: No  . Sexual activity: Never    Birth control/protection: None  Lifestyle  . Physical activity:    Days per week: 2 days    Minutes per session: 20 min  . Stress: To some extent  Relationships  . Social connections:    Talks on phone: More than three times a week    Gets together: Once a week    Attends religious service: More than 4 times per year    Active  member of club or organization: No    Attends meetings of clubs or organizations: Never    Relationship status: Divorced  Other Topics Concern  . Not on file  Social History Narrative   Current Social History 05/17/2017        Patient lives with 60 yo granddaughter" in a/an home / condo / townhome which is 1 story/stories. There are not steps up to the entrance the patient uses.       Patient's method of transportation is church member.      The highest level of education was some high school.      The patient currently retired.      Identified important Relationships are God, family       Pets : 1 lab/pitt mix named Cinnamon       Interests / Fun: Church,TV       Current Stressors: "Me and my granddaughter"       Religious / Personal Beliefs: I'm Holiness and I love people"       Other: "I'd give away my last dime."    Family History  Problem Relation Age of Onset  . Cancer Mother   . Hypertension Sister   . Diabetes Sister   . Hypertension Brother   . Diabetes Brother   . Hypertension Sister   . Diabetes Sister   . Colon cancer Other   . Heart Problems Other 34       sister's child, open heart surgery  . Stroke Father   . Diabetes Son   . Hypertension Son   . Kidney disease Son        on dialysis  . Hypertension Sister   . Diabetes Sister   .  Hypertension Sister   . Diabetes Sister   . Cancer Brother   . Hypertension Son   . Diabetes Son   . Hypertension Daughter   . Schizophrenia Daughter     ASSESSMENT Recent Results: The most recent result is correlated with 20 mg per week: Lab Results  Component Value Date   INR 2.4 02/26/2018   INR 2.0 02/05/2018   INR 1.8 (A) 01/15/2018    Anticoagulation Dosing: Description   Take only one-half (1/2) of your 4mg  blue-colored warfarin tablets by mouth, once-daily at 6PM each day--EXCEPT on  MONDAYS, WEDNESDAYS and FRIDAYS, take one (1) tablet of your 4mg  blue-colored warfarin tablets on MONDAYS, WEDNESDAYS and FRIDAYS.       INR today: Therapeutic  PLAN Weekly dose was unchanged. Will remain on 20mg  per week.   Patient Instructions  Patient instructed to take medications as defined in the Anti-coagulation Track section of this encounter.  Patient instructed to take today's dose.  Patient instructed to take  only one-half (1/2) of your 4mg  blue-colored warfarin tablets by mouth, once-daily at 6PM each day--EXCEPT on  MONDAYS, WEDNESDAYS and FRIDAYS, take one (1) tablet of your 4mg  blue-colored warfarin tablets on MONDAYS, WEDNESDAYS and FRIDAYS. Patient verbalized understanding of these instructions.     Patient advised to contact clinic or seek medical attention if signs/symptoms of bleeding or thromboembolism occur.  Patient verbalized understanding by repeating back information and was advised to contact me if further medication-related questions arise. Patient was also provided an information handout.  Follow-up Return in about 4 weeks (around 03/26/2018) for Follow up INR at 1030h.  [employer] Remell Giaimo  15 minutes spent face-to-face with the patient during the encounter. 50% of time spent on education, including signs/sx bleeding and clotting,  as well as food and drug interactions with warfarin. 50% of time was spent on fingerprick POC INR sample collection,processing,  results determination, and documentation in [email or web address].

## 2018-02-26 NOTE — Patient Instructions (Signed)
Patient instructed to take medications as defined in the Anti-coagulation Track section of this encounter.  Patient instructed to take today's dose.  Patient instructed to take  only one-half (1/2) of your 4mg  blue-colored warfarin tablets by mouth, once-daily at 6PM each day--EXCEPT on  MONDAYS, WEDNESDAYS and FRIDAYS, take one (1) tablet of your 4mg  blue-colored warfarin tablets on MONDAYS, WEDNESDAYS and FRIDAYS. Patient verbalized understanding of these instructions.

## 2018-03-05 ENCOUNTER — Ambulatory Visit: Payer: Medicare Other

## 2018-03-05 ENCOUNTER — Encounter: Payer: Self-pay | Admitting: Cardiology

## 2018-03-05 ENCOUNTER — Ambulatory Visit (INDEPENDENT_AMBULATORY_CARE_PROVIDER_SITE_OTHER): Payer: Medicare Other | Admitting: Cardiology

## 2018-03-05 VITALS — BP 122/62 | HR 78 | Ht 63.0 in | Wt 272.0 lb

## 2018-03-05 DIAGNOSIS — R002 Palpitations: Secondary | ICD-10-CM | POA: Diagnosis not present

## 2018-03-05 DIAGNOSIS — I10 Essential (primary) hypertension: Secondary | ICD-10-CM | POA: Diagnosis not present

## 2018-03-05 MED ORDER — LOSARTAN POTASSIUM 100 MG PO TABS: 100.0000 mg | ORAL_TABLET | Freq: Every day | ORAL | 2 refills | Status: DC

## 2018-03-05 MED ORDER — CARVEDILOL 12.5 MG PO TABS: 12.5000 mg | ORAL_TABLET | Freq: Two times a day (BID) | ORAL | 2 refills | Status: DC

## 2018-03-05 MED ORDER — MAGNESIUM OXIDE 400 MG PO TABS: 400.0000 mg | ORAL_TABLET | Freq: Two times a day (BID) | ORAL | 3 refills | Status: DC

## 2018-03-05 NOTE — Patient Instructions (Signed)
Medication Instructions:  Your physician recommends that you continue on your current medications as directed. Please refer to the Current Medication list given to you today.  * If you need a refill on your cardiac medications before your next appointment, please call your pharmacy.   Labwork: None ordered  Testing/Procedures: None ordered  Follow-Up: Your physician wants you to follow-up in: 1 year with Dr. Camnitz.  You will receive a reminder letter in the mail two months in advance. If you don't receive a letter, please call our office to schedule the follow-up appointment.  Thank you for choosing [facility]!!   Terre Hanneman, RN [phone]        

## 2018-03-05 NOTE — Progress Notes (Signed)
Electrophysiology Office Note   Date:  03/05/2018   ID:  [name], [name] 11/11/1945, MRN [id number]  PCP:  Earl Lagos, MD  Cardiologist:  Regan Lemming, MD    No chief complaint on file.    History of Present Illness: Caroline [name] is a 73 y.o. female who presents today for electrophysiology evaluation.   S/p hospital admission from 09/16/15-09/23/15 with klebsiella pneumonia sepsis and right ureteral calculi. She was started on iv fluids and iv antibiotics. She underwent cystoscopy and right retrograde ureterogram and had right ureteral stent placed. She was followed by urology. She has PMH of pulmonary embolism, HTN, combined CHF.   Today, denies symptoms of palpitations, chest pain, shortness of breath, orthopnea, PND, lower extremity edema, claudication, dizziness, presyncope, syncope, bleeding, or neurologic sequela. The patient is tolerating medications without difficulties.  Overall she is feeling well.  She has no chest pain or shortness of breath.  She is able to do all of her daily activities.  She is working to lose weight.  She is walking quite a bit more and is feeling well with less weight.  Past Medical History:  Diagnosis Date  . Arthritis   . Asthma   . Bronchitis   . CHF (congestive heart failure) (HCC)   . Diabetes (HCC) 2019  . DVT, lower extremity, recurrent (HCC)    Patient had unprovoked PE on 2002 and DVT in right lower extremety 2008.  [name] GERD (gastroesophageal reflux disease)   . History of kidney stones   . Hypertension   . PE (pulmonary embolism)    Patient had unprovoked PE on 2002  . PONV (postoperative nausea and vomiting)   . Vertigo    Past Surgical History:  Procedure Laterality Date  . ABDOMINAL HYSTERECTOMY     1980  . CYSTOSCOPY W/ URETERAL STENT PLACEMENT Right 09/16/2015   Procedure: CYSTOSCOPY WITH RETROGRADE PYELOGRAM/URETERAL STENT PLACEMENT;  Surgeon: Alfredo Martinez, MD;  Location: [facility];  Service: Urology;  Laterality:  Right;  . CYSTOSCOPY WITH RETROGRADE PYELOGRAM, URETEROSCOPY AND STENT PLACEMENT Right 12/11/2015   Procedure: RIGHT URETEROSCOPY/HOLMIUM LASER LITHOTRIPSY AND STONE REMOVAL removal and placement of double j stent;  Surgeon: Crist Fat, MD;  Location: [facility];  Service: Urology;  Laterality: Right;  . HOLMIUM LASER APPLICATION Right 12/11/2015   Procedure: HOLMIUM LASER APPLICATION;  Surgeon: Crist Fat, MD;  Location: [facility];  Service: Urology;  Laterality: Right;     Current Outpatient Medications  Medication Sig Dispense Refill  . budesonide-formoterol (SYMBICORT) 160-4.5 MCG/ACT inhaler Inhale 2 puffs into the lungs 2 (two) times daily. 3 Inhaler 4  . calcium citrate-vitamin D (CITRACAL+D) 315-200 MG-UNIT tablet Take 2 tablets by mouth daily. 100 tablet 3  . carvedilol (COREG) 12.5 MG tablet Take 1 tablet (12.5 mg total) by mouth 2 (two) times daily. 180 tablet 2  . EPINEPHrine 0.3 mg/0.3 mL IJ SOAJ injection Inject 0.3 mg into the muscle as needed. Severe allergic reactions  0  . fluticasone (FLONASE) 50 MCG/ACT nasal spray PLACE 2 SPRAYS DAILY INTO BOTH NOSTRILS. 16 g 1  . furosemide (LASIX) 40 MG tablet Take 1 tablet (40 mg total) by mouth every other day. 90 tablet 1  . hydrocortisone (PROCTOSOL HC) 2.5 % rectal cream Place 1 application rectally 2 (two) times daily. 30 g 0  . losartan (COZAAR) 100 MG tablet Take 1 tablet (100 mg total) by mouth daily. 90 tablet 2  . magnesium oxide (MAG-OX) 400 MG tablet Take  1 tablet (400 mg total) by mouth 2 (two) times daily. 90 tablet 3  . meclizine (ANTIVERT) 25 MG tablet Take 1 tablet (25 mg total) by mouth 3 (three) times daily as needed. (Patient taking differently: Take 25 mg by mouth 3 (three) times daily as needed for dizziness or nausea. ) 90 tablet 1  . metFORMIN (GLUCOPHAGE-XR) 500 MG 24 hr tablet TAKE 1 TABLET BY MOUTH EVERY DAY WITH BREAKFAST 90 tablet 1  . montelukast (SINGULAIR) 10 MG tablet Take 1 tablet (10 mg total) by  mouth at bedtime. 90 tablet 3  . pantoprazole (PROTONIX) 40 MG tablet Take 1 tablet (40 mg total) by mouth daily. 90 tablet 1  . phenol (CHLORASEPTIC) 1.4 % LIQD Use as directed 2 sprays in the mouth or throat as needed for throat irritation / pain. 29 mL 0  . PROAIR HFA 108 (90 Base) MCG/ACT inhaler TAKE 2 PUFFS BY MOUTH EVERY 6 HOURS AS NEEDED FOR WHEEZE OR SHORTNESS OF BREATH 8.5 Inhaler 2  . RESTASIS 0.05 % ophthalmic emulsion PLACE 1 DROP INTO BOTH EYES 2 (TWO) TIMES DAILY. USE ONCE DAILY (Patient taking differently: Place 1 drop into both eyes 2 (two) times daily. ) 60 mL 0  . rosuvastatin (CRESTOR) 20 MG tablet Take 1 tablet (20 mg total) by mouth daily. 90 tablet 3  . warfarin (COUMADIN) 4 MG tablet TAKE 1 TAB BY MOUTH DAILY AT 6PM MONDAYS AND THURSDAYS. ON ALL OTHER DAYS, TAKE ONLY 1/2 TABLET. (Patient taking differently: Take 2-4 mg by mouth See admin instructions. TAKE 1 TAB BY MOUTH DAILY AT 6PM MONDAYS AND THURSDAYS. ON ALL OTHER DAYS, TAKE ONLY 1/2 TABLET.) 93 tablet 0   No current facility-administered medications for this visit.     Allergies:   Penicillins; Cabbage; Keflex [cephalexin]; Shellfish allergy; Tomato; and Latex   Social History:  The patient  reports that she quit smoking about 29 years ago. Her smoking use included cigarettes. She has never used smokeless tobacco. She reports that she does not drink alcohol or use drugs.   Family History:  The patient's family history includes CVA in her father; Cancer in her brother and mother; Colon cancer in an other family member; Diabetes in her brother, sister, sister, sister, sister, son, and son; Heart Problems (age of onset: 46) in an other family member; Hypertension in her brother, daughter, sister, sister, sister, sister, son, and son; Kidney disease in her son; Schizophrenia in her daughter.   ROS:  Please see the history of present illness.   Otherwise, review of systems is positive for wheezing, cough, SOB.   All other  systems are reviewed and negative.   PHYSICAL EXAM: VS:  BP 122/62   Pulse 78   Ht 5\' 3"  (1.6 m)   Wt 272 lb (123.4 kg)   BMI 48.18 kg/m  , BMI Body mass index is 48.18 kg/m. GEN: Well nourished, well developed, in no acute distress  HEENT: normal  Neck: no JVD, carotid bruits, or masses Cardiac: RRR; no murmurs, rubs, or gallops,no edema  Respiratory:  clear to auscultation bilaterally, normal work of breathing GI: soft, nontender, nondistended, + BS MS: no deformity or atrophy  Skin: warm and dry Neuro:  Strength and sensation are intact Psych: euthymic mood, full affect  EKG:  EKG is ordered today. Personal review of the ekg ordered shows sinus rhythm, right bundle branch block, left anterior fascicular block  PAD Screen 10/28/2015  Previous PAD dx? No  Previous surgical procedure? Yes  Dates of procedures 09/16/15  Pain with walking? No  Feet/toe relief with dangling? No  Painful, non-healing ulcers? No  Extremities discolored? No      Recent Labs: 09/18/2017: ALT 17; BUN 9; Creatinine, Ser 0.88; Hemoglobin 10.6; Platelets 230; Potassium 3.9; Sodium 143    Lipid Panel     Component Value Date/Time   CHOL 151 08/29/2017 1346   TRIG 112 08/29/2017 1346   HDL 47 08/29/2017 1346   CHOLHDL 3.2 08/29/2017 1346   CHOLHDL 4.9 04/08/2014 1353   VLDL 44 (H) 04/08/2014 1353   LDLCALC 82 08/29/2017 1346     Wt Readings from Last 3 Encounters:  03/05/18 272 lb (123.4 kg)  02/20/18 273 lb 6.4 oz (124 kg)  01/30/18 275 lb 3.2 oz (124.8 kg)      Other studies Reviewed: Additional studies/ records that were reviewed today include: TTE 09/21/15  Review of the above records today demonstrates:  - Left ventricle: The cavity size was normal. Systolic function was   mildly reduced. The estimated ejection fraction was in the range   of 45% to 50%. Wall motion was normal; there were no regional   wall motion abnormalities. Features are consistent with a   pseudonormal left  ventricular filling pattern, with concomitant   abnormal relaxation and increased filling pressure (grade 2   diastolic dysfunction). - Left atrium: The atrium was moderately dilated.  30 day monitor 09/06/16 - personally reviewed Sinus rhythm, no arrhythmias noted.  ASSESSMENT AND PLAN:  1.  Chronic combined systolic and diastolic HF: Doing well without complaints or volume overload.  Currently on carvedilol and losartan.  Has follow-up with heart failure in August.    2. Hypertension: Well-controlled today.  No changes.  3. Palpitations: 30-day monitor without arrhythmia.  Has had no further palpitations.  No changes.   Current medicines are reviewed at length with the patient today.   The patient does not have concerns regarding her medicines.  The following changes were made today: None  Labs/ tests ordered today include:  Orders Placed This Encounter  Procedures  . EKG 12-Lead     Disposition:   FU with Caroline [name] with 12 months  Signed, Caroline [name] Jorja Loa, MD  03/05/2018 11:44 AM     [facility] [address] [place] [address] [phone] (office) [phone] (fax)

## 2018-03-07 ENCOUNTER — Ambulatory Visit: Payer: Medicare Other | Admitting: Licensed Clinical Social Worker

## 2018-03-26 ENCOUNTER — Ambulatory Visit (INDEPENDENT_AMBULATORY_CARE_PROVIDER_SITE_OTHER): Payer: Medicare Other | Admitting: Pharmacist

## 2018-03-26 ENCOUNTER — Encounter: Payer: Self-pay | Admitting: Pharmacist

## 2018-03-26 ENCOUNTER — Other Ambulatory Visit: Payer: Self-pay

## 2018-03-26 DIAGNOSIS — Z7901 Long term (current) use of anticoagulants: Secondary | ICD-10-CM

## 2018-03-26 DIAGNOSIS — E118 Type 2 diabetes mellitus with unspecified complications: Secondary | ICD-10-CM

## 2018-03-26 DIAGNOSIS — Z86711 Personal history of pulmonary embolism: Secondary | ICD-10-CM | POA: Diagnosis not present

## 2018-03-26 DIAGNOSIS — I82409 Acute embolism and thrombosis of unspecified deep veins of unspecified lower extremity: Secondary | ICD-10-CM | POA: Diagnosis not present

## 2018-03-26 DIAGNOSIS — Z5181 Encounter for therapeutic drug level monitoring: Secondary | ICD-10-CM | POA: Diagnosis not present

## 2018-03-26 LAB — POCT GLYCOSYLATED HEMOGLOBIN (HGB A1C): Hemoglobin A1C: 6.1 % — AB (ref 4.0–5.6)

## 2018-03-26 LAB — GLUCOSE, CAPILLARY: Glucose-Capillary: 118 mg/dL — ABNORMAL HIGH (ref 70–99)

## 2018-03-26 LAB — POCT INR: INR: 2.2 (ref 2.0–3.0)

## 2018-03-26 NOTE — Patient Instructions (Signed)
Patient instructed to take medications as defined in the Anti-coagulation Track section of this encounter.  Patient instructed to take today's dose.  Patient instructed to take  only one-half (1/2) of your 4mg blue-colored warfarin tablets by mouth, once-daily at 6PM each day--EXCEPT on  MONDAYS, WEDNESDAYS and FRIDAYS, take one (1) tablet of your 4mg blue-colored warfarin tablets on MONDAYS, WEDNESDAYS and FRIDAYS. Patient verbalized understanding of these instructions.    

## 2018-03-26 NOTE — Progress Notes (Signed)
Mr. Ocejo requested hemoglobin A1C be done while she is here.

## 2018-03-26 NOTE — Progress Notes (Signed)
Anticoagulation Management Caroline [name] is a 73 y.o. female who reports to the clinic for monitoring of warfarin treatment.    Indication: DVT, History of; Long-term (current) use of anticoagulant.   Duration: indefinite Supervising physician: Carlynn Purl  Anticoagulation Clinic Visit History: Patient does not report signs/symptoms of bleeding or thromboembolism  Other recent changes: No diet, medications, lifestyle changes.  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 Penicillins Anaphylaxis, Hives, Swelling and Rash    Has patient had a PCN reaction causing immediate rash, facial/tongue/throat swelling, SOB or lightheadedness with hypotension: Yes Has patient had a PCN reaction causing severe rash involving mucus membranes or skin necrosis: Yes Has patient had a PCN reaction that required hospitalization Yes Has patient had a PCN reaction occurring within the last 10 years: No If all of the above answers are "NO", then may proceed with Cephalosporin use.   . Cabbage Itching  . Keflex [Cephalexin] Hives    And feeling of throat tightness  . Shellfish Allergy Swelling  . Tomato Swelling  . Latex Itching and Rash   Prior to Admission medications   Medication Sig Start Date End Date Taking? Authorizing Provider  budesonide-formoterol (SYMBICORT) 160-4.5 MCG/ACT inhaler Inhale 2 puffs into the lungs 2 (two) times daily. 08/29/17  Yes Molt, Bethany, DO  calcium citrate-vitamin D (CITRACAL+D) 315-200 MG-UNIT tablet Take 2 tablets by mouth  daily. 12/14/17  Yes Earl Lagos, MD  carvedilol (COREG) 12.5 MG tablet Take 1 tablet (12.5 mg total) by mouth 2 (two) times daily. 03/05/18 06/03/18 Yes Camnitz, Will [name], MD  EPINEPHrine 0.3 mg/0.3 mL IJ SOAJ injection Inject 0.3 mg into the muscle as needed. Severe allergic reactions 10/30/17  Yes [provider]  fluticasone (FLONASE) 50 MCG/ACT nasal spray PLACE 2 SPRAYS DAILY INTO BOTH NOSTRILS. 02/13/18  Yes Earl Lagos, MD  furosemide (LASIX) 40 MG tablet Take 1 tablet (40 mg total) by mouth every other day. 12/15/17  Yes Earl Lagos, MD  hydrocortisone (PROCTOSOL HC) 2.5 % rectal cream Place 1 application rectally 2 (two) times daily. 10/26/17  Yes Earl Lagos, MD  losartan (COZAAR) 100 MG tablet Take 1 tablet (100 mg total) by mouth daily. 03/05/18  Yes Camnitz, Andree Coss, MD  magnesium oxide (MAG-OX) 400 MG tablet Take 1 tablet (400 mg total) by mouth 2 (two) times daily. 03/05/18  Yes Camnitz, Will [name], MD  meclizine (ANTIVERT) 25 MG tablet Take 1 tablet (25 mg total) by mouth 3 (three) times daily as needed. Patient taking differently: Take 25 mg by mouth 3 (three) times daily as needed for dizziness or nausea.  10/30/17  Yes Seawell, Jaimie A, DO  metFORMIN (GLUCOPHAGE-XR) 500 MG 24 hr tablet TAKE 1 TABLET BY MOUTH EVERY DAY WITH BREAKFAST 01/30/18  Yes Earl Lagos, MD  montelukast (SINGULAIR) 10 MG tablet Take 1 tablet (10 mg total) by mouth at bedtime. 08/29/17  Yes Molt, Bethany, DO  pantoprazole (PROTONIX) 40 MG tablet Take 1 tablet (40 mg total) by mouth daily. 01/30/18  Yes Earl Lagos, MD  phenol (CHLORASEPTIC) 1.4 % LIQD Use as directed 2 sprays in the mouth or throat as needed for throat irritation / pain. 10/30/17  Yes Seawell, Jaimie A, DO  PROAIR HFA 108 (90 Base) MCG/ACT inhaler TAKE 2 PUFFS BY MOUTH EVERY 6 HOURS AS NEEDED FOR WHEEZE OR SHORTNESS OF BREATH 01/30/18  Yes Narendra, Nischal, MD  RESTASIS 0.05 % ophthalmic emulsion PLACE  1 DROP INTO BOTH EYES 2 (TWO) TIMES DAILY. USE ONCE DAILY Patient taking differently: Place 1 drop into both eyes 2 (two) times daily.  08/29/17  Yes Molt, Bethany, DO  rosuvastatin (CRESTOR) 20 MG tablet Take 1 tablet (20 mg total) by mouth daily. 01/30/18  Yes Earl Lagos, MD  warfarin (COUMADIN) 4 MG tablet TAKE 1 TAB BY MOUTH DAILY AT 6PM MONDAYS AND THURSDAYS. ON ALL OTHER DAYS, TAKE ONLY 1/2 TABLET. Patient taking differently: Take 2-4 mg by mouth See admin instructions. TAKE 1 TAB BY MOUTH DAILY AT 6PM MONDAYS AND THURSDAYS. ON ALL OTHER DAYS, TAKE ONLY 1/2 TABLET. 01/18/18  Yes Earl Lagos, MD   Past Medical History:  Diagnosis Date  . Arthritis   . Asthma   . Bronchitis   . CHF (congestive heart failure) (HCC)   . Diabetes (HCC) 2019  . DVT, lower extremity, recurrent (HCC)    Patient had unprovoked PE on 2002 and DVT in right lower extremety 2008.  [name] GERD (gastroesophageal reflux disease)   . History of kidney stones   . Hypertension   . PE (pulmonary embolism)    Patient had unprovoked PE on 2002  . PONV (postoperative nausea and vomiting)   . Vertigo    Social History   Socioeconomic History  . Marital status: Divorced    Spouse name: Not on file  . Number of children: 5  . Years of education: 9 th grade  . Highest education level: 9th grade  Occupational History  . Occupation: Merchandiser, retail  . Financial resource strain: Somewhat hard  . Food insecurity:    Worry: Never true    Inability: Sometimes true  . Transportation needs:    Medical: No    Non-medical: Yes  Tobacco Use  . Smoking status: Former Smoker    Types: Cigarettes    Last attempt to quit: 09/18/1988    Years since quitting: 29.5  . Smokeless tobacco: Never Used  . Tobacco comment: 6 pack year smoking history as a teen  Substance and Sexual Activity  . Alcohol use: No  . Drug use: No  . Sexual activity: Never    Birth control/protection: None  Lifestyle  . Physical activity:     Days per week: 2 days    Minutes per session: 20 min  . Stress: To some extent  Relationships  . Social connections:    Talks on phone: More than three times a week    Gets together: Once a week    Attends religious service: More than 4 times per year    Active member of club or organization: No    Attends meetings of clubs or organizations: Never    Relationship status: Divorced  Other Topics Concern  . Not on file  Social History Narrative   Current Social History 05/17/2017        Patient lives with 13 yo granddaughter" in a/an home / condo /  townhome which is 1 story/stories. There are not steps up to the entrance the patient uses.       Patient's method of transportation is church member.      The highest level of education was some high school.      The patient currently retired.      Identified important Relationships are God, family       Pets : 1 lab/pitt mix named Cinnamon       Interests / Fun: Church,TV       Current Stressors: "Me and my granddaughter"       Religious / Personal Beliefs: I'm Holiness and I love people"       Other: "I'd give away my last dime."    Family History  Problem Relation Age of Onset  . Cancer Mother   . Hypertension Sister   . Diabetes Sister   . Hypertension Brother   . Diabetes Brother   . Hypertension Sister   . Diabetes Sister   . Colon cancer Other   . Heart Problems Other 34       sister's child, open heart surgery  . CVA Father   . Diabetes Son   . Hypertension Son   . Kidney disease Son        on dialysis  . Hypertension Sister   . Diabetes Sister   . Hypertension Sister   . Diabetes Sister   . Cancer Brother   . Hypertension Son   . Diabetes Son   . Hypertension Daughter   . Schizophrenia Daughter     ASSESSMENT Recent Results: The most recent result is correlated with 20 mg per week: Lab Results  Component Value Date   INR 2.2 03/26/2018   INR 2.4 02/26/2018   INR 2.0 02/05/2018     Anticoagulation Dosing: Description   Take only one-half (1/2) of your 4mg  blue-colored warfarin tablets by mouth, once-daily at 6PM each day--EXCEPT on  MONDAYS, WEDNESDAYS and FRIDAYS, take one (1) tablet of your 4mg  blue-colored warfarin tablets on MONDAYS, WEDNESDAYS and FRIDAYS.       INR today: Therapeutic  PLAN Weekly dose was unchanged. Will remain on 20mg  warfarin per week.   Patient Instructions  Patient instructed to take medications as defined in the Anti-coagulation Track section of this encounter.  Patient instructed to take today's dose.  Patient instructed to take only one-half (1/2) of your 4mg  blue-colored warfarin tablets by mouth, once-daily at 6PM each day--EXCEPT on  MONDAYS, WEDNESDAYS and FRIDAYS, take one (1) tablet of your 4mg  blue-colored warfarin tablets on MONDAYS, WEDNESDAYS and FRIDAYS. Patient verbalized understanding of these instructions.     Patient advised to contact clinic or seek medical attention if signs/symptoms of bleeding or thromboembolism occur.  Patient verbalized understanding by repeating back information and was advised to contact me if further medication-related questions arise. Patient was also provided an information handout.  Follow-up Return in 5 weeks (on 04/30/2018) for Follow up INR at [facility] [address].  Elicia Lamp, PharmD, CPP  15 minutes spent face-to-face with the patient during the encounter. 50% of time spent on education, including signs/sx bleeding and clotting, as well as food and drug interactions with warfarin. 50% of time was spent on fingerprick POC INR sample collection,processing, results determination, and documentation in [email or web address].

## 2018-03-27 ENCOUNTER — Other Ambulatory Visit: Payer: Self-pay | Admitting: Internal Medicine

## 2018-03-27 DIAGNOSIS — Z6841 Body Mass Index (BMI) 40.0 and over, adult: Principal | ICD-10-CM

## 2018-03-28 ENCOUNTER — Encounter: Payer: Self-pay | Admitting: Pharmacist

## 2018-03-28 ENCOUNTER — Other Ambulatory Visit: Payer: Self-pay | Admitting: Pharmacist

## 2018-03-28 DIAGNOSIS — Z7901 Long term (current) use of anticoagulants: Secondary | ICD-10-CM

## 2018-03-28 DIAGNOSIS — I82409 Acute embolism and thrombosis of unspecified deep veins of unspecified lower extremity: Secondary | ICD-10-CM

## 2018-03-30 ENCOUNTER — Other Ambulatory Visit: Payer: Self-pay | Admitting: *Deleted

## 2018-03-30 DIAGNOSIS — Z7901 Long term (current) use of anticoagulants: Secondary | ICD-10-CM

## 2018-03-30 DIAGNOSIS — I82409 Acute embolism and thrombosis of unspecified deep veins of unspecified lower extremity: Secondary | ICD-10-CM

## 2018-04-09 ENCOUNTER — Telehealth: Payer: Self-pay | Admitting: Internal Medicine

## 2018-04-09 NOTE — Telephone Encounter (Signed)
Pt is requesting from a nurse to callback [phone]

## 2018-04-09 NOTE — Telephone Encounter (Signed)
Patient was seen on 02/20/2018 for lower abd pain and UTI. Treated with macrobid and had great improvement until 2-3 days ago. Now with low pelvic pain and right and left lower abdominal pain with increased urinary frequency, malodorous urine and painful urination. Denies fever but did have chills last evening. Denies hematuria, vaginal itching or discharge. Per OV note on 2/11, patient was to return in 6 weeks for f/u UA. Will route to PCP to advise as provider who saw patient on 2/11 is unavailable at this time. Kinnie Feil, RN, BSN

## 2018-04-09 NOTE — Telephone Encounter (Signed)
Please have patient schedule a follow up tele visit with [facility]. Thank you

## 2018-04-09 NOTE — Telephone Encounter (Signed)
Patient aware that she will receive telehealth call tomorrow at 0915. Kinnie Feil, RN, BSN

## 2018-04-10 ENCOUNTER — Telehealth: Payer: Self-pay | Admitting: Pharmacist

## 2018-04-10 ENCOUNTER — Telehealth: Payer: Medicare Other | Admitting: Internal Medicine

## 2018-04-10 ENCOUNTER — Other Ambulatory Visit: Payer: Self-pay

## 2018-04-10 DIAGNOSIS — R1030 Lower abdominal pain, unspecified: Secondary | ICD-10-CM

## 2018-04-10 DIAGNOSIS — N3000 Acute cystitis without hematuria: Secondary | ICD-10-CM

## 2018-04-10 MED ORDER — NITROFURANTOIN MONOHYD MACRO 100 MG PO CAPS: 100.0000 mg | ORAL_CAPSULE | Freq: Two times a day (BID) | ORAL | 0 refills | Status: AC

## 2018-04-10 NOTE — Assessment & Plan Note (Signed)
States she has been having lower abdominal pain on both sides for the past four days. Pain is currently 4/5 but was much worse two days ago. Tylenol helps. She recently had a UTI 2/11 and was treated with nitrofurantoin. She states symptoms are similar to previous UTI, and antiobiotic last time had resolved her symptoms. She denies dysuria or hematuria but endorses urinary frequency and occasional urgency. She denies changes in BM, diarrhea, nausea, changes in appetite and is drinking lots of fluids. She denies fever, chills, back pain, LE swelling, excessive thirst, but does endorse some bloating. She does have a history of diverticulitis with ED visit 09/2017 and seen on CT in addition to inflamed LN and thus diverticulitis is also on the differential. It was recommended she have a f/u colonoscopy but she has only had FIT testing in the past, which has been negative.  Last menstrual period was over 30 years ago, denies any recent bleeding, denies discharge, history of STI and has not been sexually active in 50 years.   - macrobid 100 mg bid for five days  - f/u four days if symptoms do not improve - consider expanding treatment to cipro/flagyl for possible diverticulitis  - recommended to patient that she needs f/u colonoscopy, which would better evaluated the LN seen on CT last September  - discussed proper bathroom hygiene

## 2018-04-10 NOTE — Telephone Encounter (Signed)
Patient called indicating she has been placed upon MacroBid 100mg  PO BID x 5 days. Typically does NOT interfere with warfarin (DDI) but given that we cannot perform a FS POC INR as readily due to the COVID-19 Pandemic, will OMIT 2 doses of warfarin. Resume warfarin on Thursday. IF (as per Dr Bill Salinas note) you are commenced upon "Flagyl" or "Cipro", you must call me immediately to let me know as we will have to adjust the warfarin dose more/document INR response by getting an OP INR at [facility].

## 2018-04-10 NOTE — Progress Notes (Signed)
   CC: lower abdominal pain  HPI:  [name] is a 73 y.o. with PMH as below. This is a telephone encounter between Caroline [name] and [employer] on 04/10/2018 for lower abdominal pain. The visit was conducted with the patient located at home and Versie Starks at [facility]. The patient's identity was confirmed using their DOB and current address. The patient has consented to being evaluated through a telephone encounter and understands the associated risks (an examination cannot be done and the patient may need to come in for an appointment) / benefits (allows the patient to remain at home, decreasing exposure to coronavirus). I personally spent 17 minutes on medical discussion.   She has been having lower abdominal pain on both sides for the past four days. Pain is currently 4/5 but was much worse two days ago. Tylenol helps. She recently had a UTI 2/11 and was treated with nitrofurantoin. She states symptoms are similar to previous UTI, and antiobiotic last time had resolved her symptoms. She denies dysuria or hematuria but endorses urinary frequency and occasional urgency. She denies changes in BM, diarrhea, nausea, changes in appetite and is drinking lots of fluids. She denies fever, chills, back pain, LE swelling, excessive thirst, but does endorse some bloating. She does have a history of diverticulitis with ED visit 09/2017 and seen on CT in addition to inflamed LN and thus diverticulitis is also on the differential. It was recommended she have a f/u colonoscopy but she has only had FIT testing in the past, which has been negative. Last menstrual period was over 30 years ago, denies any recent bleeding, denies discharge, history of STI and has not been sexually active in 50 years.   Please see A&P for assessment of the patient's acute and chronic medical conditions.   As this second infection occurred more than two weeks after finishing her last antibiotic treatment this is considered  reinfection.   Past Medical History:  Diagnosis Date  . Arthritis   . Asthma   . Bronchitis   . CHF (congestive heart failure) (HCC)   . Diabetes (HCC) 2019  . DVT, lower extremity, recurrent (HCC)    Patient had unprovoked PE on 2002 and DVT in right lower extremety 2008.  [name] GERD (gastroesophageal reflux disease)   . History of kidney stones   . Hypertension   . PE (pulmonary embolism)    Patient had unprovoked PE on 2002  . PONV (postoperative nausea and vomiting)   . Vertigo    Review of Systems:   ROS negative except as noted in HPI.    Physical Exam: Unable to obtain HPI 2/2 telehealth visit   There were no vitals filed for this visit.    Assessment & Plan:   See Encounters Tab for problem based charting.  Patient discussed with Dr. Rogelia Boga

## 2018-04-11 NOTE — Progress Notes (Signed)
Internal Medicine Clinic Attending  Case discussed with Dr. Seawell at the time of the visit.  We reviewed the resident's history and exam and pertinent patient test results.  I agree with the assessment, diagnosis, and plan of care documented in the resident's note.    

## 2018-04-27 ENCOUNTER — Other Ambulatory Visit: Payer: Self-pay | Admitting: Internal Medicine

## 2018-04-27 DIAGNOSIS — I824Y9 Acute embolism and thrombosis of unspecified deep veins of unspecified proximal lower extremity: Secondary | ICD-10-CM

## 2018-04-27 NOTE — Telephone Encounter (Signed)
MOST RECENT DOSE RECORDED 03/26/18 BY PHARMACIST DR Juleen Starr

## 2018-04-30 ENCOUNTER — Telehealth: Payer: Self-pay | Admitting: Internal Medicine

## 2018-04-30 DIAGNOSIS — I82409 Acute embolism and thrombosis of unspecified deep veins of unspecified lower extremity: Secondary | ICD-10-CM | POA: Diagnosis not present

## 2018-04-30 DIAGNOSIS — Z7901 Long term (current) use of anticoagulants: Secondary | ICD-10-CM | POA: Diagnosis not present

## 2018-04-30 NOTE — Telephone Encounter (Signed)
Patient was called today because Dr. Charlean Sanfilippo schedule was still in Epic for tomorrow 05/01/2018.  Wanted to be sure the patient did not come in for that appt, because he will not be available.  Confirmed that she understood, but was wanting to have her sugar checked.  Let patient know that I would send her message to the nurse and have some call back.

## 2018-04-30 NOTE — Telephone Encounter (Signed)
Return pt's call - started she had her INR checked at [facility]. She asked if she needs her BS check now or when she has her INR check again? Told her I will let her doctor to decide.

## 2018-05-01 ENCOUNTER — Telehealth: Payer: Self-pay | Admitting: Pharmacist

## 2018-05-01 ENCOUNTER — Other Ambulatory Visit: Payer: Self-pay | Admitting: *Deleted

## 2018-05-01 ENCOUNTER — Ambulatory Visit: Payer: Medicare Other | Admitting: Internal Medicine

## 2018-05-01 DIAGNOSIS — Z7901 Long term (current) use of anticoagulants: Secondary | ICD-10-CM

## 2018-05-01 DIAGNOSIS — I82409 Acute embolism and thrombosis of unspecified deep veins of unspecified lower extremity: Secondary | ICD-10-CM

## 2018-05-01 LAB — PROTIME-INR
INR: 1.7 — ABNORMAL HIGH (ref 0.8–1.2)
Prothrombin Time: 17.7 s — ABNORMAL HIGH (ref 9.1–12.0)

## 2018-05-01 NOTE — Telephone Encounter (Signed)
Reviewed Thanks DrG 

## 2018-05-01 NOTE — Telephone Encounter (Signed)
Just looked at her labs again. She had her blood sugar and A1C checked in March and does not need these labs repeated till June. Her A1C was 6.1 and her blood sugar was 118

## 2018-05-01 NOTE — Telephone Encounter (Signed)
Patient's last A1C was 6.6 in December. We can check it at her next INR appointment or at her next visit with me whichever comes first

## 2018-05-01 NOTE — Telephone Encounter (Signed)
Pt called / informed BS /A1C do not have to be check until June. And last A1C was 6.1 and BS was 118 per Dr Heide Spark. Pt stated "ok".

## 2018-05-01 NOTE — Telephone Encounter (Signed)
Called pt - stated her next INR at [facility] will be May 18th.  I talked to [facility] in our lab; stated Dr Heide Spark needs to put a "future " lab order in Epic to check her BS then notify Caroline [name] and she will release the order. Also let the pt know she needs to tell [facility] about the addition blood work to check her BS.

## 2018-05-01 NOTE — Telephone Encounter (Signed)
INR from [facility] 1.7 (goal 2.0 - 3.0). Denies missed doses or any new medications. Diet has not changed she states. Will increase from 20mg  warfarin/wk to 24mg  warfarin per week as:  1x4mg  on M/Tu/We/Fr/Sa; 1/2 x 4mg  (2mg  dose) on Sun/Thursdays. Repeat sequence each week. Repeat INR at [facility] on 28-May-2018. Denies any signs/symptoms of VTE or bleeding.

## 2018-05-01 NOTE — Telephone Encounter (Signed)
Called patient and shared INR result collected 01-May-2018 at [facility] = 1.7 (goal 2.0 - 3.0). Has been on 20mg  warfarin/wk. INCREASED to 24mg /wk. Repeat INR 28-May-2018. Denies any symptoms of LE VTE or PE. No missed doses, no new medications, no dietary changes. Adequate supply of warfarin on-hand. Next INR at [facility] 28-May-2018.

## 2018-05-22 ENCOUNTER — Other Ambulatory Visit: Payer: Self-pay | Admitting: *Deleted

## 2018-05-23 NOTE — Telephone Encounter (Signed)
I don't know the indication for this medication. It should be prescribed by her eye doctor

## 2018-05-28 ENCOUNTER — Other Ambulatory Visit: Payer: Self-pay | Admitting: Internal Medicine

## 2018-05-28 DIAGNOSIS — Z7901 Long term (current) use of anticoagulants: Secondary | ICD-10-CM | POA: Diagnosis not present

## 2018-05-28 DIAGNOSIS — I82409 Acute embolism and thrombosis of unspecified deep veins of unspecified lower extremity: Secondary | ICD-10-CM | POA: Diagnosis not present

## 2018-05-28 DIAGNOSIS — J4521 Mild intermittent asthma with (acute) exacerbation: Secondary | ICD-10-CM

## 2018-05-29 ENCOUNTER — Telehealth: Payer: Self-pay | Admitting: Pharmacist

## 2018-05-29 ENCOUNTER — Other Ambulatory Visit: Payer: Self-pay | Admitting: *Deleted

## 2018-05-29 DIAGNOSIS — I82409 Acute embolism and thrombosis of unspecified deep veins of unspecified lower extremity: Secondary | ICD-10-CM

## 2018-05-29 DIAGNOSIS — Z7901 Long term (current) use of anticoagulants: Secondary | ICD-10-CM

## 2018-05-29 LAB — PROTIME-INR
INR: 2.4 — ABNORMAL HIGH (ref 0.8–1.2)
Prothrombin Time: 24.8 s — ABNORMAL HIGH (ref 9.1–12.0)

## 2018-05-29 NOTE — Telephone Encounter (Signed)
Called patient with INR 2.4 (goal 2.0 - 3.0) collected 28-May-2018 at [facility]. This value was on 4mg  Mo/Tu/We/Fr/Sa and 1/2x4mg  (2mg  dose) on Su/Th. CONTINUE same regimen. Patient elects to COME TO CLINIC on 25-Jun-2018 at 1045h.

## 2018-05-29 NOTE — Telephone Encounter (Signed)
This was sent to me a couple of days ago. I did not fill this because I do not see a clear indication for this medication and she will need to contact her ophthalmologist for refills if required. Please ask her to contact her eye doctor

## 2018-06-07 NOTE — Telephone Encounter (Signed)
I called pt about another refill request for restasis; stated she has an eye doctor. And she does not need any refills at this time.

## 2018-06-14 ENCOUNTER — Ambulatory Visit (INDEPENDENT_AMBULATORY_CARE_PROVIDER_SITE_OTHER): Payer: Medicare Other | Admitting: Internal Medicine

## 2018-06-14 ENCOUNTER — Encounter: Payer: Self-pay | Admitting: Internal Medicine

## 2018-06-14 ENCOUNTER — Other Ambulatory Visit: Payer: Self-pay

## 2018-06-14 ENCOUNTER — Telehealth: Payer: Self-pay | Admitting: Internal Medicine

## 2018-06-14 ENCOUNTER — Other Ambulatory Visit: Payer: Self-pay | Admitting: Internal Medicine

## 2018-06-14 DIAGNOSIS — R1030 Lower abdominal pain, unspecified: Secondary | ICD-10-CM

## 2018-06-14 DIAGNOSIS — R1032 Left lower quadrant pain: Secondary | ICD-10-CM

## 2018-06-14 DIAGNOSIS — R3916 Straining to void: Secondary | ICD-10-CM | POA: Diagnosis not present

## 2018-06-14 DIAGNOSIS — R3915 Urgency of urination: Secondary | ICD-10-CM

## 2018-06-14 DIAGNOSIS — R42 Dizziness and giddiness: Secondary | ICD-10-CM

## 2018-06-14 MED ORDER — METRONIDAZOLE 500 MG PO TABS: 500.0000 mg | ORAL_TABLET | Freq: Three times a day (TID) | ORAL | 0 refills | Status: AC

## 2018-06-14 MED ORDER — CIPROFLOXACIN HCL 500 MG PO TABS: 500.0000 mg | ORAL_TABLET | Freq: Two times a day (BID) | ORAL | 0 refills | Status: AC

## 2018-06-14 NOTE — Telephone Encounter (Signed)
Pt having lower stomach cramps,painful urination with stinging feeling and thinks she has another UTI and is requesting something to be called in if possible.  Please call patient.

## 2018-06-14 NOTE — Telephone Encounter (Signed)
Please set up a tele visit for her. Thank you

## 2018-06-14 NOTE — Assessment & Plan Note (Signed)
HPI: [name] is a 73 y.o. female with the medical conditions listed below who called the internal medicine clinic for lower abdominal pain. She states the cramping pain is in her lower left and central abdomen. It does not radiate. The pain started two days ago and has progressively worsened. The pain was preceded by two days of nausea (without vomiting) and diarrhea (brown, watery) that resolved at the onset of pain. Her last bowel movement was yesterday and was normal. She denies fevers, chills, dysuria, hematochezia, or melena. She endorses urinary urgency (which is baseline for her) and new urinary straining (with the sensation of complete emptying of her bladder after effort). Tylenol temporarily eases the abdominal pain. She states this episode feels similar to diverticulitis, which she had in 09/2017.  [name] presented to the ED in 09/2017 with similar abdominal pain. CT abdomen showed acute diverticulitis and she was treated with bactrim and flagyl as an outpatient. It has been recommended that she get a colonoscopy to rule out underlying malignancy based on CT findings, but the patient has never had a colonoscopy and is currently not interested in pursuing that.   [name] was also treated for UTI with similar symptoms of lower abdominal pain in 02/2018 and 03/2018. Macrobid resolved her symptoms.   Assessment: The patient's clinical picture is most consistent with mild uncomplicated diverticulitis. Will treat as outpatient with 7 day course of cipro and flagyl. The patient would like to speak with Dr. Alexandria [name] who manages her coumadin dosing prior to starting these medications. I sent a message to Dr. Alexandria [name] to make him aware. I also advised the patient to reconsider colonoscopy and to call the clinic if her symptoms do not improve.  Plan - Cipro and flagyl for 7 days - Colonoscopy when patient amenable

## 2018-06-14 NOTE — Progress Notes (Addendum)
   This is a telephone encounter between Caroline [name] and Caroline [name]  on 06/14/2018 for lower abdominal pain. The visit was conducted with the patient located at home and Caroline [name] at Home. The patient's identity was confirmed using their DOB and current address. The patient has consented to being evaluated through a telephone encounter and understands the associated risks/benefits. I personally spent 15 minutes on medical discussion.   HPI:   [name] is a 73 y.o. female with the medical conditions listed below who called the internal medicine clinic for lower abdominal pain. She states the cramping pain is in her lower left and central abdomen. It does not radiate. The pain started two days ago and has progressively worsened. The pain was preceded by two days of nausea (without vomiting) and diarrhea (brown, watery) that resolved at the onset of pain. Her last bowel movement was yesterday and was normal. She denies fevers, chills, dysuria, hematuria, hematochezia, or melena. She endorses urinary urgency (which is baseline for her) and new urinary straining (with the sensation of complete emptying of her bladder after effort). Tylenol temporarily eases the abdominal pain. She states this episode feels similar to diverticulitis, which she had in 09/2017. Please see problem based charting for assessment and plan.    Past Medical History:  Diagnosis Date  . Arthritis   . Asthma   . Bronchitis   . CHF (congestive heart failure) (HCC)   . Diabetes (HCC) 2019  . DVT, lower extremity, recurrent (HCC)    Patient had unprovoked PE on 2002 and DVT in right lower extremety 2008.  [name] GERD (gastroesophageal reflux disease)   . History of kidney stones   . Hypertension   . PE (pulmonary embolism)    Patient had unprovoked PE on 2002  . PONV (postoperative nausea and vomiting)   . Vertigo     Review of Systems:   Pertinent positives mentioned in HPI. Remainder of all ROS negative.    Assessment & Plan:   Patient discussed with Dr. Oswaldo Done

## 2018-06-15 ENCOUNTER — Telehealth: Payer: Self-pay | Admitting: Pharmacist

## 2018-06-15 NOTE — Progress Notes (Signed)
Internal Medicine Clinic Attending  Case discussed with Dr. Dorrell at the time of the visit.  We reviewed the resident's history and exam and pertinent patient test results.  I agree with the assessment, diagnosis, and plan of care documented in the resident's note.    

## 2018-06-15 NOTE — Telephone Encounter (Signed)
Thank you Dr. Groce.. I agree 

## 2018-06-15 NOTE — Telephone Encounter (Signed)
Called patient who indicates she HAS NOT yet gotten her metronidazole and cipro filled at her Rx. I gave her instructions: OMIT today's dose of warfarin. Saturday, only 1/2 x 4mg  [2mg ] dose; SAME on Sunday. MONDAY, MUST come to [facility] for INR. Instructed her to go to Rx and GET HER PRESCRIPTIONS filled/dispensed for metronidazole and cipro.

## 2018-06-18 ENCOUNTER — Other Ambulatory Visit: Payer: Self-pay

## 2018-06-18 ENCOUNTER — Ambulatory Visit (INDEPENDENT_AMBULATORY_CARE_PROVIDER_SITE_OTHER): Payer: Medicare Other | Admitting: Pharmacist

## 2018-06-18 DIAGNOSIS — Z86718 Personal history of other venous thrombosis and embolism: Secondary | ICD-10-CM

## 2018-06-18 DIAGNOSIS — I82409 Acute embolism and thrombosis of unspecified deep veins of unspecified lower extremity: Secondary | ICD-10-CM | POA: Diagnosis not present

## 2018-06-18 DIAGNOSIS — Z7901 Long term (current) use of anticoagulants: Secondary | ICD-10-CM | POA: Diagnosis not present

## 2018-06-18 DIAGNOSIS — Z5181 Encounter for therapeutic drug level monitoring: Secondary | ICD-10-CM

## 2018-06-18 LAB — POCT INR: INR: 2.5 (ref 2.0–3.0)

## 2018-06-18 NOTE — Patient Instructions (Signed)
Patient instructed to take medications as defined in the Anti-coagulation Track section of this encounter.  Patient instructed to take today's dose.  Patient instructed to take ONLY 1/2 tablet of your 4mg  blue colored warfarin tablets by mouth, once-daily at 6PM--EXCEPT on this Tuesday, June 9th, 2020--DO NOT TAKE ANY warfarin on this one day. Resume taking again on Wednesday, June 10th and continue taking 1/2 tablet daily.  Patient verbalized understanding of these instructions.

## 2018-06-18 NOTE — Progress Notes (Signed)
INTERNAL MEDICINE TEACHING ATTENDING ADDENDUM - Nicanor Mendolia M.D  °Duration- indefinite, Indication- DVT, PE, INR- therapeutic. Agree with pharmacy recommendations as outlined in their note.  ° ° ° °

## 2018-06-18 NOTE — Progress Notes (Signed)
Anticoagulation Management Caroline MccallumDaisy N [name] is a 73 y.o. female who reports to the clinic for monitoring of warfarin treatment.    Indication: DVT, lower extremity, history of recurrent; History of Pulmonary Embolism; Long term current use of anticoagulant.   Duration: indefinite Supervising physician: Debe CoderEmily Mullen  Anticoagulation Clinic Visit History: Patient does not report signs/symptoms of bleeding or thromboembolism  Other recent changes: No diet, medications, lifestyle changes except as noted.  Anticoagulation Episode Summary    Current INR goal:   2.0-3.0  TTR:   79.3 % (3.4 y)  Next INR check:   06/25/2018  INR from last check:   2.5 (06/18/2018)  Weekly max warfarin dose:     Target end date:     INR check location:   Anticoagulation Clinic  Preferred lab:     Send INR reminders to:   [facility]   Indications   DVT lower extremity recurrent (HCC) [I82.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 Penicillins Anaphylaxis, Hives, Swelling and Rash    Has patient had a PCN reaction causing immediate rash, facial/tongue/throat swelling, SOB or lightheadedness with hypotension: Yes Has patient had a PCN reaction causing severe rash involving mucus membranes or skin necrosis: Yes Has patient had a PCN reaction that required hospitalization Yes Has patient had a PCN reaction occurring within the last 10 years: No If all of the above answers are "NO", then may proceed with Cephalosporin use.   . Cabbage Itching  . Keflex [Cephalexin] Hives    And feeling of throat tightness  . Shellfish Allergy Swelling  . Tomato Swelling  . Latex Itching and Rash   Prior to Admission medications   Medication Sig Start Date End Date Taking? Authorizing Provider  budesonide-formoterol (SYMBICORT) 160-4.5 MCG/ACT inhaler Inhale 2 puffs into the lungs 2 (two) times daily. 08/29/17  Yes Molt, Bethany, DO  calcium  citrate-vitamin D (CITRACAL+D) 315-200 MG-UNIT tablet TAKE 2 TABLETS DAILY BY MOUTH. 03/27/18  Yes Earl LagosNarendra, Nischal, MD  ciprofloxacin (CIPRO) 500 MG tablet Take 1 tablet (500 mg total) by mouth 2 (two) times daily for 7 days. 06/14/18 06/21/18 Yes Dorrell, Cathleen Cortieborah N, MD  fluticasone (FLONASE) 50 MCG/ACT nasal spray PLACE 2 SPRAYS DAILY INTO BOTH NOSTRILS. 02/13/18  Yes Earl LagosNarendra, Nischal, MD  furosemide (LASIX) 40 MG tablet Take 1 tablet (40 mg total) by mouth every other day. 12/15/17  Yes Earl LagosNarendra, Nischal, MD  hydrocortisone (PROCTOSOL HC) 2.5 % rectal cream Place 1 application rectally 2 (two) times daily. 10/26/17  Yes Earl LagosNarendra, Nischal, MD  losartan (COZAAR) 100 MG tablet Take 1 tablet (100 mg total) by mouth daily. 03/05/18  Yes Camnitz, Andree CossWill Martin, MD  magnesium oxide (MAG-OX) 400 MG tablet Take 1 tablet (400 mg total) by mouth 2 (two) times daily. 03/05/18  Yes Camnitz, Will Daphine DeutscherMartin, MD  meclizine (ANTIVERT) 25 MG tablet TAKE 1 TABLET BY MOUTH THREE TIMES A DAY AS NEEDED 06/14/18  Yes Earl LagosNarendra, Nischal, MD  metFORMIN (GLUCOPHAGE-XR) 500 MG 24 hr tablet TAKE 1 TABLET BY MOUTH EVERY DAY WITH BREAKFAST 01/30/18  Yes Earl LagosNarendra, Nischal, MD  metroNIDAZOLE (FLAGYL) 500 MG tablet Take 1 tablet (500 mg total) by mouth 3 (three) times daily for 7 days. 06/14/18 06/21/18 Yes Dorrell, Cathleen Cortieborah N, MD  montelukast (SINGULAIR) 10 MG tablet Take 1 tablet (10 mg total) by mouth at bedtime. 08/29/17  Yes Molt, Bethany, DO  pantoprazole (  PROTONIX) 40 MG tablet Take 1 tablet (40 mg total) by mouth daily. 01/30/18  Yes Earl Lagos, MD  PROAIR HFA 108 (90 Base) MCG/ACT inhaler TAKE 2 PUFFS BY MOUTH EVERY 6 HOURS AS NEEDED FOR WHEEZE OR SHORTNESS OF BREATH 01/30/18  Yes Narendra, Nischal, MD  RESTASIS 0.05 % ophthalmic emulsion PLACE 1 DROP INTO BOTH EYES 2 (TWO) TIMES DAILY. USE ONCE DAILY Patient taking differently: Place 1 drop into both eyes 2 (two) times daily.  08/29/17  Yes Molt, Bethany, DO  rosuvastatin (CRESTOR) 20 MG  tablet Take 1 tablet (20 mg total) by mouth daily. 01/30/18  Yes Earl Lagos, MD  warfarin (COUMADIN) 4 MG tablet TAKE 1 TAB BY MOUTH DAILY AT 6PM MONDAYS WEDNESDAYS ANS FRIDAYS. ON ALL OTHER DAYS, TAKE ONLY 1/2 TABLET. 04/27/18  Yes Levert Feinstein, MD  carvedilol (COREG) 12.5 MG tablet Take 1 tablet (12.5 mg total) by mouth 2 (two) times daily. 03/05/18 06/03/18  Camnitz, Andree Coss, MD  EPINEPHrine 0.3 mg/0.3 mL IJ SOAJ injection Inject 0.3 mg into the muscle as needed. Severe allergic reactions 10/30/17   [provider]  phenol (CHLORASEPTIC) 1.4 % LIQD Use as directed 2 sprays in the mouth or throat as needed for throat irritation / pain. Patient not taking: Reported on 06/18/2018 10/30/17   Versie Starks, DO   Past Medical History:  Diagnosis Date  . Arthritis   . Asthma   . Bronchitis   . CHF (congestive heart failure) (HCC)   . Diabetes (HCC) 2019  . DVT, lower extremity, recurrent (HCC)    Patient had unprovoked PE on 2002 and DVT in right lower extremety 2008.  [name] GERD (gastroesophageal reflux disease)   . History of kidney stones   . Hypertension   . PE (pulmonary embolism)    Patient had unprovoked PE on 2002  . PONV (postoperative nausea and vomiting)   . Vertigo    Social History   Socioeconomic History  . Marital status: Divorced    Spouse name: Not on file  . Number of children: 5  . Years of education: 9 th grade  . Highest education level: 9th grade  Occupational History  . Occupation: Merchandiser, retail  . Financial resource strain: Somewhat hard  . Food insecurity:    Worry: Never true    Inability: Sometimes true  . Transportation needs:    Medical: No    Non-medical: Yes  Tobacco Use  . Smoking status: Former Smoker    Types: Cigarettes    Last attempt to quit: 09/18/1988    Years since quitting: 29.7  . Smokeless tobacco: Never Used  . Tobacco comment: 6 pack year smoking history as a teen  Substance and Sexual Activity  .  Alcohol use: No  . Drug use: No  . Sexual activity: Never    Birth control/protection: None  Lifestyle  . Physical activity:    Days per week: 2 days    Minutes per session: 20 min  . Stress: To some extent  Relationships  . Social connections:    Talks on phone: More than three times a week    Gets together: Once a week    Attends religious service: More than 4 times per year    Active member of club or organization: No    Attends meetings of clubs or organizations: Never    Relationship status: Divorced  Other Topics Concern  . Not on file  Social History Narrative   Current  Social History 05/17/2017        Patient lives with 26 yo granddaughter" in a/an home / condo / townhome which is 1 story/stories. There are not steps up to the entrance the patient uses.       Patient's method of transportation is church member.      The highest level of education was some high school.      The patient currently retired.      Identified important Relationships are God, family       Pets : 1 lab/pitt mix named Cinnamon       Interests / Fun: Church,TV       Current Stressors: "Me and my granddaughter"       Religious / Personal Beliefs: I'm Holiness and I love people"       Other: "I'd give away my last dime."    Family History  Problem Relation Age of Onset  . Cancer Mother   . Hypertension Sister   . Diabetes Sister   . Hypertension Brother   . Diabetes Brother   . Hypertension Sister   . Diabetes Sister   . Colon cancer Other   . Heart Problems Other 34       sister's child, open heart surgery  . CVA Father   . Diabetes Son   . Hypertension Son   . Kidney disease Son        on dialysis  . Hypertension Sister   . Diabetes Sister   . Hypertension Sister   . Diabetes Sister   . Cancer Brother   . Hypertension Son   . Diabetes Son   . Hypertension Daughter   . Schizophrenia Daughter     ASSESSMENT Recent Results: The most recent result is correlated with an  omitted dose on  Friday 15-Jun-2018 and only 2mg  on Saturday and Sunday. Lab Results  Component Value Date   INR 2.5 06/18/2018   INR 2.4 (H) 05/28/2018   INR 1.7 (H) 04/30/2018    Anticoagulation Dosing: Description   Take ONLY 1/2 tablet of your 4mg  blue colored warfarin tablets by mouth, once-daily at 6PM--EXCEPT on this Tuesday, June 9th, 2020--DO NOT TAKE ANY warfarin on this one day. Resume taking again on Wednesday, June 10th and continue taking 1/2 tablet daily.      INR today: Therapeutic  PLAN Weekly dose was decreased to only 12mg  between now and next INR.   Patient Instructions  Patient instructed to take medications as defined in the Anti-coagulation Track section of this encounter.  Patient instructed to take today's dose.  Patient instructed to take ONLY 1/2 tablet of your 4mg  blue colored warfarin tablets by mouth, once-daily at 6PM--EXCEPT on this Tuesday, June 9th, 2020--DO NOT TAKE ANY warfarin on this one day. Resume taking again on Wednesday, June 10th and continue taking 1/2 tablet daily.  Patient verbalized understanding of these instructions.     Patient advised to contact clinic or seek medical attention if signs/symptoms of bleeding or thromboembolism occur.  Patient verbalized understanding by repeating back information and was advised to contact me if further medication-related questions arise. Patient was also provided an information handout.  Follow-up Return in 1 week (on 06/25/2018) for Follow up INR at 10:30AM.  Pennie Banter. PharmD, CPP  15 minutes spent face-to-face with the patient during the encounter. 50% of time spent on education, including signs/sx bleeding and clotting, as well as food and drug interactions with warfarin. 50% of time was spent on  fingerprick POC INR sample collection,processing, results determination, and documentation in [email or web address]

## 2018-06-19 NOTE — Progress Notes (Signed)
I reviewed Dr. Gladstone Pih note.  Patient is on coumadin for recurrent VTE.  Her dose was modified based on INR.

## 2018-06-25 ENCOUNTER — Ambulatory Visit (INDEPENDENT_AMBULATORY_CARE_PROVIDER_SITE_OTHER): Payer: Medicare Other | Admitting: Pharmacist

## 2018-06-25 ENCOUNTER — Other Ambulatory Visit: Payer: Self-pay

## 2018-06-25 DIAGNOSIS — Z7901 Long term (current) use of anticoagulants: Secondary | ICD-10-CM

## 2018-06-25 DIAGNOSIS — I82409 Acute embolism and thrombosis of unspecified deep veins of unspecified lower extremity: Secondary | ICD-10-CM

## 2018-06-25 DIAGNOSIS — Z86718 Personal history of other venous thrombosis and embolism: Secondary | ICD-10-CM

## 2018-06-25 DIAGNOSIS — Z5181 Encounter for therapeutic drug level monitoring: Secondary | ICD-10-CM

## 2018-06-25 LAB — POCT INR: INR: 2.1 (ref 2.0–3.0)

## 2018-06-25 NOTE — Progress Notes (Signed)
Anticoagulation Management Caroline [name] is a 73 y.o. female who reports to the clinic for monitoring of warfarin treatment.    Indication: DVT, History of; PE, History of; Long tern current use of anticoagulant.   Duration: indefinite Supervising physician: Joni Reining  Anticoagulation Clinic Visit History: Patient does not report signs/symptoms of bleeding or thromboembolism  Other recent changes: No diet, medications, lifestyle except as noted: Has recently completed a treatment course of metrondiazole and ciprofloxicin for diverticulitis. During that time, we empirically held warfarin for one day and decreased her daily dose by 50% for a total of 12mg /wk since last INR.  Anticoagulation Episode Summary    Current INR goal:  2.0-3.0  TTR:  79.4 % (3.4 y)  Next INR check:  06/25/2018  INR from last check:  2.1 (06/25/2018)  Weekly max warfarin dose:    Target end date:    INR check location:  Anticoagulation Clinic  Preferred lab:    Send INR reminders to:  [facility]   Indications   DVT lower extremity recurrent ([place]) [I82.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 Penicillins Anaphylaxis, Hives, Swelling and Rash    Has patient had a PCN reaction causing immediate rash, facial/tongue/throat swelling, SOB or lightheadedness with hypotension: Yes Has patient had a PCN reaction causing severe rash involving mucus membranes or skin necrosis: Yes Has patient had a PCN reaction that required hospitalization Yes Has patient had a PCN reaction occurring within the last 10 years: No If all of the above answers are "NO", then may proceed with Cephalosporin use.   . Cabbage Itching  . Keflex [Cephalexin] Hives    And feeling of throat tightness  . Shellfish Allergy Swelling  . Tomato Swelling  . Latex Itching and Rash    Current Outpatient Medications:  .  budesonide-formoterol (SYMBICORT) 160-4.5 MCG/ACT  inhaler, Inhale 2 puffs into the lungs 2 (two) times daily., Disp: 3 Inhaler, Rfl: 4 .  calcium citrate-vitamin D (CITRACAL+D) 315-200 MG-UNIT tablet, TAKE 2 TABLETS DAILY BY MOUTH., Disp: 160 tablet, Rfl: 2 .  EPINEPHrine 0.3 mg/0.3 mL IJ SOAJ injection, Inject 0.3 mg into the muscle as needed. Severe allergic reactions, Disp: , Rfl: 0 .  fluticasone (FLONASE) 50 MCG/ACT nasal spray, PLACE 2 SPRAYS DAILY INTO BOTH NOSTRILS., Disp: 16 g, Rfl: 1 .  furosemide (LASIX) 40 MG tablet, Take 1 tablet (40 mg total) by mouth every other day., Disp: 90 tablet, Rfl: 1 .  hydrocortisone (PROCTOSOL HC) 2.5 % rectal cream, Place 1 application rectally 2 (two) times daily., Disp: 30 g, Rfl: 0 .  losartan (COZAAR) 100 MG tablet, Take 1 tablet (100 mg total) by mouth daily., Disp: 90 tablet, Rfl: 2 .  magnesium oxide (MAG-OX) 400 MG tablet, Take 1 tablet (400 mg total) by mouth 2 (two) times daily., Disp: 90 tablet, Rfl: 3 .  meclizine (ANTIVERT) 25 MG tablet, TAKE 1 TABLET BY MOUTH THREE TIMES A DAY AS NEEDED, Disp: 90 tablet, Rfl: 1 .  metFORMIN (GLUCOPHAGE-XR) 500 MG 24 hr tablet, TAKE 1 TABLET BY MOUTH EVERY DAY WITH BREAKFAST, Disp: 90 tablet, Rfl: 1 .  montelukast (SINGULAIR) 10 MG tablet, Take 1 tablet (10 mg total) by mouth at bedtime., Disp: 90 tablet, Rfl: 3 .  pantoprazole (PROTONIX) 40 MG tablet, Take 1 tablet (40 mg total) by mouth daily., Disp: 90 tablet, Rfl: 1 .  phenol (  CHLORASEPTIC) 1.4 % LIQD, Use as directed 2 sprays in the mouth or throat as needed for throat irritation / pain., Disp: 29 mL, Rfl: 0 .  PROAIR HFA 108 (90 Base) MCG/ACT inhaler, TAKE 2 PUFFS BY MOUTH EVERY 6 HOURS AS NEEDED FOR WHEEZE OR SHORTNESS OF BREATH, Disp: 8.5 Inhaler, Rfl: 2 .  RESTASIS 0.05 % ophthalmic emulsion, PLACE 1 DROP INTO BOTH EYES 2 (TWO) TIMES DAILY. USE ONCE DAILY (Patient taking differently: Place 1 drop into both eyes 2 (two) times daily. ), Disp: 60 mL, Rfl: 0 .  rosuvastatin (CRESTOR) 20 MG tablet, Take 1  tablet (20 mg total) by mouth daily., Disp: 90 tablet, Rfl: 3 .  warfarin (COUMADIN) 4 MG tablet, TAKE 1 TAB BY MOUTH DAILY AT 6PM MONDAYS WEDNESDAYS ANS FRIDAYS. ON ALL OTHER DAYS, TAKE ONLY 1/2 TABLET., Disp: 56 tablet, Rfl: 3 .  carvedilol (COREG) 12.5 MG tablet, Take 1 tablet (12.5 mg total) by mouth 2 (two) times daily., Disp: 180 tablet, Rfl: 2 Past Medical History:  Diagnosis Date  . Arthritis   . Asthma   . Bronchitis   . CHF (congestive heart failure) (HCC)   . Diabetes (HCC) 2019  . DVT, lower extremity, recurrent (HCC)    Patient had unprovoked PE on 2002 and DVT in right lower extremety 2008.  [name]. GERD (gastroesophageal reflux disease)   . History of kidney stones   . Hypertension   . PE (pulmonary embolism)    Patient had unprovoked PE on 2002  . PONV (postoperative nausea and vomiting)   . Vertigo    Social History   Socioeconomic History  . Marital status: Divorced    Spouse name: Not on file  . Number of children: 5  . Years of education: 9 th grade  . Highest education level: 9th grade  Occupational History  . Occupation: Merchandiser, retailretail  Social Needs  . Financial resource strain: Somewhat hard  . Food insecurity    Worry: Never true    Inability: Sometimes true  . Transportation needs    Medical: No    Non-medical: Yes  Tobacco Use  . Smoking status: Former Smoker    Types: Cigarettes    Quit date: 09/18/1988    Years since quitting: 29.7  . Smokeless tobacco: Never Used  . Tobacco comment: 6 pack year smoking history as a teen  Substance and Sexual Activity  . Alcohol use: No  . Drug use: No  . Sexual activity: Never    Birth control/protection: None  Lifestyle  . Physical activity    Days per week: 2 days    Minutes per session: 20 min  . Stress: To some extent  Relationships  . Social connections    Talks on phone: More than three times a week    Gets together: Once a week    Attends religious service: More than 4 times per year    Active member of  club or organization: No    Attends meetings of clubs or organizations: Never    Relationship status: Divorced  Other Topics Concern  . Not on file  Social History Narrative   Current Social History 05/17/2017        Patient lives with 71 yo granddaughter" in a/an home / condo / townhome which is 1 story/stories. There are not steps up to the entrance the patient uses.       Patient's method of transportation is church member.      The highest level of  education was some high school.      The patient currently retired.      Identified important Relationships are God, family       Pets : 1 lab/pitt mix named Cinnamon       Interests / Fun: Church,TV       Current Stressors: "Me and my granddaughter"       Religious / Personal Beliefs: I'm Holiness and I love people"       Other: "I'd give away my last dime."    Family History  Problem Relation Age of Onset  . Cancer Mother   . Hypertension Sister   . Diabetes Sister   . Hypertension Brother   . Diabetes Brother   . Hypertension Sister   . Diabetes Sister   . Colon cancer Other   . Heart Problems Other 34       sister's child, open heart surgery  . CVA Father   . Diabetes Son   . Hypertension Son   . Kidney disease Son        on dialysis  . Hypertension Sister   . Diabetes Sister   . Hypertension Sister   . Diabetes Sister   . Cancer Brother   . Hypertension Son   . Diabetes Son   . Hypertension Daughter   . Schizophrenia Daughter     ASSESSMENT Recent Results: The most recent result is correlated with 12 mg per week: Lab Results  Component Value Date   INR 2.1 06/25/2018   INR 2.5 06/18/2018   INR 2.4 (H) 05/28/2018    Anticoagulation Dosing: Description   Take one (1) of your 4mg  strength blue warfarin tablets by mouth, once-daily at 6PM, Mondays, Tuesdays, Wednesdays, Thursdays and Fridays. On Saturdays and Sundays, take only one-half (1/2) of your 4mg  strength blue warfarin tablet.      INR  today: Therapeutic  PLAN Weekly dose was increased back to her usual treatment regimen of 24mg  warfarin/wk.  Patient Instructions  Patient instructed to take medications as defined in the Anti-coagulation Track section of this encounter.  Patient instructed to take today's dose. Patient instructed to take one (1) of your 4mg  strength blue warfarin tablets by mouth, once-daily at [facility], Mondays, Tuesdays, Wednesdays, Thursdays and Fridays. On Saturdays and Sundays, take only one-half (1/2) of your 4mg  strength blue warfarin tablet.  Patient verbalized understanding of these instructions.     Patient advised to contact clinic or seek medical attention if signs/symptoms of bleeding or thromboembolism occur.  Patient verbalized understanding by repeating back information and was advised to contact me if further medication-related questions arise. Patient was also provided an information handout.  Follow-up Return in 2 weeks (on 07/09/2018) for Follow up INR with Dr. Alexandria Lodge at 10:30 AM in the [facility].Elicia Lamp, PharmD, CPP  15 minutes spent face-to-face with the patient during the encounter. 50% of time spent on education, including signs/sx bleeding and clotting, as well as food and drug interactions with warfarin. 50% of time was spent on fingerprick POC INR sample collection,processing, results determination, and documentation in [email or web address].

## 2018-06-25 NOTE — Patient Instructions (Signed)
Patient instructed to take medications as defined in the Anti-coagulation Track section of this encounter.  Patient instructed to take today's dose. Patient instructed to take one (1) of your 4mg  strength blue warfarin tablets by mouth, once-daily at [facility], Mondays, Tuesdays, Wednesdays, Thursdays and Fridays. On Saturdays and Sundays, take only one-half (1/2) of your 4mg  strength blue warfarin tablet.  Patient verbalized understanding of these instructions.

## 2018-07-09 ENCOUNTER — Other Ambulatory Visit: Payer: Self-pay

## 2018-07-09 ENCOUNTER — Ambulatory Visit (INDEPENDENT_AMBULATORY_CARE_PROVIDER_SITE_OTHER): Payer: Medicare Other | Admitting: Pharmacist

## 2018-07-09 DIAGNOSIS — Z5181 Encounter for therapeutic drug level monitoring: Secondary | ICD-10-CM

## 2018-07-09 DIAGNOSIS — I82409 Acute embolism and thrombosis of unspecified deep veins of unspecified lower extremity: Secondary | ICD-10-CM | POA: Diagnosis not present

## 2018-07-09 DIAGNOSIS — Z86718 Personal history of other venous thrombosis and embolism: Secondary | ICD-10-CM

## 2018-07-09 DIAGNOSIS — Z7901 Long term (current) use of anticoagulants: Secondary | ICD-10-CM | POA: Diagnosis not present

## 2018-07-09 LAB — POCT INR: INR: 2.5 (ref 2.0–3.0)

## 2018-07-09 NOTE — Patient Instructions (Signed)
Patient instructed to take medications as defined in the Anti-coagulation Track section of this encounter.  Patient instructed to take today's dose. Patient instructed to take one (1) of your 4mg strength blue warfarin tablets by mouth, once-daily at 6PM, Mondays, Tuesdays, Wednesdays, Thursdays and Fridays. On Saturdays and Sundays, take only one-half (1/2) of your 4mg strength blue warfarin tablet.  Patient verbalized understanding of these instructions.    

## 2018-07-09 NOTE — Progress Notes (Signed)
INTERNAL MEDICINE TEACHING ATTENDING ADDENDUM  I agree with pharmacy recommendations as outlined in their note.   Alexander N Raines, MD  

## 2018-07-09 NOTE — Progress Notes (Signed)
Anticoagulation Management Caroline MccallumDaisy N Perno is a 73 y.o. female who reports to the clinic for monitoring of warfarin treatment.    Indication: DVT, History of: PE, History of (Resolved); Long term current use of anticoagulant.   Duration: indefinite Supervising physician: Jessy Otoaines, Alexander, MD, PhD  Anticoagulation Clinic Visit History: Patient does not report signs/symptoms of bleeding or thromboembolism  Other recent changes: No diet, medications, lifestyle changes.  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 Penicillins Anaphylaxis, Hives, Swelling and Rash    Has patient had a PCN reaction causing immediate rash, facial/tongue/throat swelling, SOB or lightheadedness with hypotension: Yes Has patient had a PCN reaction causing severe rash involving mucus membranes or skin necrosis: Yes Has patient had a PCN reaction that required hospitalization Yes Has patient had a PCN reaction occurring within the last 10 years: No If all of the above answers are "NO", then may proceed with Cephalosporin use.   . Cabbage Itching  . Keflex [Cephalexin] Hives    And feeling of throat tightness  . Shellfish Allergy Swelling  . Tomato Swelling  . Latex Itching and Rash    Current Outpatient Medications:  .  budesonide-formoterol (SYMBICORT) 160-4.5 MCG/ACT inhaler, Inhale 2 puffs into the lungs 2 (two) times daily., Disp: 3 Inhaler, Rfl: 4 .  calcium citrate-vitamin D (CITRACAL+D) 315-200 MG-UNIT tablet, TAKE 2 TABLETS DAILY BY MOUTH., Disp: 160 tablet, Rfl: 2 .  EPINEPHrine 0.3  mg/0.3 mL IJ SOAJ injection, Inject 0.3 mg into the muscle as needed. Severe allergic reactions, Disp: , Rfl: 0 .  fluticasone (FLONASE) 50 MCG/ACT nasal spray, PLACE 2 SPRAYS DAILY INTO BOTH NOSTRILS., Disp: 16 g, Rfl: 1 .  furosemide (LASIX) 40 MG tablet, Take 1 tablet (40 mg total) by mouth every other day., Disp: 90 tablet, Rfl: 1 .  hydrocortisone (PROCTOSOL HC) 2.5 % rectal cream, Place 1 application rectally 2 (two) times daily., Disp: 30 g, Rfl: 0 .  losartan (COZAAR) 100 MG tablet, Take 1 tablet (100 mg total) by mouth daily., Disp: 90 tablet, Rfl: 2 .  magnesium oxide (MAG-OX) 400 MG tablet, Take 1 tablet (400 mg total) by mouth 2 (two) times daily., Disp: 90 tablet, Rfl: 3 .  meclizine (ANTIVERT) 25 MG tablet, TAKE 1 TABLET BY MOUTH THREE TIMES A DAY AS NEEDED, Disp: 90 tablet, Rfl: 1 .  metFORMIN (GLUCOPHAGE-XR) 500 MG 24 hr tablet, TAKE 1 TABLET BY MOUTH EVERY DAY WITH BREAKFAST, Disp: 90 tablet, Rfl: 1 .  montelukast (SINGULAIR) 10 MG tablet, Take 1 tablet (10 mg total) by mouth at bedtime., Disp: 90 tablet, Rfl: 3 .  pantoprazole (PROTONIX) 40 MG tablet, Take 1 tablet (40 mg total) by mouth daily., Disp: 90 tablet, Rfl: 1 .  phenol (CHLORASEPTIC) 1.4 % LIQD, Use as directed 2 sprays in the mouth or throat as needed for throat irritation / pain., Disp: 29 mL, Rfl: 0 .  PROAIR HFA 108 (90 Base) MCG/ACT inhaler, TAKE 2  PUFFS BY MOUTH EVERY 6 HOURS AS NEEDED FOR WHEEZE OR SHORTNESS OF BREATH, Disp: 8.5 Inhaler, Rfl: 2 .  RESTASIS 0.05 % ophthalmic emulsion, PLACE 1 DROP INTO BOTH EYES 2 (TWO) TIMES DAILY. USE ONCE DAILY (Patient taking differently: Place 1 drop into both eyes 2 (two) times daily. ), Disp: 60 mL, Rfl: 0 .  rosuvastatin (CRESTOR) 20 MG tablet, Take 1 tablet (20 mg total) by mouth daily., Disp: 90 tablet, Rfl: 3 .  warfarin (COUMADIN) 4 MG tablet, TAKE 1 TAB BY MOUTH DAILY AT 6PM MONDAYS WEDNESDAYS ANS FRIDAYS. ON ALL OTHER DAYS, TAKE ONLY 1/2 TABLET., Disp: 56 tablet, Rfl:  3 .  carvedilol (COREG) 12.5 MG tablet, Take 1 tablet (12.5 mg total) by mouth 2 (two) times daily., Disp: 180 tablet, Rfl: 2 Past Medical History:  Diagnosis Date  . Arthritis   . Asthma   . Bronchitis   . CHF (congestive heart failure) ([place])   . Diabetes ([place]) 2019  . DVT, lower extremity, recurrent ([place])    Patient had unprovoked PE on 2002 and DVT in right lower extremety 2008.  [name] GERD (gastroesophageal reflux disease)   . History of kidney stones   . Hypertension   . PE (pulmonary embolism)    Patient had unprovoked PE on 2002  . PONV (postoperative nausea and vomiting)   . Vertigo    Social History   Socioeconomic History  . Marital status: Divorced    Spouse name: Not on file  . Number of children: 5  . Years of education: 9 th grade  . Highest education level: 9th grade  Occupational History  . Occupation: Lexicographer  . Financial resource strain: Somewhat hard  . Food insecurity    Worry: Never true    Inability: Sometimes true  . Transportation needs    Medical: No    Non-medical: Yes  Tobacco Use  . Smoking status: Former Smoker    Types: Cigarettes    Quit date: 09/18/1988    Years since quitting: 29.8  . Smokeless tobacco: Never Used  . Tobacco comment: 6 pack year smoking history as a teen  Substance and Sexual Activity  . Alcohol use: No  . Drug use: No  . Sexual activity: Never    Birth control/protection: None  Lifestyle  . Physical activity    Days per week: 2 days    Minutes per session: 20 min  . Stress: To some extent  Relationships  . Social connections    Talks on phone: More than three times a week    Gets together: Once a week    Attends religious service: More than 4 times per year    Active member of club or organization: No    Attends meetings of clubs or organizations: Never    Relationship status: Divorced  Other Topics Concern  . Not on file  Social History Narrative   Current Social History 05/17/2017         Patient lives with 60 yo granddaughter" in a/an home / condo / townhome which is 1 story/stories. There are not steps up to the entrance the patient uses.       Patient's method of transportation is church member.      The highest level of education was some high school.      The patient currently retired.      Identified important Relationships are God, family       Pets : 1 lab/pitt mix named  Cinnamon       Interests / Fun: Church,TV       Current Stressors: "Me and my granddaughter"       Religious / Personal Beliefs: I'm Holiness and I love people"       Other: "I'd give away my last dime."    Family History  Problem Relation Age of Onset  . Cancer Mother   . Hypertension Sister   . Diabetes Sister   . Hypertension Brother   . Diabetes Brother   . Hypertension Sister   . Diabetes Sister   . Colon cancer Other   . Heart Problems Other 34       sister's child, open heart surgery  . CVA Father   . Diabetes Son   . Hypertension Son   . Kidney disease Son        on dialysis  . Hypertension Sister   . Diabetes Sister   . Hypertension Sister   . Diabetes Sister   . Cancer Brother   . Hypertension Son   . Diabetes Son   . Hypertension Daughter   . Schizophrenia Daughter     ASSESSMENT Recent Results: The most recent result is correlated with 24 mg per week: Lab Results  Component Value Date   INR 2.5 07/09/2018   INR 2.1 06/25/2018   INR 2.5 06/18/2018    Anticoagulation Dosing: Description   Take one (1) of your 4mg  strength blue warfarin tablets by mouth, once-daily at 6PM, Mondays, Tuesdays, Wednesdays, Thursdays and Fridays. On Saturdays and Sundays, take only one-half (1/2) of your 4mg  strength blue warfarin tablet.      INR today: Therapeutic  PLAN Weekly dose was unchanged.   Patient Instructions  Patient instructed to take medications as defined in the Anti-coagulation Track section of this encounter.  Patient instructed to take today's dose.   Patient instructed to take one (1) of your 4mg  strength blue warfarin tablets by mouth, once-daily at [facility], Mondays, Tuesdays, Wednesdays, Thursdays and Fridays. On Saturdays and Sundays, take only one-half (1/2) of your 4mg  strength blue warfarin tablet.  Patient verbalized understanding of these instructions.     Patient advised to contact clinic or seek medical attention if signs/symptoms of bleeding or thromboembolism occur.  Patient verbalized understanding by repeating back information and was advised to contact me if further medication-related questions arise. Patient was also provided an information handout.  Follow-up Return in 4 weeks (on 08/06/2018) for Follow up INR.  Elicia Lamp, PharmD, CPP  15 minutes spent face-to-face with the patient during the encounter. 50% of time spent on education, including signs/sx bleeding and clotting, as well as food and drug interactions with warfarin. 50% of time was spent on fingerprick POC INR sample collection,processing, results determination, and documentation in [email or web address].

## 2018-07-16 ENCOUNTER — Other Ambulatory Visit: Payer: Self-pay | Admitting: Cardiology

## 2018-07-20 ENCOUNTER — Other Ambulatory Visit: Payer: Self-pay | Admitting: Internal Medicine

## 2018-07-20 DIAGNOSIS — R7309 Other abnormal glucose: Secondary | ICD-10-CM

## 2018-07-20 DIAGNOSIS — E118 Type 2 diabetes mellitus with unspecified complications: Secondary | ICD-10-CM

## 2018-08-06 ENCOUNTER — Ambulatory Visit (INDEPENDENT_AMBULATORY_CARE_PROVIDER_SITE_OTHER): Payer: Medicare Other | Admitting: Pharmacist

## 2018-08-06 ENCOUNTER — Other Ambulatory Visit: Payer: Self-pay

## 2018-08-06 DIAGNOSIS — Z86718 Personal history of other venous thrombosis and embolism: Secondary | ICD-10-CM

## 2018-08-06 DIAGNOSIS — I82409 Acute embolism and thrombosis of unspecified deep veins of unspecified lower extremity: Secondary | ICD-10-CM

## 2018-08-06 DIAGNOSIS — Z7901 Long term (current) use of anticoagulants: Secondary | ICD-10-CM

## 2018-08-06 DIAGNOSIS — Z5181 Encounter for therapeutic drug level monitoring: Secondary | ICD-10-CM | POA: Diagnosis not present

## 2018-08-06 LAB — POCT INR: INR: 2.7 (ref 2.0–3.0)

## 2018-08-06 NOTE — Patient Instructions (Signed)
Patient instructed to take medications as defined in the Anti-coagulation Track section of this encounter.  Patient instructed to take today's dose. Patient instructed to take one (1) of your 4mg strength blue warfarin tablets by mouth, once-daily at 6PM, Mondays, Tuesdays, Wednesdays, Thursdays and Fridays. On Saturdays and Sundays, take only one-half (1/2) of your 4mg strength blue warfarin tablet.  Patient verbalized understanding of these instructions.    

## 2018-08-06 NOTE — Progress Notes (Signed)
Anticoagulation Management Caroline [name] is a 73 y.o. female who reports to the clinic for monitoring of warfarin treatment.    Indication: History of DVT lower extremitiy, recurrent; History of pulmonary embolism; long term current use of anticoagulant.    Duration: indefinite Supervising physician: Debe Coder  Anticoagulation Clinic Visit History: Patient does not report signs/symptoms of bleeding or thromboembolism  Other recent changes: No diet, medications, lifestyle changes.  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 Penicillins Anaphylaxis, Hives, Swelling and Rash    Has patient had a PCN reaction causing immediate rash, facial/tongue/throat swelling, SOB or lightheadedness with hypotension: Yes Has patient had a PCN reaction causing severe rash involving mucus membranes or skin necrosis: Yes Has patient had a PCN reaction that required hospitalization Yes Has patient had a PCN reaction occurring within the last 10 years: No If all of the above answers are "NO", then may proceed with Cephalosporin use.   . Cabbage Itching  . Keflex [Cephalexin] Hives    And feeling of throat tightness  . Shellfish Allergy Swelling  . Tomato Swelling  . Latex Itching and Rash    Current Outpatient Medications:  .  budesonide-formoterol (SYMBICORT) 160-4.5 MCG/ACT inhaler, Inhale 2 puffs into the lungs 2 (two) times daily., Disp: 3 Inhaler, Rfl: 4 .  calcium citrate-vitamin D (CITRACAL+D) 315-200 MG-UNIT tablet, TAKE 2 TABLETS DAILY BY MOUTH., Disp: 160  tablet, Rfl: 2 .  EPINEPHrine 0.3 mg/0.3 mL IJ SOAJ injection, Inject 0.3 mg into the muscle as needed. Severe allergic reactions, Disp: , Rfl: 0 .  fluticasone (FLONASE) 50 MCG/ACT nasal spray, PLACE 2 SPRAYS DAILY INTO BOTH NOSTRILS., Disp: 16 g, Rfl: 1 .  furosemide (LASIX) 40 MG tablet, Take 1 tablet (40 mg total) by mouth every other day., Disp: 90 tablet, Rfl: 1 .  hydrocortisone (PROCTOSOL HC) 2.5 % rectal cream, Place 1 application rectally 2 (two) times daily., Disp: 30 g, Rfl: 0 .  losartan (COZAAR) 100 MG tablet, Take 1 tablet (100 mg total) by mouth daily., Disp: 90 tablet, Rfl: 2 .  magnesium oxide (MAG-OX) 400 MG tablet, Take 1 tablet (400 mg total) by mouth 2 (two) times daily., Disp: 90 tablet, Rfl: 3 .  meclizine (ANTIVERT) 25 MG tablet, TAKE 1 TABLET BY MOUTH THREE TIMES A DAY AS NEEDED, Disp: 90 tablet, Rfl: 1 .  metFORMIN (GLUCOPHAGE-XR) 500 MG 24 hr tablet, TAKE 1 TABLET BY MOUTH EVERY DAY WITH BREAKFAST, Disp: 90 tablet, Rfl: 1 .  montelukast (SINGULAIR) 10 MG tablet, Take 1 tablet (10 mg total) by mouth at bedtime., Disp: 90 tablet, Rfl: 3 .  pantoprazole (PROTONIX) 40 MG tablet, Take 1 tablet (40 mg total) by mouth daily., Disp: 90 tablet, Rfl: 1 .  phenol (CHLORASEPTIC) 1.4 % LIQD, Use as directed 2 sprays in the mouth or throat as needed for throat irritation / pain., Disp: 29 mL, Rfl: 0 .  PROAIR HFA 108 (90 Base) MCG/ACT  inhaler, TAKE 2 PUFFS BY MOUTH EVERY 6 HOURS AS NEEDED FOR WHEEZE OR SHORTNESS OF BREATH, Disp: 8.5 Inhaler, Rfl: 2 .  RESTASIS 0.05 % ophthalmic emulsion, PLACE 1 DROP INTO BOTH EYES 2 (TWO) TIMES DAILY. USE ONCE DAILY (Patient taking differently: Place 1 drop into both eyes 2 (two) times daily. ), Disp: 60 mL, Rfl: 0 .  rosuvastatin (CRESTOR) 20 MG tablet, Take 1 tablet (20 mg total) by mouth daily., Disp: 90 tablet, Rfl: 3 .  warfarin (COUMADIN) 4 MG tablet, TAKE 1 TAB BY MOUTH DAILY AT 6PM MONDAYS WEDNESDAYS ANS FRIDAYS. ON ALL OTHER DAYS, TAKE ONLY 1/2  TABLET., Disp: 56 tablet, Rfl: 3 .  carvedilol (COREG) 12.5 MG tablet, Take 1 tablet (12.5 mg total) by mouth 2 (two) times daily., Disp: 180 tablet, Rfl: 2 Past Medical History:  Diagnosis Date  . Arthritis   . Asthma   . Bronchitis   . CHF (congestive heart failure) (HCC)   . Diabetes (HCC) 2019  . DVT, lower extremity, recurrent (HCC)    Patient had unprovoked PE on 2002 and DVT in right lower extremety 2008.  [name]. GERD (gastroesophageal reflux disease)   . History of kidney stones   . Hypertension   . PE (pulmonary embolism)    Patient had unprovoked PE on 2002  . PONV (postoperative nausea and vomiting)   . Vertigo    Social History   Socioeconomic History  . Marital status: Divorced    Spouse name: Not on file  . Number of children: 5  . Years of education: 9 th grade  . Highest education level: 9th grade  Occupational History  . Occupation: Merchandiser, retailretail  Social Needs  . Financial resource strain: Somewhat hard  . Food insecurity    Worry: Never true    Inability: Sometimes true  . Transportation needs    Medical: No    Non-medical: Yes  Tobacco Use  . Smoking status: Former Smoker    Types: Cigarettes    Quit date: 09/18/1988    Years since quitting: 29.9  . Smokeless tobacco: Never Used  . Tobacco comment: 6 pack year smoking history as a teen  Substance and Sexual Activity  . Alcohol use: No  . Drug use: No  . Sexual activity: Never    Birth control/protection: None  Lifestyle  . Physical activity    Days per week: 2 days    Minutes per session: 20 min  . Stress: To some extent  Relationships  . Social connections    Talks on phone: More than three times a week    Gets together: Once a week    Attends religious service: More than 4 times per year    Active member of club or organization: No    Attends meetings of clubs or organizations: Never    Relationship status: Divorced  Other Topics Concern  . Not on file  Social History Narrative   Current Social  History 05/17/2017        Patient lives with 71 yo granddaughter" in a/an home / condo / townhome which is 1 story/stories. There are not steps up to the entrance the patient uses.       Patient's method of transportation is church member.      The highest level of education was some high school.      The patient currently retired.      Identified important Relationships are God, family       Pets : 1  lab/pitt mix named Neurosurgeon / Fun: Church,TV       Current Stressors: "Me and my granddaughter"       Religious / Personal Beliefs: I'm Holiness and I love people"       Other: "I'd give away my last dime."    Family History  Problem Relation Age of Onset  . Cancer Mother   . Hypertension Sister   . Diabetes Sister   . Hypertension Brother   . Diabetes Brother   . Hypertension Sister   . Diabetes Sister   . Colon cancer Other   . Heart Problems Other 34       sister's child, open heart surgery  . CVA Father   . Diabetes Son   . Hypertension Son   . Kidney disease Son        on dialysis  . Hypertension Sister   . Diabetes Sister   . Hypertension Sister   . Diabetes Sister   . Cancer Brother   . Hypertension Son   . Diabetes Son   . Hypertension Daughter   . Schizophrenia Daughter     ASSESSMENT Recent Results: The most recent result is correlated with 28 mg per week: Lab Results  Component Value Date   INR 2.7 08/06/2018   INR 2.5 07/09/2018   INR 2.1 06/25/2018    Anticoagulation Dosing: Description   Take one (1) of your 4mg  strength blue warfarin tablets by mouth, once-daily at 6PM, Mondays, Tuesdays, Wednesdays, Thursdays and Fridays. On Saturdays and Sundays, take only one-half (1/2) of your 4mg  strength blue warfarin tablet.      INR today: Therapeutic  PLAN Weekly dose was unchanged.   Patient Instructions  Patient instructed to take medications as defined in the Anti-coagulation Track section of this encounter.  Patient  instructed to take today's dose.  Patient instructed to take one (1) of your 4mg  strength blue warfarin tablets by mouth, once-daily at [facility], Mondays, Tuesdays, Wednesdays, Thursdays and Fridays. On Saturdays and Sundays, take only one-half (1/2) of your 4mg  strength blue warfarin tablet. Patient verbalized understanding of these instructions.     Patient advised to contact clinic or seek medical attention if signs/symptoms of bleeding or thromboembolism occur.  Patient verbalized understanding by repeating back information and was advised to contact me if further medication-related questions arise. Patient was also provided an information handout.  Follow-up Return in 4 weeks (on 09/03/2018) for Follow up INR.  Pennie Banter , PharmD, CPP 15 minutes spent face-to-face with the patient during the encounter. 50% of time spent on education, including signs/sx bleeding and clotting, as well as food and drug interactions with warfarin. 50% of time was spent on fingerprick POC INR sample collection,processing, results determination, and documentation in [email or web address].kim.net/.

## 2018-08-06 NOTE — Progress Notes (Signed)
I have reviewed Dr. Gladstone Pih note.  Patient is on coumadin for VTE recurrent.  INR at goal.  Dose unchanged.

## 2018-08-08 ENCOUNTER — Other Ambulatory Visit: Payer: Self-pay | Admitting: Internal Medicine

## 2018-08-08 DIAGNOSIS — K219 Gastro-esophageal reflux disease without esophagitis: Secondary | ICD-10-CM

## 2018-08-13 ENCOUNTER — Telehealth: Payer: Self-pay

## 2018-08-13 NOTE — Telephone Encounter (Signed)
Requesting to speak with a nurse about getting antibiotic for stomach pain. Please call pt back.  

## 2018-08-13 NOTE — Telephone Encounter (Signed)
Thank you. If she feels worse please have her come to the clinic tomorrow. Thank you

## 2018-08-13 NOTE — Telephone Encounter (Signed)
Called pt - informed per Dr Dareen Piano, she needs to schedule an appt to be eval for abx. Call transferred to front office - [facility] appt scheduled Wed 8/5 @ 0945 AM.

## 2018-08-13 NOTE — Telephone Encounter (Signed)
Please have her cone in to be evaluated so we can assess the need for antibiotics

## 2018-08-13 NOTE — Telephone Encounter (Signed)
Return pt's call - stated diverticulitis has flared up. She's having lower abd pain and cramping on right and left sides. And had some loose stools for 2 days. Stated she had been prescribed abx in the past which helped. Denies fever, cough. Thanks

## 2018-08-15 ENCOUNTER — Other Ambulatory Visit: Payer: Self-pay

## 2018-08-15 ENCOUNTER — Ambulatory Visit (INDEPENDENT_AMBULATORY_CARE_PROVIDER_SITE_OTHER): Payer: Medicare Other | Admitting: Internal Medicine

## 2018-08-15 VITALS — BP 138/60 | HR 77 | Temp 98.0°F | Wt 270.5 lb

## 2018-08-15 DIAGNOSIS — R350 Frequency of micturition: Secondary | ICD-10-CM

## 2018-08-15 DIAGNOSIS — R140 Abdominal distension (gaseous): Secondary | ICD-10-CM

## 2018-08-15 DIAGNOSIS — R110 Nausea: Secondary | ICD-10-CM | POA: Diagnosis not present

## 2018-08-15 DIAGNOSIS — K5730 Diverticulosis of large intestine without perforation or abscess without bleeding: Secondary | ICD-10-CM

## 2018-08-15 DIAGNOSIS — R197 Diarrhea, unspecified: Secondary | ICD-10-CM | POA: Diagnosis not present

## 2018-08-15 DIAGNOSIS — R300 Dysuria: Secondary | ICD-10-CM

## 2018-08-15 DIAGNOSIS — K449 Diaphragmatic hernia without obstruction or gangrene: Secondary | ICD-10-CM

## 2018-08-15 DIAGNOSIS — R1030 Lower abdominal pain, unspecified: Secondary | ICD-10-CM | POA: Diagnosis not present

## 2018-08-15 MED ORDER — METRONIDAZOLE 500 MG PO TABS: 500.0000 mg | ORAL_TABLET | Freq: Three times a day (TID) | ORAL | 0 refills | Status: AC

## 2018-08-15 MED ORDER — CIPROFLOXACIN HCL 500 MG PO TABS: 500.0000 mg | ORAL_TABLET | Freq: Two times a day (BID) | ORAL | 0 refills | Status: AC

## 2018-08-15 NOTE — Patient Instructions (Addendum)
Visit Summary:  It was a pleasure to meet you today. Here are the items we talked about.  1. Abdominal Pain - We will be treating you with a 7 day course of antibiotics. I prescribed Ciprofloxacin 500 mg twice daily and Metronidazole 500 mg three times daily. Please let me know if your symptoms do not improve in the following week. We will do some lab work today to look for an infection. We will call you with the results.  Best regards,  Marianna Payment, DO

## 2018-08-15 NOTE — Progress Notes (Signed)
   CC: Abdominal Pain  HPI:  [name] is a 73 y.o. with a past medical history stated below and presented with a 3 day history of abdominal pain. Please see problem based assessment and plan for more details.  Past Medical History:  Diagnosis Date  . Arthritis   . Asthma   . Bronchitis   . CHF (congestive heart failure) ([place])   . Diabetes ([place]) 2019  . DVT, lower extremity, recurrent ([place])    Patient had unprovoked PE on 2002 and DVT in right lower extremety 2008.  [name] GERD (gastroesophageal reflux disease)   . History of kidney stones   . Hypertension   . PE (pulmonary embolism)    Patient had unprovoked PE on 2002  . PONV (postoperative nausea and vomiting)   . Vertigo    Review of Systems:  Review of Systems  Constitutional: Negative for chills and fever.  Gastrointestinal: Positive for abdominal pain (Pain and bloating), diarrhea (5x loose stools on 08/14/2018) and nausea. Negative for blood in stool, melena and vomiting.  Genitourinary: Positive for dysuria (mild), flank pain (bilateral flanks and radiated to the suprapubic region) and frequency (mild). Negative for hematuria.  Musculoskeletal: Negative for myalgias.     Physical Exam:  Vitals:   08/15/18 0937  BP: 138/60  Pulse: 77  Temp: 98 F (36.7 C)  TempSrc: Oral  SpO2: 100%  Weight: 270 lb 8 oz (122.7 kg)   Lifestyle  Physical activity  . Days per week: 2 days  . Minutes per session: 20 min     Physical Exam  Constitutional: She is oriented to person, place, and time and well-developed, well-nourished, and in no distress.  Cardiovascular: Normal rate, regular rhythm, normal heart sounds and intact distal pulses. Exam reveals no gallop and no friction rub.  No murmur heard. Pulmonary/Chest: Effort normal and breath sounds normal.  Abdominal: Soft. Bowel sounds are normal. She exhibits no distension and no mass. There is abdominal tenderness (mild point tenderness in suprapubic region). There is  no rebound and no guarding.  Neurological: She is alert and oriented to person, place, and time.    Assessment & Plan:   See Encounters Tab for problem based charting.  Patient seen with Dr. Lynnae January

## 2018-08-16 ENCOUNTER — Telehealth: Payer: Self-pay | Admitting: Pharmacist

## 2018-08-16 ENCOUNTER — Encounter: Payer: Self-pay | Admitting: Internal Medicine

## 2018-08-16 LAB — BMP8+ANION GAP
Anion Gap: 15 mmol/L (ref 10.0–18.0)
BUN/Creatinine Ratio: 18 (ref 12–28)
BUN: 14 mg/dL (ref 8–27)
CO2: 28 mmol/L (ref 20–29)
Calcium: 9.5 mg/dL (ref 8.7–10.3)
Chloride: 98 mmol/L (ref 96–106)
Creatinine, Ser: 0.76 mg/dL (ref 0.57–1.00)
GFR calc Af Amer: 91 mL/min/{1.73_m2} (ref >59)
GFR calc non Af Amer: 79 mL/min/{1.73_m2} (ref >59)
Glucose: 123 mg/dL — ABNORMAL HIGH (ref 65–99)
Potassium: 3.6 mmol/L (ref 3.5–5.2)
Sodium: 141 mmol/L (ref 134–144)

## 2018-08-16 LAB — CBC
Hematocrit: 33.6 % — ABNORMAL LOW (ref 34.0–46.6)
Hemoglobin: 10.3 g/dL — ABNORMAL LOW (ref 11.1–15.9)
MCH: 24.3 pg — ABNORMAL LOW (ref 26.6–33.0)
MCHC: 30.7 g/dL — ABNORMAL LOW (ref 31.5–35.7)
MCV: 79 fL (ref 79–97)
Platelets: 187 10*3/uL (ref 150–450)
RBC: 4.23 x10E6/uL (ref 3.77–5.28)
RDW: 14.6 % (ref 11.7–15.4)
WBC: 6.9 10*3/uL (ref 3.4–10.8)

## 2018-08-16 LAB — URINALYSIS, ROUTINE W REFLEX MICROSCOPIC
Bilirubin, UA: NEGATIVE
Glucose, UA: NEGATIVE
Ketones, UA: NEGATIVE
Nitrite, UA: NEGATIVE
RBC, UA: NEGATIVE
Specific Gravity, UA: 1.026 (ref 1.005–1.030)
Urobilinogen, Ur: 0.2 mg/dL (ref 0.2–1.0)
pH, UA: 5 (ref 5.0–7.5)

## 2018-08-16 LAB — MICROSCOPIC EXAMINATION
Casts: NONE SEEN /lpf
RBC: NONE SEEN /hpf (ref 0–2)

## 2018-08-16 NOTE — Telephone Encounter (Signed)
Called this morning at 0756 to my cell phone [phone]). Patient reports that she is to commence upon metronidazole and ciprofloxicin today for 7 days she states, for diverticulitis. Advised to OMIT today's dose of warfarin.  Patient was contacted by phone at 4:20PM today and advised: OMIT today's dose; decrease by 50% her usual scheduled dose for Friday, Saturday and Sunday---rather than taking 4mg  as scheduled, she will ONLY take 1/2 x 4mg  (2mg  dose) on Friday 17-Aug-2018; 1/2 x 4mg  (2mg  dose) on Saturday 18-Aug-2018; 1/2 x 4mg  (2mg  dose) on Sunday 19-Aug-2018; Return to [facility] on Monday 20-Aug-2018 for repeat INR determination. If blood in urine, stool, nose-bleeds, throwing up blood, bleeding anywhere or any type were to occur between now and Monday, please call me at [phone] or report the Emergency Department.

## 2018-08-16 NOTE — Assessment & Plan Note (Signed)
Patient has a history of diverticulosis with multiple episode of diverticulitis. She presented to the ED in 09/2017 with abdominal pain and CT showing:  Stomach/Bowel: Colonic diverticulosis with probable inflamed diverticulum at the junction of the descending and sigmoid colon. Mild associated colonic wall thickening and pericolonic edema. There is a 9 mm adjacent pericolonic node. No free air or abscess. Normal appendix. No other bowel inflammation. No bowel obstruction. Small hiatal hernia.   Her most recent episode was roughly 3 months ago for which she took Cipro/flagyl for 7 days with resolution of her symtpoms.   On today's visit, she admits to 3 days of bilateral flank pain that radiates to her pelvis. The pain is crampy and has slightly decreased in the last 24 hours. She admits to 5 episodes of loose watery stools and decreased appetite. She feels nauseated and bloated. She states that these symptoms feel the same as her previous episodes of diverticulitis. She denies fever, left lower quadrant pain, hematochezia or melena. She admits to mild dysuria and increased urinary frequency. She has been counseled about receiving a colonscopy, but she declines due to her vertigo. I told her to talk to her PCP about getting a colonoscopy as she may be able to tolerate the vertigo under anesthesia.    Plan - Although her symptoms are not exactly consistent with Diverticulitis, I will prescribe Cipro/Flagyl for 7 days based on the patient endorsing the same symptoms as her previous episodes.  - Order UA to rule out UTI or nephrolithiasis - Counseled the patient to talk with PCP and reconsider colonoscopy.

## 2018-08-17 LAB — URINE CULTURE

## 2018-08-17 NOTE — Telephone Encounter (Signed)
I agree

## 2018-08-17 NOTE — Progress Notes (Signed)
Internal Medicine Clinic Attending  I saw and evaluated the patient.  I personally confirmed the key portions of the history and exam documented by Dr. Coe and I reviewed pertinent patient test results.  The assessment, diagnosis, and plan were formulated together and I agree with the documentation in the resident's note.    

## 2018-08-18 LAB — HM DIABETES EYE EXAM

## 2018-08-20 ENCOUNTER — Ambulatory Visit (INDEPENDENT_AMBULATORY_CARE_PROVIDER_SITE_OTHER): Payer: Medicare Other | Admitting: Pharmacist

## 2018-08-20 DIAGNOSIS — Z7901 Long term (current) use of anticoagulants: Secondary | ICD-10-CM

## 2018-08-20 DIAGNOSIS — Z86711 Personal history of pulmonary embolism: Secondary | ICD-10-CM | POA: Diagnosis not present

## 2018-08-20 DIAGNOSIS — Z5181 Encounter for therapeutic drug level monitoring: Secondary | ICD-10-CM | POA: Diagnosis not present

## 2018-08-20 DIAGNOSIS — I82409 Acute embolism and thrombosis of unspecified deep veins of unspecified lower extremity: Secondary | ICD-10-CM | POA: Diagnosis not present

## 2018-08-20 LAB — POCT INR: INR: 2.4 (ref 2.0–3.0)

## 2018-08-20 NOTE — Progress Notes (Signed)
Anticoagulation Management Caroline [name] is a 73 y.o. female who reports to the clinic for monitoring of warfarin treatment.    Indication: DVT' Long term current use of anticoagulant.   Duration: indefinite Supervising physician: Jessy Oto, MD, PhD  Anticoagulation Clinic Visit History: Patient does not report signs/symptoms of bleeding or thromboembolism  Other recent changes: No diet, medications, lifestyle changes EXCEPT AS NOTED in patient findings.  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 Penicillins Anaphylaxis, Hives, Swelling and Rash    Has patient had a PCN reaction causing immediate rash, facial/tongue/throat swelling, SOB or lightheadedness with hypotension: Yes Has patient had a PCN reaction causing severe rash involving mucus membranes or skin necrosis: Yes Has patient had a PCN reaction that required hospitalization Yes Has patient had a PCN reaction occurring within the last 10 years: No If all of the above answers are "NO", then may proceed with Cephalosporin use.   . Cabbage Itching  . Keflex [Cephalexin] Hives    And feeling of throat tightness  . Shellfish Allergy Swelling  . Tomato Swelling  . Latex Itching and Rash    Current Outpatient Medications:  .  budesonide-formoterol (SYMBICORT) 160-4.5 MCG/ACT inhaler, Inhale 2 puffs into the lungs 2 (two) times daily., Disp: 3 Inhaler, Rfl: 4 .  calcium citrate-vitamin D (CITRACAL+D) 315-200 MG-UNIT tablet, TAKE 2 TABLETS DAILY BY MOUTH., Disp: 160 tablet, Rfl: 2 .   ciprofloxacin (CIPRO) 500 MG tablet, Take 1 tablet (500 mg total) by mouth 2 (two) times daily for 7 days., Disp: 14 tablet, Rfl: 0 .  EPINEPHrine 0.3 mg/0.3 mL IJ SOAJ injection, Inject 0.3 mg into the muscle as needed. Severe allergic reactions, Disp: , Rfl: 0 .  fluticasone (FLONASE) 50 MCG/ACT nasal spray, PLACE 2 SPRAYS DAILY INTO BOTH NOSTRILS., Disp: 16 g, Rfl: 1 .  furosemide (LASIX) 40 MG tablet, Take 1 tablet (40 mg total) by mouth every other day., Disp: 90 tablet, Rfl: 1 .  hydrocortisone (PROCTOSOL HC) 2.5 % rectal cream, Place 1 application rectally 2 (two) times daily., Disp: 30 g, Rfl: 0 .  losartan (COZAAR) 100 MG tablet, Take 1 tablet (100 mg total) by mouth daily., Disp: 90 tablet, Rfl: 2 .  magnesium oxide (MAG-OX) 400 MG tablet, Take 1 tablet (400 mg total) by mouth 2 (two) times daily., Disp: 90 tablet, Rfl: 3 .  meclizine (ANTIVERT) 25 MG tablet, TAKE 1 TABLET BY MOUTH THREE TIMES A DAY AS NEEDED, Disp: 90 tablet, Rfl: 1 .  metFORMIN (GLUCOPHAGE-XR) 500 MG 24 hr tablet, TAKE 1 TABLET BY MOUTH EVERY DAY WITH BREAKFAST, Disp: 90 tablet, Rfl: 1 .  metroNIDAZOLE (FLAGYL) 500 MG tablet, Take 1 tablet (500 mg total) by mouth 3 (three) times daily for 7 days., Disp: 21 tablet, Rfl: 0 .  montelukast (SINGULAIR) 10 MG tablet, Take 1 tablet (10 mg total) by mouth at bedtime., Disp: 90 tablet, Rfl: 3 .  pantoprazole (PROTONIX) 40 MG  tablet, TAKE 1 TABLET BY MOUTH EVERY DAY, Disp: 90 tablet, Rfl: 1 .  PROAIR HFA 108 (90 Base) MCG/ACT inhaler, TAKE 2 PUFFS BY MOUTH EVERY 6 HOURS AS NEEDED FOR WHEEZE OR SHORTNESS OF BREATH, Disp: 8.5 Inhaler, Rfl: 2 .  RESTASIS 0.05 % ophthalmic emulsion, PLACE 1 DROP INTO BOTH EYES 2 (TWO) TIMES DAILY. USE ONCE DAILY (Patient taking differently: Place 1 drop into both eyes 2 (two) times daily. ), Disp: 60 mL, Rfl: 0 .  rosuvastatin (CRESTOR) 20 MG tablet, Take 1 tablet (20 mg total) by mouth daily., Disp: 90 tablet, Rfl: 3 .  warfarin (COUMADIN) 4 MG tablet,  TAKE 1 TAB BY MOUTH DAILY AT 6PM MONDAYS WEDNESDAYS ANS FRIDAYS. ON ALL OTHER DAYS, TAKE ONLY 1/2 TABLET., Disp: 56 tablet, Rfl: 3 .  carvedilol (COREG) 12.5 MG tablet, Take 1 tablet (12.5 mg total) by mouth 2 (two) times daily., Disp: 180 tablet, Rfl: 2 .  phenol (CHLORASEPTIC) 1.4 % LIQD, Use as directed 2 sprays in the mouth or throat as needed for throat irritation / pain. (Patient not taking: Reported on 08/20/2018), Disp: 29 mL, Rfl: 0 Past Medical History:  Diagnosis Date  . Arthritis   . Asthma   . Bronchitis   . CHF (congestive heart failure) (HCC)   . Diabetes (HCC) 2019  . DVT, lower extremity, recurrent (HCC)    Patient had unprovoked PE on 2002 and DVT in right lower extremety 2008.  [name]. GERD (gastroesophageal reflux disease)   . History of kidney stones   . Hypertension   . PE (pulmonary embolism)    Patient had unprovoked PE on 2002  . PONV (postoperative nausea and vomiting)   . Vertigo    Social History   Socioeconomic History  . Marital status: Divorced    Spouse name: Not on file  . Number of children: 5  . Years of education: 9 th grade  . Highest education level: 9th grade  Occupational History  . Occupation: Merchandiser, retailretail  Social Needs  . Financial resource strain: Somewhat hard  . Food insecurity    Worry: Never true    Inability: Sometimes true  . Transportation needs    Medical: No    Non-medical: Yes  Tobacco Use  . Smoking status: Former Smoker    Types: Cigarettes    Quit date: 09/18/1988    Years since quitting: 29.9  . Smokeless tobacco: Never Used  . Tobacco comment: 6 pack year smoking history as a teen  Substance and Sexual Activity  . Alcohol use: No  . Drug use: No  . Sexual activity: Never    Birth control/protection: None  Lifestyle  . Physical activity    Days per week: 2 days    Minutes per session: 20 min  . Stress: To some extent  Relationships  . Social connections    Talks on phone: More than three times a week    Gets together:  Once a week    Attends religious service: More than 4 times per year    Active member of club or organization: No    Attends meetings of clubs or organizations: Never    Relationship status: Divorced  Other Topics Concern  . Not on file  Social History Narrative   Current Social History 05/17/2017        Patient lives with 71 yo granddaughter" in a/an home / condo / townhome which is 1 story/stories. There are not steps up to the entrance the patient uses.  Patient's method of transportation is church member.      The highest level of education was some high school.      The patient currently retired.      Identified important Relationships are God, family       Pets : 1 lab/pitt mix named Cinnamon       Interests / Fun: Church,TV       Current Stressors: "Me and my granddaughter"       Religious / Personal Beliefs: I'm Holiness and I love people"       Other: "I'd give away my last dime."    Family History  Problem Relation Age of Onset  . Cancer Mother   . Hypertension Sister   . Diabetes Sister   . Hypertension Brother   . Diabetes Brother   . Hypertension Sister   . Diabetes Sister   . Colon cancer Other   . Heart Problems Other 34       sister's child, open heart surgery  . CVA Father   . Diabetes Son   . Hypertension Son   . Kidney disease Son        on dialysis  . Hypertension Sister   . Diabetes Sister   . Hypertension Sister   . Diabetes Sister   . Cancer Brother   . Hypertension Son   . Diabetes Son   . Hypertension Daughter   . Schizophrenia Daughter     ASSESSMENT Recent Results: The most recent result is correlated with  18 mg per week--including an omitted dose due to known drug-drug-interactions with two antibiotics (metronidazole and ciprofloxicin for which she is on for diverticulitis, schedule for discontinuation in two days: Lab Results  Component Value Date   INR 2.4 08/20/2018   INR 2.7 08/06/2018   INR 2.5 07/09/2018     Anticoagulation Dosing: Description   Take 1/2 tablet daily--except on Saturday and Sunday, take one (1) tablet.      INR today: Therapeutic  PLAN Weekly dose was unchanged. She will remain on 18 mg per week (previously on higher weekly dose.   Patient Instructions  Patient instructed to take medications as defined in the Anti-coagulation Track section of this encounter.  Patient instructed to take today's dose.  Patient instructed to take 1/2 tablet daily--except on Saturday and Sunday, take one (1) tablet on these two days.  Patient instructed to return to clinic on Monday 27-Aug-2018 at 1015h for repeat INR after antibiotics have been discontinued.  Patient verbalized understanding of these instructions.     Patient advised to contact clinic or seek medical attention if signs/symptoms of bleeding or thromboembolism occur.  Patient verbalized understanding by repeating back information and was advised to contact me if further medication-related questions arise. Patient was also provided an information handout.  Follow-up Return in 1 week (on 08/27/2018) for Follow up INR.  Pennie Banter , PharmD, CPP 15 minutes spent face-to-face with the patient during the encounter. 50% of time spent on education, including signs/sx bleeding and clotting, as well as food and drug interactions with warfarin. 50% of time was spent on fingerprick POC INR sample collection,processing, results determination, and documentation in [email or web address]

## 2018-08-20 NOTE — Patient Instructions (Signed)
Patient instructed to take medications as defined in the Anti-coagulation Track section of this encounter.  Patient instructed to take today's dose.  Patient instructed to take 1/2 tablet daily--except on Saturday and Sunday, take one (1) tablet on these two days.  Patient instructed to return to clinic on Monday 27-Aug-2018 at 1015h for repeat INR after antibiotics have been discontinued.  Patient verbalized understanding of these instructions.

## 2018-08-20 NOTE — Progress Notes (Signed)
INTERNAL MEDICINE TEACHING ATTENDING ADDENDUM  I agree with pharmacy recommendations as outlined in their note.   Alexander N Raines, MD  

## 2018-08-27 ENCOUNTER — Other Ambulatory Visit: Payer: Self-pay | Admitting: Pharmacist

## 2018-08-27 ENCOUNTER — Ambulatory Visit (INDEPENDENT_AMBULATORY_CARE_PROVIDER_SITE_OTHER): Payer: Medicare Other | Admitting: Pharmacist

## 2018-08-27 ENCOUNTER — Other Ambulatory Visit: Payer: Self-pay

## 2018-08-27 DIAGNOSIS — Z5181 Encounter for therapeutic drug level monitoring: Secondary | ICD-10-CM

## 2018-08-27 DIAGNOSIS — Z7901 Long term (current) use of anticoagulants: Secondary | ICD-10-CM

## 2018-08-27 DIAGNOSIS — I82409 Acute embolism and thrombosis of unspecified deep veins of unspecified lower extremity: Secondary | ICD-10-CM

## 2018-08-27 DIAGNOSIS — Z86718 Personal history of other venous thrombosis and embolism: Secondary | ICD-10-CM

## 2018-08-27 DIAGNOSIS — I824Y9 Acute embolism and thrombosis of unspecified deep veins of unspecified proximal lower extremity: Secondary | ICD-10-CM

## 2018-08-27 LAB — POCT INR: INR: 2.2 (ref 2.0–3.0)

## 2018-08-27 MED ORDER — WARFARIN SODIUM 4 MG PO TABS: ORAL_TABLET | ORAL | 3 refills | Status: DC

## 2018-08-27 NOTE — Progress Notes (Signed)
Anticoagulation Management Caroline [name] is a 73 y.o. female who reports to the clinic for monitoring of warfarin treatment.    Indication: DVT, History of with recurrence' Pulmonary embolism-History of (resolved); Long term current use of anticoagulant.   Duration: indefinite Supervising physician: Earl LagosNischal Narendra  Anticoagulation Clinic Visit History: Patient does not report signs/symptoms of bleeding or thromboembolism  Other recent changes: No diet, medications, lifestyle changes.  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 Penicillins Anaphylaxis, Hives, Swelling and Rash    Has patient had a PCN reaction causing immediate rash, facial/tongue/throat swelling, SOB or lightheadedness with hypotension: Yes Has patient had a PCN reaction causing severe rash involving mucus membranes or skin necrosis: Yes Has patient had a PCN reaction that required hospitalization Yes Has patient had a PCN reaction occurring within the last 10 years: No If all of the above answers are "NO", then may proceed with Cephalosporin use.   . Cabbage Itching  . Keflex [Cephalexin] Hives    And feeling of throat tightness  . Shellfish Allergy Swelling  . Tomato Swelling  . Latex Itching and Rash    Current Outpatient Medications:  .  budesonide-formoterol (SYMBICORT) 160-4.5 MCG/ACT inhaler, Inhale 2 puffs into the lungs 2 (two) times daily., Disp: 3 Inhaler, Rfl: 4 .  calcium citrate-vitamin D (CITRACAL+D) 315-200 MG-UNIT tablet, TAKE 2 TABLETS DAILY BY MOUTH., Disp:  160 tablet, Rfl: 2 .  EPINEPHrine 0.3 mg/0.3 mL IJ SOAJ injection, Inject 0.3 mg into the muscle as needed. Severe allergic reactions, Disp: , Rfl: 0 .  fluticasone (FLONASE) 50 MCG/ACT nasal spray, PLACE 2 SPRAYS DAILY INTO BOTH NOSTRILS., Disp: 16 g, Rfl: 1 .  furosemide (LASIX) 40 MG tablet, Take 1 tablet (40 mg total) by mouth every other day., Disp: 90 tablet, Rfl: 1 .  hydrocortisone (PROCTOSOL HC) 2.5 % rectal cream, Place 1 application rectally 2 (two) times daily., Disp: 30 g, Rfl: 0 .  losartan (COZAAR) 100 MG tablet, Take 1 tablet (100 mg total) by mouth daily., Disp: 90 tablet, Rfl: 2 .  magnesium oxide (MAG-OX) 400 MG tablet, Take 1 tablet (400 mg total) by mouth 2 (two) times daily., Disp: 90 tablet, Rfl: 3 .  meclizine (ANTIVERT) 25 MG tablet, TAKE 1 TABLET BY MOUTH THREE TIMES A DAY AS NEEDED, Disp: 90 tablet, Rfl: 1 .  metFORMIN (GLUCOPHAGE-XR) 500 MG 24 hr tablet, TAKE 1 TABLET BY MOUTH EVERY DAY WITH BREAKFAST, Disp: 90 tablet, Rfl: 1 .  montelukast (SINGULAIR) 10 MG tablet, Take 1 tablet (10 mg total) by mouth at bedtime., Disp: 90 tablet, Rfl: 3 .  pantoprazole (PROTONIX) 40 MG tablet, TAKE 1 TABLET BY MOUTH EVERY DAY, Disp: 90 tablet, Rfl: 1 .  phenol (CHLORASEPTIC) 1.4 % LIQD, Use as directed 2 sprays in the mouth or throat as needed for throat irritation / pain., Disp: 29 mL, Rfl: 0 .  PROAIR HFA 108 (90 Base) MCG/ACT inhaler, TAKE 2 PUFFS  BY MOUTH EVERY 6 HOURS AS NEEDED FOR WHEEZE OR SHORTNESS OF BREATH, Disp: 8.5 Inhaler, Rfl: 2 .  RESTASIS 0.05 % ophthalmic emulsion, PLACE 1 DROP INTO BOTH EYES 2 (TWO) TIMES DAILY. USE ONCE DAILY (Patient taking differently: Place 1 drop into both eyes 2 (two) times daily. ), Disp: 60 mL, Rfl: 0 .  rosuvastatin (CRESTOR) 20 MG tablet, Take 1 tablet (20 mg total) by mouth daily., Disp: 90 tablet, Rfl: 3 .  warfarin (COUMADIN) 4 MG tablet, TAKE 1 TAB BY MOUTH DAILY AT 6PM MONDAYS WEDNESDAYS ANS FRIDAYS. ON ALL OTHER DAYS, TAKE ONLY 1/2  TABLET., Disp: 56 tablet, Rfl: 3 .  carvedilol (COREG) 12.5 MG tablet, Take 1 tablet (12.5 mg total) by mouth 2 (two) times daily., Disp: 180 tablet, Rfl: 2 Past Medical History:  Diagnosis Date  . Arthritis   . Asthma   . Bronchitis   . CHF (congestive heart failure) (HCC)   . Diabetes (HCC) 2019  . DVT, lower extremity, recurrent (HCC)    Patient had unprovoked PE on 2002 and DVT in right lower extremety 2008.  [name] GERD (gastroesophageal reflux disease)   . History of kidney stones   . Hypertension   . PE (pulmonary embolism)    Patient had unprovoked PE on 2002  . PONV (postoperative nausea and vomiting)   . Vertigo    Social History   Socioeconomic History  . Marital status: Divorced    Spouse name: Not on file  . Number of children: 5  . Years of education: 9 th grade  . Highest education level: 9th grade  Occupational History  . Occupation: Merchandiser, retail  . Financial resource strain: Somewhat hard  . Food insecurity    Worry: Never true    Inability: Sometimes true  . Transportation needs    Medical: No    Non-medical: Yes  Tobacco Use  . Smoking status: Former Smoker    Types: Cigarettes    Quit date: 09/18/1988    Years since quitting: 29.9  . Smokeless tobacco: Never Used  . Tobacco comment: 6 pack year smoking history as a teen  Substance and Sexual Activity  . Alcohol use: No  . Drug use: No  . Sexual activity: Never    Birth control/protection: None  Lifestyle  . Physical activity    Days per week: 2 days    Minutes per session: 20 min  . Stress: To some extent  Relationships  . Social connections    Talks on phone: More than three times a week    Gets together: Once a week    Attends religious service: More than 4 times per year    Active member of club or organization: No    Attends meetings of clubs or organizations: Never    Relationship status: Divorced  Other Topics Concern  . Not on file  Social History Narrative   Current Social  History 05/17/2017        Patient lives with 65 yo granddaughter" in a/an home / condo / townhome which is 1 story/stories. There are not steps up to the entrance the patient uses.       Patient's method of transportation is church member.      The highest level of education was some high school.      The patient currently retired.      Identified important Relationships are God, family       Pets : 1 lab/pitt mix named Cinnamon  Interests / Fun: Church,TV       Current Stressors: "Me and my granddaughter"       Religious / Personal Beliefs: I'm Holiness and I love people"       Other: "I'd give away my last dime."    Family History  Problem Relation Age of Onset  . Cancer Mother   . Hypertension Sister   . Diabetes Sister   . Hypertension Brother   . Diabetes Brother   . Hypertension Sister   . Diabetes Sister   . Colon cancer Other   . Heart Problems Other 34       sister's child, open heart surgery  . CVA Father   . Diabetes Son   . Hypertension Son   . Kidney disease Son        on dialysis  . Hypertension Sister   . Diabetes Sister   . Hypertension Sister   . Diabetes Sister   . Cancer Brother   . Hypertension Son   . Diabetes Son   . Hypertension Daughter   . Schizophrenia Daughter     ASSESSMENT Recent Results: The most recent result is correlated with   18 mg per week: Lab Results  Component Value Date   INR 2.2 08/27/2018   INR 2.4 08/20/2018   INR 2.7 08/06/2018    Anticoagulation Dosing: Description   Take 1/2 tablet daily--except on Mondays, Wednesdays and Fridays, take one (1) tablet on these days.      INR today: Therapeutic  PLAN Weekly dose was increased by 10% to 20 mg per week  Patient Instructions  Patient instructed to take medications as defined in the Anti-coagulation Track section of this encounter.  Patient instructed to take today's dose.  Patient instructed to take 1/2 tablet daily--except on Mondays, Wednesdays and  Fridays, take one (1) tablet on these days.  Patient verbalized understanding of these instructions.     Patient advised to contact clinic or seek medical attention if signs/symptoms of bleeding or thromboembolism occur.  Patient verbalized understanding by repeating back information and was advised to contact me if further medication-related questions arise. Patient was also provided an information handout.  Follow-up Return in 4 weeks (on 09/24/2018) for Follow up INR.  Pennie Banter, PharmD, CPP  15 minutes spent face-to-face with the patient during the encounter. 50% of time spent on education, including signs/sx bleeding and clotting, as well as food and drug interactions with warfarin. 50% of time was spent on fingerprick POC INR sample collection,processing, results determination, and documentation in [email or web address]/.

## 2018-08-27 NOTE — Patient Instructions (Signed)
Patient instructed to take medications as defined in the Anti-coagulation Track section of this encounter.  Patient instructed to take today's dose.  Patient instructed to take 1/2 tablet daily--except on Mondays, Wednesdays and Fridays, take one (1) tablet on these days.  Patient verbalized understanding of these instructions.

## 2018-08-27 NOTE — Progress Notes (Signed)
INTERNAL MEDICINE TEACHING ATTENDING ADDENDUM - Musette Kisamore M.D  Duration- indefinite, Indication- recurrent VTE, INR- therapeutic. Agree with pharmacy recommendations as outlined in their note.     

## 2018-08-31 ENCOUNTER — Other Ambulatory Visit: Payer: Self-pay | Admitting: Internal Medicine

## 2018-08-31 DIAGNOSIS — R42 Dizziness and giddiness: Secondary | ICD-10-CM

## 2018-09-03 ENCOUNTER — Ambulatory Visit: Payer: Medicare Other

## 2018-09-24 ENCOUNTER — Ambulatory Visit (INDEPENDENT_AMBULATORY_CARE_PROVIDER_SITE_OTHER): Payer: Medicare Other | Admitting: *Deleted

## 2018-09-24 ENCOUNTER — Ambulatory Visit (INDEPENDENT_AMBULATORY_CARE_PROVIDER_SITE_OTHER): Payer: Medicare Other

## 2018-09-24 ENCOUNTER — Other Ambulatory Visit: Payer: Self-pay

## 2018-09-24 DIAGNOSIS — Z86711 Personal history of pulmonary embolism: Secondary | ICD-10-CM

## 2018-09-24 DIAGNOSIS — I82409 Acute embolism and thrombosis of unspecified deep veins of unspecified lower extremity: Secondary | ICD-10-CM | POA: Diagnosis not present

## 2018-09-24 DIAGNOSIS — Z23 Encounter for immunization: Secondary | ICD-10-CM | POA: Diagnosis not present

## 2018-09-24 DIAGNOSIS — Z7901 Long term (current) use of anticoagulants: Secondary | ICD-10-CM | POA: Diagnosis not present

## 2018-09-24 DIAGNOSIS — Z5181 Encounter for therapeutic drug level monitoring: Secondary | ICD-10-CM

## 2018-09-24 LAB — POCT INR: INR: 1.7 — AB (ref 2.0–3.0)

## 2018-09-24 NOTE — Progress Notes (Signed)
Anticoagulation Management Caroline [name] is a 73 y.o. female who reports to the clinic for monitoring of warfarin treatment.    Indication: DVT lower extremity, recurrent; PE, history of; long-term anticoagulation.   Duration: indefinite Supervising physician: Carlynn Purl  Anticoagulation Clinic Visit History: Patient does not report signs/symptoms of bleeding or thromboembolism. Other recent changes: no diet, medications, lifestyle changes endorsed.  Anticoagulation Episode Summary    Current INR goal:  2.0-3.0  TTR:  79.6 % (3.6 y)  Next INR check:  10/15/2018  INR from last check:  1.7 (09/24/2018)  Weekly max warfarin dose:    Target end date:    INR check location:  Anticoagulation Clinic  Preferred lab:    Send INR reminders to:  [facility]   Indications   DVT lower extremity recurrent (HCC) [I82.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 Penicillins Anaphylaxis, Hives, Swelling and Rash    Has patient had a PCN reaction causing immediate rash, facial/tongue/throat swelling, SOB or lightheadedness with hypotension: Yes Has patient had a PCN reaction causing severe rash involving mucus membranes or skin necrosis: Yes Has patient had a PCN reaction that required hospitalization Yes Has patient had a PCN reaction occurring within the last 10 years: No If all of the above answers are "NO", then may proceed with Cephalosporin use.   . Cabbage Itching  . Keflex [Cephalexin] Hives    And feeling of throat tightness  . Shellfish Allergy Swelling  . Tomato Swelling  . Latex Itching and Rash    Current Outpatient Medications:  .  budesonide-formoterol (SYMBICORT) 160-4.5 MCG/ACT inhaler, Inhale 2 puffs into the lungs 2 (two) times daily., Disp: 3 Inhaler, Rfl: 4 .  calcium citrate-vitamin D (CITRACAL+D) 315-200 MG-UNIT tablet, TAKE 2 TABLETS DAILY BY MOUTH., Disp: 160 tablet, Rfl: 2 .  EPINEPHrine  0.3 mg/0.3 mL IJ SOAJ injection, Inject 0.3 mg into the muscle as needed. Severe allergic reactions, Disp: , Rfl: 0 .  fluticasone (FLONASE) 50 MCG/ACT nasal spray, PLACE 2 SPRAYS DAILY INTO BOTH NOSTRILS., Disp: 16 g, Rfl: 1 .  furosemide (LASIX) 40 MG tablet, Take 1 tablet (40 mg total) by mouth every other day., Disp: 90 tablet, Rfl: 1 .  hydrocortisone (PROCTOSOL HC) 2.5 % rectal cream, Place 1 application rectally 2 (two) times daily., Disp: 30 g, Rfl: 0 .  losartan (COZAAR) 100 MG tablet, Take 1 tablet (100 mg total) by mouth daily., Disp: 90 tablet, Rfl: 2 .  magnesium oxide (MAG-OX) 400 MG tablet, Take 1 tablet (400 mg total) by mouth 2 (two) times daily., Disp: 90 tablet, Rfl: 3 .  meclizine (ANTIVERT) 25 MG tablet, TAKE 1 TABLET BY MOUTH THREE TIMES A DAY AS NEEDED, Disp: 90 tablet, Rfl: 1 .  metFORMIN (GLUCOPHAGE-XR) 500 MG 24 hr tablet, TAKE 1 TABLET BY MOUTH EVERY DAY WITH BREAKFAST, Disp: 90 tablet, Rfl: 1 .  montelukast (SINGULAIR) 10 MG tablet, Take 1 tablet (10 mg total) by mouth at bedtime., Disp: 90 tablet, Rfl: 3 .  pantoprazole (PROTONIX) 40 MG tablet, TAKE 1 TABLET BY MOUTH EVERY DAY, Disp: 90 tablet, Rfl: 1 .  phenol (CHLORASEPTIC) 1.4 % LIQD, Use as directed 2 sprays in the mouth or throat as needed for throat irritation / pain., Disp: 29 mL, Rfl: 0 .  PROAIR HFA 108 (90 Base) MCG/ACT inhaler, TAKE 2 PUFFS BY MOUTH EVERY 6 HOURS AS  NEEDED FOR WHEEZE OR SHORTNESS OF BREATH, Disp: 8.5 Inhaler, Rfl: 2 .  RESTASIS 0.05 % ophthalmic emulsion, PLACE 1 DROP INTO BOTH EYES 2 (TWO) TIMES DAILY. USE ONCE DAILY (Patient taking differently: Place 1 drop into both eyes 2 (two) times daily. ), Disp: 60 mL, Rfl: 0 .  rosuvastatin (CRESTOR) 20 MG tablet, Take 1 tablet (20 mg total) by mouth daily., Disp: 90 tablet, Rfl: 3 .  warfarin (COUMADIN) 4 MG tablet, TAKE 1 TAB BY MOUTH DAILY AT 6PM [facility]. ON ALL OTHER DAYS, TAKE ONLY 1/2 TABLET., Disp: 60 tablet, Rfl: 3 .   carvedilol (COREG) 12.5 MG tablet, Take 1 tablet (12.5 mg total) by mouth 2 (two) times daily., Disp: 180 tablet, Rfl: 2 Past Medical History:  Diagnosis Date  . Arthritis   . Asthma   . Bronchitis   . CHF (congestive heart failure) (HCC)   . Diabetes (HCC) 2019  . DVT, lower extremity, recurrent (HCC)    Patient had unprovoked PE on 2002 and DVT in right lower extremety 2008.  [name]. GERD (gastroesophageal reflux disease)   . History of kidney stones   . Hypertension   . PE (pulmonary embolism)    Patient had unprovoked PE on 2002  . PONV (postoperative nausea and vomiting)   . Vertigo    Social History   Socioeconomic History  . Marital status: Divorced    Spouse name: Not on file  . Number of children: 5  . Years of education: 9 th grade  . Highest education level: 9th grade  Occupational History  . Occupation: Merchandiser, retailretail  Social Needs  . Financial resource strain: Somewhat hard  . Food insecurity    Worry: Never true    Inability: Sometimes true  . Transportation needs    Medical: No    Non-medical: Yes  Tobacco Use  . Smoking status: Former Smoker    Types: Cigarettes    Quit date: 09/18/1988    Years since quitting: 30.0  . Smokeless tobacco: Never Used  . Tobacco comment: 6 pack year smoking history as a teen  Substance and Sexual Activity  . Alcohol use: No  . Drug use: No  . Sexual activity: Never    Birth control/protection: None  Lifestyle  . Physical activity    Days per week: 2 days    Minutes per session: 20 min  . Stress: To some extent  Relationships  . Social connections    Talks on phone: More than three times a week    Gets together: Once a week    Attends religious service: More than 4 times per year    Active member of club or organization: No    Attends meetings of clubs or organizations: Never    Relationship status: Divorced  Other Topics Concern  . Not on file  Social History Narrative   Current Social History 05/17/2017        Patient  lives with 71 yo granddaughter" in a/an home / condo / townhome which is 1 story/stories. There are not steps up to the entrance the patient uses.       Patient's method of transportation is church member.      The highest level of education was some high school.      The patient currently retired.      Identified important Relationships are God, family       Pets : 1 lab/pitt mix named Cinnamon  Interests / Fun: Church,TV       Current Stressors: "Me and my granddaughter"       Religious / Personal Beliefs: I'm Holiness and I love people"       Other: "I'd give away my last dime."    Family History  Problem Relation Age of Onset  . Cancer Mother   . Hypertension Sister   . Diabetes Sister   . Hypertension Brother   . Diabetes Brother   . Hypertension Sister   . Diabetes Sister   . Colon cancer Other   . Heart Problems Other 34       sister's child, open heart surgery  . CVA Father   . Diabetes Son   . Hypertension Son   . Kidney disease Son        on dialysis  . Hypertension Sister   . Diabetes Sister   . Hypertension Sister   . Diabetes Sister   . Cancer Brother   . Hypertension Son   . Diabetes Son   . Hypertension Daughter   . Schizophrenia Daughter     ASSESSMENT Recent Results: The most recent result is correlated with 20 mg per week: Lab Results  Component Value Date   INR 1.7 (A) 09/24/2018   INR 2.2 08/27/2018   INR 2.4 08/20/2018    Anticoagulation Dosing: Description   Take one (1) tablet daily--except on Saturday and Sunday, take half (1/2)  tablet on these days.      INR today: Subtherapeutic  PLAN Weekly dose was increased by 20% to 24 mg per week  Patient Instructions  Patient instructed to take medications as defined in the Anti-coagulation Track section of this encounter.  Patient instructed to take today's dose.  Patient instructed to take one (1) tablet daily--except on Saturday and Sunday, take half (1/2)  tablet on these  days.  Patient verbalized understanding of these instructions.      Patient advised to contact clinic or seek medical attention if signs/symptoms of bleeding or thromboembolism occur.  Patient verbalized understanding by repeating back information and was advised to contact me if further medication-related questions arise. Patient was also provided an information handout.  Follow-up Return in 3 weeks (on 10/15/2018) for Follow-up INR at 1015.  Acey Lav, PharmD  PGY1 Acute Care Pharmacy Resident [phone]  15 minutes spent face-to-face with the patient during the encounter. 50% of time spent on education, including signs/sx bleeding and clotting, as well as food and drug interactions with warfarin. 50% of time was spent on fingerprick POC INR sample collection,processing, results determination, and documentation in [email or web address]/.

## 2018-09-24 NOTE — Patient Instructions (Signed)
Patient instructed to take medications as defined in the Anti-coagulation Track section of this encounter.  Patient instructed to take today's dose.  Patient instructed to take one (1) tablet daily--except on Saturday and Sunday, take half (1/2)  tablet on these days.  Patient verbalized understanding of these instructions.

## 2018-09-30 DIAGNOSIS — H5213 Myopia, bilateral: Secondary | ICD-10-CM | POA: Diagnosis not present

## 2018-10-08 ENCOUNTER — Other Ambulatory Visit: Payer: Self-pay | Admitting: *Deleted

## 2018-10-08 DIAGNOSIS — J4521 Mild intermittent asthma with (acute) exacerbation: Secondary | ICD-10-CM

## 2018-10-08 MED ORDER — BUDESONIDE-FORMOTEROL FUMARATE 160-4.5 MCG/ACT IN AERO: 2.0000 | INHALATION_SPRAY | Freq: Two times a day (BID) | RESPIRATORY_TRACT | 4 refills | Status: DC

## 2018-10-08 NOTE — Telephone Encounter (Signed)
Next appt scheduled 10/5 with PCP. 

## 2018-10-15 ENCOUNTER — Other Ambulatory Visit: Payer: Self-pay

## 2018-10-15 ENCOUNTER — Ambulatory Visit (INDEPENDENT_AMBULATORY_CARE_PROVIDER_SITE_OTHER): Payer: Medicare Other | Admitting: Pharmacist

## 2018-10-15 DIAGNOSIS — Z7901 Long term (current) use of anticoagulants: Secondary | ICD-10-CM

## 2018-10-15 DIAGNOSIS — I82409 Acute embolism and thrombosis of unspecified deep veins of unspecified lower extremity: Secondary | ICD-10-CM

## 2018-10-15 DIAGNOSIS — Z5181 Encounter for therapeutic drug level monitoring: Secondary | ICD-10-CM | POA: Diagnosis not present

## 2018-10-15 DIAGNOSIS — Z86718 Personal history of other venous thrombosis and embolism: Secondary | ICD-10-CM

## 2018-10-15 DIAGNOSIS — I824Y9 Acute embolism and thrombosis of unspecified deep veins of unspecified proximal lower extremity: Secondary | ICD-10-CM

## 2018-10-15 LAB — POCT INR: INR: 2.9 (ref 2.0–3.0)

## 2018-10-15 MED ORDER — WARFARIN SODIUM 4 MG PO TABS: ORAL_TABLET | ORAL | 1 refills | Status: DC

## 2018-10-15 NOTE — Progress Notes (Signed)
INTERNAL MEDICINE TEACHING ATTENDING ADDENDUM - Kamon Fahr M.D  Duration- indefinite, Indication- recurrent VTE, INR- therapeutic. Agree with pharmacy recommendations as outlined in their note.     

## 2018-10-15 NOTE — Patient Instructions (Signed)
Patient instructed to take medications as defined in the Anti-coagulation Track section of this encounter.  Patient instructed to take today's dose.  Patient instructed to take one (1) tablet daily--except on Sundays, Wednesdays and Saturddays, take half (1/2)  tablet on these days. Patient verbalized understanding of these instructions.   

## 2018-10-15 NOTE — Progress Notes (Signed)
Anticoagulation Management Caroline [name] is a 73 y.o. female who reports to the clinic for monitoring of warfarin treatment.    Indication: DVT, history of (resolved); Long term current use of anticoagulant.   Duration: indefinite Supervising physician: Earl Lagos  Anticoagulation Clinic Visit History: Patient does not report signs/symptoms of bleeding or thromboembolism  Other recent changes: No diet, medications, lifestyle changes endorsed at this visit.  Anticoagulation Episode Summary    Current INR goal:  2.0-3.0  TTR:  79.5 % (3.7 y)  Next INR check:  11/12/2018  INR from last check:    Weekly max warfarin dose:    Target end date:    INR check location:  Anticoagulation Clinic  Preferred lab:    Send INR reminders to:  [facility]   Indications   DVT lower extremity recurrent (HCC) [I82.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 Penicillins Anaphylaxis, Hives, Swelling and Rash    Has patient had a PCN reaction causing immediate rash, facial/tongue/throat swelling, SOB or lightheadedness with hypotension: Yes Has patient had a PCN reaction causing severe rash involving mucus membranes or skin necrosis: Yes Has patient had a PCN reaction that required hospitalization Yes Has patient had a PCN reaction occurring within the last 10 years: No If all of the above answers are "NO", then may proceed with Cephalosporin use.   . Cabbage Itching  . Keflex [Cephalexin] Hives    And feeling of throat tightness  . Shellfish Allergy Swelling  . Tomato Swelling  . Latex Itching and Rash    Current Outpatient Medications:  .  budesonide-formoterol (SYMBICORT) 160-4.5 MCG/ACT inhaler, Inhale 2 puffs into the lungs 2 (two) times daily., Disp: 3 Inhaler, Rfl: 4 .  calcium citrate-vitamin D (CITRACAL+D) 315-200 MG-UNIT tablet, TAKE 2 TABLETS DAILY BY MOUTH., Disp: 160 tablet, Rfl: 2 .  EPINEPHrine 0.3  mg/0.3 mL IJ SOAJ injection, Inject 0.3 mg into the muscle as needed. Severe allergic reactions, Disp: , Rfl: 0 .  fluticasone (FLONASE) 50 MCG/ACT nasal spray, PLACE 2 SPRAYS DAILY INTO BOTH NOSTRILS., Disp: 16 g, Rfl: 1 .  furosemide (LASIX) 40 MG tablet, Take 1 tablet (40 mg total) by mouth every other day., Disp: 90 tablet, Rfl: 1 .  hydrocortisone (PROCTOSOL HC) 2.5 % rectal cream, Place 1 application rectally 2 (two) times daily., Disp: 30 g, Rfl: 0 .  losartan (COZAAR) 100 MG tablet, Take 1 tablet (100 mg total) by mouth daily., Disp: 90 tablet, Rfl: 2 .  magnesium oxide (MAG-OX) 400 MG tablet, Take 1 tablet (400 mg total) by mouth 2 (two) times daily., Disp: 90 tablet, Rfl: 3 .  meclizine (ANTIVERT) 25 MG tablet, TAKE 1 TABLET BY MOUTH THREE TIMES A DAY AS NEEDED, Disp: 90 tablet, Rfl: 1 .  metFORMIN (GLUCOPHAGE-XR) 500 MG 24 hr tablet, TAKE 1 TABLET BY MOUTH EVERY DAY WITH BREAKFAST, Disp: 90 tablet, Rfl: 1 .  montelukast (SINGULAIR) 10 MG tablet, Take 1 tablet (10 mg total) by mouth at bedtime., Disp: 90 tablet, Rfl: 3 .  pantoprazole (PROTONIX) 40 MG tablet, TAKE 1 TABLET BY MOUTH EVERY DAY, Disp: 90 tablet, Rfl: 1 .  phenol (CHLORASEPTIC) 1.4 % LIQD, Use as directed 2 sprays in the mouth or throat as needed for throat irritation / pain., Disp: 29 mL, Rfl: 0 .  PROAIR HFA 108 (90 Base) MCG/ACT inhaler, TAKE 2 PUFFS BY MOUTH  EVERY 6 HOURS AS NEEDED FOR WHEEZE OR SHORTNESS OF BREATH, Disp: 8.5 Inhaler, Rfl: 2 .  RESTASIS 0.05 % ophthalmic emulsion, PLACE 1 DROP INTO BOTH EYES 2 (TWO) TIMES DAILY. USE ONCE DAILY (Patient taking differently: Place 1 drop into both eyes 2 (two) times daily. ), Disp: 60 mL, Rfl: 0 .  rosuvastatin (CRESTOR) 20 MG tablet, Take 1 tablet (20 mg total) by mouth daily., Disp: 90 tablet, Rfl: 3 .  warfarin (COUMADIN) 4 MG tablet, Take 1/2 tablet on Sundays, Wednesdays and Saturdays. All other days, take one (1) tablet., Disp: 72 tablet, Rfl: 1 .  carvedilol (COREG) 12.5  MG tablet, Take 1 tablet (12.5 mg total) by mouth 2 (two) times daily., Disp: 180 tablet, Rfl: 2 Past Medical History:  Diagnosis Date  . Arthritis   . Asthma   . Bronchitis   . CHF (congestive heart failure) (HCC)   . Diabetes (HCC) 2019  . DVT, lower extremity, recurrent (HCC)    Patient had unprovoked PE on 2002 and DVT in right lower extremety 2008.  [name] GERD (gastroesophageal reflux disease)   . History of kidney stones   . Hypertension   . PE (pulmonary embolism)    Patient had unprovoked PE on 2002  . PONV (postoperative nausea and vomiting)   . Vertigo    Social History   Socioeconomic History  . Marital status: Divorced    Spouse name: Not on file  . Number of children: 5  . Years of education: 9 th grade  . Highest education level: 9th grade  Occupational History  . Occupation: Merchandiser, retail  . Financial resource strain: Somewhat hard  . Food insecurity    Worry: Never true    Inability: Sometimes true  . Transportation needs    Medical: No    Non-medical: Yes  Tobacco Use  . Smoking status: Former Smoker    Types: Cigarettes    Quit date: 09/18/1988    Years since quitting: 30.0  . Smokeless tobacco: Never Used  . Tobacco comment: 6 pack year smoking history as a teen  Substance and Sexual Activity  . Alcohol use: No  . Drug use: No  . Sexual activity: Never    Birth control/protection: None  Lifestyle  . Physical activity    Days per week: 2 days    Minutes per session: 20 min  . Stress: To some extent  Relationships  . Social connections    Talks on phone: More than three times a week    Gets together: Once a week    Attends religious service: More than 4 times per year    Active member of club or organization: No    Attends meetings of clubs or organizations: Never    Relationship status: Divorced  Other Topics Concern  . Not on file  Social History Narrative   Current Social History 05/17/2017        Patient lives with 29 yo  granddaughter" in a/an home / condo / townhome which is 1 story/stories. There are not steps up to the entrance the patient uses.       Patient's method of transportation is church member.      The highest level of education was some high school.      The patient currently retired.      Identified important Relationships are God, family       Pets : 1 lab/pitt mix named Engineer, materials /  Fun: Church,TV       Current Stressors: "Me and my granddaughter"       Religious / Personal Beliefs: I'm Holiness and I love people"       Other: "I'd give away my last dime."    Family History  Problem Relation Age of Onset  . Cancer Mother   . Hypertension Sister   . Diabetes Sister   . Hypertension Brother   . Diabetes Brother   . Hypertension Sister   . Diabetes Sister   . Colon cancer Other   . Heart Problems Other 34       sister's child, open heart surgery  . CVA Father   . Diabetes Son   . Hypertension Son   . Kidney disease Son        on dialysis  . Hypertension Sister   . Diabetes Sister   . Hypertension Sister   . Diabetes Sister   . Cancer Brother   . Hypertension Son   . Diabetes Son   . Hypertension Daughter   . Schizophrenia Daughter     ASSESSMENT Recent Results: The most recent result is correlated with 24 mg per week: Lab Results  Component Value Date   INR 2.9 10/15/2018   INR 1.7 (A) 09/24/2018   INR 2.2 08/27/2018    Anticoagulation Dosing: Description   Take one (1) tablet daily--except on Sundays, Wednesdays and Saturddays, take half (1/2)  tablet on these days.      INR today: Therapeutic  PLAN Weekly dose was decreased by 8% to 22 mg per week  Patient Instructions  Patient instructed to take medications as defined in the Anti-coagulation Track section of this encounter.  Patient instructed to take today's dose.  Patient instructed to take one (1) tablet daily--except on Sundays, Wednesdays and Saturddays, take half (1/2)  tablet  on these days.  Patient verbalized understanding of these instructions.    Patient advised to contact clinic or seek medical attention if signs/symptoms of bleeding or thromboembolism occur.  Patient verbalized understanding by repeating back information and was advised to contact me if further medication-related questions arise. Patient was also provided an information handout.  Follow-up Return in 4 weeks (on 11/12/2018) for Follow up INR.  Pennie Banter, PharmD, CPP  15 minutes spent face-to-face with the patient during the encounter. 50% of time spent on education, including signs/sx bleeding and clotting, as well as food and drug interactions with warfarin. 50% of time was spent on fingerprick POC INR sample collection,processing, results determination, and documentation in [email or web address].kim.net/.

## 2018-11-12 ENCOUNTER — Ambulatory Visit (INDEPENDENT_AMBULATORY_CARE_PROVIDER_SITE_OTHER): Payer: Medicare Other | Admitting: Pharmacist

## 2018-11-12 ENCOUNTER — Other Ambulatory Visit: Payer: Self-pay

## 2018-11-12 DIAGNOSIS — Z7901 Long term (current) use of anticoagulants: Secondary | ICD-10-CM

## 2018-11-12 DIAGNOSIS — Z5181 Encounter for therapeutic drug level monitoring: Secondary | ICD-10-CM | POA: Diagnosis not present

## 2018-11-12 DIAGNOSIS — Z86718 Personal history of other venous thrombosis and embolism: Secondary | ICD-10-CM

## 2018-11-12 DIAGNOSIS — I82409 Acute embolism and thrombosis of unspecified deep veins of unspecified lower extremity: Secondary | ICD-10-CM

## 2018-11-12 LAB — POCT INR: INR: 2.6 (ref 2.0–3.0)

## 2018-11-12 NOTE — Progress Notes (Signed)
Anticoagulation Management Caroline [name] is a 73 y.o. female who reports to the clinic for monitoring of warfarin treatment.    Indication:Significant history of DVT  Duration: indefinite Supervising physician: [facility] Visit History: Patient does not report signs/symptoms of bleeding or thromboembolism  Other recent changes: no diet, medications, lifestyle changes endorsed in this visit.   Anticoagulation Episode Summary    Current INR goal:  2.0-3.0  TTR:  79.9 % (3.8 y)  Next INR check:  12/03/2018  INR from last check:  2.6 (11/12/2018)  Weekly max warfarin dose:    Target end date:    INR check location:  Anticoagulation Clinic  Preferred lab:    Send INR reminders to:  [facility]   Indications   DVT lower extremity recurrent ([place]) [I82.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 Penicillins Anaphylaxis, Hives, Swelling and Rash    Has patient had a PCN reaction causing immediate rash, facial/tongue/throat swelling, SOB or lightheadedness with hypotension: Yes Has patient had a PCN reaction causing severe rash involving mucus membranes or skin necrosis: Yes Has patient had a PCN reaction that required hospitalization Yes Has patient had a PCN reaction occurring within the last 10 years: No If all of the above answers are "NO", then may proceed with Cephalosporin use.   . Cabbage Itching  . Keflex [Cephalexin] Hives    And feeling of throat tightness  . Shellfish Allergy Swelling  . Tomato Swelling  . Latex Itching and Rash    Current Outpatient Medications:  .  budesonide-formoterol (SYMBICORT) 160-4.5 MCG/ACT inhaler, Inhale 2 puffs into the lungs 2 (two) times daily., Disp: 3 Inhaler, Rfl: 4 .  calcium citrate-vitamin D (CITRACAL+D) 315-200 MG-UNIT tablet, TAKE 2 TABLETS DAILY BY MOUTH., Disp: 160 tablet, Rfl: 2 .  EPINEPHrine 0.3 mg/0.3 mL IJ SOAJ  injection, Inject 0.3 mg into the muscle as needed. Severe allergic reactions, Disp: , Rfl: 0 .  fluticasone (FLONASE) 50 MCG/ACT nasal spray, PLACE 2 SPRAYS DAILY INTO BOTH NOSTRILS., Disp: 16 g, Rfl: 1 .  furosemide (LASIX) 40 MG tablet, Take 1 tablet (40 mg total) by mouth every other day., Disp: 90 tablet, Rfl: 1 .  hydrocortisone (PROCTOSOL HC) 2.5 % rectal cream, Place 1 application rectally 2 (two) times daily., Disp: 30 g, Rfl: 0 .  losartan (COZAAR) 100 MG tablet, Take 1 tablet (100 mg total) by mouth daily., Disp: 90 tablet, Rfl: 2 .  magnesium oxide (MAG-OX) 400 MG tablet, Take 1 tablet (400 mg total) by mouth 2 (two) times daily., Disp: 90 tablet, Rfl: 3 .  meclizine (ANTIVERT) 25 MG tablet, TAKE 1 TABLET BY MOUTH THREE TIMES A DAY AS NEEDED, Disp: 90 tablet, Rfl: 1 .  metFORMIN (GLUCOPHAGE-XR) 500 MG 24 hr tablet, TAKE 1 TABLET BY MOUTH EVERY DAY WITH BREAKFAST, Disp: 90 tablet, Rfl: 1 .  montelukast (SINGULAIR) 10 MG tablet, Take 1 tablet (10 mg total) by mouth at bedtime., Disp: 90 tablet, Rfl: 3 .  pantoprazole (PROTONIX) 40 MG tablet, TAKE 1 TABLET BY MOUTH EVERY DAY, Disp: 90 tablet, Rfl: 1 .  phenol (CHLORASEPTIC) 1.4 % LIQD, Use as directed 2 sprays in the mouth or throat as needed for throat irritation / pain., Disp: 29 mL, Rfl: 0 .  PROAIR HFA 108 (90 Base) MCG/ACT inhaler, TAKE 2 PUFFS BY MOUTH EVERY 6 HOURS AS NEEDED FOR  WHEEZE OR SHORTNESS OF BREATH, Disp: 8.5 Inhaler, Rfl: 2 .  RESTASIS 0.05 % ophthalmic emulsion, PLACE 1 DROP INTO BOTH EYES 2 (TWO) TIMES DAILY. USE ONCE DAILY (Patient taking differently: Place 1 drop into both eyes 2 (two) times daily. ), Disp: 60 mL, Rfl: 0 .  rosuvastatin (CRESTOR) 20 MG tablet, Take 1 tablet (20 mg total) by mouth daily., Disp: 90 tablet, Rfl: 3 .  warfarin (COUMADIN) 4 MG tablet, Take 1/2 tablet on Sundays, Wednesdays and Saturdays. All other days, take one (1) tablet., Disp: 72 tablet, Rfl: 1 .  carvedilol (COREG) 12.5 MG tablet, Take 1  tablet (12.5 mg total) by mouth 2 (two) times daily., Disp: 180 tablet, Rfl: 2 Past Medical History:  Diagnosis Date  . Arthritis   . Asthma   . Bronchitis   . CHF (congestive heart failure) (HCC)   . Diabetes (H[facility]) 2019  . DVT, lower extremity, recurrent (H[facility])    Patient had unprovoked PE on 2002 and DVT in right lower extremety 2008.  [name] GERD (gastroesophageal reflux disease)   . History of kidney stones   . Hypertension   . PE (pulmonary embolism)    Patient had unprovoked PE on 2002  . PONV (postoperative nausea and vomiting)   . Vertigo    Social History   Socioeconomic History  . Marital status: Divorced    Spouse name: Not on file  . Number of children: 5  . Years of education: 9 th grade  . Highest education level: 9th grade  Occupational History  . Occupation: Merchandiser, retail  . Financial resource strain: Somewhat hard  . Food insecurity    Worry: Never true    Inability: Sometimes true  . Transportation needs    Medical: No    Non-medical: Yes  Tobacco Use  . Smoking status: Former Smoker    Types: Cigarettes    Quit date: 09/18/1988    Years since quitting: 30.1  . Smokeless tobacco: Never Used  . Tobacco comment: 6 pack year smoking history as a teen  Substance and Sexual Activity  . Alcohol use: No  . Drug use: No  . Sexual activity: Never    Birth control/protection: None  Lifestyle  . Physical activity    Days per week: 2 days    Minutes per session: 20 min  . Stress: To some extent  Relationships  . Social connections    Talks on phone: More than three times a week    Gets together: Once a week    Attends religious service: More than 4 times per year    Active member of club or organization: No    Attends meetings of clubs or organizations: Never    Relationship status: Divorced  Other Topics Concern  . Not on file  Social History Narrative   Current Social History 05/17/2017        Patient lives with 26 yo granddaughter" in a/an home  / condo / townhome which is 1 story/stories. There are not steps up to the entrance the patient uses.       Patient's method of transportation is church member.      The highest level of education was some high school.      The patient currently retired.      Identified important Relationships are God, family       Pets : 1 lab/pitt mix named Cinnamon       Interests / Fun: Church,TV  Current Stressors: "Me and my granddaughter"       Religious / Personal Beliefs: I'm Holiness and I love people"       Other: "I'd give away my last dime."    Family History  Problem Relation Age of Onset  . Cancer Mother   . Hypertension Sister   . Diabetes Sister   . Hypertension Brother   . Diabetes Brother   . Hypertension Sister   . Diabetes Sister   . Colon cancer Other   . Heart Problems Other 34       sister's child, open heart surgery  . CVA Father   . Diabetes Son   . Hypertension Son   . Kidney disease Son        on dialysis  . Hypertension Sister   . Diabetes Sister   . Hypertension Sister   . Diabetes Sister   . Cancer Brother   . Hypertension Son   . Diabetes Son   . Hypertension Daughter   . Schizophrenia Daughter     ASSESSMENT Recent Results: The most recent result is correlated with 22 mg per week: Lab Results  Component Value Date   INR 2.6 11/12/2018   INR 2.9 10/15/2018   INR 1.7 (A) 09/24/2018    Anticoagulation Dosing: Description   Take one (1) tablet daily--except on Sundays, Wednesdays and Saturddays, take half (1/2)  tablet on these days.      INR today: Therapeutic  PLAN Weekly dose was unchanged   Patient Instructions  Patient instructed to take medications as defined in the Anti-coagulation Track section of this encounter.  Patient instructed to take today's dose.  Patient instructed to take one (1) tablet daily--except on Sundays, Wednesdays and Saturddays, take half (1/2)  tablet on these days. Patient verbalized understanding  of these instructions.    Patient advised to contact clinic or seek medical attention if signs/symptoms of bleeding or thromboembolism occur.  Patient verbalized understanding by repeating back information and was advised to contact me if further medication-related questions arise. Patient was also provided an information handout.  Follow-up Return in 3 weeks (On 12/03/2018) for Follow-up INR.   Charlett Nose, PharmD  PGY1 Acute Care Pharmacy Resident [phone]  15 minutes spent face-to-face with the patient during the encounter. 50% of time spent on education, including signs/sx bleeding and clotting, as well as food and drug interactions with warfarin. 50% of time was spent on fingerprick POC INR sample collection,processing, results determination, and documentation in [email or web address].

## 2018-11-12 NOTE — Patient Instructions (Signed)
Patient instructed to take medications as defined in the Anti-coagulation Track section of this encounter.  Patient instructed to take today's dose.  Patient instructed to take one (1) tablet daily--except on Sundays, Wednesdays and Saturddays, take half (1/2)  tablet on these days. Patient verbalized understanding of these instructions.

## 2018-11-20 ENCOUNTER — Other Ambulatory Visit: Payer: Self-pay | Admitting: Internal Medicine

## 2018-11-20 DIAGNOSIS — Z1231 Encounter for screening mammogram for malignant neoplasm of breast: Secondary | ICD-10-CM

## 2018-11-21 ENCOUNTER — Other Ambulatory Visit: Payer: Self-pay | Admitting: Internal Medicine

## 2018-11-21 DIAGNOSIS — R42 Dizziness and giddiness: Secondary | ICD-10-CM

## 2018-12-03 ENCOUNTER — Ambulatory Visit (INDEPENDENT_AMBULATORY_CARE_PROVIDER_SITE_OTHER): Payer: Medicare Other | Admitting: Pharmacist

## 2018-12-03 ENCOUNTER — Other Ambulatory Visit: Payer: Self-pay

## 2018-12-03 DIAGNOSIS — I82409 Acute embolism and thrombosis of unspecified deep veins of unspecified lower extremity: Secondary | ICD-10-CM

## 2018-12-03 DIAGNOSIS — Z5181 Encounter for therapeutic drug level monitoring: Secondary | ICD-10-CM

## 2018-12-03 DIAGNOSIS — Z86711 Personal history of pulmonary embolism: Secondary | ICD-10-CM

## 2018-12-03 DIAGNOSIS — Z7901 Long term (current) use of anticoagulants: Secondary | ICD-10-CM

## 2018-12-03 LAB — POCT INR: INR: 2.2 (ref 2.0–3.0)

## 2018-12-03 NOTE — Progress Notes (Signed)
Anticoagulation Management Caroline [name] is a 73 y.o. female who reports to the clinic for monitoring of warfarin treatment.    Indication: DVT, History of; Long term current use of anticoagulant.   Duration: indefinite Supervising physician: Jessy Oto, MD, PhD  Anticoagulation Clinic Visit History: Patient does not report signs/symptoms of bleeding or thromboembolism  Other recent changes: No diet, medications, lifestyle changes.  Anticoagulation Episode Summary    Current INR goal:  2.0-3.0  TTR:  80.2 % (3.8 y)  Next INR check:  12/31/2018  INR from last check:  2.2 (12/03/2018)  Weekly max warfarin dose:    Target end date:    INR check location:  Anticoagulation Clinic  Preferred lab:    Send INR reminders to:  [facility]   Indications   DVT lower extremity recurrent ([facility]) [I82.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 Penicillins Anaphylaxis, Hives, Swelling and Rash    Has patient had a PCN reaction causing immediate rash, facial/tongue/throat swelling, SOB or lightheadedness with hypotension: Yes Has patient had a PCN reaction causing severe rash involving mucus membranes or skin necrosis: Yes Has patient had a PCN reaction that required hospitalization Yes Has patient had a PCN reaction occurring within the last 10 years: No If all of the above answers are "NO", then may proceed with Cephalosporin use.   . Cabbage Itching  . Keflex [Cephalexin] Hives    And feeling of throat tightness  . Shellfish Allergy Swelling  . Tomato Swelling  . Latex Itching and Rash    Current Outpatient Medications:  .  budesonide-formoterol (SYMBICORT) 160-4.5 MCG/ACT inhaler, Inhale 2 puffs into the lungs 2 (two) times daily., Disp: 3 Inhaler, Rfl: 4 .  calcium citrate-vitamin D (CITRACAL+D) 315-200 MG-UNIT tablet, TAKE 2 TABLETS DAILY BY MOUTH., Disp: 160 tablet, Rfl: 2 .  EPINEPHrine 0.3 mg/0.3 mL  IJ SOAJ injection, Inject 0.3 mg into the muscle as needed. Severe allergic reactions, Disp: , Rfl: 0 .  fluticasone (FLONASE) 50 MCG/ACT nasal spray, PLACE 2 SPRAYS DAILY INTO BOTH NOSTRILS., Disp: 16 g, Rfl: 1 .  furosemide (LASIX) 40 MG tablet, Take 1 tablet (40 mg total) by mouth every other day., Disp: 90 tablet, Rfl: 1 .  hydrocortisone (PROCTOSOL HC) 2.5 % rectal cream, Place 1 application rectally 2 (two) times daily., Disp: 30 g, Rfl: 0 .  losartan (COZAAR) 100 MG tablet, Take 1 tablet (100 mg total) by mouth daily., Disp: 90 tablet, Rfl: 2 .  magnesium oxide (MAG-OX) 400 MG tablet, Take 1 tablet (400 mg total) by mouth 2 (two) times daily., Disp: 90 tablet, Rfl: 3 .  meclizine (ANTIVERT) 25 MG tablet, TAKE 1 TABLET BY MOUTH THREE TIMES A DAY AS NEEDED, Disp: 90 tablet, Rfl: 1 .  metFORMIN (GLUCOPHAGE-XR) 500 MG 24 hr tablet, TAKE 1 TABLET BY MOUTH EVERY DAY WITH BREAKFAST, Disp: 90 tablet, Rfl: 1 .  montelukast (SINGULAIR) 10 MG tablet, Take 1 tablet (10 mg total) by mouth at bedtime., Disp: 90 tablet, Rfl: 3 .  pantoprazole (PROTONIX) 40 MG tablet, TAKE 1 TABLET BY MOUTH EVERY DAY, Disp: 90 tablet, Rfl: 1 .  phenol (CHLORASEPTIC) 1.4 % LIQD, Use as directed 2 sprays in the mouth or throat as needed for throat irritation / pain., Disp: 29 mL, Rfl: 0 .  PROAIR HFA 108 (90 Base) MCG/ACT inhaler, TAKE 2 PUFFS BY MOUTH EVERY 6  HOURS AS NEEDED FOR WHEEZE OR SHORTNESS OF BREATH, Disp: 8.5 Inhaler, Rfl: 2 .  RESTASIS 0.05 % ophthalmic emulsion, PLACE 1 DROP INTO BOTH EYES 2 (TWO) TIMES DAILY. USE ONCE DAILY (Patient taking differently: Place 1 drop into both eyes 2 (two) times daily. ), Disp: 60 mL, Rfl: 0 .  rosuvastatin (CRESTOR) 20 MG tablet, Take 1 tablet (20 mg total) by mouth daily., Disp: 90 tablet, Rfl: 3 .  warfarin (COUMADIN) 4 MG tablet, Take 1/2 tablet on Sundays, Wednesdays and Saturdays. All other days, take one (1) tablet., Disp: 72 tablet, Rfl: 1 .  carvedilol (COREG) 12.5 MG  tablet, Take 1 tablet (12.5 mg total) by mouth 2 (two) times daily., Disp: 180 tablet, Rfl: 2 Past Medical History:  Diagnosis Date  . Arthritis   . Asthma   . Bronchitis   . CHF (congestive heart failure) ([employer])   . Diabetes ([place]) 2019  . DVT, lower extremity, recurrent ([place])    Patient had unprovoked PE on 2002 and DVT in right lower extremety 2008.  [name] GERD (gastroesophageal reflux disease)   . History of kidney stones   . Hypertension   . PE (pulmonary embolism)    Patient had unprovoked PE on 2002  . PONV (postoperative nausea and vomiting)   . Vertigo    Social History   Socioeconomic History  . Marital status: Divorced    Spouse name: Not on file  . Number of children: 5  . Years of education: 9 th grade  . Highest education level: 9th grade  Occupational History  . Occupation: Lexicographer  . Financial resource strain: Somewhat hard  . Food insecurity    Worry: Never true    Inability: Sometimes true  . Transportation needs    Medical: No    Non-medical: Yes  Tobacco Use  . Smoking status: Former Smoker    Types: Cigarettes    Quit date: 09/18/1988    Years since quitting: 30.2  . Smokeless tobacco: Never Used  . Tobacco comment: 6 pack year smoking history as a teen  Substance and Sexual Activity  . Alcohol use: No  . Drug use: No  . Sexual activity: Never    Birth control/protection: None  Lifestyle  . Physical activity    Days per week: 2 days    Minutes per session: 20 min  . Stress: To some extent  Relationships  . Social connections    Talks on phone: More than three times a week    Gets together: Once a week    Attends religious service: More than 4 times per year    Active member of club or organization: No    Attends meetings of clubs or organizations: Never    Relationship status: Divorced  Other Topics Concern  . Not on file  Social History Narrative   Current Social History 05/17/2017        Patient lives with 13 yo  granddaughter" in a/an home / condo / townhome which is 1 story/stories. There are not steps up to the entrance the patient uses.       Patient's method of transportation is church member.      The highest level of education was some high school.      The patient currently retired.      Identified important Relationships are God, family       Pets : 1 lab/pitt mix named Cinnamon       Interests / Fun:  Church,TV       Current Stressors: "Me and my granddaughter"       Religious / Personal Beliefs: I'm Holiness and I love people"       Other: "I'd give away my last dime."    Family History  Problem Relation Age of Onset  . Cancer Mother   . Hypertension Sister   . Diabetes Sister   . Hypertension Brother   . Diabetes Brother   . Hypertension Sister   . Diabetes Sister   . Colon cancer Other   . Heart Problems Other 34       sister's child, open heart surgery  . CVA Father   . Diabetes Son   . Hypertension Son   . Kidney disease Son        on dialysis  . Hypertension Sister   . Diabetes Sister   . Hypertension Sister   . Diabetes Sister   . Cancer Brother   . Hypertension Son   . Diabetes Son   . Hypertension Daughter   . Schizophrenia Daughter     ASSESSMENT Recent Results: The most recent result is correlated with 22 mg per week: Lab Results  Component Value Date   INR 2.2 12/03/2018   INR 2.6 11/12/2018   INR 2.9 10/15/2018    Anticoagulation Dosing: Description   Take one (1) tablet daily--except on Sundays, and Saturdays, take one-half (1/2)  tablet on these days.      INR today: Therapeutic  PLAN Weekly dose was increased by 9% to 24 mg per week  Patient Instructions  Patient instructed to take medications as defined in the Anti-coagulation Track section of this encounter.  Patient instructed to take today's dose.  Patient instructed to take one (1) tablet daily--except on Sundays, and Saturdays, take one-half (1/2)  tablet on these days.   Patient verbalized understanding of these instructions.    Patient advised to contact clinic or seek medical attention if signs/symptoms of bleeding or thromboembolism occur.  Patient verbalized understanding by repeating back information and was advised to contact me if further medication-related questions arise. Patient was also provided an information handout.  Follow-up Return in 4 weeks (on 12/31/2018) for Follow up INR.  Elicia Lamp, PharmD, CPP  15 minutes spent face-to-face with the patient during the encounter. 50% of time spent on education, including signs/sx bleeding and clotting, as well as food and drug interactions with warfarin. 50% of time was spent on fingerprick POC INR sample collection,processing, results determination, and documentation in [email or web address].

## 2018-12-03 NOTE — Progress Notes (Signed)
INTERNAL MEDICINE TEACHING ATTENDING ADDENDUM  I agree with pharmacy recommendations as outlined in their note.   Alexander N Raines, MD  

## 2018-12-03 NOTE — Patient Instructions (Signed)
Patient instructed to take medications as defined in the Anti-coagulation Track section of this encounter.  Patient instructed to take today's dose.  Patient instructed to take one (1) tablet daily--except on Sundays, and Saturdays, take one-half (1/2)  tablet on these days.  Patient verbalized understanding of these instructions.

## 2018-12-07 ENCOUNTER — Telehealth: Payer: Self-pay | Admitting: *Deleted

## 2018-12-07 ENCOUNTER — Other Ambulatory Visit: Payer: Self-pay

## 2018-12-07 ENCOUNTER — Emergency Department (HOSPITAL_COMMUNITY): Payer: Medicare Other

## 2018-12-07 ENCOUNTER — Encounter (HOSPITAL_COMMUNITY): Payer: Self-pay

## 2018-12-07 ENCOUNTER — Emergency Department (HOSPITAL_COMMUNITY)
Admission: EM | Admit: 2018-12-07 | Discharge: 2018-12-07 | Disposition: A | Discharge status: Home / Self Care | Payer: Medicare Other | Attending: Emergency Medicine | Admitting: Emergency Medicine

## 2018-12-07 DIAGNOSIS — Z7901 Long term (current) use of anticoagulants: Secondary | ICD-10-CM | POA: Diagnosis not present

## 2018-12-07 DIAGNOSIS — R1084 Generalized abdominal pain: Secondary | ICD-10-CM | POA: Diagnosis not present

## 2018-12-07 DIAGNOSIS — I110 Hypertensive heart disease with heart failure: Secondary | ICD-10-CM | POA: Insufficient documentation

## 2018-12-07 DIAGNOSIS — Z91040 Latex allergy status: Secondary | ICD-10-CM | POA: Diagnosis not present

## 2018-12-07 DIAGNOSIS — R110 Nausea: Secondary | ICD-10-CM | POA: Diagnosis not present

## 2018-12-07 DIAGNOSIS — Z79899 Other long term (current) drug therapy: Secondary | ICD-10-CM | POA: Insufficient documentation

## 2018-12-07 DIAGNOSIS — Z87891 Personal history of nicotine dependence: Secondary | ICD-10-CM | POA: Insufficient documentation

## 2018-12-07 DIAGNOSIS — K5732 Diverticulitis of large intestine without perforation or abscess without bleeding: Secondary | ICD-10-CM | POA: Insufficient documentation

## 2018-12-07 DIAGNOSIS — N200 Calculus of kidney: Secondary | ICD-10-CM | POA: Diagnosis not present

## 2018-12-07 DIAGNOSIS — E119 Type 2 diabetes mellitus without complications: Secondary | ICD-10-CM | POA: Diagnosis not present

## 2018-12-07 DIAGNOSIS — I509 Heart failure, unspecified: Secondary | ICD-10-CM | POA: Insufficient documentation

## 2018-12-07 DIAGNOSIS — Z7984 Long term (current) use of oral hypoglycemic drugs: Secondary | ICD-10-CM | POA: Diagnosis not present

## 2018-12-07 DIAGNOSIS — R1111 Vomiting without nausea: Secondary | ICD-10-CM | POA: Diagnosis not present

## 2018-12-07 DIAGNOSIS — R1032 Left lower quadrant pain: Secondary | ICD-10-CM | POA: Diagnosis present

## 2018-12-07 DIAGNOSIS — I10 Essential (primary) hypertension: Secondary | ICD-10-CM | POA: Diagnosis not present

## 2018-12-07 DIAGNOSIS — Z743 Need for continuous supervision: Secondary | ICD-10-CM | POA: Diagnosis not present

## 2018-12-07 LAB — BASIC METABOLIC PANEL
Anion gap: 8 (ref 5–15)
BUN: 11 mg/dL (ref 8–23)
CO2: 26 mmol/L (ref 22–32)
Calcium: 8.6 mg/dL — ABNORMAL LOW (ref 8.9–10.3)
Chloride: 106 mmol/L (ref 98–111)
Creatinine, Ser: 0.99 mg/dL (ref 0.44–1.00)
GFR calc Af Amer: >60 mL/min (ref >60)
GFR calc non Af Amer: 57 mL/min — ABNORMAL LOW (ref >60)
Glucose, Bld: 201 mg/dL — ABNORMAL HIGH (ref 70–99)
Potassium: 3.5 mmol/L (ref 3.5–5.1)
Sodium: 140 mmol/L (ref 135–145)

## 2018-12-07 LAB — CBC WITH DIFFERENTIAL/PLATELET
Abs Immature Granulocytes: 0.03 10*3/uL (ref 0.00–0.07)
Basophils Absolute: 0 10*3/uL (ref 0.0–0.1)
Basophils Relative: 0 %
Eosinophils Absolute: 0.1 10*3/uL (ref 0.0–0.5)
Eosinophils Relative: 1 %
HCT: 36.7 % (ref 36.0–46.0)
Hemoglobin: 10.7 g/dL — ABNORMAL LOW (ref 12.0–15.0)
Immature Granulocytes: 0 %
Lymphocytes Relative: 12 %
Lymphs Abs: 1.4 10*3/uL (ref 0.7–4.0)
MCH: 24.5 pg — ABNORMAL LOW (ref 26.0–34.0)
MCHC: 29.2 g/dL — ABNORMAL LOW (ref 30.0–36.0)
MCV: 84 fL (ref 80.0–100.0)
Monocytes Absolute: 0.8 10*3/uL (ref 0.1–1.0)
Monocytes Relative: 7 %
Neutro Abs: 8.8 10*3/uL — ABNORMAL HIGH (ref 1.7–7.7)
Neutrophils Relative %: 80 %
Platelets: 201 10*3/uL (ref 150–400)
RBC: 4.37 MIL/uL (ref 3.87–5.11)
RDW: 14.7 % (ref 11.5–15.5)
WBC: 11.1 10*3/uL — ABNORMAL HIGH (ref 4.0–10.5)
nRBC: 0 % (ref 0.0–0.2)

## 2018-12-07 LAB — PROTIME-INR
INR: 2.7 — ABNORMAL HIGH (ref 0.8–1.2)
Prothrombin Time: 28.7 seconds — ABNORMAL HIGH (ref 11.4–15.2)

## 2018-12-07 MED ORDER — METRONIDAZOLE 500 MG PO TABS: 500.0000 mg | ORAL_TABLET | Freq: Three times a day (TID) | ORAL | 0 refills | Status: DC

## 2018-12-07 MED ORDER — FENTANYL CITRATE (PF) 100 MCG/2ML IJ SOLN
50.0000 ug | Freq: Once | INTRAMUSCULAR | Status: AC
  Administered 2018-12-07: 02:00:00 50 ug via INTRAVENOUS
  Filled 2018-12-07: qty 2

## 2018-12-07 MED ORDER — CIPROFLOXACIN HCL 500 MG PO TABS
500.0000 mg | ORAL_TABLET | Freq: Once | ORAL | Status: AC
  Administered 2018-12-07: 500 mg via ORAL
  Filled 2018-12-07: qty 1

## 2018-12-07 MED ORDER — ONDANSETRON HCL 4 MG/2ML IJ SOLN
4.0000 mg | Freq: Once | INTRAMUSCULAR | Status: AC
  Administered 2018-12-07: 02:00:00 4 mg via INTRAVENOUS
  Filled 2018-12-07: qty 2

## 2018-12-07 MED ORDER — CIPROFLOXACIN HCL 500 MG PO TABS: 500.0000 mg | ORAL_TABLET | Freq: Two times a day (BID) | ORAL | 0 refills | Status: DC

## 2018-12-07 MED ORDER — IOHEXOL 300 MG/ML  SOLN
100.0000 mL | Freq: Once | INTRAMUSCULAR | Status: AC | PRN
  Administered 2018-12-07: 100 mL via INTRAVENOUS

## 2018-12-07 MED ORDER — SODIUM CHLORIDE (PF) 0.9 % IJ SOLN
INTRAMUSCULAR | Status: AC
  Administered 2018-12-07: 03:00:00
  Filled 2018-12-07: qty 50

## 2018-12-07 MED ORDER — METRONIDAZOLE IN NACL 5-0.79 MG/ML-% IV SOLN
500.0000 mg | Freq: Once | INTRAVENOUS | Status: AC
  Administered 2018-12-07: 500 mg via INTRAVENOUS
  Filled 2018-12-07: qty 100

## 2018-12-07 MED ORDER — HYDROCODONE-ACETAMINOPHEN 5-325 MG PO TABS: 1.0000 | ORAL_TABLET | Freq: Four times a day (QID) | ORAL | 0 refills | Status: DC | PRN

## 2018-12-07 NOTE — ED Notes (Signed)
Patient taken to CT.
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
## 2018-12-07 NOTE — ED Provider Notes (Signed)
Signout from Dr. Florina Ou.  73 year old female here with lower abdominal pain found to have diverticulitis. Physical Exam  BP 126/66   Pulse (!) 104   Temp 98.9 F (37.2 C) (Oral)   Resp 20   Ht 5\' 2"  (1.575 m)   Wt 122.5 kg   SpO2 90%   BMI 49.38 kg/m   Physical Exam  ED Course/Procedures     Procedures  MDM  Plan is for outpatient management of her diverticulitis.  A social work consult has been placed to assess the patient's home situation and make sure she is safe for discharge.       Hayden Rasmussen, MD 12/07/18 [phone]

## 2018-12-07 NOTE — Telephone Encounter (Signed)
TOC CM received call from [facility] pharmacist, Alex [phone] that medications prescribed from ED are interacting with other meds. Message sent to EDP, Dr Melina Copa to follow up with [facility]. Jonnie Finner RN [place], [place] ED TOC CM [phone]

## 2018-12-07 NOTE — ED Triage Notes (Signed)
Arrived by [facility] from home CC; LLQ abdominal pain and vomiting X2 episodes. Hx diverticulitis; patient reports she had roast beef and thinks this may what caused her to become ill

## 2018-12-07 NOTE — ED Notes (Signed)
Meal given
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
## 2018-12-07 NOTE — ED Provider Notes (Signed)
[facility] DEPT Provider Note: Lowella Dell, MD, FACEP  CSN: [id number] MRN: [id number] ARRIVAL: 12/07/18 at 0114 ROOM: WA24/WA24   CHIEF COMPLAINT  Abdominal Pain   HISTORY OF PRESENT ILLNESS  12/07/18 1:32 AM Caroline [name] is a 73 y.o. female with a history of diverticulitis.  She is here with left suprapubic pain since yesterday morning.  She rates the pain is a 6 out of 10 and describes it as feeling like previous diverticulitis.  It is worse with movement and with voiding of her bowels or bladder.  She has had a low-grade fever and chills with this.  She has had nausea and vomiting and she estimates she has vomited 6 times.    Past Medical History:  Diagnosis Date  . Arthritis   . Asthma   . Bronchitis   . CHF (congestive heart failure) (HCC)   . Diabetes (HCC) 2019  . DVT, lower extremity, recurrent (HCC)    Patient had unprovoked PE on 2002 and DVT in right lower extremety 2008.  [name] GERD (gastroesophageal reflux disease)   . History of kidney stones   . Hypertension   . PE (pulmonary embolism)    Patient had unprovoked PE on 2002  . PONV (postoperative nausea and vomiting)   . Vertigo     Past Surgical History:  Procedure Laterality Date  . ABDOMINAL HYSTERECTOMY     1980  . CYSTOSCOPY W/ URETERAL STENT PLACEMENT Right 09/16/2015   Procedure: CYSTOSCOPY WITH RETROGRADE PYELOGRAM/URETERAL STENT PLACEMENT;  Surgeon: Alfredo Martinez, MD;  Location: [facility];  Service: Urology;  Laterality: Right;  . CYSTOSCOPY WITH RETROGRADE PYELOGRAM, URETEROSCOPY AND STENT PLACEMENT Right 12/11/2015   Procedure: RIGHT URETEROSCOPY/HOLMIUM LASER LITHOTRIPSY AND STONE REMOVAL removal and placement of double j stent;  Surgeon: Crist Fat, MD;  Location: [facility];  Service: Urology;  Laterality: Right;  . HOLMIUM LASER APPLICATION Right 12/11/2015   Procedure: HOLMIUM LASER APPLICATION;  Surgeon: Crist Fat, MD;  Location: [facility];  Service: Urology;  Laterality: Right;     Family History  Problem Relation Age of Onset  . Cancer Mother   . Hypertension Sister   . Diabetes Sister   . Hypertension Brother   . Diabetes Brother   . Hypertension Sister   . Diabetes Sister   . Colon cancer Other   . Heart Problems Other 34       sister's child, open heart surgery  . CVA Father   . Diabetes Son   . Hypertension Son   . Kidney disease Son        on dialysis  . Hypertension Sister   . Diabetes Sister   . Hypertension Sister   . Diabetes Sister   . Cancer Brother   . Hypertension Son   . Diabetes Son   . Hypertension Daughter   . Schizophrenia Daughter     Social History   Tobacco Use  . Smoking status: Former Smoker    Types: Cigarettes    Quit date: 09/18/1988    Years since quitting: 30.2  . Smokeless tobacco: Never Used  . Tobacco comment: 6 pack year smoking history as a teen  Substance Use Topics  . Alcohol use: No  . Drug use: No    Prior to Admission medications   Medication Sig Start Date End Date Taking? Authorizing Provider  budesonide-formoterol (SYMBICORT) 160-4.5 MCG/ACT inhaler Inhale 2 puffs into the lungs 2 (two) times daily. 10/08/18  Yes Earl Lagos, MD  calcium citrate-vitamin  D (CITRACAL+D) 315-200 MG-UNIT tablet TAKE 2 TABLETS DAILY BY MOUTH. Patient taking differently: Take 2 tablets by mouth daily.  03/27/18  Yes Earl LagosNarendra, Nischal, MD  carvedilol (COREG) 12.5 MG tablet Take 1 tablet (12.5 mg total) by mouth 2 (two) times daily. 03/05/18 12/07/18 Yes Camnitz, Will Daphine DeutscherMartin, MD  EPINEPHrine 0.3 mg/0.3 mL IJ SOAJ injection Inject 0.3 mg into the muscle as needed. Severe allergic reactions 10/30/17  Yes [provider]  fluticasone (FLONASE) 50 MCG/ACT nasal spray PLACE 2 SPRAYS DAILY INTO BOTH NOSTRILS. Patient taking differently: Place 2 sprays into both nostrils daily.  02/13/18  Yes Earl LagosNarendra, Nischal, MD  furosemide (LASIX) 40 MG tablet Take 1 tablet (40 mg total) by mouth every other day. 12/15/17  Yes  Earl LagosNarendra, Nischal, MD  hydrocortisone (PROCTOSOL HC) 2.5 % rectal cream Place 1 application rectally 2 (two) times daily. 10/26/17  Yes Earl LagosNarendra, Nischal, MD  losartan (COZAAR) 100 MG tablet Take 1 tablet (100 mg total) by mouth daily. 03/05/18  Yes Camnitz, Andree CossWill Martin, MD  magnesium oxide (MAG-OX) 400 MG tablet Take 1 tablet (400 mg total) by mouth 2 (two) times daily. 03/05/18  Yes Camnitz, Will Daphine DeutscherMartin, MD  meclizine (ANTIVERT) 25 MG tablet TAKE 1 TABLET BY MOUTH THREE TIMES A DAY AS NEEDED 11/22/18  Yes Earl LagosNarendra, Nischal, MD  metFORMIN (GLUCOPHAGE-XR) 500 MG 24 hr tablet TAKE 1 TABLET BY MOUTH EVERY DAY WITH BREAKFAST Patient taking differently: Take 500 mg by mouth daily with breakfast.  07/20/18  Yes Earl LagosNarendra, Nischal, MD  montelukast (SINGULAIR) 10 MG tablet Take 1 tablet (10 mg total) by mouth at bedtime. 08/29/17  Yes Molt, Bethany, DO  pantoprazole (PROTONIX) 40 MG tablet TAKE 1 TABLET BY MOUTH EVERY DAY Patient taking differently: Take 40 mg by mouth daily.  08/08/18  Yes Earl LagosNarendra, Nischal, MD  phenol (CHLORASEPTIC) 1.4 % LIQD Use as directed 2 sprays in the mouth or throat as needed for throat irritation / pain. 10/30/17  Yes Seawell, Jaimie A, DO  PROAIR HFA 108 (90 Base) MCG/ACT inhaler TAKE 2 PUFFS BY MOUTH EVERY 6 HOURS AS NEEDED FOR WHEEZE OR SHORTNESS OF BREATH Patient taking differently: Inhale 2 puffs into the lungs every 6 (six) hours as needed for wheezing or shortness of breath.  01/30/18  Yes Narendra, Nischal, MD  RESTASIS 0.05 % ophthalmic emulsion PLACE 1 DROP INTO BOTH EYES 2 (TWO) TIMES DAILY. USE ONCE DAILY Patient taking differently: Place 1 drop into both eyes 2 (two) times daily.  08/29/17  Yes Molt, Bethany, DO  rosuvastatin (CRESTOR) 20 MG tablet Take 1 tablet (20 mg total) by mouth daily. 01/30/18  Yes Earl LagosNarendra, Nischal, MD  warfarin (COUMADIN) 4 MG tablet Take 1/2 tablet on Sundays, Wednesdays and Saturdays. All other days, take one (1) tablet. 10/15/18  Yes Elicia LampGroce, James  B, RPH-CPP    Allergies Penicillins, Cabbage, Keflex [cephalexin], Shellfish allergy, Tomato, and Latex   REVIEW OF SYSTEMS  Negative except as noted here or in the History of Present Illness.   PHYSICAL EXAMINATION  Initial Vital Signs Blood pressure (!) 149/74, pulse 98, temperature 98.9 F (37.2 C), temperature source Oral, resp. rate 17, height 5\' 2"  (1.575 m), weight 122.5 kg, SpO2 95 %.  Examination General: Well-developed, well-nourished female in no acute distress; appearance consistent with age of record HENT: normocephalic; atraumatic Eyes: pupils equal, round and reactive to light; extraocular muscles intact; slight arcus senilis bilaterally Neck: supple Heart: regular rate and rhythm Lungs: clear to auscultation bilaterally Abdomen: soft; obese; left suprapubic  tenderness; bowel sounds present Extremities: No deformity; full range of motion; pulses normal Neurologic: Awake, alert and oriented; motor function intact in all extremities and symmetric; no facial droop Skin: Warm and dry Psychiatric: Normal mood and affect   RESULTS  Summary of this visit's results, reviewed and interpreted by myself:   EKG Interpretation  Date/Time:    Ventricular Rate:    PR Interval:    QRS Duration:   QT Interval:    QTC Calculation:   R Axis:     Text Interpretation:        Laboratory Studies: Results for orders placed or performed during the hospital encounter of 12/07/18 (from the past 24 hour(s))  PT/INR     Status: Abnormal   Collection Time: 12/07/18  1:39 AM  Result Value Ref Range   Prothrombin Time 28.7 (H) 11.4 - 15.2 seconds   INR 2.7 (H) 0.8 - 1.2  CBC with Differential     Status: Abnormal   Collection Time: 12/07/18  1:39 AM  Result Value Ref Range   WBC 11.1 (H) 4.0 - 10.5 K/uL   RBC 4.37 3.87 - 5.11 MIL/uL   Hemoglobin 10.7 (L) 12.0 - 15.0 g/dL   HCT 26.3 78.5 - 88.5 %   MCV 84.0 80.0 - 100.0 fL   MCH 24.5 (L) 26.0 - 34.0 pg   MCHC 29.2 (L) 30.0  - 36.0 g/dL   RDW 02.7 74.1 - 28.7 %   Platelets 201 150 - 400 K/uL   nRBC 0.0 0.0 - 0.2 %   Neutrophils Relative % 80 %   Neutro Abs 8.8 (H) 1.7 - 7.7 K/uL   Lymphocytes Relative 12 %   Lymphs Abs 1.4 0.7 - 4.0 K/uL   Monocytes Relative 7 %   Monocytes Absolute 0.8 0.1 - 1.0 K/uL   Eosinophils Relative 1 %   Eosinophils Absolute 0.1 0.0 - 0.5 K/uL   Basophils Relative 0 %   Basophils Absolute 0.0 0.0 - 0.1 K/uL   Immature Granulocytes 0 %   Abs Immature Granulocytes 0.03 0.00 - 0.07 K/uL  Basic metabolic panel     Status: Abnormal   Collection Time: 12/07/18  1:39 AM  Result Value Ref Range   Sodium 140 135 - 145 mmol/L   Potassium 3.5 3.5 - 5.1 mmol/L   Chloride 106 98 - 111 mmol/L   CO2 26 22 - 32 mmol/L   Glucose, Bld 201 (H) 70 - 99 mg/dL   BUN 11 8 - 23 mg/dL   Creatinine, Ser 8.67 0.44 - 1.00 mg/dL   Calcium 8.6 (L) 8.9 - 10.3 mg/dL   GFR calc non Af Amer 57 (L) >60 mL/min   GFR calc Af Amer >60 >60 mL/min   Anion gap 8 5 - 15   Imaging Studies: Ct Abdomen Pelvis W Contrast  Result Date: 12/07/2018 CLINICAL DATA:  Left lower quadrant pain, possible diverticulitis EXAM: CT ABDOMEN AND PELVIS WITH CONTRAST TECHNIQUE: Multidetector CT imaging of the abdomen and pelvis was performed using the standard protocol following bolus administration of intravenous contrast. CONTRAST:  OMNIPAQUE IOHEXOL 300 MG/ML  SOLN COMPARISON:  09/19/2017 FINDINGS: Lower chest: Basilar atelectasis is noted bilaterally. Hepatobiliary: No focal liver abnormality is seen. No gallstones, gallbladder wall thickening, or biliary dilatation. Pancreas: Unremarkable. No pancreatic ductal dilatation or surrounding inflammatory changes. Spleen: Normal in size without focal abnormality. Adrenals/Urinary Tract: Adrenal glands are within normal limits. The right kidney demonstrates a tiny nonobstructing stone not well appreciated on the  prior exam. Left renal cyst is again noted in the upper pole. No  obstructive changes are seen. The ureters are within normal limits. The bladder is well distended. Stomach/Bowel: Colon demonstrates evidence of diffuse diverticular disease without evidence of diverticulitis along the sigmoid colon. Considerable pericolonic inflammatory changes are noted as well as wall thickening. No discrete abscess is noted at this time. A relative phlegmon posterior to the sigmoid is seen. No definitive perforation is noted at this time. Remainder of the colon is within normal limits. The appendix is unremarkable. No small bowel abnormality is seen. Vascular/Lymphatic: Aortic atherosclerosis. No enlarged abdominal or pelvic lymph nodes. Reproductive: Status post hysterectomy. The left adnexa is involved in the inflammatory change surrounding the colon. No other adnexal abnormality is noted. Other: No abdominal wall hernia or abnormality. No abdominopelvic ascites. Musculoskeletal: Mild degenerative changes of lumbar spine are noted. IMPRESSION: Changes consistent with sigmoid diverticulitis without demonstrable abscess or perforation. Wall thickening and pericolonic phlegmon is noted. Nonobstructing right renal stone measuring 1-2 mm in the lower pole. Electronically Signed   By: Inez Catalina M.D.   On: 12/07/2018 03:09    ED COURSE and MDM  Nursing notes, initial and subsequent vitals signs, including pulse oximetry, reviewed and interpreted by myself.  Vitals:   12/07/18 0230 12/07/18 0310 12/07/18 0330 12/07/18 0400  BP: 131/62 (!) 124/106 128/60 137/67  Pulse: 99 (!) 102 (!) 104 (!) 104  Resp:  16    Temp:      TempSrc:      SpO2: (!) 88% 95% 93% 94%  Weight:      Height:       Medications  metroNIDAZOLE (FLAGYL) IVPB 500 mg (500 mg Intravenous New Bag/Given 12/07/18 0400)  ondansetron (ZOFRAN) injection 4 mg (4 mg Intravenous Given 12/07/18 0152)  fentaNYL (SUBLIMAZE) injection 50 mcg (50 mcg Intravenous Given 12/07/18 0151)  iohexol (OMNIPAQUE) 300 MG/ML solution 100  mL (100 mLs Intravenous Contrast Given 12/07/18 0248)  sodium chloride (PF) 0.9 % injection (  Given by Other 12/07/18 0319)  ciprofloxacin (CIPRO) tablet 500 mg (500 mg Oral Given 12/07/18 0400)   4:09 AM Patient given IV Flagyl and oral ciprofloxacin in the ED to initiate treatment for diverticulitis.  She appears stable for discharge and there is no evidence of abscess or perforation.  She was advised to return for worsening symptoms especially worsening pain, inability to keep food or drinks on her stomach or for fever. Message sent to her PCP, Dr. Dareen Piano, advising him of need for follow-up and need to follow her INRs closely due to potential interaction of her antibiotics with warfarin.  PROCEDURES  Procedures   ED DIAGNOSES     ICD-10-CM   1. Diverticulitis large intestine w/o perforation or abscess w/o bleeding  K57.32        Latriece Anstine, Jenny Reichmann, MD 12/07/18 [id number]

## 2018-12-07 NOTE — ED Notes (Signed)
Pt expressed some concerns about being discharged home, states she doesn't feel safe or comfortable enough so much so she isn't able to rest. Pt would not elaborate further and pts family at bedside states she has expressed the same concerns before but won't speak to the family about it because she doesn't want to be a burden. Social work consult requested for pt. Pt states she will speak with the social worker about her concerns.

## 2018-12-10 ENCOUNTER — Telehealth: Payer: Self-pay | Admitting: Pharmacist

## 2018-12-10 ENCOUNTER — Telehealth: Payer: Self-pay | Admitting: Internal Medicine

## 2018-12-10 NOTE — Telephone Encounter (Signed)
Call placed to patient. States she's feeling "a little bit better." Denies N/V/D, no fevers or chills. Has had one normal BM since discharge; no blood noted. Rates LLQ pain 6/10 at present. Last dose of hydrocodone was last evening at 2300. States she's drinking "a lot of water" and eating mashed potatoes and gravy. Appetite is decreased. C/o "little bit of burning" with urination. Wears pull ups and has not gotten up from her recliner this morning. States before she stands from reclined position she sits for about 20 minutes. Notes dizziness when moving from reclined to sitting position but is better upon standing. Also notes dizziness about 15 minutes after taking hydrocodone. Correctly states dosing for cipro and flagyl. She has left message for Dr. Elie Confer regarding her warfarin dosage and is awaiting a reply. At end of conversations she states, "I feel pretty good." L. [place], BSN, RN-BC

## 2018-12-10 NOTE — Telephone Encounter (Signed)
Called and offered pt an appt for today Per Dr. Dareen Piano.Pt states she is too weak to come in for an appt. Pt stating she is really weak when getting up and and has to sit down get her balance.

## 2018-12-10 NOTE — Telephone Encounter (Signed)
Spoke with patient. Advised her to OMIT/HOLD her warfarin, until she can RTC or her antibiotics are finished. She is on Cipro and Metronidazole--started on Saturday 28-NOV-20 she states. Her warfarin dose for Saturday and Sunday was only 1/2 x 4mg  (2mg ) for each of those days based upon last instructions she had been provided. She acknowledges only having taken 2mg  on Saturday and Sunday. She was to have had a RTC visit today for re-check by her physician for her diverticulitis. She states she is too weak to come to clinic. I asked her if she has had any contact(s) of known COVID positive friends or family/visitors to her home. She denies this. She denies fever, no respiratory symptoms. I advised that as soon as she feels strong enough to come to [facility] to do so for physician visit and to determine INR. Metronidazole and ciprofloxacin are known for potentiation of a marked hypoprothrombinemic response, hence the decision in her inability to come to clinic--to hold/omit warfarin dosing until she can come to clinic or until antibiotics are finished. She denies any signs or symptoms of bleeding; no extremity pain or tenderness and no dyspnea or chest pain.

## 2018-12-10 NOTE — Telephone Encounter (Signed)
-----   Message from Aldine Contes, MD sent at 12/10/2018  9:25 AM EST ----- Regarding: RE: Follow Up Thank you Chilon. She may need to go to the ED again. I ccd triage on this message. Thanks for the help ----- Message ----- From: Zola Button Sent: 12/10/2018   8:55 AM EST To: Aldine Contes, MD Subject: RE: Follow Up                                  Called and offered pt an appt for today and the pt states she is too weak to come in for an appt. Pt stating she is really weak when getting and and has to get her balance.  Will send a message to  triage to reach out to the patient as well.    Chilon ----- Message ----- From: Aldine Contes, MD Sent: 12/07/2018   8:25 AM EST To: Imp Holiday representative, Imp Triage Nurse Pool Subject: FW: Follow Up                                  Hello, This patient will need an appointment on Monday with [facility] as well as Dr. Elie Confer for follow up for diverticulitis as well as INR check as she is on Cipro.  Thank you, Dr. Dareen Piano ----- Message ----- From: Shanon Rosser, MD Sent: 12/07/2018   4:13 AM EST To: Aldine Contes, MD Subject: Follow Up                                      Ms. Forbis was diagnosed with diverticulitis in the emergency department this morning.  Her diverticulitis is associated with a phlegmon but no abscess or perforation and she appears stable for discharge.  She will need follow-up to ensure that her diverticulitis is resolving appropriately.  She will also need close monitoring of her INR as both Cipro and Flagyl can interact with warfarin.  We did not treat with Augmentin due to a penicillin allergy.

## 2018-12-10 NOTE — Telephone Encounter (Signed)
Patient has been called. Thank you.

## 2018-12-10 NOTE — Telephone Encounter (Signed)
Patient was called and advised that she needs to OMIT/HOLD warfarin, until such time she feels strong enough to come to clinic--or, until she has completed her antibiotics, both known to cause a hypoprothrombinemic response to her warfarin. She only commence the antibiotics on Saturday 08-Dec-2018. Her warfarin dose for Saturday and Sunday was only 2mg  warfarin (4mg  all other days). Warfarin is now on HOLD. She denies any signs or symptoms of bleeding. Was advised if she were to see any such signs/symptoms, to come to the ED.

## 2018-12-10 NOTE — Telephone Encounter (Signed)
Sounds reasonable. If she is that weak, we should get her seen soon.

## 2018-12-10 NOTE — Telephone Encounter (Signed)
See additional note. 

## 2018-12-12 NOTE — Telephone Encounter (Signed)
Called pt offered appt, pt states right now she just needs to rest she thinks, if "it lingers" she will call for an appt fri or mon

## 2018-12-17 ENCOUNTER — Other Ambulatory Visit: Payer: Self-pay

## 2018-12-17 ENCOUNTER — Telehealth: Payer: Self-pay | Admitting: Dietician

## 2018-12-17 ENCOUNTER — Other Ambulatory Visit: Payer: Self-pay | Admitting: Internal Medicine

## 2018-12-17 ENCOUNTER — Ambulatory Visit (INDEPENDENT_AMBULATORY_CARE_PROVIDER_SITE_OTHER): Payer: Medicare Other | Admitting: Pharmacist

## 2018-12-17 DIAGNOSIS — Z7901 Long term (current) use of anticoagulants: Secondary | ICD-10-CM | POA: Diagnosis not present

## 2018-12-17 DIAGNOSIS — Z5181 Encounter for therapeutic drug level monitoring: Secondary | ICD-10-CM | POA: Diagnosis not present

## 2018-12-17 DIAGNOSIS — Z86718 Personal history of other venous thrombosis and embolism: Secondary | ICD-10-CM

## 2018-12-17 DIAGNOSIS — I82409 Acute embolism and thrombosis of unspecified deep veins of unspecified lower extremity: Secondary | ICD-10-CM | POA: Diagnosis not present

## 2018-12-17 DIAGNOSIS — J301 Allergic rhinitis due to pollen: Secondary | ICD-10-CM

## 2018-12-17 LAB — POCT INR: INR: 2 (ref 2.0–3.0)

## 2018-12-17 NOTE — Patient Instructions (Signed)
Patient instructed to take medications as defined in the Anti-coagulation Track section of this encounter.  Patient instructed to take today's dose.  Patient instructed to take only one-half (1/2) on Tuesdays, Thursdays and Saturdays. All other days, take one (1) tablet on Sundays, Mondays, Wednesdays and Fridays. Patient verbalized understanding of these instructions.

## 2018-12-17 NOTE — Progress Notes (Signed)
Anticoagulation Management Caroline [name] is a 73 y.o. female who reports to the clinic for monitoring of warfarin treatment.    Indication: DVT , History of (resolved); Long term current use of anticoagulant.   Duration: indefinite Supervising physician: Carlynn Purl  Anticoagulation Clinic Visit History: Patient does not report signs/symptoms of bleeding or thromboembolism  Other recent changes: No diet, medications, lifestyle except as noted in patient findings.  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 Penicillins Anaphylaxis, Hives, Swelling and Rash    Has patient had a PCN reaction causing immediate rash, facial/tongue/throat swelling, SOB or lightheadedness with hypotension: Yes Has patient had a PCN reaction causing severe rash involving mucus membranes or skin necrosis: Yes Has patient had a PCN reaction that required hospitalization Yes Has patient had a PCN reaction occurring within the last 10 years: No If all of the above answers are "NO", then may proceed with Cephalosporin use.   . Cabbage Itching  . Keflex [Cephalexin] Hives    And feeling of throat tightness  . Shellfish Allergy Swelling  . Tomato Swelling  . Latex Itching and Rash    Current Outpatient Medications:  .  budesonide-formoterol (SYMBICORT) 160-4.5 MCG/ACT inhaler, Inhale 2 puffs into the lungs 2 (two) times daily., Disp: 3 Inhaler, Rfl: 4 .  calcium citrate-vitamin D (CITRACAL+D) 315-200 MG-UNIT tablet, TAKE 2 TABLETS DAILY BY MOUTH. (Patient taking differently: Take 2 tablets  by mouth daily. ), Disp: 160 tablet, Rfl: 2 .  EPINEPHrine 0.3 mg/0.3 mL IJ SOAJ injection, Inject 0.3 mg into the muscle as needed. Severe allergic reactions, Disp: , Rfl: 0 .  fluticasone (FLONASE) 50 MCG/ACT nasal spray, PLACE 2 SPRAYS DAILY INTO BOTH NOSTRILS. (Patient taking differently: Place 2 sprays into both nostrils daily. ), Disp: 16 g, Rfl: 1 .  furosemide (LASIX) 40 MG tablet, Take 1 tablet (40 mg total) by mouth every other day., Disp: 90 tablet, Rfl: 1 .  HYDROcodone-acetaminophen (NORCO) 5-325 MG tablet, Take 1 tablet by mouth every 6 (six) hours as needed for severe pain (may cause constipation)., Disp: 20 tablet, Rfl: 0 .  hydrocortisone (PROCTOSOL HC) 2.5 % rectal cream, Place 1 application rectally 2 (two) times daily., Disp: 30 g, Rfl: 0 .  losartan (COZAAR) 100 MG tablet, Take 1 tablet (100 mg total) by mouth daily., Disp: 90 tablet, Rfl: 2 .  magnesium oxide (MAG-OX) 400 MG tablet, Take 1 tablet (400 mg total) by mouth 2 (two) times daily., Disp: 90 tablet, Rfl: 3 .  meclizine (ANTIVERT) 25 MG tablet, TAKE 1 TABLET BY MOUTH THREE TIMES A DAY AS NEEDED, Disp: 90 tablet, Rfl: 1 .  metFORMIN (GLUCOPHAGE-XR) 500 MG 24 hr tablet, TAKE 1 TABLET BY MOUTH EVERY DAY WITH BREAKFAST (Patient taking differently: Take 500 mg by mouth daily with breakfast. ), Disp: 90 tablet, Rfl: 1 .  montelukast (SINGULAIR) 10 MG tablet, Take 1 tablet (10 mg total) by mouth at bedtime., Disp: 90 tablet,  Rfl: 3 .  pantoprazole (PROTONIX) 40 MG tablet, TAKE 1 TABLET BY MOUTH EVERY DAY (Patient taking differently: Take 40 mg by mouth daily. ), Disp: 90 tablet, Rfl: 1 .  phenol (CHLORASEPTIC) 1.4 % LIQD, Use as directed 2 sprays in the mouth or throat as needed for throat irritation / pain., Disp: 29 mL, Rfl: 0 .  PROAIR HFA 108 (90 Base) MCG/ACT inhaler, TAKE 2 PUFFS BY MOUTH EVERY 6 HOURS AS NEEDED FOR WHEEZE OR SHORTNESS OF BREATH (Patient taking differently: Inhale 2 puffs into the lungs every 6 (six) hours  as needed for wheezing or shortness of breath. ), Disp: 8.5 Inhaler, Rfl: 2 .  RESTASIS 0.05 % ophthalmic emulsion, PLACE 1 DROP INTO BOTH EYES 2 (TWO) TIMES DAILY. USE ONCE DAILY, Disp: 60 mL, Rfl: 0 .  rosuvastatin (CRESTOR) 20 MG tablet, Take 1 tablet (20 mg total) by mouth daily., Disp: 90 tablet, Rfl: 3 .  warfarin (COUMADIN) 4 MG tablet, Take 1/2 tablet on Sundays, Wednesdays and Saturdays. All other days, take one (1) tablet., Disp: 72 tablet, Rfl: 1 .  carvedilol (COREG) 12.5 MG tablet, Take 1 tablet (12.5 mg total) by mouth 2 (two) times daily., Disp: 180 tablet, Rfl: 2 .  ciprofloxacin (CIPRO) 500 MG tablet, Take 1 tablet (500 mg total) by mouth 2 (two) times daily. One po bid x 7 days (Patient not taking: Reported on 12/17/2018), Disp: 14 tablet, Rfl: 0 .  metroNIDAZOLE (FLAGYL) 500 MG tablet, Take 1 tablet (500 mg total) by mouth 3 (three) times daily. (Patient not taking: Reported on 12/17/2018), Disp: 21 tablet, Rfl: 0 Past Medical History:  Diagnosis Date  . Arthritis   . Asthma   . Bronchitis   . CHF (congestive heart failure) ([place])   . Diabetes ([place]) 2019  . DVT, lower extremity, recurrent ([place])    Patient had unprovoked PE on 2002 and DVT in right lower extremety 2008.  [name] GERD (gastroesophageal reflux disease)   . History of kidney stones   . Hypertension   . PE (pulmonary embolism)    Patient had unprovoked PE on 2002  . PONV (postoperative nausea and vomiting)   . Vertigo    Social History   Socioeconomic History  . Marital status: Divorced    Spouse name: Not on file  . Number of children: 5  . Years of education: 9 th grade  . Highest education level: 9th grade  Occupational History  . Occupation: Lexicographer  . Financial resource strain: Somewhat hard  . Food insecurity    Worry: Never true    Inability: Sometimes true  . Transportation needs    Medical: No    Non-medical: Yes  Tobacco Use  . Smoking status: Former Smoker    Types: Cigarettes     Quit date: 09/18/1988    Years since quitting: 30.2  . Smokeless tobacco: Never Used  . Tobacco comment: 6 pack year smoking history as a teen  Substance and Sexual Activity  . Alcohol use: No  . Drug use: No  . Sexual activity: Never    Birth control/protection: None  Lifestyle  . Physical activity    Days per week: 2 days    Minutes per session: 20 min  . Stress: To some extent  Relationships  . Social connections    Talks on phone: More than three times a week    Gets together: Once a week    Attends religious service: More than 4 times per  year    Active member of club or organization: No    Attends meetings of clubs or organizations: Never    Relationship status: Divorced  Other Topics Concern  . Not on file  Social History Narrative   Current Social History 05/17/2017        Patient lives with 56 yo granddaughter" in a/an home / condo / townhome which is 1 story/stories. There are not steps up to the entrance the patient uses.       Patient's method of transportation is church member.      The highest level of education was some high school.      The patient currently retired.      Identified important Relationships are God, family       Pets : 1 lab/pitt mix named Cinnamon       Interests / Fun: Church,TV       Current Stressors: "Me and my granddaughter"       Religious / Personal Beliefs: I'm Holiness and I love people"       Other: "I'd give away my last dime."    Family History  Problem Relation Age of Onset  . Cancer Mother   . Hypertension Sister   . Diabetes Sister   . Hypertension Brother   . Diabetes Brother   . Hypertension Sister   . Diabetes Sister   . Colon cancer Other   . Heart Problems Other 34       sister's child, open heart surgery  . CVA Father   . Diabetes Son   . Hypertension Son   . Kidney disease Son        on dialysis  . Hypertension Sister   . Diabetes Sister   . Hypertension Sister   . Diabetes Sister   . Cancer  Brother   . Hypertension Son   . Diabetes Son   . Hypertension Daughter   . Schizophrenia Daughter     ASSESSMENT Recent Results: The most recent result is correlated with 20 mg per week: Lab Results  Component Value Date   INR 2.0 12/17/2018   INR 2.7 (H) 12/07/2018   INR 2.2 12/03/2018    Anticoagulation Dosing: Description   Take one (1) tablet daily--except on Tuesdays, Thursdays and Saturdays--take only 1/2 tablet on Tuesdays, Thursdays and Saturdays.       INR today: Therapeutic  PLAN Weekly dose was increased by 10% to 22 mg per week  Patient Instructions  Patient instructed to take medications as defined in the Anti-coagulation Track section of this encounter.  Patient instructed to take today's dose.  Patient instructed to take only one-half (1/2) on Tuesdays, Thursdays and Saturdays. All other days, take one (1) tablet on Sundays, Mondays, Wednesdays and Fridays. Patient verbalized understanding of these instructions.    Patient advised to contact clinic or seek medical attention if signs/symptoms of bleeding or thromboembolism occur.  Patient verbalized understanding by repeating back information and was advised to contact me if further medication-related questions arise. Patient was also provided an information handout.  Follow-up Return in 2 weeks (on 12/31/2018) for Follow up INR.  Elicia Lamp, PharmD, CPP  15 minutes spent face-to-face with the patient during the encounter. 50% of time spent on education, including signs/sx bleeding and clotting, as well as food and drug interactions with warfarin. 50% of time was spent on fingerprick POC INR sample collection,processing, results determination, and documentation in [email or web address].

## 2018-12-17 NOTE — Telephone Encounter (Signed)
Caroline [name] reports having had an eye exam 6.8.2020 at the [facility] on [address]. Report requested.

## 2018-12-19 ENCOUNTER — Ambulatory Visit (INDEPENDENT_AMBULATORY_CARE_PROVIDER_SITE_OTHER): Payer: Medicare Other | Admitting: Internal Medicine

## 2018-12-19 ENCOUNTER — Encounter: Payer: Self-pay | Admitting: Internal Medicine

## 2018-12-19 DIAGNOSIS — J4521 Mild intermittent asthma with (acute) exacerbation: Secondary | ICD-10-CM

## 2018-12-19 DIAGNOSIS — K5792 Diverticulitis of intestine, part unspecified, without perforation or abscess without bleeding: Secondary | ICD-10-CM | POA: Diagnosis not present

## 2018-12-19 MED ORDER — MONTELUKAST SODIUM 10 MG PO TABS: 10.0000 mg | ORAL_TABLET | Freq: Every day | ORAL | 3 refills | Status: DC

## 2018-12-19 NOTE — Patient Instructions (Addendum)
Thank you for allowing [place] to provide your care. I'm glad to hear that your belly pain and nausea have improved. Continue to take all your medications as prescribed. If you develop worsening of your abdominal pain, fevers, or inability to eat or drink please call us immediately. I would like you to come back to see Dr. Dareen Piano in approximately three months or sooner if any issues arise.

## 2018-12-19 NOTE — Progress Notes (Signed)
   CC: diverticulitis  HPI:  [name] is a 73 y.o. female with PMHx listed below presenting for diverticulitis. Please see the A&P for the status of the patient's chronic medical problems.  Past Medical History:  Diagnosis Date  . Arthritis   . Asthma   . Bronchitis   . CHF (congestive heart failure) ([place])   . Diabetes ([place]) 2019  . DVT, lower extremity, recurrent ([place])    Patient had unprovoked PE on 2002 and DVT in right lower extremety 2008.  [name] GERD (gastroesophageal reflux disease)   . History of kidney stones   . Hypertension   . PE (pulmonary embolism)    Patient had unprovoked PE on 2002  . PONV (postoperative nausea and vomiting)   . Vertigo    Review of Systems:  Performed and all others negative.  Physical Exam: Vitals:   12/19/18 1049  BP: 136/76  Pulse: 78  Temp: 98.7 F (37.1 C)  TempSrc: Oral  SpO2: 99%  Weight: 267 lb 3.2 oz (121.2 kg)   General: Obese female in no acute distress Pulm: Good air movement with no wheezing or crackles  CV: RRR, no murmurs, no rubs  Abdomen: Active bowel sounds, soft, non-distended, no tenderness to palpation   Assessment & Plan:   See Encounters Tab for problem based charting.  Patient discussed with Dr. Dareen Piano

## 2018-12-20 DIAGNOSIS — K5792 Diverticulitis of intestine, part unspecified, without perforation or abscess without bleeding: Secondary | ICD-10-CM | POA: Insufficient documentation

## 2018-12-20 NOTE — Assessment & Plan Note (Addendum)
Patient presented to the clinic for follow-up after being seen in the ED on 11/27 for left lower quadrant abdominal pain. CT abdomen was consistent with diverticulitis. She was treated symptomatically in the ED and discharged on Cipro and Flagyl for seven days. Since that time she is completed these antibiotics. She continues to have some residual left lower quadrant abdominal pain but her diarrhea and nausea/vomiting have improved. She is no longer having fevers. She is tolerating PO intake.  I reviewed her labs from 11/27. All of these were consistent with baseline. There is no need to repeat labs today.  A/P: - Patient appears to be improving. Will continue to monitor. No further antibiotics at this point.

## 2018-12-20 NOTE — Progress Notes (Signed)
Internal Medicine Clinic Attending  Case discussed with Dr. Helberg at the time of the visit.  We reviewed the resident's history and exam and pertinent patient test results.  I agree with the assessment, diagnosis, and plan of care documented in the resident's note.    

## 2018-12-24 ENCOUNTER — Encounter: Payer: Self-pay | Admitting: Dietician

## 2018-12-24 NOTE — Telephone Encounter (Signed)
Called again for eye exam report.

## 2018-12-28 ENCOUNTER — Other Ambulatory Visit: Payer: Self-pay | Admitting: Cardiology

## 2018-12-28 ENCOUNTER — Other Ambulatory Visit: Payer: Self-pay | Admitting: Internal Medicine

## 2018-12-28 DIAGNOSIS — I5042 Chronic combined systolic (congestive) and diastolic (congestive) heart failure: Secondary | ICD-10-CM

## 2018-12-28 DIAGNOSIS — I10 Essential (primary) hypertension: Secondary | ICD-10-CM

## 2018-12-31 ENCOUNTER — Encounter (HOSPITAL_COMMUNITY): Payer: Self-pay | Admitting: Emergency Medicine

## 2018-12-31 ENCOUNTER — Ambulatory Visit: Payer: Medicare Other

## 2018-12-31 ENCOUNTER — Other Ambulatory Visit: Payer: Self-pay

## 2018-12-31 ENCOUNTER — Ambulatory Visit (INDEPENDENT_AMBULATORY_CARE_PROVIDER_SITE_OTHER): Payer: Medicare Other | Admitting: Internal Medicine

## 2018-12-31 ENCOUNTER — Telehealth: Payer: Self-pay | Admitting: Internal Medicine

## 2018-12-31 ENCOUNTER — Other Ambulatory Visit: Payer: Self-pay | Admitting: Cardiology

## 2018-12-31 ENCOUNTER — Inpatient Hospital Stay (HOSPITAL_COMMUNITY)
Admission: EM | Admit: 2018-12-31 | Discharge: 2019-01-04 | DRG: 392 | Disposition: A | Discharge status: Home Health | Payer: Medicare Other | Attending: Internal Medicine | Admitting: Internal Medicine

## 2018-12-31 ENCOUNTER — Emergency Department (HOSPITAL_COMMUNITY): Payer: Medicare Other

## 2018-12-31 DIAGNOSIS — Z86711 Personal history of pulmonary embolism: Secondary | ICD-10-CM

## 2018-12-31 DIAGNOSIS — M1990 Unspecified osteoarthritis, unspecified site: Secondary | ICD-10-CM | POA: Diagnosis not present

## 2018-12-31 DIAGNOSIS — Z7901 Long term (current) use of anticoagulants: Secondary | ICD-10-CM

## 2018-12-31 DIAGNOSIS — Z79899 Other long term (current) drug therapy: Secondary | ICD-10-CM

## 2018-12-31 DIAGNOSIS — Z7951 Long term (current) use of inhaled steroids: Secondary | ICD-10-CM

## 2018-12-31 DIAGNOSIS — E876 Hypokalemia: Secondary | ICD-10-CM | POA: Diagnosis present

## 2018-12-31 DIAGNOSIS — Z91040 Latex allergy status: Secondary | ICD-10-CM

## 2018-12-31 DIAGNOSIS — D649 Anemia, unspecified: Secondary | ICD-10-CM | POA: Diagnosis present

## 2018-12-31 DIAGNOSIS — I5022 Chronic systolic (congestive) heart failure: Secondary | ICD-10-CM | POA: Diagnosis present

## 2018-12-31 DIAGNOSIS — I110 Hypertensive heart disease with heart failure: Secondary | ICD-10-CM | POA: Diagnosis not present

## 2018-12-31 DIAGNOSIS — Z823 Family history of stroke: Secondary | ICD-10-CM | POA: Diagnosis not present

## 2018-12-31 DIAGNOSIS — Z818 Family history of other mental and behavioral disorders: Secondary | ICD-10-CM | POA: Diagnosis not present

## 2018-12-31 DIAGNOSIS — I5089 Other heart failure: Secondary | ICD-10-CM | POA: Diagnosis not present

## 2018-12-31 DIAGNOSIS — I959 Hypotension, unspecified: Secondary | ICD-10-CM | POA: Diagnosis present

## 2018-12-31 DIAGNOSIS — Z91018 Allergy to other foods: Secondary | ICD-10-CM | POA: Diagnosis not present

## 2018-12-31 DIAGNOSIS — Z8719 Personal history of other diseases of the digestive system: Secondary | ICD-10-CM

## 2018-12-31 DIAGNOSIS — R42 Dizziness and giddiness: Secondary | ICD-10-CM | POA: Diagnosis not present

## 2018-12-31 DIAGNOSIS — Z87891 Personal history of nicotine dependence: Secondary | ICD-10-CM | POA: Diagnosis not present

## 2018-12-31 DIAGNOSIS — R791 Abnormal coagulation profile: Secondary | ICD-10-CM

## 2018-12-31 DIAGNOSIS — K5732 Diverticulitis of large intestine without perforation or abscess without bleeding: Principal | ICD-10-CM | POA: Diagnosis present

## 2018-12-31 DIAGNOSIS — I10 Essential (primary) hypertension: Secondary | ICD-10-CM

## 2018-12-31 DIAGNOSIS — Z91013 Allergy to seafood: Secondary | ICD-10-CM | POA: Diagnosis not present

## 2018-12-31 DIAGNOSIS — R7303 Prediabetes: Secondary | ICD-10-CM | POA: Diagnosis not present

## 2018-12-31 DIAGNOSIS — I824Y9 Acute embolism and thrombosis of unspecified deep veins of unspecified proximal lower extremity: Secondary | ICD-10-CM

## 2018-12-31 DIAGNOSIS — Z8249 Family history of ischemic heart disease and other diseases of the circulatory system: Secondary | ICD-10-CM | POA: Diagnosis not present

## 2018-12-31 DIAGNOSIS — E860 Dehydration: Secondary | ICD-10-CM | POA: Diagnosis present

## 2018-12-31 DIAGNOSIS — R197 Diarrhea, unspecified: Secondary | ICD-10-CM | POA: Diagnosis not present

## 2018-12-31 DIAGNOSIS — Z881 Allergy status to other antibiotic agents status: Secondary | ICD-10-CM

## 2018-12-31 DIAGNOSIS — R1032 Left lower quadrant pain: Secondary | ICD-10-CM

## 2018-12-31 DIAGNOSIS — Z880 Allergy status to penicillin: Secondary | ICD-10-CM | POA: Diagnosis not present

## 2018-12-31 DIAGNOSIS — Z20828 Contact with and (suspected) exposure to other viral communicable diseases: Secondary | ICD-10-CM | POA: Diagnosis present

## 2018-12-31 DIAGNOSIS — Z86718 Personal history of other venous thrombosis and embolism: Secondary | ICD-10-CM | POA: Diagnosis not present

## 2018-12-31 DIAGNOSIS — R1030 Lower abdominal pain, unspecified: Secondary | ICD-10-CM

## 2018-12-31 DIAGNOSIS — Z833 Family history of diabetes mellitus: Secondary | ICD-10-CM | POA: Diagnosis not present

## 2018-12-31 DIAGNOSIS — E119 Type 2 diabetes mellitus without complications: Secondary | ICD-10-CM | POA: Diagnosis present

## 2018-12-31 DIAGNOSIS — F329 Major depressive disorder, single episode, unspecified: Secondary | ICD-10-CM | POA: Diagnosis not present

## 2018-12-31 DIAGNOSIS — K219 Gastro-esophageal reflux disease without esophagitis: Secondary | ICD-10-CM | POA: Diagnosis present

## 2018-12-31 DIAGNOSIS — K5792 Diverticulitis of intestine, part unspecified, without perforation or abscess without bleeding: Secondary | ICD-10-CM

## 2018-12-31 DIAGNOSIS — N179 Acute kidney failure, unspecified: Secondary | ICD-10-CM | POA: Diagnosis not present

## 2018-12-31 DIAGNOSIS — R109 Unspecified abdominal pain: Secondary | ICD-10-CM | POA: Diagnosis not present

## 2018-12-31 DIAGNOSIS — J45909 Unspecified asthma, uncomplicated: Secondary | ICD-10-CM | POA: Diagnosis not present

## 2018-12-31 HISTORY — DX: Diverticulitis of intestine, part unspecified, without perforation or abscess without bleeding: K57.92

## 2018-12-31 LAB — LIPASE, BLOOD: Lipase: 31 U/L (ref 11–51)

## 2018-12-31 LAB — COMPREHENSIVE METABOLIC PANEL
ALT: 12 U/L (ref 0–44)
AST: 14 U/L — ABNORMAL LOW (ref 15–41)
Albumin: 2.8 g/dL — ABNORMAL LOW (ref 3.5–5.0)
Alkaline Phosphatase: 53 U/L (ref 38–126)
Anion gap: 13 (ref 5–15)
BUN: 13 mg/dL (ref 8–23)
CO2: 30 mmol/L (ref 22–32)
Calcium: 9.1 mg/dL (ref 8.9–10.3)
Chloride: 94 mmol/L — ABNORMAL LOW (ref 98–111)
Creatinine, Ser: 1.78 mg/dL — ABNORMAL HIGH (ref 0.44–1.00)
GFR calc Af Amer: 33 mL/min — ABNORMAL LOW (ref >60)
GFR calc non Af Amer: 28 mL/min — ABNORMAL LOW (ref >60)
Glucose, Bld: 157 mg/dL — ABNORMAL HIGH (ref 70–99)
Potassium: 3.1 mmol/L — ABNORMAL LOW (ref 3.5–5.1)
Sodium: 137 mmol/L (ref 135–145)
Total Bilirubin: 0.8 mg/dL (ref 0.3–1.2)
Total Protein: 7.3 g/dL (ref 6.5–8.1)

## 2018-12-31 LAB — LACTIC ACID, PLASMA
Lactic Acid, Venous: 1.1 mmol/L (ref 0.5–1.9)
Lactic Acid, Venous: 0.7 mmol/L (ref 0.5–1.9)

## 2018-12-31 LAB — CBC
HCT: 33.4 % — ABNORMAL LOW (ref 36.0–46.0)
Hemoglobin: 10.3 g/dL — ABNORMAL LOW (ref 12.0–15.0)
MCH: 24.7 pg — ABNORMAL LOW (ref 26.0–34.0)
MCHC: 30.8 g/dL (ref 30.0–36.0)
MCV: 80.1 fL (ref 80.0–100.0)
Platelets: 223 10*3/uL (ref 150–400)
RBC: 4.17 MIL/uL (ref 3.87–5.11)
RDW: 14.3 % (ref 11.5–15.5)
WBC: 13.1 10*3/uL — ABNORMAL HIGH (ref 4.0–10.5)
nRBC: 0 % (ref 0.0–0.2)

## 2018-12-31 LAB — MAGNESIUM: Magnesium: 1.5 mg/dL — ABNORMAL LOW (ref 1.7–2.4)

## 2018-12-31 LAB — PROTIME-INR
INR: 8.2 (ref 0.8–1.2)
Prothrombin Time: 68.5 seconds — ABNORMAL HIGH (ref 11.4–15.2)

## 2018-12-31 LAB — SARS CORONAVIRUS 2 (TAT 6-24 HRS): SARS Coronavirus 2: NEGATIVE

## 2018-12-31 MED ORDER — FLUTICASONE PROPIONATE 50 MCG/ACT NA SUSP
2.0000 | Freq: Every day | NASAL | Status: DC
  Administered 2019-01-01 – 2019-01-04 (×4): 2 via NASAL
  Filled 2018-12-31 (×2): qty 16

## 2018-12-31 MED ORDER — WARFARIN - PHARMACIST DOSING INPATIENT: Freq: Every day | Status: DC

## 2018-12-31 MED ORDER — MOMETASONE FURO-FORMOTEROL FUM 200-5 MCG/ACT IN AERO
2.0000 | INHALATION_SPRAY | Freq: Two times a day (BID) | RESPIRATORY_TRACT | Status: DC
  Administered 2018-12-31 – 2019-01-04 (×8): 2 via RESPIRATORY_TRACT
  Filled 2018-12-31: qty 8.8

## 2018-12-31 MED ORDER — ALBUTEROL SULFATE HFA 108 (90 BASE) MCG/ACT IN AERS
2.0000 | INHALATION_SPRAY | Freq: Four times a day (QID) | RESPIRATORY_TRACT | Status: DC | PRN
  Filled 2018-12-31: qty 6.7

## 2018-12-31 MED ORDER — IOHEXOL 300 MG/ML  SOLN
75.0000 mL | Freq: Once | INTRAMUSCULAR | Status: AC | PRN
  Administered 2018-12-31: 75 mL via INTRAVENOUS

## 2018-12-31 MED ORDER — ACETAMINOPHEN 325 MG PO TABS
650.0000 mg | ORAL_TABLET | Freq: Four times a day (QID) | ORAL | Status: DC | PRN
  Administered 2019-01-02: 650 mg via ORAL
  Filled 2018-12-31: qty 2

## 2018-12-31 MED ORDER — CYCLOSPORINE 0.05 % OP EMUL
1.0000 [drp] | Freq: Two times a day (BID) | OPHTHALMIC | Status: DC
  Administered 2018-12-31 – 2019-01-04 (×7): 1 [drp] via OPHTHALMIC
  Filled 2018-12-31 (×12): qty 30

## 2018-12-31 MED ORDER — ACETAMINOPHEN 325 MG PO TABS: 650.0000 mg | ORAL_TABLET | Freq: Four times a day (QID) | ORAL | Status: DC | PRN

## 2018-12-31 MED ORDER — HYDROMORPHONE HCL 1 MG/ML IJ SOLN: 0.5000 mg | INTRAMUSCULAR | Status: DC | PRN

## 2018-12-31 MED ORDER — ACETAMINOPHEN 650 MG RE SUPP: 650.0000 mg | Freq: Four times a day (QID) | RECTAL | Status: DC | PRN

## 2018-12-31 MED ORDER — METRONIDAZOLE IN NACL 5-0.79 MG/ML-% IV SOLN
500.0000 mg | Freq: Four times a day (QID) | INTRAVENOUS | Status: DC
  Administered 2018-12-31 – 2019-01-04 (×15): 500 mg via INTRAVENOUS
  Filled 2018-12-31 (×15): qty 100

## 2018-12-31 MED ORDER — ACETAMINOPHEN 10 MG/ML IV SOLN
1000.0000 mg | Freq: Three times a day (TID) | INTRAVENOUS | Status: AC
  Administered 2018-12-31: 1000 mg via INTRAVENOUS
  Filled 2018-12-31 (×2): qty 100

## 2018-12-31 MED ORDER — MAGNESIUM SULFATE 2 GM/50ML IV SOLN
2.0000 g | Freq: Once | INTRAVENOUS | Status: AC
  Administered 2018-12-31: 2 g via INTRAVENOUS
  Filled 2018-12-31: qty 50

## 2018-12-31 MED ORDER — MONTELUKAST SODIUM 10 MG PO TABS
10.0000 mg | ORAL_TABLET | Freq: Every day | ORAL | Status: DC
  Administered 2018-12-31 – 2019-01-03 (×4): 10 mg via ORAL
  Filled 2018-12-31 (×5): qty 1

## 2018-12-31 MED ORDER — SODIUM CHLORIDE 0.9 % IV BOLUS
1000.0000 mL | Freq: Once | INTRAVENOUS | Status: AC
  Administered 2018-12-31: 1000 mL via INTRAVENOUS

## 2018-12-31 MED ORDER — POTASSIUM CHLORIDE 2 MEQ/ML IV SOLN
INTRAVENOUS | Status: DC
  Filled 2018-12-31 (×5): qty 1000

## 2018-12-31 MED ORDER — PANTOPRAZOLE SODIUM 40 MG IV SOLR
40.0000 mg | INTRAVENOUS | Status: DC
  Administered 2019-01-01 – 2019-01-03 (×3): 40 mg via INTRAVENOUS
  Filled 2018-12-31 (×3): qty 40

## 2018-12-31 MED ORDER — SODIUM CHLORIDE 0.9 % IV SOLN
2.0000 g | Freq: Two times a day (BID) | INTRAVENOUS | Status: DC
  Administered 2018-12-31 – 2019-01-04 (×9): 2 g via INTRAVENOUS
  Filled 2018-12-31 (×10): qty 2

## 2018-12-31 MED ORDER — LACTATED RINGERS IV BOLUS: 500.0000 mL | Freq: Once | INTRAVENOUS | Status: DC

## 2018-12-31 MED ORDER — PANTOPRAZOLE SODIUM 40 MG PO TBEC
40.0000 mg | DELAYED_RELEASE_TABLET | ORAL | Status: DC
  Administered 2019-01-04: 40 mg via ORAL
  Filled 2018-12-31 (×2): qty 1

## 2018-12-31 NOTE — ED Triage Notes (Signed)
Pt reports lower abd pain and vomiting since Thanksgiving. Reports only being able to eat water and saltines. Endorses N/V/D.

## 2018-12-31 NOTE — ED Notes (Signed)
MD aware of pt INR

## 2018-12-31 NOTE — Progress Notes (Signed)
Pharmacy Antibiotic Note  Caroline [name] is a 73 y.o. female admitted on 12/31/2018 with Diverticulitis .  Pharmacy has been consulted for a patient with a Severe PCN allergy and originally asked to dose Meropenem.  Height: 5\' 2"  (157.5 cm) Weight: 267 lb 3.2 oz (121.2 kg) IBW/kg (Calculated) : 50.1  Temp (24hrs), Avg:98.6 F (37 C), Min:98.6 F (37 C), Max:98.6 F (37 C)  Recent Labs  Lab 12/31/18 1205 12/31/18 1300  WBC 13.1*  --   CREATININE 1.78*  --   LATICACIDVEN  --  1.1    Estimated Creatinine Clearance: 35.9 mL/min (A) (by C-G formula based on SCr of 1.78 mg/dL (H)).    Allergies  Allergen Reactions  . Penicillins Anaphylaxis, Hives, Swelling and Rash    Has patient had a PCN reaction causing immediate rash, facial/tongue/throat swelling, SOB or lightheadedness with hypotension: Yes Has patient had a PCN reaction causing severe rash involving mucus membranes or skin necrosis: Yes Has patient had a PCN reaction that required hospitalization Yes Has patient had a PCN reaction occurring within the last 10 years: No If all of the above answers are "NO", then may proceed with Cephalosporin use.   . Cabbage Itching  . Keflex [Cephalexin] Hives    And feeling of throat tightness  . Shellfish Allergy Swelling  . Tomato Swelling  . Latex Itching and Rash    Antimicrobials this admission: 12/21 Cefepime >>  12/21 Flagyl  >>   Dose adjustments this admission:   Microbiology results:  Plan:  - After chart review noted patient had received a few days of treatment with Cefepime.  - Spoke with provider and was okay with Cefepime and Flagyl for this patient as it appears Cipro + Flagyl has failed as outpatient treatment for this patient.  - Dose Cefepime 2g IV q12h - Since recurrence and using 4th gen cephalosporin with increase dose of Flagyl to 500mg  IV q6h    Thank you for allowing pharmacy to be a part of this patient's care.  Duanne Limerick PharmD. BCPS   12/31/2018 2:55 PM

## 2018-12-31 NOTE — Telephone Encounter (Signed)
Pt is requesting a nurse to call back pt is not feeling good, [phone]

## 2018-12-31 NOTE — H&P (Signed)
Date: 12/31/2018               Patient Name:  Caroline [name] MRN: [id number]  DOB: April 18, 1945 Age / Sex: 73 y.o., female   PCP: Earl Lagos, MD         Medical Service: Internal Medicine Teaching Service         Attending Physician: Dr. Inez Catalina, MD    First Contact: Sande Brothers, MD, Viviann Spare Pager: SW [phone])  Second Contact: Nedra Hai, MD, Ivin Booty Pager: Eustaquio Maize [phone])       After Hours (After 5p/  First Contact Pager: [phone]  weekends / holidays): Second Contact Pager: [phone]   Chief Complaint: lower quadrant abdominal pain  History of Present Illness: 73 y.o. yo female w/ PMH significant for Asthma, Diverticulitis, DVT's recurrent, HFmrEF, HTN, MDD, Prediabetes, constipation .  Presents with recurrent diverticulitis 2-3 episodes since 2019.  This year she was diagnosed with diverticulitis on Thanksgiving and was sent home on oral antibiotics cipro/flagyl for a week course and hydrocodone for pain and reports feeling better after this.  However on December 9th she began feeling some abdominal discomfort again, recurrence of her diarrhea at around 2 episodes per day and return of her fatigue and was seen in our clinic that day and at that time no further antibiotics, labs or imaging performed due to the infrequent episodes of diarrhea which was an improvement from her presenting symptoms and felt to be residual symptoms.  From this point on she feels the pain (which is now almost constant), watery diarrhea, fatigue and poor appetite has become progressively worse she averages about 4 episodes of watery non bloody diarrhea daily and she started vomiting clear liquid this morning and called the clinic for further evaluation and was instructed to be seen in clinic vs the ED.  Her last food intake was some grits and apple sauce on Saturday which caused her more diarrhea and discomfort.  She is able to drink some ginger ale here and there and some flavored bottle water.  She is able  to drink about a glass and a half per day.  When she is well she drinks about 4 bottles of water per day.  She drinks only bottled water, eats meals with her grandaughter who hasn't been sick.  She has not been around any chickens or other live animals.  She denies fevers or chills, shortness of breath. She denies sick contacts has been wearing her mask regularly.  She has a chronic cough associated with her asthma which has not worsened. She also has chronic constipation and switched to lactose free milk in September which seemed to help her regularity she reported at that time having around 1-2 bm per day.      Meds:  No outpatient medications have been marked as taking for the 12/31/18 encounter [facility] Encounter).     Allergies: Allergies as of 12/31/2018 - Review Complete 12/31/2018  Allergen Reaction Noted  . Penicillins Anaphylaxis, Hives, Swelling, and Rash 06/10/2006  . Cabbage Itching 11/24/2015  . Keflex [cephalexin] Hives 09/26/2016  . Shellfish allergy Swelling 09/13/2011  . Tomato Swelling 09/13/2011  . Latex Itching and Rash 11/24/2015   Past Medical History:  Diagnosis Date  . Arthritis   . Asthma   . Bronchitis   . CHF (congestive heart failure) ([facility])   . Diabetes ([facility]) 2019  . DVT, lower extremity, recurrent ([facility])    Patient had unprovoked PE on 2002 and DVT in right lower  extremety 2008.  [name] GERD (gastroesophageal reflux disease)   . History of kidney stones   . Hypertension   . PE (pulmonary embolism)    Patient had unprovoked PE on 2002  . PONV (postoperative nausea and vomiting)   . Vertigo     Family History:  Family History  Problem Relation Age of Onset  . Cancer Mother   . Hypertension Sister   . Diabetes Sister   . Hypertension Brother   . Diabetes Brother   . Hypertension Sister   . Diabetes Sister   . Colon cancer Other   . Heart Problems Other 34       sister's child, open heart surgery  . CVA Father   . Diabetes Son   . Hypertension  Son   . Kidney disease Son        on dialysis  . Hypertension Sister   . Diabetes Sister   . Hypertension Sister   . Diabetes Sister   . Cancer Brother   . Hypertension Son   . Diabetes Son   . Hypertension Daughter   . Schizophrenia Daughter      Social History:  Social History   Tobacco Use  . Smoking status: Former Smoker    Types: Cigarettes    Quit date: 09/18/1988    Years since quitting: 30.3  . Smokeless tobacco: Never Used  . Tobacco comment: 6 pack year smoking history as a teen  Substance Use Topics  . Alcohol use: No  . Drug use: No     Review of Systems: A complete ROS was negative except as per HPI.   Physical Exam: Blood pressure (!) 115/59, pulse 83, temperature 98.6 F (37 C), temperature source Oral, resp. rate 16, height 5\' 2"  (1.575 m), weight 121.2 kg, SpO2 97 %. Physical Exam Constitutional:      General: She is not in acute distress.    Appearance: She is not diaphoretic.  HENT:     Head: Normocephalic and atraumatic.  Cardiovascular:     Rate and Rhythm: Normal rate and regular rhythm.     Heart sounds: No murmur. No friction rub. No gallop.      Comments: Heart sounds distant but no obvious murmur rubs or gallops Pulmonary:     Effort: Pulmonary effort is normal. No respiratory distress.     Breath sounds: Normal breath sounds. No wheezing or rales.  Chest:     Chest wall: No tenderness.  Abdominal:     General: Bowel sounds are normal. There is no distension.     Palpations: Abdomen is soft. There is no mass.     Tenderness: There is generalized abdominal tenderness (Lower Quadrant > generalized) and tenderness in the right lower quadrant and left lower quadrant. There is no guarding or rebound.  Skin:    General: Skin is warm and dry.  Neurological:     Mental Status: She is alert.  Psychiatric:        Mood and Affect: Mood normal.        Behavior: Behavior normal.      EKG: personally reviewed my interpretation is none  available  CXR: personally reviewed my interpretation is none available  Assessment & Plan by Problem: Active Problems:   Diverticulitis  Diverticulitis: Symptoms and imaging findings have progressed since initial treatment at the end of November.  She has not been on a bowel regimen other than switching to lactose free milk.  She is hemodynamically stable, there is no  drainable abscess or signs of perforation on exam or imaging.    -pt started on cefepime and flagyl agree with continuing -IVF resuscitation -C. Diff testing ordered,  agree with ruling this out, I am hoping this is less likely given relatively low frequency of bowel movements -pain control, IV tylenol and breakthrough pain treated with IV dilaudid -will need bowel regimen going forward and follow up colonoscopy around 6 wks post discharge   AKI: likely prerenal in the setting of volume loss from GI illness and taking antihypertensive medications many of which are renally active.   -hold bp meds -IVF resuscitation -cmp tomorrow am  Asthma: stable on home inhaler therapy, she reports no exacerbation of her symptoms currently and her examination is corroborative   -continue LABA/ICS and albuterol PRN -continue flonase and monteleukast  Recurrent DVT's, PE: on chronic warfarin therapy, this occurred many years ago and has had no recent recurrence.  Her INR is supratherapeutic on presentation but there are no subjective or objective signs of bleeding and her hemoglobin is at her baseline.     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  She is hypotensive and volume deplete on examination from GI losses in addition to taking her diuretics faithfully.    -hold losartan, lasix, carvedilol -can likely start carvedilol back tomorrow    Dispo: Admit patient to Inpatient with expected length of stay greater than 2  midnights.  Signed: , MD 12/31/2018, 4:14 PM

## 2018-12-31 NOTE — Progress Notes (Signed)
Internal Medicine Clinic Attending  Case discussed with Dr. Helberg at the time of the visit.  We reviewed the resident's history and exam and pertinent patient test results.  I agree with the assessment, diagnosis, and plan of care documented in the resident's note.    

## 2018-12-31 NOTE — ED Provider Notes (Signed)
[facility] EMERGENCY DEPARTMENT Provider Note   CSN: [id number] Arrival date & time: 12/31/18  1155     History Chief Complaint  Patient presents with  . Abdominal Pain    Caroline [name] is a 73 y.o. female.  The history is provided by the patient and medical records. No language interpreter was used.  Abdominal Pain  Caroline [name] is a 73 y.o. female who presents to the Emergency Department complaining of abdominal pain. She presents to the emergency department for evaluation of progressive abdominal pain. She was treated around Thanksgiving for diverticulitis with antibiotics. She only partially recovered from that illness. She developed recurrent left lower quadrant abdominal pain about two weeks ago. She is experiencing persistent diarrhea, last BM yesterday. Today she has associated nausea and vomiting. She denies any fevers. She does have occasional dysuria. She is not currently on antibiotics. She does feel very dizzy when she tries to get up.    Past Medical History:  Diagnosis Date  . Arthritis   . Asthma   . Bronchitis   . CHF (congestive heart failure) (HCC)   . Diabetes (HCC) 2019  . DVT, lower extremity, recurrent (HCC)    Patient had unprovoked PE on 2002 and DVT in right lower extremety 2008.  [name] GERD (gastroesophageal reflux disease)   . History of kidney stones   . Hypertension   . PE (pulmonary embolism)    Patient had unprovoked PE on 2002  . PONV (postoperative nausea and vomiting)   . Vertigo     Patient Active Problem List   Diagnosis Date Noted  . Diverticulitis 12/20/2018  . Lower abdominal pain 02/20/2018  . GERD (gastroesophageal reflux disease) 01/30/2018  . Hx of bee sting allergy 10/31/2017  . Depression 10/31/2017  . Hematuria, microscopic 09/13/2017  . Asthma, chronic, mild persistent, uncomplicated 08/29/2017  . Type 2 diabetes mellitus with complication, without long-term current use of insulin (HCC) 06/02/2017  .  Hypokalemia 05/01/2017  . History of Clostridium difficile colitis 12/31/2016  . Anticoagulated on warfarin   . Orthostatic hypotension   . Lipodermatosclerosis 10/11/2016  . Morbid obesity with BMI of 50.0-59.9, adult (HCC) 05/02/2016  . Chronic combined systolic and diastolic CHF, NYHA class 1 (HCC) 09/21/2015  . DVT, lower extremity, recurrent (HCC)   . Vertigo 09/13/2011  . Healthcare maintenance 09/13/2011  . Allergic rhinitis 06/21/2006  . Hyperlipidemia 06/10/2006  . Essential hypertension 06/10/2006    Past Surgical History:  Procedure Laterality Date  . ABDOMINAL HYSTERECTOMY     1980  . CYSTOSCOPY W/ URETERAL STENT PLACEMENT Right 09/16/2015   Procedure: CYSTOSCOPY WITH RETROGRADE PYELOGRAM/URETERAL STENT PLACEMENT;  Surgeon: Alfredo Martinez, MD;  Location: [facility] OR;  Service: Urology;  Laterality: Right;  . CYSTOSCOPY WITH RETROGRADE PYELOGRAM, URETEROSCOPY AND STENT PLACEMENT Right 12/11/2015   Procedure: RIGHT URETEROSCOPY/HOLMIUM LASER LITHOTRIPSY AND STONE REMOVAL removal and placement of double j stent;  Surgeon: Crist Fat, MD;  Location: [facility] ORS;  Service: Urology;  Laterality: Right;  . HOLMIUM LASER APPLICATION Right 12/11/2015   Procedure: HOLMIUM LASER APPLICATION;  Surgeon: Crist Fat, MD;  Location: [facility] ORS;  Service: Urology;  Laterality: Right;     OB History   No obstetric history on file.     Family History  Problem Relation Age of Onset  . Cancer Mother   . Hypertension Sister   . Diabetes Sister   . Hypertension Brother   . Diabetes Brother   . Hypertension Sister   .  Diabetes Sister   . Colon cancer Other   . Heart Problems Other 34       sister's child, open heart surgery  . CVA Father   . Diabetes Son   . Hypertension Son   . Kidney disease Son        on dialysis  . Hypertension Sister   . Diabetes Sister   . Hypertension Sister   . Diabetes Sister   . Cancer Brother   . Hypertension Son   . Diabetes Son   . Hypertension  Daughter   . Schizophrenia Daughter     Social History   Tobacco Use  . Smoking status: Former Smoker    Types: Cigarettes    Quit date: 09/18/1988    Years since quitting: 30.3  . Smokeless tobacco: Never Used  . Tobacco comment: 6 pack year smoking history as a teen  Substance Use Topics  . Alcohol use: No  . Drug use: No    Home Medications Prior to Admission medications   Medication Sig Start Date End Date Taking? Authorizing Provider  budesonide-formoterol (SYMBICORT) 160-4.5 MCG/ACT inhaler Inhale 2 puffs into the lungs 2 (two) times daily. 10/08/18   Earl Lagos, MD  calcium citrate-vitamin D (CITRACAL+D) 315-200 MG-UNIT tablet TAKE 2 TABLETS DAILY BY MOUTH. Patient taking differently: Take 2 tablets by mouth daily.  03/27/18   Earl Lagos, MD  carvedilol (COREG) 12.5 MG tablet TAKE 1 TABLET BY MOUTH 2 TIMES DAILY. 12/31/18 01/30/19  Camnitz, Andree Coss, MD  EPINEPHrine 0.3 mg/0.3 mL IJ SOAJ injection Inject 0.3 mg into the muscle as needed. Severe allergic reactions 10/30/17   [provider]  fluticasone (FLONASE) 50 MCG/ACT nasal spray PLACE 2 SPRAYS DAILY INTO BOTH NOSTRILS. 12/17/18   Earl Lagos, MD  furosemide (LASIX) 40 MG tablet TAKE 1 TABLET BY MOUTH EVERY OTHER DAY 12/28/18   Tyson Alias, MD  HYDROcodone-acetaminophen [facility]) 5-325 MG tablet Take 1 tablet by mouth every 6 (six) hours as needed for severe pain (may cause constipation). 12/07/18   Molpus, John, MD  hydrocortisone (PROCTOSOL HC) 2.5 % rectal cream Place 1 application rectally 2 (two) times daily. 10/26/17   Earl Lagos, MD  losartan (COZAAR) 100 MG tablet Take 1 tablet (100 mg total) by mouth daily. Due for follow up with Dr. Elberta Fortis February 2021.  Please call to schedule. 12/28/18   Camnitz, Andree Coss, MD  magnesium oxide (MAG-OX) 400 MG tablet Take 1 tablet (400 mg total) by mouth 2 (two) times daily. 03/05/18   Camnitz, Will [name], MD  meclizine (ANTIVERT) 25  MG tablet TAKE 1 TABLET BY MOUTH THREE TIMES A DAY AS NEEDED 11/22/18   Earl Lagos, MD  metFORMIN (GLUCOPHAGE-XR) 500 MG 24 hr tablet TAKE 1 TABLET BY MOUTH EVERY DAY WITH BREAKFAST Patient taking differently: Take 500 mg by mouth daily with breakfast.  07/20/18   Earl Lagos, MD  metroNIDAZOLE (FLAGYL) 500 MG tablet Take 1 tablet (500 mg total) by mouth 3 (three) times daily. Patient not taking: Reported on 12/17/2018 12/07/18   Molpus, John, MD  montelukast (SINGULAIR) 10 MG tablet Take 1 tablet (10 mg total) by mouth at bedtime. 12/19/18   Helberg, Jill Alexanders, MD  pantoprazole (PROTONIX) 40 MG tablet TAKE 1 TABLET BY MOUTH EVERY DAY Patient taking differently: Take 40 mg by mouth daily.  08/08/18   Earl Lagos, MD  phenol (CHLORASEPTIC) 1.4 % LIQD Use as directed 2 sprays in the mouth or throat as needed for throat irritation /  pain. 10/30/17   Seawell, Jaimie A, DO  PROAIR HFA 108 (90 Base) MCG/ACT inhaler TAKE 2 PUFFS BY MOUTH EVERY 6 HOURS AS NEEDED FOR WHEEZE OR SHORTNESS OF BREATH Patient taking differently: Inhale 2 puffs into the lungs every 6 (six) hours as needed for wheezing or shortness of breath.  01/30/18   Narendra, Nischal, MD  RESTASIS 0.05 % ophthalmic emulsion PLACE 1 DROP INTO BOTH EYES 2 (TWO) TIMES DAILY. USE ONCE DAILY 08/29/17   Molt, Bethany, DO  rosuvastatin (CRESTOR) 20 MG tablet Take 1 tablet (20 mg total) by mouth daily. 01/30/18   Aldine Contes, MD  warfarin (COUMADIN) 4 MG tablet Take 1/2 tablet on Sundays, Wednesdays and Saturdays. All other days, take one (1) tablet. 10/15/18   Pennie Banter, RPH-CPP    Allergies    Penicillins, Cabbage, Keflex [cephalexin], Shellfish allergy, Tomato, and Latex  Review of Systems   Review of Systems  Gastrointestinal: Positive for abdominal pain.  All other systems reviewed and are negative.   Physical Exam Updated Vital Signs BP (!) 115/59   Pulse 83   Temp 98.6 F (37 C) (Oral)   Resp 16   Ht 5\' 2"   (1.575 m)   Wt 121.2 kg   SpO2 97%   BMI 48.87 kg/m   Physical Exam Vitals and nursing note reviewed.  Constitutional:      Appearance: She is well-developed.  HENT:     Head: Normocephalic and atraumatic.  Cardiovascular:     Rate and Rhythm: Normal rate and regular rhythm.     Heart sounds: No murmur.  Pulmonary:     Effort: Pulmonary effort is normal. No respiratory distress.     Breath sounds: Normal breath sounds.  Abdominal:     Palpations: Abdomen is soft.     Tenderness: There is no guarding or rebound.     Comments: Moderate generalized abdominal tenderness  Musculoskeletal:        General: No swelling or tenderness.  Skin:    General: Skin is warm and dry.  Neurological:     Mental Status: She is alert and oriented to person, place, and time.  Psychiatric:        Behavior: Behavior normal.     ED Results / Procedures / Treatments   Labs (all labs ordered are listed, but only abnormal results are displayed) Labs Reviewed  COMPREHENSIVE METABOLIC PANEL - Abnormal; Notable for the following components:      Result Value   Potassium 3.1 (*)    Chloride 94 (*)    Glucose, Bld 157 (*)    Creatinine, Ser 1.78 (*)    Albumin 2.8 (*)    AST 14 (*)    GFR calc non Af Amer 28 (*)    GFR calc Af Amer 33 (*)    All other components within normal limits  CBC - Abnormal; Notable for the following components:   WBC 13.1 (*)    Hemoglobin 10.3 (*)    HCT 33.4 (*)    MCH 24.7 (*)    All other components within normal limits  PROTIME-INR - Abnormal; Notable for the following components:   Prothrombin Time 68.5 (*)    INR 8.2 (*)    All other components within normal limits  C DIFFICILE QUICK SCREEN W PCR REFLEX  SARS CORONAVIRUS 2 (TAT 6-24 HRS)  LIPASE, BLOOD  LACTIC ACID, PLASMA  URINALYSIS, ROUTINE W REFLEX MICROSCOPIC  LACTIC ACID, PLASMA    EKG None  Radiology  CT Abdomen Pelvis W Contrast  Result Date: 12/31/2018 CLINICAL DATA:  Abdominal pain.  Concern for diverticulitis. EXAM: CT ABDOMEN AND PELVIS WITH CONTRAST TECHNIQUE: Multidetector CT imaging of the abdomen and pelvis was performed using the standard protocol following bolus administration of intravenous contrast. CONTRAST:  75mL OMNIPAQUE IOHEXOL 300 MG/ML  SOLN COMPARISON:  12/07/2018 FINDINGS: Lower chest: Bilateral lower lobe atelectasis. No pleural effusion. Hepatobiliary: No focal liver abnormality is seen. No gallstones, gallbladder wall thickening, or biliary dilatation. Pancreas: Unremarkable. Spleen: Unremarkable. Adrenals/Urinary Tract: Unremarkable adrenal glands. Unchanged 2.1 cm left upper pole renal cyst. Unchanged 2 mm right lower pole renal calculus. No hydronephrosis. Collapsed bladder. Stomach/Bowel: There is a small sliding hiatal hernia. There is no evidence of bowel obstruction. Sigmoid colon diverticulosis is again seen with persistent prominent wall thickening involving the proximal to mid sigmoid colon. Prominent surrounding inflammation has increased with soft tissue posterior, lateral, and inferior to the sigmoid colon suggesting extensive phlegmon. They are is a very small amount of lower density fluid at the posterior aspect of this phlegmon, however a clearly drainable fluid collection is not evident. There is no extraluminal gas. The appendix is unremarkable. Vascular/Lymphatic: Normal caliber of the abdominal aorta with minimal atherosclerotic calcification. Small left lower quadrant lymph nodes, likely reactive. Reproductive: Status post hysterectomy. Increasing phlegmon involves the left adnexa. Unremarkable right adnexa. Other: Small fat containing umbilical hernia. Musculoskeletal: Severe lower lumbar facet arthrosis with grade 1 anterolisthesis of L4 on L5. Severe bilateral neural foraminal stenosis at L4-5 and L5-S1. IMPRESSION: 1. Persistent sigmoid colon diverticulitis with increased surrounding phlegmon but no clearly drainable fluid collection. 2.  Nonobstructing right nephrolithiasis. 3. Aortic Atherosclerosis (ICD10-I70.0). Electronically Signed   By: Sebastian AcheAllen  Grady M.D.   On: 12/31/2018 14:13    Procedures Procedures (including critical care time) CRITICAL CARE Performed by: Tilden FossaElizabeth Akashdeep Chuba   Total critical care time: 35 minutes  Critical care time was exclusive of separately billable procedures and treating other patients.  Critical care was necessary to treat or prevent imminent or life-threatening deterioration.  Critical care was time spent personally by me on the following activities: development of treatment plan with patient and/or surrogate as well as nursing, discussions with consultants, evaluation of patient's response to treatment, examination of patient, obtaining history from patient or surrogate, ordering and performing treatments and interventions, ordering and review of laboratory studies, ordering and review of radiographic studies, pulse oximetry and re-evaluation of patient's condition.  Medications Ordered in ED Medications  ceFEPIme (MAXIPIME) 2 g in sodium chloride 0.9 % 100 mL IVPB (0 g Intravenous Stopped 12/31/18 1549)  metroNIDAZOLE (FLAGYL) IVPB 500 mg (500 mg Intravenous New Bag/Given 12/31/18 1601)  lactated ringers bolus 500 mL (has no administration in time range)    Followed by  lactated ringers 1,000 mL with potassium chloride 30 mEq/L Pediatric IV infusion (has no administration in time range)  HYDROmorphone (DILAUDID) injection 0.5 mg (has no administration in time range)  acetaminophen (OFIRMEV) IV 1,000 mg (has no administration in time range)  acetaminophen (TYLENOL) tablet 650 mg (has no administration in time range)  sodium chloride 0.9 % bolus 1,000 mL (0 mLs Intravenous Stopped 12/31/18 1549)  iohexol (OMNIPAQUE) 300 MG/ML solution 75 mL (75 mLs Intravenous Contrast Given 12/31/18 1344)    ED Course  I have reviewed the triage vital signs and the nursing notes.  Pertinent labs &  imaging results that were available during my care of the patient were reviewed by me and considered in my medical decision  making (see chart for details).    MDM Rules/Calculators/A&P                      Patient with history of diverticulitis here for evaluation of progressive abdominal pain. She also complains of feeling dizzy. She was hypotensive on ED arrival but non-toxic appearing. Labs significant for elevation and INR, leukocytosis. CT abdomen pelvis demonstrates progressive diverticulitis. She was treated with IV fluids, antibiotics. No evidence of active hemorrhage. Discussed with patient findings of studies and recommendation for admission and she is in agreement treatment plan. Medicine consulted for admission. Final Clinical Impression(s) / ED Diagnoses Final diagnoses:  Acute diverticulitis  Elevated INR    Rx / DC Orders ED Discharge Orders    None       Tilden Fossaees, Doneen Ollinger, MD 12/31/18 1611

## 2018-12-31 NOTE — Telephone Encounter (Signed)
Return pt's call - stated diverticulitis has flared-up; c/o intermittent abd pain, loose stools, vomited clear liquid this am, weakness. Stated too weak to come to the office.   Denies fever, sob.cp. Stated she has been sick since Thanksgiving - went to ED on 11/27; given abx's. Explained telehealth appt - pt's agreeable. Telehealth appt schedule today @ 1315 PM; pt informed she will receive a call between 1 - 3 PM and to have her phone close by; and to go to ED if symptoms worsen; pt voiced understanding.

## 2018-12-31 NOTE — Assessment & Plan Note (Addendum)
Patient calling for assessment of left lower quadrant abdominal pain and diarrhea. She was previously seen on 12/9 after being diagnosed with diverticulitis and treated with Flagyl and Cipro. At that time her abdominal pain was improving and she was tolerating PO intake. However on 12/18 she noticed return of her left lower quadrant abdominal pain and diarrhea. Over the weekend her symptoms persisted and she was unable to tolerate PO intake. She now feels very tired/fatigued. She is not had any diarrhea today but has experienced multiple episodes of nonbloody nonbilious emesis. She is not been able to tolerate PO intake. She is prescribed losartan, metformin, and furosemide. She has been taking all his medications as prescribed.  I reviewed her chart she has a history of C diff colitis back in 2018 after being treated with clindamycin.  A/P: - Patient needs to be evaluated for recurrent diverticulitis vs an adequate treatment of her initial diverticulitis vs C diff colitis. - Patient at risk for an AKI given her poor PO intake and continued use of losartan and furosemide. Advised to hold her losartan, metformin, and furosemide - She will need stool studies and basic blood work. She will present to the clinic vs the emergency department to be evaluated.

## 2018-12-31 NOTE — Progress Notes (Signed)
ANTICOAGULATION CONSULT NOTE - Initial Consult  Pharmacy Consult for Warfarin  Indication: HX of recurrent DVT's  Allergies  Allergen Reactions  . Penicillins Anaphylaxis, Hives, Swelling and Rash    Has patient had a PCN reaction causing immediate rash, facial/tongue/throat swelling, SOB or lightheadedness with hypotension: Yes Has patient had a PCN reaction causing severe rash involving mucus membranes or skin necrosis: Yes Has patient had a PCN reaction that required hospitalization Yes Has patient had a PCN reaction occurring within the last 10 years: No If all of the above answers are "NO", then may proceed with Cephalosporin use.   . Cabbage Itching  . Keflex [Cephalexin] Hives    And feeling of throat tightness  . Shellfish Allergy Swelling  . Tomato Swelling  . Latex Itching and Rash    Patient Measurements: Height: 5\' 2"  (157.5 cm) Weight: 267 lb 3.2 oz (121.2 kg) IBW/kg (Calculated) : 50.1 Heparin Dosing Weight:   Vital Signs: Temp: 98.6 F (37 C) (12/21 1203) Temp Source: Oral (12/21 1203) BP: 115/59 (12/21 1545) Pulse Rate: 83 (12/21 1545)  Labs: Recent Labs    12/31/18 1205 12/31/18 1255  HGB 10.3*  --   HCT 33.4*  --   PLT 223  --   LABPROT  --  68.5*  INR  --  8.2*  CREATININE 1.78*  --     Estimated Creatinine Clearance: 35.9 mL/min (A) (by C-G formula based on SCr of 1.78 mg/dL (H)).   Medical History: Past Medical History:  Diagnosis Date  . Arthritis   . Asthma   . Bronchitis   . CHF (congestive heart failure) ([place])   . Diabetes ([place]) 2019  . DVT, lower extremity, recurrent ([place])    Patient had unprovoked PE on 2002 and DVT in right lower extremety 2008.  [name] GERD (gastroesophageal reflux disease)   . History of kidney stones   . Hypertension   . PE (pulmonary embolism)    Patient had unprovoked PE on 2002  . PONV (postoperative nausea and vomiting)   . Vertigo     Medications:  Scheduled:    Assessment: Patient is a 69 yof  that presented to the ED for evaluation of her recurrent Diverticulitis. The patient was recently treated with Cipro/Flagyl for this however is still having abd pain and diarrhea. Possible contributions to her INR currently being elevated at 8.2. Patient has not s/s of bleeding at this time   Goal of Therapy:  INR 2-3 Monitor platelets by anticoagulation protocol: Yes   Plan:  - Will hold Warfarin tonight  - CBC and INR daily for now  - Monitor patient for s/s of bleeding   Duanne Limerick PharmD. BCPS  12/31/2018,4:05 PM

## 2018-12-31 NOTE — Progress Notes (Signed)
   CC: Diarrhea  This is a telephone encounter between Caroline [name] and Caroline [name] on 12/31/2018 for diarrhea. Caroline visit was conducted with Caroline patient located at home and [facility] at [facility]. Caroline patient's identity was confirmed using their DOB and current address. Caroline patient has consented to being evaluated through a telephone encounter and understands Caroline associated risks (an examination cannot be done and Caroline patient may need to come in for an appointment) / benefits (allows Caroline patient to remain at home, decreasing exposure to coronavirus). I personally spent 15 minutes on medical discussion.   HPI:  [name] is a 73 y.o. with PMH as below.   Please see A&P for assessment of Caroline patient's acute and chronic medical conditions.   Past Medical History:  Diagnosis Date  . Arthritis   . Asthma   . Bronchitis   . CHF (congestive heart failure) ([place])   . Diabetes ([place]) 2019  . DVT, lower extremity, recurrent ([place])    Patient had unprovoked PE on 2002 and DVT in right lower extremety 2008.  [name] GERD (gastroesophageal reflux disease)   . History of kidney stones   . Hypertension   . PE (pulmonary embolism)    Patient had unprovoked PE on 2002  . PONV (postoperative nausea and vomiting)   . Vertigo    Review of Systems:  Performed and all others negative.  Assessment & Plan:   See Encounters Tab for problem based charting.  Patient discussed with Dr. Angelia Mould

## 2019-01-01 ENCOUNTER — Encounter (HOSPITAL_COMMUNITY): Payer: Self-pay | Admitting: Internal Medicine

## 2019-01-01 DIAGNOSIS — F329 Major depressive disorder, single episode, unspecified: Secondary | ICD-10-CM

## 2019-01-01 DIAGNOSIS — N179 Acute kidney failure, unspecified: Secondary | ICD-10-CM

## 2019-01-01 DIAGNOSIS — Z86718 Personal history of other venous thrombosis and embolism: Secondary | ICD-10-CM

## 2019-01-01 DIAGNOSIS — R7303 Prediabetes: Secondary | ICD-10-CM

## 2019-01-01 DIAGNOSIS — I110 Hypertensive heart disease with heart failure: Secondary | ICD-10-CM

## 2019-01-01 DIAGNOSIS — Z7901 Long term (current) use of anticoagulants: Secondary | ICD-10-CM

## 2019-01-01 DIAGNOSIS — R791 Abnormal coagulation profile: Secondary | ICD-10-CM

## 2019-01-01 DIAGNOSIS — D649 Anemia, unspecified: Secondary | ICD-10-CM

## 2019-01-01 DIAGNOSIS — E876 Hypokalemia: Secondary | ICD-10-CM

## 2019-01-01 DIAGNOSIS — K5732 Diverticulitis of large intestine without perforation or abscess without bleeding: Principal | ICD-10-CM

## 2019-01-01 DIAGNOSIS — I5089 Other heart failure: Secondary | ICD-10-CM

## 2019-01-01 DIAGNOSIS — Z79899 Other long term (current) drug therapy: Secondary | ICD-10-CM

## 2019-01-01 LAB — COMPREHENSIVE METABOLIC PANEL
ALT: 11 U/L (ref 0–44)
AST: 10 U/L — ABNORMAL LOW (ref 15–41)
Albumin: 2.2 g/dL — ABNORMAL LOW (ref 3.5–5.0)
Alkaline Phosphatase: 45 U/L (ref 38–126)
Anion gap: 15 (ref 5–15)
BUN: 12 mg/dL (ref 8–23)
CO2: 26 mmol/L (ref 22–32)
Calcium: 8.5 mg/dL — ABNORMAL LOW (ref 8.9–10.3)
Chloride: 100 mmol/L (ref 98–111)
Creatinine, Ser: 1.52 mg/dL — ABNORMAL HIGH (ref 0.44–1.00)
GFR calc Af Amer: 40 mL/min — ABNORMAL LOW (ref >60)
GFR calc non Af Amer: 34 mL/min — ABNORMAL LOW (ref >60)
Glucose, Bld: 97 mg/dL (ref 70–99)
Potassium: 2.9 mmol/L — ABNORMAL LOW (ref 3.5–5.1)
Sodium: 141 mmol/L (ref 135–145)
Total Bilirubin: 0.9 mg/dL (ref 0.3–1.2)
Total Protein: 6.2 g/dL — ABNORMAL LOW (ref 6.5–8.1)

## 2019-01-01 LAB — PROTIME-INR
INR: 9.9 (ref 0.8–1.2)
INR: 2.6 — ABNORMAL HIGH (ref 0.8–1.2)
Prothrombin Time: 79.8 seconds — ABNORMAL HIGH (ref 11.4–15.2)
Prothrombin Time: 27.4 seconds — ABNORMAL HIGH (ref 11.4–15.2)

## 2019-01-01 LAB — FERRITIN: Ferritin: 770 ng/mL — ABNORMAL HIGH (ref 11–307)

## 2019-01-01 LAB — CBC
HCT: 29.7 % — ABNORMAL LOW (ref 36.0–46.0)
Hemoglobin: 9 g/dL — ABNORMAL LOW (ref 12.0–15.0)
MCH: 24.6 pg — ABNORMAL LOW (ref 26.0–34.0)
MCHC: 30.3 g/dL (ref 30.0–36.0)
MCV: 81.1 fL (ref 80.0–100.0)
Platelets: 184 10*3/uL (ref 150–400)
RBC: 3.66 MIL/uL — ABNORMAL LOW (ref 3.87–5.11)
RDW: 14.4 % (ref 11.5–15.5)
WBC: 10.6 10*3/uL — ABNORMAL HIGH (ref 4.0–10.5)
nRBC: 0 % (ref 0.0–0.2)

## 2019-01-01 LAB — IRON AND TIBC
Iron: 23 ug/dL — ABNORMAL LOW (ref 28–170)
Saturation Ratios: 15 % (ref 10.4–31.8)
TIBC: 157 ug/dL — ABNORMAL LOW (ref 250–450)
UIBC: 134 ug/dL

## 2019-01-01 LAB — C DIFFICILE QUICK SCREEN W PCR REFLEX
C Diff antigen: NEGATIVE
C Diff interpretation: NOT DETECTED
C Diff toxin: NEGATIVE

## 2019-01-01 LAB — PHOSPHORUS: Phosphorus: 3.1 mg/dL (ref 2.5–4.6)

## 2019-01-01 LAB — MAGNESIUM: Magnesium: 2.2 mg/dL (ref 1.7–2.4)

## 2019-01-01 LAB — POTASSIUM: Potassium: 3 mmol/L — ABNORMAL LOW (ref 3.5–5.1)

## 2019-01-01 MED ORDER — VITAMIN K1 10 MG/ML IJ SOLN
1.0000 mg | Freq: Once | INTRAVENOUS | Status: AC
  Administered 2019-01-01: 1 mg via INTRAVENOUS
  Filled 2019-01-01: qty 0.1

## 2019-01-01 MED ORDER — POTASSIUM CHLORIDE CRYS ER 20 MEQ PO TBCR
40.0000 meq | EXTENDED_RELEASE_TABLET | ORAL | Status: AC
  Administered 2019-01-01 (×2): 40 meq via ORAL
  Filled 2019-01-01 (×2): qty 2

## 2019-01-01 NOTE — Progress Notes (Signed)
   Subjective:  Caroline [name] is a 73 y.o. with PMH of recurrent DVTs on Warfarin, HFmrEF, HTN, MDD, Pre-diabetes admit for recurrent diverticulitis on hospital day 1  Caroline [name] was examined and evaluated at bedside this AM. She was observed resting comfortably in bed. She mentions additional history regarding her presenting symptoms. Mentions she was diagnosed with diverticulitis and treated with 5 day course of oral antibiotics near end of November. She mentions that overnight her symptoms have significantly improved and she is no longer endorsing nausea/vomiting. She does have 5/10 abdominal pain but this is improved compared to yesterday. She mentions that she has not yet had a bowel movement. She states she is willing to try fluids this am. Denies any fevers or chills.  Objective:  Vital signs in last 24 hours: Vitals:   12/31/18 1840 12/31/18 1956 12/31/18 2051 01/01/19 0333  BP: (!) 117/91  (!) 119/57 (!) 108/52  Pulse: 84  82 69  Resp: 20  16 17   Temp: 99.2 F (37.3 C)  99.5 F (37.5 C) 99 F (37.2 C)  TempSrc:    Oral  SpO2: 95% 95% 97% 98%  Weight:      Height:       Gen: Well-developed, Morbidly obese, NAD HEENT: NCAT head, hearing intact, EOMI, MMM CV: RRR, S1, S2 normal, No rubs, no murmurs, no gallops Pulm: CTAB, No rales, no wheezes Abd: Soft, Hypoactive bowel sounds, Minimal tenderness to palpation Extm: ROM intact, Peripheral pulses intact, No peripheral edema, SCDs in place Skin: Dry, Warm, normal turgor, no rashes, lesions, wounds.  Neuro: AAOx3  Assessment/Plan:  Active Problems:   Diverticulitis  Caroline [name] is a 73 y.o. with PMH of recurrent DVTs on Warfarin, HFmrEF, HTN, MDD, Pre-diabetes admit for recurrent diverticulitis. Her symptoms are quickly resolving on IV cefepime and flagyl. Depending on symptoms improvement can transition to oral antibiotics after 2-3 days of IV antibiotics. Has penicillin allergy so will need to take cipro/flagyl, which she  tolerated well in the past. Will likely need 14 day course due to recurrent nature.  Recurrent diverticulitis Present w/ abdominal pain. CT abd showing persistent sigmoid diverticulitis. Day 2 of IV cefepime, flagyl. Wbc 13.1->10.6. Physical exam showing improvement in abdominal  tenderness. Vitals stable. Afebrile. - Trend cbc - C/w IV cefepime, flagyl - Trial of clears - Can switch to oral after 2-3 days of IV abxs  Supratherapeutic INR Recurrent DVTs Normocytic anemia On admission INR noted to be 8.2. On warfarin for recurrent DVTs. Managed by [facility] Coumadin clinic. Overnight elevated to 9.9. No evidence of obvious bleed. Cbc 10.3->9.0. Baseline hemoglobin ~10. Received Fluids in ED and overnight. - Trend CBC - Monitor for bleed - IV Vitamin K 1g this am - Frequent INR monitor - Can give additional vit K if not recovering appropriately - Transfuse if hgb <7  Acute Kidney Injury Hypokalemia/Hypomagnesemia Admit K 3.1, Mag 1.5, BUN 12, Creatinine 1.52. Baseline 0.99. Likely due to dehydration in setting of nausea, vomiting, diarrhea. Am K 2.9, Mag 2.2 - C/w fluid resuscitation - K-dur 54mEqx2 - Trend renal fx - Avoid nephrotoxic meds  HTN Am Bp 108/52. On carvedilol, losartan - Holding home anti-hypertensive due to AKI + soft pressures  DVT prophx: SCDs Diet: Clears Bowel: pantoprazole Code: Full  Dispo: Anticipated discharge in approximately 2-3 day(s).   [name], MD 01/01/2019, 10:22 AM Pager: [phone]

## 2019-01-01 NOTE — Progress Notes (Signed)
[place] for Warfarin  Indication: HX of recurrent DVT's  Patient Measurements: Height: 5\' 2"  (157.5 cm) Weight: 267 lb 3.2 oz (121.2 kg) IBW/kg (Calculated) : 50.1  Vital Signs: Temp: 99 F (37.2 C) (12/22 0333) Temp Source: Oral (12/22 0333) BP: 108/52 (12/22 0333) Pulse Rate: 69 (12/22 0333)  Labs: Recent Labs    12/31/18 1205 12/31/18 1255 01/01/19 0132  HGB 10.3*  --  9.0*  HCT 33.4*  --  29.7*  PLT 223  --  184  LABPROT  --  68.5* 79.8*  INR  --  8.2* 9.9*  CREATININE 1.78*  --  1.52*    Estimated Creatinine Clearance: 42.1 mL/min (A) (by C-G formula based on SCr of 1.52 mg/dL (H)).  Assessment: Patient is a 16 yof that presented to the ED for evaluation of her recurrent Diverticulitis. The patient was recently treated with Cipro/Flagyl for this however is still having abd pain and diarrhea. Possible contributions to her INR currently being elevated at 8.2 upon admission. Patient has not s/s of bleeding at this time.  Today INR remains elevated at 9.9. Hgb is low but stable and platelets are WNL. No bleeding noted.   Goal of Therapy:  INR 2-3 Monitor platelets by anticoagulation protocol: Yes   Plan:  No warfarin tonight Daily INR  Salome Arnt, PharmD, BCPS Clinical Pharmacist Please see AMION for all pharmacy numbers 01/01/2019 6:50 AM

## 2019-01-01 NOTE — Progress Notes (Signed)
CRITICAL VALUE ALERT  Critical Value:  INR = 9.9  Date & Time Notied:  01/01/2019 0255  Provider Notified: Dr. Charleen Kirks  Orders Received/Actions taken: no new orders at this time

## 2019-01-01 NOTE — Progress Notes (Signed)
Internal Medicine Attending Note:  I have seen and evaluated this patient and I have discussed the plan of care with the house staff. Please see their note for complete details. I concur with their findings.  Velna Ochs, MD 01/01/2019, 2:12 PM

## 2019-01-02 LAB — CBC
HCT: 29.3 % — ABNORMAL LOW (ref 36.0–46.0)
Hemoglobin: 8.8 g/dL — ABNORMAL LOW (ref 12.0–15.0)
MCH: 24.2 pg — ABNORMAL LOW (ref 26.0–34.0)
MCHC: 30 g/dL (ref 30.0–36.0)
MCV: 80.5 fL (ref 80.0–100.0)
Platelets: 183 10*3/uL (ref 150–400)
RBC: 3.64 MIL/uL — ABNORMAL LOW (ref 3.87–5.11)
RDW: 14.4 % (ref 11.5–15.5)
WBC: 9.3 10*3/uL (ref 4.0–10.5)
nRBC: 0 % (ref 0.0–0.2)

## 2019-01-02 LAB — BASIC METABOLIC PANEL
Anion gap: 12 (ref 5–15)
BUN: 9 mg/dL (ref 8–23)
CO2: 26 mmol/L (ref 22–32)
Calcium: 8.7 mg/dL — ABNORMAL LOW (ref 8.9–10.3)
Chloride: 106 mmol/L (ref 98–111)
Creatinine, Ser: 1.06 mg/dL — ABNORMAL HIGH (ref 0.44–1.00)
GFR calc Af Amer: >60 mL/min (ref >60)
GFR calc non Af Amer: 53 mL/min — ABNORMAL LOW (ref >60)
Glucose, Bld: 79 mg/dL (ref 70–99)
Potassium: 3.7 mmol/L (ref 3.5–5.1)
Sodium: 144 mmol/L (ref 135–145)

## 2019-01-02 LAB — PROTIME-INR
INR: 2.1 — ABNORMAL HIGH (ref 0.8–1.2)
Prothrombin Time: 23.9 seconds — ABNORMAL HIGH (ref 11.4–15.2)

## 2019-01-02 MED ORDER — WARFARIN SODIUM 4 MG PO TABS
4.0000 mg | ORAL_TABLET | Freq: Once | ORAL | Status: AC
  Administered 2019-01-02: 4 mg via ORAL
  Filled 2019-01-02: qty 1

## 2019-01-02 MED ORDER — POTASSIUM CHLORIDE CRYS ER 20 MEQ PO TBCR
40.0000 meq | EXTENDED_RELEASE_TABLET | Freq: Once | ORAL | Status: AC
  Administered 2019-01-02: 10:00:00 40 meq via ORAL
  Filled 2019-01-02: qty 2

## 2019-01-02 MED ORDER — MECLIZINE HCL 25 MG PO TABS
25.0000 mg | ORAL_TABLET | Freq: Three times a day (TID) | ORAL | Status: DC | PRN
  Administered 2019-01-02 – 2019-01-04 (×3): 25 mg via ORAL
  Filled 2019-01-02 (×4): qty 1

## 2019-01-02 NOTE — Progress Notes (Signed)
[place] for Warfarin  Indication: HX of recurrent DVT's  Patient Measurements: Height: 5\' 2"  (157.5 cm) Weight: 267 lb 3.2 oz (121.2 kg) IBW/kg (Calculated) : 50.1  Vital Signs: Temp: 99.7 F (37.6 C) (12/23 0652) Temp Source: Oral (12/23 0652) BP: 124/61 (12/23 0652) Pulse Rate: 82 (12/23 0737)  Labs: Recent Labs    12/31/18 1205 01/01/19 0132 01/01/19 1850 01/02/19 0309  HGB 10.3* 9.0*  --  8.8*  HCT 33.4* 29.7*  --  29.3*  PLT 223 184  --  183  LABPROT  --  79.8* 27.4* 23.9*  INR  --  9.9* 2.6* 2.1*  CREATININE 1.78* 1.52*  --   --     Estimated Creatinine Clearance: 42.1 mL/min (A) (by C-G formula based on SCr of 1.52 mg/dL (H)).  Assessment: Patient is a 70 yof that presented to the ED for evaluation of her recurrent Diverticulitis. The patient was recently treated with Cipro/Flagyl for this however is still having abd pain and diarrhea. Possible contributions to her INR currently being elevated at 8.2 upon admission. Patient has not s/s of bleeding at this time.  Today INR is down to 2.1 after receiving 1gm IV vitamin K yesterday morning. CBC is stable and no bleeding noted. Pt continues on flagyl which may impact INR.   Goal of Therapy:  INR 2-3 Monitor platelets by anticoagulation protocol: Yes   Plan:  Warfarin 4mg  PO x 1 tonight Daily INR  Salome Arnt, PharmD, BCPS Clinical Pharmacist Please see AMION for all pharmacy numbers 01/02/2019 8:41 AM

## 2019-01-02 NOTE — TOC Initial Note (Signed)
Transition of Care [facility]) - Initial/Assessment Note    Patient Details  Name: Caroline [name] MRN: [id number] Date of Birth: 1945/06/17  [facility]) CM/SW Contact:    Alexander Mt, [place] Phone Number: 01/02/2019, 10:26 AM  Clinical Narrative:                 CSW met with pt at bedside. Introduced self, role, reason for visit. Pt from home with her adult daughter who has ID/D. Confirms home address and PCP information (she is aware she has an appointment in the new year that may get moved up for hospital f/u as needed). She expresses no concern with transportation, medication management or access. She is pleasant and became emotional as she expresses gratitude for the care being provided here. Pt has a cane and rolling walker as well as a bedside commode if she needs it at home. She knows that she has a CSW/RNCM available if she has any additional needs that we can help support.   Expected Discharge Plan: Home/Self Care Barriers to Discharge: Continued Medical Work up   Patient Goals and CMS Choice Patient states their goals for this hospitalization and ongoing recovery are:: to get home- feel better CMS Medicare.gov Compare Post Acute Care list provided to:: (n/a) Choice offered to / list presented to : NA  Expected Discharge Plan and Services Expected Discharge Plan: Home/Self Care In-house Referral: Clinical Social Work Discharge Planning Services: CM Consult   Living arrangements for the past 2 months: Single Family Home  Prior Living Arrangements/Services Living arrangements for the past 2 months: Single Family Home Lives with:: Adult Children Patient language and need for interpreter reviewed:: Yes Do you feel safe going back to the place where you live?: Yes      Need for Family Participation in Patient Care: Yes (Comment)(assistance as needed) Care giver support system in place?: Yes (comment) Current home services: DME Criminal Activity/Legal Involvement  Pertinent to Current Situation/Hospitalization: No - Comment as needed  Activities of Daily Living Home Assistive Devices/Equipment: Cane (specify quad or straight), Walker (specify type) ADL Screening (condition at time of admission) Patient's cognitive ability adequate to safely complete daily activities?: Yes Is the patient deaf or have difficulty hearing?: No Does the patient have difficulty seeing, even when wearing glasses/contacts?: No Does the patient have difficulty concentrating, remembering, or making decisions?: No Patient able to express need for assistance with ADLs?: Yes Does the patient have difficulty dressing or bathing?: No Independently performs ADLs?: Yes (appropriate for developmental age) Does the patient have difficulty walking or climbing stairs?: Yes Weakness of Legs: Both Weakness of Arms/Hands: None  Permission Sought/Granted Permission sought to share information with : PCP Permission granted to share information with : Yes, Verbal Permission Granted     Permission granted to share info w AGENCY: PCP Family Med        Emotional Assessment Appearance:: Appears stated age Attitude/Demeanor/Rapport: Gracious, Engaged Affect (typically observed): Accepting, Adaptable, Appropriate, Pleasant Orientation: : Oriented to Self, Oriented to  Time, Oriented to Place, Oriented to Situation Alcohol / Substance Use: Not Applicable Psych Involvement: No (comment)  Admission diagnosis:  Diverticulitis [K57.92] Elevated INR [R79.1] Acute diverticulitis [K57.92] Patient Active Problem List   Diagnosis Date Noted  . Elevated INR   . Acute diverticulitis 12/20/2018  . Lower abdominal pain 02/20/2018  . GERD (gastroesophageal reflux disease) 01/30/2018  . Hx of bee sting allergy 10/31/2017  . Depression 10/31/2017  . Hematuria, microscopic 09/13/2017  . Asthma,  chronic, mild persistent, uncomplicated 57/33/4483  . Type 2 diabetes mellitus with complication, without  long-term current use of insulin ([place]) 06/02/2017  . Hypokalemia 05/01/2017  . History of Clostridium difficile colitis 12/31/2016  . Anticoagulated on warfarin   . Orthostatic hypotension   . Lipodermatosclerosis 10/11/2016  . Morbid obesity with BMI of 50.0-59.9, adult ([place]) 05/02/2016  . Chronic combined systolic and diastolic CHF, NYHA class 1 ([place]) 09/21/2015  . DVT, lower extremity, recurrent ([place])   . Vertigo 09/13/2011  . Healthcare maintenance 09/13/2011  . Allergic rhinitis 06/21/2006  . Hyperlipidemia 06/10/2006  . Essential hypertension 06/10/2006   PCP:  Aldine Contes, MD Pharmacy:   [id number] [place] [address]Phone: [phone]: [phone]  Readmission Risk Interventions Readmission Risk Prevention Plan 01/02/2019  Transportation Screening Complete  PCP or Specialist Appt within 5-7 Days Complete  Home Care Screening Complete  Medication Review (RN CM) Referral to Pharmacy  Some recent data might be hidden

## 2019-01-02 NOTE — Progress Notes (Signed)
Subjective:  Caroline [name] was seen and evaluated this morning. She states that she has not had any overnight events. She states that she has had some rib pain that is relieved with her tylenol. She denies fevers, vision changes, or diarrhea. She endorses some abdominal pain that is "much better" than when she was admitted. She states that her diarrhea has resolved, and that she had a normal BM that she was able to hand to the nurse for her stool sample. She states that she will try to advance her diet today.    Objective:  Vital signs in last 24 hours: Vitals:   01/01/19 1524 01/01/19 1805 01/01/19 1926 01/01/19 2157  BP: (!) 129/43 (!) 116/45  108/60  Pulse: 83 81  87  Resp: 18 18  18   Temp: 99.3 F (37.4 C) 99.9 F (37.7 C)  99.7 F (37.6 C)  TempSrc: Oral Oral  Oral  SpO2: 98% 98% 98% 96%  Weight:      Height:       CBC Latest Ref Rng & Units 01/02/2019 01/01/2019 12/31/2018  WBC 4.0 - 10.5 K/uL 9.3 10.6(H) 13.1(H)  Hemoglobin 12.0 - 15.0 g/dL 8.8(L) 9.0(L) 10.3(L)  Hematocrit 36.0 - 46.0 % 29.3(L) 29.7(L) 33.4(L)  Platelets 150 - 400 K/uL 183 184 223   Physical Exam Vitals reviewed.  Constitutional:      General: She is not in acute distress.    Appearance: She is well-developed. She is not ill-appearing or toxic-appearing.     Comments: Sitting comfortably in bed. No acute distress.   HENT:     Head: Normocephalic and atraumatic.  Cardiovascular:     Rate and Rhythm: Normal rate and regular rhythm.     Heart sounds: Normal heart sounds. No murmur. No friction rub. No gallop.   Pulmonary:     Effort: Pulmonary effort is normal.     Breath sounds: Normal breath sounds. No wheezing, rhonchi or rales.  Abdominal:     General: Bowel sounds are normal.     Palpations: Abdomen is soft.     Tenderness: There is generalized abdominal tenderness. There is no guarding or rebound.     Comments: Mild generalized tenderness to palpation.   Neurological:     Mental Status: She is  alert and oriented to person, place, and time.  Psychiatric:        Mood and Affect: Mood normal.        Behavior: Behavior normal.     Assessment/Plan:  Active Problems:   Acute diverticulitis   Elevated INR  Caroline [name] is a 73 y.o. with PMH of recurrent DVTs on Warfarin, HFmrEF, HTN, MDD, Pre-diabetes admit for recurrent diverticulitis. Her symptoms are quickly resolving on IV cefepime and flagyl. Depending on symptoms improvement can transition to oral antibiotics after 2-3 days of IV antibiotics. Has penicillin allergy so will need to take cipro/flagyl, which she tolerated well in the past. Will likely need 14 day course due to recurrent nature.  Recurrent Diverticulitis Present w/ abdominal pain. CT abd showing persistent sigmoid diverticulitis. Day 2 of IV cefepime, flagyl. WBC: 9.3. Physical exam showing improvement in abdominal  tenderness. Vitals stable. Afebrile. - Continue trending CBC - Complete IV cefepime and flagyl today.  - Advance diet as tolerated.   Supratherapeutic INR Recurrent DVTs Normocytic anemia On admission INR noted to be 8.2. Currently on home dose warfarin due to [place] of recurrent DVTs, which is managed by the [facility] coumadin clinic. Current Hg:8.8 .  Baseline hemoglobin ~10.  - Morning INR 2.1  - Trend CBC - Trend INR - Monitor for bleed - Can give additional vit K if not recovering appropriately - Transfuse if hgb <7  Acute Kidney Injury Hypokalemia/Hypomagnesemia Admit K 3.1, Mag 1.5, BUN 12, Creatinine 1.52. Baseline 0.99. Likely due to dehydration in setting of nausea, vomiting, diarrhea. Am K 2.9, Mag 2.2 - C/w fluid resuscitation - K-dur Once - Trend renal fx - Avoid nephrotoxic meds  HTN 108/60-129/43 . On carvedilol, losartan - Currently holding Blood pressure medications 2/2 to AKI and low pressures.   DVT prophx: SCDs Diet: Clears advance as tolerated Bowel: pantoprazole Code: Full  Dispo: Anticipated discharge in  approximately 2-3 day(s).   Dolan Amen, MD 01/02/2019, 6:15 AM Pager: [phone]

## 2019-01-02 NOTE — Discharge Instructions (Addendum)
Diverticulitis  Diverticulitis is when small pockets in your large intestine (colon) get infected or swollen. This causes stomach pain and watery poop (diarrhea). These pouches are called diverticula. They form in people who have a condition called diverticulosis. Follow these instructions at home: Medicines  Take over-the-counter and prescription medicines only as told by your doctor. These include: ? Antibiotics. ? Pain medicines. ? Fiber pills. ? Probiotics. ? Stool softeners.  Do not drive or use heavy machinery while taking prescription pain medicine.  If you were prescribed an antibiotic, take it as told. Do not stop taking it even if you feel better. General instructions   Follow a diet as told by your doctor.  When you feel better, your doctor may tell you to change your diet. You may need to eat a lot of fiber. Fiber makes it easier to poop (have bowel movements). Healthy foods with fiber include: ? Berries. ? Beans. ? Lentils. ? Green vegetables.  Exercise 3 or more times a week. Aim for 30 minutes each time. Exercise enough to sweat and make your heart beat faster.  Keep all follow-up visits as told. This is important. You may need to have an exam of the large intestine. This is called a colonoscopy. Contact a doctor if:  Your pain does not get better.  You have a hard time eating or drinking.  You are not pooping like normal. Get help right away if:  Your pain gets worse.  Your problems do not get better.  Your problems get worse very fast.  You have a fever.  You throw up (vomit) more than one time.  You have poop that is: ? Bloody. ? Black. ? Tarry. Summary  Diverticulitis is when small pockets in your large intestine (colon) get infected or swollen.  Take medicines only as told by your doctor.  Follow a diet as told by your doctor. This information is not intended to replace advice given to you by your health care provider. Make sure you  discuss any questions you have with your health care provider. Document Released: 06/15/2007 Document Revised: 12/09/2016 Document Reviewed: 01/14/2016 Elsevier Patient Education  2020 [employer].  Information on my medicine - Coumadin   (Warfarin)  This medication education was reviewed with me or my healthcare representative as part of my discharge preparation. Please hold today's (12/25) dose and begin warfarin 2 mg by mouth daily on 12/26.   Why was Coumadin prescribed for you? Coumadin was prescribed for you because you have a blood clot or a medical condition that can cause an increased risk of forming blood clots. Blood clots can cause serious health problems by blocking the flow of blood to the heart, lung, or brain. Coumadin can prevent harmful blood clots from forming. As a reminder your indication for Coumadin is:   history of recurrent DVTs  What test will check on my response to Coumadin? While on Coumadin (warfarin) you will need to have an INR test regularly to ensure that your dose is keeping you in the desired range. The INR (international normalized ratio) number is calculated from the result of the laboratory test called prothrombin time (PT).  If an INR APPOINTMENT HAS NOT ALREADY BEEN MADE FOR YOU please schedule an appointment to have this lab work done by your health care provider within 7 days. Your INR goal is usually a number between:  2 to 3 or your provider may give you a more narrow range like 2-2.5.  Ask your  health care provider during an office visit what your goal INR is.  What  do you need to  know  About  COUMADIN? Take Coumadin (warfarin) exactly as prescribed by your healthcare provider about the same time each day.  DO NOT stop taking without talking to the doctor who prescribed the medication.  Stopping without other blood clot prevention medication to take the place of Coumadin may increase your risk of developing a new clot or stroke.  Get refills  before you run out.  What do you do if you miss a dose? If you miss a dose, take it as soon as you remember on the same day then continue your regularly scheduled regimen the next day.  Do not take two doses of Coumadin at the same time.  Important Safety Information A possible side effect of Coumadin (Warfarin) is an increased risk of bleeding. You should call your healthcare provider right away if you experience any of the following: ? Bleeding from an injury or your nose that does not stop. ? Unusual colored urine (red or dark brown) or unusual colored stools (red or black). ? Unusual bruising for unknown reasons. ? A serious fall or if you hit your head (even if there is no bleeding).  Some foods or medicines interact with Coumadin (warfarin) and might alter your response to warfarin. To help avoid this: ? Eat a balanced diet, maintaining a consistent amount of Vitamin K. ? Notify your provider about major diet changes you plan to make. ? Avoid alcohol or limit your intake to 1 drink for women and 2 drinks for men per day. (1 drink is 5 oz. wine, 12 oz. beer, or 1.5 oz. liquor.)  Make sure that ANY health care provider who prescribes medication for you knows that you are taking Coumadin (warfarin).  Also make sure the healthcare provider who is monitoring your Coumadin knows when you have started a new medication including herbals and non-prescription products.  Coumadin (Warfarin)  Major Drug Interactions  Increased Warfarin Effect Decreased Warfarin Effect  Alcohol (large quantities) Antibiotics (esp. Septra/Bactrim, Flagyl, Cipro) Amiodarone (Cordarone) Aspirin (ASA) Cimetidine (Tagamet) Megestrol (Megace) NSAIDs (ibuprofen, naproxen, etc.) Piroxicam (Feldene) Propafenone (Rythmol SR) Propranolol (Inderal) Isoniazid (INH) Posaconazole (Noxafil) Barbiturates (Phenobarbital) Carbamazepine (Tegretol) Chlordiazepoxide (Librium) Cholestyramine (Questran) Griseofulvin Oral  Contraceptives Rifampin Sucralfate (Carafate) Vitamin K   Coumadin (Warfarin) Major Herbal Interactions  Increased Warfarin Effect Decreased Warfarin Effect  Garlic Ginseng Ginkgo biloba Coenzyme Q10 Green tea St. John's wort    Coumadin (Warfarin) FOOD Interactions  Eat a consistent number of servings per week of foods HIGH in Vitamin K (1 serving =  cup)  Collards (cooked, or boiled & drained) Kale (cooked, or boiled & drained) Mustard greens (cooked, or boiled & drained) Parsley *serving size only =  cup Spinach (cooked, or boiled & drained) Swiss chard (cooked, or boiled & drained) Turnip greens (cooked, or boiled & drained)  Eat a consistent number of servings per week of foods MEDIUM-HIGH in Vitamin K (1 serving = 1 cup)  Asparagus (cooked, or boiled & drained) Broccoli (cooked, boiled & drained, or raw & chopped) Brussel sprouts (cooked, or boiled & drained) *serving size only =  cup Lettuce, raw (green leaf, endive, romaine) Spinach, raw Turnip greens, raw & chopped   These websites have more information on Coumadin (warfarin):  FailFactory.se; VeganReport.com.au;

## 2019-01-03 LAB — BASIC METABOLIC PANEL
Anion gap: 16 — ABNORMAL HIGH (ref 5–15)
BUN: 9 mg/dL (ref 8–23)
CO2: 23 mmol/L (ref 22–32)
Calcium: 9 mg/dL (ref 8.9–10.3)
Chloride: 106 mmol/L (ref 98–111)
Creatinine, Ser: 0.95 mg/dL (ref 0.44–1.00)
GFR calc Af Amer: >60 mL/min (ref >60)
GFR calc non Af Amer: >60 mL/min (ref >60)
Glucose, Bld: 63 mg/dL — ABNORMAL LOW (ref 70–99)
Potassium: 4.2 mmol/L (ref 3.5–5.1)
Sodium: 145 mmol/L (ref 135–145)

## 2019-01-03 LAB — CBC
HCT: 31.4 % — ABNORMAL LOW (ref 36.0–46.0)
Hemoglobin: 9.5 g/dL — ABNORMAL LOW (ref 12.0–15.0)
MCH: 24.6 pg — ABNORMAL LOW (ref 26.0–34.0)
MCHC: 30.3 g/dL (ref 30.0–36.0)
MCV: 81.3 fL (ref 80.0–100.0)
Platelets: 203 10*3/uL (ref 150–400)
RBC: 3.86 MIL/uL — ABNORMAL LOW (ref 3.87–5.11)
RDW: 14.6 % (ref 11.5–15.5)
WBC: 7.7 10*3/uL (ref 4.0–10.5)
nRBC: 0 % (ref 0.0–0.2)

## 2019-01-03 LAB — PROTIME-INR
INR: 2.2 — ABNORMAL HIGH (ref 0.8–1.2)
Prothrombin Time: 24.1 seconds — ABNORMAL HIGH (ref 11.4–15.2)

## 2019-01-03 MED ORDER — CIPROFLOXACIN HCL 500 MG PO TABS: 500.0000 mg | ORAL_TABLET | Freq: Two times a day (BID) | ORAL | 0 refills | Status: DC

## 2019-01-03 MED ORDER — WARFARIN SODIUM 2 MG PO TABS
2.0000 mg | ORAL_TABLET | Freq: Once | ORAL | Status: AC
  Administered 2019-01-03: 2 mg via ORAL
  Filled 2019-01-03: qty 1

## 2019-01-03 MED ORDER — WARFARIN SODIUM 2 MG PO TABS: 2.0000 mg | ORAL_TABLET | Freq: Once | ORAL | 0 refills | Status: DC

## 2019-01-03 MED ORDER — METRONIDAZOLE 500 MG PO TABS: 500.0000 mg | ORAL_TABLET | Freq: Four times a day (QID) | ORAL | 0 refills | Status: DC

## 2019-01-03 NOTE — Progress Notes (Addendum)
Subjective:  No O/N evnts. Patient was seen on rounds. She is mildly distressed. She states that she continues to have abdominal pain with nausea. Pain is has worsened since yesterday form 5/10 to 8/10 in the abdomen. Counseled the patient regarding transitioning the patient to oral antibiotics if she is able to tolerate PO intake. Encouraged eating. Patient voiced understanding.   Objective:  Vital signs in last 24 hours: Vitals:   01/02/19 0652 01/02/19 0737 01/02/19 1505 01/02/19 2019  BP: 124/61  (!) 142/65   Pulse: 84 82 73   Resp: 18 18 18    Temp: 99.7 F (37.6 C)  98.1 F (36.7 C)   TempSrc: Oral  Axillary   SpO2: 98% 97% 97% 98%  Weight:      Height:       CBC Latest Ref Rng & Units 01/03/2019 01/02/2019 01/01/2019  WBC 4.0 - 10.5 K/uL 7.7 9.3 10.6(H)  Hemoglobin 12.0 - 15.0 g/dL 01/03/2019) 0.2(I) 0.9(B)  Hematocrit 36.0 - 46.0 % 31.4(L) 29.3(L) 29.7(L)  Platelets 150 - 400 K/uL 203 183 184   BMP Latest Ref Rng & Units 01/03/2019 01/02/2019 01/01/2019  Glucose 70 - 99 mg/dL 01/03/2019) 79 -  BUN 8 - 23 mg/dL 9 9 -  Creatinine 29(J - 1.00 mg/dL 2.42 6.83) -  BUN/Creat Ratio 12 - 28 - - -  Sodium 135 - 145 mmol/L 145 144 -  Potassium 3.5 - 5.1 mmol/L 4.2 3.7 3.0(L)  Chloride 98 - 111 mmol/L 106 106 -  CO2 22 - 32 mmol/L 23 26 -  Calcium 8.9 - 10.3 mg/dL 9.0 4.19(Q) -   Physical Exam Vitals and nursing note reviewed.  Constitutional:      General: She is not in acute distress.    Appearance: She is well-developed. She is not ill-appearing or diaphoretic.  HENT:     Head: Normocephalic and atraumatic.  Cardiovascular:     Rate and Rhythm: Normal rate and regular rhythm.     Heart sounds: Normal heart sounds. No murmur. No friction rub. No gallop.   Pulmonary:     Breath sounds: No wheezing, rhonchi or rales.  Abdominal:     Tenderness: There is generalized abdominal tenderness. There is no guarding or rebound.     Comments: Patient with generalized pain on palpation.  No guarding, distension, or rebound.   Skin:    General: Skin is warm.  Neurological:     Mental Status: She is alert and oriented to person, place, and time.   Assessment/Plan:  Active Problems:   Acute diverticulitis   Elevated INR  Caroline [name] is a 73 y.o. with PMH of recurrent DVTs on Warfarin, HFmrEF, HTN, MDD, Pre-diabetes admit for recurrent diverticulitis. Her symptoms quickly resolved on IV cefepime and flagyl. Has penicillin allergy so will need to take cipro/flagyl when transitioning to oral abx, which she tolerated well in the past. Will need 14 day course due to recurrent nature.  Recurrent Diverticulitis Present w/ abdominal pain. CT abd showing persistent sigmoid diverticulitis. Day 4 of IV cefepime, flagyl. WBC: 7.7. Physical exam showing improvement in abdominal  tenderness. Vitals stable. Afebrile. - Continue trending CBC. - Complete IV cefepime and flagyl today. Pending lunch transition to PO antibiotics.  - Can discharge home today if able to tolerate lunch. - Manage nausea with PRN meclizine.   Supratherapeutic INR Recurrent DVTs Normocytic anemia RESOLVED. On admission INR noted to be 8.2. Received 1 dose of Vitamin K. Am INR 2.2 Current Hg:9.5 .  Baseline hemoglobin ~10.  - Trend CBC - Trend INR - Monitor for bleed - F/u with Coumadin clinic  DVT prophx: SCDs Diet: Full Bowel: pantoprazole Code: Full  Dispo: Anticipated discharge in approximately 0-1 day.   [name], MD 01/03/2019, 6:26 AM Pager: [phone]

## 2019-01-03 NOTE — Evaluation (Signed)
Physical Therapy Evaluation Patient Details Name: Caroline [name] MRN: [id number] DOB: [birth year] Today's Date: 01/03/2019   History of Present Illness  Caroline [name] is a 73 y.o. 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  Currently mostly min a for mobility in the room with RW.  States has grandaughter she can rely on for assistance at home.  Recommend follow up HHPT.  Nursing to assist with mobility daily as well (try to use BSC or toilet rather than pure wick during the day).  PT to follow acutely.     Follow Up Recommendations Home health PT    Equipment Recommendations  None recommended by PT    Recommendations for Other Services       Precautions / Restrictions Precautions Precautions: Fall      Mobility  Bed Mobility Overal bed mobility: Needs Assistance Bed Mobility: Sidelying to Sit;Rolling Rolling: Min assist Sidelying to sit: Mod assist       General bed mobility comments: assist for trunk slowly upright, history of vertigo cues for forward gaze  Transfers Overall transfer level: Needs assistance Equipment used: Rolling walker (2 wheeled) Transfers: Sit to/from Stand Sit to Stand: Min assist         General transfer comment: up from EOB with RW (assist to stabilize) and for balance  Ambulation/Gait Ambulation/Gait assistance: Min assist;Min guard Gait Distance (Feet): 20 Feet Assistive device: Rolling walker (2 wheeled) Gait Pattern/deviations: Step-through pattern;Decreased stride length;Wide base of support;Trunk flexed     General Gait Details: no LOB, some increased lateral sway due to girth and wide BOS  Stairs            Wheelchair Mobility    Modified Rankin (Stroke Patients Only)       Balance Overall  balance assessment: Needs assistance   Sitting balance-Leahy Scale: Good     Standing balance support: Bilateral upper extremity supported Standing balance-Leahy Scale: Poor Standing balance comment: UE support for balance                             Pertinent Vitals/Pain Pain Assessment: 0-10 Pain Score: 8  Pain Location: abdominal pain Pain Descriptors / Indicators: Sharp Pain Intervention(s): Monitored during session;Repositioned    Home Living Family/patient expects to be discharged to:: Private residence Living Arrangements: Other relatives(grandaughter and other relatives) Available Help at Discharge: Family;Available 24 hours/day Type of Home: House Home Access: Stairs to enter   [employer] of Steps: 1 Home Layout: One level Home Equipment: Walker - 2 wheels      Prior Function Level of Independence: Independent with assistive device(s)         Comments: uses RW, started having difficulty with bathing due to illness, grandaughter had to help, normally sponge bathes     Hand Dominance        Extremity/Trunk Assessment   Upper Extremity Assessment Upper Extremity Assessment: Generalized weakness    Lower Extremity Assessment Lower Extremity Assessment: Generalized weakness       Communication   Communication: No difficulties  Cognition Arousal/Alertness: Awake/alert Behavior During Therapy: WFL for tasks assessed/performed Overall Cognitive Status: Within Functional Limits for tasks assessed  General Comments General comments (skin integrity, edema, etc.): needed encouragement to participate in OOB activity    Exercises     Assessment/Plan    PT Assessment Patient needs continued PT services  PT Problem List Decreased strength;Decreased activity tolerance;Decreased mobility;Decreased knowledge of use of DME;Decreased balance;Decreased safety awareness       PT  Treatment Interventions DME instruction;Therapeutic activities;Stair training;Balance training;Functional mobility training;Gait training;Therapeutic exercise;Patient/family education    PT Goals (Current goals can be found in the Care Plan section)  Acute Rehab PT Goals Patient Stated Goal: to go home instead of to nursing home PT Goal Formulation: With patient Time For Goal Achievement: 01/17/19 Potential to Achieve Goals: Good    Frequency Min 3X/week   Barriers to discharge        Co-evaluation               AM-PAC PT "6 Clicks" Mobility  Outcome Measure Help needed turning from your back to your side while in a flat bed without using bedrails?: A Little Help needed moving from lying on your back to sitting on the side of a flat bed without using bedrails?: A Lot Help needed moving to and from a bed to a chair (including a wheelchair)?: A Little Help needed standing up from a chair using your arms (e.g., wheelchair or bedside chair)?: A Little Help needed to walk in hospital room?: A Little Help needed climbing 3-5 steps with a railing? : A Lot 6 Click Score: 16    End of Session Equipment Utilized During Treatment: Gait belt Activity Tolerance: Patient tolerated treatment well Patient left: in chair;with call bell/phone within reach   PT Visit Diagnosis: Other abnormalities of gait and mobility (R26.89);Muscle weakness (generalized) (M62.81)    Time: 4193-7902 PT Time Calculation (min) (ACUTE ONLY): 38 min   Charges:   PT Evaluation $PT Eval Moderate Complexity: 1 Mod PT Treatments $Gait Training: 8-22 mins        Magda Kiel, [place] Acute Rehabilitation Services [phone] 01/03/2019\  Reginia Naas 01/03/2019, 5:37 PM

## 2019-01-03 NOTE — Progress Notes (Signed)
Paged by nursing staff regarding Caroline [name]'s concerns regarding discharge. She mentions that she still feels very weak and she has not yet eaten much food. She mentions that she feels she would feel unsafe to be discharged home and she needs to stay in the hospital to recover. She mentions that she has no one to take care of her at home except for her granddaughter who has ADHD and is incapable of managing her. Discussed in detail regarding her hospitalization and clinical course and reassured that she has had improvement in her clinical course. Caroline [name] expressed understanding. Will get PT to evaluate for deconditioning and discharge as appropriate.

## 2019-01-03 NOTE — Progress Notes (Signed)
[place] for Warfarin  Indication: HX of recurrent DVT's  Patient Measurements: Height: 5\' 2"  (157.5 cm) Weight: 267 lb 3.2 oz (121.2 kg) IBW/kg (Calculated) : 50.1  Vital Signs: Temp: 99.2 F (37.3 C) (12/24 0647) Temp Source: Oral (12/24 0647) BP: 144/68 (12/24 0647) Pulse Rate: 70 (12/24 0802)  Labs: Recent Labs    01/01/19 0132 01/01/19 1850 01/02/19 0309 01/02/19 0745 01/03/19 0327  HGB 9.0*  --  8.8*  --  9.5*  HCT 29.7*  --  29.3*  --  31.4*  PLT 184  --  183  --  203  LABPROT 79.8* 27.4* 23.9*  --  24.1*  INR 9.9* 2.6* 2.1*  --  2.2*  CREATININE 1.52*  --   --  1.06* 0.95    Estimated Creatinine Clearance: 67.3 mL/min (by C-G formula based on SCr of 0.95 mg/dL).  Assessment: Patient is a 11 yof that presented to the ED for evaluation of her recurrent Diverticulitis. The patient was recently treated with Cipro/Flagyl for this however is still having abd pain and diarrhea. Possible contributions to her INR currently being elevated at 8.2 upon admission. Patient has no s/s of bleeding at this time.  PTA warfarin regimen 2mg  TTSa &  4mg  MWFSu  Today INR is 2.2 which is up from 2.1 yesterday. Pt received 1gm IV vitamin K on 12/22. CBC is stable and no bleeding noted. Pt continues on flagyl which may impact INR.   Goal of Therapy:  INR 2-3 Monitor platelets by anticoagulation protocol: Yes   Plan:  Warfarin 2mg  PO x 1 tonight Daily INR and CBC  Kennon Holter, PharmD PGY1 Ambulatory Care Pharmacy Resident Please see AMION for all pharmacy numbers 01/03/2019 8:36 AM

## 2019-01-03 NOTE — Care Management Important Message (Signed)
Important Message  Patient Details  Name: Caroline [name] MRN: [id number] Date of Birth: 12-29-1945   Medicare Important Message Given:  Yes     [name] [name] 01/03/2019, 11:29 AM

## 2019-01-03 NOTE — Progress Notes (Signed)
Internal Medicine Attending Note:  I have seen and evaluated this patient and I have discussed the plan of care with the house staff. Please see their note for complete details. I concur with their findings.   Velna Ochs, MD 01/03/2019, 2:37 PM

## 2019-01-04 LAB — CBC
HCT: 29.8 % — ABNORMAL LOW (ref 36.0–46.0)
Hemoglobin: 9.2 g/dL — ABNORMAL LOW (ref 12.0–15.0)
MCH: 24.3 pg — ABNORMAL LOW (ref 26.0–34.0)
MCHC: 30.9 g/dL (ref 30.0–36.0)
MCV: 78.6 fL — ABNORMAL LOW (ref 80.0–100.0)
Platelets: 203 10*3/uL (ref 150–400)
RBC: 3.79 MIL/uL — ABNORMAL LOW (ref 3.87–5.11)
RDW: 14.4 % (ref 11.5–15.5)
WBC: 7.7 10*3/uL (ref 4.0–10.5)
nRBC: 0 % (ref 0.0–0.2)

## 2019-01-04 LAB — PROTIME-INR
INR: 3.3 — ABNORMAL HIGH (ref 0.8–1.2)
Prothrombin Time: 33.3 seconds — ABNORMAL HIGH (ref 11.4–15.2)

## 2019-01-04 MED ORDER — WARFARIN SODIUM 2 MG PO TABS: 2.0000 mg | ORAL_TABLET | Freq: Every day | ORAL | 0 refills | Status: DC

## 2019-01-04 MED ORDER — METRONIDAZOLE 500 MG PO TABS: 500.0000 mg | ORAL_TABLET | Freq: Three times a day (TID) | ORAL | 0 refills | Status: AC

## 2019-01-04 MED ORDER — CEFIXIME 400 MG PO CAPS: 400.0000 mg | ORAL_CAPSULE | Freq: Every day | ORAL | 0 refills | Status: AC

## 2019-01-04 NOTE — TOC Transition Note (Addendum)
Transition of Care [facility]) - CM/SW Discharge Note   Patient Details  Name: Caroline [name] MRN: [id number] Date of Birth: 1945/06/17  [facility] of Care [facility] CM/SW Contact:  Maryclare Labrador, RN Phone Number: 01/04/2019, 8:26 AM   Clinical Narrative:   Pt is stable to discharge home.  Pt confirms family will pick her up at discharge.  Pt is interested in [facility] as ordered - CM provided medicare.gov HH choice - pt chose [facility] - agency contacted.  Referral under review   Update:  [facility] will not accept pt.  Pt did not have another preference.  CM exhausted medicare.gov HH list   Declines for [facility]  Closed for [employer]   Pt was accepted by [facility] .  Delay in start of care until early next week.  Attending aware and in agreement.      Final next level of care: [place] Barriers to Discharge: Barriers Resolved   Patient Goals and CMS Choice Patient states their goals for this hospitalization and ongoing recovery are:: Pt is ready to get back to her home and enjoy the holidays CMS Medicare.gov Compare Post Acute Care list provided to:: Patient Choice offered to / list presented to : Patient  Discharge Placement                       Discharge Plan and Services In-house Referral: Clinical Social Work Discharge Planning Services: CM Consult                      HH Arranged: PT [facility] Agency: [facility] Date [place]: 01/04/19 Time [place]: [phone]    Social Determinants of Health ([place]) Interventions     Readmission Risk Interventions Readmission Risk Prevention Plan 01/02/2019  Transportation Screening Complete  PCP or Specialist Appt within 5-7 Days Complete  Home Care Screening Complete  Medication Review (RN CM) Referral to Pharmacy  Some recent data might be hidden

## 2019-01-04 NOTE — Progress Notes (Signed)
Physical Therapy Treatment Patient Details Name: Caroline [name] MRN: [id number] DOB: Oct 15, 1945 Today's Date: 01/04/2019    History of Present Illness Caroline [name] is a 73 y.o. 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 Pt requires min guard for functional transfers and gait training this session. Pt tolerated gait distance of 20 ft then 25 ft after seated rest break. Pt will continue to benefit from further skilled PT services to maximize independence and safety with mobility.     Follow Up Recommendations  Home health PT     Equipment Recommendations  Other (comment);3in1 (PT)(wide/bari )    Recommendations for Other Services       Precautions / Restrictions Precautions Precautions: Fall Restrictions Weight Bearing Restrictions: No    Mobility  Bed Mobility Overal bed mobility: Modified Independent Bed Mobility: Supine to Sit           General bed mobility comments: increased time and effort; use of rail and HOB elevated slightly; pt reports sleeping in recliner at home  Transfers Overall transfer level: Needs assistance Equipment used: Rolling walker (2 wheeled) Transfers: Sit to/from Stand Sit to Stand: Min guard         General transfer comment: min guard for safety  Ambulation/Gait Ambulation/Gait assistance: Min guard Gait Distance (Feet): (20 ft, seated rest break, then 25 ft) Assistive device: Rolling walker (2 wheeled) Gait Pattern/deviations: Step-through pattern;Decreased stride length;Wide base of support;Trunk flexed     General Gait Details: slow, steady gait; pt c/o SOB and feeling that her legs are swollen    Stairs             Wheelchair Mobility    Modified Rankin (Stroke Patients Only)       Balance Overall balance assessment: Needs assistance   Sitting balance-Leahy Scale: Good     Standing balance  support: Bilateral upper extremity supported Standing balance-Leahy Scale: Poor                              Cognition Arousal/Alertness: Awake/alert Behavior During Therapy: WFL for tasks assessed/performed Overall Cognitive Status: Within Functional Limits for tasks assessed                                        Exercises      General Comments        Pertinent Vitals/Pain Pain Assessment: Faces Faces Pain Scale: Hurts little more Pain Location: abdominal pain Pain Descriptors / Indicators: Sharp;Grimacing;Guarding;Discomfort Pain Intervention(s): Monitored during session    Home Living                      Prior Function            PT Goals (current goals can now be found in the care plan section) Progress towards PT goals: Progressing toward goals    Frequency    Min 3X/week      PT Plan Current plan remains appropriate    Co-evaluation              AM-PAC PT "6 Clicks" Mobility   Outcome Measure  Help needed turning from your back to your side while in a flat bed without using bedrails?: A Little Help needed  moving from lying on your back to sitting on the side of a flat bed without using bedrails?: A Lot Help needed moving to and from a bed to a chair (including a wheelchair)?: A Little Help needed standing up from a chair using your arms (e.g., wheelchair or bedside chair)?: A Little Help needed to walk in hospital room?: A Little Help needed climbing 3-5 steps with a railing? : A Lot 6 Click Score: 16    End of Session Equipment Utilized During Treatment: Gait belt Activity Tolerance: Patient tolerated treatment well Patient left: in chair;with call bell/phone within reach Nurse Communication: Mobility status PT Visit Diagnosis: Other abnormalities of gait and mobility (R26.89);Muscle weakness (generalized) (M62.81)     Time: 1540-0867 PT Time Calculation (min) (ACUTE ONLY): 28 min  Charges:  $Gait  Training: 23-37 mins                     Erline Levine, PTA Acute Rehabilitation Services Pager: [phone] Office: [phone]     [name] 01/04/2019, 12:16 PM

## 2019-01-04 NOTE — Discharge Summary (Signed)
Name: Caroline [name] MRN: [id number] DOB: 09/11/1945 73 y.o. PCP: Earl Lagos, MD  Date of Admission: 12/31/2018 11:59 AM Date of Discharge:  Attending Physician: Reymundo Poll, MD  Discharge Diagnosis: 1. Recurrent Complicated Diverticulitis 2. Supratherapeutic INR 3. Hypokalemia  Discharge Medications: Allergies as of 01/04/2019      Reactions   Penicillins Anaphylaxis, Hives, Swelling, Rash   Has patient had a PCN reaction causing immediate rash, facial/tongue/throat swelling, SOB or lightheadedness with hypotension: Yes Has patient had a PCN reaction causing severe rash involving mucus membranes or skin necrosis: Yes Has patient had a PCN reaction that required hospitalization Yes Has patient had a PCN reaction occurring within the last 10 years: No If all of the above answers are "NO", then may proceed with Cephalosporin use.   Cabbage Itching   Keflex [cephalexin] Hives   And feeling of throat tightness   Shellfish Allergy Swelling   Tomato Swelling   Latex Itching, Rash      Medication List    STOP taking these medications   furosemide 40 MG tablet Commonly known as: LASIX   losartan 100 MG tablet Commonly known as: COZAAR   magnesium oxide 400 MG tablet Commonly known as: MAG-OX     TAKE these medications   budesonide-formoterol 160-4.5 MCG/ACT inhaler Commonly known as: SYMBICORT Inhale 2 puffs into the lungs 2 (two) times daily.   calcium citrate-vitamin D 315-200 MG-UNIT tablet Commonly known as: CITRACAL+D TAKE 2 TABLETS DAILY BY MOUTH. What changed: See the new instructions.   carvedilol 12.5 MG tablet Commonly known as: COREG TAKE 1 TABLET BY MOUTH 2 TIMES DAILY.   cefixime 400 MG Caps capsule Commonly known as: SUPRAX Take 1 capsule (400 mg total) by mouth daily for 9 days.   EPINEPHrine 0.3 mg/0.3 mL Soaj injection Commonly known as: EPI-PEN Inject 0.3 mg into the muscle as needed. Severe allergic reactions   fluticasone 50  MCG/ACT nasal spray Commonly known as: FLONASE PLACE 2 SPRAYS DAILY INTO BOTH NOSTRILS. What changed: See the new instructions.   HYDROcodone-acetaminophen 5-325 MG tablet Commonly known as: Norco Take 1 tablet by mouth every 6 (six) hours as needed for severe pain (may cause constipation).   hydrocortisone 2.5 % rectal cream Commonly known as: Proctosol HC Place 1 application rectally 2 (two) times daily.   meclizine 25 MG tablet Commonly known as: ANTIVERT TAKE 1 TABLET BY MOUTH THREE TIMES A DAY AS NEEDED What changed: reasons to take this   metFORMIN 500 MG 24 hr tablet Commonly known as: GLUCOPHAGE-XR TAKE 1 TABLET BY MOUTH EVERY DAY WITH BREAKFAST What changed: See the new instructions.   metroNIDAZOLE 500 MG tablet Commonly known as: Flagyl Take 1 tablet (500 mg total) by mouth 3 (three) times daily for 9 days.   montelukast 10 MG tablet Commonly known as: SINGULAIR Take 1 tablet (10 mg total) by mouth at bedtime.   pantoprazole 40 MG tablet Commonly known as: PROTONIX TAKE 1 TABLET BY MOUTH EVERY DAY   phenol 1.4 % Liqd Commonly known as: CHLORASEPTIC Use as directed 2 sprays in the mouth or throat as needed for throat irritation / pain.   ProAir HFA 108 (90 Base) MCG/ACT inhaler Generic drug: albuterol TAKE 2 PUFFS BY MOUTH EVERY 6 HOURS AS NEEDED FOR WHEEZE OR SHORTNESS OF BREATH What changed: See the new instructions.   Restasis 0.05 % ophthalmic emulsion Generic drug: cycloSPORINE PLACE 1 DROP INTO BOTH EYES 2 (TWO) TIMES DAILY. USE ONCE DAILY What changed: See  the new instructions.   rosuvastatin 20 MG tablet Commonly known as: CRESTOR Take 1 tablet (20 mg total) by mouth daily.   warfarin 2 MG tablet Commonly known as: COUMADIN Take as directed. If you are unsure how to take this medication, talk to your nurse or doctor. Original instructions: Take 1 tablet (2 mg total) by mouth daily at 6 PM. What changed:   medication strength  how much to  take  how to take this  when to take this  additional instructions       Disposition and follow-up:   [name] was discharged from [facility] in Stable condition.  At the hospital follow up visit please address:  1.  Recurrent Diverticulitis - Has hx of recurrent diverticulitis - Please ensure she finishes 14 day course of abx (end-date 01/13/18) - Have her restart her home anti-hypertensive as appropriate as she was hypotensive on admission. - Need f/u colonoscopy 6 weeks post discharge  2. Supratherapeutic INR: - She is on warfarin for recurrent DVT - Fluctuating INR in setting of flagyl. Please check INR and adjust warfarin dose as needed  3. Hypokalemia - Check bmp to ensure stable potassium level  2.  Labs / imaging needed at time of follow-up: cbc, bmp, INR  3.  Pending labs/ test needing follow-up: N/A  Follow-up Appointments: Follow-up Information    Aldine Contes, MD. Go on 01/08/2019.   Specialty: Internal Medicine Why: We will call you to confirm appointment Contact information: [address], [address] [place] [address] [phone]        Constance Haw, MD .   Specialty: Cardiology Contact information: [address] [place] [address] [place] [address] [phone]          Hospital Course by problem list: 1. Recurrent Complicated Diverticulitis:  [name] is a 73 y.o. yo female w/ PMH significant for Asthma, Diverticulitis, DVT's recurrent, HFmrEF, HTN, MDD who presented to [facility] w/ complaints of abdominal pain, nausea, vomiting and diarrhea. She had history of recurrent diverticulitis treated with 7 day course of antibiotics recently. She was found to have elevated leukocytosis, hypotension and worsening sigmoid colon diverticulitis with increased surrounding phlegmon on abdominal CT. She was admitted for IV abx therapy for cefepime and flagyl. Her leukocytosis resolved after 4 days and she was discharged on  oral antibiotics to complete 14 day course.  2. Supratherapeutic INR: On admission noted to have INR >8. Presumed to be due to poor oral intake. Vitamin K was administered and her INR level was brought down to therapeutic level. INR remained stable throughout the hospital course despite being on IV metronidazole. Discharged w/ recommendation to f/u with PCP & Coumadin Clinic.  3. Hypokalemia: On admission noted to have hypokalemia with K of 3.1. Replete via IV route. Able to tolerate oral intake after 3 days of IV antibiotics. Discharged w/ recommendation to f/u with PCP.  Discharge Vitals:   BP (!) 134/54 (BP Location: Left Arm)   Pulse 77   Temp 98.6 F (37 C) (Oral)   Resp 16   Ht 5\' 2"  (1.575 m)   Wt 121.2 kg   SpO2 98%   BMI 48.87 kg/m   Pertinent Labs, Studies, and Procedures:  CBC Latest Ref Rng & Units 01/04/2019 01/03/2019 01/02/2019  WBC 4.0 - 10.5 K/uL 7.7 7.7 9.3  Hemoglobin 12.0 - 15.0 g/dL 9.2(L) 9.5(L) 8.8(L)  Hematocrit 36.0 - 46.0 % 29.8(L) 31.4(L) 29.3(L)  Platelets 150 - 400 K/uL  203 203 183   BMP Latest Ref Rng & Units 01/03/2019 01/02/2019 01/01/2019  Glucose 70 - 99 mg/dL 01(U) 79 -  BUN 8 - 23 mg/dL 9 9 -  Creatinine 2.72 - 1.00 mg/dL 5.36 6.44(I) -  BUN/Creat Ratio 12 - 28 - - -  Sodium 135 - 145 mmol/L 145 144 -  Potassium 3.5 - 5.1 mmol/L 4.2 3.7 3.0(L)  Chloride 98 - 111 mmol/L 106 106 -  CO2 22 - 32 mmol/L 23 26 -  Calcium 8.9 - 10.3 mg/dL 9.0 3.4(V) -   Discharge Instructions: Discharge Instructions    Call MD for:  difficulty breathing, headache or visual disturbances   Complete by: As directed    Call MD for:  persistant nausea and vomiting   Complete by: As directed    Call MD for:  persistant nausea and vomiting   Complete by: As directed    Call MD for:  redness, tenderness, or signs of infection (pain, swelling, redness, odor or green/yellow discharge around incision site)   Complete by: As directed    Call MD for:  redness, tenderness,  or signs of infection (pain, swelling, redness, odor or green/yellow discharge around incision site)   Complete by: As directed    Call MD for:  severe uncontrolled pain   Complete by: As directed    Call MD for:  severe uncontrolled pain   Complete by: As directed    Call MD for:  temperature >100.4   Complete by: As directed    Diet - low sodium heart healthy   Complete by: As directed    Diet - low sodium heart healthy   Complete by: As directed    Discharge instructions   Complete by: As directed    Dear [name]  You came to [place] with abdominal pain. We have determined this was caused by diverticulitis. Here are our recommendations for you at discharge:  Please take cefixime 400mg  daily for 9 days to complete 14 day course Please take metronidazole 500mg  3 times daily for 9 days to complete 14 day course Please take HALF of your normal Warfarin dose Please follow up with [facility] clinic early next week.  Thank you for choosing [facility].   Increase activity slowly   Complete by: As directed    Increase activity slowly   Complete by: As directed    Increase activity slowly   Complete by: As directed      Signed: , MD 01/04/2019, 10:34 AM   Pager: [phone]

## 2019-01-04 NOTE — Progress Notes (Signed)
Caroline [name] for Warfarin  Indication: HX of recurrent DVT's  Patient Measurements: Height: 5\' 2"  (157.5 cm) Weight: 267 lb 3.2 oz (121.2 kg) IBW/kg (Calculated) : 50.1  Vital Signs: Temp: 98.6 F (37 C) (12/25 0623) Temp Source: Oral (12/25 0623) BP: 134/54 (12/25 0623) Pulse Rate: 77 (12/25 0623)  Labs: Recent Labs    01/02/19 0309 01/02/19 0745 01/03/19 0327 01/04/19 0208  HGB 8.8*  --  9.5* 9.2*  HCT 29.3*  --  31.4* 29.8*  PLT 183  --  203 203  LABPROT 23.9*  --  24.1* 33.3*  INR 2.1*  --  2.2* 3.3*  CREATININE  --  1.06* 0.95  --     Estimated Creatinine Clearance: 67.3 mL/min (by C-G formula based on SCr of 0.95 mg/dL).  Assessment: Patient is a 43 yof that presented to the ED for evaluation of her recurrent Diverticulitis. The patient was recently treated with Cipro/Flagyl for this however is still having abd pain and diarrhea. Possible contributions to her INR being elevated at 8.2 upon admission. Pt received 1gm IV vitamin K on 12/22. Pharmacy consulted to dose warfarin.  PTA warfarin regimen 2mg  TTSa &  4mg  MWFSu  Today INR is supratherapeutic at 3.3, up from 2.2 yesterday. CBC is stable and no bleeding noted. Pt continues on flagyl which can elevate INR. Plan to discharge home today on cefixime and Flagyl to complete 14 days of antibiotics.  Goal of Therapy:  INR 2-3 Monitor platelets by anticoagulation protocol: Yes   Plan:  Hold warfarin dose tonight due to elevated INR Start warfarin 2 mg PO daily on 12/26 Pt to follow up at [facility] early next week for INR and CBC check   Caroline [name], PharmD PGY1 Pharmacy Resident  Please check AMION for all [facility] phone numbers After 10:00 PM, call [place] [phone] 01/04/2019 10:23 AM

## 2019-01-04 NOTE — Progress Notes (Signed)
Patient discharged to home with instructions. 

## 2019-01-04 NOTE — Progress Notes (Signed)
   Subjective:  Caroline [name] is a 73 y.o. 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 She states she has significant improvement in her abdominal pain and she was able to tolerate breakfast without nausea or vomiting. Discussed in detail discharge plan with early f/u with [facility] clinic. [name] expressed understanding.  Objective:  Vital signs in last 24 hours: Vitals:   01/03/19 0802 01/03/19 1527 01/03/19 2035 01/03/19 2152  BP:  (!) 155/75  117/78  Pulse: 70 78  81  Resp: 18 18  15   Temp:  98.6 F (37 C)  98.2 F (36.8 C)  TempSrc:  Axillary  Oral  SpO2: 94% 98% 98% 98%  Weight:      Height:       Gen: Well-developed, well nourished, NAD HEENT: NCAT head, hearing intact, EOMI, PERRL Neck: supple, ROM intact, no JVD CV: RRR, S1, S2 normal, No rubs, no murmurs, no gallops Pulm: CTAB, No rales, no wheezes Abd: Soft, BS+, NTND Extm: ROM intact, Peripheral pulses intact, No peripheral edema  Assessment/Plan:  Active Problems:   Acute diverticulitis   Elevated INR  Caroline [name] is a 73 y.o. with PMH of recurrent DVTs on Warfarin, HFmrEF, HTN, MDD, Pre-diabetes admit for recurrent diverticulitis. She is able to tolerate oral intake and her symptoms have resolved requiring minimal pain medications. PT recommend home health. Stable for discharge today.  Recurrent Diverticulitis Caroline [name] 13.1->7.7. Day 5 of IV cefepime, flagyl. Physical exam w/  Minimal tenderness. - Discharge today on cefixime and flagyl to complete 14 days of total antibiotics  Recurrent DVTs on warfarin INR 2.2->3.3. No evidence of bleed on exam. - She is on Warfarin for DVT prevention. - Fluctuating INR in setting of flagyl administration. - Will need close f/u outpatient to monitor INR and adjust Warfarin dose as needed  DVT prophx: Warfarin Diet: Full Bowel: N/A Code:  Full  Dispo: Anticipated discharge in approximately today.   [name], MD 01/04/2019, 6:22 AM Pager: [phone]

## 2019-01-07 DIAGNOSIS — Z86718 Personal history of other venous thrombosis and embolism: Secondary | ICD-10-CM | POA: Diagnosis not present

## 2019-01-07 DIAGNOSIS — K5732 Diverticulitis of large intestine without perforation or abscess without bleeding: Secondary | ICD-10-CM | POA: Diagnosis not present

## 2019-01-07 DIAGNOSIS — E876 Hypokalemia: Secondary | ICD-10-CM | POA: Diagnosis not present

## 2019-01-07 DIAGNOSIS — E119 Type 2 diabetes mellitus without complications: Secondary | ICD-10-CM | POA: Diagnosis not present

## 2019-01-07 DIAGNOSIS — I110 Hypertensive heart disease with heart failure: Secondary | ICD-10-CM | POA: Diagnosis not present

## 2019-01-07 DIAGNOSIS — Z9181 History of falling: Secondary | ICD-10-CM | POA: Diagnosis not present

## 2019-01-07 DIAGNOSIS — Z7984 Long term (current) use of oral hypoglycemic drugs: Secondary | ICD-10-CM | POA: Diagnosis not present

## 2019-01-07 DIAGNOSIS — J45909 Unspecified asthma, uncomplicated: Secondary | ICD-10-CM | POA: Diagnosis not present

## 2019-01-07 DIAGNOSIS — I5020 Unspecified systolic (congestive) heart failure: Secondary | ICD-10-CM | POA: Diagnosis not present

## 2019-01-07 DIAGNOSIS — Z7901 Long term (current) use of anticoagulants: Secondary | ICD-10-CM | POA: Diagnosis not present

## 2019-01-08 ENCOUNTER — Telehealth: Payer: Self-pay | Admitting: Internal Medicine

## 2019-01-08 NOTE — Telephone Encounter (Signed)
Per Amy at [facility] needs to speak to nurse [phone]

## 2019-01-09 ENCOUNTER — Other Ambulatory Visit: Payer: Self-pay

## 2019-01-09 ENCOUNTER — Encounter: Payer: Self-pay | Admitting: Internal Medicine

## 2019-01-09 ENCOUNTER — Ambulatory Visit: Payer: Medicare Other

## 2019-01-09 ENCOUNTER — Ambulatory Visit (INDEPENDENT_AMBULATORY_CARE_PROVIDER_SITE_OTHER): Payer: Medicare Other | Admitting: Internal Medicine

## 2019-01-09 VITALS — BP 130/54 | HR 69 | Temp 98.7°F | Wt 263.7 lb

## 2019-01-09 DIAGNOSIS — Z87891 Personal history of nicotine dependence: Secondary | ICD-10-CM | POA: Diagnosis not present

## 2019-01-09 DIAGNOSIS — Z8719 Personal history of other diseases of the digestive system: Secondary | ICD-10-CM | POA: Diagnosis not present

## 2019-01-09 DIAGNOSIS — Z09 Encounter for follow-up examination after completed treatment for conditions other than malignant neoplasm: Secondary | ICD-10-CM | POA: Diagnosis not present

## 2019-01-09 DIAGNOSIS — K5792 Diverticulitis of intestine, part unspecified, without perforation or abscess without bleeding: Secondary | ICD-10-CM

## 2019-01-09 DIAGNOSIS — R791 Abnormal coagulation profile: Secondary | ICD-10-CM

## 2019-01-09 MED ORDER — PSYLLIUM 95 % PO PACK: 1.0000 | PACK | Freq: Every day | ORAL | 0 refills | Status: AC

## 2019-01-09 NOTE — Patient Instructions (Signed)
Caroline [name] I have prescribed metamucil to help keep your bowels soft and regular please take this at night at least 12 hours apart from your other medications.  I have also referred you to a gastroenterologist for colonoscopy which is recommended given your diverticulitis.

## 2019-01-09 NOTE — Progress Notes (Signed)
CC: HFU diverticulitis  HPI:  Caroline [name] is a 73 y.o. female with PMH below.  Today we will address HFU diverticulitis   Please see A&P for status of the patient's chronic medical conditions  Past Medical History:  Diagnosis Date  . Arthritis   . Asthma   . Bronchitis   . CHF (congestive heart failure) (HCC)   . Diabetes (HCC) 2019  . Diverticulitis   . DVT, lower extremity, recurrent (HCC)    Patient had unprovoked PE on 2002 and DVT in right lower extremety 2008.  [name] GERD (gastroesophageal reflux disease)   . History of kidney stones   . Hypertension   . PE (pulmonary embolism)    Patient had unprovoked PE on 2002  . PONV (postoperative nausea and vomiting)   . Vertigo    Review of Systems:  ROS: Pulmonary: pt denies increased work of breathing, shortness of breath,  Cardiac: pt denies palpitations, chest pain,  Abdominal: pt denies abdominal pain, nausea, vomiting, or diarrhea   Physical Exam:  Vitals:   01/09/19 1025  BP: (!) 130/54  Pulse: 69  Temp: 98.7 F (37.1 C)  TempSrc: Oral  SpO2: 100%  Weight: 263 lb 11.2 oz (119.6 kg)   Cardiac: normal rate and rhythm, clear s1 and s2, no murmurs, rubs or gallops Pulmonary: CTAB, not in distress Abdominal: non distended abdomen, soft and nontender Psych: Alert, conversant, in good spirits   Social History   Socioeconomic History  . Marital status: Divorced    Spouse name: Not on file  . Number of children: 5  . Years of education: 9 th grade  . Highest education level: 9th grade  Occupational History  . Occupation: retail  Tobacco Use  . Smoking status: Former Smoker    Types: Cigarettes    Quit date: 09/18/1988    Years since quitting: 30.3  . Smokeless tobacco: Never Used  . Tobacco comment: 6 pack year smoking history as a teen  Substance and Sexual Activity  . Alcohol use: No  . Drug use: No  . Sexual activity: Never    Birth control/protection: None  Other Topics Concern  . Not on  file  Social History Narrative   Current Social History 05/17/2017        Patient lives with 20 yo granddaughter" in a/an home / condo / townhome which is 1 story/stories. There are not steps up to the entrance the patient uses.       Patient's method of transportation is church member.      The highest level of education was some high school.      The patient currently retired.      Identified important Relationships are God, family       Pets : 1 lab/pitt mix named Cinnamon       Interests / Fun: Church,TV       Current Stressors: "Me and my granddaughter"       Religious / Personal Beliefs: I'm Holiness and I love people"       Other: "I'd give away my last dime."    Social Determinants of Health   Financial Resource Strain:   . Difficulty of Paying Living Expenses: Not on file  Food Insecurity:   . Worried About Programme researcher, broadcasting/film/video in the Last Year: Not on file  . Ran Out of Food in the Last Year: Not on file  Transportation Needs:   . Lack of Transportation (Medical): Not on file  .  Lack of Transportation (Non-Medical): Not on file  Physical Activity:   . Days of Exercise per Week: Not on file  . Minutes of Exercise per Session: Not on file  Stress:   . Feeling of Stress : Not on file  Social Connections:   . Frequency of Communication with Friends and Family: Not on file  . Frequency of Social Gatherings with Friends and Family: Not on file  . Attends Religious Services: Not on file  . Active Member of Clubs or Organizations: Not on file  . Attends Archivist Meetings: Not on file  . Marital Status: Not on file  Intimate Partner Violence:   . Fear of Current or Ex-Partner: Not on file  . Emotionally Abused: Not on file  . Physically Abused: Not on file  . Sexually Abused: Not on file    Family History  Problem Relation Age of Onset  . Cancer Mother   . Hypertension Sister   . Diabetes Sister   . Hypertension Brother   . Diabetes Brother   .  Hypertension Sister   . Diabetes Sister   . Colon cancer Other   . Heart Problems Other 34       sister's child, open heart surgery  . CVA Father   . Diabetes Son   . Hypertension Son   . Kidney disease Son        on dialysis  . Hypertension Sister   . Diabetes Sister   . Hypertension Sister   . Diabetes Sister   . Cancer Brother   . Hypertension Son   . Diabetes Son   . Hypertension Daughter   . Schizophrenia Daughter     Assessment & Plan:   See Encounters Tab for problem based charting.  Patient discussed with Dr. Evette Doffing

## 2019-01-09 NOTE — Telephone Encounter (Signed)
Agree  Thank you

## 2019-01-09 NOTE — Telephone Encounter (Signed)
Returned call to Amy, PT with [facility]. Verbal auth given for [facility] PT 2 week 3 and 1 week 1 to work on strengthening, short distance walking an safety in home. Will route to PCP/Attending Pool for agreement/denial. Hubbard Hartshorn, BSN, RN-BC

## 2019-01-09 NOTE — Progress Notes (Signed)
Internal Medicine Clinic Attending  Case discussed with Dr. Winfrey  at the time of the visit.  We reviewed the resident's history and exam and pertinent patient test results.  I agree with the assessment, diagnosis, and plan of care documented in the resident's note.  

## 2019-01-09 NOTE — Assessment & Plan Note (Signed)
HFU for diverticulitis: no longer in pain 1-2 bowel movements per day usually one, they are non bloody and formed.  Drinking plenty of water.  Still on abx finishes course on Sunday.  She does not see a GI doctor currently.  She has never had a regular colonoscopy and screens with [facility].  During admission she was noted to have a supratherapeutic INR and is scheduled to see Dr. Elie Confer today for recheck and dosing.  -referral for colonoscopy to take place in around 6 weeks -prescribed metamucil QHS -continue oral abx to finish course

## 2019-01-10 ENCOUNTER — Other Ambulatory Visit: Payer: Self-pay | Admitting: Internal Medicine

## 2019-01-10 DIAGNOSIS — J45909 Unspecified asthma, uncomplicated: Secondary | ICD-10-CM | POA: Diagnosis not present

## 2019-01-10 DIAGNOSIS — K5732 Diverticulitis of large intestine without perforation or abscess without bleeding: Secondary | ICD-10-CM | POA: Diagnosis not present

## 2019-01-10 DIAGNOSIS — I110 Hypertensive heart disease with heart failure: Secondary | ICD-10-CM | POA: Diagnosis not present

## 2019-01-10 DIAGNOSIS — E119 Type 2 diabetes mellitus without complications: Secondary | ICD-10-CM | POA: Diagnosis not present

## 2019-01-10 DIAGNOSIS — Z7901 Long term (current) use of anticoagulants: Secondary | ICD-10-CM | POA: Diagnosis not present

## 2019-01-10 DIAGNOSIS — Z7984 Long term (current) use of oral hypoglycemic drugs: Secondary | ICD-10-CM | POA: Diagnosis not present

## 2019-01-10 DIAGNOSIS — Z86718 Personal history of other venous thrombosis and embolism: Secondary | ICD-10-CM | POA: Diagnosis not present

## 2019-01-10 DIAGNOSIS — Z9181 History of falling: Secondary | ICD-10-CM | POA: Diagnosis not present

## 2019-01-10 DIAGNOSIS — E876 Hypokalemia: Secondary | ICD-10-CM | POA: Diagnosis not present

## 2019-01-10 DIAGNOSIS — R7309 Other abnormal glucose: Secondary | ICD-10-CM

## 2019-01-10 DIAGNOSIS — E118 Type 2 diabetes mellitus with unspecified complications: Secondary | ICD-10-CM

## 2019-01-10 DIAGNOSIS — I5020 Unspecified systolic (congestive) heart failure: Secondary | ICD-10-CM | POA: Diagnosis not present

## 2019-01-14 ENCOUNTER — Ambulatory Visit: Payer: Medicare Other

## 2019-01-14 ENCOUNTER — Ambulatory Visit (INDEPENDENT_AMBULATORY_CARE_PROVIDER_SITE_OTHER): Payer: Medicare Other | Admitting: Pharmacist

## 2019-01-14 DIAGNOSIS — Z86718 Personal history of other venous thrombosis and embolism: Secondary | ICD-10-CM

## 2019-01-14 DIAGNOSIS — Z5181 Encounter for therapeutic drug level monitoring: Secondary | ICD-10-CM | POA: Diagnosis not present

## 2019-01-14 DIAGNOSIS — I82409 Acute embolism and thrombosis of unspecified deep veins of unspecified lower extremity: Secondary | ICD-10-CM | POA: Diagnosis not present

## 2019-01-14 DIAGNOSIS — Z7901 Long term (current) use of anticoagulants: Secondary | ICD-10-CM

## 2019-01-14 LAB — POCT INR: INR: 2.1 (ref 2.0–3.0)

## 2019-01-14 NOTE — Progress Notes (Signed)
Anticoagulation Management Caroline [name] is a 74 y.o. female who reports to the clinic for monitoring of warfarin treatment.    Indication: DVT, History of recurrent (resolved); PE, History of (resolved); Long term current use of anticoagulant.   Duration: indefinite Supervising physician: Reymundo Poll, MD  Anticoagulation Clinic Visit History: Patient does not report signs/symptoms of bleeding or thromboembolism  Other recent changes: No diet, medications, lifestyle changes except as noted in patient findings.  Anticoagulation Episode Summary    Current INR goal:  2.0-3.0  TTR:  79.8 % (3.9 y)  Next INR check:  01/21/2019  INR from last check:  2.1 (01/14/2019)  Weekly max warfarin dose:    Target end date:    INR check location:  Anticoagulation Clinic  Preferred lab:    Send INR reminders to:  ANTICOAG IMP   Indications   DVT lower extremity recurrent (HCC) [I82.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 Penicillins Anaphylaxis, Hives, Swelling and Rash    Has patient had a PCN reaction causing immediate rash, facial/tongue/throat swelling, SOB or lightheadedness with hypotension: Yes Has patient had a PCN reaction causing severe rash involving mucus membranes or skin necrosis: Yes Has patient had a PCN reaction that required hospitalization Yes Has patient had a PCN reaction occurring within the last 10 years: No If all of the above answers are "NO", then may proceed with Cephalosporin use.   . Cabbage Itching  . Keflex [Cephalexin] Hives    And feeling of throat tightness  . Shellfish Allergy Swelling  . Tomato Swelling  . Latex Itching and Rash    Current Outpatient Medications:  .  budesonide-formoterol (SYMBICORT) 160-4.5 MCG/ACT inhaler, Inhale 2 puffs into the lungs 2 (two) times daily., Disp: 3 Inhaler, Rfl: 4 .  calcium citrate-vitamin D (CITRACAL+D) 315-200 MG-UNIT tablet, TAKE 2  TABLETS DAILY BY MOUTH. (Patient taking differently: Take 2 tablets by mouth daily. ), Disp: 160 tablet, Rfl: 2 .  carvedilol (COREG) 12.5 MG tablet, TAKE 1 TABLET BY MOUTH 2 TIMES DAILY. (Patient taking differently: Take 12.5 mg by mouth 2 (two) times daily. ), Disp: 60 tablet, Rfl: 0 .  EPINEPHrine 0.3 mg/0.3 mL IJ SOAJ injection, Inject 0.3 mg into the muscle as needed. Severe allergic reactions, Disp: , Rfl: 0 .  fluticasone (FLONASE) 50 MCG/ACT nasal spray, PLACE 2 SPRAYS DAILY INTO BOTH NOSTRILS. (Patient taking differently: Place 2 sprays into both nostrils daily. ), Disp: 16 mL, Rfl: 1 .  HYDROcodone-acetaminophen (NORCO) 5-325 MG tablet, Take 1 tablet by mouth every 6 (six) hours as needed for severe pain (may cause constipation)., Disp: 20 tablet, Rfl: 0 .  hydrocortisone (PROCTOSOL HC) 2.5 % rectal cream, Place 1 application rectally 2 (two) times daily., Disp: 30 g, Rfl: 0 .  meclizine (ANTIVERT) 25 MG tablet, TAKE 1 TABLET BY MOUTH THREE TIMES A DAY AS NEEDED (Patient taking differently: Take 25 mg by mouth 3 (three) times daily as needed for dizziness. ), Disp: 90 tablet, Rfl: 1 .  metFORMIN (GLUCOPHAGE-XR) 500 MG 24 hr tablet, TAKE 1 TABLET BY MOUTH EVERY DAY WITH BREAKFAST, Disp: 90 tablet, Rfl: 1 .  montelukast (SINGULAIR) 10 MG tablet, Take 1 tablet (10 mg total) by mouth at bedtime., Disp: 90 tablet, Rfl: 3 .  pantoprazole (PROTONIX) 40 MG tablet, TAKE 1 TABLET BY MOUTH EVERY DAY (Patient taking differently: Take 40 mg by mouth  daily. ), Disp: 90 tablet, Rfl: 1 .  phenol (CHLORASEPTIC) 1.4 % LIQD, Use as directed 2 sprays in the mouth or throat as needed for throat irritation / pain., Disp: 29 mL, Rfl: 0 .  PROAIR HFA 108 (90 Base) MCG/ACT inhaler, TAKE 2 PUFFS BY MOUTH EVERY 6 HOURS AS NEEDED FOR WHEEZE OR SHORTNESS OF BREATH (Patient taking differently: Inhale 2 puffs into the lungs every 6 (six) hours as needed for wheezing or shortness of breath. ), Disp: 8.5 Inhaler, Rfl: 2 .   psyllium (HYDROCIL/METAMUCIL) 95 % PACK, Take 1 packet by mouth at bedtime., Disp: 30 packet, Rfl: 0 .  RESTASIS 0.05 % ophthalmic emulsion, PLACE 1 DROP INTO BOTH EYES 2 (TWO) TIMES DAILY. USE ONCE DAILY (Patient taking differently: Place 1 drop into both eyes 2 (two) times daily. ), Disp: 60 mL, Rfl: 0 .  rosuvastatin (CRESTOR) 20 MG tablet, Take 1 tablet (20 mg total) by mouth daily., Disp: 90 tablet, Rfl: 3 .  warfarin (COUMADIN) 2 MG tablet, Take 1 tablet (2 mg total) by mouth daily at 6 PM., Disp: 30 tablet, Rfl: 0 Past Medical History:  Diagnosis Date  . Arthritis   . Asthma   . Bronchitis   . CHF (congestive heart failure) (HCC)   . Diabetes (HCC) 2019  . Diverticulitis   . DVT, lower extremity, recurrent (HCC)    Patient had unprovoked PE on 2002 and DVT in right lower extremety 2008.  [name] GERD (gastroesophageal reflux disease)   . History of kidney stones   . Hypertension   . PE (pulmonary embolism)    Patient had unprovoked PE on 2002  . PONV (postoperative nausea and vomiting)   . Vertigo    Social History   Socioeconomic History  . Marital status: Divorced    Spouse name: Not on file  . Number of children: 5  . Years of education: 9 th grade  . Highest education level: 9th grade  Occupational History  . Occupation: retail  Tobacco Use  . Smoking status: Former Smoker    Types: Cigarettes    Quit date: 09/18/1988    Years since quitting: 30.3  . Smokeless tobacco: Never Used  . Tobacco comment: 6 pack year smoking history as a teen  Substance and Sexual Activity  . Alcohol use: No  . Drug use: No  . Sexual activity: Never    Birth control/protection: None  Other Topics Concern  . Not on file  Social History Narrative   Current Social History 05/17/2017        Patient lives with 82 yo granddaughter" in a/an home / condo / townhome which is 1 story/stories. There are not steps up to the entrance the patient uses.       Patient's method of transportation is  church member.      The highest level of education was some high school.      The patient currently retired.      Identified important Relationships are God, family       Pets : 1 lab/pitt mix named Cinnamon       Interests / Fun: Church,TV       Current Stressors: "Me and my granddaughter"       Religious / Personal Beliefs: I'm Holiness and I love people"       Other: "I'd give away my last dime."    Social Determinants of Health   Financial Resource Strain:   . Difficulty of Paying Living Expenses:  Not on file  Food Insecurity:   . Worried About Charity fundraiser in the Last Year: Not on file  . Ran Out of Food in the Last Year: Not on file  Transportation Needs:   . Lack of Transportation (Medical): Not on file  . Lack of Transportation (Non-Medical): Not on file  Physical Activity:   . Days of Exercise per Week: Not on file  . Minutes of Exercise per Session: Not on file  Stress:   . Feeling of Stress : Not on file  Social Connections:   . Frequency of Communication with Friends and Family: Not on file  . Frequency of Social Gatherings with Friends and Family: Not on file  . Attends Religious Services: Not on file  . Active Member of Clubs or Organizations: Not on file  . Attends Archivist Meetings: Not on file  . Marital Status: Not on file   Family History  Problem Relation Age of Onset  . Cancer Mother   . Hypertension Sister   . Diabetes Sister   . Hypertension Brother   . Diabetes Brother   . Hypertension Sister   . Diabetes Sister   . Colon cancer Other   . Heart Problems Other 34       sister's child, open heart surgery  . CVA Father   . Diabetes Son   . Hypertension Son   . Kidney disease Son        on dialysis  . Hypertension Sister   . Diabetes Sister   . Hypertension Sister   . Diabetes Sister   . Cancer Brother   . Hypertension Son   . Diabetes Son   . Hypertension Daughter   . Schizophrenia Daughter      ASSESSMENT Recent Results: The most recent result is correlated with 14 mg per week: Lab Results  Component Value Date   INR 2.1 01/14/2019   INR 3.3 (H) 01/04/2019   INR 2.2 (H) 01/03/2019    Anticoagulation Dosing: Description   Take one (1) tablet on Mondays, Wednesdays and Fridays. All other days, take only 1/2 tablet.     INR today: Therapeutic  PLAN Weekly dose was increased relative to the decreased dose she had been on for the drug-drug interaction. The offending two antibiotics are finished today.   Patient Instructions  Patient instructed to take medications as defined in the Anti-coagulation Track section of this encounter.  Patient instructed to take today's dose.  Patient instructed to take one (1) tablet on Mondays, Wednesdays and Fridays. All other days, take only 1/2 tablet. Patient verbalized understanding of these instructions.    Patient advised to contact clinic or seek medical attention if signs/symptoms of bleeding or thromboembolism occur.  Patient verbalized understanding by repeating back information and was advised to contact me if further medication-related questions arise. Patient was also provided an information handout.  Follow-up Return in 1 week (on 01/21/2019) for Follow up INR.  Pennie Banter. PharmD, CPP  15 minutes spent face-to-face with the patient during the encounter. 50% of time spent on education, including signs/sx bleeding and clotting, as well as food and drug interactions with warfarin. 50% of time was spent on fingerprick POC INR sample collection,processing, results determination, and documentation in http://www.kim.net/.

## 2019-01-14 NOTE — Patient Instructions (Signed)
Patient instructed to take medications as defined in the Anti-coagulation Track section of this encounter.  Patient instructed to take today's dose.  Patient instructed to take  one (1) tablet on Mondays, Wednesdays and Fridays. All other days, take only 1/2 tablet. Patient verbalized understanding of these instructions.   

## 2019-01-14 NOTE — Progress Notes (Signed)
INTERNAL MEDICINE TEACHING ATTENDING ADDENDUM   I agree with pharmacy recommendations as outlined in their note.   Laveda Demedeiros, MD  

## 2019-01-15 DIAGNOSIS — J45909 Unspecified asthma, uncomplicated: Secondary | ICD-10-CM | POA: Diagnosis not present

## 2019-01-15 DIAGNOSIS — Z7984 Long term (current) use of oral hypoglycemic drugs: Secondary | ICD-10-CM | POA: Diagnosis not present

## 2019-01-15 DIAGNOSIS — E876 Hypokalemia: Secondary | ICD-10-CM | POA: Diagnosis not present

## 2019-01-15 DIAGNOSIS — I5020 Unspecified systolic (congestive) heart failure: Secondary | ICD-10-CM | POA: Diagnosis not present

## 2019-01-15 DIAGNOSIS — Z7901 Long term (current) use of anticoagulants: Secondary | ICD-10-CM | POA: Diagnosis not present

## 2019-01-15 DIAGNOSIS — Z9181 History of falling: Secondary | ICD-10-CM | POA: Diagnosis not present

## 2019-01-15 DIAGNOSIS — Z86718 Personal history of other venous thrombosis and embolism: Secondary | ICD-10-CM | POA: Diagnosis not present

## 2019-01-15 DIAGNOSIS — I110 Hypertensive heart disease with heart failure: Secondary | ICD-10-CM | POA: Diagnosis not present

## 2019-01-15 DIAGNOSIS — E119 Type 2 diabetes mellitus without complications: Secondary | ICD-10-CM | POA: Diagnosis not present

## 2019-01-15 DIAGNOSIS — K5732 Diverticulitis of large intestine without perforation or abscess without bleeding: Secondary | ICD-10-CM | POA: Diagnosis not present

## 2019-01-16 ENCOUNTER — Other Ambulatory Visit: Payer: Self-pay | Admitting: Internal Medicine

## 2019-01-16 DIAGNOSIS — J301 Allergic rhinitis due to pollen: Secondary | ICD-10-CM

## 2019-01-17 DIAGNOSIS — I5020 Unspecified systolic (congestive) heart failure: Secondary | ICD-10-CM | POA: Diagnosis not present

## 2019-01-17 DIAGNOSIS — E119 Type 2 diabetes mellitus without complications: Secondary | ICD-10-CM | POA: Diagnosis not present

## 2019-01-17 DIAGNOSIS — Z7901 Long term (current) use of anticoagulants: Secondary | ICD-10-CM | POA: Diagnosis not present

## 2019-01-17 DIAGNOSIS — Z86718 Personal history of other venous thrombosis and embolism: Secondary | ICD-10-CM | POA: Diagnosis not present

## 2019-01-17 DIAGNOSIS — I110 Hypertensive heart disease with heart failure: Secondary | ICD-10-CM | POA: Diagnosis not present

## 2019-01-17 DIAGNOSIS — K5732 Diverticulitis of large intestine without perforation or abscess without bleeding: Secondary | ICD-10-CM | POA: Diagnosis not present

## 2019-01-17 DIAGNOSIS — E876 Hypokalemia: Secondary | ICD-10-CM | POA: Diagnosis not present

## 2019-01-17 DIAGNOSIS — Z9181 History of falling: Secondary | ICD-10-CM | POA: Diagnosis not present

## 2019-01-17 DIAGNOSIS — Z7984 Long term (current) use of oral hypoglycemic drugs: Secondary | ICD-10-CM | POA: Diagnosis not present

## 2019-01-17 DIAGNOSIS — J45909 Unspecified asthma, uncomplicated: Secondary | ICD-10-CM | POA: Diagnosis not present

## 2019-01-21 ENCOUNTER — Ambulatory Visit (INDEPENDENT_AMBULATORY_CARE_PROVIDER_SITE_OTHER): Payer: Medicare Other | Admitting: Pharmacist

## 2019-01-21 ENCOUNTER — Encounter: Payer: Self-pay | Admitting: Internal Medicine

## 2019-01-21 DIAGNOSIS — Z5181 Encounter for therapeutic drug level monitoring: Secondary | ICD-10-CM | POA: Diagnosis not present

## 2019-01-21 DIAGNOSIS — I82409 Acute embolism and thrombosis of unspecified deep veins of unspecified lower extremity: Secondary | ICD-10-CM | POA: Diagnosis not present

## 2019-01-21 DIAGNOSIS — Z86718 Personal history of other venous thrombosis and embolism: Secondary | ICD-10-CM

## 2019-01-21 DIAGNOSIS — Z7901 Long term (current) use of anticoagulants: Secondary | ICD-10-CM

## 2019-01-21 LAB — POCT INR: INR: 2.4 (ref 2.0–3.0)

## 2019-01-21 NOTE — Progress Notes (Signed)
Anticoagulation Management Caroline [name] is a 74 y.o. female who reports to the clinic for monitoring of warfarin treatment.    Indication: DVT, History of (resolved); Long term current use of anticoagulant.   Duration: indefinite Supervising physician: [facility] Visit History: Patient does not report signs/symptoms of bleeding or thromboembolism  Other recent changes: No diet, medications, lifestyle changes at this visit.  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 Penicillins Anaphylaxis, Hives, Swelling and Rash    Has patient had a PCN reaction causing immediate rash, facial/tongue/throat swelling, SOB or lightheadedness with hypotension: Yes Has patient had a PCN reaction causing severe rash involving mucus membranes or skin necrosis: Yes Has patient had a PCN reaction that required hospitalization Yes Has patient had a PCN reaction occurring within the last 10 years: No If all of the above answers are "NO", then may proceed with Cephalosporin use.   . Cabbage Itching  . Keflex [Cephalexin] Hives    And feeling of throat tightness  . Shellfish Allergy Swelling  . Tomato Swelling  . Latex Itching and Rash    Current Outpatient Medications:  .  budesonide-formoterol (SYMBICORT) 160-4.5 MCG/ACT inhaler, Inhale 2 puffs into the lungs 2 (two) times daily., Disp: 3 Inhaler, Rfl: 4 .  calcium citrate-vitamin D (CITRACAL+D) 315-200 MG-UNIT tablet, TAKE 2 TABLETS DAILY BY MOUTH. (Patient taking differently: Take 2 tablets by  mouth daily. ), Disp: 160 tablet, Rfl: 2 .  carvedilol (COREG) 12.5 MG tablet, TAKE 1 TABLET BY MOUTH 2 TIMES DAILY. (Patient taking differently: Take 12.5 mg by mouth 2 (two) times daily. ), Disp: 60 tablet, Rfl: 0 .  EPINEPHrine 0.3 mg/0.3 mL IJ SOAJ injection, Inject 0.3 mg into the muscle as needed. Severe allergic reactions, Disp: , Rfl: 0 .  fluticasone (FLONASE) 50 MCG/ACT nasal spray, PLACE 2 SPRAYS DAILY INTO BOTH NOSTRILS., Disp: 48 mL, Rfl: 1 .  HYDROcodone-acetaminophen (NORCO) 5-325 MG tablet, Take 1 tablet by mouth every 6 (six) hours as needed for severe pain (may cause constipation)., Disp: 20 tablet, Rfl: 0 .  hydrocortisone (PROCTOSOL HC) 2.5 % rectal cream, Place 1 application rectally 2 (two) times daily., Disp: 30 g, Rfl: 0 .  meclizine (ANTIVERT) 25 MG tablet, TAKE 1 TABLET BY MOUTH THREE TIMES A DAY AS NEEDED (Patient taking differently: Take 25 mg by mouth 3 (three) times daily as needed for dizziness. ), Disp: 90 tablet, Rfl: 1 .  metFORMIN (GLUCOPHAGE-XR) 500 MG 24 hr tablet, TAKE 1 TABLET BY MOUTH EVERY DAY WITH BREAKFAST, Disp: 90 tablet, Rfl: 1 .  montelukast (SINGULAIR) 10 MG tablet, Take 1 tablet (10 mg total) by mouth at bedtime., Disp: 90 tablet, Rfl: 3 .  pantoprazole (PROTONIX) 40 MG tablet, TAKE 1 TABLET BY MOUTH EVERY DAY (Patient taking differently: Take 40 mg by mouth daily. ), Disp: 90 tablet, Rfl: 1 .  phenol (CHLORASEPTIC) 1.4 % LIQD, Use as directed 2 sprays in  the mouth or throat as needed for throat irritation / pain., Disp: 29 mL, Rfl: 0 .  PROAIR HFA 108 (90 Base) MCG/ACT inhaler, TAKE 2 PUFFS BY MOUTH EVERY 6 HOURS AS NEEDED FOR WHEEZE OR SHORTNESS OF BREATH (Patient taking differently: Inhale 2 puffs into the lungs every 6 (six) hours as needed for wheezing or shortness of breath. ), Disp: 8.5 Inhaler, Rfl: 2 .  psyllium (HYDROCIL/METAMUCIL) 95 % PACK, Take 1 packet by mouth at bedtime., Disp: 30 packet, Rfl: 0 .  RESTASIS 0.05 % ophthalmic emulsion,  PLACE 1 DROP INTO BOTH EYES 2 (TWO) TIMES DAILY. USE ONCE DAILY (Patient taking differently: Place 1 drop into both eyes 2 (two) times daily. ), Disp: 60 mL, Rfl: 0 .  rosuvastatin (CRESTOR) 20 MG tablet, Take 1 tablet (20 mg total) by mouth daily., Disp: 90 tablet, Rfl: 3 .  warfarin (COUMADIN) 2 MG tablet, Take 1 tablet (2 mg total) by mouth daily at 6 PM., Disp: 30 tablet, Rfl: 0 Past Medical History:  Diagnosis Date  . Arthritis   . Asthma   . Bronchitis   . CHF (congestive heart failure) (H[facility])   . Diabetes ([facility]) 2019  . Diverticulitis   . DVT, lower extremity, recurrent ([facility])    Patient had unprovoked PE on 2002 and DVT in right lower extremety 2008.  [name] GERD (gastroesophageal reflux disease)   . History of kidney stones   . Hypertension   . PE (pulmonary embolism)    Patient had unprovoked PE on 2002  . PONV (postoperative nausea and vomiting)   . Vertigo    Social History   Socioeconomic History  . Marital status: Divorced    Spouse name: Not on file  . Number of children: 5  . Years of education: 9 th grade  . Highest education level: 9th grade  Occupational History  . Occupation: retail  Tobacco Use  . Smoking status: Former Smoker    Types: Cigarettes    Quit date: 09/18/1988    Years since quitting: 30.3  . Smokeless tobacco: Never Used  . Tobacco comment: 6 pack year smoking history as a teen  Substance and Sexual Activity  . Alcohol use: No  . Drug use: No  . Sexual activity: Never    Birth control/protection: None  Other Topics Concern  . Not on file  Social History Narrative   Current Social History 05/17/2017        Patient lives with 30 yo granddaughter" in a/an home / condo / townhome which is 1 story/stories. There are not steps up to the entrance the patient uses.       Patient's method of transportation is church member.      The highest level of education was some high school.      The patient currently retired.      Identified important  Relationships are God, family       Pets : 1 lab/pitt mix named Cinnamon       Interests / Fun: Church,TV       Current Stressors: "Me and my granddaughter"       Religious / Personal Beliefs: I'm Holiness and I love people"       Other: "I'd give away my last dime."    Social Determinants of Health   Financial Resource Strain:   . Difficulty of Paying Living Expenses: Not on file  Food Insecurity:   . Worried About Programme researcher, broadcasting/film/video in the Last Year: Not  on file  . Ran Out of Food in the Last Year: Not on file  Transportation Needs:   . Lack of Transportation (Medical): Not on file  . Lack of Transportation (Non-Medical): Not on file  Physical Activity:   . Days of Exercise per Week: Not on file  . Minutes of Exercise per Session: Not on file  Stress:   . Feeling of Stress : Not on file  Social Connections:   . Frequency of Communication with Friends and Family: Not on file  . Frequency of Social Gatherings with Friends and Family: Not on file  . Attends Religious Services: Not on file  . Active Member of Clubs or Organizations: Not on file  . Attends Banker Meetings: Not on file  . Marital Status: Not on file   Family History  Problem Relation Age of Onset  . Cancer Mother   . Hypertension Sister   . Diabetes Sister   . Hypertension Brother   . Diabetes Brother   . Hypertension Sister   . Diabetes Sister   . Colon cancer Other   . Heart Problems Other 34       sister's child, open heart surgery  . CVA Father   . Diabetes Son   . Hypertension Son   . Kidney disease Son        on dialysis  . Hypertension Sister   . Diabetes Sister   . Hypertension Sister   . Diabetes Sister   . Cancer Brother   . Hypertension Son   . Diabetes Son   . Hypertension Daughter   . Schizophrenia Daughter     ASSESSMENT Recent Results: The most recent result is correlated with 20 mg per week: Lab Results  Component Value Date   INR 2.4 01/21/2019   INR 2.1  01/14/2019   INR 3.3 (H) 01/04/2019    Anticoagulation Dosing: Description   Take one (1) tablet on Mondays, Wednesdays and Fridays. All other days, take only 1/2 tablet.     INR today: Therapeutic  PLAN Weekly dose was unchanged.   Patient Instructions  Patient instructed to take medications as defined in the Anti-coagulation Track section of this encounter.  Patient instructed to take today's dose.  Patient instructed to take  one (1) tablet on Mondays, Wednesdays and Fridays. All other days, take only 1/2 tablet. Patient verbalized understanding of these instructions.    Patient advised to contact clinic or seek medical attention if signs/symptoms of bleeding or thromboembolism occur.  Patient verbalized understanding by repeating back information and was advised to contact me if further medication-related questions arise. Patient was also provided an information handout.  Follow-up Return in 3 weeks (on 02/11/2019) for Follow up INR.  Carollee Massed, CPP  15 minutes spent face-to-face with the patient during the encounter. 50% of time spent on education, including signs/sx bleeding and clotting, as well as food and drug interactions with warfarin. 50% of time was spent on fingerprick POC INR sample collection,processing, results determination, and documentation in [email or web address].

## 2019-01-21 NOTE — Patient Instructions (Signed)
Patient instructed to take medications as defined in the Anti-coagulation Track section of this encounter.  Patient instructed to take today's dose.  Patient instructed to take  one (1) tablet on Mondays, Wednesdays and Fridays. All other days, take only 1/2 tablet. Patient verbalized understanding of these instructions.

## 2019-01-25 NOTE — Progress Notes (Signed)
I have reviewed Dr. Saralyn Pilar noted and agree with plan.  INR at goal.

## 2019-01-27 ENCOUNTER — Other Ambulatory Visit: Payer: Self-pay | Admitting: Cardiology

## 2019-01-27 DIAGNOSIS — I10 Essential (primary) hypertension: Secondary | ICD-10-CM

## 2019-01-28 DIAGNOSIS — Z86718 Personal history of other venous thrombosis and embolism: Secondary | ICD-10-CM | POA: Diagnosis not present

## 2019-01-28 DIAGNOSIS — Z7901 Long term (current) use of anticoagulants: Secondary | ICD-10-CM | POA: Diagnosis not present

## 2019-01-28 DIAGNOSIS — I110 Hypertensive heart disease with heart failure: Secondary | ICD-10-CM | POA: Diagnosis not present

## 2019-01-28 DIAGNOSIS — K5732 Diverticulitis of large intestine without perforation or abscess without bleeding: Secondary | ICD-10-CM | POA: Diagnosis not present

## 2019-01-28 DIAGNOSIS — I5020 Unspecified systolic (congestive) heart failure: Secondary | ICD-10-CM | POA: Diagnosis not present

## 2019-01-28 DIAGNOSIS — E119 Type 2 diabetes mellitus without complications: Secondary | ICD-10-CM | POA: Diagnosis not present

## 2019-01-28 DIAGNOSIS — J45909 Unspecified asthma, uncomplicated: Secondary | ICD-10-CM | POA: Diagnosis not present

## 2019-01-28 DIAGNOSIS — E876 Hypokalemia: Secondary | ICD-10-CM | POA: Diagnosis not present

## 2019-01-28 DIAGNOSIS — Z7984 Long term (current) use of oral hypoglycemic drugs: Secondary | ICD-10-CM | POA: Diagnosis not present

## 2019-01-28 DIAGNOSIS — Z9181 History of falling: Secondary | ICD-10-CM | POA: Diagnosis not present

## 2019-02-11 ENCOUNTER — Ambulatory Visit (INDEPENDENT_AMBULATORY_CARE_PROVIDER_SITE_OTHER): Payer: Medicare Other | Admitting: Pharmacist

## 2019-02-11 ENCOUNTER — Other Ambulatory Visit: Payer: Self-pay | Admitting: Internal Medicine

## 2019-02-11 DIAGNOSIS — K219 Gastro-esophageal reflux disease without esophagitis: Secondary | ICD-10-CM

## 2019-02-11 DIAGNOSIS — Z5181 Encounter for therapeutic drug level monitoring: Secondary | ICD-10-CM | POA: Diagnosis not present

## 2019-02-11 DIAGNOSIS — I82409 Acute embolism and thrombosis of unspecified deep veins of unspecified lower extremity: Secondary | ICD-10-CM

## 2019-02-11 DIAGNOSIS — Z86711 Personal history of pulmonary embolism: Secondary | ICD-10-CM | POA: Diagnosis not present

## 2019-02-11 DIAGNOSIS — Z7901 Long term (current) use of anticoagulants: Secondary | ICD-10-CM

## 2019-02-11 LAB — POCT INR: INR: 2.3 (ref 2.0–3.0)

## 2019-02-11 NOTE — Patient Instructions (Signed)
Patient instructed to take medications as defined in the Anti-coagulation Track section of this encounter.  Patient instructed to take today's dose.  Patient instructed to take  one (1) tablet on Mondays, Wednesdays and Fridays. All other days, take only 1/2 tablet. Patient verbalized understanding of these instructions.   

## 2019-02-11 NOTE — Progress Notes (Signed)
Anticoagulation Management Caroline [name] is a 74 y.o. female who reports to the clinic for monitoring of warfarin treatment.    Indication: DVT, History of (resolved); Long term current use of anticoagulant.  Duration: indefinite Supervising physician: Reymundo Poll, MD  Anticoagulation Clinic Visit History: Patient does not report signs/symptoms of bleeding or thromboembolism  Other recent changes: No diet, medications, lifestyle changes.  Anticoagulation Episode Summary    Current INR goal:  2.0-3.0  TTR:  80.2 % (4 y)  Next INR check:  03/11/2019  INR from last check:  2.3 (02/11/2019)  Weekly max warfarin dose:    Target end date:    INR check location:  Anticoagulation Clinic  Preferred lab:    Send INR reminders to:  [facility]   Indications   DVT lower extremity recurrent ([facility]) [I82.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 Penicillins Anaphylaxis, Hives, Swelling and Rash    Has patient had a PCN reaction causing immediate rash, facial/tongue/throat swelling, SOB or lightheadedness with hypotension: Yes Has patient had a PCN reaction causing severe rash involving mucus membranes or skin necrosis: Yes Has patient had a PCN reaction that required hospitalization Yes Has patient had a PCN reaction occurring within the last 10 years: No If all of the above answers are "NO", then may proceed with Cephalosporin use.   . Cabbage Itching  . Keflex [Cephalexin] Hives    And feeling of throat tightness  . Shellfish Allergy Swelling  . Tomato Swelling  . Latex Itching and Rash    Current Outpatient Medications:  .  budesonide-formoterol (SYMBICORT) 160-4.5 MCG/ACT inhaler, Inhale 2 puffs into the lungs 2 (two) times daily., Disp: 3 Inhaler, Rfl: 4 .  calcium citrate-vitamin D (CITRACAL+D) 315-200 MG-UNIT tablet, TAKE 2 TABLETS DAILY BY MOUTH. (Patient taking differently: Take 2 tablets by mouth  daily. ), Disp: 160 tablet, Rfl: 2 .  carvedilol (COREG) 12.5 MG tablet, Take 1 tablet (12.5 mg total) by mouth 2 (two) times daily. Please make appt with Dr. Elberta Fortis for refills. [phone]., Disp: 60 tablet, Rfl: 0 .  EPINEPHrine 0.3 mg/0.3 mL IJ SOAJ injection, Inject 0.3 mg into the muscle as needed. Severe allergic reactions, Disp: , Rfl: 0 .  fluticasone (FLONASE) 50 MCG/ACT nasal spray, PLACE 2 SPRAYS DAILY INTO BOTH NOSTRILS., Disp: 48 mL, Rfl: 1 .  HYDROcodone-acetaminophen (NORCO) 5-325 MG tablet, Take 1 tablet by mouth every 6 (six) hours as needed for severe pain (may cause constipation)., Disp: 20 tablet, Rfl: 0 .  hydrocortisone (PROCTOSOL HC) 2.5 % rectal cream, Place 1 application rectally 2 (two) times daily., Disp: 30 g, Rfl: 0 .  meclizine (ANTIVERT) 25 MG tablet, TAKE 1 TABLET BY MOUTH THREE TIMES A DAY AS NEEDED (Patient taking differently: Take 25 mg by mouth 3 (three) times daily as needed for dizziness. ), Disp: 90 tablet, Rfl: 1 .  metFORMIN (GLUCOPHAGE-XR) 500 MG 24 hr tablet, TAKE 1 TABLET BY MOUTH EVERY DAY WITH BREAKFAST, Disp: 90 tablet, Rfl: 1 .  montelukast (SINGULAIR) 10 MG tablet, Take 1 tablet (10 mg total) by mouth at bedtime., Disp: 90 tablet, Rfl: 3 .  pantoprazole (PROTONIX) 40 MG tablet, TAKE 1 TABLET BY MOUTH EVERY DAY (Patient taking differently: Take 40 mg by mouth daily. ), Disp: 90 tablet, Rfl: 1 .  phenol (CHLORASEPTIC) 1.4 % LIQD, Use as directed 2 sprays in the mouth or  throat as needed for throat irritation / pain., Disp: 29 mL, Rfl: 0 .  PROAIR HFA 108 (90 Base) MCG/ACT inhaler, TAKE 2 PUFFS BY MOUTH EVERY 6 HOURS AS NEEDED FOR WHEEZE OR SHORTNESS OF BREATH (Patient taking differently: Inhale 2 puffs into the lungs every 6 (six) hours as needed for wheezing or shortness of breath. ), Disp: 8.5 Inhaler, Rfl: 2 .  RESTASIS 0.05 % ophthalmic emulsion, PLACE 1 DROP INTO BOTH EYES 2 (TWO) TIMES DAILY. USE ONCE DAILY (Patient taking differently: Place 1 drop  into both eyes 2 (two) times daily. ), Disp: 60 mL, Rfl: 0 .  rosuvastatin (CRESTOR) 20 MG tablet, Take 1 tablet (20 mg total) by mouth daily., Disp: 90 tablet, Rfl: 3 .  warfarin (COUMADIN) 2 MG tablet, Take 1 tablet (2 mg total) by mouth daily at 6 PM., Disp: 30 tablet, Rfl: 0 Past Medical History:  Diagnosis Date  . Arthritis   . Asthma   . Bronchitis   . CHF (congestive heart failure) ([place])   . Diabetes ([place]) 2019  . Diverticulitis   . DVT, lower extremity, recurrent ([name])    Patient had unprovoked PE on 2002 and DVT in right lower extremety 2008.  [name] GERD (gastroesophageal reflux disease)   . History of kidney stones   . Hypertension   . PE (pulmonary embolism)    Patient had unprovoked PE on 2002  . PONV (postoperative nausea and vomiting)   . Vertigo    Social History   Socioeconomic History  . Marital status: Divorced    Spouse name: Not on file  . Number of children: 5  . Years of education: 9 th grade  . Highest education level: 9th grade  Occupational History  . Occupation: retail  Tobacco Use  . Smoking status: Former Smoker    Types: Cigarettes    Quit date: 09/18/1988    Years since quitting: 30.4  . Smokeless tobacco: Never Used  . Tobacco comment: 6 pack year smoking history as a teen  Substance and Sexual Activity  . Alcohol use: No  . Drug use: No  . Sexual activity: Never    Birth control/protection: None  Other Topics Concern  . Not on file  Social History Narrative   Current Social History 05/17/2017        Patient lives with 25 yo granddaughter" in a/an home / condo / townhome which is 1 story/stories. There are not steps up to the entrance the patient uses.       Patient's method of transportation is church member.      The highest level of education was some high school.      The patient currently retired.      Identified important Relationships are God, family       Pets : 1 lab/pitt mix named Cinnamon       Interests / Fun: Church,TV        Current Stressors: "Me and my granddaughter"       Religious / Personal Beliefs: I'm Holiness and I love people"       Other: "I'd give away my last dime."    Social Determinants of Health   Financial Resource Strain:   . Difficulty of Paying Living Expenses: Not on file  Food Insecurity:   . Worried About Charity fundraiser in the Last Year: Not on file  . Ran Out of Food in the Last Year: Not on file  Transportation Needs:   . Lack  of Transportation (Medical): Not on file  . Lack of Transportation (Non-Medical): Not on file  Physical Activity:   . Days of Exercise per Week: Not on file  . Minutes of Exercise per Session: Not on file  Stress:   . Feeling of Stress : Not on file  Social Connections:   . Frequency of Communication with Friends and Family: Not on file  . Frequency of Social Gatherings with Friends and Family: Not on file  . Attends Religious Services: Not on file  . Active Member of Clubs or Organizations: Not on file  . Attends Banker Meetings: Not on file  . Marital Status: Not on file   Family History  Problem Relation Age of Onset  . Cancer Mother   . Hypertension Sister   . Diabetes Sister   . Hypertension Brother   . Diabetes Brother   . Hypertension Sister   . Diabetes Sister   . Colon cancer Other   . Heart Problems Other 34       sister's child, open heart surgery  . CVA Father   . Diabetes Son   . Hypertension Son   . Kidney disease Son        on dialysis  . Hypertension Sister   . Diabetes Sister   . Hypertension Sister   . Diabetes Sister   . Cancer Brother   . Hypertension Son   . Diabetes Son   . Hypertension Daughter   . Schizophrenia Daughter     ASSESSMENT Recent Results: The most recent result is correlated with 20 mg per week: Lab Results  Component Value Date   INR 2.3 02/11/2019   INR 2.4 01/21/2019   INR 2.1 01/14/2019    Anticoagulation Dosing: Description   Take one (1) tablet on Mondays,  Wednesdays and Fridays. All other days, take only 1/2 tablet.     INR today: Therapeutic  PLAN Weekly dose was unchanged.   Patient Instructions  Patient instructed to take medications as defined in the Anti-coagulation Track section of this encounter.  Patient instructed to take today's dose.  Patient instructed to take one (1) tablet on Mondays, Wednesdays and Fridays. All other days, take only 1/2 tablet. Patient verbalized understanding of these instructions.     Patient advised to contact clinic or seek medical attention if signs/symptoms of bleeding or thromboembolism occur.  Patient verbalized understanding by repeating back information and was advised to contact me if further medication-related questions arise. Patient was also provided an information handout.  Follow-up Return in 4 weeks (on 03/11/2019) for Follow up INR.  Elicia Lamp, PharmD, CPP  15 minutes spent face-to-face with the patient during the encounter. 50% of time spent on education, including signs/sx bleeding and clotting, as well as food and drug interactions with warfarin. 50% of time was spent on fingerprick POC INR sample collection,processing, results determination, and documentation in [email or web address].

## 2019-02-11 NOTE — Progress Notes (Signed)
INTERNAL MEDICINE TEACHING ATTENDING ADDENDUM   I agree with pharmacy recommendations as outlined in their note.   Yohan Samons, MD  

## 2019-02-20 ENCOUNTER — Other Ambulatory Visit: Payer: Self-pay | Admitting: Internal Medicine

## 2019-02-20 ENCOUNTER — Ambulatory Visit
Admission: RE | Admit: 2019-02-20 | Discharge: 2019-02-20 | Disposition: A | Discharge status: Home / Self Care | Payer: Medicare Other | Source: Ambulatory Visit | Attending: Internal Medicine | Admitting: Internal Medicine

## 2019-02-20 ENCOUNTER — Other Ambulatory Visit: Payer: Self-pay

## 2019-02-20 DIAGNOSIS — Z1231 Encounter for screening mammogram for malignant neoplasm of breast: Secondary | ICD-10-CM

## 2019-02-20 DIAGNOSIS — E7800 Pure hypercholesterolemia, unspecified: Secondary | ICD-10-CM

## 2019-02-25 ENCOUNTER — Other Ambulatory Visit: Payer: Self-pay | Admitting: Internal Medicine

## 2019-02-25 DIAGNOSIS — R42 Dizziness and giddiness: Secondary | ICD-10-CM

## 2019-02-26 ENCOUNTER — Telehealth: Payer: Self-pay | Admitting: *Deleted

## 2019-02-26 ENCOUNTER — Encounter: Payer: Self-pay | Admitting: Nurse Practitioner

## 2019-02-26 ENCOUNTER — Other Ambulatory Visit: Payer: Self-pay | Admitting: Cardiology

## 2019-02-26 ENCOUNTER — Ambulatory Visit (INDEPENDENT_AMBULATORY_CARE_PROVIDER_SITE_OTHER): Payer: Medicare Other | Admitting: Nurse Practitioner

## 2019-02-26 VITALS — BP 136/76 | HR 80 | Temp 98.4°F | Ht 61.5 in | Wt 255.2 lb

## 2019-02-26 DIAGNOSIS — Z01818 Encounter for other preprocedural examination: Secondary | ICD-10-CM

## 2019-02-26 DIAGNOSIS — K5732 Diverticulitis of large intestine without perforation or abscess without bleeding: Secondary | ICD-10-CM

## 2019-02-26 DIAGNOSIS — Z7901 Long term (current) use of anticoagulants: Secondary | ICD-10-CM

## 2019-02-26 DIAGNOSIS — D509 Iron deficiency anemia, unspecified: Secondary | ICD-10-CM | POA: Diagnosis not present

## 2019-02-26 DIAGNOSIS — I10 Essential (primary) hypertension: Secondary | ICD-10-CM

## 2019-02-26 MED ORDER — NA SULFATE-K SULFATE-MG SULF 17.5-3.13-1.6 GM/177ML PO SOLN: 1.0000 | Freq: Once | ORAL | 0 refills | Status: AC

## 2019-02-26 NOTE — Progress Notes (Signed)
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  - CT scan during late December hospitalization showed persistent sigmoid diverticulitis (episode in November), with increased surrounding phlegmon but no clearly drainable fluid collection.  -Symptoms resolved after antibiotics, she completed an additional 9 days post hospitalization.  Nontender on exam -Patient has never had a colonoscopy.  We should proceed with colonoscopy (sometime in March) to rule out an underlying colon neoplasm. The risks and benefits of colonoscopy with possible polypectomy / biopsies were discussed and the patient agrees to proceed.  -Between now and the time of her colonoscopy should patient get any recurrent abdominal pain she will call our office  #  Microcytic anemia.  Chronic and stable, dating back at least 3 years. Does not appear to be iron deficient -Hgb 9-10 range.  -Further evaluation at time of colonoscopy  # Hx of DVT /  Chronic anticoagulation, on Coumadin -- Hold Coumadinx for 5 days before procedure - will instruct when and how to resume after procedure. Patient understands that there is a low but real risk of cardiovascular event such as heart attack, stroke, or embolism /  thrombosis, or ischemia while off Coumadin. The patient consents to proceed. Will communicate by phone or EMR with patient's prescribing provider to confirm that holding Coumadin is reasonable in this case.   HPI:     Chief Complaint:   Recent diverticulitis.   Caroline [name] is a 74 y.o. 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  She had episode of documented diverticulitis September 2019, November 2019 and again in late December.  CT scan 12/31/18 (inpatient) showed sigmoid diverticulosis with persistent prominent wall thickening involving the proximal to mid sigmoid colon.  Prominent surrounding inflammation with increased soft tissue posterior, lateral and inferior to the sigmoid colon suggesting extensive phlegmon.  No clearly drainable fluid collection found.  No extraluminal gas  Caroline [name] was discharged from the hospital 01/04/2019 with 9 days of Suprax.  She is feeling much better.  No further abdominal pain.  Appetite is normal. Bowel movements at baseline.  She has history of GERD, symptoms nicely controlled on PPI   Data Reviewed:   Ferritin 770, TIBC 157, 15 % sat   Past Medical History:  Diagnosis Date  . Arthritis   . Asthma   . Bronchitis   . CHF (congestive heart failure) ([place])   . Diabetes ([place]) 2019  . Diverticulitis   . DVT, lower extremity, recurrent ([place])    Patient had unprovoked PE on 2002 and DVT in right lower extremety 2008.  [name] GERD (gastroesophageal reflux disease)   . History of kidney stones   . Hypertension   . PE (pulmonary embolism)    Patient had unprovoked PE on 2002  . PONV (postoperative nausea and vomiting)   . Vertigo      Past Surgical History:  Procedure Laterality Date  . ABDOMINAL HYSTERECTOMY     1980  . CYSTOSCOPY W/ URETERAL STENT PLACEMENT Right 09/16/2015   Procedure: CYSTOSCOPY WITH RETROGRADE PYELOGRAM/URETERAL STENT PLACEMENT;  Surgeon: Bjorn Loser, MD;  Location: [facility];  Service: Urology;  Laterality: Right;  . CYSTOSCOPY WITH RETROGRADE PYELOGRAM, URETEROSCOPY AND STENT PLACEMENT Right 12/11/2015   Procedure: RIGHT URETEROSCOPY/HOLMIUM LASER LITHOTRIPSY AND STONE REMOVAL removal and placement of double j stent;  Surgeon: Ardis Hughs, MD;  Location[facility];  Service: Urology;  Laterality: Right;  .  HOLMIUM LASER APPLICATION Right 97/09/8919   Procedure: HOLMIUM LASER APPLICATION;  Surgeon: Ardis Hughs, MD;  Location: [facility];  Service: Urology;  Laterality: Right;   Family History  Problem Relation Age of Onset  . Cancer Mother   . Hypertension Sister   . Diabetes Sister   . Hypertension Brother   . Diabetes Brother   . Hypertension Sister   . Diabetes Sister   . Colon cancer Other   . Heart Problems Other 34       sister's child, open heart surgery  . CVA Father   . Diabetes Son   . Hypertension Son   . Kidney disease Son        on dialysis  . Hypertension Sister   . Diabetes Sister   . Hypertension Sister   . Diabetes Sister   . Cancer Brother   . Hypertension Son   . Diabetes Son   . Hypertension Daughter   . Schizophrenia Daughter    Social History   Tobacco Use  . Smoking status: Former Smoker    Types: Cigarettes    Quit date: 09/18/1988    Years since quitting: 30.4  . Smokeless tobacco: Never Used  . Tobacco comment: 6 pack year smoking history as a teen  Substance Use Topics  . Alcohol use: No  . Drug use: No   Current Outpatient Medications  Medication Sig Dispense Refill  . budesonide-formoterol (SYMBICORT) 160-4.5 MCG/ACT inhaler Inhale 2 puffs into the lungs 2 (two) times daily. 3 Inhaler 4  . calcium citrate-vitamin D (CITRACAL+D) 315-200 MG-UNIT tablet TAKE 2 TABLETS DAILY BY MOUTH. (Patient taking differently: Take 2 tablets by mouth daily. ) 160 tablet 2  . carvedilol (COREG) 12.5 MG tablet Take 1 tablet (12.5 mg total) by mouth 2 (two) times daily. Please make appt with Dr. Curt Bears for refills. [phone]. 60 tablet 0  . EPINEPHrine 0.3 mg/0.3 mL IJ SOAJ injection Inject 0.3 mg into the muscle as needed. Severe allergic reactions  0  . fluticasone (FLONASE) 50 MCG/ACT nasal spray PLACE 2 SPRAYS DAILY INTO BOTH NOSTRILS. 48 mL 1  . hydrocortisone (PROCTOSOL HC) 2.5 % rectal cream Place 1 application rectally 2 (two) times daily. 30 g 0  .  meclizine (ANTIVERT) 25 MG tablet TAKE 1 TABLET BY MOUTH THREE TIMES A DAY AS NEEDED (Patient taking differently: Take 25 mg by mouth 3 (three) times daily as needed for dizziness. ) 90 tablet 1  . metFORMIN (GLUCOPHAGE-XR) 500 MG 24 hr tablet TAKE 1 TABLET BY MOUTH EVERY DAY WITH BREAKFAST 90 tablet 1  . montelukast (SINGULAIR) 10 MG tablet Take 1 tablet (10 mg total) by mouth at bedtime. 90 tablet 3  . pantoprazole (PROTONIX) 40 MG tablet TAKE 1 TABLET BY MOUTH EVERY DAY 90 tablet 1  . PROAIR HFA 108 (90 Base) MCG/ACT inhaler TAKE 2 PUFFS BY MOUTH EVERY 6 HOURS AS NEEDED FOR WHEEZE OR SHORTNESS OF BREATH (Patient taking differently: Inhale 2 puffs into the lungs every 6 (six) hours as needed for wheezing or shortness of breath. ) 8.5 Inhaler 2  . Psyllium (METAMUCIL PO) Take by mouth at  bedtime. gummies    . RESTASIS 0.05 % ophthalmic emulsion PLACE 1 DROP INTO BOTH EYES 2 (TWO) TIMES DAILY. USE ONCE DAILY (Patient taking differently: Place 1 drop into both eyes 2 (two) times daily. ) 60 mL 0  . rosuvastatin (CRESTOR) 20 MG tablet TAKE 1 TABLET BY MOUTH EVERY DAY 90 tablet 3  . warfarin (COUMADIN) 2 MG tablet Take 1 tablet (2 mg total) by mouth daily at 6 PM. 30 tablet 0  . Na Sulfate-K Sulfate-Mg Sulf 17.5-3.13-1.6 GM/177ML SOLN Take 1 kit by mouth once for 1 dose. 354 mL 0   No current facility-administered medications for this visit.   Allergies  Allergen Reactions  . Penicillins Anaphylaxis, Hives, Swelling and Rash    Has patient had a PCN reaction causing immediate rash, facial/tongue/throat swelling, SOB or lightheadedness with hypotension: Yes Has patient had a PCN reaction causing severe rash involving mucus membranes or skin necrosis: Yes Has patient had a PCN reaction that required hospitalization Yes Has patient had a PCN reaction occurring within the last 10 years: No If all of the above answers are "NO", then may proceed with Cephalosporin use.   . Cabbage Itching  . Keflex  [Cephalexin] Hives    And feeling of throat tightness  . Shellfish Allergy Swelling  . Tomato Swelling  . Latex Itching and Rash     Review of Systems: Positive for arthritis, back pain, cough,  night sweats, skin rash and urine leakage . All other systems reviewed and negative except where noted in HPI.   Creatinine clearance cannot be calculated (Patient's most recent lab result is older than the maximum 21 days allowed.)   Physical Exam:    Wt Readings from Last 3 Encounters:  02/26/19 255 lb 4 oz (115.8 kg)  01/09/19 263 lb 11.2 oz (119.6 kg)  12/31/18 267 lb 3.2 oz (121.2 kg)    BP 136/76   Pulse 80   Temp 98.4 F (36.9 C)   Ht 5' 1.5" (1.562 m)   Wt 255 lb 4 oz (115.8 kg)   BMI 47.45 kg/m  Constitutional:  Pleasant female in no acute distress. Psychiatric: Normal mood and affect. Behavior is normal. EENT: Pupils normal.  Conjunctivae are normal. No scleral icterus. Neck supple.  Cardiovascular: Normal rate, regular rhythm. No edema Pulmonary/chest: Effort normal and breath sounds normal. No wheezing, rales or rhonchi. Abdominal: Soft, nondistended, nontender. Bowel sounds active throughout. There are no masses palpable. No hepatomegaly. Neurological: Alert and oriented to person place and time. Skin: Skin is warm and dry. No rashes noted.  Tye Savoy, NP  02/26/2019, 3:08 PM  Cc:  Referring Provider Axel Filler

## 2019-02-26 NOTE — Telephone Encounter (Signed)
[facility] Pre-operative Risk Assessment     Request for surgical clearance:     Endoscopy Procedure  What type of surgery is being performed?     colonoscopy  When is this surgery scheduled?     Monday 04/01/19  What type of clearance is required ?   Pharmacy  Are there any medications that need to be held prior to surgery and how long? Coumadin 5 days  Practice name and name of physician performing surgery?      [facility]  What is your office phone and fax number?      Phone- [phone]  [phone]  Anesthesia type (None, local, MAC, general) ?       MAC

## 2019-02-26 NOTE — Patient Instructions (Addendum)
If you are age 72 or older, your body mass index should be between 23-30. Your Body mass index is 47.45 kg/m. If this is out of the aforementioned range listed, please consider follow up with your Primary Care Provider.  If you are age 102 or younger, your body mass index should be between 19-25. Your Body mass index is 47.45 kg/m. If this is out of the aformentioned range listed, please consider follow up with your Primary Care Provider.   You have been scheduled for a colonoscopy. Please follow written instructions given to you at your visit today.  Please pick up your prep supplies at the pharmacy within the next 1-3 days. If you use inhalers (even only as needed), please bring them with you on the day of your procedure.  You will be contaced by our office prior to your procedure for directions on holding your Coumadin/Warfarin.  If you do not hear from our office 1 week prior to your scheduled procedure, please call [phone] to discuss.

## 2019-02-27 NOTE — Telephone Encounter (Signed)
Hello Dr. Alexandria Lodge, Can you help coordinate with the patient and GI to hold her coumadin for this procedure? I appreciate all the help.  Thank you, Dr. Heide Spark

## 2019-02-27 NOTE — Telephone Encounter (Signed)
It appears that we do not manage this patient's coumadin. Will defer to internal medicine for coumadin for chronic DVT.

## 2019-02-27 NOTE — Telephone Encounter (Signed)
I s/w [facility] and informed them they will need to reach the PCP in regards to clearance for the coumadin as PCP is who manages coumadin. I will remove from the pre op call back pool.

## 2019-02-28 NOTE — Progress Notes (Signed)
Reviewed and agree with documentation and assessment and plan. K. Veena Janelie Goltz , MD   

## 2019-03-01 ENCOUNTER — Other Ambulatory Visit: Payer: Self-pay | Admitting: *Deleted

## 2019-03-01 NOTE — Telephone Encounter (Signed)
Called pt - stated her ophthalmologist prescribed Restatsis eye drops and she will call them herself.

## 2019-03-01 NOTE — Telephone Encounter (Signed)
I am not sure why she is on this medication. Is she following with an ophthalmologist who filled this for her?

## 2019-03-01 NOTE — Telephone Encounter (Signed)
Thank you :)

## 2019-03-01 NOTE — Telephone Encounter (Signed)
Next appt scheduled 03/19/19 with PCP. 

## 2019-03-04 NOTE — Progress Notes (Signed)
Cardiology Office Note Date:  03/05/2019  Patient ID:  Caroline [name], Caroline [name] 20-Mar-1945, MRN [id number] PCP:  Earl Lagos, MD  Electrophysiologist:  Dr. Elberta Fortis    Chief Complaint: annual EP visit  History of Present Illness: Caroline [name] is a 74 y.o. female with history of HTN, DM, PE, recurrent DVT, combined CHF, more recently recurrent diverticulitis.  She comes in today to be seen for Dr. Elberta Fortis, last seen by him in Feb 2020.  At that time she had prior c/o palpitations that were resolved without arrhythmias on monitoring.  She was euvolemic.  Had HF clinic follow up planned, with plans for annual visit.  I do not see HF visit.  She is feeling well.  No issues with diverticulitis, denies having any bleeding associated with this or otherwise.  She denies any CP, infrequently with feel a "cathin in the rhythm", this is moments.  No rest SOB or DOE with casual paced walking.  She will get winded when she walks faster the usual.   This is her baseline and unchanged She denies symptoms of PND or orthopnea. No dizzy spells, near syncope or syncope  Labs and warfarin are managed/monitored by her PMD   Past Medical History:  Diagnosis Date  . Arthritis   . Asthma   . Bronchitis   . CHF (congestive heart failure) ([facility])   . Diabetes ([facility]) 2019  . Diverticulitis   . DVT, lower extremity, recurrent ([facility])    Patient had unprovoked PE on 2002 and DVT in right lower extremety 2008.  [name] GERD (gastroesophageal reflux disease)   . History of kidney stones   . Hypertension   . PE (pulmonary embolism)    Patient had unprovoked PE on 2002  . PONV (postoperative nausea and vomiting)   . Vertigo     Past Surgical History:  Procedure Laterality Date  . ABDOMINAL HYSTERECTOMY     1980  . CYSTOSCOPY W/ URETERAL STENT PLACEMENT Right 09/16/2015   Procedure: CYSTOSCOPY WITH RETROGRADE PYELOGRAM/URETERAL STENT PLACEMENT;  Surgeon: Alfredo Martinez, MD;  Location[facility];  Service: Urology;   Laterality: Right;  . CYSTOSCOPY WITH RETROGRADE PYELOGRAM, URETEROSCOPY AND STENT PLACEMENT Right 12/11/2015   Procedure: RIGHT URETEROSCOPY/HOLMIUM LASER LITHOTRIPSY AND STONE REMOVAL removal and placement of double j stent;  Surgeon: Crist Fat, MD;  Location[facility];  Service: Urology;  Laterality: Right;  . HOLMIUM LASER APPLICATION Right 12/11/2015   Procedure: HOLMIUM LASER APPLICATION;  Surgeon: Crist Fat, MD;  Location[facility];  Service: Urology;  Laterality: Right;    Current Outpatient Medications  Medication Sig Dispense Refill  . budesonide-formoterol (SYMBICORT) 160-4.5 MCG/ACT inhaler Inhale 2 puffs into the lungs 2 (two) times daily. 3 Inhaler 4  . calcium citrate-vitamin D (CITRACAL+D) 315-200 MG-UNIT tablet TAKE 2 TABLETS DAILY BY MOUTH. (Patient taking differently: Take 2 tablets by mouth daily. ) 160 tablet 2  . carvedilol (COREG) 12.5 MG tablet Take 1 tablet (12.5 mg total) by mouth 2 (two) times daily with a meal. Please keep upcoming appt in February for future refills. Thank you 60 tablet 0  . EPINEPHrine 0.3 mg/0.3 mL IJ SOAJ injection Inject 0.3 mg into the muscle as needed. Severe allergic reactions  0  . fluticasone (FLONASE) 50 MCG/ACT nasal spray PLACE 2 SPRAYS DAILY INTO BOTH NOSTRILS. 48 mL 1  . hydrocortisone (PROCTOSOL HC) 2.5 % rectal cream Place 1 application rectally 2 (two) times daily. 30 g 0  . meclizine (ANTIVERT) 25 MG tablet  TAKE 1 TABLET BY MOUTH THREE TIMES A DAY AS NEEDED 90 tablet 1  . metFORMIN (GLUCOPHAGE-XR) 500 MG 24 hr tablet TAKE 1 TABLET BY MOUTH EVERY DAY WITH BREAKFAST 90 tablet 1  . montelukast (SINGULAIR) 10 MG tablet Take 1 tablet (10 mg total) by mouth at bedtime. 90 tablet 3  . pantoprazole (PROTONIX) 40 MG tablet TAKE 1 TABLET BY MOUTH EVERY DAY 90 tablet 1  . PROAIR HFA 108 (90 Base) MCG/ACT inhaler TAKE 2 PUFFS BY MOUTH EVERY 6 HOURS AS NEEDED FOR WHEEZE OR SHORTNESS OF BREATH (Patient taking differently: Inhale 2 puffs  into the lungs every 6 (six) hours as needed for wheezing or shortness of breath. ) 8.5 Inhaler 2  . Psyllium (METAMUCIL PO) Take by mouth at bedtime. gummies    . RESTASIS 0.05 % ophthalmic emulsion PLACE 1 DROP INTO BOTH EYES 2 (TWO) TIMES DAILY. USE ONCE DAILY (Patient taking differently: Place 1 drop into both eyes 2 (two) times daily. ) 60 mL 0  . rosuvastatin (CRESTOR) 20 MG tablet TAKE 1 TABLET BY MOUTH EVERY DAY 90 tablet 3  . warfarin (COUMADIN) 2 MG tablet Take 1 tablet (2 mg total) by mouth daily at 6 PM. 30 tablet 0   No current facility-administered medications for this visit.    Allergies:   Penicillins, Cabbage, Keflex [cephalexin], Shellfish allergy, Tomato, and Latex   Social History:  The patient  reports that she quit smoking about 30 years ago. Her smoking use included cigarettes. She has never used smokeless tobacco. She reports that she does not drink alcohol or use drugs.   Family History:  The patient's family history includes CVA in her father; Cancer in her brother and mother; Colon cancer in an other family member; Diabetes in her brother, sister, sister, sister, sister, son, and son; Heart Problems (age of onset: 42) in an other family member; Hypertension in her brother, daughter, sister, sister, sister, sister, son, and son; Kidney disease in her son; Schizophrenia in her daughter.  ROS:  Please see the history of present illness.  All other systems are reviewed and otherwise negative.   PHYSICAL EXAM:  VS:  BP 138/76   Pulse 67   Ht 5' 1.5" (1.562 m)   Wt 256 lb (116.1 kg)   BMI 47.59 kg/m  BMI: Body mass index is 47.59 kg/m. Well nourished, well developed, in no acute distress  HEENT: normocephalic, atraumatic  Neck: no JVD, carotid bruits or masses Cardiac:  RRR; no significant murmurs, no rubs, or gallops Lungs:  CTA b/l, no wheezing, rhonchi or rales  Abd: soft, nontender, obese MS: no deformity, age appropriate atrophy Ext: obese, no pitting edema  is appreciated  Skin: warm and dry, no rash Neuro:  No gross deficits appreciated Psych: euthymic mood, full affect     EKG:  Done today and reviewed by myself shows  SR 67bpm, RBBB, LAD, unchanged   07/2016 30 day Monitor Sinus rhythm, no arrhythmias noted   09/21/2015: TTE Study Conclusions  - Left ventricle: The cavity size was normal. Systolic function was  mildly reduced. The estimated ejection fraction was in the range  of 45% to 50%. Wall motion was normal; there were no regional  wall motion abnormalities. Features are consistent with a  pseudonormal left ventricular filling pattern, with concomitant  abnormal relaxation and increased filling pressure (grade 2  diastolic dysfunction).  - Left atrium: The atrium was moderately dilated.   Impressions:  - There was no evidence of  a vegetation.     03/14/2005: stress myoview IMPRESSION:   No evidence of exercise-induced myocardial ischemia. Mild apical thinning.   Left ventricular ejection fraction is 72 percent.        Recent Labs: 01/01/2019: ALT 11; Magnesium 2.2 01/03/2019: BUN 9; Creatinine, Ser 0.95; Potassium 4.2; Sodium 145 01/04/2019: Hemoglobin 9.2; Platelets 203  No results found for requested labs within last 8760 hours.   CrCl cannot be calculated (Patient's most recent lab result is older than the maximum 21 days allowed.).   Wt Readings from Last 3 Encounters:  03/05/19 256 lb (116.1 kg)  02/26/19 255 lb 4 oz (115.8 kg)  01/09/19 263 lb 11.2 oz (119.6 kg)     Other studies reviewed: Additional studies/records reviewed today include: summarized above  ASSESSMENT AND PLAN:  1.Chronic combined CHF    Her losartan and lasix were stopped at the last hospital stay it seems 2/2 hypokalemia    Will update her echo, BMET today    If LV looks OK, will keep off the ARB, otherwise may add back    No symptoms or exam fidnings to suggest volume OL currently   2. HTN     No changes  today   Disposition: F/u in 29mo, sooner if needed  Current medicines are reviewed at length with the patient today.  The patient did not have any concerns regarding medicines.  Norma Fredrickson, PA-C 03/05/2019 1:16 PM     [facility] [address] [place] [address] [phone] (office)  [phone] (fax)

## 2019-03-05 ENCOUNTER — Other Ambulatory Visit: Payer: Self-pay

## 2019-03-05 ENCOUNTER — Ambulatory Visit (INDEPENDENT_AMBULATORY_CARE_PROVIDER_SITE_OTHER): Payer: Medicare Other | Admitting: Physician Assistant

## 2019-03-05 VITALS — BP 138/76 | HR 67 | Ht 61.5 in | Wt 256.0 lb

## 2019-03-05 DIAGNOSIS — Z79899 Other long term (current) drug therapy: Secondary | ICD-10-CM

## 2019-03-05 DIAGNOSIS — I10 Essential (primary) hypertension: Secondary | ICD-10-CM

## 2019-03-05 DIAGNOSIS — I5042 Chronic combined systolic (congestive) and diastolic (congestive) heart failure: Secondary | ICD-10-CM

## 2019-03-05 DIAGNOSIS — R002 Palpitations: Secondary | ICD-10-CM | POA: Diagnosis not present

## 2019-03-05 NOTE — Patient Instructions (Addendum)
Medication Instructions:   Your physician recommends that you continue on your current medications as directed. Please refer to the Current Medication list given to you today. *If you need a refill on your cardiac medications before your next appointment, please call your pharmacy*  Lab Work:  BMET  Today   If you have labs (blood work) drawn today and your tests are completely normal, you will receive your results only by: [name] MyChart Message (if you have MyChart) OR . A paper copy in the mail If you have any lab test that is abnormal or we need to change your treatment, we will call you to review the results.  Testing/Procedures: Your physician has requested that you have an echocardiogram. Echocardiography is a painless test that uses sound waves to create images of your heart. It provides your doctor with information about the size and shape of your heart and how well your heart's chambers and valves are working. This procedure takes approximately one hour. There are no restrictions for this procedure.  Follow-Up: At [facility], you and your health needs are our priority.  As part of our continuing mission to provide you with exceptional heart care, we have created designated Provider Care Teams.  These Care Teams include your primary Cardiologist (physician) and Advanced Practice Providers (APPs -  Physician Assistants and Nurse Practitioners) who all work together to provide you with the care you need, when you need it.  Your next appointment:   6 month(s)  The format for your next appointment:   In Person  Provider:   You may see Dr. Elberta Fortis  or one of the following Advanced Practice Providers on your designated Care Team:    Gypsy Balsam, NP  Francis Dowse, PA-C  Casimiro Needle "Otilio Saber, [place]   Other Instructions

## 2019-03-06 LAB — BASIC METABOLIC PANEL
BUN/Creatinine Ratio: 22 (ref 12–28)
BUN: 16 mg/dL (ref 8–27)
CO2: 27 mmol/L (ref 20–29)
Calcium: 10 mg/dL (ref 8.7–10.3)
Chloride: 103 mmol/L (ref 96–106)
Creatinine, Ser: 0.74 mg/dL (ref 0.57–1.00)
GFR calc Af Amer: 94 mL/min/{1.73_m2} (ref >59)
GFR calc non Af Amer: 81 mL/min/{1.73_m2} (ref >59)
Glucose: 86 mg/dL (ref 65–99)
Potassium: 4.5 mmol/L (ref 3.5–5.2)
Sodium: 145 mmol/L — ABNORMAL HIGH (ref 134–144)

## 2019-03-11 ENCOUNTER — Ambulatory Visit (INDEPENDENT_AMBULATORY_CARE_PROVIDER_SITE_OTHER): Payer: Medicare Other | Admitting: Pharmacist

## 2019-03-11 DIAGNOSIS — Z7901 Long term (current) use of anticoagulants: Secondary | ICD-10-CM

## 2019-03-11 DIAGNOSIS — Z5181 Encounter for therapeutic drug level monitoring: Secondary | ICD-10-CM | POA: Diagnosis not present

## 2019-03-11 DIAGNOSIS — I82409 Acute embolism and thrombosis of unspecified deep veins of unspecified lower extremity: Secondary | ICD-10-CM

## 2019-03-11 LAB — POCT INR: INR: 2 (ref 2.0–3.0)

## 2019-03-11 NOTE — Progress Notes (Signed)
Anticoagulation Management Caroline [name] is a 74 y.o. female who reports to the clinic for monitoring of warfarin treatment.    Indication: DVT, History of recurrent; Long term current use of anticoagulant.  Duration: indefinite Supervising physician: Blanch Media, MD  Anticoagulation Clinic Visit History: Patient does not report signs/symptoms of bleeding or thromboembolism  Other recent changes: No diet, medications, lifestyle changes.  Anticoagulation Episode Summary    Current INR goal:  2.0-3.0  TTR:  80.6 % (4.1 y)  Next INR check:  03/25/2019  INR from last check:  2.0 (03/11/2019)  Weekly max warfarin dose:    Target end date:    INR check location:  Anticoagulation Clinic  Preferred lab:    Send INR reminders to:  ANTICOAG [facility]   Indications   DVT lower extremity recurrent (HCC) [I82.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 Penicillins Anaphylaxis, Hives, Swelling and Rash    Has patient had a PCN reaction causing immediate rash, facial/tongue/throat swelling, SOB or lightheadedness with hypotension: Yes Has patient had a PCN reaction causing severe rash involving mucus membranes or skin necrosis: Yes Has patient had a PCN reaction that required hospitalization Yes Has patient had a PCN reaction occurring within the last 10 years: No If all of the above answers are "NO", then may proceed with Cephalosporin use.   . Cabbage Itching  . Keflex [Cephalexin] Hives    And feeling of throat tightness  . Shellfish Allergy Swelling  . Tomato Swelling  . Latex Itching and Rash    Current Outpatient Medications:  .  budesonide-formoterol (SYMBICORT) 160-4.5 MCG/ACT inhaler, Inhale 2 puffs into the lungs 2 (two) times daily., Disp: 3 Inhaler, Rfl: 4 .  calcium citrate-vitamin D (CITRACAL+D) 315-200 MG-UNIT tablet, TAKE 2 TABLETS DAILY BY MOUTH. (Patient taking differently: Take 2 tablets by mouth  daily. ), Disp: 160 tablet, Rfl: 2 .  carvedilol (COREG) 12.5 MG tablet, Take 1 tablet (12.5 mg total) by mouth 2 (two) times daily with a meal. Please keep upcoming appt in February for future refills. Thank you, Disp: 60 tablet, Rfl: 0 .  EPINEPHrine 0.3 mg/0.3 mL IJ SOAJ injection, Inject 0.3 mg into the muscle as needed. Severe allergic reactions, Disp: , Rfl: 0 .  fluticasone (FLONASE) 50 MCG/ACT nasal spray, PLACE 2 SPRAYS DAILY INTO BOTH NOSTRILS., Disp: 48 mL, Rfl: 1 .  hydrocortisone (PROCTOSOL HC) 2.5 % rectal cream, Place 1 application rectally 2 (two) times daily., Disp: 30 g, Rfl: 0 .  meclizine (ANTIVERT) 25 MG tablet, TAKE 1 TABLET BY MOUTH THREE TIMES A DAY AS NEEDED, Disp: 90 tablet, Rfl: 1 .  metFORMIN (GLUCOPHAGE-XR) 500 MG 24 hr tablet, TAKE 1 TABLET BY MOUTH EVERY DAY WITH BREAKFAST, Disp: 90 tablet, Rfl: 1 .  montelukast (SINGULAIR) 10 MG tablet, Take 1 tablet (10 mg total) by mouth at bedtime., Disp: 90 tablet, Rfl: 3 .  pantoprazole (PROTONIX) 40 MG tablet, TAKE 1 TABLET BY MOUTH EVERY DAY, Disp: 90 tablet, Rfl: 1 .  PROAIR HFA 108 (90 Base) MCG/ACT inhaler, TAKE 2 PUFFS BY MOUTH EVERY 6 HOURS AS NEEDED FOR WHEEZE OR SHORTNESS OF BREATH (Patient taking differently: Inhale 2 puffs into the lungs every 6 (six) hours as needed for wheezing or shortness of breath. ), Disp: 8.5 Inhaler, Rfl: 2 .  Psyllium (METAMUCIL PO), Take by mouth at bedtime. gummies, Disp: , Rfl:  .  RESTASIS 0.05 % ophthalmic emulsion, PLACE 1 DROP INTO BOTH EYES 2 (TWO) TIMES DAILY. USE ONCE DAILY (Patient taking differently: Place 1 drop into both eyes 2 (two) times daily. ), Disp: 60 mL, Rfl: 0 .  rosuvastatin (CRESTOR) 20 MG tablet, TAKE 1 TABLET BY MOUTH EVERY DAY, Disp: 90 tablet, Rfl: 3 .  warfarin (COUMADIN) 2 MG tablet, Take 1 tablet (2 mg total) by mouth daily at 6 PM., Disp: 30 tablet, Rfl: 0 Past Medical History:  Diagnosis Date  . Arthritis   . Asthma   . Bronchitis   . CHF (congestive heart  failure) (HCC)   . Diabetes (HCC) 2019  . Diverticulitis   . DVT, lower extremity, recurrent (HCC)    Patient had unprovoked PE on 2002 and DVT in right lower extremety 2008.  [name] GERD (gastroesophageal reflux disease)   . History of kidney stones   . Hypertension   . PE (pulmonary embolism)    Patient had unprovoked PE on 2002  . PONV (postoperative nausea and vomiting)   . Vertigo    Social History   Socioeconomic History  . Marital status: Divorced    Spouse name: Not on file  . Number of children: 5  . Years of education: 9 th grade  . Highest education level: 9th grade  Occupational History  . Occupation: retired  Tobacco Use  . Smoking status: Former Smoker    Types: Cigarettes    Quit date: 09/18/1988    Years since quitting: 30.4  . Smokeless tobacco: Never Used  . Tobacco comment: 6 pack year smoking history as a teen  Substance and Sexual Activity  . Alcohol use: No  . Drug use: No  . Sexual activity: Never    Birth control/protection: None  Other Topics Concern  . Not on file  Social History Narrative   Current Social History 05/17/2017        Patient lives with 34 yo granddaughter" in a/an home / condo / townhome which is 1 story/stories. There are not steps up to the entrance the patient uses.       Patient's method of transportation is church member.      The highest level of education was some high school.      The patient currently retired.      Identified important Relationships are God, family       Pets : 1 lab/pitt mix named Cinnamon       Interests / Fun: Church,TV       Current Stressors: "Me and my granddaughter"       Religious / Personal Beliefs: I'm Holiness and I love people"       Other: "I'd give away my last dime."    Social Determinants of Health   Financial Resource Strain:   . Difficulty of Paying Living Expenses: Not on file  Food Insecurity:   . Worried About Programme researcher, broadcasting/film/video in the Last Year: Not on file  . Ran Out of  Food in the Last Year: Not on file  Transportation Needs:   . Lack of Transportation (Medical): Not on file  . Lack of Transportation (Non-Medical): Not on file  Physical Activity:   . Days of Exercise per Week: Not on file  . Minutes of Exercise per Session: Not on file  Stress:   . Feeling of Stress : Not on file  Social Connections:   . Frequency of Communication with Friends and Family: Not on file  .  Frequency of Social Gatherings with Friends and Family: Not on file  . Attends Religious Services: Not on file  . Active Member of Clubs or Organizations: Not on file  . Attends Archivist Meetings: Not on file  . Marital Status: Not on file   Family History  Problem Relation Age of Onset  . Cancer Mother   . Hypertension Sister   . Diabetes Sister   . Hypertension Brother   . Diabetes Brother   . Hypertension Sister   . Diabetes Sister   . Colon cancer Other   . Heart Problems Other 34       sister's child, open heart surgery  . CVA Father   . Diabetes Son   . Hypertension Son   . Kidney disease Son        on dialysis  . Hypertension Sister   . Diabetes Sister   . Hypertension Sister   . Diabetes Sister   . Cancer Brother   . Hypertension Son   . Diabetes Son   . Hypertension Daughter   . Schizophrenia Daughter     ASSESSMENT Recent Results: The most recent result is correlated with 20 mg per week: Lab Results  Component Value Date   INR 2.0 03/11/2019   INR 2.3 02/11/2019   INR 2.4 01/21/2019    Anticoagulation Dosing: Description   Take one (1) tablet on Sundays, Tuesdays, Thursdays and Saturdays. All other days, take only 1/2 tablet.     INR today: Therapeutic  PLAN Weekly dose was increased by 10% to 22 mg per week  Patient Instructions  Patient instructed to take medications as defined in the Anti-coagulation Track section of this encounter.  Patient instructed to take today's dose.  Patient instructed to take one (1) tablet on  Sundays, Tuesdays, Thursdays and Saturdays; all other days, take only one-half (1/2) tablet.  Patient verbalized understanding of these instructions.    Patient advised to contact clinic or seek medical attention if signs/symptoms of bleeding or thromboembolism occur.  Patient verbalized understanding by repeating back information and was advised to contact me if further medication-related questions arise. Patient was also provided an information handout.  Follow-up Return in about 2 weeks (around 03/25/2019) for Follow up INR.  Pennie Banter, PharmD, CPP  15 minutes spent face-to-face with the patient during the encounter. 50% of time spent on education, including signs/sx bleeding and clotting, as well as food and drug interactions with warfarin. 50% of time was spent on fingerprick POC INR sample collection,processing, results determination, and documentation in http://www.kim.net/.

## 2019-03-11 NOTE — Patient Instructions (Signed)
Patient instructed to take medications as defined in the Anti-coagulation Track section of this encounter.  Patient instructed to take today's dose.  Patient instructed to take one (1) tablet on Sundays, Tuesdays, Thursdays and Saturdays; all other days, take only one-half (1/2) tablet.  Patient verbalized understanding of these instructions.

## 2019-03-19 ENCOUNTER — Encounter: Payer: Self-pay | Admitting: Internal Medicine

## 2019-03-19 ENCOUNTER — Ambulatory Visit (INDEPENDENT_AMBULATORY_CARE_PROVIDER_SITE_OTHER): Payer: Medicare Other | Admitting: Internal Medicine

## 2019-03-19 VITALS — BP 144/60 | HR 65 | Temp 98.4°F | Ht 61.2 in | Wt 257.7 lb

## 2019-03-19 DIAGNOSIS — I110 Hypertensive heart disease with heart failure: Secondary | ICD-10-CM

## 2019-03-19 DIAGNOSIS — Z8719 Personal history of other diseases of the digestive system: Secondary | ICD-10-CM

## 2019-03-19 DIAGNOSIS — Z8639 Personal history of other endocrine, nutritional and metabolic disease: Secondary | ICD-10-CM

## 2019-03-19 DIAGNOSIS — Z6841 Body Mass Index (BMI) 40.0 and over, adult: Secondary | ICD-10-CM

## 2019-03-19 DIAGNOSIS — Z86711 Personal history of pulmonary embolism: Secondary | ICD-10-CM

## 2019-03-19 DIAGNOSIS — J4530 Mild persistent asthma, uncomplicated: Secondary | ICD-10-CM | POA: Diagnosis not present

## 2019-03-19 DIAGNOSIS — R1031 Right lower quadrant pain: Secondary | ICD-10-CM | POA: Diagnosis not present

## 2019-03-19 DIAGNOSIS — Z7901 Long term (current) use of anticoagulants: Secondary | ICD-10-CM

## 2019-03-19 DIAGNOSIS — I5042 Chronic combined systolic (congestive) and diastolic (congestive) heart failure: Secondary | ICD-10-CM | POA: Diagnosis not present

## 2019-03-19 DIAGNOSIS — Z7951 Long term (current) use of inhaled steroids: Secondary | ICD-10-CM

## 2019-03-19 DIAGNOSIS — J4521 Mild intermittent asthma with (acute) exacerbation: Secondary | ICD-10-CM

## 2019-03-19 DIAGNOSIS — F329 Major depressive disorder, single episode, unspecified: Secondary | ICD-10-CM

## 2019-03-19 DIAGNOSIS — Z79899 Other long term (current) drug therapy: Secondary | ICD-10-CM

## 2019-03-19 DIAGNOSIS — I10 Essential (primary) hypertension: Secondary | ICD-10-CM

## 2019-03-19 DIAGNOSIS — R1030 Lower abdominal pain, unspecified: Secondary | ICD-10-CM

## 2019-03-19 DIAGNOSIS — Z7984 Long term (current) use of oral hypoglycemic drugs: Secondary | ICD-10-CM

## 2019-03-19 DIAGNOSIS — R42 Dizziness and giddiness: Secondary | ICD-10-CM

## 2019-03-19 DIAGNOSIS — E118 Type 2 diabetes mellitus with unspecified complications: Secondary | ICD-10-CM

## 2019-03-19 DIAGNOSIS — I82409 Acute embolism and thrombosis of unspecified deep veins of unspecified lower extremity: Secondary | ICD-10-CM

## 2019-03-19 DIAGNOSIS — Z86718 Personal history of other venous thrombosis and embolism: Secondary | ICD-10-CM

## 2019-03-19 DIAGNOSIS — E876 Hypokalemia: Secondary | ICD-10-CM

## 2019-03-19 LAB — POCT GLYCOSYLATED HEMOGLOBIN (HGB A1C): Hemoglobin A1C: 5.5 % (ref 4.0–5.6)

## 2019-03-19 LAB — GLUCOSE, CAPILLARY: Glucose-Capillary: 101 mg/dL — ABNORMAL HIGH (ref 70–99)

## 2019-03-19 MED ORDER — RESTASIS 0.05 % OP EMUL: 1.0000 [drp] | Freq: Two times a day (BID) | OPHTHALMIC | 1 refills | Status: DC

## 2019-03-19 MED ORDER — MONTELUKAST SODIUM 10 MG PO TABS: 10.0000 mg | ORAL_TABLET | Freq: Every day | ORAL | 3 refills | Status: DC

## 2019-03-19 NOTE — Assessment & Plan Note (Signed)
-  This problem is chronic and stable -Patient states that she is doing well with her maintenance inhaler (Symbicort) as well as Singulair and only uses her albuterol as needed which is rarely -On exam her lungs were clear to auscultation and she had no wheezes -No further work-up at this time -We will refill Singulair for her

## 2019-03-19 NOTE — Assessment & Plan Note (Signed)
-  Patient has a history of recurrent diverticulitis for which she followed up with GI -Patient does complain of some mild intermittent right lower quadrant abdominal pain today but has no fevers or chills and no tenderness to palpation on abdominal exam.  Currently the patient has no pain and patient states that when the pain it is much milder than when compared to her diverticulitis pain. -Patient to have a colonoscopy later this month with GI -No further work-up at this time

## 2019-03-19 NOTE — Assessment & Plan Note (Signed)
-  Patient hypokalemia has resolved off of Lasix -Her last potassium was 4.5 -No further work-up at this time

## 2019-03-19 NOTE — Assessment & Plan Note (Signed)
-  This problem is chronic and stable -Patient's weight is down to 257 pounds from 270 pounds in November of last year -Patient continues to diet and exercise and try to lose weight -I congratulated the patient on her weight loss and suspect that this is the reason that her A1c has continued to improve -Patient wants to continue to try and lose weight and I encouraged her to do this.  We will follow up with this at her next visit

## 2019-03-19 NOTE — Assessment & Plan Note (Signed)
Lab Results  Component Value Date   HGBA1C 5.5 03/19/2019   HGBA1C 6.1 (A) 03/26/2018   HGBA1C 6.6 (A) 12/11/2017     Assessment: Diabetes control:  Well-controlled Progress toward A1C goal:   At goal Comments: Patient is compliant with Metformin 500 mg daily.  However, given that her A1c is only 5.5 I am in favor of stopping her Metformin and having her follow-up in 3 months for recheck of her A1c.  Patient did state that she did have one episode of hypoglycemic-like symptoms in the last couple of weeks.  Plan: Medications:  continue current medications Home glucose monitoring: Frequency:   Timing:   Instruction/counseling given: discussed the need for weight loss Educational resources provided:   Self management tools provided:   Other plans: We will check BMP at follow-up visit

## 2019-03-19 NOTE — Progress Notes (Signed)
   Subjective:    Patient ID: Caroline [name], female    DOB: 03-31-1945, 74 y.o.   MRN: [id number]  HPI  I have seen and examined this patient.  Patient is here for routine follow-up of her hypertension and asthma.  Patient states that she feels well today and has no new complaints.  She does have some mild pain in her right lower quadrant but this is intermittent and comes and goes and is not severe like her diverticulitis pain.  Patient states that she is compliant with her medications.  Review of Systems  Constitutional: Negative.   HENT: Negative.   Respiratory: Negative.   Cardiovascular: Negative.   Gastrointestinal: Negative for abdominal distention.       Patient with some mild intermittent right lower quadrant pain  Musculoskeletal: Negative.   Skin: Negative.   Neurological: Negative.   Psychiatric/Behavioral: Negative.        Objective:   Physical Exam HENT:     Head: Normocephalic and atraumatic.  Cardiovascular:     Rate and Rhythm: Normal rate and regular rhythm.     Heart sounds: Normal heart sounds.  Pulmonary:     Effort: Pulmonary effort is normal.     Breath sounds: Normal breath sounds. No wheezing or rales.  Abdominal:     General: Bowel sounds are normal. There is no distension.     Palpations: Abdomen is soft.     Tenderness: There is no abdominal tenderness.     Comments: Patient with no tenderness to palpation even in her right lower quadrant.  No rebound/no guarding  Musculoskeletal:        General: No swelling or tenderness.     Cervical back: Neck supple.  Lymphadenopathy:     Cervical: No cervical adenopathy.  Neurological:     General: No focal deficit present.     Mental Status: She is alert and oriented to person, place, and time.  Psychiatric:        Mood and Affect: Mood normal.        Behavior: Behavior normal.           Assessment & Plan:  Please see problem based charting for assessment and plan:

## 2019-03-19 NOTE — Assessment & Plan Note (Signed)
BP Readings from Last 3 Encounters:  03/19/19 (!) 144/60  03/05/19 138/76  02/26/19 136/76    Lab Results  Component Value Date   NA 145 (H) 03/05/2019   K 4.5 03/05/2019   CREATININE 0.74 03/05/2019    Assessment: Blood pressure control:  Fair Progress toward BP goal:   Near goal Comments: Patient is compliant with carvedilol 12.5 mg twice daily  Plan: Medications:  continue current medications Educational resources provided:   Self management tools provided:   Other plans: Patient had her Lasix and losartan held at her last hospitalization.  She is following with cardiology currently and they will restart her losartan if her 2D echo shows decreased LV systolic function

## 2019-03-19 NOTE — Assessment & Plan Note (Signed)
-  This problem is chronic and stable -Patient states that if she does not take her meclizine she gets recurrent episodes of vertigo -I explained the patient that we could refer her to vestibular PT to see if that would help with her symptoms and that we could try and take her off this medication -However, patient states that she went for this approximately 8 years ago and did not have any benefit for her and she would like to continue with meclizine at this time -No further work-up for now.  We will continue with meclizine

## 2019-03-19 NOTE — Assessment & Plan Note (Signed)
-  Patient has a history of recurrent DVTs and PEs and is on chronic anticoagulation -Patient follows up with Dr. Alexandria Lodge for her Coumadin. -Her last INR was 2 on March 1 and she had her Coumadin dose increased -Patient will have her GI doctor coordinate with Dr. Alexandria Lodge prior to her colonoscopy to hold her Coumadin for the procedure

## 2019-03-19 NOTE — Assessment & Plan Note (Signed)
-  This problem is chronic and stable -Patient denies any shortness of breath or weight gain.  In fact, patient has been losing weight with diet and exercise -Patient has no lower extremity edema today and lungs are clear to auscultation -Patient is now off Lasix and losartan.  We will continue with carvedilol for now -Patient is scheduled for repeat 2D echo with cardiology.  Cardiology will reinitiate losartan if her LV systolic function is decreased -No further work-up at this time

## 2019-03-19 NOTE — Assessment & Plan Note (Signed)
-  This problem is chronic and stable -Patient states that her depressive symptoms have improved -She followed up with Caroline [name] once after her last visit but states that she does not need to follow-up currently and that she feels well -Patient's PHQ 9 score today 0.  This is down from 4 at her prior visit -No further work-up at this time.  We will continue to monitor off medication

## 2019-03-19 NOTE — Patient Instructions (Addendum)
-  It was a pleasure seeing you today -Your A1c is at goal.  We will stop your Metformin for now and recheck your A1c at your follow-up visit. -You are doing great job with weight loss.  Please continue to diet and exercise and try to lose more weight. -Please follow-up with your GI doctor for colonoscopy -We will continue with the current medications for your hypertension.  We may add back one of your medications after your heart ultrasound. -We will check a urine today -Please call me with any questions or concerns -We will recheck your blood work at your follow-up visit

## 2019-03-20 LAB — MICROALBUMIN / CREATININE URINE RATIO
Creatinine, Urine: 19.7 mg/dL
Microalb/Creat Ratio: <15 mg/g creat (ref 0–29)
Microalbumin, Urine: <3 ug/mL

## 2019-03-21 ENCOUNTER — Other Ambulatory Visit: Payer: Self-pay

## 2019-03-21 ENCOUNTER — Telehealth: Payer: Self-pay | Admitting: Internal Medicine

## 2019-03-21 ENCOUNTER — Encounter: Payer: Self-pay | Admitting: Internal Medicine

## 2019-03-21 ENCOUNTER — Ambulatory Visit (INDEPENDENT_AMBULATORY_CARE_PROVIDER_SITE_OTHER): Payer: Medicare Other | Admitting: Internal Medicine

## 2019-03-21 DIAGNOSIS — R350 Frequency of micturition: Secondary | ICD-10-CM | POA: Diagnosis not present

## 2019-03-21 DIAGNOSIS — I5042 Chronic combined systolic (congestive) and diastolic (congestive) heart failure: Secondary | ICD-10-CM | POA: Diagnosis not present

## 2019-03-21 HISTORY — DX: Frequency of micturition: R35.0

## 2019-03-21 NOTE — Telephone Encounter (Addendum)
Returned call to patient. States she received call from [facility] Medicare this morning who stated they would cover [facility] if they received authorization. Patient states she used a wide BSC in hospital during 12/2018 admission. Patient's weight is 257 lbs. Standard BSC goes up to 300 lbs. Patient will not fit in standard BSC. Call placed to [facility] Medicare [phone]. Spoke with Carney Bern who states patient has a 20% co-insurance for all DME. Is not able to state if this will include bariatric BSC.  Patient given [facility] appt today for telehealth visit to discuss need for [facility]. Kinnie Feil, BSN, RN-BC

## 2019-03-21 NOTE — Telephone Encounter (Signed)
Pt is requesting a bedside toilet chair, pls contact [phone]

## 2019-03-21 NOTE — Progress Notes (Addendum)
  [facility] Internal Medicine Residency Telephone Encounter Continuity Care Appointment  HPI:   This telephone encounter was created for [name] on 03/21/2019 for the following purpose/cc frequent urination.   Past Medical History:  Past Medical History:  Diagnosis Date  . Arthritis   . Asthma   . Bronchitis   . CHF (congestive heart failure) (HCC)   . Diabetes (HCC) 2019  . Diverticulitis   . DVT, lower extremity, recurrent (HCC)    Patient had unprovoked PE on 2002 and DVT in right lower extremety 2008.  [name] GERD (gastroesophageal reflux disease)   . History of kidney stones   . Hypertension   . PE (pulmonary embolism)    Patient had unprovoked PE on 2002  . PONV (postoperative nausea and vomiting)   . Vertigo       ROS:   ROS negative except as per HPI.   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 Although her medical conditions are stable she requires continuous therapy which will result in continue frequent urination.  Plan: DME placed for Bariatric BSC to assist with frequent urination  Due to her short stature and weight she has a BMI of 48 and a very wide girth that prohibits use of a regular BSC. The later of which is particularly hazardous for her to utilize as it is a trip hazard    Assessment / Plan / Recommendations:   Please see A&P under problem oriented charting for assessment of the patient's acute and chronic medical conditions.   As always, pt is  advised that if symptoms worsen or new symptoms arise, they should go to an urgent care facility or to to ER for further evaluation.   Consent and Medical Decision Making:   Patient discussed with Dr. Heide Spark  This is a telephone encounter between Caroline [name] and [employer] on 03/21/2019 for frequent urination. The visit was conducted with the patient located at home and [employer] at [facility]. The patient's identity was confirmed using their DOB and current address. The patient has consented to being evaluated through a telephone encounter and understands the associated risks (an examination cannot be done and the patient may need to come in for an appointment) / benefits (allows the patient to remain at home, decreasing exposure to coronavirus). I personally spent 11 minutes on medical discussion.

## 2019-03-21 NOTE — Assessment & Plan Note (Addendum)
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 Although her medical conditions are stable she requires continuous therapy which will result in continue frequent urination.  Plan: DME placed for Bariatric BSC to assist with frequent urination  Due to her short stature and weight she has a BMI of 48 and a very wide girth that prohibits use of a regular BSC. The later of which is particularly hazardous for her to utilize as it is a trip hazard

## 2019-03-21 NOTE — Telephone Encounter (Signed)
Community message sent to Karle Plumber at [facility] for Bariatric [facility]. Kinnie Feil, BSN, RN-BC

## 2019-03-21 NOTE — Assessment & Plan Note (Signed)
See Combined heart failure from same date.

## 2019-03-22 NOTE — Progress Notes (Signed)
Internal Medicine Clinic Attending  Case discussed with Dr. Harbrecht at the time of the visit.  We reviewed the resident's history and exam and pertinent patient test results.  I agree with the assessment, diagnosis, and plan of care documented in the resident's note.   

## 2019-03-24 ENCOUNTER — Other Ambulatory Visit: Payer: Self-pay | Admitting: Cardiology

## 2019-03-24 DIAGNOSIS — I10 Essential (primary) hypertension: Secondary | ICD-10-CM

## 2019-03-25 ENCOUNTER — Ambulatory Visit (INDEPENDENT_AMBULATORY_CARE_PROVIDER_SITE_OTHER): Payer: Medicare Other

## 2019-03-25 DIAGNOSIS — Z86711 Personal history of pulmonary embolism: Secondary | ICD-10-CM

## 2019-03-25 DIAGNOSIS — Z5181 Encounter for therapeutic drug level monitoring: Secondary | ICD-10-CM

## 2019-03-25 DIAGNOSIS — Z86718 Personal history of other venous thrombosis and embolism: Secondary | ICD-10-CM | POA: Diagnosis not present

## 2019-03-25 DIAGNOSIS — I82409 Acute embolism and thrombosis of unspecified deep veins of unspecified lower extremity: Secondary | ICD-10-CM

## 2019-03-25 DIAGNOSIS — Z7901 Long term (current) use of anticoagulants: Secondary | ICD-10-CM

## 2019-03-25 LAB — POCT INR: INR: 2.6 (ref 2.0–3.0)

## 2019-03-25 NOTE — Telephone Encounter (Signed)
Ocie Cornfield, Nickola Major, RN; [place], Cathe Mons, [place]; [place], Ramona C, [place]   Got it.

## 2019-03-25 NOTE — Progress Notes (Signed)
Anticoagulation Management Caroline [name] is a 74 y.o. female who reports to the clinic for monitoring of warfarin treatment.    Indication: h/o DVT and PE  Duration: indefinite Supervising physician: [place]  Anticoagulation Clinic Visit History: Patient does not report signs/symptoms of bleeding or thromboembolism  Other recent changes: No changes in diet, medications, lifestyle Anticoagulation Episode Summary    Current INR goal:  2.0-3.0  TTR:  80.7 % (4.1 y)  Next INR check:  04/15/2019  INR from last check:  2.6 (03/25/2019)  Weekly max warfarin dose:    Target end date:    INR check location:  Anticoagulation Clinic  Preferred lab:    Send INR reminders to:  [facility]   Indications   DVT lower extremity recurrent ([place]) [I82.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 Penicillins Anaphylaxis, Hives, Swelling and Rash    Has patient had a PCN reaction causing immediate rash, facial/tongue/throat swelling, SOB or lightheadedness with hypotension: Yes Has patient had a PCN reaction causing severe rash involving mucus membranes or skin necrosis: Yes Has patient had a PCN reaction that required hospitalization Yes Has patient had a PCN reaction occurring within the last 10 years: No If all of the above answers are "NO", then may proceed with Cephalosporin use.   . Cabbage Itching  . Keflex [Cephalexin] Hives    And feeling of throat tightness  . Shellfish Allergy Swelling  . Tomato Swelling  . Latex Itching and Rash    Current Outpatient Medications:  .  budesonide-formoterol (SYMBICORT) 160-4.5 MCG/ACT inhaler, Inhale 2 puffs into the lungs 2 (two) times daily., Disp: 3 Inhaler, Rfl: 4 .  calcium citrate-vitamin D (CITRACAL+D) 315-200 MG-UNIT tablet, TAKE 2 TABLETS DAILY BY MOUTH. (Patient taking differently: Take 2 tablets by mouth daily. ), Disp: 160 tablet, Rfl: 2 .  carvedilol (COREG)  12.5 MG tablet, Take 1 tablet (12.5 mg total) by mouth 2 (two) times daily with a meal. Please keep upcoming appt in February for future refills. Thank you, Disp: 60 tablet, Rfl: 0 .  cycloSPORINE (RESTASIS) 0.05 % ophthalmic emulsion, Place 1 drop into both eyes 2 (two) times daily., Disp: 1.5 mL, Rfl: 1 .  EPINEPHrine 0.3 mg/0.3 mL IJ SOAJ injection, Inject 0.3 mg into the muscle as needed. Severe allergic reactions, Disp: , Rfl: 0 .  fluticasone (FLONASE) 50 MCG/ACT nasal spray, PLACE 2 SPRAYS DAILY INTO BOTH NOSTRILS., Disp: 48 mL, Rfl: 1 .  hydrocortisone (PROCTOSOL HC) 2.5 % rectal cream, Place 1 application rectally 2 (two) times daily., Disp: 30 g, Rfl: 0 .  meclizine (ANTIVERT) 25 MG tablet, TAKE 1 TABLET BY MOUTH THREE TIMES A DAY AS NEEDED, Disp: 90 tablet, Rfl: 1 .  montelukast (SINGULAIR) 10 MG tablet, Take 1 tablet (10 mg total) by mouth at bedtime., Disp: 90 tablet, Rfl: 3 .  pantoprazole (PROTONIX) 40 MG tablet, TAKE 1 TABLET BY MOUTH EVERY DAY, Disp: 90 tablet, Rfl: 1 .  PROAIR HFA 108 (90 Base) MCG/ACT inhaler, TAKE 2 PUFFS BY MOUTH EVERY 6 HOURS AS NEEDED FOR WHEEZE OR SHORTNESS OF BREATH (Patient taking differently: Inhale 2 puffs into the lungs every 6 (six) hours as needed for wheezing or shortness of breath. ), Disp: 8.5 Inhaler, Rfl: 2 .  Psyllium (METAMUCIL PO), Take by mouth at bedtime. gummies, Disp: , Rfl:  .  rosuvastatin (CRESTOR) 20 MG tablet,  TAKE 1 TABLET BY MOUTH EVERY DAY, Disp: 90 tablet, Rfl: 3 .  warfarin (COUMADIN) 2 MG tablet, Take 1 tablet (2 mg total) by mouth daily at 6 PM., Disp: 30 tablet, Rfl: 0 Past Medical History:  Diagnosis Date  . Arthritis   . Asthma   . Bronchitis   . CHF (congestive heart failure) (HCC)   . Diabetes (HCC) 2019  . Diverticulitis   . DVT, lower extremity, recurrent (HCC)    Patient had unprovoked PE on 2002 and DVT in right lower extremety 2008.  [name] GERD (gastroesophageal reflux disease)   . History of kidney stones   .  Hypertension   . PE (pulmonary embolism)    Patient had unprovoked PE on 2002  . PONV (postoperative nausea and vomiting)   . Vertigo    Social History   Socioeconomic History  . Marital status: Divorced    Spouse name: Not on file  . Number of children: 5  . Years of education: 9 th grade  . Highest education level: 9th grade  Occupational History  . Occupation: retired  Tobacco Use  . Smoking status: Former Smoker    Types: Cigarettes    Quit date: 09/18/1988    Years since quitting: 30.5  . Smokeless tobacco: Never Used  . Tobacco comment: 6 pack year smoking history as a teen  Substance and Sexual Activity  . Alcohol use: No  . Drug use: No  . Sexual activity: Never    Birth control/protection: None  Other Topics Concern  . Not on file  Social History Narrative   Current Social History 05/17/2017        Patient lives with 31 yo granddaughter" in a/an home / condo / townhome which is 1 story/stories. There are not steps up to the entrance the patient uses.       Patient's method of transportation is church member.      The highest level of education was some high school.      The patient currently retired.      Identified important Relationships are God, family       Pets : 1 lab/pitt mix named Cinnamon       Interests / Fun: Church,TV       Current Stressors: "Me and my granddaughter"       Religious / Personal Beliefs: I'm Holiness and I love people"       Other: "I'd give away my last dime."    Social Determinants of Health   Financial Resource Strain:   . Difficulty of Paying Living Expenses:   Food Insecurity:   . Worried About Programme researcher, broadcasting/film/video in the Last Year:   . Barista in the Last Year:   Transportation Needs:   . Freight forwarder (Medical):   [name] Lack of Transportation (Non-Medical):   Physical Activity:   . Days of Exercise per Week:   . Minutes of Exercise per Session:   Stress:   . Feeling of Stress :   Social  Connections:   . Frequency of Communication with Friends and Family:   . Frequency of Social Gatherings with Friends and Family:   . Attends Religious Services:   . Active Member of Clubs or Organizations:   . Attends Banker Meetings:   [name] Marital Status:    Family History  Problem Relation Age of Onset  . Cancer Mother   . Hypertension Sister   . Diabetes Sister   .  Hypertension Brother   . Diabetes Brother   . Hypertension Sister   . Diabetes Sister   . Colon cancer Other   . Heart Problems Other 34       sister's child, open heart surgery  . CVA Father   . Diabetes Son   . Hypertension Son   . Kidney disease Son        on dialysis  . Hypertension Sister   . Diabetes Sister   . Hypertension Sister   . Diabetes Sister   . Cancer Brother   . Hypertension Son   . Diabetes Son   . Hypertension Daughter   . Schizophrenia Daughter     ASSESSMENT Recent Results: The most recent result is correlated with 22 mg per week: Lab Results  Component Value Date   INR 2.6 03/25/2019   INR 2.0 03/11/2019   INR 2.3 02/11/2019    Anticoagulation Dosing: Description   Hold doses from 03/28/2019 - 04/01/2019 in preparation for surgical procedure. Following surgery (1-2 days or when decided most appropriate by your physician), resume regimen of taking one (1) tablet on Sundays, Tuesdays, Thursdays and Saturdays; all other days, take only 1/2 tablet.     INR today: Therapeutic  PLAN The patient's doses will be held from 2/18 - 2/22 in preparation of colonoscopy. Patient has been advised to resume the 22 mg weekly regimen 1-2 days following procedure or when decided most appropriate by her physician.   Patient Instructions  Patient instructed to take medications as defined in the Anti-coagulation Track section of this encounter.  Patient instructed to take today's dose.  Patient instructed to hold doses from 03/28/2019 - 04/01/2019 in preparation for surgical procedure.  Following surgery (1-2 days or when decided most appropriate by your physician), resume regimen of taking one (1) tablet on Sundays, Tuesdays, Thursdays and Saturdays; all other days, take only 1/2 tablet. Patient verbalized understanding of these instructions.    Patient advised to contact clinic or seek medical attention if signs/symptoms of bleeding or thromboembolism occur.  Patient verbalized understanding by repeating back information and was advised to contact me if further medication-related questions arise. Patient was also provided an information handout.  Follow-up Return in 3 weeks (on 04/15/2019) for INR check at 10:15 am. .  Ellison Carwin, PharmD PGY1 Pharmacy Resident  15 minutes spent face-to-face with the patient during the encounter. 50% of time spent on education, including signs/sx bleeding and clotting, as well as food and drug interactions with warfarin. 50% of time was spent on fingerprick POC INR sample collection,processing, results determination, and documentation in [email or web address].

## 2019-03-25 NOTE — Patient Instructions (Signed)
Patient instructed to take medications as defined in the Anti-coagulation Track section of this encounter.  Patient instructed to take today's dose.  Patient instructed to hold doses from 03/28/2019 - 04/01/2019 in preparation for surgical procedure. Following surgery (1-2 days or when decided most appropriate by your physician), resume regimen of taking one (1) tablet on Sundays, Tuesdays, Thursdays and Saturdays; all other days, take only 1/2 tablet. Patient verbalized understanding of these instructions.

## 2019-03-25 NOTE — Progress Notes (Signed)
INTERNAL MEDICINE TEACHING ATTENDING ADDENDUM - Earl Lagos M.D  Duration- indefinite, Indication- recurrent VTE, INR- therapeutic. Agree with pharmacy recommendations as outlined in their note.   Patient's medication will be held for colonoscopy per Dr. Saralyn Pilar instructions

## 2019-03-27 ENCOUNTER — Telehealth: Payer: Self-pay

## 2019-03-27 ENCOUNTER — Telehealth: Payer: Self-pay | Admitting: Pharmacist

## 2019-03-27 NOTE — Telephone Encounter (Signed)
-----   Message from Napoleon Form, MD sent at 03/26/2019  1:30 PM EDT ----- Regarding: RE: [facility] pt Yes, I am fine with rescheduling the procedure in April.  Beth, can you please help with it ? Thanks VN ----- Message ----- From: Cathlyn Parsons, CRNA Sent: 03/26/2019  11:25 AM EDT To: Napoleon Form, MD Subject: [facility] pt                                         Dr. Lavon Paganini,  This pt is scheduled with you on 3/22 for a colonoscopy.  During her last appt with [facility] they scheduled a cardiac echo on 3/29.  Would it be possible to delay her colonoscopy until after the results of her echo can be evaluated?  Thanks,  Cathlyn Parsons

## 2019-03-27 NOTE — Telephone Encounter (Signed)
Called patient back after reading [facility] Note indicating postponment of colonoscopy for March until 19-APR-21. Patient will RTC on 12-APR-21 for INR and d/c instructions for 5 days prior to procedure on 19-APR-21.

## 2019-03-27 NOTE — Telephone Encounter (Signed)
Pre-operative Risk Assessment   Patient has been rescheduled from 04/01/19 -     Request for surgical clearance:     Endoscopy Procedure  What type of surgery is being performed?      Colonoscopy  When is this surgery scheduled?     04/29/19  Moved from 04/01/19  What type of clearance is required ?   Pharmacy  Are there any medications that need to be held prior to surgery and how long? Warfarin to be held for 5 days prior to procedure  Practice name and name of physician performing surgery?      [facility]  What is your office phone and fax number?      Phone- [phone]  [phone]  Anesthesia type (None, local, MAC, general) ?       MAC

## 2019-03-27 NOTE — Telephone Encounter (Signed)
Patient called me stating her colonoscopy has been postponed until 29-MAR-21. I advised to resume warfarin based upon last full weekly instructions provided. RTC 22-MAR-21 for INR and discontinuation instructions for procedure on 29-MAR-21.

## 2019-03-28 NOTE — Telephone Encounter (Signed)
Thank you. I agree 

## 2019-03-29 ENCOUNTER — Other Ambulatory Visit: Payer: Self-pay | Admitting: Cardiology

## 2019-03-29 DIAGNOSIS — I10 Essential (primary) hypertension: Secondary | ICD-10-CM

## 2019-04-01 ENCOUNTER — Encounter: Payer: Self-pay | Admitting: Gastroenterology

## 2019-04-04 DIAGNOSIS — I5042 Chronic combined systolic (congestive) and diastolic (congestive) heart failure: Secondary | ICD-10-CM | POA: Diagnosis not present

## 2019-04-04 DIAGNOSIS — E119 Type 2 diabetes mellitus without complications: Secondary | ICD-10-CM | POA: Diagnosis not present

## 2019-04-08 ENCOUNTER — Other Ambulatory Visit: Payer: Self-pay

## 2019-04-08 ENCOUNTER — Ambulatory Visit (HOSPITAL_COMMUNITY): Payer: Medicare Other | Attending: Cardiology

## 2019-04-08 DIAGNOSIS — I429 Cardiomyopathy, unspecified: Secondary | ICD-10-CM | POA: Diagnosis not present

## 2019-04-08 DIAGNOSIS — R002 Palpitations: Secondary | ICD-10-CM

## 2019-04-08 DIAGNOSIS — I5042 Chronic combined systolic (congestive) and diastolic (congestive) heart failure: Secondary | ICD-10-CM

## 2019-04-08 DIAGNOSIS — Z79899 Other long term (current) drug therapy: Secondary | ICD-10-CM

## 2019-04-10 ENCOUNTER — Telehealth: Payer: Self-pay | Admitting: *Deleted

## 2019-04-10 DIAGNOSIS — Z79899 Other long term (current) drug therapy: Secondary | ICD-10-CM

## 2019-04-10 MED ORDER — LOSARTAN POTASSIUM 25 MG PO TABS: 25.0000 mg | ORAL_TABLET | Freq: Every day | ORAL | 3 refills | Status: DC

## 2019-04-10 NOTE — Telephone Encounter (Signed)
Spoke with  patient about results with recommendations  and verbalized understanding. Patient will return for labs  04-17-19

## 2019-04-15 ENCOUNTER — Ambulatory Visit: Payer: Medicare Other

## 2019-04-17 ENCOUNTER — Other Ambulatory Visit: Payer: Self-pay

## 2019-04-17 ENCOUNTER — Other Ambulatory Visit: Payer: Medicare Other | Admitting: *Deleted

## 2019-04-17 DIAGNOSIS — Z79899 Other long term (current) drug therapy: Secondary | ICD-10-CM | POA: Diagnosis not present

## 2019-04-18 LAB — BASIC METABOLIC PANEL
BUN/Creatinine Ratio: 14 (ref 12–28)
BUN: 11 mg/dL (ref 8–27)
CO2: 29 mmol/L (ref 20–29)
Calcium: 9.4 mg/dL (ref 8.7–10.3)
Chloride: 104 mmol/L (ref 96–106)
Creatinine, Ser: 0.78 mg/dL (ref 0.57–1.00)
GFR calc Af Amer: 88 mL/min/{1.73_m2} (ref >59)
GFR calc non Af Amer: 76 mL/min/{1.73_m2} (ref >59)
Glucose: 95 mg/dL (ref 65–99)
Potassium: 3.8 mmol/L (ref 3.5–5.2)
Sodium: 145 mmol/L — ABNORMAL HIGH (ref 134–144)

## 2019-04-22 ENCOUNTER — Ambulatory Visit (INDEPENDENT_AMBULATORY_CARE_PROVIDER_SITE_OTHER): Payer: Medicare Other

## 2019-04-22 DIAGNOSIS — Z7901 Long term (current) use of anticoagulants: Secondary | ICD-10-CM | POA: Diagnosis not present

## 2019-04-22 DIAGNOSIS — Z86711 Personal history of pulmonary embolism: Secondary | ICD-10-CM | POA: Diagnosis not present

## 2019-04-22 DIAGNOSIS — Z5181 Encounter for therapeutic drug level monitoring: Secondary | ICD-10-CM

## 2019-04-22 DIAGNOSIS — Z86718 Personal history of other venous thrombosis and embolism: Secondary | ICD-10-CM

## 2019-04-22 DIAGNOSIS — I82409 Acute embolism and thrombosis of unspecified deep veins of unspecified lower extremity: Secondary | ICD-10-CM

## 2019-04-22 LAB — POCT INR: INR: 2.8 (ref 2.0–3.0)

## 2019-04-22 NOTE — Progress Notes (Signed)
INTERNAL MEDICINE TEACHING ATTENDING ADDENDUM  I agree with pharmacy recommendations as outlined in their note.   Jatziri Goffredo N Yarrow Linhart, MD  

## 2019-04-22 NOTE — Patient Instructions (Signed)
Patient instructed to take medications as defined in the Anti-coagulation Track section of this encounter.  Patient instructed to take today's dose.  Patient instructed to take one (1) 4 mg blue tablet on Sundays, Tuesdays, Thursdays and Saturdays; all other days, take only 1/2 tablet. Patient verbalized understanding of these instructions.

## 2019-04-22 NOTE — Progress Notes (Signed)
Anticoagulation Management Caroline [name] is a 74 y.o. female who reports to the clinic for monitoring of warfarin treatment.    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; history of chronic anticoagulation   Duration: indefinite Supervising physician: [name]   Anticoagulation Clinic Visit History: Patient does not report signs/symptoms of bleeding or thromboembolism  Other recent changes: endorses no diet, medications, lifestyle changes  Anticoagulation Episode Summary    Current INR goal:  2.0-3.0  TTR:  81.1 % (4.2 y)  Next INR check:  05/13/2019  INR from last check:  2.8 (04/22/2019)  Weekly max warfarin dose:    Target end date:    INR check location:  Anticoagulation Clinic  Preferred lab:    Send INR reminders to:  [facility]   Indications   DVT lower extremity recurrent ([place]) [I82.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 Penicillins Anaphylaxis, Hives, Swelling and Rash    Has patient had a PCN reaction causing immediate rash, facial/tongue/throat swelling, SOB or lightheadedness with hypotension: Yes Has patient had a PCN reaction causing severe rash involving mucus membranes or skin necrosis: Yes Has patient had a PCN reaction that required hospitalization Yes Has patient had a PCN reaction occurring within the last 10 years: No If all of the above answers are "NO", then may proceed with Cephalosporin use.   . Cabbage Itching  . Keflex [Cephalexin] Hives    And feeling of throat tightness  . Shellfish Allergy Swelling  . Tomato Swelling  . Latex Itching and Rash    Current Outpatient Medications:  .  budesonide-formoterol (SYMBICORT) 160-4.5 MCG/ACT inhaler, Inhale 2 puffs into the lungs 2 (two) times daily., Disp: 3 Inhaler, Rfl: 4 .  calcium citrate-vitamin D (CITRACAL+D) 315-200 MG-UNIT tablet, TAKE 2 TABLETS DAILY BY MOUTH. (Patient taking differently: Take 2 tablets  by mouth daily. ), Disp: 160 tablet, Rfl: 2 .  carvedilol (COREG) 12.5 MG tablet, TAKE 1 TABLET BY MOUTH TWICE A DAY, Disp: 180 tablet, Rfl: 3 .  cycloSPORINE (RESTASIS) 0.05 % ophthalmic emulsion, Place 1 drop into both eyes 2 (two) times daily., Disp: 1.5 mL, Rfl: 1 .  EPINEPHrine 0.3 mg/0.3 mL IJ SOAJ injection, Inject 0.3 mg into the muscle as needed. Severe allergic reactions, Disp: , Rfl: 0 .  fluticasone (FLONASE) 50 MCG/ACT nasal spray, PLACE 2 SPRAYS DAILY INTO BOTH NOSTRILS., Disp: 48 mL, Rfl: 1 .  hydrocortisone (PROCTOSOL HC) 2.5 % rectal cream, Place 1 application rectally 2 (two) times daily., Disp: 30 g, Rfl: 0 .  losartan (COZAAR) 25 MG tablet, Take 1 tablet (25 mg total) by mouth daily., Disp: 90 tablet, Rfl: 3 .  meclizine (ANTIVERT) 25 MG tablet, TAKE 1 TABLET BY MOUTH THREE TIMES A DAY AS NEEDED, Disp: 90 tablet, Rfl: 1 .  montelukast (SINGULAIR) 10 MG tablet, Take 1 tablet (10 mg total) by mouth at bedtime., Disp: 90 tablet, Rfl: 3 .  pantoprazole (PROTONIX) 40 MG tablet, TAKE 1 TABLET BY MOUTH EVERY DAY, Disp: 90 tablet, Rfl: 1 .  PROAIR HFA 108 (90 Base) MCG/ACT inhaler, TAKE 2 PUFFS BY MOUTH EVERY 6 HOURS AS NEEDED FOR WHEEZE OR SHORTNESS OF BREATH (Patient taking differently: Inhale 2 puffs into the lungs every 6 (six) hours as needed for wheezing or shortness of breath. ), Disp: 8.5 Inhaler, Rfl: 2 .  Psyllium (METAMUCIL PO), Take by mouth at  bedtime. gummies, Disp: , Rfl:  .  rosuvastatin (CRESTOR) 20 MG tablet, TAKE 1 TABLET BY MOUTH EVERY DAY, Disp: 90 tablet, Rfl: 3 .  warfarin (COUMADIN) 2 MG tablet, Take 1 tablet (2 mg total) by mouth daily at 6 PM., Disp: 30 tablet, Rfl: 0 Past Medical History:  Diagnosis Date  . Arthritis   . Asthma   . Bronchitis   . CHF (congestive heart failure) (HCC)   . Diabetes (HCC) 2019  . Diverticulitis   . DVT, lower extremity, recurrent (HCC)    Patient had unprovoked PE on 2002 and DVT in right lower extremety 2008.  [name] GERD  (gastroesophageal reflux disease)   . History of kidney stones   . Hypertension   . PE (pulmonary embolism)    Patient had unprovoked PE on 2002  . PONV (postoperative nausea and vomiting)   . Vertigo    Social History   Socioeconomic History  . Marital status: Divorced    Spouse name: Not on file  . Number of children: 5  . Years of education: 9 th grade  . Highest education level: 9th grade  Occupational History  . Occupation: retired  Tobacco Use  . Smoking status: Former Smoker    Types: Cigarettes    Quit date: 09/18/1988    Years since quitting: 30.6  . Smokeless tobacco: Never Used  . Tobacco comment: 6 pack year smoking history as a teen  Substance and Sexual Activity  . Alcohol use: No  . Drug use: No  . Sexual activity: Never    Birth control/protection: None  Other Topics Concern  . Not on file  Social History Narrative   Current Social History 05/17/2017        Patient lives with 38 yo granddaughter" in a/an home / condo / townhome which is 1 story/stories. There are not steps up to the entrance the patient uses.       Patient's method of transportation is church member.      The highest level of education was some high school.      The patient currently retired.      Identified important Relationships are God, family       Pets : 1 lab/pitt mix named Cinnamon       Interests / Fun: Church,TV       Current Stressors: "Me and my granddaughter"       Religious / Personal Beliefs: I'm Holiness and I love people"       Other: "I'd give away my last dime."    Social Determinants of Health   Financial Resource Strain:   . Difficulty of Paying Living Expenses:   Food Insecurity:   . Worried About Programme researcher, broadcasting/film/video in the Last Year:   . Barista in the Last Year:   Transportation Needs:   . Freight forwarder (Medical):   [name] Lack of Transportation (Non-Medical):   Physical Activity:   . Days of Exercise per Week:   . Minutes of Exercise  per Session:   Stress:   . Feeling of Stress :   Social Connections:   . Frequency of Communication with Friends and Family:   . Frequency of Social Gatherings with Friends and Family:   . Attends Religious Services:   . Active Member of Clubs or Organizations:   . Attends Banker Meetings:   [name] Marital Status:    Family History  Problem Relation Age of Onset  .  Cancer Mother   . Hypertension Sister   . Diabetes Sister   . Hypertension Brother   . Diabetes Brother   . Hypertension Sister   . Diabetes Sister   . Colon cancer Other   . Heart Problems Other 34       sister's child, open heart surgery  . CVA Father   . Diabetes Son   . Hypertension Son   . Kidney disease Son        on dialysis  . Hypertension Sister   . Diabetes Sister   . Hypertension Sister   . Diabetes Sister   . Cancer Brother   . Hypertension Son   . Diabetes Son   . Hypertension Daughter   . Schizophrenia Daughter     ASSESSMENT Recent Results: The most recent result is correlated with 22 mg per week: Lab Results  Component Value Date   INR 2.8 04/22/2019   INR 2.6 03/25/2019   INR 2.0 03/11/2019    Anticoagulation Dosing: Description   Take one (1) 4 mg blue tablet on Sundays, Tuesdays, Thursdays and Saturdays; all other days, take only 1/2 tablet.     INR today: Therapeutic  PLAN Weekly dose was unchanged  Patient Instructions  Patient instructed to take medications as defined in the Anti-coagulation Track section of this encounter.  Patient instructed to take today's dose.  Patient instructed to take one (1) 4 mg blue tablet on Sundays, Tuesdays, Thursdays and Saturdays; all other days, take only 1/2 tablet. Patient verbalized understanding of these instructions.    Patient advised to contact clinic or seek medical attention if signs/symptoms of bleeding or thromboembolism occur.  Patient verbalized understanding by repeating back information and was advised to  contact me if further medication-related questions arise. Patient was also provided an information handout.  Follow-up Return in about 3 weeks (around 05/13/2019) for Follow-up INR.  Patient verbalized understanding that if she experiences any concern for bleeding or clot to call the clinic  Charlett Nose, PharmD  PGY1 Acute Care Pharmacy Resident  15 minutes spent face-to-face with the patient during the encounter. 50% of time spent on education, including signs/sx bleeding and clotting, as well as food and drug interactions with warfarin. 50% of time was spent on fingerprick POC INR sample collection,processing, results determination, and documentation in [email or web address]

## 2019-04-29 ENCOUNTER — Encounter: Payer: Self-pay | Admitting: Gastroenterology

## 2019-05-07 ENCOUNTER — Other Ambulatory Visit: Payer: Self-pay | Admitting: Internal Medicine

## 2019-05-07 DIAGNOSIS — R42 Dizziness and giddiness: Secondary | ICD-10-CM

## 2019-05-09 ENCOUNTER — Other Ambulatory Visit: Payer: Self-pay | Admitting: Internal Medicine

## 2019-05-09 DIAGNOSIS — Z6841 Body Mass Index (BMI) 40.0 and over, adult: Secondary | ICD-10-CM

## 2019-05-13 ENCOUNTER — Ambulatory Visit (INDEPENDENT_AMBULATORY_CARE_PROVIDER_SITE_OTHER): Payer: Medicare Other | Admitting: Pharmacist

## 2019-05-13 DIAGNOSIS — I82409 Acute embolism and thrombosis of unspecified deep veins of unspecified lower extremity: Secondary | ICD-10-CM | POA: Diagnosis not present

## 2019-05-13 DIAGNOSIS — Z7901 Long term (current) use of anticoagulants: Secondary | ICD-10-CM | POA: Diagnosis not present

## 2019-05-13 LAB — POCT INR: INR: 2.9 (ref 2.0–3.0)

## 2019-05-13 NOTE — Progress Notes (Signed)
Anticoagulation Management Caroline [name] is a 74 y.o. female who reports to the clinic for monitoring of warfarin treatment.    Indication: PE, History of (resolved); DVT, History of (resolved); Long term current use of anticoagulant.   Duration: indefinite Supervising physician: Blanch Media  Anticoagulation Clinic Visit History: Patient does not report signs/symptoms of bleeding or thromboembolism  Other recent changes: No diet, medications, lifestyle changes endorsed by the patient.  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 Penicillins Anaphylaxis, Hives, Swelling and Rash    Has patient had a PCN reaction causing immediate rash, facial/tongue/throat swelling, SOB or lightheadedness with hypotension: Yes Has patient had a PCN reaction causing severe rash involving mucus membranes or skin necrosis: Yes Has patient had a PCN reaction that required hospitalization Yes Has patient had a PCN reaction occurring within the last 10 years: No If all of the above answers are "NO", then may proceed with Cephalosporin use.   . Cabbage Itching  . Keflex [Cephalexin] Hives    And feeling of throat tightness  . Shellfish Allergy Swelling  . Tomato Swelling  . Latex Itching and Rash    Current Outpatient Medications:  .  budesonide-formoterol (SYMBICORT) 160-4.5 MCG/ACT inhaler, Inhale 2 puffs into the lungs 2 (two) times daily., Disp: 3 Inhaler, Rfl: 4 .  calcium citrate-vitamin D (CITRACAL+D) 315-200 MG-UNIT tablet, TAKE 2 TABLETS DAILY BY MOUTH.  (Patient taking differently: Take 2 tablets by mouth daily. ), Disp: 160 tablet, Rfl: 2 .  carvedilol (COREG) 12.5 MG tablet, TAKE 1 TABLET BY MOUTH TWICE A DAY, Disp: 180 tablet, Rfl: 3 .  cycloSPORINE (RESTASIS) 0.05 % ophthalmic emulsion, Place 1 drop into both eyes 2 (two) times daily., Disp: 1.5 mL, Rfl: 1 .  EPINEPHrine 0.3 mg/0.3 mL IJ SOAJ injection, Inject 0.3 mg into the muscle as needed. Severe allergic reactions, Disp: , Rfl: 0 .  fluticasone (FLONASE) 50 MCG/ACT nasal spray, PLACE 2 SPRAYS DAILY INTO BOTH NOSTRILS., Disp: 48 mL, Rfl: 1 .  hydrocortisone (PROCTOSOL HC) 2.5 % rectal cream, Place 1 application rectally 2 (two) times daily., Disp: 30 g, Rfl: 0 .  losartan (COZAAR) 25 MG tablet, Take 1 tablet (25 mg total) by mouth daily., Disp: 90 tablet, Rfl: 3 .  meclizine (ANTIVERT) 25 MG tablet, TAKE 1 TABLET BY MOUTH THREE TIMES A DAY AS NEEDED, Disp: 90 tablet, Rfl: 1 .  montelukast (SINGULAIR) 10 MG tablet, Take 1 tablet (10 mg total) by mouth at bedtime., Disp: 90 tablet, Rfl: 3 .  pantoprazole (PROTONIX) 40 MG tablet, TAKE 1 TABLET BY MOUTH EVERY DAY, Disp: 90 tablet, Rfl: 1 .  PROAIR HFA 108 (90 Base) MCG/ACT inhaler, TAKE 2 PUFFS BY MOUTH EVERY 6 HOURS AS NEEDED FOR WHEEZE OR SHORTNESS OF BREATH (Patient taking differently: Inhale 2 puffs into the lungs every 6 (six) hours as needed for wheezing or shortness of breath. ), Disp: 8.5 Inhaler, Rfl: 2 .  Psyllium (METAMUCIL  PO), Take by mouth at bedtime. gummies, Disp: , Rfl:  .  rosuvastatin (CRESTOR) 20 MG tablet, TAKE 1 TABLET BY MOUTH EVERY DAY, Disp: 90 tablet, Rfl: 3 .  warfarin (COUMADIN) 2 MG tablet, Take 1 tablet (2 mg total) by mouth daily at 6 PM., Disp: 30 tablet, Rfl: 0 Past Medical History:  Diagnosis Date  . Arthritis   . Asthma   . Bronchitis   . CHF (congestive heart failure) (HCC)   . Diabetes (HCC) 2019  . Diverticulitis   . DVT, lower extremity, recurrent (HCC)    Patient had unprovoked PE on 2002 and DVT in  right lower extremety 2008.  [name] GERD (gastroesophageal reflux disease)   . History of kidney stones   . Hypertension   . PE (pulmonary embolism)    Patient had unprovoked PE on 2002  . PONV (postoperative nausea and vomiting)   . Vertigo    Social History   Socioeconomic History  . Marital status: Divorced    Spouse name: Not on file  . Number of children: 5  . Years of education: 9 th grade  . Highest education level: 9th grade  Occupational History  . Occupation: retired  Tobacco Use  . Smoking status: Former Smoker    Types: Cigarettes    Quit date: 09/18/1988    Years since quitting: 30.6  . Smokeless tobacco: Never Used  . Tobacco comment: 6 pack year smoking history as a teen  Substance and Sexual Activity  . Alcohol use: No  . Drug use: No  . Sexual activity: Never    Birth control/protection: None  Other Topics Concern  . Not on file  Social History Narrative   Current Social History 05/17/2017        Patient lives with 64 yo granddaughter" in a/an home / condo / townhome which is 1 story/stories. There are not steps up to the entrance the patient uses.       Patient's method of transportation is church member.      The highest level of education was some high school.      The patient currently retired.      Identified important Relationships are God, family       Pets : 1 lab/pitt mix named Cinnamon       Interests / Fun: Church,TV       Current Stressors: "Me and my granddaughter"       Religious / Personal Beliefs: I'm Holiness and I love people"       Other: "I'd give away my last dime."    Social Determinants of Health   Financial Resource Strain:   . Difficulty of Paying Living Expenses:   Food Insecurity:   . Worried About Programme researcher, broadcasting/film/video in the Last Year:   . Barista in the Last Year:   Transportation Needs:   . Freight forwarder (Medical):   [name] Lack of Transportation (Non-Medical):   Physical Activity:   . Days of Exercise  per Week:   . Minutes of Exercise per Session:   Stress:   . Feeling of Stress :   Social Connections:   . Frequency of Communication with Friends and Family:   . Frequency of Social Gatherings with Friends and Family:   . Attends Religious Services:   . Active Member of Clubs or Organizations:   . Attends Banker Meetings:   [name] Marital Status:    Family History  Problem Relation  Age of Onset  . Cancer Mother   . Hypertension Sister   . Diabetes Sister   . Hypertension Brother   . Diabetes Brother   . Hypertension Sister   . Diabetes Sister   . Colon cancer Other   . Heart Problems Other 34       sister's child, open heart surgery  . CVA Father   . Diabetes Son   . Hypertension Son   . Kidney disease Son        on dialysis  . Hypertension Sister   . Diabetes Sister   . Hypertension Sister   . Diabetes Sister   . Cancer Brother   . Hypertension Son   . Diabetes Son   . Hypertension Daughter   . Schizophrenia Daughter     ASSESSMENT Recent Results: The most recent result is correlated with 22 mg per week: Lab Results  Component Value Date   INR 2.9 05/13/2019   INR 2.8 04/22/2019   INR 2.6 03/25/2019    Anticoagulation Dosing: Description   Take one (1) 4 mg blue tablet on Sundays, Tuesdays, and Thursdays.All other days, take only 1/2 tablet.     INR today: Therapeutic  PLAN Weekly dose was decreased by 9% to 20 mg per week  Patient Instructions  Patient instructed to take medications as defined in the Anti-coagulation Track section of this encounter.  Patient instructed to take today's dose.  Patient instructed to take one (1) 4 mg blue tablet on Sundays, Tuesdays, and Thursdays.All other days, take only 1/2 tablet. Patient verbalized understanding of these instructions.    Patient advised to contact clinic or seek medical attention if signs/symptoms of bleeding or thromboembolism occur.  Patient verbalized understanding by repeating  back information and was advised to contact me if further medication-related questions arise. Patient was also provided an information handout.  Follow-up Return in 3 weeks (on 06/03/2019) for Follow up INR.  Pennie Banter, PharmD, CPP  15 minutes spent face-to-face with the patient during the encounter. 50% of time spent on education, including signs/sx bleeding and clotting, as well as food and drug interactions with warfarin. 50% of time was spent on fingerprick POC INR sample collection,processing, results determination, and documentation in [email or web address]

## 2019-05-13 NOTE — Patient Instructions (Signed)
Patient instructed to take medications as defined in the Anti-coagulation Track section of this encounter.  °Patient instructed to take today's dose.  °Patient instructed to take one (1) 4 mg blue tablet on Sundays, Tuesdays, and Thursdays.All other days, take only 1/2 tablet. °Patient verbalized understanding of these instructions.  ° °

## 2019-05-22 ENCOUNTER — Encounter: Payer: Self-pay | Admitting: *Deleted

## 2019-05-22 NOTE — Progress Notes (Unsigned)
Things That May Be Affecting Your Health:  Alcohol  Hearing loss  Pain   x Depression  Home Safety  Sexual Health  x Diabetes  Lack of physical activity  Stress   Difficulty with daily activities  Loneliness  Tiredness   Drug use  Medicines  Tobacco use   Falls  Motor Vehicle Safety x Weight   Food choices  Oral Health  Other    YOUR PERSONALIZED HEALTH PLAN : 1. Schedule your next subsequent Medicare Wellness visit in one year 2. Attend all of your regular appointments to address your medical issues 3. Complete the preventative screenings and services   Annual Wellness Visit   Medicare Covered Preventative Screenings and Services  Services & Screenings Men and Women Who How Often Need? Date of Last Service Action  Abdominal Aortic Aneurysm Adults with AAA risk factors Once     Alcohol Misuse and Counseling All Adults Screening once a year if no alcohol misuse. Counseling up to 4 face to face sessions.     Bone Density Measurement  Adults at risk for osteoporosis Once every 2 yrs     Lipid Panel Z13.6 All adults without CV disease Once every 5 yrs     Colorectal Cancer   Stool sample or  Colonoscopy All adults 50 and older   Once every year  Every 10 years x  FIT  Depression All Adults Once a year  Today   Diabetes Screening Blood glucose, post glucose load, or GTT Z13.1  All adults at risk  Pre-diabetics  Once per year  Twice per year     Diabetes  Self-Management Training All adults Diabetics 10 hrs first year; 2 hours subsequent years. Requires Copay     Glaucoma  Diabetics  Family history of glaucoma  African Americans 50 yrs +  Hispanic Americans 65 yrs + Annually - requires coppay     Hepatitis C Z72.89 or F19.20  High Risk for HCV  Born between 1945 and 1965  Annually  Once     HIV Z11.4 All adults based on risk  Annually btw ages 60 & 70 regardless of risk  Annually > 65 yrs if at increased risk     Lung Cancer Screening Asymptomatic adults  aged 77-77 with 30 pack yr history and current smoker OR quit within the last 15 yrs Annually Must have counseling and shared decision making documentation before first screen     Medical Nutrition Therapy Adults with   Diabetes  Renal disease  Kidney transplant within past 3 yrs 3 hours first year; 2 hours subsequent years     Obesity and Counseling All adults Screening once a year Counseling if BMI 30 or higher  Today x  Tobacco Use Counseling Adults who use tobacco  Up to 8 visits in one year     Vaccines Z23  Hepatitis B  Influenza   Pneumonia  Adults   Once  Once every flu season  Two different vaccines separated by one year     Next Annual Wellness Visit People with Medicare Every year  Today     Services & Screenings Women Who How Often Need  Date of Last Service Action  Mammogram  Z12.31 Women over 40 One baseline ages 81-39. Annually ager 40 yrs+     Pap tests All women Annually if high risk. Every 2 yrs for normal risk women     Screening for cervical cancer with   Pap (Z01.419 nl or Z01.411abnl) &  HPV Z11.51 Women aged 11 to 88 Once every 5 yrs     Screening pelvic and breast exams All women Annually if high risk. Every 2 yrs for normal risk women     Sexually Transmitted Diseases  Chlamydia  Gonorrhea  Syphilis All at risk adults Annually for non pregnant females at increased risk         [place] Men Who How Ofter Need  Date of Last Service Action  Prostate Cancer - DRE & PSA Men over 50 Annually.  DRE might require a copay.     Sexually Transmitted Diseases  Syphilis All at risk adults Annually for men at increased risk

## 2019-05-22 NOTE — Progress Notes (Unsigned)

## 2019-05-28 ENCOUNTER — Other Ambulatory Visit: Payer: Self-pay | Admitting: *Deleted

## 2019-05-29 MED ORDER — WARFARIN SODIUM 4 MG PO TABS: ORAL_TABLET | ORAL | 3 refills | Status: DC

## 2019-06-03 ENCOUNTER — Ambulatory Visit (INDEPENDENT_AMBULATORY_CARE_PROVIDER_SITE_OTHER): Payer: Medicare Other | Admitting: Pharmacist

## 2019-06-03 DIAGNOSIS — Z7901 Long term (current) use of anticoagulants: Secondary | ICD-10-CM

## 2019-06-03 DIAGNOSIS — I82409 Acute embolism and thrombosis of unspecified deep veins of unspecified lower extremity: Secondary | ICD-10-CM

## 2019-06-03 LAB — POCT INR: INR: 2.4 (ref 2.0–3.0)

## 2019-06-03 NOTE — Progress Notes (Signed)
INTERNAL MEDICINE TEACHING ATTENDING ADDENDUM - Lamond Glantz M.D  Duration- indefinite, Indication- PE, DVT, INR- therapeutic. Agree with pharmacy recommendations as outlined in their note.      

## 2019-06-03 NOTE — Progress Notes (Signed)
Anticoagulation Management Caroline [name] is a 74 y.o. female who reports to the clinic for monitoring of warfarin treatment.    Indication: DVT, history of LLE DVT (resolved); Long term current use of anticoagulant.   Duration: indefinite Supervising physician: Earl Lagos  Anticoagulation Clinic Visit History: Patient does not report signs/symptoms of bleeding or thromboembolism  Other recent changes: No diet, medications, lifestyle changes endorsed.  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 Penicillins Anaphylaxis, Hives, Swelling and Rash    Has patient had a PCN reaction causing immediate rash, facial/tongue/throat swelling, SOB or lightheadedness with hypotension: Yes Has patient had a PCN reaction causing severe rash involving mucus membranes or skin necrosis: Yes Has patient had a PCN reaction that required hospitalization Yes Has patient had a PCN reaction occurring within the last 10 years: No If all of the above answers are "NO", then may proceed with Cephalosporin use.   . Cabbage Itching  . Keflex [Cephalexin] Hives    And feeling of throat tightness  . Shellfish Allergy Swelling  . Tomato Swelling  . Latex Itching and Rash    Current Outpatient Medications:  .  budesonide-formoterol (SYMBICORT) 160-4.5 MCG/ACT inhaler, Inhale 2 puffs into the lungs 2 (two) times daily., Disp: 3 Inhaler, Rfl: 4 .  calcium citrate-vitamin D (CITRACAL+D) 315-200 MG-UNIT tablet, Take 2 tablets by mouth daily., Disp: 180 tablet, Rfl: 2 .   carvedilol (COREG) 12.5 MG tablet, TAKE 1 TABLET BY MOUTH TWICE A DAY, Disp: 180 tablet, Rfl: 3 .  cycloSPORINE (RESTASIS) 0.05 % ophthalmic emulsion, Place 1 drop into both eyes 2 (two) times daily., Disp: 1.5 mL, Rfl: 1 .  EPINEPHrine 0.3 mg/0.3 mL IJ SOAJ injection, Inject 0.3 mg into the muscle as needed. Severe allergic reactions, Disp: , Rfl: 0 .  fluticasone (FLONASE) 50 MCG/ACT nasal spray, PLACE 2 SPRAYS DAILY INTO BOTH NOSTRILS., Disp: 48 mL, Rfl: 1 .  hydrocortisone (PROCTOSOL HC) 2.5 % rectal cream, Place 1 application rectally 2 (two) times daily., Disp: 30 g, Rfl: 0 .  losartan (COZAAR) 25 MG tablet, Take 1 tablet (25 mg total) by mouth daily., Disp: 90 tablet, Rfl: 3 .  meclizine (ANTIVERT) 25 MG tablet, TAKE 1 TABLET BY MOUTH THREE TIMES A DAY AS NEEDED, Disp: 90 tablet, Rfl: 1 .  montelukast (SINGULAIR) 10 MG tablet, Take 1 tablet (10 mg total) by mouth at bedtime., Disp: 90 tablet, Rfl: 3 .  pantoprazole (PROTONIX) 40 MG tablet, TAKE 1 TABLET BY MOUTH EVERY DAY, Disp: 90 tablet, Rfl: 1 .  PROAIR HFA 108 (90 Base) MCG/ACT inhaler, TAKE 2 PUFFS BY MOUTH EVERY 6 HOURS AS NEEDED FOR WHEEZE OR SHORTNESS OF BREATH (Patient taking differently: Inhale 2 puffs into the lungs every 6 (six) hours as needed for wheezing or shortness of breath. ), Disp: 8.5 Inhaler, Rfl: 2 .  Psyllium (METAMUCIL PO), Take by mouth at bedtime. gummies, Disp: , Rfl:  .  rosuvastatin (CRESTOR)  20 MG tablet, TAKE 1 TABLET BY MOUTH EVERY DAY, Disp: 90 tablet, Rfl: 3 .  warfarin (COUMADIN) 4 MG tablet, Take one (1) 4 mg blue tablet on Sundays, Tuesdays, and Thursdays. All other days, take only 1/2 tablet., Disp: 90 tablet, Rfl: 3 Past Medical History:  Diagnosis Date  . Arthritis   . Asthma   . Bronchitis   . CHF (congestive heart failure) ([place])   . Diabetes ([place]) 2019  . Diverticulitis   . DVT, lower extremity, recurrent ([place])    Patient had unprovoked PE on 2002 and DVT in right lower extremety 2008.  [name] GERD  (gastroesophageal reflux disease)   . History of kidney stones   . Hypertension   . PE (pulmonary embolism)    Patient had unprovoked PE on 2002  . PONV (postoperative nausea and vomiting)   . Vertigo    Social History   Socioeconomic History  . Marital status: Divorced    Spouse name: Not on file  . Number of children: 5  . Years of education: 9 th grade  . Highest education level: 9th grade  Occupational History  . Occupation: retired  Tobacco Use  . Smoking status: Former Smoker    Types: Cigarettes    Quit date: 09/18/1988    Years since quitting: 30.7  . Smokeless tobacco: Never Used  . Tobacco comment: 6 pack year smoking history as a teen  Substance and Sexual Activity  . Alcohol use: No  . Drug use: No  . Sexual activity: Never    Birth control/protection: None  Other Topics Concern  . Not on file  Social History Narrative   Current Social History 05/17/2017        Patient lives with 19 yo granddaughter" in a/an home / condo / townhome which is 1 story/stories. There are not steps up to the entrance the patient uses.       Patient's method of transportation is church member.      The highest level of education was some high school.      The patient currently retired.      Identified important Relationships are God, family       Pets : 1 lab/pitt mix named Cinnamon       Interests / Fun: Church,TV       Current Stressors: "Me and my granddaughter"       Religious / Personal Beliefs: I'm Holiness and I love people"       Other: "I'd give away my last dime."    Social Determinants of Health   Financial Resource Strain:   . Difficulty of Paying Living Expenses:   Food Insecurity:   . Worried About Charity fundraiser in the Last Year:   . Arboriculturist in the Last Year:   Transportation Needs:   . Film/video editor (Medical):   [name] Lack of Transportation (Non-Medical):   Physical Activity:   . Days of Exercise per Week:   . Minutes of Exercise  per Session:   Stress:   . Feeling of Stress :   Social Connections:   . Frequency of Communication with Friends and Family:   . Frequency of Social Gatherings with Friends and Family:   . Attends Religious Services:   . Active Member of Clubs or Organizations:   . Attends Archivist Meetings:   [name] Marital Status:    Family History  Problem Relation Age of Onset  . Cancer Mother   .  Hypertension Sister   . Diabetes Sister   . Hypertension Brother   . Diabetes Brother   . Hypertension Sister   . Diabetes Sister   . Colon cancer Other   . Heart Problems Other 34       sister's child, open heart surgery  . CVA Father   . Diabetes Son   . Hypertension Son   . Kidney disease Son        on dialysis  . Hypertension Sister   . Diabetes Sister   . Hypertension Sister   . Diabetes Sister   . Cancer Brother   . Hypertension Son   . Diabetes Son   . Hypertension Daughter   . Schizophrenia Daughter     ASSESSMENT Recent Results: The most recent result is correlated with 20 mg per week: Lab Results  Component Value Date   INR 2.4 06/03/2019   INR 2.9 05/13/2019   INR 2.8 04/22/2019    Anticoagulation Dosing: Description   Take one (1) 4 mg blue tablet on Sundays, Tuesdays, and Thursdays.All other days, take only 1/2 tablet.     INR today: Therapeutic  PLAN Weekly dose was unchanged.   Patient Instructions  Patient instructed to take medications as defined in the Anti-coagulation Track section of this encounter.  Patient instructed to take today's dose.  Patient instructed to take one (1) 4 mg blue tablet on Sundays, Tuesdays, and Thursdays.All other days, take only 1/2 tablet. Patient verbalized understanding of these instructions.    Patient advised to contact clinic or seek medical attention if signs/symptoms of bleeding or thromboembolism occur.  Patient verbalized understanding by repeating back information and was advised to contact me if further  medication-related questions arise. Patient was also provided an information handout.  Follow-up Return in 3 weeks (on 06/24/2019) for Follow up INR.  Elicia Lamp, PharmD, CPP  15 minutes spent face-to-face with the patient during the encounter. 50% of time spent on education, including signs/sx bleeding and clotting, as well as food and drug interactions with warfarin. 50% of time was spent on fingerprick POC INR sample collection,processing, results determination, and documentation in [email or web address].

## 2019-06-03 NOTE — Patient Instructions (Signed)
Patient instructed to take medications as defined in the Anti-coagulation Track section of this encounter.  °Patient instructed to take today's dose.  °Patient instructed to take one (1) 4 mg blue tablet on Sundays, Tuesdays, and Thursdays.All other days, take only 1/2 tablet. °Patient verbalized understanding of these instructions.  ° °

## 2019-06-05 ENCOUNTER — Other Ambulatory Visit: Payer: Self-pay | Admitting: Internal Medicine

## 2019-06-05 DIAGNOSIS — K219 Gastro-esophageal reflux disease without esophagitis: Secondary | ICD-10-CM

## 2019-06-24 ENCOUNTER — Ambulatory Visit (INDEPENDENT_AMBULATORY_CARE_PROVIDER_SITE_OTHER): Payer: Medicare Other | Admitting: Pharmacist

## 2019-06-24 DIAGNOSIS — Z7901 Long term (current) use of anticoagulants: Secondary | ICD-10-CM | POA: Diagnosis not present

## 2019-06-24 DIAGNOSIS — I82402 Acute embolism and thrombosis of unspecified deep veins of left lower extremity: Secondary | ICD-10-CM

## 2019-06-24 LAB — POCT INR: INR: 2.4 (ref 2.0–3.0)

## 2019-06-24 NOTE — Progress Notes (Signed)
Anticoagulation Management Caroline [name] is a 74 y.o. female who reports to the clinic for monitoring of warfarin treatment.    Indication: DVT , History of (resolved); Long term current use of anticoagulant.   Duration: indefinite Supervising physician: Carlynn Purl  Anticoagulation Clinic Visit History: Patient does not report signs/symptoms of bleeding or thromboembolism  Other recent changes: No diet, medications, lifestyle changes.  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 Penicillins Anaphylaxis, Hives, Swelling and Rash    Has patient had a PCN reaction causing immediate rash, facial/tongue/throat swelling, SOB or lightheadedness with hypotension: Yes Has patient had a PCN reaction causing severe rash involving mucus membranes or skin necrosis: Yes Has patient had a PCN reaction that required hospitalization Yes Has patient had a PCN reaction occurring within the last 10 years: No If all of the above answers are "NO", then may proceed with Cephalosporin use.   . Cabbage Itching  . Keflex [Cephalexin] Hives    And feeling of throat tightness  . Shellfish Allergy Swelling  . Tomato Swelling  . Latex Itching and Rash    Current Outpatient Medications:  .  budesonide-formoterol (SYMBICORT) 160-4.5 MCG/ACT inhaler, Inhale 2 puffs into the lungs 2 (two) times daily., Disp: 3 Inhaler, Rfl: 4 .  calcium citrate-vitamin D (CITRACAL+D) 315-200 MG-UNIT tablet, Take 2 tablets by mouth daily., Disp: 180 tablet, Rfl: 2 .  carvedilol (COREG) 12.5 MG  tablet, TAKE 1 TABLET BY MOUTH TWICE A DAY, Disp: 180 tablet, Rfl: 3 .  cycloSPORINE (RESTASIS) 0.05 % ophthalmic emulsion, Place 1 drop into both eyes 2 (two) times daily., Disp: 1.5 mL, Rfl: 1 .  EPINEPHrine 0.3 mg/0.3 mL IJ SOAJ injection, Inject 0.3 mg into the muscle as needed. Severe allergic reactions, Disp: , Rfl: 0 .  fluticasone (FLONASE) 50 MCG/ACT nasal spray, PLACE 2 SPRAYS DAILY INTO BOTH NOSTRILS., Disp: 48 mL, Rfl: 1 .  hydrocortisone (PROCTOSOL HC) 2.5 % rectal cream, Place 1 application rectally 2 (two) times daily., Disp: 30 g, Rfl: 0 .  losartan (COZAAR) 25 MG tablet, Take 1 tablet (25 mg total) by mouth daily., Disp: 90 tablet, Rfl: 3 .  meclizine (ANTIVERT) 25 MG tablet, TAKE 1 TABLET BY MOUTH THREE TIMES A DAY AS NEEDED, Disp: 90 tablet, Rfl: 1 .  montelukast (SINGULAIR) 10 MG tablet, Take 1 tablet (10 mg total) by mouth at bedtime., Disp: 90 tablet, Rfl: 3 .  pantoprazole (PROTONIX) 40 MG tablet, TAKE 1 TABLET BY MOUTH EVERY DAY, Disp: 90 tablet, Rfl: 1 .  PROAIR HFA 108 (90 Base) MCG/ACT inhaler, TAKE 2 PUFFS BY MOUTH EVERY 6 HOURS AS NEEDED FOR WHEEZE OR SHORTNESS OF BREATH (Patient taking differently: Inhale 2 puffs into the lungs every 6 (six) hours as needed for wheezing or shortness of breath. ), Disp: 8.5 Inhaler, Rfl: 2 .  Psyllium (METAMUCIL PO), Take by mouth at bedtime. gummies, Disp: , Rfl:  .  rosuvastatin (CRESTOR) 20 MG  tablet, TAKE 1 TABLET BY MOUTH EVERY DAY, Disp: 90 tablet, Rfl: 3 .  warfarin (COUMADIN) 4 MG tablet, Take one (1) 4 mg blue tablet on Sundays, Tuesdays, and Thursdays. All other days, take only 1/2 tablet., Disp: 90 tablet, Rfl: 3 Past Medical History:  Diagnosis Date  . Arthritis   . Asthma   . Bronchitis   . CHF (congestive heart failure) (HCC)   . Diabetes (HCC) 2019  . Diverticulitis   . DVT, lower extremity, recurrent (HCC)    Patient had unprovoked PE on 2002 and DVT in right lower extremety 2008.  [name] GERD (gastroesophageal reflux  disease)   . History of kidney stones   . Hypertension   . PE (pulmonary embolism)    Patient had unprovoked PE on 2002  . PONV (postoperative nausea and vomiting)   . Vertigo    Social History   Socioeconomic History  . Marital status: Divorced    Spouse name: Not on file  . Number of children: 5  . Years of education: 9 th grade  . Highest education level: 9th grade  Occupational History  . Occupation: retired  Tobacco Use  . Smoking status: Former Smoker    Types: Cigarettes    Quit date: 09/18/1988    Years since quitting: 30.7  . Smokeless tobacco: Never Used  . Tobacco comment: 6 pack year smoking history as a teen  Vaping Use  . Vaping Use: Never used  Substance and Sexual Activity  . Alcohol use: No  . Drug use: No  . Sexual activity: Never    Birth control/protection: None  Other Topics Concern  . Not on file  Social History Narrative   Current Social History 05/17/2017        Patient lives with 107 yo granddaughter" in a/an home / condo / townhome which is 1 story/stories. There are not steps up to the entrance the patient uses.       Patient's method of transportation is church member.      The highest level of education was some high school.      The patient currently retired.      Identified important Relationships are God, family       Pets : 1 lab/pitt mix named Cinnamon       Interests / Fun: Church,TV       Current Stressors: "Me and my granddaughter"       Religious / Personal Beliefs: I'm Holiness and I love people"       Other: "I'd give away my last dime."    Social Determinants of Health   Financial Resource Strain:   . Difficulty of Paying Living Expenses:   Food Insecurity:   . Worried About Programme researcher, broadcasting/film/video in the Last Year:   . Barista in the Last Year:   Transportation Needs:   . Freight forwarder (Medical):   [name] Lack of Transportation (Non-Medical):   Physical Activity:   . Days of Exercise per Week:   .  Minutes of Exercise per Session:   Stress:   . Feeling of Stress :   Social Connections:   . Frequency of Communication with Friends and Family:   . Frequency of Social Gatherings with Friends and Family:   . Attends Religious Services:   . Active Member of Clubs or Organizations:   . Attends Banker Meetings:   [name] Marital Status:    Family History  Problem Relation Age  of Onset  . Cancer Mother   . Hypertension Sister   . Diabetes Sister   . Hypertension Brother   . Diabetes Brother   . Hypertension Sister   . Diabetes Sister   . Colon cancer Other   . Heart Problems Other 34       sister's child, open heart surgery  . CVA Father   . Diabetes Son   . Hypertension Son   . Kidney disease Son        on dialysis  . Hypertension Sister   . Diabetes Sister   . Hypertension Sister   . Diabetes Sister   . Cancer Brother   . Hypertension Son   . Diabetes Son   . Hypertension Daughter   . Schizophrenia Daughter     ASSESSMENT Recent Results: The most recent result is correlated with 20 mg per week: Lab Results  Component Value Date   INR 2.4 06/24/2019   INR 2.4 06/03/2019   INR 2.9 05/13/2019    Anticoagulation Dosing: Description   Take one (1) 4 mg blue tablet on Sundays, Tuesdays, and Thursdays.All other days, take only 1/2 tablet.     INR today: Therapeutic  PLAN Weekly dose was unchanged.   Patient Instructions  Patient instructed to take medications as defined in the Anti-coagulation Track section of this encounter.  Patient instructed to take today's dose.  Patient instructed to take one (1) 4 mg blue tablet on Sundays, Tuesdays, and Thursdays.All other days, take only 1/2 tablet. Patient verbalized understanding of these instructions.    Patient advised to contact clinic or seek medical attention if signs/symptoms of bleeding or thromboembolism occur.  Patient verbalized understanding by repeating back information and was advised to  contact me if further medication-related questions arise. Patient was also provided an information handout.  Follow-up Return in 4 weeks (on 07/22/2019) for Follow up INR.  Pennie Banter, PharmD, CPP  15 minutes spent face-to-face with the patient during the encounter. 50% of time spent on education, including signs/sx bleeding and clotting, as well as food and drug interactions with warfarin. 50% of time was spent on fingerprick POC INR sample collection,processing, results determination, and documentation in [email or web address]

## 2019-06-24 NOTE — Patient Instructions (Signed)
Patient instructed to take medications as defined in the Anti-coagulation Track section of this encounter.  °Patient instructed to take today's dose.  °Patient instructed to take one (1) 4 mg blue tablet on Sundays, Tuesdays, and Thursdays.All other days, take only 1/2 tablet. °Patient verbalized understanding of these instructions.  ° °

## 2019-06-25 ENCOUNTER — Telehealth: Payer: Self-pay | Admitting: *Deleted

## 2019-06-25 MED ORDER — HYDROCORTISONE (PERIANAL) 2.5 % EX CREA: 1.0000 "application " | TOPICAL_CREAM | Freq: Two times a day (BID) | CUTANEOUS | 0 refills | Status: DC

## 2019-06-25 NOTE — Telephone Encounter (Signed)
The following medication records are no longer available for ordering. Place a new order with a different medication record. hydrocortisone (PROCTOSOL HC) 2.5 % rectal cream

## 2019-06-25 NOTE — Telephone Encounter (Signed)
Ordered

## 2019-07-01 ENCOUNTER — Other Ambulatory Visit: Payer: Self-pay | Admitting: Internal Medicine

## 2019-07-01 DIAGNOSIS — R42 Dizziness and giddiness: Secondary | ICD-10-CM

## 2019-07-14 ENCOUNTER — Other Ambulatory Visit: Payer: Self-pay | Admitting: Internal Medicine

## 2019-07-14 DIAGNOSIS — R7309 Other abnormal glucose: Secondary | ICD-10-CM

## 2019-07-14 DIAGNOSIS — E118 Type 2 diabetes mellitus with unspecified complications: Secondary | ICD-10-CM

## 2019-07-14 DIAGNOSIS — J301 Allergic rhinitis due to pollen: Secondary | ICD-10-CM

## 2019-07-22 ENCOUNTER — Ambulatory Visit: Payer: Medicare Other | Admitting: Pharmacist

## 2019-07-22 DIAGNOSIS — Z7901 Long term (current) use of anticoagulants: Secondary | ICD-10-CM

## 2019-07-22 DIAGNOSIS — I82402 Acute embolism and thrombosis of unspecified deep veins of left lower extremity: Secondary | ICD-10-CM

## 2019-07-22 LAB — POCT INR: INR: 2.4 (ref 2.0–3.0)

## 2019-07-22 NOTE — Progress Notes (Signed)
Anticoagulation Management Caroline [name] is a 74 y.o. female who reports to the clinic for monitoring of warfarin treatment.    Indication: DVT, History of recurrent, left lower extremity; Long term current use of anticoagulant, warfarin.   Duration: indefinite Supervising physician: Charissa Bash, MD  Anticoagulation Clinic Visit History: Patient does not report signs/symptoms of bleeding or thromboembolism  Other recent changes: No diet, medications, lifestyle changes endorsed.  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 Penicillins Anaphylaxis, Hives, Swelling and Rash    Has patient had a PCN reaction causing immediate rash, facial/tongue/throat swelling, SOB or lightheadedness with hypotension: Yes Has patient had a PCN reaction causing severe rash involving mucus membranes or skin necrosis: Yes Has patient had a PCN reaction that required hospitalization Yes Has patient had a PCN reaction occurring within the last 10 years: No If all of the above answers are "NO", then may proceed with Cephalosporin use.   . Cabbage Itching  . Keflex [Cephalexin] Hives    And feeling of throat tightness  . Shellfish Allergy Swelling  . Tomato Swelling  . Latex Itching and Rash    Current Outpatient Medications:  .  budesonide-formoterol (SYMBICORT) 160-4.5 MCG/ACT inhaler, Inhale 2 puffs into the lungs 2 (two) times daily., Disp: 3 Inhaler, Rfl: 4 .  calcium citrate-vitamin D (CITRACAL+D) 315-200 MG-UNIT tablet, Take 2 tablets by mouth daily., Disp: 180  tablet, Rfl: 2 .  carvedilol (COREG) 12.5 MG tablet, TAKE 1 TABLET BY MOUTH TWICE A DAY, Disp: 180 tablet, Rfl: 3 .  cycloSPORINE (RESTASIS) 0.05 % ophthalmic emulsion, Place 1 drop into both eyes 2 (two) times daily., Disp: 1.5 mL, Rfl: 1 .  EPINEPHrine 0.3 mg/0.3 mL IJ SOAJ injection, Inject 0.3 mg into the muscle as needed. Severe allergic reactions, Disp: , Rfl: 0 .  fluticasone (FLONASE) 50 MCG/ACT nasal spray, PLACE 2 SPRAYS DAILY INTO BOTH NOSTRILS., Disp: 48 mL, Rfl: 1 .  hydrocortisone (PROCTOSOL HC) 2.5 % rectal cream, Place 1 application rectally 2 (two) times daily., Disp: 30 g, Rfl: 0 .  meclizine (ANTIVERT) 25 MG tablet, TAKE 1 TABLET BY MOUTH THREE TIMES A DAY AS NEEDED, Disp: 90 tablet, Rfl: 1 .  montelukast (SINGULAIR) 10 MG tablet, Take 1 tablet (10 mg total) by mouth at bedtime., Disp: 90 tablet, Rfl: 3 .  pantoprazole (PROTONIX) 40 MG tablet, TAKE 1 TABLET BY MOUTH EVERY DAY, Disp: 90 tablet, Rfl: 1 .  PROAIR HFA 108 (90 Base) MCG/ACT inhaler, TAKE 2 PUFFS BY MOUTH EVERY 6 HOURS AS NEEDED FOR WHEEZE OR SHORTNESS OF BREATH (Patient taking differently: Inhale 2 puffs into the lungs every 6 (six) hours as needed for wheezing or shortness of breath. ), Disp: 8.5 Inhaler, Rfl: 2 .  Psyllium (METAMUCIL PO), Take by mouth at bedtime. gummies, Disp: , Rfl:  .  rosuvastatin (CRESTOR) 20 MG tablet, TAKE 1 TABLET BY MOUTH EVERY DAY, Disp: 90 tablet, Rfl: 3 .  warfarin (  COUMADIN) 4 MG tablet, Take one (1) 4 mg blue tablet on Sundays, Tuesdays, and Thursdays. All other days, take only 1/2 tablet., Disp: 90 tablet, Rfl: 3 .  losartan (COZAAR) 25 MG tablet, Take 1 tablet (25 mg total) by mouth daily., Disp: 90 tablet, Rfl: 3 Past Medical History:  Diagnosis Date  . Arthritis   . Asthma   . Bronchitis   . CHF (congestive heart failure) (HCC)   . Diabetes (HCC) 2019  . Diverticulitis   . DVT, lower extremity, recurrent (HCC)    Patient had unprovoked PE on 2002 and DVT in right lower  extremety 2008.  [name] GERD (gastroesophageal reflux disease)   . History of kidney stones   . Hypertension   . PE (pulmonary embolism)    Patient had unprovoked PE on 2002  . PONV (postoperative nausea and vomiting)   . Vertigo    Social History   Socioeconomic History  . Marital status: Divorced    Spouse name: Not on file  . Number of children: 5  . Years of education: 9 th grade  . Highest education level: 9th grade  Occupational History  . Occupation: retired  Tobacco Use  . Smoking status: Former Smoker    Types: Cigarettes    Quit date: 09/18/1988    Years since quitting: 30.8  . Smokeless tobacco: Never Used  . Tobacco comment: 6 pack year smoking history as a teen  Vaping Use  . Vaping Use: Never used  Substance and Sexual Activity  . Alcohol use: No  . Drug use: No  . Sexual activity: Never    Birth control/protection: None  Other Topics Concern  . Not on file  Social History Narrative   Current Social History 05/17/2017        Patient lives with 25 yo granddaughter" in a/an home / condo / townhome which is 1 story/stories. There are not steps up to the entrance the patient uses.       Patient's method of transportation is church member.      The highest level of education was some high school.      The patient currently retired.      Identified important Relationships are God, family       Pets : 1 lab/pitt mix named Cinnamon       Interests / Fun: Church,TV       Current Stressors: "Me and my granddaughter"       Religious / Personal Beliefs: I'm Holiness and I love people"       Other: "I'd give away my last dime."    Social Determinants of Health   Financial Resource Strain:   . Difficulty of Paying Living Expenses:   Food Insecurity:   . Worried About Programme researcher, broadcasting/film/video in the Last Year:   . Barista in the Last Year:   Transportation Needs:   . Freight forwarder (Medical):   [name] Lack of Transportation (Non-Medical):   Physical  Activity:   . Days of Exercise per Week:   . Minutes of Exercise per Session:   Stress:   . Feeling of Stress :   Social Connections:   . Frequency of Communication with Friends and Family:   . Frequency of Social Gatherings with Friends and Family:   . Attends Religious Services:   . Active Member of Clubs or Organizations:   . Attends Banker Meetings:   [name] Marital Status:    Family  History  Problem Relation Age of Onset  . Cancer Mother   . Hypertension Sister   . Diabetes Sister   . Hypertension Brother   . Diabetes Brother   . Hypertension Sister   . Diabetes Sister   . Colon cancer Other   . Heart Problems Other 34       sister's child, open heart surgery  . CVA Father   . Diabetes Son   . Hypertension Son   . Kidney disease Son        on dialysis  . Hypertension Sister   . Diabetes Sister   . Hypertension Sister   . Diabetes Sister   . Cancer Brother   . Hypertension Son   . Diabetes Son   . Hypertension Daughter   . Schizophrenia Daughter     ASSESSMENT Recent Results: The most recent result is correlated with 20 mg per week: Lab Results  Component Value Date   INR 2.4 07/22/2019   INR 2.4 06/24/2019   INR 2.4 06/03/2019    Anticoagulation Dosing: Description   Take one (1) 4 mg blue tablet on Sundays, Tuesdays, and Thursdays.All other days, take only 1/2 tablet.     INR today: Therapeutic  PLAN Weekly dose was unchanged.   Patient Instructions  Patient instructed to take medications as defined in the Anti-coagulation Track section of this encounter.  Patient instructed to take today's dose.  Patient instructed to take one (1) 4 mg blue tablet on Sundays, Tuesdays, and Thursdays.All other days, take only 1/2 tablet. Patient verbalized understanding of these instructions.    Patient advised to contact clinic or seek medical attention if signs/symptoms of bleeding or thromboembolism occur.  Patient verbalized understanding by  repeating back information and was advised to contact me if further medication-related questions arise. Patient was also provided an information handout.  Follow-up Return in 4 weeks (on 08/19/2019) for Follow up INR.  Elicia Lamp, PharmD, CPP  15 minutes spent face-to-face with the patient during the encounter. 50% of time spent on education, including signs/sx bleeding and clotting, as well as food and drug interactions with warfarin. 50% of time was spent on fingerprick POC INR sample collection,processing, results determination, and documentation in [email or web address].

## 2019-07-22 NOTE — Patient Instructions (Signed)
Patient instructed to take medications as defined in the Anti-coagulation Track section of this encounter.  Patient instructed to take today's dose.  Patient instructed to take one (1) 4 mg blue tablet on Sundays, Tuesdays, and Thursdays.All other days, take only 1/2 tablet. Patient verbalized understanding of these instructions.

## 2019-07-23 NOTE — Progress Notes (Signed)
INTERNAL MEDICINE TEACHING ATTENDING ADDENDUM  I agree with pharmacy recommendations outlined above. Darra Rosa, MD 

## 2019-08-01 ENCOUNTER — Other Ambulatory Visit: Payer: Self-pay | Admitting: Internal Medicine

## 2019-08-01 DIAGNOSIS — R7309 Other abnormal glucose: Secondary | ICD-10-CM

## 2019-08-01 DIAGNOSIS — E118 Type 2 diabetes mellitus with unspecified complications: Secondary | ICD-10-CM

## 2019-08-14 ENCOUNTER — Telehealth: Payer: Self-pay

## 2019-08-14 NOTE — Telephone Encounter (Signed)
Please call pt back.

## 2019-08-16 NOTE — Telephone Encounter (Signed)
Patient scheduled for tele AWV for 08/27/2019 at 1:15. Kinnie Feil, BSN, RN-BC

## 2019-08-17 ENCOUNTER — Other Ambulatory Visit: Payer: Self-pay | Admitting: Internal Medicine

## 2019-08-17 DIAGNOSIS — J4521 Mild intermittent asthma with (acute) exacerbation: Secondary | ICD-10-CM

## 2019-08-17 DIAGNOSIS — R7309 Other abnormal glucose: Secondary | ICD-10-CM

## 2019-08-17 DIAGNOSIS — K219 Gastro-esophageal reflux disease without esophagitis: Secondary | ICD-10-CM

## 2019-08-17 DIAGNOSIS — E118 Type 2 diabetes mellitus with unspecified complications: Secondary | ICD-10-CM

## 2019-08-19 ENCOUNTER — Ambulatory Visit (INDEPENDENT_AMBULATORY_CARE_PROVIDER_SITE_OTHER): Payer: Medicare Other | Admitting: Pharmacist

## 2019-08-19 ENCOUNTER — Other Ambulatory Visit: Payer: Self-pay

## 2019-08-19 DIAGNOSIS — Z7901 Long term (current) use of anticoagulants: Secondary | ICD-10-CM | POA: Diagnosis not present

## 2019-08-19 DIAGNOSIS — I82409 Acute embolism and thrombosis of unspecified deep veins of unspecified lower extremity: Secondary | ICD-10-CM

## 2019-08-19 LAB — POCT INR: INR: 3 (ref 2.0–3.0)

## 2019-08-19 MED ORDER — MONTELUKAST SODIUM 10 MG PO TABS: 10.0000 mg | ORAL_TABLET | Freq: Every day | ORAL | 3 refills | Status: DC

## 2019-08-19 MED ORDER — PANTOPRAZOLE SODIUM 40 MG PO TBEC: 40.0000 mg | DELAYED_RELEASE_TABLET | Freq: Every day | ORAL | 1 refills | Status: DC

## 2019-08-19 MED ORDER — RESTASIS 0.05 % OP EMUL: 1.0000 [drp] | Freq: Two times a day (BID) | OPHTHALMIC | 1 refills | Status: DC

## 2019-08-19 NOTE — Patient Instructions (Signed)
Patient instructed to take medications as defined in the Anti-coagulation Track section of this encounter.  Patient instructed to take today's dose.  Patient instructed to take one (1) of your 4 mg blue warfarin tablets on Sundays,  and Wednesdays. All other days, take only 1/2 tablet. Patient verbalized understanding of these instructions.

## 2019-08-19 NOTE — Progress Notes (Signed)
Anticoagulation Management Caroline [name] is a 74 y.o. female who reports to the clinic for monitoring of warfarin treatment.    Indication: DVT, History of (resolved); Long term current use of warfarin for INR target 2.0 - 3.0.   Duration: indefinite Supervising physician: Carlynn Purl  Anticoagulation Clinic Visit History: Patient does not report signs/symptoms of bleeding or thromboembolism  Other recent changes: No diet, medications, lifestyle changes endorsed by the patient at this visit.  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] PULMONARY EMBOLISM HX OF (Resolved) [Z86.718]       Comments:          Allergies  Allergen Reactions   Penicillins Anaphylaxis, Hives, Swelling and Rash    Has patient had a PCN reaction causing immediate rash, facial/tongue/throat swelling, SOB or lightheadedness with hypotension: Yes Has patient had a PCN reaction causing severe rash involving mucus membranes or skin necrosis: Yes Has patient had a PCN reaction that required hospitalization Yes Has patient had a PCN reaction occurring within the last 10 years: No If all of the above answers are "NO", then may proceed with Cephalosporin use.    Cabbage Itching   Keflex [Cephalexin] Hives    And feeling of throat tightness   Shellfish Allergy Swelling   Tomato Swelling   Latex Itching and Rash    Current Outpatient Medications:    budesonide-formoterol (SYMBICORT) 160-4.5 MCG/ACT inhaler, Inhale 2 puffs into the lungs 2 (two) times daily., Disp: 3 Inhaler, Rfl: 4   calcium citrate-vitamin D (CITRACAL+D) 315-200 MG-UNIT tablet, Take 2 tablets by mouth daily.,  Disp: 180 tablet, Rfl: 2   carvedilol (COREG) 12.5 MG tablet, TAKE 1 TABLET BY MOUTH TWICE A DAY, Disp: 180 tablet, Rfl: 3   cycloSPORINE (RESTASIS) 0.05 % ophthalmic emulsion, Place 1 drop into both eyes 2 (two) times daily., Disp: 1.5 mL, Rfl: 1   EPINEPHrine 0.3 mg/0.3 mL IJ SOAJ injection, Inject 0.3 mg into the muscle as needed. Severe allergic reactions, Disp: , Rfl: 0   fluticasone (FLONASE) 50 MCG/ACT nasal spray, PLACE 2 SPRAYS DAILY INTO BOTH NOSTRILS., Disp: 48 mL, Rfl: 1   hydrocortisone (PROCTOSOL HC) 2.5 % rectal cream, Place 1 application rectally 2 (two) times daily., Disp: 30 g, Rfl: 0   meclizine (ANTIVERT) 25 MG tablet, TAKE 1 TABLET BY MOUTH THREE TIMES A DAY AS NEEDED, Disp: 90 tablet, Rfl: 1   montelukast (SINGULAIR) 10 MG tablet, Take 1 tablet (10 mg total) by mouth at bedtime., Disp: 90 tablet, Rfl: 3   pantoprazole (PROTONIX) 40 MG tablet, TAKE 1 TABLET BY MOUTH EVERY DAY, Disp: 90 tablet, Rfl: 1   PROAIR HFA 108 (90 Base) MCG/ACT inhaler, TAKE 2 PUFFS BY MOUTH EVERY 6 HOURS AS NEEDED FOR WHEEZE OR SHORTNESS OF BREATH (Patient taking differently: Inhale 2 puffs into the lungs every 6 (six) hours as needed for wheezing or shortness of breath. ), Disp: 8.5 Inhaler, Rfl: 2   Psyllium (METAMUCIL PO), Take by mouth at bedtime. gummies, Disp: , Rfl:    rosuvastatin (CRESTOR) 20 MG tablet, TAKE 1 TABLET BY MOUTH EVERY DAY, Disp:  90 tablet, Rfl: 3   warfarin (COUMADIN) 4 MG tablet, Take one (1) 4 mg blue tablet on Sundays, Tuesdays, and Thursdays. All other days, take only 1/2 tablet., Disp: 90 tablet, Rfl: 3   losartan (COZAAR) 25 MG tablet, Take 1 tablet (25 mg total) by mouth daily., Disp: 90 tablet, Rfl: 3 Past Medical History:  Diagnosis Date   Arthritis    Asthma    Bronchitis    CHF (congestive heart failure) (HCC)    Diabetes (HCC) 2019   Diverticulitis    DVT, lower extremity, recurrent (HCC)    Patient had unprovoked PE on 2002 and DVT in right  lower extremety 2008.   GERD (gastroesophageal reflux disease)    History of kidney stones    Hypertension    PE (pulmonary embolism)    Patient had unprovoked PE on 2002   PONV (postoperative nausea and vomiting)    Vertigo    Social History   Socioeconomic History   Marital status: Divorced    Spouse name: Not on file   Number of children: 5   Years of education: 9 th grade   Highest education level: 9th grade  Occupational History   Occupation: retired  Tobacco Use   Smoking status: Former Smoker    Types: Cigarettes    Quit date: 09/18/1988    Years since quitting: 30.9   Smokeless tobacco: Never Used   Tobacco comment: 6 pack year smoking history as a teen  Vaping Use   Vaping Use: Never used  Substance and Sexual Activity   Alcohol use: No   Drug use: No   Sexual activity: Never    Birth control/protection: None  Other Topics Concern   Not on file  Social History Narrative   Current Social History 05/17/2017        Patient lives with 44 yo granddaughter" in a/an home / condo / townhome which is 1 story/stories. There are not steps up to the entrance the patient uses.       Patient's method of transportation is church member.      The highest level of education was some high school.      The patient currently retired.      Identified important Relationships are God, family       Pets : 1 lab/pitt mix named Engineer, materials / Fun: Church,TV       Current Stressors: "Me and my granddaughter"       Religious / Personal Beliefs: I'm Holiness and I love people"       Other: "I'd give away my last dime."    Social Determinants of Health   Financial Resource Strain:    Difficulty of Paying Living Expenses:   Food Insecurity:    Worried About Programme researcher, broadcasting/film/video in the Last Year:    Barista in the Last Year:   Transportation Needs:    Freight forwarder (Medical):    Lack of Transportation (Non-Medical):    Physical Activity:    Days of Exercise per Week:    Minutes of Exercise per Session:   Stress:    Feeling of Stress :   Social Connections:    Frequency of Communication with Friends and Family:    Frequency of Social Gatherings with Friends and Family:    Attends Religious Services:    Active Member of Clubs or Organizations:    Attends Banker Meetings:  Marital Status:    Family History  Problem Relation Age of Onset   Cancer Mother    Hypertension Sister    Diabetes Sister    Hypertension Brother    Diabetes Brother    Hypertension Sister    Diabetes Sister    Colon cancer Other    Heart Problems Other 34       sister's child, open heart surgery   CVA Father    Diabetes Son    Hypertension Son    Kidney disease Son        on dialysis   Hypertension Sister    Diabetes Sister    Hypertension Sister    Diabetes Sister    Cancer Brother    Hypertension Son    Diabetes Son    Hypertension Daughter    Schizophrenia Daughter     ASSESSMENT Recent Results: The most recent result is correlated with 20 mg per week: Lab Results  Component Value Date   INR 3.0 08/19/2019   INR 2.4 07/22/2019   INR 2.4 06/24/2019    Anticoagulation Dosing: Description   Take one (1) of your 4 mg blue warfarin tablets on Sundays,  and Wednesdays. All other days, take only 1/2 tablet.     INR today: Therapeutic  PLAN Weekly dose was decreased by 10% to 18 mg per week  Patient Instructions  Patient instructed to take medications as defined in the Anti-coagulation Track section of this encounter.  Patient instructed to take today's dose.  Patient instructed to take one (1) of your 4 mg blue warfarin tablets on Sundays,  and Wednesdays. All other days, take only 1/2 tablet. Patient verbalized understanding of these instructions.    Patient advised to contact clinic or seek medical attention if signs/symptoms of bleeding or  thromboembolism occur.  Patient verbalized understanding by repeating back information and was advised to contact me if further medication-related questions arise. Patient was also provided an information handout.  Follow-up Return in 3 weeks (on 09/09/2019) for Follow up INR.  Elicia Lamp, PharmD, CPP  15 minutes spent face-to-face with the patient during the encounter. 50% of time spent on education, including signs/sx bleeding and clotting, as well as food and drug interactions with warfarin. 50% of time was spent on fingerprick POC INR sample collection,processing, results determination, and documentation in [email or web address].

## 2019-08-19 NOTE — Telephone Encounter (Signed)
Pt states refill request for metformin was sent in error, but CMA unable to remove from list-will send to pcp to review other requested meds and deny metformin if appropriate .Caroline [name], Caroline Cardarelli Cassady8/9/202110:14 AM

## 2019-08-28 ENCOUNTER — Other Ambulatory Visit: Payer: Self-pay

## 2019-08-28 ENCOUNTER — Ambulatory Visit (INDEPENDENT_AMBULATORY_CARE_PROVIDER_SITE_OTHER): Payer: Medicare Other | Admitting: Internal Medicine

## 2019-08-28 ENCOUNTER — Encounter: Payer: Self-pay | Admitting: Internal Medicine

## 2019-08-28 DIAGNOSIS — Z1211 Encounter for screening for malignant neoplasm of colon: Secondary | ICD-10-CM

## 2019-08-28 DIAGNOSIS — Z0000 Encounter for general adult medical examination without abnormal findings: Secondary | ICD-10-CM | POA: Diagnosis not present

## 2019-08-28 DIAGNOSIS — E118 Type 2 diabetes mellitus with unspecified complications: Secondary | ICD-10-CM

## 2019-08-28 DIAGNOSIS — Z6841 Body Mass Index (BMI) 40.0 and over, adult: Secondary | ICD-10-CM

## 2019-08-28 NOTE — Progress Notes (Signed)
This AWV is being conducted by TELEHEALTH - AUDIO only. The patient was located at home and I was located in [facility]. The patient's identity was confirmed using their DOB and current address. The patient or his/her legal guardian has consented to being evaluated through a telephone encounter and understands the associated risks (an examination cannot be done and the patient may need to come in for an appointment) / benefits (allows the patient to remain at home, decreasing exposure to coronavirus). I personally spent 29 minutes conducting the AWV.  Subjective:   Caroline [name] is a 74 y.o. female who presents for a Medicare Annual Wellness Visit.  The following items have been reviewed and updated today in the appropriate area in the EMR.   Health Risk Assessment  Height, weight, BMI, and BP Visual acuity if needed Depression screen Fall risk / safety level Advance directive discussion Medical and family history were reviewed and updated Updating list of other providers & suppliers Medication reconciliation, including over the counter medicines Cognitive screen Written screening schedule Risk Factor list Personalized health advice, risky behaviors, and treatment advice  Social History   Social History Narrative   Current Social History 08/28/2019   Patient lives with 43 yo granddaughter in a one story home. There are not steps up to the entrance the patient uses.       Patient's method of transportation is church member.      The highest level of education was 9th grade      The patient currently retired.      Identified important Relationships are God, family       Pets : 1 lab/pitt mix named Cinnamon       Interests / Fun: Church,TV       Current Stressors: "Me and my granddaughter"       Religious / Personal Beliefs: I'm Holiness and I love people"       Other: "I'd give away my last dime."       L. Ilean Spradlin, BSN, RN-BC          Objective:    Vitals: There were no  vitals taken for this visit. Vitals are unable to obtained due to COVID-19 public health emergency  Activities of Daily Living In your present state of health, do you have any difficulty performing the following activities: 08/28/2019 03/19/2019  Hearing? N N  Vision? N N  Difficulty concentrating or making decisions? N N  Walking or climbing stairs? Y N  Comment Stairs -  Dressing or bathing? N N  Doing errands, shopping? Y N  Some recent data might be hidden    Goals Goals    .  Exercise 5x per week (10-15 min per time) (pt-stated)    .  Weight (lb) < 245 lb (111.1 kg) (pt-stated)      5% weight loss goal      Referral placed to [facility] for continued help with weight loss  Fall Risk Fall Risk  08/28/2019 03/19/2019 01/09/2019 12/19/2018 08/15/2018  Falls in the past year? 0 0 0 0 0  Comment - - - - -  Number falls in past yr: - 0 0 - -  Comment - - - - -  Injury with Fall? - - - - -  Risk Factor Category  - - - - -  Risk for fall due to : Impaired balance/gait;Impaired mobility - Impaired balance/gait;Impaired mobility Impaired balance/gait;Impaired mobility;Impaired vision Impaired balance/gait;Impaired mobility  Risk for fall due  to: Comment - - - - -  Follow up Education provided;Falls prevention discussed - - Falls evaluation completed Falls prevention discussed   CDC Handout on Fall Prevention and Handout on Home Exercise Program, Access codes [email or web address] and [email or web address] mailed to patient with exercise band.   Depression Screen PHQ 2/9 Scores 08/28/2019 03/19/2019 12/19/2018 08/15/2018  PHQ - 2 Score 0 0 0 1  PHQ- 9 Score 0 0 4 -     Cognitive Testing Six-Item Cognitive Screener   "I would like to ask you some questions that ask you to use your memory. I am going to name three objects. Please wait until I say all three words, then repeat them. Remember what they are  because I am going to ask you to name them again in a few minutes. Please repeat these words for me:  APPLE--TABLE--PENNY." (Interviewer may repeat names 3 times if necessary but repetition not scored.)  Did patient correctly repeat all three words? Yes - may proceed with screen  What year is this? Correct What month is this? Correct What day of the week is this? Correct  What were the three objects I asked you to remember? . Apple Correct . Table Correct . Penny Correct  Score one point for each incorrect answer.  A score of 2 or more points warrants additional investigation.  Patient's score 0   Assessment and Plan:     Referral placed to [facility] for continued help with weight loss. Patient made a 5% weight loss goal today. Patient will pick up FOBT on 09/09/2019 when she comes to [facility] Appt made to f/u with PCP 10/01/2019 at 1045 She will begin seated and standing exercises with exercise band to increase strength and balance.   During the course of the visit the patient was educated and counseled about appropriate screening and preventive services as documented in the assessment and plan.  The printed AVS was given to the patient and included an updated screening schedule, a list of risk factors, and personalized health advice.        Fredderick Severance, RN  08/28/2019

## 2019-08-28 NOTE — Patient Instructions (Addendum)
Things That May Be Affecting Your Health:  Alcohol  Hearing loss  Pain    Depression  Home Safety  Sexual Health   Diabetes  Lack of physical activity  Stress   Difficulty with daily activities  Loneliness  Tiredness   Drug use  Medicines  Tobacco use   Falls  Motor Vehicle Safety x Weight   Food choices  Oral Health  Other    YOUR PERSONALIZED HEALTH PLAN : 1. Schedule your next subsequent Medicare Wellness visit in one year 2. Attend all of your regular appointments to address your medical issues 3. Complete the preventative screenings and services 4. Congratulations on your weight loss and the new 5% weight loss goal you made today!! A referral was placed with Christene Slates for continued help with this. 5. Please pick up the stool kit from [facility] on 09/09/2019 when you come to [facility] 6. Appt made to f/u with Dr. Dareen Piano on 10/01/2019 at 10:45 7. Begin seated and standing exercises with exercise band to increase strength and balance.   Annual Wellness Visit                       Medicare Covered Preventative Screenings and Services  Services & Screenings Men and Women Who How Often Need? Date of Last Service Action  Abdominal Aortic Aneurysm Adults with AAA risk factors Once     Alcohol Misuse and Counseling All Adults Screening once a year if no alcohol misuse. Counseling up to 4 face to face sessions.     Bone Density Measurement  Adults at risk for osteoporosis Once every 2 yrs     Lipid Panel Z13.6 All adults without CV disease Once every 5 yrs     Colorectal Cancer   Stool sample or  Colonoscopy All adults 41 and older   Once every year  Every 10 years x  FIT  Depression All Adults Once a year  Today   Diabetes Screening Blood glucose, post glucose load, or GTT Z13.1  All adults at risk  Pre-diabetics  Once per year  Twice per year     Diabetes  Self-Management Training All adults Diabetics 10 hrs first year;  2 hours subsequent years. Requires Copay     Glaucoma  Diabetics  Family history of glaucoma  African Americans 53 yrs +  Hispanic Americans 57 yrs + Annually - requires coppay     Hepatitis C Z72.89 or F19.20  High Risk for HCV  Born between 1945 and 1965  Annually  Once     HIV Z11.4 All adults based on risk  Annually btw ages 45 & 19 regardless of risk  Annually > 65 yrs if at increased risk     Lung Cancer Screening Asymptomatic adults aged 59-77 with 30 pack yr history and current smoker OR quit within the last 15 yrs Annually Must have counseling and shared decision making documentation before first screen     Medical Nutrition Therapy Adults with   Diabetes  Renal disease  Kidney transplant within past 3 yrs 3 hours first year; 2 hours subsequent years     Obesity and Counseling All adults Screening once a year Counseling if BMI 30 or higher  Today x  Tobacco Use Counseling Adults who use tobacco  Up to 8 visits in one year     Vaccines Z23  Hepatitis B  Influenza   Pneumonia  Adults   Once  Once every flu season  Two different vaccines separated by one year     Next Annual Wellness Visit People with Medicare Every year  Today     Services & Screenings Women Who How Often Need  Date of Last Service Action  Mammogram  Z12.31 Women over 40 One baseline ages 35-39. Annually ager 40 yrs+     Pap tests All women Annually if high risk. Every 2 yrs for normal risk women     Screening for cervical cancer with   Pap (Z01.419 nl or Z01.411abnl) &  HPV Z11.51 Women aged 30 to 65 Once every 5 yrs     Screening pelvic and breast exams All women Annually if high risk. Every 2 yrs for normal risk women     Sexually Transmitted Diseases  Chlamydia  Gonorrhea  Syphilis All at risk adults Annually for non pregnant females at increased risk         Services & Screenings Men Who How Ofter Need  Date of Last  Service Action  Prostate Cancer - DRE & PSA Men over 50 Annually.  DRE might require a copay.     Sexually Transmitted Diseases  Syphilis All at risk adults Annually for men at increased risk            Diabetes Mellitus and Foot Care Foot care is an important part of your health, especially when you have diabetes. Diabetes may cause you to have problems because of poor blood flow (circulation) to your feet and legs, which can cause your skin to:  Become thinner and drier.  Break more easily.  Heal more slowly.  Peel and crack. You may also have nerve damage (neuropathy) in your legs and feet, causing decreased feeling in them. This means that you may not notice minor injuries to your feet that could lead to more serious problems. Noticing and addressing any potential problems early is the best way to prevent future foot problems. How to care for your feet Foot hygiene  Wash your feet daily with warm water and mild soap. Do not use hot water. Then, pat your feet and the areas between your toes until they are completely dry. Do not soak your feet as this can dry your skin.  Trim your toenails straight across. Do not dig under them or around the cuticle. File the edges of your nails with an emery board or nail file.  Apply a moisturizing lotion or petroleum jelly to the skin on your feet and to dry, brittle toenails. Use lotion that does not contain alcohol and is unscented. Do not apply lotion between your toes. Shoes and socks  Wear clean socks or stockings every day. Make sure they are not too tight. Do not wear knee-high stockings since they may decrease blood flow to your legs.  Wear shoes that fit properly and have enough cushioning. Always look in your shoes before you put them on to be sure there are no objects inside.  To break in new shoes, wear them for just a few hours a day. This prevents injuries on your feet. Wounds, scrapes, corns, and calluses  Check your  feet daily for blisters, cuts, bruises, sores, and redness. If you cannot see the bottom of your feet, use a mirror or ask someone for help.  Do not cut corns or calluses or try to remove them with medicine.  If you find a minor scrape, cut, or break in the skin on your feet, keep it and the skin around it clean   and dry. You may clean these areas with mild soap and water. Do not clean the area with peroxide, alcohol, or iodine.  If you have a wound, scrape, corn, or callus on your foot, look at it several times a day to make sure it is healing and not infected. Check for: ? Redness, swelling, or pain. ? Fluid or blood. ? Warmth. ? Pus or a bad smell. General instructions  Do not cross your legs. This may decrease blood flow to your feet.  Do not use heating pads or hot water bottles on your feet. They may burn your skin. If you have lost feeling in your feet or legs, you may not know this is happening until it is too late.  Protect your feet from hot and cold by wearing shoes, such as at the beach or on hot pavement.  Schedule a complete foot exam at least once a year (annually) or more often if you have foot problems. If you have foot problems, report any cuts, sores, or bruises to your health care provider immediately. Contact a health care provider if:  You have a medical condition that increases your risk of infection and you have any cuts, sores, or bruises on your feet.  You have an injury that is not healing.  You have redness on your legs or feet.  You feel burning or tingling in your legs or feet.  You have pain or cramps in your legs and feet.  Your legs or feet are numb.  Your feet always feel cold.  You have pain around a toenail. Get help right away if:  You have a wound, scrape, corn, or callus on your foot and: ? You have pain, swelling, or redness that gets worse. ? You have fluid or blood coming from the wound, scrape, corn, or callus. ? Your wound, scrape,  corn, or callus feels warm to the touch. ? You have pus or a bad smell coming from the wound, scrape, corn, or callus. ? You have a fever. ? You have a red line going up your leg. Summary  Check your feet every day for cuts, sores, red spots, swelling, and blisters.  Moisturize feet and legs daily.  Wear shoes that fit properly and have enough cushioning.  If you have foot problems, report any cuts, sores, or bruises to your health care provider immediately.  Schedule a complete foot exam at least once a year (annually) or more often if you have foot problems. This information is not intended to replace advice given to you by your health care provider. Make sure you discuss any questions you have with your health care provider. Document Revised: 09/19/2018 Document Reviewed: 01/29/2016 Elsevier Patient Education  2020 [employer]   Fall Prevention in the Home, Adult Falls can cause injuries. They can happen to people of all ages. There are many things you can do to make your home safe and to help prevent falls. Ask for help when making these changes, if needed. What actions can I take to prevent falls? General Instructions  Use good lighting in all rooms. Replace any light bulbs that burn out.  Turn on the lights when you go into a dark area. Use night-lights.  Keep items that you use often in easy-to-reach places. Lower the shelves around your home if necessary.  Set up your furniture so you have a clear path. Avoid moving your furniture around.  Do not have throw rugs and other things on the floor that   can make you trip.  Avoid walking on wet floors.  If any of your floors are uneven, fix them.  Add color or contrast paint or tape to clearly mark and help you see: ? Any grab bars or handrails. ? First and last steps of stairways. ? Where the edge of each step is.  If you use a stepladder: ? Make sure that it is fully opened. Do not climb a closed stepladder. ? Make  sure that both sides of the stepladder are locked into place. ? Ask someone to hold the stepladder for you while you use it.  If there are any pets around you, be aware of where they are. What can I do in the bathroom?      Keep the floor dry. Clean up any water that spills onto the floor as soon as it happens.  Remove soap buildup in the tub or shower regularly.  Use non-skid mats or decals on the floor of the tub or shower.  Attach bath mats securely with double-sided, non-slip rug tape.  If you need to sit down in the shower, use a plastic, non-slip stool.  Install grab bars by the toilet and in the tub and shower. Do not use towel bars as grab bars. What can I do in the bedroom?  Make sure that you have a light by your bed that is easy to reach.  Do not use any sheets or blankets that are too big for your bed. They should not hang down onto the floor.  Have a firm chair that has side arms. You can use this for support while you get dressed. What can I do in the kitchen?  Clean up any spills right away.  If you need to reach something above you, use a strong step stool that has a grab bar.  Keep electrical cords out of the way.  Do not use floor polish or wax that makes floors slippery. If you must use wax, use non-skid floor wax. What can I do with my stairs?  Do not leave any items on the stairs.  Make sure that you have a light switch at the top of the stairs and the bottom of the stairs. If you do not have them, ask someone to add them for you.  Make sure that there are handrails on both sides of the stairs, and use them. Fix handrails that are broken or loose. Make sure that handrails are as long as the stairways.  Install non-slip stair treads on all stairs in your home.  Avoid having throw rugs at the top or bottom of the stairs. If you do have throw rugs, attach them to the floor with carpet tape.  Choose a carpet that does not hide the edge of the steps on  the stairway.  Check any carpeting to make sure that it is firmly attached to the stairs. Fix any carpet that is loose or worn. What can I do on the outside of my home?  Use bright outdoor lighting.  Regularly fix the edges of walkways and driveways and fix any cracks.  Remove anything that might make you trip as you walk through a door, such as a raised step or threshold.  Trim any bushes or trees on the path to your home.  Regularly check to see if handrails are loose or broken. Make sure that both sides of any steps have handrails.  Install guardrails along the edges of any raised decks and porches.    Clear walking paths of anything that might make someone trip, such as tools or rocks.  Have any leaves, snow, or ice cleared regularly.  Use sand or salt on walking paths during winter.  Clean up any spills in your garage right away. This includes grease or oil spills. What other actions can I take?  Wear shoes that: ? Have a low heel. Do not wear high heels. ? Have rubber bottoms. ? Are comfortable and fit you well. ? Are closed at the toe. Do not wear open-toe sandals.  Use tools that help you move around (mobility aids) if they are needed. These include: ? Canes. ? Walkers. ? Scooters. ? Crutches.  Review your medicines with your doctor. Some medicines can make you feel dizzy. This can increase your chance of falling. Ask your doctor what other things you can do to help prevent falls. Where to find more information  Centers for Disease Control and Prevention, [employer]: https://cdc.gov  National Institute on Aging: https://go4life.nia.nih.gov Contact a doctor if:  You are afraid of falling at home.  You feel weak, drowsy, or dizzy at home.  You fall at home. Summary  There are many simple things that you can do to make your home safe and to help prevent falls.  Ways to make your home safe include removing tripping hazards and installing grab bars in the  bathroom.  Ask for help when making these changes in your home. This information is not intended to replace advice given to you by your health care provider. Make sure you discuss any questions you have with your health care provider. Document Revised: 04/19/2018 Document Reviewed: 08/11/2016 Elsevier Patient Education  2020 [employer]   Health Maintenance, Female Adopting a healthy lifestyle and getting preventive care are important in promoting health and wellness. Ask your health care provider about:  The right schedule for you to have regular tests and exams.  Things you can do on your own to prevent diseases and keep yourself healthy. What should I know about diet, weight, and exercise? Eat a healthy diet   Eat a diet that includes plenty of vegetables, fruits, low-fat dairy products, and lean protein.  Do not eat a lot of foods that are high in solid fats, added sugars, or sodium. Maintain a healthy weight Body mass index (BMI) is used to identify weight problems. It estimates body fat based on height and weight. Your health care provider can help determine your BMI and help you achieve or maintain a healthy weight. Get regular exercise Get regular exercise. This is one of the most important things you can do for your health. Most adults should:  Exercise for at least 150 minutes each week. The exercise should increase your heart rate and make you sweat (moderate-intensity exercise).  Do strengthening exercises at least twice a week. This is in addition to the moderate-intensity exercise.  Spend less time sitting. Even light physical activity can be beneficial. Watch cholesterol and blood lipids Have your blood tested for lipids and cholesterol at 72 years of age, then have this test every 5 years. Have your cholesterol levels checked more often if:  Your lipid or cholesterol levels are high.  You are older than 72 years of age.  You are at high risk for heart  disease. What should I know about cancer screening? Depending on your health history and family history, you may need to have cancer screening at various ages. This may include screening for:  Breast cancer.  Cervical   cancer.  Colorectal cancer.  Skin cancer.  Lung cancer. What should I know about heart disease, diabetes, and high blood pressure? Blood pressure and heart disease  High blood pressure causes heart disease and increases the risk of stroke. This is more likely to develop in people who have high blood pressure readings, are of African descent, or are overweight.  Have your blood pressure checked: ? Every 3-5 years if you are 18-39 years of age. ? Every year if you are 40 years old or older. Diabetes Have regular diabetes screenings. This checks your fasting blood sugar level. Have the screening done:  Once every three years after age 40 if you are at a normal weight and have a low risk for diabetes.  More often and at a younger age if you are overweight or have a high risk for diabetes. What should I know about preventing infection? Hepatitis B If you have a higher risk for hepatitis B, you should be screened for this virus. Talk with your health care provider to find out if you are at risk for hepatitis B infection. Hepatitis C Testing is recommended for:  Everyone born from 1945 through 1965.  Anyone with known risk factors for hepatitis C. Sexually transmitted infections (STIs)  Get screened for STIs, including gonorrhea and chlamydia, if: ? You are sexually active and are younger than 72 years of age. ? You are older than 72 years of age and your health care provider tells you that you are at risk for this type of infection. ? Your sexual activity has changed since you were last screened, and you are at increased risk for chlamydia or gonorrhea. Ask your health care provider if you are at risk.  Ask your health care provider about whether you are at high  risk for HIV. Your health care provider may recommend a prescription medicine to help prevent HIV infection. If you choose to take medicine to prevent HIV, you should first get tested for HIV. You should then be tested every 3 months for as long as you are taking the medicine. Pregnancy  If you are about to stop having your period (premenopausal) and you may become pregnant, seek counseling before you get pregnant.  Take 400 to 800 micrograms (mcg) of folic acid every day if you become pregnant.  Ask for birth control (contraception) if you want to prevent pregnancy. Osteoporosis and menopause Osteoporosis is a disease in which the bones lose minerals and strength with aging. This can result in bone fractures. If you are 65 years old or older, or if you are at risk for osteoporosis and fractures, ask your health care provider if you should:  Be screened for bone loss.  Take a calcium or vitamin D supplement to lower your risk of fractures.  Be given hormone replacement therapy (HRT) to treat symptoms of menopause. Follow these instructions at home: Lifestyle  Do not use any products that contain nicotine or tobacco, such as cigarettes, e-cigarettes, and chewing tobacco. If you need help quitting, ask your health care provider.  Do not use street drugs.  Do not share needles.  Ask your health care provider for help if you need support or information about quitting drugs. Alcohol use  Do not drink alcohol if: ? Your health care provider tells you not to drink. ? You are pregnant, may be pregnant, or are planning to become pregnant.  If you drink alcohol: ? Limit how much you use to 0-1 drink a day. ?   Limit intake if you are breastfeeding.  Be aware of how much alcohol is in your drink. In the [place] one drink equals one 12 oz bottle of beer (355 mL), one 5 oz glass of wine (148 mL), or one 1 oz glass of hard liquor (44 mL). General instructions  Schedule regular health, dental,  and eye exams.  Stay current with your vaccines.  Tell your health care provider if: ? You often feel depressed. ? You have ever been abused or do not feel safe at home. Summary  Adopting a healthy lifestyle and getting preventive care are important in promoting health and wellness.  Follow your health care provider's instructions about healthy diet, exercising, and getting tested or screened for diseases.  Follow your health care provider's instructions on monitoring your cholesterol and blood pressure. This information is not intended to replace advice given to you by your health care provider. Make sure you discuss any questions you have with your health care provider. Document Revised: 12/20/2017 Document Reviewed: 12/20/2017 Elsevier Patient Education  2020 [employer].  

## 2019-08-28 NOTE — Progress Notes (Signed)
I discussed the AWV findings with the RN who conducted the visit. I was present in the office suite and immediately available to provide assistance and direction throughout the time the service was provided.   

## 2019-08-30 ENCOUNTER — Other Ambulatory Visit: Payer: Self-pay | Admitting: Internal Medicine

## 2019-08-30 DIAGNOSIS — R42 Dizziness and giddiness: Secondary | ICD-10-CM

## 2019-08-30 NOTE — Progress Notes (Signed)
[facility] Attending  Case discussed with Dr. Krienke at the time of the visit.  We reviewed the AWV findings.  I agree with the assessment, diagnosis, and plan of care documented in the AWV note.       

## 2019-09-02 ENCOUNTER — Ambulatory Visit: Payer: Self-pay | Admitting: Licensed Clinical Social Worker

## 2019-09-09 ENCOUNTER — Ambulatory Visit (INDEPENDENT_AMBULATORY_CARE_PROVIDER_SITE_OTHER): Payer: Medicare Other | Admitting: Pharmacist

## 2019-09-09 ENCOUNTER — Other Ambulatory Visit: Payer: Self-pay

## 2019-09-09 ENCOUNTER — Other Ambulatory Visit: Payer: Medicare Other

## 2019-09-09 DIAGNOSIS — I82402 Acute embolism and thrombosis of unspecified deep veins of left lower extremity: Secondary | ICD-10-CM

## 2019-09-09 DIAGNOSIS — Z7901 Long term (current) use of anticoagulants: Secondary | ICD-10-CM | POA: Diagnosis not present

## 2019-09-09 LAB — POCT INR: INR: 2.1 (ref 2.0–3.0)

## 2019-09-09 NOTE — Progress Notes (Signed)
Anticoagulation Management Caroline [name] is a 74 y.o. female who reports to the clinic for monitoring of warfarin treatment.    Indication: DVT, History of (resolved); Long term current use of anticoagulant.  Duration: indefinite Supervising physician: Carlynn Purl  Anticoagulation Clinic Visit History: Patient does not report signs/symptoms of bleeding or thromboembolism  Other recent changes: No diet, medications, lifestyle changes endorsed by the patient at this visit.  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 Penicillins Anaphylaxis, Hives, Swelling and Rash    Has patient had a PCN reaction causing immediate rash, facial/tongue/throat swelling, SOB or lightheadedness with hypotension: Yes Has patient had a PCN reaction causing severe rash involving mucus membranes or skin necrosis: Yes Has patient had a PCN reaction that required hospitalization Yes Has patient had a PCN reaction occurring within the last 10 years: No If all of the above answers are "NO", then may proceed with Cephalosporin use.   . Cabbage Itching  . Keflex [Cephalexin] Hives    And feeling of throat tightness  . Shellfish Allergy Swelling  . Tomato Swelling  . Latex Itching and Rash    Current Outpatient Medications:  .  budesonide-formoterol (SYMBICORT) 160-4.5 MCG/ACT inhaler, Inhale 2 puffs into the lungs 2 (two) times daily., Disp: 3 Inhaler, Rfl: 4 .  calcium citrate-vitamin D (CITRACAL+D) 315-200 MG-UNIT tablet, Take 2 tablets by mouth daily., Disp: 180 tablet, Rfl:  2 .  carvedilol (COREG) 12.5 MG tablet, TAKE 1 TABLET BY MOUTH TWICE A DAY, Disp: 180 tablet, Rfl: 3 .  cycloSPORINE (RESTASIS) 0.05 % ophthalmic emulsion, Place 1 drop into both eyes 2 (two) times daily., Disp: 1.5 mL, Rfl: 1 .  EPINEPHrine 0.3 mg/0.3 mL IJ SOAJ injection, Inject 0.3 mg into the muscle as needed. Severe allergic reactions, Disp: , Rfl: 0 .  fluticasone (FLONASE) 50 MCG/ACT nasal spray, PLACE 2 SPRAYS DAILY INTO BOTH NOSTRILS., Disp: 48 mL, Rfl: 1 .  hydrocortisone (PROCTOSOL HC) 2.5 % rectal cream, Place 1 application rectally 2 (two) times daily., Disp: 30 g, Rfl: 0 .  meclizine (ANTIVERT) 25 MG tablet, TAKE 1 TABLET BY MOUTH THREE TIMES A DAY AS NEEDED, Disp: 90 tablet, Rfl: 1 .  montelukast (SINGULAIR) 10 MG tablet, Take 1 tablet (10 mg total) by mouth at bedtime., Disp: 90 tablet, Rfl: 3 .  pantoprazole (PROTONIX) 40 MG tablet, Take 1 tablet (40 mg total) by mouth daily., Disp: 90 tablet, Rfl: 1 .  PROAIR HFA 108 (90 Base) MCG/ACT inhaler, TAKE 2 PUFFS BY MOUTH EVERY 6 HOURS AS NEEDED FOR WHEEZE OR SHORTNESS OF BREATH (Patient taking differently: Inhale 2 puffs into the lungs every 6 (six) hours as needed for wheezing or shortness of breath. ), Disp: 8.5 Inhaler, Rfl: 2 .  Psyllium (METAMUCIL PO), Take by mouth at bedtime. gummies, Disp: , Rfl:  .  rosuvastatin (CRESTOR) 20 MG tablet, TAKE 1 TABLET BY MOUTH EVERY DAY, Disp: 90 tablet, Rfl: 3 .  warfarin (COUMADIN) 4 MG tablet, Take one (1) 4 mg blue tablet on Sundays, Tuesdays, and Thursdays. All other days, take only 1/2 tablet., Disp: 90 tablet, Rfl: 3 .  losartan (COZAAR) 25 MG tablet, Take 1 tablet (25 mg total) by mouth daily., Disp: 90 tablet, Rfl: 3 Past Medical History:  Diagnosis Date  . Arthritis   . Asthma   . Bronchitis   . CHF (congestive heart failure) (HCC)   . Diabetes (HCC) 2019  . Diverticulitis   . DVT, lower extremity, recurrent (HCC)    Patient had unprovoked PE on 2002 and DVT in right lower  extremety 2008.  [name] GERD (gastroesophageal reflux disease)   . History of kidney stones   . Hypertension   . PE (pulmonary embolism)    Patient had unprovoked PE on 2002  . PONV (postoperative nausea and vomiting)   . Vertigo    Social History   Socioeconomic History  . Marital status: Divorced    Spouse name: Not on file  . Number of children: 5  . Years of education: 9 th grade  . Highest education level: 9th grade  Occupational History  . Occupation: retired  Tobacco Use  . Smoking status: Former Smoker    Types: Cigarettes    Quit date: 09/18/1988    Years since quitting: 30.9  . Smokeless tobacco: Never Used  . Tobacco comment: 6 pack year smoking history as a teen  Vaping Use  . Vaping Use: Never used  Substance and Sexual Activity  . Alcohol use: No  . Drug use: No  . Sexual activity: Not Currently    Birth control/protection: None  Other Topics Concern  . Not on file  Social History Narrative   Current Social History 08/28/2019   Patient lives with 30 yo granddaughter in a one story home. There are not steps up to the entrance the patient uses.       Patient's method of transportation is church member.      The highest level of education was 9th grade      The patient currently retired.      Identified important Relationships are God, family       Pets : 1 lab/pitt mix named Cinnamon       Interests / Fun: Church,TV       Current Stressors: "Me and my granddaughter"       Religious / Personal Beliefs: I'm Holiness and I love people"       Other: "I'd give away my last dime."       L. Ducatte, BSN, RN-BC    Social Determinants of Health   Financial Resource Strain:   . Difficulty of Paying Living Expenses: Not on file  Food Insecurity:   . Worried About Programme researcher, broadcasting/film/video in the Last Year: Not on file  . Ran Out of Food in the Last Year: Not on file  Transportation Needs:   . Lack of Transportation (Medical): Not on file  . Lack of  Transportation (Non-Medical): Not on file  Physical Activity:   . Days of Exercise per Week: Not on file  . Minutes of Exercise per Session: Not on file  Stress:   . Feeling of Stress : Not on file  Social Connections:   . Frequency of Communication with Friends and Family: Not on file  . Frequency of Social Gatherings with Friends and Family: Not on file  . Attends Religious Services: Not on file  . Active  Member of Clubs or Organizations: Not on file  . Attends Banker Meetings: Not on file  . Marital Status: Not on file   Family History  Problem Relation Age of Onset  . Cancer Mother   . Hypertension Sister   . Diabetes Sister   . Hypertension Brother   . Diabetes Brother   . Hypertension Sister   . Diabetes Sister   . Colon cancer Other   . Heart Problems Other 34       sister's child, open heart surgery  . CVA Father   . Diabetes Son   . Hypertension Son   . Kidney disease Son        on dialysis  . Hypertension Sister   . Diabetes Sister   . Hypertension Sister   . Diabetes Sister   . Cancer Brother   . Hypertension Son   . Diabetes Son   . Hypertension Daughter   . Schizophrenia Daughter     ASSESSMENT Recent Results: The most recent result is correlated with 18 mg per week: Lab Results  Component Value Date   INR 2.1 09/09/2019   INR 3.0 08/19/2019   INR 2.4 07/22/2019    Anticoagulation Dosing: Description   Take one (1) of your 4 mg blue warfarin tablets on Mondays, Wednesdays and Fridays. All other days, take only one-half (1/2) of your 4mg  blue warfarin tablets.      INR today: Therapeutic  PLAN Weekly dose was increased by 10% to 20 mg per week  Patient Instructions  Patient instructed to take medications as defined in the Anti-coagulation Track section of this encounter.  Patient instructed to take today's dose.  Patient instructed to take one (1) of your 4 mg blue warfarin tablets on Mondays, Wednesdays and Fridays. All other  days, take only one-half (1/2) of your 4mg  blue warfarin tablets.  Patient verbalized understanding of these instructions.    Patient advised to contact clinic or seek medical attention if signs/symptoms of bleeding or thromboembolism occur.  Patient verbalized understanding by repeating back information and was advised to contact me if further medication-related questions arise. Patient was also provided an information handout.  Follow-up Return in 3 weeks (on 09/30/2019) for Follow up INR.  , PharmD, CPP  15 minutes spent face-to-face with the patient during the encounter. 50% of time spent on education, including signs/sx bleeding and clotting, as well as food and drug interactions with warfarin. 50% of time was spent on fingerprick POC INR sample collection,processing, results determination, and documentation in 10/02/2019.

## 2019-09-09 NOTE — Patient Instructions (Signed)
Patient instructed to take medications as defined in the Anti-coagulation Track section of this encounter.  Patient instructed to take today's dose.  Patient instructed to take one (1) of your 4 mg blue warfarin tablets on Mondays, Wednesdays and Fridays. All other days, take only one-half (1/2) of your 4mg blue warfarin tablets. Patient verbalized understanding of these instructions.   

## 2019-09-12 ENCOUNTER — Other Ambulatory Visit: Payer: Self-pay | Admitting: Internal Medicine

## 2019-09-12 DIAGNOSIS — E118 Type 2 diabetes mellitus with unspecified complications: Secondary | ICD-10-CM

## 2019-09-12 DIAGNOSIS — R7309 Other abnormal glucose: Secondary | ICD-10-CM

## 2019-09-29 DIAGNOSIS — Z1211 Encounter for screening for malignant neoplasm of colon: Secondary | ICD-10-CM | POA: Diagnosis not present

## 2019-09-30 ENCOUNTER — Other Ambulatory Visit: Payer: Medicare Other

## 2019-09-30 ENCOUNTER — Ambulatory Visit (INDEPENDENT_AMBULATORY_CARE_PROVIDER_SITE_OTHER): Payer: Medicare Other | Admitting: Pharmacist

## 2019-09-30 ENCOUNTER — Other Ambulatory Visit: Payer: Self-pay

## 2019-09-30 ENCOUNTER — Encounter: Payer: Self-pay | Admitting: Internal Medicine

## 2019-09-30 DIAGNOSIS — I82409 Acute embolism and thrombosis of unspecified deep veins of unspecified lower extremity: Secondary | ICD-10-CM

## 2019-09-30 DIAGNOSIS — Z7901 Long term (current) use of anticoagulants: Secondary | ICD-10-CM

## 2019-09-30 DIAGNOSIS — Z1211 Encounter for screening for malignant neoplasm of colon: Secondary | ICD-10-CM

## 2019-09-30 LAB — POCT INR: INR: 2 (ref 2.0–3.0)

## 2019-09-30 NOTE — Patient Instructions (Signed)
Patient instructed to take medications as defined in the Anti-coagulation Track section of this encounter.  Patient instructed to take today's dose.  Patient instructed to take one (1) of your 4 mg blue warfarin tablets on Sundays, Tuesdays, Thursdays, Saturdays. All other days, take only one-half (1/2) of your 4mg  blue warfarin tablets.  Patient verbalized understanding of these instructions.

## 2019-09-30 NOTE — Progress Notes (Signed)
Anticoagulation Management Caroline [name] is a 74 y.o. female who reports to the clinic for monitoring of warfarin treatment.    Indication: DVT (resolved): long term anticoagulation management  Duration: indefinite Supervising physician: Carlynn Purl  Anticoagulation Clinic Visit History: Patient does not report signs/symptoms of bleeding or thromboembolism  Other recent changes: no diet, medications, lifestyle changes endorsed by patient Anticoagulation Episode Summary    Current INR goal:  2.0-3.0  TTR:  82.9 % (4.6 y)  Next INR check:  10/21/2019  INR from last check:  2.0 (09/30/2019)  Weekly max warfarin dose:    Target end date:    INR check location:  Anticoagulation Clinic  Preferred lab:    Send INR reminders to:  [facility]   Indications   DVT lower extremity recurrent (HCC) [I82.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 Penicillins Anaphylaxis, Hives, Swelling and Rash    Has patient had a PCN reaction causing immediate rash, facial/tongue/throat swelling, SOB or lightheadedness with hypotension: Yes Has patient had a PCN reaction causing severe rash involving mucus membranes or skin necrosis: Yes Has patient had a PCN reaction that required hospitalization Yes Has patient had a PCN reaction occurring within the last 10 years: No If all of the above answers are "NO", then may proceed with Cephalosporin use.   . Cabbage Itching  . Keflex [Cephalexin] Hives    And feeling of throat tightness  . Shellfish Allergy Swelling  . Tomato Swelling  . Latex Itching and Rash    Current Outpatient Medications:  .  budesonide-formoterol (SYMBICORT) 160-4.5 MCG/ACT inhaler, Inhale 2 puffs into the lungs 2 (two) times daily., Disp: 3 Inhaler, Rfl: 4 .  calcium citrate-vitamin D (CITRACAL+D) 315-200 MG-UNIT tablet, Take 2 tablets by mouth daily., Disp: 180 tablet, Rfl: 2 .  carvedilol (COREG) 12.5 MG  tablet, TAKE 1 TABLET BY MOUTH TWICE A DAY, Disp: 180 tablet, Rfl: 3 .  cycloSPORINE (RESTASIS) 0.05 % ophthalmic emulsion, Place 1 drop into both eyes 2 (two) times daily., Disp: 1.5 mL, Rfl: 1 .  EPINEPHrine 0.3 mg/0.3 mL IJ SOAJ injection, Inject 0.3 mg into the muscle as needed. Severe allergic reactions, Disp: , Rfl: 0 .  fluticasone (FLONASE) 50 MCG/ACT nasal spray, PLACE 2 SPRAYS DAILY INTO BOTH NOSTRILS., Disp: 48 mL, Rfl: 1 .  hydrocortisone (PROCTOSOL HC) 2.5 % rectal cream, Place 1 application rectally 2 (two) times daily., Disp: 30 g, Rfl: 0 .  meclizine (ANTIVERT) 25 MG tablet, TAKE 1 TABLET BY MOUTH THREE TIMES A DAY AS NEEDED, Disp: 90 tablet, Rfl: 1 .  montelukast (SINGULAIR) 10 MG tablet, Take 1 tablet (10 mg total) by mouth at bedtime., Disp: 90 tablet, Rfl: 3 .  pantoprazole (PROTONIX) 40 MG tablet, Take 1 tablet (40 mg total) by mouth daily., Disp: 90 tablet, Rfl: 1 .  PROAIR HFA 108 (90 Base) MCG/ACT inhaler, TAKE 2 PUFFS BY MOUTH EVERY 6 HOURS AS NEEDED FOR WHEEZE OR SHORTNESS OF BREATH (Patient taking differently: Inhale 2 puffs into the lungs every 6 (six) hours as needed for wheezing or shortness of breath. ), Disp: 8.5 Inhaler, Rfl: 2 .  Psyllium (METAMUCIL PO), Take by mouth at bedtime. gummies, Disp: , Rfl:  .  rosuvastatin (CRESTOR) 20 MG tablet, TAKE 1 TABLET BY MOUTH EVERY DAY, Disp: 90 tablet, Rfl: 3 .  warfarin (COUMADIN) 4 MG tablet, Take one (1)  4 mg blue tablet on Sundays, Tuesdays, and Thursdays. All other days, take only 1/2 tablet., Disp: 90 tablet, Rfl: 3 .  losartan (COZAAR) 25 MG tablet, Take 1 tablet (25 mg total) by mouth daily., Disp: 90 tablet, Rfl: 3 Past Medical History:  Diagnosis Date  . Arthritis   . Asthma   . Bronchitis   . CHF (congestive heart failure) (HCC)   . Diabetes (HCC) 2019  . Diverticulitis   . DVT, lower extremity, recurrent (HCC)    Patient had unprovoked PE on 2002 and DVT in right lower extremety 2008.  [name] GERD  (gastroesophageal reflux disease)   . History of kidney stones   . Hypertension   . PE (pulmonary embolism)    Patient had unprovoked PE on 2002  . PONV (postoperative nausea and vomiting)   . Vertigo    Social History   Socioeconomic History  . Marital status: Divorced    Spouse name: Not on file  . Number of children: 5  . Years of education: 9 th grade  . Highest education level: 9th grade  Occupational History  . Occupation: retired  Tobacco Use  . Smoking status: Former Smoker    Types: Cigarettes    Quit date: 09/18/1988    Years since quitting: 31.0  . Smokeless tobacco: Never Used  . Tobacco comment: 6 pack year smoking history as a teen  Vaping Use  . Vaping Use: Never used  Substance and Sexual Activity  . Alcohol use: No  . Drug use: No  . Sexual activity: Not Currently    Birth control/protection: None  Other Topics Concern  . Not on file  Social History Narrative   Current Social History 08/28/2019   Patient lives with 18 yo granddaughter in a one story home. There are not steps up to the entrance the patient uses.       Patient's method of transportation is church member.      The highest level of education was 9th grade      The patient currently retired.      Identified important Relationships are God, family       Pets : 1 lab/pitt mix named Cinnamon       Interests / Fun: Church,TV       Current Stressors: "Me and my granddaughter"       Religious / Personal Beliefs: I'm Holiness and I love people"       Other: "I'd give away my last dime."       L. Ducatte, BSN, RN-BC    Social Determinants of Health   Financial Resource Strain:   . Difficulty of Paying Living Expenses: Not on file  Food Insecurity:   . Worried About Programme researcher, broadcasting/film/video in the Last Year: Not on file  . Ran Out of Food in the Last Year: Not on file  Transportation Needs:   . Lack of Transportation (Medical): Not on file  . Lack of Transportation (Non-Medical): Not on  file  Physical Activity:   . Days of Exercise per Week: Not on file  . Minutes of Exercise per Session: Not on file  Stress:   . Feeling of Stress : Not on file  Social Connections:   . Frequency of Communication with Friends and Family: Not on file  . Frequency of Social Gatherings with Friends and Family: Not on file  . Attends Religious Services: Not on file  . Active Member of Clubs or Organizations: Not on file  .  Attends Banker Meetings: Not on file  . Marital Status: Not on file   Family History  Problem Relation Age of Onset  . Cancer Mother   . Hypertension Sister   . Diabetes Sister   . Hypertension Brother   . Diabetes Brother   . Hypertension Sister   . Diabetes Sister   . Colon cancer Other   . Heart Problems Other 34       sister's child, open heart surgery  . CVA Father   . Diabetes Son   . Hypertension Son   . Kidney disease Son        on dialysis  . Hypertension Sister   . Diabetes Sister   . Hypertension Sister   . Diabetes Sister   . Cancer Brother   . Hypertension Son   . Diabetes Son   . Hypertension Daughter   . Schizophrenia Daughter     ASSESSMENT Recent Results: The most recent result is correlated with 20 mg per week: Lab Results  Component Value Date   INR 2.0 09/30/2019   INR 2.1 09/09/2019   INR 3.0 08/19/2019    Anticoagulation Dosing: Description   Take one (1) of your 4 mg blue warfarin tablets on Sundays, Tuesdays, Thursdays, Saturdays. All other days, take only one-half (1/2) of your 4mg  blue warfarin tablets.      INR today: Therapeutic  PLAN Weekly dose was increased by 10% to 22 mg per week  Patient Instructions  Patient instructed to take medications as defined in the Anti-coagulation Track section of this encounter.  Patient instructed to take today's dose.  Patient instructed to take one (1) of your 4 mg blue warfarin tablets on Sundays, Tuesdays, Thursdays, Saturdays. All other days, take only  one-half (1/2) of your 4mg  blue warfarin tablets.  Patient verbalized understanding of these instructions.    Patient advised to contact clinic or seek medical attention if signs/symptoms of bleeding or thromboembolism occur.  Patient verbalized understanding by repeating back information and was advised to contact me if further medication-related questions arise. Patient was also provided an information handout.  Follow-up Return in 3 weeks (on 10/21/2019) for followup INR.   PGY1 pharmacy resident  15 minutes spent face-to-face with the patient during the encounter. 50% of time spent on education, including signs/sx bleeding and clotting, as well as food and drug interactions with warfarin. 50% of time was spent on fingerprick POC INR sample collection,processing, results determination, and documentation in 12/21/2019.

## 2019-10-01 ENCOUNTER — Telehealth: Payer: Self-pay | Admitting: Internal Medicine

## 2019-10-01 ENCOUNTER — Ambulatory Visit (INDEPENDENT_AMBULATORY_CARE_PROVIDER_SITE_OTHER): Payer: Medicare Other | Admitting: Internal Medicine

## 2019-10-01 ENCOUNTER — Ambulatory Visit (INDEPENDENT_AMBULATORY_CARE_PROVIDER_SITE_OTHER): Payer: Medicare Other | Admitting: Dietician

## 2019-10-01 ENCOUNTER — Encounter: Payer: Self-pay | Admitting: Dietician

## 2019-10-01 VITALS — BP 160/68 | HR 72 | Temp 98.5°F | Ht 61.2 in | Wt 275.6 lb

## 2019-10-01 DIAGNOSIS — E1159 Type 2 diabetes mellitus with other circulatory complications: Secondary | ICD-10-CM | POA: Diagnosis not present

## 2019-10-01 DIAGNOSIS — F329 Major depressive disorder, single episode, unspecified: Secondary | ICD-10-CM

## 2019-10-01 DIAGNOSIS — Z0001 Encounter for general adult medical examination with abnormal findings: Secondary | ICD-10-CM

## 2019-10-01 DIAGNOSIS — E782 Mixed hyperlipidemia: Secondary | ICD-10-CM

## 2019-10-01 DIAGNOSIS — E118 Type 2 diabetes mellitus with unspecified complications: Secondary | ICD-10-CM

## 2019-10-01 DIAGNOSIS — I5042 Chronic combined systolic (congestive) and diastolic (congestive) heart failure: Secondary | ICD-10-CM | POA: Diagnosis not present

## 2019-10-01 DIAGNOSIS — I110 Hypertensive heart disease with heart failure: Secondary | ICD-10-CM | POA: Diagnosis not present

## 2019-10-01 DIAGNOSIS — Z0000 Encounter for general adult medical examination without abnormal findings: Secondary | ICD-10-CM

## 2019-10-01 DIAGNOSIS — I10 Essential (primary) hypertension: Secondary | ICD-10-CM

## 2019-10-01 DIAGNOSIS — I82409 Acute embolism and thrombosis of unspecified deep veins of unspecified lower extremity: Secondary | ICD-10-CM

## 2019-10-01 DIAGNOSIS — K5792 Diverticulitis of intestine, part unspecified, without perforation or abscess without bleeding: Secondary | ICD-10-CM

## 2019-10-01 DIAGNOSIS — Z6841 Body Mass Index (BMI) 40.0 and over, adult: Secondary | ICD-10-CM

## 2019-10-01 DIAGNOSIS — Z713 Dietary counseling and surveillance: Secondary | ICD-10-CM | POA: Diagnosis not present

## 2019-10-01 LAB — FECAL OCCULT BLOOD, IMMUNOCHEMICAL: Fecal Occult Bld: POSITIVE — AB

## 2019-10-01 LAB — POCT GLYCOSYLATED HEMOGLOBIN (HGB A1C): Hemoglobin A1C: 6.2 % — AB (ref 4.0–5.6)

## 2019-10-01 LAB — GLUCOSE, CAPILLARY: Glucose-Capillary: 116 mg/dL — ABNORMAL HIGH (ref 70–99)

## 2019-10-01 MED ORDER — LOSARTAN POTASSIUM 50 MG PO TABS: 50.0000 mg | ORAL_TABLET | Freq: Every day | ORAL | 0 refills | Status: DC

## 2019-10-01 NOTE — Assessment & Plan Note (Signed)
-  Patient states that she will take her flu vaccine at her pharmacy next month -Patient states that she has not taken the Covid vaccine yet and does not want to take it at this time. We talked about the importance of the Covid vaccine and that she is at high risk for developing severe Covid given her multiple risk factors. Patient states that she will think about getting the Covid vaccine but does not want information at this time -Patient submitted her stool cards yesterday. We will follow up results

## 2019-10-01 NOTE — Assessment & Plan Note (Signed)
BP Readings from Last 3 Encounters:  10/01/19 (!) 160/68  03/19/19 (!) 144/60  03/05/19 138/76    Lab Results  Component Value Date   NA 145 (H) 04/17/2019   K 3.8 04/17/2019   CREATININE 0.78 04/17/2019    Assessment: Blood pressure control:  Uncontrolled Progress toward BP goal:   Deteriorated Comments: Patient is compliant with carvedilol 12.5 mg twice daily as well as losartan 25 mg daily  Plan: Medications:  We will increase losartan to 50 mg daily Educational resources provided:   Self management tools provided:   Other plans: We will check a BMP and repeat blood pressure in 4 weeks

## 2019-10-01 NOTE — Progress Notes (Signed)
  Medical Nutrition Therapy:  Appt start time: 0915 end time:  1000. Total time: 45 Last visit was 2019  Assessment:  Primary concerns today: Caroline [name] is here to check in and be sure she is doing right. She is concerned about her weight which has increased. She omits almost all non-starchy vegetables because of her coumadin. She is active within her home using a cane.2-3 days walks at store - [employer] or [employer] or [employer] Her A1C is controlled but increased since her last check.  ANTHROPOMETRICS: Estimated body mass index is 51.73 kg/m as calculated from the following:   Height as of an earlier encounter on 10/01/19: 5' 1.2" (1.554 m).   Weight as of this encounter: 275 lb 9.6 oz (125 kg).  WEIGHT HISTORY: Wt Readings from Last 10 Encounters:  10/01/19 275 lb 9.6 oz (125 kg)  10/01/19 275 lb 9.6 oz (125 kg)  03/19/19 257 lb 11.2 oz (116.9 kg)  03/05/19 256 lb (116.1 kg)  02/26/19 255 lb 4 oz (115.8 kg)  01/09/19 263 lb 11.2 oz (119.6 kg)  12/31/18 267 lb 3.2 oz (121.2 kg)  12/19/18 267 lb 3.2 oz (121.2 kg)  12/07/18 270 lb (122.5 kg)  08/15/18 270 lb 8 oz (122.7 kg)   SLEEP:with Tv on, reports she sleep well from 10-  MEDICATIONS: metamucil daily, calcium citrate twice daily BLOOD SUGAR: Lab Results  Component Value Date   HGBA1C 6.2 (A) 10/01/2019   HGBA1C 5.5 03/19/2019   HGBA1C 6.1 (A) 03/26/2018   HGBA1C 6.6 (A) 12/11/2017   HGBA1C 6.9 (A) 08/29/2017     DIETARY INTAKE: Usual eating pattern includes 3 meals and reports 1-2 snacks per day, Constipation: not a problem Dining Out (times/week): 1x/week 24-hr recall:  Gets up at 730-8 am Water & pills  12-1 PM- bowl of cereal- cheerios or chex honey oats, 2% lactose free milk; bananas or apple, water 2-3 bottles of water- lemonade 3 PM eats part of dinner 6-7 PM finishes dinner, [place] sausage, grits,pancake and eggs from [employer] Snacks- sun chips  Progress Towards Goal(s):  In progress.   Nutritional  Diagnosis:  NB-1.4 Self-monitoring deficit As related to not weighing or knowing that she had gained weight.  As evidenced by her reprot and increased weight.    Intervention:  Nutrition education about ways to increase her nutrient intake and decrease her calories. . Action Goal:see patient instructions Outcome goal: increased knowledge about vitamin K foods Coordination of care: consider medication to assist her with weight loss  Teaching Method Utilized: Visual, Auditory,Hands on Handouts given during visit include: After visit summary Barriers to learning/adherence to lifestyle change: competing values Demonstrated degree of understanding via:  Teach Back   Monitoring/Evaluation:  Dietary intake, exercise, food records, and body weight in 3 week(s). [name], RD 10/01/2019 5:44 PM.

## 2019-10-01 NOTE — Assessment & Plan Note (Signed)
-  This problem is chronic and stable -Patient's A1c is mildly worse today up to 6.2 -Patient has put on approximately 18 pound since her last visit -I suspect that her worsening A1c is secondary to her weight gain -I explained to the patient importance of diet and exercise. Patient expresses understanding and wants to work on her weight -If patient's A1c remains elevated at her 49-month follow-up we will reinitiate Metformin

## 2019-10-01 NOTE — Telephone Encounter (Signed)
I called Caroline [name] to discuss the results of her FIT test with her.  Patient was noted to have positive occult blood on the test.  I explained to the patient importance of following up with GI to do a colonoscopy.  Patient states that she will call her gastroenterologist to make an appointment to discuss her positive result.  Patient was scheduled to have colonoscopy secondary to her recurrent diverticulitis as well.  I think that a colonoscopy is most appropriate follow-up for this.  We will follow-up GI evaluation.

## 2019-10-01 NOTE — Progress Notes (Signed)
° °  Subjective:    Patient ID: Caroline [name], female    DOB: 1945-03-01, 74 y.o.   MRN: [id number]  HPI  I have seen and examined this patient. Patient is here for routine follow-up of her diabetes and hypertension.  Patient states that she feels well today and is compliant with all her medications. She denies any new complaints currently.  Review of Systems  Constitutional: Negative.   HENT: Negative.   Respiratory: Negative.   Cardiovascular: Negative.   Gastrointestinal: Negative.   Musculoskeletal: Negative.   Neurological: Negative.   Psychiatric/Behavioral: Negative.        Objective:   Physical Exam Constitutional:      Appearance: Normal appearance.  HENT:     Head: Normocephalic and atraumatic.  Cardiovascular:     Rate and Rhythm: Normal rate and regular rhythm.     Heart sounds: Normal heart sounds. No murmur heard.   Pulmonary:     Effort: No respiratory distress.     Breath sounds: Normal breath sounds. No wheezing.  Abdominal:     General: Bowel sounds are normal. There is no distension.     Palpations: Abdomen is soft.     Tenderness: There is no abdominal tenderness.  Musculoskeletal:        General: No swelling or tenderness.     Cervical back: Neck supple.  Lymphadenopathy:     Cervical: No cervical adenopathy.  Neurological:     Mental Status: She is alert and oriented to person, place, and time.  Psychiatric:        Mood and Affect: Mood normal.        Behavior: Behavior normal.           Assessment & Plan:  Please see problem based charting for assessment and plan:

## 2019-10-01 NOTE — Assessment & Plan Note (Signed)
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 Alexandria Lodge for this and her INR yesterday was 2 -No further work-up at this time. We will continue to monitor closely

## 2019-10-01 NOTE — Assessment & Plan Note (Signed)
-  This problem is chronic and worsening -Patient's weight is up approximately 18 pounds since her last visit -She states that she has not been following her diet exercise routine like she was before -We talked about the importance of losing weight and the effect that it has on her hypertension and diabetes -Patient states that she will attempt to lose weight prior to her follow-up visit

## 2019-10-01 NOTE — Assessment & Plan Note (Signed)
-  Patient was supposed to have a colonoscopy for follow-up of her recurrent diverticulitis -She states that she attempted to get this done but the procedure was postponed multiple times secondary to her underlying comorbidities -She does not want to obtain colonoscopy at this time and wants to do stool cards instead -We will discuss this again at her next visit

## 2019-10-01 NOTE — Assessment & Plan Note (Signed)
-  This problem is chronic and stable -Patient has a history of chronic combined systolic and diastolic heart failure -She has no evidence of fluid overload on exam today (no lower extremity edema, no crackles on exam, no shortness of breath) -Patient does state that her feet sometimes get little swollen the end of the day but this resolves on elevating her feet -I suspect that her weight gain is not from fluid. Patient was educated on the warning signs for fluid overload and instructed to call us if these develop -We will continue with losartan, carvedilol. No indication for initiation of diuretic at this time

## 2019-10-01 NOTE — Patient Instructions (Addendum)
  Thank you for your visit today!   Healthy snack ideas- pears,  prunes, raisins, peanut butter, apples and bananas  Lets follow up to see how this is workig for you in 4 weeks.  It sounds like you are thinking about covering or turing the TV off so you get better sleep-   Try eating a vegetable at least a few times a week:  Vegetables Low in Vitamin K  Turnips (Raw or Cooked)  Beets (Raw or Cooked)  Sweet Corn (Raw or Canned)  Onions (Raw or Cooked)  Rutabagas (Raw or Cooked) Pumpkin (Cooked)  Winter Squash, Butternut/Spaghetti (Cooked)  Summer Squash (Cooked)  Potatoes (Cooked)  Sweet Potatoes (Cooked)  Eggplants (Cooked)   Iceberg Lettuce (Raw)  Artichokes 1 medium   Sugar free Wylers; Country time or crystal lite lemonade

## 2019-10-01 NOTE — Patient Instructions (Signed)
-  It was a pleasure seeing you today -I have increased your losartan to 50 mg daily because your blood pressure is elevated -Please follow-up with [place] in 4 weeks for repeat blood pressure check as well as blood work to check your kidney function -Please take your flu vaccine next month -We will check an A1c on you today. -Please call me if you have any questions or concerns or if you need any refills

## 2019-10-01 NOTE — Assessment & Plan Note (Signed)
-  This problem is chronic and improving -Patient states that she feels well currently and her PHQ-9 score today is 0 -No further work-up at this time -We will continue to monitor off medication

## 2019-10-01 NOTE — Assessment & Plan Note (Signed)
-  This problem is chronic and stable -We will continue with Crestor 20 mg daily -We will recheck a lipid panel at her follow-up visit

## 2019-10-04 ENCOUNTER — Other Ambulatory Visit: Payer: Self-pay

## 2019-10-07 MED ORDER — WARFARIN SODIUM 4 MG PO TABS
ORAL_TABLET | ORAL | 3 refills | Status: DC
Start: 2019-10-07 — End: 2021-03-01

## 2019-10-11 ENCOUNTER — Other Ambulatory Visit: Payer: Self-pay | Admitting: Internal Medicine

## 2019-10-11 DIAGNOSIS — J4521 Mild intermittent asthma with (acute) exacerbation: Secondary | ICD-10-CM

## 2019-10-14 ENCOUNTER — Telehealth: Payer: Self-pay | Admitting: Pharmacist

## 2019-10-14 NOTE — Telephone Encounter (Signed)
Thank you! Her appointment with me was moved to 1115 am on 10-28-19

## 2019-10-14 NOTE — Telephone Encounter (Signed)
Called the patient to rescheduled their 11-OCT-21 appt. with me to 18-OCT-21 at 1000h. Will also let Lorelee New, RD, CDE  know as well.

## 2019-10-21 ENCOUNTER — Ambulatory Visit: Payer: Medicare Other | Admitting: Dietician

## 2019-10-21 ENCOUNTER — Ambulatory Visit: Payer: Medicare Other

## 2019-10-23 ENCOUNTER — Other Ambulatory Visit: Payer: Self-pay | Admitting: Internal Medicine

## 2019-10-28 ENCOUNTER — Ambulatory Visit (INDEPENDENT_AMBULATORY_CARE_PROVIDER_SITE_OTHER): Payer: Medicare Other | Admitting: Pharmacist

## 2019-10-28 ENCOUNTER — Other Ambulatory Visit: Payer: Self-pay

## 2019-10-28 ENCOUNTER — Encounter: Payer: Self-pay | Admitting: Dietician

## 2019-10-28 ENCOUNTER — Ambulatory Visit (INDEPENDENT_AMBULATORY_CARE_PROVIDER_SITE_OTHER): Payer: Medicare Other | Admitting: Dietician

## 2019-10-28 DIAGNOSIS — E118 Type 2 diabetes mellitus with unspecified complications: Secondary | ICD-10-CM | POA: Diagnosis not present

## 2019-10-28 DIAGNOSIS — I82409 Acute embolism and thrombosis of unspecified deep veins of unspecified lower extremity: Secondary | ICD-10-CM

## 2019-10-28 DIAGNOSIS — Z23 Encounter for immunization: Secondary | ICD-10-CM | POA: Diagnosis not present

## 2019-10-28 DIAGNOSIS — I82402 Acute embolism and thrombosis of unspecified deep veins of left lower extremity: Secondary | ICD-10-CM | POA: Diagnosis not present

## 2019-10-28 DIAGNOSIS — Z7901 Long term (current) use of anticoagulants: Secondary | ICD-10-CM | POA: Diagnosis not present

## 2019-10-28 DIAGNOSIS — Z713 Dietary counseling and surveillance: Secondary | ICD-10-CM

## 2019-10-28 LAB — POCT INR: INR: 2.9 (ref 2.0–3.0)

## 2019-10-28 NOTE — Progress Notes (Signed)
  Medical Nutrition Therapy:  Appt start time: 1125 end time:  1155. Total time: 30 Last visit- 09/2019  Assessment:  Primary concerns today: Deferred weight until her doctor's appointment tomorrow. She has a scale at home that talks that she has not used in a while. She plans to weigh tomorrow She bought a smaller bowl for her cereal and has been using it instead of the larger one she was using. She states that eating too much chocolate, cookies is a problem for her. She cooks ~ 1 time a month. She orders out or buys food already prepared like roasted chicken, [place]. Eat the right foods, correct amounts of portions for you. She is food insecure and uses food pantries about 1 time a month. Friends also bring her food at times.   Vertigo started while she was in recliner last night. First time in 5+ years. She often fasts before coming to her doctor's appointments. She was looking at TV at 930-10  When vertigo started and it lasted until ~ 3 am, slept in recliner until 6 am,  took medicines, goes back to sleep for 30-45 minutes until 730 . Takes meclizine daily to prevent vertigo.  MEDICINE: her doctor stopped her metformin   SLEEP: "TV now turns itself off"  BLOOD SUGAR: her A1c has increased from 5.5 to 6.2 Lab Results  Component Value Date   HGBA1C 6.2 (A) 10/01/2019   HGBA1C 5.5 03/19/2019   HGBA1C 6.1 (A) 03/26/2018   HGBA1C 6.6 (A) 12/11/2017   HGBA1C 6.9 (A) 08/29/2017     DIETARY INTAKE: Usual eating pattern includes 1- 3 meals and reports 1-2 snacks per day Dining Out (times/week): 1x/week 24-hr recall:  Gets up at 730-8 am Saturday- calls for take out [employer] [place]- egg & sausage biscuit Candied Sweet potatoes-[employer] W [place] & dressing & roll- [employer] [place] or pintos often  Sunday( yesterday's) intake  3-330 PM bowl of cereal, milk 4 PM - banana  6-7 PM  sun chips- unsure how many, apple  couple pieces of chocolate  12-1 am stomach was growling- drank  water   Progress Towards Goal(s):  In progress.   Nutritional Diagnosis:  NB-1.4 Self-monitoring deficit As related to not weighing or knowing that she had gained weight.  As evidenced by her reprot and increased weight.    Intervention:  Nutrition education about balanced meals/snacks, self monitoring, trigger foods and behavior change.  Action Goal:see patient instructions Outcome goal: increased knowledge about vitamin K foods, weight loss, behavior change Coordination of care: consider rybelsus for diabetes medication to assist her with weight loss  Teaching Method Utilized: Visual, Auditory,Hands on Handouts given during visit include: After visit summary Barriers to learning/adherence to lifestyle change: competing values Demonstrated degree of understanding via:  Teach Back   Monitoring/Evaluation:  Dietary intake, exercise, food records, and body weight in 4 week(s). [name], RD 10/28/2019 1:43 PM.

## 2019-10-28 NOTE — Progress Notes (Signed)
Anticoagulation Management Caroline [name] is a 74 y.o. female who reports to the clinic for monitoring of warfarin treatment.    Indication: DVT, History of (recurrent), Resolved; Long term use of anticoagulant warfarin to maintain INR 2.0 - 3.0.  Duration: indefinite Supervising physician: Earl Lagos  Anticoagulation Clinic Visit History: Patient does not report signs/symptoms of bleeding or thromboembolism  Other recent changes: No diet, medications, lifestyle changes endorsed by the patient at this visit.  Anticoagulation Episode Summary    Current INR goal:  2.0-3.0  TTR:  83.2 % (4.7 y)  Next INR check:  10/21/2019  INR from last check:  2.9 (10/28/2019)  Weekly max warfarin dose:    Target end date:    INR check location:  Anticoagulation Clinic  Preferred lab:    Send INR reminders to:  [facility]   Indications   DVT lower extremity recurrent (HCC) [I82.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 Penicillins Anaphylaxis, Hives, Swelling and Rash    Has patient had a PCN reaction causing immediate rash, facial/tongue/throat swelling, SOB or lightheadedness with hypotension: Yes Has patient had a PCN reaction causing severe rash involving mucus membranes or skin necrosis: Yes Has patient had a PCN reaction that required hospitalization Yes Has patient had a PCN reaction occurring within the last 10 years: No If all of the above answers are "NO", then may proceed with Cephalosporin use.   . Cabbage Itching  . Keflex [Cephalexin] Hives    And feeling of throat tightness  . Shellfish Allergy Swelling  . Tomato Swelling  . Latex Itching and Rash    Current Outpatient Medications:  .  calcium citrate-vitamin D (CITRACAL+D) 315-200 MG-UNIT tablet, Take 2 tablets by mouth daily., Disp: 180 tablet, Rfl: 2 .  carvedilol (COREG) 12.5 MG tablet, TAKE 1 TABLET BY MOUTH TWICE A DAY, Disp: 180 tablet,  Rfl: 3 .  EPINEPHrine 0.3 mg/0.3 mL IJ SOAJ injection, Inject 0.3 mg into the muscle as needed. Severe allergic reactions, Disp: , Rfl: 0 .  fluticasone (FLONASE) 50 MCG/ACT nasal spray, PLACE 2 SPRAYS DAILY INTO BOTH NOSTRILS., Disp: 48 mL, Rfl: 1 .  hydrocortisone (PROCTOSOL HC) 2.5 % rectal cream, Place 1 application rectally 2 (two) times daily., Disp: 30 g, Rfl: 0 .  losartan (COZAAR) 50 MG tablet, Take 1 tablet (50 mg total) by mouth daily., Disp: 90 tablet, Rfl: 0 .  meclizine (ANTIVERT) 25 MG tablet, TAKE 1 TABLET BY MOUTH THREE TIMES A DAY AS NEEDED, Disp: 90 tablet, Rfl: 1 .  montelukast (SINGULAIR) 10 MG tablet, Take 1 tablet (10 mg total) by mouth at bedtime., Disp: 90 tablet, Rfl: 3 .  pantoprazole (PROTONIX) 40 MG tablet, Take 1 tablet (40 mg total) by mouth daily., Disp: 90 tablet, Rfl: 1 .  PROAIR HFA 108 (90 Base) MCG/ACT inhaler, TAKE 2 PUFFS BY MOUTH EVERY 6 HOURS AS NEEDED FOR WHEEZE OR SHORTNESS OF BREATH (Patient taking differently: Inhale 2 puffs into the lungs every 6 (six) hours as needed for wheezing or shortness of breath. ), Disp: 8.5 Inhaler, Rfl: 2 .  Psyllium (METAMUCIL PO), Take by mouth at bedtime. gummies, Disp: , Rfl:  .  RESTASIS 0.05 % ophthalmic emulsion, INSTILL 1 DROP INTO BOTH EYES TWICE A DAY, Disp: 60 mL, Rfl: 1 .  rosuvastatin (CRESTOR) 20 MG tablet, TAKE 1 TABLET BY MOUTH EVERY DAY, Disp: 90  tablet, Rfl: 3 .  SYMBICORT 160-4.5 MCG/ACT inhaler, TAKE 2 PUFFS BY MOUTH TWICE A DAY, Disp: 30.6 each, Rfl: 4 .  warfarin (COUMADIN) 4 MG tablet, Take one (1) of your blue, 4mg  strength warfarin tabletst on Sundays, Tuesdays, Thursdays and Saturdays. On Mondays, Wednesdays and Fridays, take only 1/2 tablet., Disp: 74 tablet, Rfl: 3 Past Medical History:  Diagnosis Date  . Arthritis   . Asthma   . Bronchitis   . CHF (congestive heart failure) (HCC)   . Diabetes (HCC) 2019  . Diverticulitis   . DVT, lower extremity, recurrent (HCC)    Patient had unprovoked PE  on 2002 and DVT in right lower extremety 2008.  2009 GERD (gastroesophageal reflux disease)   . Hematuria, microscopic 09/13/2017  . History of kidney stones   . Hypertension   . PE (pulmonary embolism)    Patient had unprovoked PE on 2002  . PONV (postoperative nausea and vomiting)   . Vertigo    Social History   Socioeconomic History  . Marital status: Divorced    Spouse name: Not on file  . Number of children: 5  . Years of education: 9 th grade  . Highest education level: 9th grade  Occupational History  . Occupation: retired  Tobacco Use  . Smoking status: Former Smoker    Types: Cigarettes    Quit date: 09/18/1988    Years since quitting: 31.1  . Smokeless tobacco: Never Used  . Tobacco comment: 6 pack year smoking history as a teen  Vaping Use  . Vaping Use: Never used  Substance and Sexual Activity  . Alcohol use: No  . Drug use: No  . Sexual activity: Not Currently    Birth control/protection: None  Other Topics Concern  . Not on file  Social History Narrative   Current Social History 08/28/2019   Patient lives with 11 yo granddaughter in a one story home. There are not steps up to the entrance the patient uses.       Patient's method of transportation is church member.      The highest level of education was 9th grade      The patient currently retired.      Identified important Relationships are God, family       Pets : 1 lab/pitt mix named Cinnamon       Interests / Fun: Church,TV       Current Stressors: "Me and my granddaughter"       Religious / Personal Beliefs: I'm Holiness and I love people"       Other: "I'd give away my last dime."       L. Ducatte, BSN, RN-BC    Social Determinants of Health   Financial Resource Strain:   . Difficulty of Paying Living Expenses: Not on file  Food Insecurity:   . Worried About 22 in the Last Year: Not on file  . Ran Out of Food in the Last Year: Not on file  Transportation Needs:   . Lack  of Transportation (Medical): Not on file  . Lack of Transportation (Non-Medical): Not on file  Physical Activity:   . Days of Exercise per Week: Not on file  . Minutes of Exercise per Session: Not on file  Stress:   . Feeling of Stress : Not on file  Social Connections:   . Frequency of Communication with Friends and Family: Not on file  . Frequency of Social Gatherings with Friends and Family:  Not on file  . Attends Religious Services: Not on file  . Active Member of Clubs or Organizations: Not on file  . Attends Banker Meetings: Not on file  . Marital Status: Not on file   Family History  Problem Relation Age of Onset  . Cancer Mother   . Hypertension Sister   . Diabetes Sister   . Hypertension Brother   . Diabetes Brother   . Hypertension Sister   . Diabetes Sister   . Colon cancer Other   . Heart Problems Other 34       sister's child, open heart surgery  . CVA Father   . Diabetes Son   . Hypertension Son   . Kidney disease Son        on dialysis  . Hypertension Sister   . Diabetes Sister   . Hypertension Sister   . Diabetes Sister   . Cancer Brother   . Hypertension Son   . Diabetes Son   . Hypertension Daughter   . Schizophrenia Daughter     ASSESSMENT Recent Results: The most recent result is correlated with 22 mg per week: Lab Results  Component Value Date   INR 2.9 10/28/2019   INR 2.0 09/30/2019   INR 2.1 09/09/2019    Anticoagulation Dosing: Description   Take one (1) of your 4 mg blue warfarin tablets on Sundays,  Thursdays, Saturdays. All other days, take only one-half (1/2) of your 4mg  blue warfarin tablets.      INR today: Therapeutic  PLAN Weekly dose was decreased by 9% to 20 mg per week  Patient Instructions  Patient instructed to take medications as defined in the Anti-coagulation Track section of this encounter.  Patient instructed to take today's dose.  Patient instructed to take one (1) of your 4 mg blue warfarin  tablets on Sundays,  Thursdays, Saturdays. All other days, take only one-half (1/2) of your 4mg  blue warfarin tablets.  Patient verbalized understanding of these instructions.    Patient advised to contact clinic or seek medical attention if signs/symptoms of bleeding or thromboembolism occur.  Patient verbalized understanding by repeating back information and was advised to contact me if further medication-related questions arise. Patient was also provided an information handout.  Follow-up Return in 4 weeks (on 11/25/2019) for Follow up INR.  , PharmD, CPP  15 minutes spent face-to-face with the patient during the encounter. 50% of time spent on education, including signs/sx bleeding and clotting, as well as food and drug interactions with warfarin. 50% of time was spent on fingerprick POC INR sample collection,processing, results determination, and documentation in 11/27/2019.

## 2019-10-28 NOTE — Patient Instructions (Signed)
Patient instructed to take medications as defined in the Anti-coagulation Track section of this encounter.  Patient instructed to take today's dose.  Patient instructed to take one (1) of your 4 mg blue warfarin tablets on Sundays,  Thursdays, Saturdays. All other days, take only one-half (1/2) of your 4mg blue warfarin tablets.  Patient verbalized understanding of these instructions.   

## 2019-10-28 NOTE — Patient Instructions (Addendum)
Goals to help me meet my healthier weight goals  1- weigh at least one time a week- you can weigh more if you want  write the date and the weight you get in a pice of paper and bring it with you to our next visit  Keep working on changes to help you eat smaller portions, lower calorie foods to help you  Try to eat something with protein with each meal and snack- chicken, beans, [place], peanut butter, milk   Breaker Springer [phone]

## 2019-10-29 ENCOUNTER — Other Ambulatory Visit: Payer: Self-pay

## 2019-10-29 ENCOUNTER — Encounter: Payer: Self-pay | Admitting: Student

## 2019-10-29 ENCOUNTER — Ambulatory Visit (INDEPENDENT_AMBULATORY_CARE_PROVIDER_SITE_OTHER): Payer: Medicare Other | Admitting: Student

## 2019-10-29 VITALS — BP 148/58 | HR 68 | Temp 99.0°F | Ht 61.5 in | Wt 275.3 lb

## 2019-10-29 DIAGNOSIS — I10 Essential (primary) hypertension: Secondary | ICD-10-CM

## 2019-10-29 DIAGNOSIS — I5042 Chronic combined systolic (congestive) and diastolic (congestive) heart failure: Secondary | ICD-10-CM

## 2019-10-29 DIAGNOSIS — R42 Dizziness and giddiness: Secondary | ICD-10-CM

## 2019-10-29 DIAGNOSIS — Z6841 Body Mass Index (BMI) 40.0 and over, adult: Secondary | ICD-10-CM

## 2019-10-29 MED ORDER — LOSARTAN POTASSIUM-HCTZ 50-12.5 MG PO TABS: 1.0000 | ORAL_TABLET | Freq: Every day | ORAL | 2 refills | Status: DC

## 2019-10-29 MED ORDER — MECLIZINE HCL 25 MG PO TABS: 25.0000 mg | ORAL_TABLET | Freq: Four times a day (QID) | ORAL | 1 refills | Status: DC | PRN

## 2019-10-29 NOTE — Progress Notes (Signed)
   CC: Hypertension follow up  HPI:  [name] is a 74 y.o. with past medical history of hypertension, type 2 diabetes, CHF who is here to the clinic for blood pressure follow-up.  Please see problem based charting for further detail  Past Medical History:  Diagnosis Date  . Arthritis   . Asthma   . Bronchitis   . CHF (congestive heart failure) (HCC)   . Diabetes (HCC) 2019  . Diverticulitis   . DVT, lower extremity, recurrent (HCC)    Patient had unprovoked PE on 2002 and DVT in right lower extremety 2008.  [name] GERD (gastroesophageal reflux disease)   . Hematuria, microscopic 09/13/2017  . History of kidney stones   . Hypertension   . PE (pulmonary embolism)    Patient had unprovoked PE on 2002  . PONV (postoperative nausea and vomiting)   . Vertigo    Review of Systems: As per HPI  Physical Exam:  Vitals:   10/29/19 1012  BP: (!) 148/70  Pulse: 74  Temp: 99 F (37.2 C)  TempSrc: Oral  SpO2: 99%  Weight: 275 lb 4.8 oz (124.9 kg)  Height: 5' 1.5" (1.562 m)   Physical Exam Constitutional:      General: She is not in acute distress.    Appearance: She is obese.     Comments: Sitting on wheelchair  HENT:     Head: Normocephalic.  Eyes:     General:        Right eye: No discharge.        Left eye: No discharge.  Cardiovascular:     Rate and Rhythm: Normal rate and regular rhythm.  Pulmonary:     Effort: No respiratory distress.     Breath sounds: Normal breath sounds. No wheezing.  Abdominal:     General: Bowel sounds are normal.     Palpations: Abdomen is soft.     Tenderness: There is no abdominal tenderness.  Musculoskeletal:        General: No tenderness.     Cervical back: Normal range of motion.     Right lower leg: Edema present.     Left lower leg: Edema present.     Comments: +1 lower extremity edema bilaterally  Skin:    General: Skin is warm.     Coloration: Skin is not jaundiced.  Neurological:     Mental Status: She is alert.    Psychiatric:        Mood and Affect: Mood normal.     Assessment & Plan:   See Encounters Tab for problem based charting.  Patient seen with Dr. Oswaldo Done

## 2019-10-29 NOTE — Assessment & Plan Note (Addendum)
Patient presents to the ED for hypertension follow-up.  She states that she is doing well, and denies chest pain, shortness of breath exertion or abdominal pain.  She is compliant with her medications of losartan 50 mg and carvedilol 12.5 mg twice daily.  She states that she does not check her blood pressure at home.  Her diet consisting of majority of canned food.  Initial blood pressure 148/70 and recheck 148/58  Plan: -Stop losartan 50 mg -Start losartan-HCTZ 50-12.5 mg daily -Continue carvedilol 12.5 mg twice daily -Educated patient on low-salt diet and reduce the amount of canned food which has high salt content -Encourage patient to exercise and lose weight -Repeat BMP -4 weeks follow-up for blood pressure and BMP recheck

## 2019-10-29 NOTE — Assessment & Plan Note (Signed)
Patient is following up with [name] for diet education.  Patient states that she is ambulating at home and also walk with a cane in the grocery store.  I encouraged her to cut back on cancer food.  Also encouraged patient to continue ambulating and exercise much as possible.

## 2019-10-29 NOTE — Patient Instructions (Addendum)
Caroline [name],  It is a pleasure getting to know you today.  Here is a summary of what we talked about:  1.  Hypertension.  Your blood pressure today is 148/70, repeat blood pressure is 148/58.  To better control your blood pressure, I will STOP the losartan 50 mg and START you on losartan-HCTZ 50 mg - 12.5 mg daily.  Continue your carvedilol 12.5 mg twice daily.  Please cut back on your salt intake and canned food.  We repeated blood work to check for your kidney functions and electrolytes.  I will call you for the results.  Please come back in 4 weeks for another blood pressure and blood work check  2.  Weight: Please continue follow-up with [name] on diet education.  Continue to exercise and walk around as much as you can.  Losing weight will help with your blood pressure and also diabetes.  3.  Vertigo: I have increased your meclizine to 25 mg 4 times a day.  Please call us if your vertigo has gotten worse without relief from the meclizine.  Take care  Dr. Cyndie Chime

## 2019-10-29 NOTE — Assessment & Plan Note (Signed)
Patient states that she has had multiple episodes of vertigo in the last few days.  She states that she did not have any episode for a long time.  She contributes this to a death of her niece recently and that put her under a lot of stress.  Patient is taking meclizine and it helped relieve the symptoms of vertigo.  Plan: -Continue meclizine, increase to 4 times a day as needed.

## 2019-10-29 NOTE — Assessment & Plan Note (Signed)
Echocardiogram (03/2019) shows EF 40-45%, grade 1 diastolic dysfunction, and global hypokinesis.  Patient denies shortness of breath on exertion or chest pain.  She has some lower extremity edema and is wearing compression stocking.  Patient is not taking any diuretics.  She saw her cardiologist in February.  Physical exam does not suggest fluid overload.  Lungs are clear to auscultation.  She only has +1 lower extremity edema laterally.  Plan: -Continue carvedilol 12.5 mg twice daily -Start Losartan-HCTZ 50-12.5 mg daily  -Follow-up with her cardiology

## 2019-10-30 LAB — BMP8+ANION GAP
Anion Gap: 9 mmol/L — ABNORMAL LOW (ref 10.0–18.0)
BUN/Creatinine Ratio: 14 (ref 12–28)
BUN: 11 mg/dL (ref 8–27)
CO2: 31 mmol/L — ABNORMAL HIGH (ref 20–29)
Calcium: 9.2 mg/dL (ref 8.7–10.3)
Chloride: 102 mmol/L (ref 96–106)
Creatinine, Ser: 0.78 mg/dL (ref 0.57–1.00)
GFR calc Af Amer: 88 mL/min/{1.73_m2} (ref >59)
GFR calc non Af Amer: 76 mL/min/{1.73_m2} (ref >59)
Glucose: 102 mg/dL — ABNORMAL HIGH (ref 65–99)
Potassium: 4.1 mmol/L (ref 3.5–5.2)
Sodium: 142 mmol/L (ref 134–144)

## 2019-10-30 NOTE — Progress Notes (Signed)
Internal Medicine Clinic Attending  I saw and evaluated the patient.  I personally confirmed the key portions of the history and exam documented by Dr. Nguyen and I reviewed pertinent patient test results.  The assessment, diagnosis, and plan were formulated together and I agree with the documentation in the resident's note.\  

## 2019-10-31 NOTE — Progress Notes (Signed)
INTERNAL MEDICINE TEACHING ATTENDING ADDENDUM - Cindee Mclester M.D  Duration- indefinite, Indication- recurrent VTE, INR- therapeutic. Agree with pharmacy recommendations as outlined in their note.     

## 2019-11-01 ENCOUNTER — Other Ambulatory Visit: Payer: Self-pay | Admitting: Internal Medicine

## 2019-11-01 ENCOUNTER — Encounter: Payer: Self-pay | Admitting: *Deleted

## 2019-11-01 DIAGNOSIS — R42 Dizziness and giddiness: Secondary | ICD-10-CM

## 2019-11-25 ENCOUNTER — Ambulatory Visit (INDEPENDENT_AMBULATORY_CARE_PROVIDER_SITE_OTHER): Payer: Medicare Other | Admitting: Pharmacist

## 2019-11-25 ENCOUNTER — Encounter: Payer: Medicare Other | Admitting: Internal Medicine

## 2019-11-25 ENCOUNTER — Ambulatory Visit: Payer: Medicare Other | Admitting: Dietician

## 2019-11-25 ENCOUNTER — Ambulatory Visit (INDEPENDENT_AMBULATORY_CARE_PROVIDER_SITE_OTHER): Payer: Medicare Other | Admitting: Internal Medicine

## 2019-11-25 ENCOUNTER — Encounter: Payer: Self-pay | Admitting: Internal Medicine

## 2019-11-25 ENCOUNTER — Other Ambulatory Visit: Payer: Self-pay

## 2019-11-25 ENCOUNTER — Ambulatory Visit (INDEPENDENT_AMBULATORY_CARE_PROVIDER_SITE_OTHER): Payer: Medicare Other | Admitting: Dietician

## 2019-11-25 VITALS — BP 136/72 | HR 74 | Temp 98.0°F | Ht 61.5 in | Wt 277.2 lb

## 2019-11-25 DIAGNOSIS — Z6841 Body Mass Index (BMI) 40.0 and over, adult: Secondary | ICD-10-CM | POA: Diagnosis not present

## 2019-11-25 DIAGNOSIS — I82409 Acute embolism and thrombosis of unspecified deep veins of unspecified lower extremity: Secondary | ICD-10-CM | POA: Diagnosis not present

## 2019-11-25 DIAGNOSIS — I10 Essential (primary) hypertension: Secondary | ICD-10-CM

## 2019-11-25 DIAGNOSIS — E118 Type 2 diabetes mellitus with unspecified complications: Secondary | ICD-10-CM | POA: Diagnosis not present

## 2019-11-25 DIAGNOSIS — Z7901 Long term (current) use of anticoagulants: Secondary | ICD-10-CM | POA: Diagnosis not present

## 2019-11-25 DIAGNOSIS — Z713 Dietary counseling and surveillance: Secondary | ICD-10-CM

## 2019-11-25 LAB — POCT INR: INR: 2.8 (ref 2.0–3.0)

## 2019-11-25 NOTE — Patient Instructions (Signed)
Patient instructed to take medications as defined in the Anti-coagulation Track section of this encounter.  Patient instructed to take today's dose.  Patient instructed to take one (1) of your 4 mg blue warfarin tablets on Sundays,  Thursdays, Saturdays. All other days, take only one-half (1/2) of your 4mg  blue warfarin tablets.  Patient verbalized understanding of these instructions.

## 2019-11-25 NOTE — Patient Instructions (Signed)
Thank you, [name] for allowing [place] to provide your care today. Today we discussed blood pressure.    I have ordered the following labs for you:   Lab Orders     BMP8+Anion Gap   Tests ordered today: none  Referrals ordered today:   Referral Orders  No referral(s) requested today     I have ordered the following medication/changed the following medications:   Stop the following medications: There are no discontinued medications.   Start the following medications: No orders of the defined types were placed in this encounter.    Follow up: 3 months    Remember: Please let us know if you have episodes of lightheadedness.   Should you have any questions or concerns please call the internal medicine clinic at [phone].      Thurmon Fair, M.D. [facility]

## 2019-11-25 NOTE — Progress Notes (Signed)
   CC: Hypertension   HPI:Caroline [name] is a 74 y.o. female who presents for evaluation of hypertension. Please see individual problem based A/P for details.  Past Medical History:  Diagnosis Date  . Arthritis   . Asthma   . Bronchitis   . CHF (congestive heart failure) (HCC)   . Diabetes (HCC) 2019  . Diverticulitis   . DVT, lower extremity, recurrent (HCC)    Patient had unprovoked PE on 2002 and DVT in right lower extremety 2008.  [name] GERD (gastroesophageal reflux disease)   . Hematuria, microscopic 09/13/2017  . History of kidney stones   . Hypertension   . PE (pulmonary embolism)    Patient had unprovoked PE on 2002  . PONV (postoperative nausea and vomiting)   . Vertigo    Review of Systems:   Review of Systems  Constitutional: Negative for chills and fever.  Cardiovascular: Negative for chest pain and leg swelling.  Neurological: Negative for dizziness and loss of consciousness.     Physical Exam: Vitals:   11/25/19 1548  BP: 136/72  Pulse: 74  Temp: 98 F (36.7 C)  TempSrc: Oral  SpO2: 100%  Weight: 277 lb 3.2 oz (125.7 kg)  Height: 5' 1.5" (1.562 m)   General: Overweight, no acute distress, very pleasant HEENT: Normocephalic, atraumatic , Conjunctiva nl  Cardiovascular: Normal rate, regular rhythm.  No murmurs, rubs, or gallops Pulmonary : Equal breath sounds, No wheezes, rales, or rhonchi Abdominal: soft, nontender,  bowel sounds present   Assessment & Plan:   See Encounters Tab for problem based charting.  Patient discussed with Dr. Antony Contras

## 2019-11-25 NOTE — Progress Notes (Signed)
Anticoagulation Management Caroline [name] is a 74 y.o. female who reports to the clinic for monitoring of warfarin treatment.    Indication: DVT, previous history of; Long term current use of anticoagulant.  Duration: indefinite Supervising physician: Carlynn Purl  Anticoagulation Clinic Visit History: Patient does not report signs/symptoms of bleeding or thromboembolism  Other recent changes: No diet, medications, lifestyle changes--EXCEPT as noted in patient findings.  Anticoagulation Episode Summary    Current INR goal:  2.0-3.0  TTR:  83.4 % (4.8 y)  Next INR check:  12/23/2019  INR from last check:  2.8 (11/25/2019)  Weekly max warfarin dose:    Target end date:    INR check location:  Anticoagulation Clinic  Preferred lab:    Send INR reminders to:  [facility]   Indications   DVT lower extremity recurrent (HCC) [I82.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 Penicillins Anaphylaxis, Hives, Swelling and Rash    Has patient had a PCN reaction causing immediate rash, facial/tongue/throat swelling, SOB or lightheadedness with hypotension: Yes Has patient had a PCN reaction causing severe rash involving mucus membranes or skin necrosis: Yes Has patient had a PCN reaction that required hospitalization Yes Has patient had a PCN reaction occurring within the last 10 years: No If all of the above answers are "NO", then may proceed with Cephalosporin use.   . Cabbage Itching  . Keflex [Cephalexin] Hives    And feeling of throat tightness  . Shellfish Allergy Swelling  . Tomato Swelling  . Latex Itching and Rash    Current Outpatient Medications:  .  calcium citrate-vitamin D (CITRACAL+D) 315-200 MG-UNIT tablet, Take 2 tablets by mouth daily., Disp: 180 tablet, Rfl: 2 .  carvedilol (COREG) 12.5 MG tablet, TAKE 1 TABLET BY MOUTH TWICE A DAY, Disp: 180 tablet, Rfl: 3 .  EPINEPHrine 0.3 mg/0.3 mL IJ SOAJ  injection, Inject 0.3 mg into the muscle as needed. Severe allergic reactions, Disp: , Rfl: 0 .  fluticasone (FLONASE) 50 MCG/ACT nasal spray, PLACE 2 SPRAYS DAILY INTO BOTH NOSTRILS., Disp: 48 mL, Rfl: 1 .  hydrocortisone (PROCTOSOL HC) 2.5 % rectal cream, Place 1 application rectally 2 (two) times daily., Disp: 30 g, Rfl: 0 .  losartan-hydrochlorothiazide (HYZAAR) 50-12.5 MG tablet, Take 1 tablet by mouth daily., Disp: 30 tablet, Rfl: 2 .  meclizine (ANTIVERT) 25 MG tablet, TAKE 1 TABLET BY MOUTH THREE TIMES A DAY AS NEEDED, Disp: 90 tablet, Rfl: 1 .  montelukast (SINGULAIR) 10 MG tablet, Take 1 tablet (10 mg total) by mouth at bedtime., Disp: 90 tablet, Rfl: 3 .  pantoprazole (PROTONIX) 40 MG tablet, Take 1 tablet (40 mg total) by mouth daily., Disp: 90 tablet, Rfl: 1 .  PROAIR HFA 108 (90 Base) MCG/ACT inhaler, TAKE 2 PUFFS BY MOUTH EVERY 6 HOURS AS NEEDED FOR WHEEZE OR SHORTNESS OF BREATH (Patient taking differently: Inhale 2 puffs into the lungs every 6 (six) hours as needed for wheezing or shortness of breath. ), Disp: 8.5 Inhaler, Rfl: 2 .  Psyllium (METAMUCIL PO), Take by mouth at bedtime. gummies, Disp: , Rfl:  .  RESTASIS 0.05 % ophthalmic emulsion, INSTILL 1 DROP INTO BOTH EYES TWICE A DAY, Disp: 60 mL, Rfl: 1 .  rosuvastatin (CRESTOR) 20 MG tablet, TAKE 1 TABLET BY MOUTH EVERY DAY, Disp: 90 tablet, Rfl: 3 .  SYMBICORT 160-4.5 MCG/ACT inhaler, TAKE 2 PUFFS  BY MOUTH TWICE A DAY, Disp: 30.6 each, Rfl: 4 .  warfarin (COUMADIN) 4 MG tablet, Take one (1) of your blue, 4mg  strength warfarin tabletst on Sundays, Tuesdays, Thursdays and Saturdays. On Mondays, Wednesdays and Fridays, take only 1/2 tablet., Disp: 74 tablet, Rfl: 3 Past Medical History:  Diagnosis Date  . Arthritis   . Asthma   . Bronchitis   . CHF (congestive heart failure) (HCC)   . Diabetes (HCC) 2019  . Diverticulitis   . DVT, lower extremity, recurrent (HCC)    Patient had unprovoked PE on 2002 and DVT in right lower  extremety 2008.  2009 GERD (gastroesophageal reflux disease)   . Hematuria, microscopic 09/13/2017  . History of kidney stones   . Hypertension   . PE (pulmonary embolism)    Patient had unprovoked PE on 2002  . PONV (postoperative nausea and vomiting)   . Vertigo    Social History   Socioeconomic History  . Marital status: Divorced    Spouse name: Not on file  . Number of children: 5  . Years of education: 9 th grade  . Highest education level: 9th grade  Occupational History  . Occupation: retired  Tobacco Use  . Smoking status: Former Smoker    Types: Cigarettes    Quit date: 09/18/1988    Years since quitting: 31.2  . Smokeless tobacco: Never Used  . Tobacco comment: 6 pack year smoking history as a teen  Vaping Use  . Vaping Use: Never used  Substance and Sexual Activity  . Alcohol use: No  . Drug use: No  . Sexual activity: Not Currently    Birth control/protection: None  Other Topics Concern  . Not on file  Social History Narrative   Current Social History 08/28/2019   Patient lives with 64 yo granddaughter in a one story home. There are not steps up to the entrance the patient uses.       Patient's method of transportation is church member.      The highest level of education was 9th grade      The patient currently retired.      Identified important Relationships are God, family       Pets : 1 lab/pitt mix named Cinnamon       Interests / Fun: Church,TV       Current Stressors: "Me and my granddaughter"       Religious / Personal Beliefs: I'm Holiness and I love people"       Other: "I'd give away my last dime."       L. Ducatte, BSN, RN-BC    Social Determinants of Health   Financial Resource Strain:   . Difficulty of Paying Living Expenses: Not on file  Food Insecurity: Food Insecurity Present  . Worried About 22 in the Last Year: Sometimes true  . Ran Out of Food in the Last Year: Never true  Transportation Needs:   . Lack of  Transportation (Medical): Not on file  . Lack of Transportation (Non-Medical): Not on file  Physical Activity:   . Days of Exercise per Week: Not on file  . Minutes of Exercise per Session: Not on file  Stress:   . Feeling of Stress : Not on file  Social Connections:   . Frequency of Communication with Friends and Family: Not on file  . Frequency of Social Gatherings with Friends and Family: Not on file  . Attends Religious Services: Not on file  .  Active Member of Clubs or Organizations: Not on file  . Attends Banker Meetings: Not on file  . Marital Status: Not on file   Family History  Problem Relation Age of Onset  . Cancer Mother   . Hypertension Sister   . Diabetes Sister   . Hypertension Brother   . Diabetes Brother   . Hypertension Sister   . Diabetes Sister   . Colon cancer Other   . Heart Problems Other 34       sister's child, open heart surgery  . CVA Father   . Diabetes Son   . Hypertension Son   . Kidney disease Son        on dialysis  . Hypertension Sister   . Diabetes Sister   . Hypertension Sister   . Diabetes Sister   . Cancer Brother   . Hypertension Son   . Diabetes Son   . Hypertension Daughter   . Schizophrenia Daughter     ASSESSMENT Recent Results: The most recent result is correlated with 20mg   per week: Lab Results  Component Value Date   INR 2.8 11/25/2019   INR 2.9 10/28/2019   INR 2.0 09/30/2019    Anticoagulation Dosing: Description   Take one (1) of your 4 mg blue warfarin tablets on Sundays,  Thursdays, Saturdays. All other days, take only one-half (1/2) of your 4mg  blue warfarin tablets.      INR today: Therapeutic  PLAN Weekly dose was unchanged.   Patient Instructions  Patient instructed to take medications as defined in the Anti-coagulation Track section of this encounter.  Patient instructed to take today's dose.  Patient instructed to take one (1) of your 4 mg blue warfarin tablets on Sundays,   Thursdays, Saturdays. All other days, take only one-half (1/2) of your 4mg  blue warfarin tablets.  Patient verbalized understanding of these instructions.    Patient advised to contact clinic or seek medical attention if signs/symptoms of bleeding or thromboembolism occur.  Patient verbalized understanding by repeating back information and was advised to contact me if further medication-related questions arise. Patient was also provided an information handout.  Follow-up Return in 4 weeks (on 12/23/2019) for Follow up INR.  04-07-1988, PharmD, CPP  15 minutes spent face-to-face with the patient during the encounter. 50% of time spent on education, including signs/sx bleeding and clotting, as well as food and drug interactions with warfarin. 50% of time was spent on fingerprick POC INR sample collection,processing, results determination, and documentation in .

## 2019-11-25 NOTE — Patient Instructions (Signed)
Caroline [name],  Goals for the next month.   You did good weighing yourself at home over the past month!  Let's try to increase that to at least 1 time a week. It might be best to pick a day and weigh that day each week.   This will help you to think about what might be affecting your weight- like salt or activity or portions and then make a plan to do something about it.   The 2nd goal is to look for sources of salt- canned goods are now available in lower salt version that are the same price, the lemon Pepper and try to use lower salt versions.   It would also be helpful to try to eat a vegetables every day. Greens are fine to eat, carrots are ALWAYS a bargain.   [name] [phone]

## 2019-11-25 NOTE — Progress Notes (Signed)
°  Medical Nutrition Therapy:  Appt start time: 1555 end time:  1615. Total time: 30 Last visit- 10/2019  Assessment:  Primary concerns today: Ms. Cowens weighed herself one time this past  Month at home. She feels she is retaining water and asks what she can do about this. We discussed lower salt options and increasing her vegetable intake.  MEDICINE: her doctor stopped her metformin   SLEEP: "TV now turns itself off"  BLOOD SUGAR: her A1c has increased from 5.5 to 6.2 Weight: Reports she was 273# at home. Wt Readings from Last 10 Encounters:  11/25/19 277 lb 3.2 oz (125.7 kg)  10/29/19 275 lb 4.8 oz (124.9 kg)  10/01/19 275 lb 9.6 oz (125 kg)  10/01/19 275 lb 9.6 oz (125 kg)  03/19/19 257 lb 11.2 oz (116.9 kg)  03/05/19 256 lb (116.1 kg)  02/26/19 255 lb 4 oz (115.8 kg)  01/09/19 263 lb 11.2 oz (119.6 kg)  12/31/18 267 lb 3.2 oz (121.2 kg)  12/19/18 267 lb 3.2 oz (121.2 kg)     DIETARY INTAKE: Usual eating pattern includes 1- 2 meals and reports 1-2 snacks per day   Progress Towards Goal(s):  In progress.   Nutritional Diagnosis:  NB-1.4 Self-monitoring deficit As related to not weighing or knowing that she had gained weight.  As evidenced by her report and increased weight.    Intervention:  Nutrition education about  lower salt meals/snacks, self monitoring.  Action Goal:see patient instructions Outcome goal: improved knowledge about weight loss, lower salt and eatuing healthy with lower vitamin K foods. Coordination of care: none=- consider spekaing with pharmacy about her vegetable intake Teaching Method Utilized: Visual, Auditory,Hands on Handouts given during visit include: After visit summary Barriers to learning/adherence to lifestyle change: competing values Demonstrated degree of understanding via:  Teach Back   Monitoring/Evaluation:  Dietary intake, exercise, food records, and body weight in 4 week(s). [name], RD 11/25/2019 4:43  PM.

## 2019-11-26 NOTE — Assessment & Plan Note (Signed)
Patient's BP today is 136/72 with a goal of <140/80. The patient endorses adherence to her medication regimen.  After starting losartan-HCTZ she reports a few episodes of orthostasis.  This improved after the first week and she is tolerating the medication.  She reports she has also been working on cutting out salt in her diet.  Assessment: Hypertension, patient has a history of vertigo and orthostatic hypotension listed as a problem.  BP today 136/72 . Given age and risk factors no further titration of medication today.  Plan: Continue losartan-HCTZ 50- 12.5 mg daily Continue Coreg 12.5 mg daily (started for HFmrEF)

## 2019-11-26 NOTE — Assessment & Plan Note (Signed)
Patient is overweight and has a difficulty time ambulating.  She uses a Rollator and a cane.  She reports difficulty getting down and up off of her commode.  Plan: Elevated commode seat

## 2019-11-29 ENCOUNTER — Telehealth: Payer: Self-pay | Admitting: Dietician

## 2019-11-29 NOTE — Telephone Encounter (Signed)
Discussed Dec 13th appointment. Patient agreed to meet on that day.

## 2019-12-03 NOTE — Progress Notes (Signed)
Internal Medicine Clinic Attending  Case discussed with Dr. Steen  At the time of the visit.  We reviewed the resident's history and exam and pertinent patient test results.  I agree with the assessment, diagnosis, and plan of care documented in the resident's note.  

## 2019-12-18 ENCOUNTER — Other Ambulatory Visit: Payer: Self-pay | Admitting: Internal Medicine

## 2019-12-18 DIAGNOSIS — Z6841 Body Mass Index (BMI) 40.0 and over, adult: Secondary | ICD-10-CM

## 2019-12-22 ENCOUNTER — Other Ambulatory Visit: Payer: Self-pay | Admitting: Internal Medicine

## 2019-12-22 DIAGNOSIS — I10 Essential (primary) hypertension: Secondary | ICD-10-CM

## 2019-12-23 ENCOUNTER — Other Ambulatory Visit: Payer: Self-pay | Admitting: Internal Medicine

## 2019-12-23 ENCOUNTER — Ambulatory Visit (INDEPENDENT_AMBULATORY_CARE_PROVIDER_SITE_OTHER): Payer: Medicare Other | Admitting: Pharmacist

## 2019-12-23 ENCOUNTER — Ambulatory Visit (INDEPENDENT_AMBULATORY_CARE_PROVIDER_SITE_OTHER): Payer: Medicare Other | Admitting: Dietician

## 2019-12-23 ENCOUNTER — Encounter: Payer: Self-pay | Admitting: Dietician

## 2019-12-23 DIAGNOSIS — E118 Type 2 diabetes mellitus with unspecified complications: Secondary | ICD-10-CM | POA: Diagnosis not present

## 2019-12-23 DIAGNOSIS — I82402 Acute embolism and thrombosis of unspecified deep veins of left lower extremity: Secondary | ICD-10-CM | POA: Diagnosis not present

## 2019-12-23 DIAGNOSIS — Z7901 Long term (current) use of anticoagulants: Secondary | ICD-10-CM | POA: Diagnosis not present

## 2019-12-23 DIAGNOSIS — R42 Dizziness and giddiness: Secondary | ICD-10-CM

## 2019-12-23 LAB — POCT INR: INR: 3 (ref 2.0–3.0)

## 2019-12-23 NOTE — Progress Notes (Signed)
Anticoagulation Management Caroline [name] is a 74 y.o. female who reports to the clinic for monitoring of warfarin treatment.    Indication: DVT , History of (resolved) LLE DVT; Long term current use of anticoagulant, warfarin to maintain INR 2.0 -3.0 Duration: indefinite Supervising physician: Debe Coder  Anticoagulation Clinic Visit History: Patient does not report signs/symptoms of bleeding or thromboembolism  Other recent changes: No diet, medications, lifestyle changes endorsed.  Anticoagulation Episode Summary    Current INR goal:  2.0-3.0  TTR:  83.7 % (4.8 y)  Next INR check:  01/13/2020  INR from last check:  3.0 (12/23/2019)  Weekly max warfarin dose:    Target end date:    INR check location:  Anticoagulation Clinic  Preferred lab:    Send INR reminders to:  [facility]   Indications   DVT lower extremity recurrent (HCC) [I82.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 Penicillins Anaphylaxis, Hives, Swelling and Rash    Has patient had a PCN reaction causing immediate rash, facial/tongue/throat swelling, SOB or lightheadedness with hypotension: Yes Has patient had a PCN reaction causing severe rash involving mucus membranes or skin necrosis: Yes Has patient had a PCN reaction that required hospitalization Yes Has patient had a PCN reaction occurring within the last 10 years: No If all of the above answers are "NO", then may proceed with Cephalosporin use.   . Cabbage Itching  . Keflex [Cephalexin] Hives    And feeling of throat tightness  . Shellfish Allergy Swelling  . Tomato Swelling  . Latex Itching and Rash    Current Outpatient Medications:  .  calcium citrate-vitamin D (CITRACAL+D) 315-200 MG-UNIT tablet, TAKE 2 TABLETS BY MOUTH EVERY DAY, Disp: 180 tablet, Rfl: 2 .  carvedilol (COREG) 12.5 MG tablet, TAKE 1 TABLET BY MOUTH TWICE A DAY, Disp: 180 tablet, Rfl: 3 .  EPINEPHrine 0.3  mg/0.3 mL IJ SOAJ injection, Inject 0.3 mg into the muscle as needed. Severe allergic reactions, Disp: , Rfl: 0 .  fluticasone (FLONASE) 50 MCG/ACT nasal spray, PLACE 2 SPRAYS DAILY INTO BOTH NOSTRILS., Disp: 48 mL, Rfl: 1 .  hydrocortisone (PROCTOSOL HC) 2.5 % rectal cream, Place 1 application rectally 2 (two) times daily., Disp: 30 g, Rfl: 0 .  losartan-hydrochlorothiazide (HYZAAR) 50-12.5 MG tablet, Take 1 tablet by mouth daily., Disp: 30 tablet, Rfl: 2 .  meclizine (ANTIVERT) 25 MG tablet, TAKE 1 TABLET BY MOUTH THREE TIMES A DAY AS NEEDED, Disp: 90 tablet, Rfl: 1 .  montelukast (SINGULAIR) 10 MG tablet, Take 1 tablet (10 mg total) by mouth at bedtime., Disp: 90 tablet, Rfl: 3 .  pantoprazole (PROTONIX) 40 MG tablet, Take 1 tablet (40 mg total) by mouth daily., Disp: 90 tablet, Rfl: 1 .  PROAIR HFA 108 (90 Base) MCG/ACT inhaler, TAKE 2 PUFFS BY MOUTH EVERY 6 HOURS AS NEEDED FOR WHEEZE OR SHORTNESS OF BREATH (Patient taking differently: Inhale 2 puffs into the lungs every 6 (six) hours as needed for wheezing or shortness of breath.), Disp: 8.5 Inhaler, Rfl: 2 .  Psyllium (METAMUCIL PO), Take by mouth at bedtime. gummies, Disp: , Rfl:  .  RESTASIS 0.05 % ophthalmic emulsion, INSTILL 1 DROP INTO BOTH EYES TWICE A DAY, Disp: 60 mL, Rfl: 1 .  rosuvastatin (CRESTOR) 20 MG tablet, TAKE 1 TABLET BY MOUTH EVERY DAY, Disp: 90 tablet, Rfl: 3 .  SYMBICORT 160-4.5 MCG/ACT  inhaler, TAKE 2 PUFFS BY MOUTH TWICE A DAY, Disp: 30.6 each, Rfl: 4 .  warfarin (COUMADIN) 4 MG tablet, Take one (1) of your blue, 4mg  strength warfarin tabletst on Sundays, Tuesdays, Thursdays and Saturdays. On Mondays, Wednesdays and Fridays, take only 1/2 tablet., Disp: 74 tablet, Rfl: 3 Past Medical History:  Diagnosis Date  . Arthritis   . Asthma   . Bronchitis   . CHF (congestive heart failure) (HCC)   . Diabetes (HCC) 2019  . Diverticulitis   . DVT, lower extremity, recurrent (HCC)    Patient had unprovoked PE on 2002 and DVT in  right lower extremety 2008.  2009 GERD (gastroesophageal reflux disease)   . Hematuria, microscopic 09/13/2017  . History of kidney stones   . Hypertension   . PE (pulmonary embolism)    Patient had unprovoked PE on 2002  . PONV (postoperative nausea and vomiting)   . Vertigo    Social History   Socioeconomic History  . Marital status: Divorced    Spouse name: Not on file  . Number of children: 5  . Years of education: 9 th grade  . Highest education level: 9th grade  Occupational History  . Occupation: retired  Tobacco Use  . Smoking status: Former Smoker    Types: Cigarettes    Quit date: 09/18/1988    Years since quitting: 31.2  . Smokeless tobacco: Never Used  . Tobacco comment: 6 pack year smoking history as a teen  Vaping Use  . Vaping Use: Never used  Substance and Sexual Activity  . Alcohol use: No  . Drug use: No  . Sexual activity: Not Currently    Birth control/protection: None  Other Topics Concern  . Not on file  Social History Narrative   Current Social History 08/28/2019   Patient lives with 22 yo granddaughter in a one story home. There are not steps up to the entrance the patient uses.       Patient's method of transportation is church member.      The highest level of education was 9th grade      The patient currently retired.      Identified important Relationships are God, family       Pets : 1 lab/pitt mix named Cinnamon       Interests / Fun: Church,TV       Current Stressors: "Me and my granddaughter"       Religious / Personal Beliefs: I'm Holiness and I love people"       Other: "I'd give away my last dime."       L. Ducatte, BSN, RN-BC    Social Determinants of Health   Financial Resource Strain: Not on file  Food Insecurity: Food Insecurity Present  . Worried About 22 in the Last Year: Sometimes true  . Ran Out of Food in the Last Year: Never true  Transportation Needs: Not on file  Physical Activity: Not on file   Stress: Not on file  Social Connections: Not on file   Family History  Problem Relation Age of Onset  . Cancer Mother   . Hypertension Sister   . Diabetes Sister   . Hypertension Brother   . Diabetes Brother   . Hypertension Sister   . Diabetes Sister   . Colon cancer Other   . Heart Problems Other 34       sister's child, open heart surgery  . CVA Father   . Diabetes Son   .  Hypertension Son   . Kidney disease Son        on dialysis  . Hypertension Sister   . Diabetes Sister   . Hypertension Sister   . Diabetes Sister   . Cancer Brother   . Hypertension Son   . Diabetes Son   . Hypertension Daughter   . Schizophrenia Daughter     ASSESSMENT Recent Results: The most recent result is correlated with 20 mg per week: Lab Results  Component Value Date   INR 3.0 12/23/2019   INR 2.8 11/25/2019   INR 2.9 10/28/2019    Anticoagulation Dosing: Description   Take one (1) of your 4 mg blue warfarin tablets on Thursdays, Saturdays. All other days, take only one-half (1/2) of your 4mg  blue warfarin tablets.      INR today: Therapeutic  PLAN Weekly dose was decreased by 10% to 18 mg per week  Patient Instructions  Patient instructed to take medications as defined in the Anti-coagulation Track section of this encounter.  Patient instructed to take today's dose.  Patient instructed to take one (1) of your 4 mg blue warfarin tablets on Thursdays, Saturdays. All other days, take only one-half (1/2) of your 4mg  blue warfarin tablets.  Patient verbalized understanding of these instructions.    Patient advised to contact clinic or seek medical attention if signs/symptoms of bleeding or thromboembolism occur.  Patient verbalized understanding by repeating back information and was advised to contact me if further medication-related questions arise. Patient was also provided an information handout.  Follow-up Return in 3 weeks (on 01/13/2020) for Follow up INR.  , PharmD, CPP  15 minutes spent face-to-face with the patient during the encounter. 50% of time spent on education, including signs/sx bleeding and clotting, as well as food and drug interactions with warfarin. 50% of time was spent on fingerprick POC INR sample collection,processing, results determination, and documentation in 03/12/2020.

## 2019-12-23 NOTE — Patient Instructions (Addendum)
Try to decrease my weight by doing the following:  Eating more vegetables- use the canned veggies I have and rinsing then- a can a day would be great  Sugar free ginger ale and 7-up or store brand lemon lime  Eating smaller portions- such ice cream, chocolate milk    Thank you for your visit!  Happy New year- we can make an appointment on same day you see Vonna Kotyk!

## 2019-12-23 NOTE — Progress Notes (Signed)
  Medical Nutrition Therapy:  Appt start time: 1035 end time:  1115 am. Total time:40 minutes Last visit- 11/25/2019  Assessment:  Primary concerns today: Ms. Connett weighed herself several times this past month. She reports cooking a lot and gaining weight over Thanksgiving was expected. She plans to lower her intake to just fruits and cereal for a few days to take it back off.  We continued our discussion about increasing her vegetable intake and decreasing her intake of higher fat and higher sugar foods like chocolate milk, juice and ice cream  Weight: Reports she was 277.6#  At home which is consistent with her last report of being lower than her weights here in the office.  Wt Readings from Last 10 Encounters:  12/23/19 280 lb 1.6 oz (127.1 kg)  11/25/19 277 lb 3.2 oz (125.7 kg)  10/29/19 275 lb 4.8 oz (124.9 kg)  10/01/19 275 lb 9.6 oz (125 kg)  10/01/19 275 lb 9.6 oz (125 kg)  03/19/19 257 lb 11.2 oz (116.9 kg)  03/05/19 256 lb (116.1 kg)  02/26/19 255 lb 4 oz (115.8 kg)  01/09/19 263 lb 11.2 oz (119.6 kg)  12/31/18 267 lb 3.2 oz (121.2 kg)    DIETARY INTAKE: Usual eating pattern includes 1- 2 meals and reports 1-2 snacks per day Greens, ham, [place], dressing, potato salad, regular soda, ice cream, cereal, chocolate milk, juice,canned greens with [place] legs, applesauce, peanut butter, bananas  Progress Towards Goal(s):  In progress.   Nutritional Diagnosis:  NB-1.4 Self-monitoring deficit As related to not weighing or knowing that she had gained weight is improving  As evidenced by her report of weighing and knowing she has increased weight.    Intervention:  Nutrition education about lower calorie foods and decreased portion sizes for those higher fat and sugar foods she does not want to stop eating, self monitoring.  Action Goal:see patient instructions Outcome goal: improved knowledge about weight loss, lower calorie and higher nutrient dense food intake. Coordination of  care: none  Teaching Method Utilized: Visual, Auditory,Hands on Handouts given during visit include: After visit summary Barriers to learning/adherence to lifestyle change: competing values Demonstrated degree of understanding via:  Teach Back   Monitoring/Evaluation:  Dietary intake, exercise, food records, and body weight in 4 week(s). [name], RD 12/23/2019 10:40 AM.

## 2019-12-23 NOTE — Patient Instructions (Signed)
Patient instructed to take medications as defined in the Anti-coagulation Track section of this encounter.  Patient instructed to take today's dose.  Patient instructed to take one (1) of your 4 mg blue warfarin tablets on Thursdays, Saturdays. All other days, take only one-half (1/2) of your 4mg blue warfarin tablets.  Patient verbalized understanding of these instructions.    

## 2019-12-24 ENCOUNTER — Other Ambulatory Visit: Payer: Self-pay | Admitting: Internal Medicine

## 2019-12-25 ENCOUNTER — Other Ambulatory Visit: Payer: Self-pay | Admitting: Student in an Organized Health Care Education/Training Program

## 2019-12-25 DIAGNOSIS — I5042 Chronic combined systolic (congestive) and diastolic (congestive) heart failure: Secondary | ICD-10-CM

## 2019-12-25 NOTE — Progress Notes (Signed)
I reviewed Dr. Saralyn Pilar note.  Patient is on coumadin for DVT and PE.  INR was 3.0 and dose decreased.

## 2019-12-26 NOTE — Telephone Encounter (Signed)
Spoke to pt, she states she will call and make appt with her eye doctor

## 2020-01-01 ENCOUNTER — Other Ambulatory Visit: Payer: Self-pay | Admitting: Internal Medicine

## 2020-01-01 DIAGNOSIS — J301 Allergic rhinitis due to pollen: Secondary | ICD-10-CM

## 2020-01-10 ENCOUNTER — Other Ambulatory Visit: Payer: Self-pay | Admitting: Student

## 2020-01-10 DIAGNOSIS — I10 Essential (primary) hypertension: Secondary | ICD-10-CM

## 2020-01-13 ENCOUNTER — Ambulatory Visit: Payer: Medicare Other

## 2020-01-20 ENCOUNTER — Other Ambulatory Visit: Payer: Self-pay

## 2020-01-20 ENCOUNTER — Ambulatory Visit (INDEPENDENT_AMBULATORY_CARE_PROVIDER_SITE_OTHER): Payer: Medicare Other | Admitting: Pharmacist

## 2020-01-20 DIAGNOSIS — I82409 Acute embolism and thrombosis of unspecified deep veins of unspecified lower extremity: Secondary | ICD-10-CM

## 2020-01-20 LAB — POCT INR: INR: 2.5 (ref 2.0–3.0)

## 2020-01-20 NOTE — Progress Notes (Signed)
INTERNAL MEDICINE TEACHING ATTENDING ADDENDUM  I agree with pharmacy recommendations as outlined in their note.   Xavien Dauphinais N Kaylise Blakeley, MD  

## 2020-01-20 NOTE — Patient Instructions (Signed)
Patient instructed to take medications as defined in the Anti-coagulation Track section of this encounter.  Patient instructed to take today's dose.  Patient instructed to take one (1) of your 4 mg blue warfarin tablets on Thursdays, Saturdays. All other days, take only one-half (1/2) of your 4mg  blue warfarin tablets.  Patient verbalized understanding of these instructions.

## 2020-01-20 NOTE — Progress Notes (Signed)
Anticoagulation Management Caroline [name] is a 75 y.o. female who reports to the clinic for monitoring of warfarin treatment.    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 Penicillins Anaphylaxis, Hives, Swelling and Rash    Has patient had a PCN reaction causing immediate rash, facial/tongue/throat swelling, SOB or lightheadedness with hypotension: Yes Has patient had a PCN reaction causing severe rash involving mucus membranes or skin necrosis: Yes Has patient had a PCN reaction that required hospitalization Yes Has patient had a PCN reaction occurring within the last 10 years: No If all of the above answers are "NO", then may proceed with Cephalosporin use.   . Cabbage Itching  . Keflex [Cephalexin] Hives    And feeling of throat tightness  . Shellfish Allergy Swelling  . Tomato Swelling  . Latex Itching and Rash    Current Outpatient Medications:  .  calcium citrate-vitamin D (CITRACAL+D) 315-200 MG-UNIT tablet, TAKE 2 TABLETS BY MOUTH EVERY DAY, Disp: 180 tablet, Rfl: 2 .  carvedilol (COREG) 12.5 MG tablet, TAKE 1 TABLET BY MOUTH TWICE A DAY, Disp: 180 tablet, Rfl: 3 .  cycloSPORINE (RESTASIS) 0.05 % ophthalmic emulsion, INSTILL 1 DROP INTO BOTH EYES TWICE A DAY, Disp: 60 mL, Rfl: 1 .   EPINEPHrine 0.3 mg/0.3 mL IJ SOAJ injection, Inject 0.3 mg into the muscle as needed. Severe allergic reactions, Disp: , Rfl: 0 .  fluticasone (FLONASE) 50 MCG/ACT nasal spray, PLACE 2 SPRAYS DAILY INTO BOTH NOSTRILS., Disp: 48 mL, Rfl: 1 .  hydrocortisone (PROCTOSOL HC) 2.5 % rectal cream, Place 1 application rectally 2 (two) times daily., Disp: 30 g, Rfl: 0 .  losartan-hydrochlorothiazide (HYZAAR) 50-12.5 MG tablet, TAKE 1 TABLET BY MOUTH EVERY DAY, Disp: 90 tablet, Rfl: 3 .  meclizine (ANTIVERT) 25 MG tablet, TAKE 1 TABLET BY MOUTH THREE TIMES A DAY AS NEEDED, Disp: 90 tablet, Rfl: 5 .  montelukast (SINGULAIR) 10 MG tablet, Take 1 tablet (10 mg total) by mouth at bedtime., Disp: 90 tablet, Rfl: 3 .  pantoprazole (PROTONIX) 40 MG tablet, Take 1 tablet (40 mg total) by mouth daily., Disp: 90 tablet, Rfl: 1 .  PROAIR HFA 108 (90 Base) MCG/ACT inhaler, TAKE 2 PUFFS BY MOUTH EVERY 6 HOURS AS NEEDED FOR WHEEZE OR SHORTNESS OF BREATH (Patient taking differently: Inhale 2 puffs into the lungs every 6 (six) hours as needed for wheezing or shortness of breath.), Disp: 8.5 Inhaler, Rfl: 2 .  Psyllium (METAMUCIL PO), Take by mouth at bedtime. gummies, Disp: , Rfl:  .  rosuvastatin (CRESTOR) 20 MG tablet, TAKE 1 TABLET BY MOUTH EVERY DAY, Disp: 90 tablet, Rfl: 3 .  SYMBICORT 160-4.5 MCG/ACT inhaler, TAKE 2 PUFFS BY MOUTH TWICE A DAY, Disp: 30.6 each, Rfl: 4 .  warfarin (COUMADIN) 4 MG tablet, Take one (1) of your blue, 4mg  strength warfarin tabletst on Sundays, Tuesdays, Thursdays and Saturdays. On Mondays, Wednesdays and Fridays, take only 1/2 tablet., Disp: 74 tablet, Rfl: 3 Past Medical History:  Diagnosis Date  . Arthritis   . Asthma   . Bronchitis   . CHF (congestive heart failure) (HCC)   . Diabetes (HCC) 2019  . Diverticulitis   . DVT, lower extremity, recurrent (HCC)    Patient had unprovoked PE on 2002 and DVT in right lower extremety 2008.  2009 GERD (gastroesophageal reflux disease)   .  Hematuria, microscopic 09/13/2017  . History of kidney stones   . Hypertension   . PE (pulmonary embolism)    Patient had unprovoked PE on 2002  . PONV (postoperative nausea and vomiting)   . Vertigo    Social History   Socioeconomic History  . Marital status: Divorced    Spouse name: Not on file  . Number of children: 5  . Years of education: 9 th grade  . Highest education level: 9th grade  Occupational History  . Occupation: retired  Tobacco Use  . Smoking status: Former Smoker    Types: Cigarettes    Quit date: 09/18/1988    Years since quitting: 31.3  . Smokeless tobacco: Never Used  . Tobacco comment: 6 pack year smoking history as a teen  Vaping Use  . Vaping Use: Never used  Substance and Sexual Activity  . Alcohol use: No  . Drug use: No  . Sexual activity: Not Currently    Birth control/protection: None  Other Topics Concern  . Not on file  Social History Narrative   Current Social History 08/28/2019   Patient lives with 70 yo granddaughter in a one story home. There are not steps up to the entrance the patient uses.       Patient's method of transportation is church member.      The highest level of education was 9th grade      The patient currently retired.      Identified important Relationships are God, family       Pets : 1 lab/pitt mix named Cinnamon       Interests / Fun: Church,TV       Current Stressors: "Me and my granddaughter"       Religious / Personal Beliefs: I'm Holiness and I love people"       Other: "I'd give away my last dime."       L. Ducatte, BSN, RN-BC    Social Determinants of Health   Financial Resource Strain: Not on file  Food Insecurity: Food Insecurity Present  . Worried About 22 in the Last Year: Sometimes true  . Ran Out of Food in the Last Year: Never true  Transportation Needs: Not on file  Physical Activity: Not on file  Stress: Not on file  Social Connections: Not on file   Family History   Problem Relation Age of Onset  . Cancer Mother   . Hypertension Sister   . Diabetes Sister   . Hypertension Brother   . Diabetes Brother   . Hypertension Sister   . Diabetes Sister   . Colon cancer Other   . Heart Problems Other 34       sister's child, open heart surgery  . CVA Father   . Diabetes Son   . Hypertension Son   . Kidney disease Son  on dialysis  . Hypertension Sister   . Diabetes Sister   . Hypertension Sister   . Diabetes Sister   . Cancer Brother   . Hypertension Son   . Diabetes Son   . Hypertension Daughter   . Schizophrenia Daughter     ASSESSMENT Recent Results: The most recent result is correlated with 18 mg per week: Lab Results  Component Value Date   INR 2.5 01/20/2020   INR 3.0 12/23/2019   INR 2.8 11/25/2019    Anticoagulation Dosing: Description   Take one (1) of your 4 mg blue warfarin tablets on Thursdays, Saturdays. All other days, take only one-half (1/2) of your 4mg  blue warfarin tablets.      INR today: Therapeutic  PLAN Weekly dose was unchanged  Patient Instructions  Patient instructed to take medications as defined in the Anti-coagulation Track section of this encounter.  Patient instructed to take today's dose.  Patient instructed to take one (1) of your 4 mg blue warfarin tablets on Thursdays, Saturdays. All other days, take only one-half (1/2) of your 4mg  blue warfarin tablets.  Patient verbalized understanding of these instructions.     Patient advised to contact clinic or seek medical attention if signs/symptoms of bleeding or thromboembolism occur.  Patient verbalized understanding by repeating back information and was advised to contact me if further medication-related questions arise. Patient was also provided an information handout.  Follow-up Return in about 3 weeks (around 02/10/2020) for Follow up INR at 10:30AM.   15 minutes spent face-to-face with the patient during the  encounter. 50% of time spent on education, including signs/sx bleeding and clotting, as well as food and drug interactions with warfarin. 50% of time was spent on fingerprick POC INR sample collection,processing, results determination, and documentation in 02/12/2020.

## 2020-02-10 ENCOUNTER — Other Ambulatory Visit: Payer: Self-pay

## 2020-02-10 ENCOUNTER — Ambulatory Visit (INDEPENDENT_AMBULATORY_CARE_PROVIDER_SITE_OTHER): Payer: Medicare Other | Admitting: Pharmacist

## 2020-02-10 DIAGNOSIS — I82409 Acute embolism and thrombosis of unspecified deep veins of unspecified lower extremity: Secondary | ICD-10-CM

## 2020-02-10 LAB — POCT INR: INR: 4.3 — AB (ref 2.0–3.0)

## 2020-02-10 NOTE — Progress Notes (Signed)
Anticoagulation Management Caroline [name] is a 75 y.o. female who reports to the clinic for monitoring of warfarin treatment.    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 Penicillins Anaphylaxis, Hives, Swelling and Rash    Has patient had a PCN reaction causing immediate rash, facial/tongue/throat swelling, SOB or lightheadedness with hypotension: Yes Has patient had a PCN reaction causing severe rash involving mucus membranes or skin necrosis: Yes Has patient had a PCN reaction that required hospitalization Yes Has patient had a PCN reaction occurring within the last 10 years: No If all of the above answers are "NO", then may proceed with Cephalosporin use.   . Cabbage Itching  . Keflex [Cephalexin] Hives    And feeling of throat tightness  . Shellfish Allergy Swelling  . Tomato Swelling  . Latex Itching and Rash    Current Outpatient Medications:  .  calcium citrate-vitamin D (CITRACAL+D) 315-200 MG-UNIT tablet, TAKE 2 TABLETS BY MOUTH EVERY DAY, Disp: 180 tablet, Rfl: 2 .  carvedilol (COREG) 12.5 MG tablet, TAKE 1 TABLET BY MOUTH TWICE A DAY, Disp: 180 tablet, Rfl: 3 .  cycloSPORINE (RESTASIS) 0.05 % ophthalmic emulsion, INSTILL 1 DROP INTO BOTH EYES TWICE A DAY, Disp: 60 mL, Rfl: 1 .   EPINEPHrine 0.3 mg/0.3 mL IJ SOAJ injection, Inject 0.3 mg into the muscle as needed. Severe allergic reactions, Disp: , Rfl: 0 .  fluticasone (FLONASE) 50 MCG/ACT nasal spray, PLACE 2 SPRAYS DAILY INTO BOTH NOSTRILS., Disp: 48 mL, Rfl: 1 .  hydrocortisone (PROCTOSOL HC) 2.5 % rectal cream, Place 1 application rectally 2 (two) times daily., Disp: 30 g, Rfl: 0 .  losartan-hydrochlorothiazide (HYZAAR) 50-12.5 MG tablet, TAKE 1 TABLET BY MOUTH EVERY DAY, Disp: 90 tablet, Rfl: 3 .  meclizine (ANTIVERT) 25 MG tablet, TAKE 1 TABLET BY MOUTH THREE TIMES A DAY AS NEEDED, Disp: 90 tablet, Rfl: 5 .  montelukast (SINGULAIR) 10 MG tablet, Take 1 tablet (10 mg total) by mouth at bedtime., Disp: 90 tablet, Rfl: 3 .  pantoprazole (PROTONIX) 40 MG tablet, Take 1 tablet (40 mg total) by mouth daily., Disp: 90 tablet, Rfl: 1 .  PROAIR HFA 108 (90 Base) MCG/ACT inhaler, TAKE 2 PUFFS BY MOUTH EVERY 6 HOURS AS NEEDED FOR WHEEZE OR SHORTNESS OF BREATH (Patient taking differently: Inhale 2 puffs into the lungs every 6 (six) hours as needed for wheezing or shortness of breath.), Disp: 8.5 Inhaler, Rfl: 2 .  Psyllium (METAMUCIL PO), Take by mouth at bedtime. gummies, Disp: , Rfl:  .  rosuvastatin (CRESTOR) 20 MG tablet, TAKE 1 TABLET BY MOUTH EVERY DAY, Disp: 90 tablet, Rfl: 3 .  SYMBICORT 160-4.5 MCG/ACT inhaler, TAKE 2 PUFFS BY MOUTH TWICE A DAY, Disp: 30.6 each, Rfl: 4 .  warfarin (COUMADIN) 4 MG tablet, Take one (1) of your blue, 4mg  strength warfarin tabletst on Sundays, Tuesdays, Thursdays and Saturdays. On Mondays, Wednesdays and Fridays, take only 1/2 tablet., Disp: 74 tablet, Rfl: 3 Past Medical History:  Diagnosis Date  . Arthritis   . Asthma   . Bronchitis   . CHF (congestive heart failure) (HCC)   . Diabetes (HCC) 2019  . Diverticulitis   . DVT, lower extremity, recurrent (HCC)    Patient had unprovoked PE on 2002 and DVT in right lower extremety 2008.  2009 GERD (gastroesophageal reflux disease)   .  Hematuria, microscopic 09/13/2017  . History of kidney stones   . Hypertension   . PE (pulmonary embolism)    Patient had unprovoked PE on 2002  . PONV (postoperative nausea and vomiting)   . Vertigo    Social History   Socioeconomic History  . Marital status: Divorced    Spouse name: Not on file  . Number of children: 5  . Years of education: 9 th grade  . Highest education level: 9th grade  Occupational History  . Occupation: retired  Tobacco Use  . Smoking status: Former Smoker    Types: Cigarettes    Quit date: 09/18/1988    Years since quitting: 31.4  . Smokeless tobacco: Never Used  . Tobacco comment: 6 pack year smoking history as a teen  Vaping Use  . Vaping Use: Never used  Substance and Sexual Activity  . Alcohol use: No  . Drug use: No  . Sexual activity: Not Currently    Birth control/protection: None  Other Topics Concern  . Not on file  Social History Narrative   Current Social History 08/28/2019   Patient lives with 63 yo granddaughter in a one story home. There are not steps up to the entrance the patient uses.       Patient's method of transportation is church member.      The highest level of education was 9th grade      The patient currently retired.      Identified important Relationships are God, family       Pets : 1 lab/pitt mix named Cinnamon       Interests / Fun: Church,TV       Current Stressors: "Me and my granddaughter"       Religious / Personal Beliefs: I'm Holiness and I love people"       Other: "I'd give away my last dime."       L. Ducatte, BSN, RN-BC    Social Determinants of Health   Financial Resource Strain: Not on file  Food Insecurity: Food Insecurity Present  . Worried About 22 in the Last Year: Sometimes true  . Ran Out of Food in the Last Year: Never true  Transportation Needs: Not on file  Physical Activity: Not on file  Stress: Not on file  Social Connections: Not on file   Family History   Problem Relation Age of Onset  . Cancer Mother   . Hypertension Sister   . Diabetes Sister   . Hypertension Brother   . Diabetes Brother   . Hypertension Sister   . Diabetes Sister   . Colon cancer Other   . Heart Problems Other 34       sister's child, open heart surgery  . CVA Father   . Diabetes Son   . Hypertension Son   . Kidney disease Son  on dialysis  . Hypertension Sister   . Diabetes Sister   . Hypertension Sister   . Diabetes Sister   . Cancer Brother   . Hypertension Son   . Diabetes Son   . Hypertension Daughter   . Schizophrenia Daughter     ASSESSMENT Recent Results: The most recent result is correlated with 20 mg per week: Lab Results  Component Value Date   INR 4.3 (A) 02/10/2020   INR 2.5 01/20/2020   INR 3.0 12/23/2019    Anticoagulation Dosing: Description   Omit warfarin dose on Monday 02/10/2020 only. Take one (1) of your 4 mg blue warfarin tablets on Saturdays and Sundays. All other days, take only one-half (1/2) of your 4mg  blue warfarin tablets.     INR today: Supratherapeutic  PLAN Weekly dose was decreased by 10% to 18 mg per week.  Patient was instructed to omit today's warfarin dose on 02/10/20 only (leading to 7-day warfarin total of 16mg  for the week of 1/30-2/5.)   Patient Instructions  Patient instructed to take medications as defined in the Anti-coagulation Track section of this encounter.  Patient instructed to OMIT today's dose on Monday 02/10/20 only. Patient instructed to take one (1) of your 4 mg blue warfarin tablets on Saturdays and Sundays. All other days, take only one-half (1/2) of your 4mg  blue warfarin tablets. Patient verbalized understanding of these instructions.    Patient advised to contact clinic or seek medical attention if signs/symptoms of bleeding or thromboembolism occur.  Patient verbalized understanding by repeating back information and was advised to contact me if further medication-related  questions arise. Patient was also provided an information handout.  Follow-up Return in about 3 weeks (around 03/02/2020) for follow up INR at 10:30 AM.  04-24-1971, PharmD PGY1 Pharmacy Resident 02/10/2020 10:43 AM   15 minutes spent face-to-face with the patient during the encounter. 50% of time spent on education, including signs/sx bleeding and clotting, as well as food and drug interactions with warfarin. 50% of time was spent on fingerprick POC INR sample collection,processing, results determination, and documentation in 03/04/2020.

## 2020-02-10 NOTE — Patient Instructions (Signed)
Patient instructed to take medications as defined in the Anti-coagulation Track section of this encounter.  Patient instructed to OMIT today's dose on Monday 02/10/20 only. Patient instructed to take one (1) of your 4 mg blue warfarin tablets on Saturdays and Sundays. All other days, take only one-half (1/2) of your 4mg  blue warfarin tablets. Patient verbalized understanding of these instructions.

## 2020-02-12 ENCOUNTER — Other Ambulatory Visit: Payer: Self-pay | Admitting: Internal Medicine

## 2020-02-12 DIAGNOSIS — E7800 Pure hypercholesterolemia, unspecified: Secondary | ICD-10-CM

## 2020-02-18 ENCOUNTER — Other Ambulatory Visit: Payer: Self-pay | Admitting: Internal Medicine

## 2020-02-27 IMAGING — MG DIGITAL SCREENING BILAT W/ TOMO W/ CAD
6 of 12 series · 6 of 36 positions shown · non-contrast
Comparison: Previous exam(s).

ACR Breast Density Category a: The breast tissue is almost entirely
fatty.

CLINICAL DATA: Screening.

EXAM:
DIGITAL SCREENING BILATERAL MAMMOGRAM WITH TOMO AND CAD

[L MLO synth-2D]
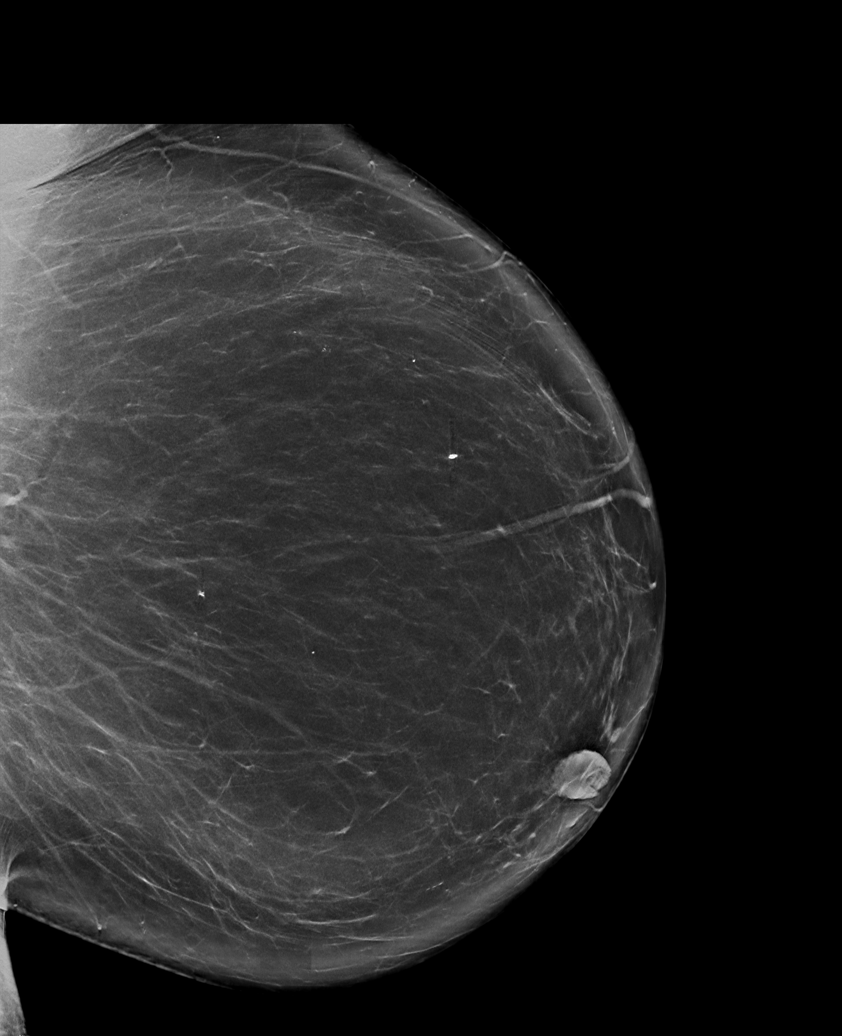

[L CC synth-2D (1 of 2)]
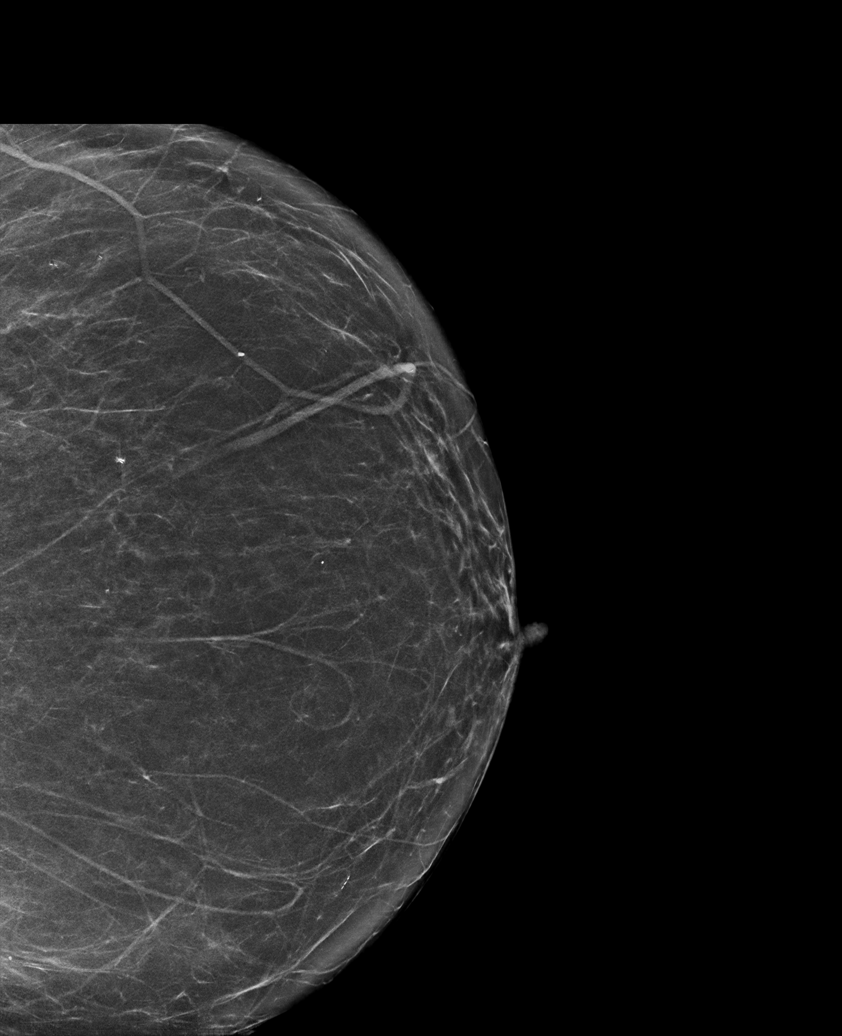

[R MLO synth-2D]
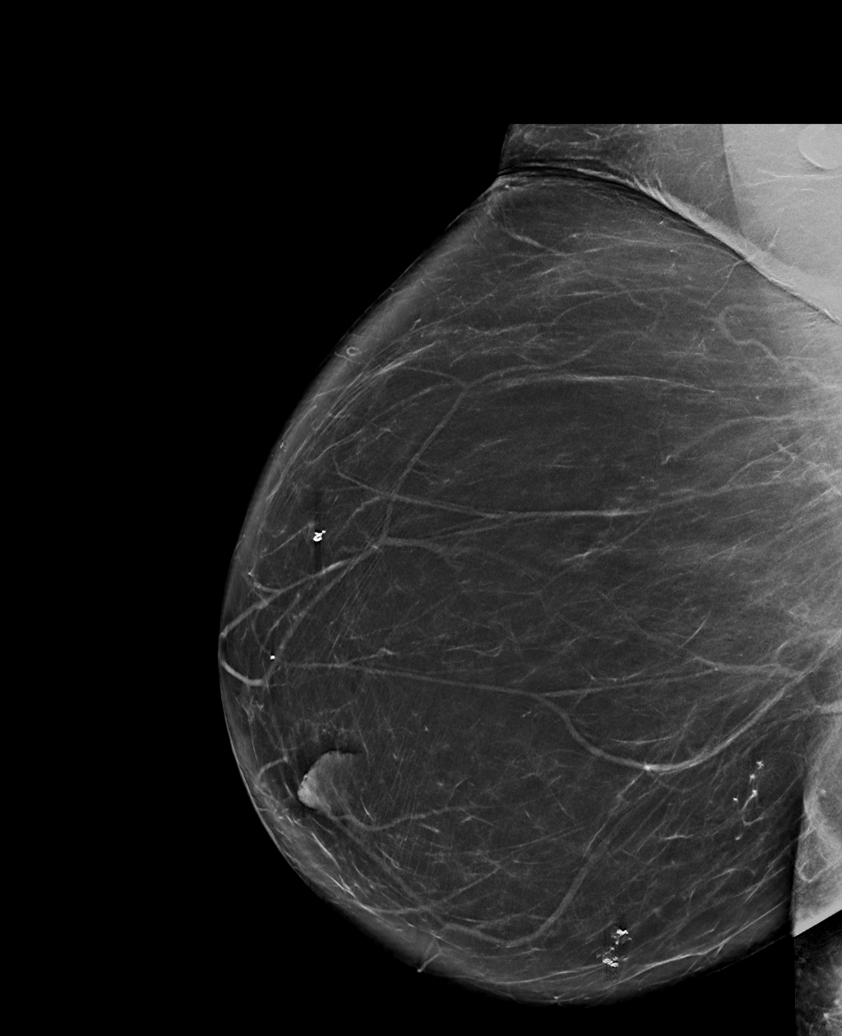

[R CC synth-2D (1 of 2)]
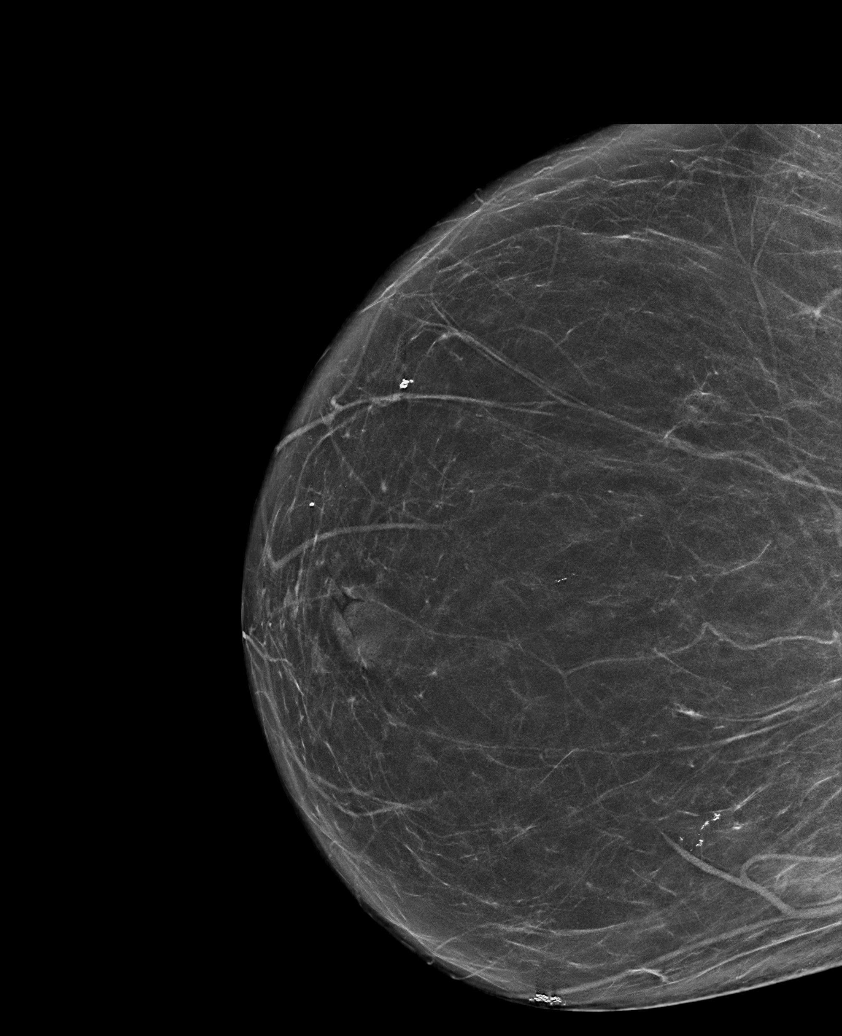

[R CC synth-2D (2 of 2)]
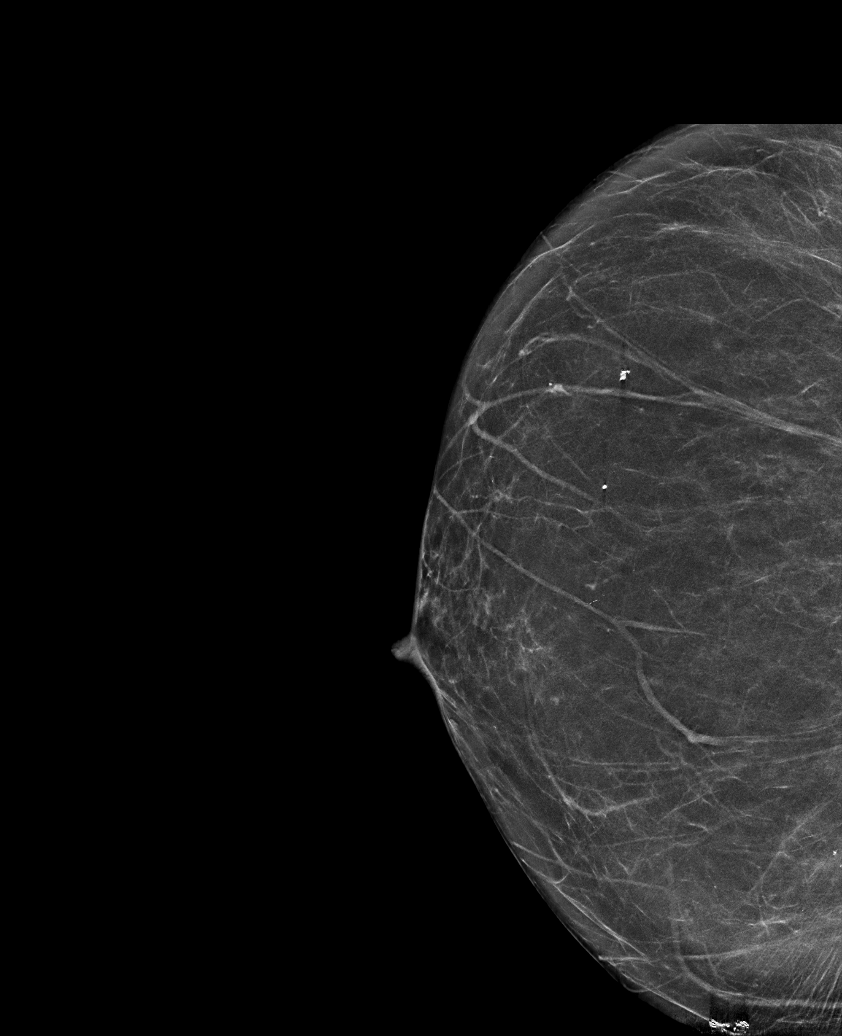

[L CC synth-2D (2 of 2)]
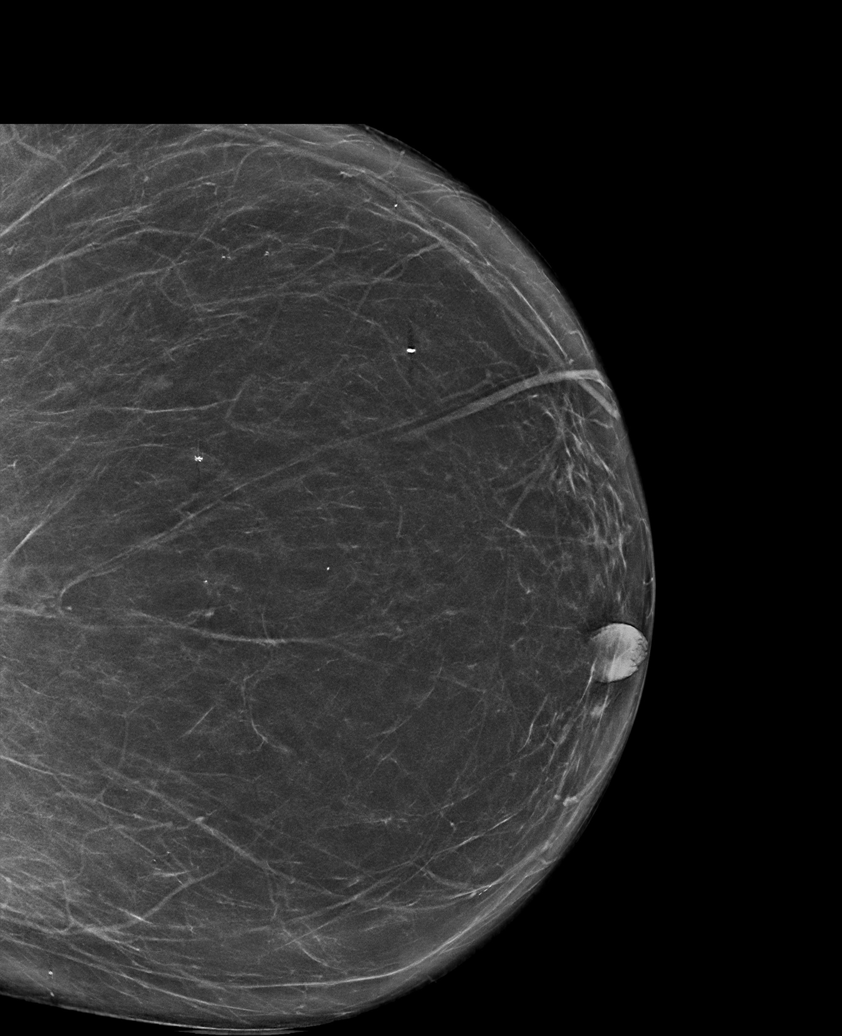

[6 of 36 positions shown; findings below may reference images not displayed]

FINDINGS: There are no findings suspicious for malignancy. Images were
processed with CAD.
IMPRESSION: No mammographic evidence of malignancy. A result letter of this
screening mammogram will be mailed directly to the patient.

RECOMMENDATION:
Screening mammogram in one year. (Code:[id number])

BI-RADS CATEGORY  1: Negative.

## 2020-03-02 ENCOUNTER — Other Ambulatory Visit (INDEPENDENT_AMBULATORY_CARE_PROVIDER_SITE_OTHER): Payer: Medicare Other

## 2020-03-02 ENCOUNTER — Ambulatory Visit (INDEPENDENT_AMBULATORY_CARE_PROVIDER_SITE_OTHER): Payer: Medicare Other | Admitting: Pharmacist

## 2020-03-02 DIAGNOSIS — Z7901 Long term (current) use of anticoagulants: Secondary | ICD-10-CM

## 2020-03-02 DIAGNOSIS — I82402 Acute embolism and thrombosis of unspecified deep veins of left lower extremity: Secondary | ICD-10-CM

## 2020-03-02 DIAGNOSIS — I10 Essential (primary) hypertension: Secondary | ICD-10-CM

## 2020-03-02 LAB — POCT INR: INR: 2.6 (ref 2.0–3.0)

## 2020-03-02 NOTE — Progress Notes (Signed)
Anticoagulation Management Caroline [name] is a 75 y.o. female who reports to the clinic for monitoring of warfarin treatment.    Indication: DVT , history of (LLE); Long term current use of warfarin anticoagulant with target INR 2.0 - 3.0.   Duration: indefinite Supervising physician: Earl Lagos  Anticoagulation Clinic Visit History: Patient does not report signs/symptoms of bleeding or thromboembolism  Other recent changes: No diet, medications, lifestyle changes endorsed by the patient at this visit.  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 Penicillins Anaphylaxis, Hives, Swelling and Rash    Has patient had a PCN reaction causing immediate rash, facial/tongue/throat swelling, SOB or lightheadedness with hypotension: Yes Has patient had a PCN reaction causing severe rash involving mucus membranes or skin necrosis: Yes Has patient had a PCN reaction that required hospitalization Yes Has patient had a PCN reaction occurring within the last 10 years: No If all of the above answers are "NO", then may proceed with Cephalosporin use.   . Cabbage Itching  . Keflex [Cephalexin] Hives    And feeling of throat tightness  . Shellfish Allergy Swelling  . Tomato Swelling  . Latex Itching and Rash    Current Outpatient Medications:  .  calcium citrate-vitamin D (CITRACAL+D) 315-200 MG-UNIT tablet, TAKE 2 TABLETS BY MOUTH EVERY DAY, Disp: 180 tablet, Rfl: 2 .  carvedilol (COREG) 12.5 MG tablet, TAKE 1 TABLET BY MOUTH TWICE A DAY, Disp: 180 tablet, Rfl:  3 .  EPINEPHrine 0.3 mg/0.3 mL IJ SOAJ injection, Inject 0.3 mg into the muscle as needed. Severe allergic reactions, Disp: , Rfl: 0 .  fluticasone (FLONASE) 50 MCG/ACT nasal spray, PLACE 2 SPRAYS DAILY INTO BOTH NOSTRILS., Disp: 48 mL, Rfl: 1 .  hydrocortisone (PROCTOSOL HC) 2.5 % rectal cream, Place 1 application rectally 2 (two) times daily., Disp: 30 g, Rfl: 0 .  losartan-hydrochlorothiazide (HYZAAR) 50-12.5 MG tablet, TAKE 1 TABLET BY MOUTH EVERY DAY, Disp: 90 tablet, Rfl: 3 .  meclizine (ANTIVERT) 25 MG tablet, TAKE 1 TABLET BY MOUTH THREE TIMES A DAY AS NEEDED, Disp: 90 tablet, Rfl: 5 .  montelukast (SINGULAIR) 10 MG tablet, Take 1 tablet (10 mg total) by mouth at bedtime., Disp: 90 tablet, Rfl: 3 .  pantoprazole (PROTONIX) 40 MG tablet, Take 1 tablet (40 mg total) by mouth daily., Disp: 90 tablet, Rfl: 1 .  PROAIR HFA 108 (90 Base) MCG/ACT inhaler, TAKE 2 PUFFS BY MOUTH EVERY 6 HOURS AS NEEDED FOR WHEEZE OR SHORTNESS OF BREATH (Patient taking differently: Inhale 2 puffs into the lungs every 6 (six) hours as needed for wheezing or shortness of breath.), Disp: 8.5 Inhaler, Rfl: 2 .  Psyllium (METAMUCIL PO), Take by mouth at bedtime. gummies, Disp: , Rfl:  .  RESTASIS 0.05 % ophthalmic emulsion, INSTILL 1 DROP INTO BOTH EYES TWICE A DAY, Disp: 60 mL, Rfl: 1 .  rosuvastatin (CRESTOR) 20 MG tablet, TAKE 1 TABLET BY MOUTH EVERY DAY, Disp: 90  tablet, Rfl: 3 .  SYMBICORT 160-4.5 MCG/ACT inhaler, TAKE 2 PUFFS BY MOUTH TWICE A DAY, Disp: 30.6 each, Rfl: 4 .  warfarin (COUMADIN) 4 MG tablet, Take one (1) of your blue, 4mg  strength warfarin tabletst on Sundays, Tuesdays, Thursdays and Saturdays. On Mondays, Wednesdays and Fridays, take only 1/2 tablet., Disp: 74 tablet, Rfl: 3 Past Medical History:  Diagnosis Date  . Arthritis   . Asthma   . Bronchitis   . CHF (congestive heart failure) (HCC)   . Diabetes (HCC) 2019  . Diverticulitis   . DVT, lower extremity, recurrent (HCC)    Patient had  unprovoked PE on 2002 and DVT in right lower extremety 2008.  2009 GERD (gastroesophageal reflux disease)   . Hematuria, microscopic 09/13/2017  . History of kidney stones   . Hypertension   . PE (pulmonary embolism)    Patient had unprovoked PE on 2002  . PONV (postoperative nausea and vomiting)   . Vertigo    Social History   Socioeconomic History  . Marital status: Divorced    Spouse name: Not on file  . Number of children: 5  . Years of education: 9 th grade  . Highest education level: 9th grade  Occupational History  . Occupation: retired  Tobacco Use  . Smoking status: Former Smoker    Types: Cigarettes    Quit date: 09/18/1988    Years since quitting: 31.4  . Smokeless tobacco: Never Used  . Tobacco comment: 6 pack year smoking history as a teen  Vaping Use  . Vaping Use: Never used  Substance and Sexual Activity  . Alcohol use: No  . Drug use: No  . Sexual activity: Not Currently    Birth control/protection: None  Other Topics Concern  . Not on file  Social History Narrative   Current Social History 08/28/2019   Patient lives with 15 yo granddaughter in a one story home. There are not steps up to the entrance the patient uses.       Patient's method of transportation is church member.      The highest level of education was 9th grade      The patient currently retired.      Identified important Relationships are God, family       Pets : 1 lab/pitt mix named Cinnamon       Interests / Fun: Church,TV       Current Stressors: "Me and my granddaughter"       Religious / Personal Beliefs: I'm Holiness and I love people"       Other: "I'd give away my last dime."       L. Ducatte, BSN, RN-BC    Social Determinants of Health   Financial Resource Strain: Not on file  Food Insecurity: Food Insecurity Present  . Worried About 22 in the Last Year: Sometimes true  . Ran Out of Food in the Last Year: Never true  Transportation Needs: Not on file   Physical Activity: Not on file  Stress: Not on file  Social Connections: Not on file   Family History  Problem Relation Age of Onset  . Cancer Mother   . Hypertension Sister   . Diabetes Sister   . Hypertension Brother   . Diabetes Brother   . Hypertension Sister   . Diabetes Sister   . Colon cancer Other   . Heart Problems Other 34       sister's child, open heart surgery  .  CVA Father   . Diabetes Son   . Hypertension Son   . Kidney disease Son        on dialysis  . Hypertension Sister   . Diabetes Sister   . Hypertension Sister   . Diabetes Sister   . Cancer Brother   . Hypertension Son   . Diabetes Son   . Hypertension Daughter   . Schizophrenia Daughter     ASSESSMENT Recent Results: The most recent result is correlated with 18 mg per week: Lab Results  Component Value Date   INR 2.6 03/02/2020   INR 4.3 (A) 02/10/2020   INR 2.5 01/20/2020    Anticoagulation Dosing: Description   Take one (1) of your 4 mg blue warfarin tablets on Saturdays and Sundays. All other days, take only one-half (1/2) of your 4mg  blue warfarin tablets.     INR today: Therapeutic  PLAN Weekly dose was unchanged.   Patient Instructions  Patient instructed to take medications as defined in the Anti-coagulation Track section of this encounter.  Patient instructed to take today's dose.  Patient instructed to take one (1) of your 4 mg blue warfarin tablets on Saturdays and Sundays. All other days, take only one-half (1/2) of your 4mg  blue warfarin tablets. Patient verbalized understanding of these instructions.    Patient advised to contact clinic or seek medical attention if signs/symptoms of bleeding or thromboembolism occur.  Patient verbalized understanding by repeating back information and was advised to contact me if further medication-related questions arise. Patient was also provided an information handout.  Follow-up Return in 3 weeks (on 03/23/2020) for Follow up  INR.  , PharmD, CPP  15 minutes spent face-to-face with the patient during the encounter. 50% of time spent on education, including signs/sx bleeding and clotting, as well as food and drug interactions with warfarin. 50% of time was spent on fingerprick POC INR sample collection,processing, results determination, and documentation in 03/25/2020.

## 2020-03-02 NOTE — Patient Instructions (Signed)
Patient instructed to take medications as defined in the Anti-coagulation Track section of this encounter.  Patient instructed to take today's dose.  Patient instructed to take one (1) of your 4 mg blue warfarin tablets on Saturdays and Sundays. All other days, take only one-half (1/2) of your 4mg  blue warfarin tablets. Patient verbalized understanding of these instructions.

## 2020-03-03 LAB — BMP8+ANION GAP
Anion Gap: 15 mmol/L (ref 10.0–18.0)
BUN/Creatinine Ratio: 15 (ref 12–28)
BUN: 13 mg/dL (ref 8–27)
CO2: 27 mmol/L (ref 20–29)
Calcium: 9.6 mg/dL (ref 8.7–10.3)
Chloride: 98 mmol/L (ref 96–106)
Creatinine, Ser: 0.86 mg/dL (ref 0.57–1.00)
GFR calc Af Amer: 78 mL/min/{1.73_m2} (ref >59)
GFR calc non Af Amer: 67 mL/min/{1.73_m2} (ref >59)
Glucose: 168 mg/dL — ABNORMAL HIGH (ref 65–99)
Potassium: 4.1 mmol/L (ref 3.5–5.2)
Sodium: 140 mmol/L (ref 134–144)

## 2020-03-05 ENCOUNTER — Other Ambulatory Visit: Payer: Self-pay | Admitting: Student in an Organized Health Care Education/Training Program

## 2020-03-05 ENCOUNTER — Other Ambulatory Visit: Payer: Self-pay | Admitting: Cardiology

## 2020-03-05 ENCOUNTER — Other Ambulatory Visit: Payer: Self-pay | Admitting: Internal Medicine

## 2020-03-05 DIAGNOSIS — K219 Gastro-esophageal reflux disease without esophagitis: Secondary | ICD-10-CM

## 2020-03-05 DIAGNOSIS — I10 Essential (primary) hypertension: Secondary | ICD-10-CM

## 2020-03-05 DIAGNOSIS — I5042 Chronic combined systolic (congestive) and diastolic (congestive) heart failure: Secondary | ICD-10-CM

## 2020-03-05 NOTE — Progress Notes (Signed)
INTERNAL MEDICINE TEACHING ATTENDING ADDENDUM - Abigaelle Verley M.D  Duration- indefinite, Indication- recurrent VTE, INR- therapeutic. Agree with pharmacy recommendations as outlined in their note.     

## 2020-03-23 ENCOUNTER — Ambulatory Visit (INDEPENDENT_AMBULATORY_CARE_PROVIDER_SITE_OTHER): Payer: Medicare Other | Admitting: Pharmacist

## 2020-03-23 DIAGNOSIS — I82409 Acute embolism and thrombosis of unspecified deep veins of unspecified lower extremity: Secondary | ICD-10-CM

## 2020-03-23 LAB — POCT INR: INR: 2.4 (ref 2.0–3.0)

## 2020-03-23 NOTE — Progress Notes (Signed)
Anticoagulation Management Caroline [name] is a 75 y.o. female who reports to the clinic for monitoring of warfarin treatment.    Indication: DVT  Duration: indefinite Supervising physician: Earl Lagos  Anticoagulation Clinic Visit History: Patient does not report signs/symptoms of bleeding or thromboembolism  Other recent changes: no changes to diet, medications, lifestyle Anticoagulation Episode Summary    Current INR goal:  2.0-3.0  TTR:  82.8 % (5.1 y)  Next INR check:  04/13/2020  INR from last check:  2.4 (03/23/2020)  Weekly max warfarin dose:    Target end date:    INR check location:  Anticoagulation Clinic  Preferred lab:    Send INR reminders to:  [facility]   Indications   DVT lower extremity recurrent (HCC) [I82.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 Penicillins Anaphylaxis, Hives, Swelling and Rash    Has patient had a PCN reaction causing immediate rash, facial/tongue/throat swelling, SOB or lightheadedness with hypotension: Yes Has patient had a PCN reaction causing severe rash involving mucus membranes or skin necrosis: Yes Has patient had a PCN reaction that required hospitalization Yes Has patient had a PCN reaction occurring within the last 10 years: No If all of the above answers are "NO", then may proceed with Cephalosporin use.   . Cabbage Itching  . Keflex [Cephalexin] Hives    And feeling of throat tightness  . Shellfish Allergy Swelling  . Tomato Swelling  . Latex Itching and Rash    Current Outpatient Medications:  .  calcium citrate-vitamin D (CITRACAL+D) 315-200 MG-UNIT tablet, TAKE 2 TABLETS BY MOUTH EVERY DAY, Disp: 180 tablet, Rfl: 2 .  carvedilol (COREG) 12.5 MG tablet, Take 1 tablet (12.5 mg total) by mouth 2 (two) times daily. Pt needs to schedule appt with provider before more refills - 1st attempt, Disp: 60 tablet, Rfl: 0 .  EPINEPHrine 0.3 mg/0.3 mL IJ  SOAJ injection, Inject 0.3 mg into the muscle as needed. Severe allergic reactions, Disp: , Rfl: 0 .  fluticasone (FLONASE) 50 MCG/ACT nasal spray, PLACE 2 SPRAYS DAILY INTO BOTH NOSTRILS., Disp: 48 mL, Rfl: 1 .  hydrocortisone (PROCTOSOL HC) 2.5 % rectal cream, Place 1 application rectally 2 (two) times daily., Disp: 30 g, Rfl: 0 .  losartan-hydrochlorothiazide (HYZAAR) 50-12.5 MG tablet, TAKE 1 TABLET BY MOUTH EVERY DAY, Disp: 90 tablet, Rfl: 3 .  meclizine (ANTIVERT) 25 MG tablet, TAKE 1 TABLET BY MOUTH THREE TIMES A DAY AS NEEDED, Disp: 90 tablet, Rfl: 5 .  montelukast (SINGULAIR) 10 MG tablet, Take 1 tablet (10 mg total) by mouth at bedtime., Disp: 90 tablet, Rfl: 3 .  pantoprazole (PROTONIX) 40 MG tablet, TAKE 1 TABLET BY MOUTH EVERY DAY, Disp: 90 tablet, Rfl: 1 .  PROAIR HFA 108 (90 Base) MCG/ACT inhaler, TAKE 2 PUFFS BY MOUTH EVERY 6 HOURS AS NEEDED FOR WHEEZE OR SHORTNESS OF BREATH (Patient taking differently: Inhale 2 puffs into the lungs every 6 (six) hours as needed for wheezing or shortness of breath.), Disp: 8.5 Inhaler, Rfl: 2 .  Psyllium (METAMUCIL PO), Take by mouth at bedtime. gummies, Disp: , Rfl:  .  RESTASIS 0.05 % ophthalmic emulsion, INSTILL 1 DROP INTO BOTH EYES TWICE A DAY, Disp: 60 mL, Rfl: 1 .  rosuvastatin (CRESTOR) 20 MG tablet, TAKE 1 TABLET BY MOUTH EVERY DAY, Disp: 90 tablet, Rfl: 3 .  SYMBICORT 160-4.5 MCG/ACT inhaler, TAKE  2 PUFFS BY MOUTH TWICE A DAY, Disp: 30.6 each, Rfl: 4 .  warfarin (COUMADIN) 4 MG tablet, Take one (1) of your blue, 4mg  strength warfarin tabletst on Sundays, Tuesdays, Thursdays and Saturdays. On Mondays, Wednesdays and Fridays, take only 1/2 tablet., Disp: 74 tablet, Rfl: 3 Past Medical History:  Diagnosis Date  . Arthritis   . Asthma   . Bronchitis   . CHF (congestive heart failure) (HCC)   . Diabetes (HCC) 2019  . Diverticulitis   . DVT, lower extremity, recurrent (HCC)    Patient had unprovoked PE on 2002 and DVT in right lower  extremety 2008.  2009 GERD (gastroesophageal reflux disease)   . Hematuria, microscopic 09/13/2017  . History of kidney stones   . Hypertension   . PE (pulmonary embolism)    Patient had unprovoked PE on 2002  . PONV (postoperative nausea and vomiting)   . Vertigo    Social History   Socioeconomic History  . Marital status: Divorced    Spouse name: Not on file  . Number of children: 5  . Years of education: 9 th grade  . Highest education level: 9th grade  Occupational History  . Occupation: retired  Tobacco Use  . Smoking status: Former Smoker    Types: Cigarettes    Quit date: 09/18/1988    Years since quitting: 31.5  . Smokeless tobacco: Never Used  . Tobacco comment: 6 pack year smoking history as a teen  Vaping Use  . Vaping Use: Never used  Substance and Sexual Activity  . Alcohol use: No  . Drug use: No  . Sexual activity: Not Currently    Birth control/protection: None  Other Topics Concern  . Not on file  Social History Narrative   Current Social History 08/28/2019   Patient lives with 5 yo granddaughter in a one story home. There are not steps up to the entrance the patient uses.       Patient's method of transportation is church member.      The highest level of education was 9th grade      The patient currently retired.      Identified important Relationships are God, family       Pets : 1 lab/pitt mix named Cinnamon       Interests / Fun: Church,TV       Current Stressors: "Me and my granddaughter"       Religious / Personal Beliefs: I'm Holiness and I love people"       Other: "I'd give away my last dime."       L. Ducatte, BSN, RN-BC    Social Determinants of Health   Financial Resource Strain: Not on file  Food Insecurity: Food Insecurity Present  . Worried About 22 in the Last Year: Sometimes true  . Ran Out of Food in the Last Year: Never true  Transportation Needs: Not on file  Physical Activity: Not on file  Stress: Not  on file  Social Connections: Not on file   Family History  Problem Relation Age of Onset  . Cancer Mother   . Hypertension Sister   . Diabetes Sister   . Hypertension Brother   . Diabetes Brother   . Hypertension Sister   . Diabetes Sister   . Colon cancer Other   . Heart Problems Other 34       sister's child, open heart surgery  . CVA Father   . Diabetes Son   .  Hypertension Son   . Kidney disease Son        on dialysis  . Hypertension Sister   . Diabetes Sister   . Hypertension Sister   . Diabetes Sister   . Cancer Brother   . Hypertension Son   . Diabetes Son   . Hypertension Daughter   . Schizophrenia Daughter     ASSESSMENT Recent Results: The most recent result is correlated with 18 mg per week: Lab Results  Component Value Date   INR 2.4 03/23/2020   INR 2.6 03/02/2020   INR 4.3 (A) 02/10/2020    Anticoagulation Dosing: Description   Take one (1) of your 4 mg blue warfarin tablets on Saturdays and Sundays. All other days, take only one-half (1/2) of your 4mg  blue warfarin tablets.     INR today: Therapeutic  PLAN Weekly dose was unchanged  Patient Instructions  Patient instructed to take medications as defined in the Anti-coagulation Track section of this encounter.  Patient instructed to take today's dose.  Patient instructed to take one (1) of your 4 mg blue warfarin tablets on Saturdays and Sundays. All other days, take only one-half (1/2) of your 4mg  blue warfarin tablets Patient verbalized understanding of these instructions.     Patient advised to contact clinic or seek medical attention if signs/symptoms of bleeding or thromboembolism occur.  Patient verbalized understanding by repeating back information and was advised to contact me if further medication-related questions arise. Patient was also provided an information handout.  Follow-up Return in about 3 weeks (around 04/13/2020) for follow up INR at 10:30 AM.  ,  PharmD PGY1 Pharmacy Resident 03/23/2020 10:58 AM  15 minutes spent face-to-face with the patient during the encounter. 50% of time spent on education, including signs/sx bleeding and clotting, as well as food and drug interactions with warfarin. 50% of time was spent on fingerprick POC INR sample collection,processing, results determination, and documentation in [place] [facility].

## 2020-03-23 NOTE — Patient Instructions (Signed)
Patient instructed to take medications as defined in the Anti-coagulation Track section of this encounter.  Patient instructed to take today's dose.  Patient instructed to take one (1) of your 4 mg blue warfarin tablets on Saturdays and Sundays. All other days, take only one-half (1/2) of your 4mg  blue warfarin tablets Patient verbalized understanding of these instructions.

## 2020-03-24 ENCOUNTER — Encounter: Payer: Self-pay | Admitting: Internal Medicine

## 2020-03-24 ENCOUNTER — Ambulatory Visit (INDEPENDENT_AMBULATORY_CARE_PROVIDER_SITE_OTHER): Payer: Medicare Other | Admitting: Internal Medicine

## 2020-03-24 VITALS — BP 149/66 | HR 81 | Temp 98.3°F | Ht 61.0 in | Wt 285.0 lb

## 2020-03-24 DIAGNOSIS — Z6841 Body Mass Index (BMI) 40.0 and over, adult: Secondary | ICD-10-CM

## 2020-03-24 DIAGNOSIS — I82409 Acute embolism and thrombosis of unspecified deep veins of unspecified lower extremity: Secondary | ICD-10-CM | POA: Diagnosis not present

## 2020-03-24 DIAGNOSIS — E118 Type 2 diabetes mellitus with unspecified complications: Secondary | ICD-10-CM

## 2020-03-24 DIAGNOSIS — I5042 Chronic combined systolic (congestive) and diastolic (congestive) heart failure: Secondary | ICD-10-CM

## 2020-03-24 DIAGNOSIS — E559 Vitamin D deficiency, unspecified: Secondary | ICD-10-CM | POA: Diagnosis not present

## 2020-03-24 DIAGNOSIS — I10 Essential (primary) hypertension: Secondary | ICD-10-CM | POA: Diagnosis not present

## 2020-03-24 DIAGNOSIS — K219 Gastro-esophageal reflux disease without esophagitis: Secondary | ICD-10-CM | POA: Diagnosis not present

## 2020-03-24 DIAGNOSIS — Z0000 Encounter for general adult medical examination without abnormal findings: Secondary | ICD-10-CM

## 2020-03-24 LAB — GLUCOSE, CAPILLARY: Glucose-Capillary: 157 mg/dL — ABNORMAL HIGH (ref 70–99)

## 2020-03-24 LAB — POCT GLYCOSYLATED HEMOGLOBIN (HGB A1C): Hemoglobin A1C: 7.7 % — AB (ref 4.0–5.6)

## 2020-03-24 MED ORDER — METFORMIN HCL ER 500 MG PO TB24: 500.0000 mg | ORAL_TABLET | Freq: Two times a day (BID) | ORAL | 1 refills | Status: DC

## 2020-03-24 NOTE — Patient Instructions (Addendum)
-  It was a pleasure seeing you today -We will check some blood work including your kidney function, calcium and vitamin D levels -I have referred you to [name] for help with diabetes education and diet plan -I asked restart your Metformin -Please call me if you have any questions or concerns

## 2020-03-24 NOTE — Progress Notes (Signed)
   Subjective:    Patient ID: Caroline [name], female    DOB: January 04, 1946, 75 y.o.   MRN: [id number]  HPI  I have seen and examined this patient.  Patient is here for routine follow-up of her hypertension and diabetes.  Patient states that she feels well today denies any new complaints.  She states that she has been trying to be more active.  She states that she is compliant with all her medications.  She does state that she is taking a calcium/vitamin D supplement and this is causing her $14 a month.  She is unsure if she needs to take this or not.  Review of Systems  Constitutional: Negative.   HENT: Negative.   Respiratory: Negative.   Cardiovascular: Negative.   Gastrointestinal: Negative.   Musculoskeletal: Negative.   Neurological: Negative.   Psychiatric/Behavioral: Negative.        Objective:   Physical Exam Constitutional:      Appearance: Normal appearance.  HENT:     Head: Normocephalic and atraumatic.  Cardiovascular:     Rate and Rhythm: Normal rate and regular rhythm.     Heart sounds: Normal heart sounds.  Pulmonary:     Breath sounds: Normal breath sounds. No wheezing or rales.  Abdominal:     General: Bowel sounds are normal. There is no distension.     Palpations: Abdomen is soft.     Tenderness: There is no abdominal tenderness.  Musculoskeletal:        General: No swelling or tenderness.     Cervical back: Neck supple.  Lymphadenopathy:     Cervical: No cervical adenopathy.  Neurological:     General: No focal deficit present.     Mental Status: She is alert and oriented to person, place, and time.  Psychiatric:        Mood and Affect: Mood normal.        Behavior: Behavior normal.           Assessment & Plan:  Please see problem based charting for assessment and plan:

## 2020-03-24 NOTE — Assessment & Plan Note (Signed)
-  This problem is chronic and stable -Patient symptoms are well controlled on Protonix 40 mg daily -No further work-up at this time

## 2020-03-24 NOTE — Assessment & Plan Note (Addendum)
-  Patient states that she does not want a Covid vaccine at this time -We also discussed the possibility of colonoscopy/annual FOBT but she wants to defer the decision at this time.  She does not want to have a colonoscopy for screening but is also unsure if her FOBT came back positive if she would want to undergo colonoscopy for diagnosis.  We will discuss this at her next visit -Patient did have a DEXA scan in 2018 within normal T score.  She is on a calcium/vitamin D supplement.  She likely does not need calcium supplementation if her dietary intake of calcium is sufficient.  We will follow-up vitamin D level today to see if we should continue to supplement her vitamin D  Addendum: - Patient did have a history of a low 1.25 Vit D. Vitamin D level checked

## 2020-03-24 NOTE — Assessment & Plan Note (Signed)
Lab Results  Component Value Date   HGBA1C 7.7 (A) 03/24/2020   HGBA1C 6.2 (A) 10/01/2019   HGBA1C 5.5 03/19/2019     Assessment: Diabetes control:  Fair Progress toward A1C goal:   Deteriorated Comments: Patient was taken off Metformin once her A1c was less than 6 but since then her A1c has been worsening and she is now at 7.7  Plan: Medications:  We will start the patient back on Metformin 500 mg and titrate her up to 500 mg twice daily Home glucose monitoring: Frequency:   Timing:   Instruction/counseling given: discussed the need for weight loss Educational resources provided:   Self management tools provided:   Other plans: We will refer the patient to [name] for weight plan and diabetes counseling

## 2020-03-24 NOTE — Assessment & Plan Note (Signed)
-  Patient with history of chronic combined systolic and diastolic heart failure -This problem is chronic and stable -Patient with no edema currently on exam -Patient's last echo was done in March 2021 which showed an EF of 40 to 45% with grade 1 diastolic dysfunction -She states that she has not had any recurrent lower extremity edema and is not currently on any loop diuretics -We will continue with carvedilol 12.5 mg twice daily, losartan 50 mg daily and hydrochlorothiazide 12.5 mg daily -No further work-up at this time

## 2020-03-24 NOTE — Assessment & Plan Note (Signed)
-  Patient noted to have increasing weights over the last year and is now up to 285 pounds with a BMI of 53 -Explained the patient importance of weight loss and we talked about diet and exercise -We will refer patient to nutrition counseling with [name] -We will follow up at next visit

## 2020-03-24 NOTE — Assessment & Plan Note (Signed)
-  This problem is chronic and stable -Patient has a history of recurrent VTE and is on chronic Coumadin for this -Patient did follow-up with Dr. Michaell Cowing yesterday and was noted to have an INR of 2.4 which is at goal -No further work-up at this time

## 2020-03-24 NOTE — Assessment & Plan Note (Signed)
BP Readings from Last 3 Encounters:  03/24/20 (!) 149/66  11/25/19 136/72  10/29/19 (!) 148/58    Lab Results  Component Value Date   NA 140 03/02/2020   K 4.1 03/02/2020   CREATININE 0.86 03/02/2020    Assessment: Blood pressure control:  Fair Progress toward BP goal:   Deteriorated Comments: Patient states that she is compliant with all her medications including losartan/HCTZ 50/12.5 mg daily and carvedilol 12.5 mg twice daily  Plan: Medications:  continue current medications Educational resources provided:   Self management tools provided:   Other plans: The patient blood pressure remains elevated on follow-up visit would consider starting patient on a low-dose of amlodipine

## 2020-03-25 ENCOUNTER — Telehealth: Payer: Self-pay | Admitting: Internal Medicine

## 2020-03-25 ENCOUNTER — Other Ambulatory Visit: Payer: Self-pay | Admitting: Student in an Organized Health Care Education/Training Program

## 2020-03-25 ENCOUNTER — Other Ambulatory Visit: Payer: Self-pay | Admitting: Cardiology

## 2020-03-25 ENCOUNTER — Other Ambulatory Visit: Payer: Self-pay | Admitting: Internal Medicine

## 2020-03-25 DIAGNOSIS — I5042 Chronic combined systolic (congestive) and diastolic (congestive) heart failure: Secondary | ICD-10-CM

## 2020-03-25 DIAGNOSIS — I10 Essential (primary) hypertension: Secondary | ICD-10-CM

## 2020-03-25 LAB — BMP8+ANION GAP
Anion Gap: 16 mmol/L (ref 10.0–18.0)
BUN/Creatinine Ratio: 13 (ref 12–28)
BUN: 11 mg/dL (ref 8–27)
CO2: 26 mmol/L (ref 20–29)
Calcium: 9.5 mg/dL (ref 8.7–10.3)
Chloride: 100 mmol/L (ref 96–106)
Creatinine, Ser: 0.88 mg/dL (ref 0.57–1.00)
Glucose: 166 mg/dL — ABNORMAL HIGH (ref 65–99)
Potassium: 3.7 mmol/L (ref 3.5–5.2)
Sodium: 142 mmol/L (ref 134–144)
eGFR: 69 mL/min/{1.73_m2} (ref >59)

## 2020-03-25 LAB — VITAMIN D 25 HYDROXY (VIT D DEFICIENCY, FRACTURES): Vit D, 25-Hydroxy: 38.8 ng/mL (ref 30.0–100.0)

## 2020-03-25 NOTE — Telephone Encounter (Signed)
I called the patient to discuss the results of her blood work with her.  Patient had a normal BMP except for mildly elevated blood glucose of 166.  Patient's 25-hydroxy vitamin D level was within normal limits.  No further work-up at this time.  Patient expresses understanding and is in agreement with plan.    Patient is on a calcium/vitamin D supplement.  Given her normal serum calcium levels will consider stopping the supplement.  We will discuss this with her in detail at her next office visit.

## 2020-03-25 NOTE — Progress Notes (Signed)
INTERNAL MEDICINE TEACHING ATTENDING ADDENDUM - [name] M.D  Duration- indefinite, Indication- recurrent VTE, INR- therapeutic. Agree with pharmacy recommendations as outlined in their note.     

## 2020-04-10 ENCOUNTER — Other Ambulatory Visit: Payer: Self-pay | Admitting: Internal Medicine

## 2020-04-13 ENCOUNTER — Ambulatory Visit (INDEPENDENT_AMBULATORY_CARE_PROVIDER_SITE_OTHER): Payer: Medicare Other | Admitting: Pharmacist

## 2020-04-13 DIAGNOSIS — I82409 Acute embolism and thrombosis of unspecified deep veins of unspecified lower extremity: Secondary | ICD-10-CM | POA: Diagnosis not present

## 2020-04-13 LAB — POCT INR: INR: 2.5 (ref 2.0–3.0)

## 2020-04-13 NOTE — Progress Notes (Signed)
Chart reviewed; I agree with Dr. Mitchell's documentation. 

## 2020-04-13 NOTE — Patient Instructions (Signed)
Patient instructed to take medications as defined in the Anti-coagulation Track section of this encounter.  Patient instructed to take today's dose.  Patient instructed to take one (1) of your 4 mg blue warfarin tablets on Saturdays and Sundays. All other days, take only one-half (1/2) of your 4mg blue warfarin tablets. Patient verbalized understanding of these instructions.   

## 2020-04-13 NOTE — Progress Notes (Signed)
Anticoagulation Management Caroline [name] is a 75 y.o. female who reports to the clinic for monitoring of warfarin treatment.    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 Penicillins Anaphylaxis, Hives, Swelling and Rash    Has patient had a PCN reaction causing immediate rash, facial/tongue/throat swelling, SOB or lightheadedness with hypotension: Yes Has patient had a PCN reaction causing severe rash involving mucus membranes or skin necrosis: Yes Has patient had a PCN reaction that required hospitalization Yes Has patient had a PCN reaction occurring within the last 10 years: No If all of the above answers are "NO", then may proceed with Cephalosporin use.   . Cabbage Itching  . Keflex [Cephalexin] Hives    And feeling of throat tightness  . Shellfish Allergy Swelling  . Tomato Swelling  . Latex Itching and Rash    Current Outpatient Medications:  .  calcium citrate-vitamin D (CITRACAL+D) 315-200 MG-UNIT tablet, TAKE 2 TABLETS BY MOUTH EVERY DAY, Disp: 180 tablet, Rfl: 2 .  carvedilol (COREG) 12.5 MG tablet, Take 1 tablet (12.5 mg total) by mouth 2 (two) times daily., Disp: 30 tablet, Rfl: 0 .  EPINEPHrine 0.3 mg/0.3 mL IJ SOAJ injection, Inject 0.3 mg into the muscle as needed. Severe allergic  reactions, Disp: , Rfl: 0 .  fluticasone (FLONASE) 50 MCG/ACT nasal spray, PLACE 2 SPRAYS DAILY INTO BOTH NOSTRILS., Disp: 48 mL, Rfl: 1 .  hydrocortisone (PROCTOSOL HC) 2.5 % rectal cream, Place 1 application rectally 2 (two) times daily., Disp: 30 g, Rfl: 0 .  losartan-hydrochlorothiazide (HYZAAR) 50-12.5 MG tablet, TAKE 1 TABLET BY MOUTH EVERY DAY, Disp: 90 tablet, Rfl: 3 .  meclizine (ANTIVERT) 25 MG tablet, TAKE 1 TABLET BY MOUTH THREE TIMES A DAY AS NEEDED, Disp: 90 tablet, Rfl: 5 .  metFORMIN (GLUCOPHAGE-XR) 500 MG 24 hr tablet, Take 1 tablet (500 mg total) by mouth 2 (two) times daily before a meal. Start with 500 mg 1*day for 1 week and then increase to 500 mg 2*day, Disp: 180 tablet, Rfl: 1 .  montelukast (SINGULAIR) 10 MG tablet, Take 1 tablet (10 mg total) by mouth at bedtime., Disp: 90 tablet, Rfl: 3 .  pantoprazole (PROTONIX) 40 MG tablet, TAKE 1 TABLET BY MOUTH EVERY DAY, Disp: 90 tablet, Rfl: 1 .  PROAIR HFA 108 (90 Base) MCG/ACT inhaler, TAKE 2 PUFFS BY MOUTH EVERY 6 HOURS AS NEEDED FOR WHEEZE OR SHORTNESS OF BREATH (Patient taking differently: Inhale 2 puffs into the lungs every 6 (six) hours as needed for wheezing or shortness of breath.), Disp: 8.5 Inhaler, Rfl: 2 .  Psyllium (METAMUCIL PO), Take by mouth at bedtime. gummies, Disp: , Rfl:  .  RESTASIS 0.05 % ophthalmic emulsion, INSTILL 1 DROP INTO BOTH EYES TWICE A  DAY, Disp: 60 mL, Rfl: 1 .  rosuvastatin (CRESTOR) 20 MG tablet, TAKE 1 TABLET BY MOUTH EVERY DAY, Disp: 90 tablet, Rfl: 3 .  SYMBICORT 160-4.5 MCG/ACT inhaler, TAKE 2 PUFFS BY MOUTH TWICE A DAY, Disp: 30.6 each, Rfl: 4 .  warfarin (COUMADIN) 4 MG tablet, Take one (1) of your blue, 4mg  strength warfarin tabletst on Sundays, Tuesdays, Thursdays and Saturdays. On Mondays, Wednesdays and Fridays, take only 1/2 tablet., Disp: 74 tablet, Rfl: 3 Past Medical History:  Diagnosis Date  . Arthritis   . Asthma   . Bronchitis   . CHF (congestive heart failure) (HCC)   .  Diabetes (HCC) 2019  . Diverticulitis   . DVT, lower extremity, recurrent (HCC)    Patient had unprovoked PE on 2002 and DVT in right lower extremety 2008.  2009 GERD (gastroesophageal reflux disease)   . Hematuria, microscopic 09/13/2017  . History of kidney stones   . Hypertension   . PE (pulmonary embolism)    Patient had unprovoked PE on 2002  . PONV (postoperative nausea and vomiting)   . Vertigo    Social History   Socioeconomic History  . Marital status: Divorced    Spouse name: Not on file  . Number of children: 5  . Years of education: 9 th grade  . Highest education level: 9th grade  Occupational History  . Occupation: retired  Tobacco Use  . Smoking status: Former Smoker    Types: Cigarettes    Quit date: 09/18/1988    Years since quitting: 31.5  . Smokeless tobacco: Never Used  . Tobacco comment: 6 pack year smoking history as a teen  Vaping Use  . Vaping Use: Never used  Substance and Sexual Activity  . Alcohol use: No  . Drug use: No  . Sexual activity: Not Currently    Birth control/protection: None  Other Topics Concern  . Not on file  Social History Narrative   Current Social History 08/28/2019   Patient lives with 2 yo granddaughter in a one story home. There are not steps up to the entrance the patient uses.       Patient's method of transportation is church member.      The highest level of education was 9th grade      The patient currently retired.      Identified important Relationships are God, family       Pets : 1 lab/pitt mix named Cinnamon       Interests / Fun: Church,TV       Current Stressors: "Me and my granddaughter"       Religious / Personal Beliefs: I'm Holiness and I love people"       Other: "I'd give away my last dime."       L. Ducatte, BSN, RN-BC    Social Determinants of Health   Financial Resource Strain: Not on file  Food Insecurity: Food Insecurity Present  . Worried About 22 in the Last Year:  Sometimes true  . Ran Out of Food in the Last Year: Never true  Transportation Needs: Not on file  Physical Activity: Not on file  Stress: Not on file  Social Connections: Not on file   Family History  Problem Relation Age of Onset  . Cancer Mother   . Hypertension Sister   . Diabetes Sister   . Hypertension Brother   . Diabetes Brother   . Hypertension Sister   . Diabetes Sister   .  Colon cancer Other   . Heart Problems Other 34       sister's child, open heart surgery  . CVA Father   . Diabetes Son   . Hypertension Son   . Kidney disease Son        on dialysis  . Hypertension Sister   . Diabetes Sister   . Hypertension Sister   . Diabetes Sister   . Cancer Brother   . Hypertension Son   . Diabetes Son   . Hypertension Daughter   . Schizophrenia Daughter     ASSESSMENT Recent Results: The most recent result is correlated with 18  mg per week: Lab Results  Component Value Date   INR 2.5 04/13/2020   INR 2.4 03/23/2020   INR 2.6 03/02/2020    Anticoagulation Dosing: Description   Take one (1) of your 4 mg blue warfarin tablets on Saturdays and Sundays. All other days, take only one-half (1/2) of your 4mg  blue warfarin tablets.     INR today: Therapeutic  PLAN Weekly dose was unchanged.   Patient Instructions  Patient instructed to take medications as defined in the Anti-coagulation Track section of this encounter.  Patient instructed to take today's dose.  Patient instructed to take one (1) of your 4 mg blue warfarin tablets on Saturdays and Sundays. All other days, take only one-half (1/2) of your 4mg  blue warfarin tablets. Patient verbalized understanding of these instructions.  .  Patient advised to contact clinic or seek medical attention if signs/symptoms of bleeding or thromboembolism occur.  Patient verbalized understanding by repeating back information and was advised to contact me if further medication-related questions arise. Patient was also  provided an information handout.  Follow-up Return in 6 weeks (on 05/25/2020) for Follow up INR at 10:45 AM.  , PharmD PGY1 Pharmacy Resident 04/13/2020 11:02 AM   15 minutes spent face-to-face with the patient during the encounter. 50% of time spent on education, including signs/sx bleeding and clotting, as well as food and drug interactions with warfarin. 50% of time was spent on fingerprick POC INR sample collection,processing, results determination, and documentation in [place].

## 2020-04-14 ENCOUNTER — Other Ambulatory Visit: Payer: Self-pay | Admitting: Cardiology

## 2020-04-14 DIAGNOSIS — I10 Essential (primary) hypertension: Secondary | ICD-10-CM

## 2020-04-23 ENCOUNTER — Other Ambulatory Visit: Payer: Self-pay | Admitting: Cardiology

## 2020-04-23 DIAGNOSIS — I10 Essential (primary) hypertension: Secondary | ICD-10-CM

## 2020-05-04 ENCOUNTER — Encounter: Payer: Medicare Other | Admitting: Dietician

## 2020-05-25 ENCOUNTER — Ambulatory Visit (INDEPENDENT_AMBULATORY_CARE_PROVIDER_SITE_OTHER): Payer: Medicare Other | Admitting: Pharmacist

## 2020-05-25 DIAGNOSIS — I82409 Acute embolism and thrombosis of unspecified deep veins of unspecified lower extremity: Secondary | ICD-10-CM | POA: Diagnosis not present

## 2020-05-25 DIAGNOSIS — Z7901 Long term (current) use of anticoagulants: Secondary | ICD-10-CM

## 2020-05-25 LAB — POCT INR: INR: 2 (ref 2.0–3.0)

## 2020-05-25 NOTE — Patient Instructions (Signed)
Patient instructed to take medications as defined in the Anti-coagulation Track section of this encounter.  Patient instructed to take today's dose.  Patient instructed to take one (1) of your 4 mg blue warfarin tablets on Mondays, Wednesdays and Fridays. All other days, take only one-half (1/2) of your 4mg blue warfarin tablets. Patient verbalized understanding of these instructions.   

## 2020-05-25 NOTE — Progress Notes (Signed)
Anticoagulation Management Caroline [name] is a 75 y.o. female who reports to the clinic for monitoring of warfarin treatment.    Indication: DVT, history of recurrent; long term use of warfarin to maintain INR 2.0 - 3.0.  Duration: indefinite Supervising physician: Earl Lagos  Anticoagulation Clinic Visit History: Patient does not report signs/symptoms of bleeding or thromboembolism  Other recent changes: No diet, medications, lifestyle changes endorsed.  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 Penicillins Anaphylaxis, Hives, Swelling and Rash    Has patient had a PCN reaction causing immediate rash, facial/tongue/throat swelling, SOB or lightheadedness with hypotension: Yes Has patient had a PCN reaction causing severe rash involving mucus membranes or skin necrosis: Yes Has patient had a PCN reaction that required hospitalization Yes Has patient had a PCN reaction occurring within the last 10 years: No If all of the above answers are "NO", then may proceed with Cephalosporin use.   . Cabbage Itching  . Keflex [Cephalexin] Hives    And feeling of throat tightness  . Shellfish Allergy Swelling  . Tomato Swelling  . Latex Itching and Rash    Current Outpatient Medications:  .  calcium citrate-vitamin D (CITRACAL+D) 315-200 MG-UNIT tablet, TAKE 2 TABLETS BY MOUTH EVERY DAY, Disp: 180 tablet, Rfl: 2 .  carvedilol (COREG) 12.5 MG tablet, TAKE 1 TABLET BY MOUTH TWICE A DAY, Disp: 15 tablet, Rfl: 0 .  EPINEPHrine 0.3 mg/0.3 mL IJ SOAJ injection, Inject 0.3 mg  into the muscle as needed. Severe allergic reactions, Disp: , Rfl: 0 .  fluticasone (FLONASE) 50 MCG/ACT nasal spray, PLACE 2 SPRAYS DAILY INTO BOTH NOSTRILS., Disp: 48 mL, Rfl: 1 .  hydrocortisone (PROCTOSOL HC) 2.5 % rectal cream, Place 1 application rectally 2 (two) times daily., Disp: 30 g, Rfl: 0 .  losartan-hydrochlorothiazide (HYZAAR) 50-12.5 MG tablet, TAKE 1 TABLET BY MOUTH EVERY DAY, Disp: 90 tablet, Rfl: 3 .  meclizine (ANTIVERT) 25 MG tablet, TAKE 1 TABLET BY MOUTH THREE TIMES A DAY AS NEEDED, Disp: 90 tablet, Rfl: 5 .  metFORMIN (GLUCOPHAGE-XR) 500 MG 24 hr tablet, Take 1 tablet (500 mg total) by mouth 2 (two) times daily before a meal. Start with 500 mg 1*day for 1 week and then increase to 500 mg 2*day, Disp: 180 tablet, Rfl: 1 .  montelukast (SINGULAIR) 10 MG tablet, Take 1 tablet (10 mg total) by mouth at bedtime., Disp: 90 tablet, Rfl: 3 .  pantoprazole (PROTONIX) 40 MG tablet, TAKE 1 TABLET BY MOUTH EVERY DAY, Disp: 90 tablet, Rfl: 1 .  PROAIR HFA 108 (90 Base) MCG/ACT inhaler, TAKE 2 PUFFS BY MOUTH EVERY 6 HOURS AS NEEDED FOR WHEEZE OR SHORTNESS OF BREATH (Patient taking differently: Inhale 2 puffs into the lungs every 6 (six) hours as needed for wheezing or shortness of breath.), Disp: 8.5 Inhaler, Rfl: 2 .  Psyllium (METAMUCIL PO), Take by mouth at bedtime. gummies, Disp: , Rfl:  .  RESTASIS 0.05 % ophthalmic  emulsion, INSTILL 1 DROP INTO BOTH EYES TWICE A DAY, Disp: 60 mL, Rfl: 1 .  rosuvastatin (CRESTOR) 20 MG tablet, TAKE 1 TABLET BY MOUTH EVERY DAY, Disp: 90 tablet, Rfl: 3 .  SYMBICORT 160-4.5 MCG/ACT inhaler, TAKE 2 PUFFS BY MOUTH TWICE A DAY, Disp: 30.6 each, Rfl: 4 .  warfarin (COUMADIN) 4 MG tablet, Take one (1) of your blue, 4mg  strength warfarin tabletst on Sundays, Tuesdays, Thursdays and Saturdays. On Mondays, Wednesdays and Fridays, take only 1/2 tablet., Disp: 74 tablet, Rfl: 3 Past Medical History:  Diagnosis Date  . Arthritis   . Asthma   . Bronchitis   . CHF  (congestive heart failure) (HCC)   . Diabetes (HCC) 2019  . Diverticulitis   . DVT, lower extremity, recurrent (HCC)    Patient had unprovoked PE on 2002 and DVT in right lower extremety 2008.  2009 GERD (gastroesophageal reflux disease)   . Hematuria, microscopic 09/13/2017  . History of kidney stones   . Hypertension   . PE (pulmonary embolism)    Patient had unprovoked PE on 2002  . PONV (postoperative nausea and vomiting)   . Vertigo    Social History   Socioeconomic History  . Marital status: Divorced    Spouse name: Not on file  . Number of children: 5  . Years of education: 9 th grade  . Highest education level: 9th grade  Occupational History  . Occupation: retired  Tobacco Use  . Smoking status: Former Smoker    Types: Cigarettes    Quit date: 09/18/1988    Years since quitting: 31.7  . Smokeless tobacco: Never Used  . Tobacco comment: 6 pack year smoking history as a teen  Vaping Use  . Vaping Use: Never used  Substance and Sexual Activity  . Alcohol use: No  . Drug use: No  . Sexual activity: Not Currently    Birth control/protection: None  Other Topics Concern  . Not on file  Social History Narrative   Current Social History 08/28/2019   Patient lives with 80 yo granddaughter in a one story home. There are not steps up to the entrance the patient uses.       Patient's method of transportation is church member.      The highest level of education was 9th grade      The patient currently retired.      Identified important Relationships are God, family       Pets : 1 lab/pitt mix named Cinnamon       Interests / Fun: Church,TV       Current Stressors: "Me and my granddaughter"       Religious / Personal Beliefs: I'm Holiness and I love people"       Other: "I'd give away my last dime."       L. Ducatte, BSN, RN-BC    Social Determinants of Health   Financial Resource Strain: Not on file  Food Insecurity: Food Insecurity Present  . Worried About  22 in the Last Year: Sometimes true  . Ran Out of Food in the Last Year: Never true  Transportation Needs: Not on file  Physical Activity: Not on file  Stress: Not on file  Social Connections: Not on file   Family History  Problem Relation Age of Onset  . Cancer Mother   . Hypertension Sister   . Diabetes Sister   . Hypertension Brother   . Diabetes Brother   . Hypertension  Sister   . Diabetes Sister   . Colon cancer Other   . Heart Problems Other 34       sister's child, open heart surgery  . CVA Father   . Diabetes Son   . Hypertension Son   . Kidney disease Son        on dialysis  . Hypertension Sister   . Diabetes Sister   . Hypertension Sister   . Diabetes Sister   . Cancer Brother   . Hypertension Son   . Diabetes Son   . Hypertension Daughter   . Schizophrenia Daughter     ASSESSMENT Recent Results: The most recent result is correlated with 18 mg per week: Lab Results  Component Value Date   INR 2.0 05/25/2020   INR 2.5 04/13/2020   INR 2.4 03/23/2020    Anticoagulation Dosing: Description   Take one (1) of your 4 mg blue warfarin tablets on Mondays, Wednesdays and Fridays. All other days, take only one-half (1/2) of your 4mg  blue warfarin tablets.     INR today: Therapeutic  PLAN Weekly dose was increased by 10% to 20 mg per week  Patient Instructions  Patient instructed to take medications as defined in the Anti-coagulation Track section of this encounter.  Patient instructed to take today's dose.  Patient instructed to take  one (1) of your 4 mg blue warfarin tablets on Mondays, Wednesdays and Fridays. All other days, take only one-half (1/2) of your 4mg  blue warfarin tablets. Patient verbalized understanding of these instructions.    Patient advised to contact clinic or seek medical attention if signs/symptoms of bleeding or thromboembolism occur.  Patient verbalized understanding by repeating back information and was advised  to contact me if further medication-related questions arise. Patient was also provided an information handout.  Follow-up Return in 4 weeks (on 06/22/2020) for Follow up INR.  , PharmD, CPP  15 minutes spent face-to-face with the patient during the encounter. 50% of time spent on education, including signs/sx bleeding and clotting, as well as food and drug interactions with warfarin. 50% of time was spent on fingerprick POC INR sample collection,processing, results determination, and documentation in 06/24/2020.

## 2020-05-28 NOTE — Progress Notes (Signed)
INTERNAL MEDICINE TEACHING ATTENDING ADDENDUM - Morey Andonian M.D  Duration- indefinite, Indication- recurrent VTE, INR- therapeutic. Agree with pharmacy recommendations as outlined in their note.     

## 2020-05-30 ENCOUNTER — Other Ambulatory Visit: Payer: Self-pay | Admitting: Cardiology

## 2020-05-30 DIAGNOSIS — I10 Essential (primary) hypertension: Secondary | ICD-10-CM

## 2020-06-01 ENCOUNTER — Telehealth: Payer: Self-pay | Admitting: Cardiology

## 2020-06-01 DIAGNOSIS — I10 Essential (primary) hypertension: Secondary | ICD-10-CM

## 2020-06-01 MED ORDER — CARVEDILOL 12.5 MG PO TABS: 12.5000 mg | ORAL_TABLET | Freq: Two times a day (BID) | ORAL | 0 refills | Status: DC

## 2020-06-01 NOTE — Telephone Encounter (Signed)
Pt's medication was sent to pt's pharmacy as requested. Confirmation received.  °

## 2020-06-01 NOTE — Telephone Encounter (Signed)
*  STAT* If patient is at the pharmacy, call can be transferred to refill team.   1. Which medications need to be refilled? (please list name of each medication and dose if known) carvedilol (COREG) 12.5 MG tablet  2. Which pharmacy/location (including street and city if local pharmacy) is medication to be sent to? [id number] - [place], [place] - [address]  3. Do they need a 30 day or 90 day supply? 90 day supply   Pt has an upcoming appt on 09/22/20 @ 8:15am with Dr. Elberta Fortis

## 2020-06-04 ENCOUNTER — Telehealth: Payer: Self-pay | Admitting: Dietician

## 2020-06-04 NOTE — Telephone Encounter (Signed)
Caroline [name], St[place] wants to coe in on the same day she sess Dr. Alexandria Lodge. I am already booked fo June at that time. She is also agreeable to a telehealth or July.

## 2020-06-08 ENCOUNTER — Other Ambulatory Visit: Payer: Self-pay | Admitting: Internal Medicine

## 2020-06-08 DIAGNOSIS — R42 Dizziness and giddiness: Secondary | ICD-10-CM

## 2020-06-22 ENCOUNTER — Ambulatory Visit (INDEPENDENT_AMBULATORY_CARE_PROVIDER_SITE_OTHER): Payer: Medicare Other | Admitting: Pharmacist

## 2020-06-22 DIAGNOSIS — Z7901 Long term (current) use of anticoagulants: Secondary | ICD-10-CM

## 2020-06-22 DIAGNOSIS — I82402 Acute embolism and thrombosis of unspecified deep veins of left lower extremity: Secondary | ICD-10-CM | POA: Diagnosis not present

## 2020-06-22 LAB — POCT INR: INR: 2.6 (ref 2.0–3.0)

## 2020-06-22 NOTE — Progress Notes (Signed)
Anticoagulation Management Caroline [name] is a 75 y.o. female who reports to the clinic for monitoring of warfarin treatment.    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  Duration: indefinite Supervising physician:  Reymundo Poll, MD  Anticoagulation Clinic Visit History: Patient does not report signs/symptoms of bleeding or thromboembolism  Other recent changes: No diet, medications, lifestyle changes endorsed.  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] PULMONARY EMBOLISM HX OF (Resolved) [Z86.718]        Comments:           Allergies  Allergen Reactions   Penicillins Anaphylaxis, Hives, Swelling and Rash    Has patient had a PCN reaction causing immediate rash, facial/tongue/throat swelling, SOB or lightheadedness with hypotension: Yes Has patient had a PCN reaction causing severe rash involving mucus membranes or skin necrosis: Yes Has patient had a PCN reaction that required hospitalization Yes Has patient had a PCN reaction occurring within the last 10 years: No If all of the above answers are "NO", then may proceed with Cephalosporin use.    Cabbage Itching   Keflex [Cephalexin] Hives    And feeling of throat tightness   Shellfish Allergy Swelling   Tomato Swelling   Latex Itching and Rash    Current Outpatient Medications:    calcium citrate-vitamin D (CITRACAL+D) 315-200 MG-UNIT tablet, TAKE 2 TABLETS BY MOUTH EVERY DAY, Disp: 180 tablet, Rfl: 2   carvedilol (COREG) 12.5 MG tablet, Take 1 tablet (12.5 mg total) by mouth 2 (two) times  daily. Please keep upcoming appt in September 2022 with Dr. Elberta Fortis before anymore refills. Thank you Final Attempt, Disp: 180 tablet, Rfl: 0   EPINEPHrine 0.3 mg/0.3 mL IJ SOAJ injection, Inject 0.3 mg into the muscle as needed. Severe allergic reactions, Disp: , Rfl: 0   fluticasone (FLONASE) 50 MCG/ACT nasal spray, PLACE 2 SPRAYS DAILY INTO BOTH NOSTRILS., Disp: 48 mL, Rfl: 1   hydrocortisone (PROCTOSOL HC) 2.5 % rectal cream, Place 1 application rectally 2 (two) times daily., Disp: 30 g, Rfl: 0   losartan-hydrochlorothiazide (HYZAAR) 50-12.5 MG tablet, TAKE 1 TABLET BY MOUTH EVERY DAY, Disp: 90 tablet, Rfl: 3   meclizine (ANTIVERT) 25 MG tablet, TAKE 1 TABLET BY MOUTH THREE TIMES A DAY AS NEEDED, Disp: 90 tablet, Rfl: 5   metFORMIN (GLUCOPHAGE-XR) 500 MG 24 hr tablet, Take 1 tablet (500 mg total) by mouth 2 (two) times daily before a meal. Start with 500 mg 1*day for 1 week and then increase to 500 mg 2*day, Disp: 180 tablet, Rfl: 1   montelukast (SINGULAIR) 10 MG tablet, Take 1 tablet (10 mg total) by mouth at bedtime., Disp: 90 tablet, Rfl: 3   pantoprazole (PROTONIX) 40 MG tablet, TAKE 1 TABLET BY MOUTH EVERY DAY, Disp: 90 tablet, Rfl: 1   PROAIR HFA 108 (90 Base) MCG/ACT inhaler, TAKE 2 PUFFS BY MOUTH EVERY 6 HOURS AS NEEDED FOR WHEEZE OR SHORTNESS OF BREATH (Patient taking differently: Inhale 2 puffs into the lungs every 6 (six) hours  as needed for wheezing or shortness of breath.), Disp: 8.5 Inhaler, Rfl: 2   Psyllium (METAMUCIL PO), Take by mouth at bedtime. gummies, Disp: , Rfl:    RESTASIS 0.05 % ophthalmic emulsion, INSTILL 1 DROP INTO BOTH EYES TWICE A DAY, Disp: 60 mL, Rfl: 1   rosuvastatin (CRESTOR) 20 MG tablet, TAKE 1 TABLET BY MOUTH EVERY DAY, Disp: 90 tablet, Rfl: 3   SYMBICORT 160-4.5 MCG/ACT inhaler, TAKE 2 PUFFS BY MOUTH TWICE A DAY, Disp: 30.6 each, Rfl: 4   warfarin (COUMADIN) 4 MG tablet, Take one (1) of your blue, 4mg  strength warfarin tabletst on Sundays, Tuesdays,  Thursdays and Saturdays. On Mondays, Wednesdays and Fridays, take only 1/2 tablet., Disp: 74 tablet, Rfl: 3 Past Medical History:  Diagnosis Date   Arthritis    Asthma    Bronchitis    CHF (congestive heart failure) (HCC)    Diabetes (HCC) 2019   Diverticulitis    DVT, lower extremity, recurrent (HCC)    Patient had unprovoked PE on 2002 and DVT in right lower extremety 2008.   GERD (gastroesophageal reflux disease)    Hematuria, microscopic 09/13/2017   History of kidney stones    Hypertension    PE (pulmonary embolism)    Patient had unprovoked PE on 2002   PONV (postoperative nausea and vomiting)    Vertigo    Social History   Socioeconomic History   Marital status: Divorced    Spouse name: Not on file   Number of children: 5   Years of education: 9 th grade   Highest education level: 9th grade  Occupational History   Occupation: retired  Tobacco Use   Smoking status: Former    Pack years: 0.00    Types: Cigarettes    Quit date: 09/18/1988    Years since quitting: 31.7   Smokeless tobacco: Never   Tobacco comments:    6 pack year smoking history as a teen  Vaping Use   Vaping Use: Never used  Substance and Sexual Activity   Alcohol use: No   Drug use: No   Sexual activity: Not Currently    Birth control/protection: None  Other Topics Concern   Not on file  Social History Narrative   Current Social History 08/28/2019   Patient lives with 14 yo granddaughter in a one story home. There are not steps up to the entrance the patient uses.       Patient's method of transportation is church member.      The highest level of education was 9th grade      The patient currently retired.      Identified important Relationships are God, family       Pets : 1 lab/pitt mix named Cinnamon       Interests / Fun: Church,TV       Current Stressors: "Me and my granddaughter"       Religious / Personal Beliefs: I'm Holiness and I love people"       Other: "I'd give away my  last dime."       L. Ducatte, BSN, RN-BC    Social Determinants of Health   Financial Resource Strain: Not on file  Food Insecurity: Food Insecurity Present   Worried About 24 in the Last Year: Sometimes true   Programme researcher, broadcasting/film/video in the Last Year: Never true  Transportation Needs: Not on file  Physical Activity: Not on file  Stress: Not on file  Social Connections:  Not on file   Family History  Problem Relation Age of Onset   Cancer Mother    Hypertension Sister    Diabetes Sister    Hypertension Brother    Diabetes Brother    Hypertension Sister    Diabetes Sister    Colon cancer Other    Heart Problems Other 34       sister's child, open heart surgery   CVA Father    Diabetes Son    Hypertension Son    Kidney disease Son        on dialysis   Hypertension Sister    Diabetes Sister    Hypertension Sister    Diabetes Sister    Cancer Brother    Hypertension Son    Diabetes Son    Hypertension Daughter    Schizophrenia Daughter     ASSESSMENT Recent Results: The most recent result is correlated with 20 mg per week: Lab Results  Component Value Date   INR 2.6 06/22/2020   INR 2.0 05/25/2020   INR 2.5 04/13/2020    Anticoagulation Dosing: Description   Take one (1) of your 4 mg blue warfarin tablets on Mondays, Wednesdays and Fridays. All other days, take only one-half (1/2) of your 4mg  blue warfarin tablets.     INR today: Therapeutic  PLAN Weekly dose was unchanged.  Patient Instructions  Patient instructed to take medications as defined in the Anti-coagulation Track section of this encounter.  Patient instructed to take today's dose.  Patient instructed to take one (1) of your 4 mg blue warfarin tablets on Mondays, Wednesdays and Fridays. All other days, take only one-half (1/2) of your 4mg  blue warfarin tablets. Patient verbalized understanding of these instructions.   Patient advised to contact clinic or seek medical attention if  signs/symptoms of bleeding or thromboembolism occur.  Patient verbalized understanding by repeating back information and was advised to contact me if further medication-related questions arise. Patient was also provided an information handout.  Follow-up Return in 4 weeks (on 07/20/2020) for Follow up INR.  , PharmD, CPP  15 minutes spent face-to-face with the patient during the encounter. 50% of time spent on education, including signs/sx bleeding and clotting, as well as food and drug interactions with warfarin. 50% of time was spent on fingerprick POC INR sample collection,processing, results determination, and documentation in 09/20/2020.

## 2020-06-22 NOTE — Patient Instructions (Signed)
Patient instructed to take medications as defined in the Anti-coagulation Track section of this encounter.  Patient instructed to take today's dose.  Patient instructed to take one (1) of your 4 mg blue warfarin tablets on Mondays, Wednesdays and Fridays. All other days, take only one-half (1/2) of your 4mg  blue warfarin tablets. Patient verbalized understanding of these instructions.

## 2020-06-23 NOTE — Progress Notes (Signed)
INTERNAL MEDICINE TEACHING ATTENDING ADDENDUM   I agree with pharmacy recommendations as outlined in their note.   Isabel Freese, MD  

## 2020-07-02 ENCOUNTER — Other Ambulatory Visit: Payer: Self-pay | Admitting: Internal Medicine

## 2020-07-02 DIAGNOSIS — J301 Allergic rhinitis due to pollen: Secondary | ICD-10-CM

## 2020-07-07 ENCOUNTER — Ambulatory Visit (INDEPENDENT_AMBULATORY_CARE_PROVIDER_SITE_OTHER): Payer: Medicare Other | Admitting: Cardiology

## 2020-07-07 ENCOUNTER — Encounter: Payer: Self-pay | Admitting: Cardiology

## 2020-07-07 ENCOUNTER — Encounter (INDEPENDENT_AMBULATORY_CARE_PROVIDER_SITE_OTHER): Payer: Self-pay

## 2020-07-07 ENCOUNTER — Other Ambulatory Visit: Payer: Self-pay

## 2020-07-07 VITALS — BP 142/80 | HR 77 | Ht 61.0 in | Wt 266.2 lb

## 2020-07-07 DIAGNOSIS — I10 Essential (primary) hypertension: Secondary | ICD-10-CM

## 2020-07-07 MED ORDER — LOSARTAN POTASSIUM-HCTZ 100-25 MG PO TABS: 1.0000 | ORAL_TABLET | Freq: Every day | ORAL | 6 refills | Status: DC

## 2020-07-07 NOTE — Progress Notes (Signed)
Electrophysiology Office Note   Date:  07/07/2020   ID:  Caroline [name], DOB [birth year], MRN [id number]  PCP:  Earl Lagos, MD  Cardiologist:  Regan Lemming, MD    No chief complaint on file.    History of Present Illness: Caroline [name] is a 75 y.o. female who presents today for electrophysiology evaluation.     She has a history significant for pulmonary embolism, hypertension, systolic heart failure.  Today, denies symptoms of palpitations, chest pain, shortness of breath, orthopnea, PND, lower extremity edema, claudication, dizziness, presyncope, syncope, bleeding, or neurologic sequela. The patient is tolerating medications without difficulties.  Since being seen she has done well.  She has no chest pain or shortness of breath.  Is able do all of her daily activities without restriction.  Past Medical History:  Diagnosis Date   Arthritis    Asthma    Bronchitis    CHF (congestive heart failure) (HCC)    Diabetes (HCC) 2019   Diverticulitis    DVT, lower extremity, recurrent (HCC)    Patient had unprovoked PE on 2002 and DVT in right lower extremety 2008.   GERD (gastroesophageal reflux disease)    Hematuria, microscopic 09/13/2017   History of kidney stones    Hypertension    PE (pulmonary embolism)    Patient had unprovoked PE on 2002   PONV (postoperative nausea and vomiting)    Vertigo    Past Surgical History:  Procedure Laterality Date   ABDOMINAL HYSTERECTOMY     1980   CYSTOSCOPY W/ URETERAL STENT PLACEMENT Right 09/16/2015   Procedure: CYSTOSCOPY WITH RETROGRADE PYELOGRAM/URETERAL STENT PLACEMENT;  Surgeon: Alfredo Martinez, MD;  Location: [facility] OR;  Service: Urology;  Laterality: Right;   CYSTOSCOPY WITH RETROGRADE PYELOGRAM, URETEROSCOPY AND STENT PLACEMENT Right 12/11/2015   Procedure: RIGHT URETEROSCOPY/HOLMIUM LASER LITHOTRIPSY AND STONE REMOVAL removal and placement of double j stent;  Surgeon: Crist Fat, MD;  Location: [facility];  Service:  Urology;  Laterality: Right;   HOLMIUM LASER APPLICATION Right 12/11/2015   Procedure: HOLMIUM LASER APPLICATION;  Surgeon: Crist Fat, MD;  Location: [facility];  Service: Urology;  Laterality: Right;     Current Outpatient Medications  Medication Sig Dispense Refill   carvedilol (COREG) 12.5 MG tablet Take 1 tablet (12.5 mg total) by mouth 2 (two) times daily. Please keep upcoming appt in September 2022 with Dr. Elberta Fortis before anymore refills. Thank you Final Attempt 180 tablet 0   EPINEPHrine 0.3 mg/0.3 mL IJ SOAJ injection Inject 0.3 mg into the muscle as needed. Severe allergic reactions  0   fluticasone (FLONASE) 50 MCG/ACT nasal spray PLACE 2 SPRAYS DAILY INTO BOTH NOSTRILS. 48 mL 1   hydrocortisone (PROCTOSOL HC) 2.5 % rectal cream Place 1 application rectally 2 (two) times daily. 30 g 0   losartan-hydrochlorothiazide (HYZAAR) 50-12.5 MG tablet TAKE 1 TABLET BY MOUTH EVERY DAY 90 tablet 3   meclizine (ANTIVERT) 25 MG tablet TAKE 1 TABLET BY MOUTH THREE TIMES A DAY AS NEEDED 90 tablet 5   metFORMIN (GLUCOPHAGE-XR) 500 MG 24 hr tablet Take 1 tablet (500 mg total) by mouth 2 (two) times daily before a meal. Start with 500 mg 1*day for 1 week and then increase to 500 mg 2*day 180 tablet 1   montelukast (SINGULAIR) 10 MG tablet Take 1 tablet (10 mg total) by mouth at bedtime. 90 tablet 3   pantoprazole (PROTONIX) 40 MG tablet TAKE 1 TABLET BY MOUTH EVERY DAY 90 tablet 1  PROAIR HFA 108 (90 Base) MCG/ACT inhaler TAKE 2 PUFFS BY MOUTH EVERY 6 HOURS AS NEEDED FOR WHEEZE OR SHORTNESS OF BREATH (Patient taking differently: Inhale 2 puffs into the lungs every 6 (six) hours as needed for wheezing or shortness of breath.) 8.5 Inhaler 2   Psyllium (METAMUCIL PO) Take by mouth at bedtime. gummies     RESTASIS 0.05 % ophthalmic emulsion INSTILL 1 DROP INTO BOTH EYES TWICE A DAY 60 mL 1   rosuvastatin (CRESTOR) 20 MG tablet TAKE 1 TABLET BY MOUTH EVERY DAY 90 tablet 3   SYMBICORT 160-4.5 MCG/ACT  inhaler TAKE 2 PUFFS BY MOUTH TWICE A DAY 30.6 each 4   warfarin (COUMADIN) 4 MG tablet Take one (1) of your blue, 4mg  strength warfarin tabletst on Sundays, Tuesdays, Thursdays and Saturdays. On Mondays, Wednesdays and Fridays, take only 1/2 tablet. 74 tablet 3   No current facility-administered medications for this visit.    Allergies:   Penicillins, Cabbage, Keflex [cephalexin], Shellfish allergy, Tomato, and Latex   Social History:  The patient  reports that she quit smoking about 31 years ago. Her smoking use included cigarettes. She has never used smokeless tobacco. She reports that she does not drink alcohol and does not use drugs.   Family History:  The patient's family history includes CVA in her father; Cancer in her brother and mother; Colon cancer in an other family member; Diabetes in her brother, sister, sister, sister, sister, son, and son; Heart Problems (age of onset: 43) in an other family member; Hypertension in her brother, daughter, sister, sister, sister, sister, son, and son; Kidney disease in her son; Schizophrenia in her daughter.   ROS:  Please see the history of present illness.   Otherwise, review of systems is positive for none.   All other systems are reviewed and negative.   PHYSICAL EXAM: VS:  BP (!) 142/80   Pulse 77   Ht 5\' 1"  (1.549 m)   Wt 266 lb 3.2 oz (120.7 kg)   SpO2 98%   BMI 50.30 kg/m  , BMI Body mass index is 50.3 kg/m. GEN: Well nourished, well developed, in no acute distress  HEENT: normal  Neck: no JVD, carotid bruits, or masses Cardiac: RRR; no murmurs, rubs, or gallops,no edema  Respiratory:  clear to auscultation bilaterally, normal work of breathing GI: soft, nontender, nondistended, + BS MS: no deformity or atrophy  Skin: warm and dry Neuro:  Strength and sensation are intact Psych: euthymic mood, full affect  EKG:  EKG is ordered today. Personal review of the ekg ordered shows sinus rhythm, rate 77, RBBB  PAD Screen 10/28/2015   Previous PAD dx? No  Previous surgical procedure? Yes  Dates of procedures 09/16/15  Pain with walking? No  Feet/toe relief with dangling? No  Painful, non-healing ulcers? No  Extremities discolored? No      Recent Labs: 03/24/2020: BUN 11; Creatinine, Ser 0.88; Potassium 3.7; Sodium 142    Lipid Panel     Component Value Date/Time   CHOL 151 08/29/2017 1346   TRIG 112 08/29/2017 1346   HDL 47 08/29/2017 1346   CHOLHDL 3.2 08/29/2017 1346   CHOLHDL 4.9 04/08/2014 1353   VLDL 44 (H) 04/08/2014 1353   LDLCALC 82 08/29/2017 1346     Wt Readings from Last 3 Encounters:  07/07/20 266 lb 3.2 oz (120.7 kg)  03/24/20 285 lb (129.3 kg)  12/23/19 280 lb 1.6 oz (127.1 kg)      Other studies Reviewed: Additional  studies/ records that were reviewed today include: TTE 09/21/15  Review of the above records today demonstrates:  - Left ventricle: The cavity size was normal. Systolic function was   mildly reduced. The estimated ejection fraction was in the range   of 45% to 50%. Wall motion was normal; there were no regional   wall motion abnormalities. Features are consistent with a   pseudonormal left ventricular filling pattern, with concomitant   abnormal relaxation and increased filling pressure (grade 2   diastolic dysfunction). - Left atrium: The atrium was moderately dilated.  30 day monitor 09/06/16 - personally reviewed Sinus rhythm, no arrhythmias noted.  ASSESSMENT AND PLAN:  1.  Chronic combined systolic and diastolic heart failure: Currently on carvedilol and losartan.  No obvious volume overload.  No changes  2.  Hypertension: Blood pressure is elevated today.  We Kimberlie Csaszar double her Hyzaar.   Current medicines are reviewed at length with the patient today.   The patient does not have concerns regarding her medicines.  The following changes were made today: Increase Hyzaar  Labs/ tests ordered today include:  Orders Placed This Encounter  Procedures   EKG 12-Lead       Disposition:   FU with Manav Pierotti with 12 months  Signed, Quetzalli Clos Jorja Loa, MD  07/07/2020 2:45 PM     [facility] [address] [place] [address] [phone] (office) [phone] (fax)

## 2020-07-07 NOTE — Patient Instructions (Addendum)
Medication Instructions:  Your physician has recommended you make the following change in your medication:  INCREASE Hyzaar to 100/25 mg once daily  *If you need a refill on your cardiac medications before your next appointment, please call your pharmacy*   Lab Work: None ordered   Testing/Procedures: None ordered   Follow-Up: At [facility], you and your health needs are our priority.  As part of our continuing mission to provide you with exceptional heart care, we have created designated Provider Care Teams.  These Care Teams include your primary Cardiologist (physician) and Advanced Practice Providers (APPs -  Physician Assistants and Nurse Practitioners) who all work together to provide you with the care you need, when you need it.  We recommend signing up for the patient portal called "MyChart".  Sign up information is provided on this After Visit Summary.  MyChart is used to connect with patients for Virtual Visits (Telemedicine).  Patients are able to view lab/test results, encounter notes, upcoming appointments, etc.  Non-urgent messages can be sent to your provider as well.   To learn more about what you can do with MyChart, go to [email or web address].    Your next appointment:   1 year(s)  The format for your next appointment:   In Person  Provider:   Loman Brooklyn, MD    Thank you for choosing [facility]!!   Dory Horn, RN [phone]   Other Instructions

## 2020-07-20 ENCOUNTER — Ambulatory Visit (INDEPENDENT_AMBULATORY_CARE_PROVIDER_SITE_OTHER): Payer: Medicare Other | Admitting: Pharmacist

## 2020-07-20 DIAGNOSIS — I82402 Acute embolism and thrombosis of unspecified deep veins of left lower extremity: Secondary | ICD-10-CM | POA: Diagnosis not present

## 2020-07-20 DIAGNOSIS — Z7901 Long term (current) use of anticoagulants: Secondary | ICD-10-CM

## 2020-07-20 LAB — POCT INR: INR: 2.4 (ref 2.0–3.0)

## 2020-07-20 NOTE — Progress Notes (Signed)
Anticoagulation Management Caroline [name] is a 75 y.o. female who reports to the clinic for monitoring of warfarin treatment.    Indication: DVT, History of left lower extremity.  Duration: indefinite Supervising physician:  Charissa Bash, MD  Anticoagulation Clinic Visit History: Patient does not report signs/symptoms of bleeding or thromboembolism.  Other recent changes: No diet, medications, lifestyle changes cited by the patient.  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] PULMONARY EMBOLISM HX OF (Resolved) [Z86.718]        Comments:           Allergies  Allergen Reactions   Penicillins Anaphylaxis, Hives, Swelling and Rash    Has patient had a PCN reaction causing immediate rash, facial/tongue/throat swelling, SOB or lightheadedness with hypotension: Yes Has patient had a PCN reaction causing severe rash involving mucus membranes or skin necrosis: Yes Has patient had a PCN reaction that required hospitalization Yes Has patient had a PCN reaction occurring within the last 10 years: No If all of the above answers are "NO", then may proceed with Cephalosporin use.    Cabbage Itching   Keflex [Cephalexin] Hives    And feeling of throat tightness   Shellfish Allergy Swelling   Tomato Swelling   Latex Itching and Rash    Current Outpatient Medications:    carvedilol (COREG) 12.5 MG tablet, Take 1 tablet (12.5 mg total) by mouth 2 (two) times daily. Please keep upcoming appt in September 2022 with Dr. Elberta Fortis before anymore refills. Thank you Final Attempt, Disp: 180 tablet, Rfl: 0   EPINEPHrine 0.3 mg/0.3 mL IJ SOAJ injection, Inject 0.3 mg into  the muscle as needed. Severe allergic reactions, Disp: , Rfl: 0   fluticasone (FLONASE) 50 MCG/ACT nasal spray, PLACE 2 SPRAYS DAILY INTO BOTH NOSTRILS., Disp: 48 mL, Rfl: 1   hydrocortisone (PROCTOSOL HC) 2.5 % rectal cream, Place 1 application rectally 2 (two) times daily., Disp: 30 g, Rfl: 0   losartan-hydrochlorothiazide (HYZAAR) 100-25 MG tablet, Take 1 tablet by mouth daily., Disp: 30 tablet, Rfl: 6   meclizine (ANTIVERT) 25 MG tablet, TAKE 1 TABLET BY MOUTH THREE TIMES A DAY AS NEEDED, Disp: 90 tablet, Rfl: 5   metFORMIN (GLUCOPHAGE-XR) 500 MG 24 hr tablet, Take 1 tablet (500 mg total) by mouth 2 (two) times daily before a meal. Start with 500 mg 1*day for 1 week and then increase to 500 mg 2*day, Disp: 180 tablet, Rfl: 1   montelukast (SINGULAIR) 10 MG tablet, Take 1 tablet (10 mg total) by mouth at bedtime., Disp: 90 tablet, Rfl: 3   pantoprazole (PROTONIX) 40 MG tablet, TAKE 1 TABLET BY MOUTH EVERY DAY, Disp: 90 tablet, Rfl: 1   PROAIR HFA 108 (90 Base) MCG/ACT inhaler, TAKE 2 PUFFS BY MOUTH EVERY 6 HOURS AS NEEDED FOR WHEEZE OR SHORTNESS OF BREATH (Patient taking differently: Inhale 2 puffs into the lungs every 6 (six) hours as needed for wheezing or shortness of breath.), Disp: 8.5 Inhaler, Rfl: 2   Psyllium (METAMUCIL PO), Take by mouth at bedtime. gummies, Disp: , Rfl:    RESTASIS 0.05 % ophthalmic emulsion,  INSTILL 1 DROP INTO BOTH EYES TWICE A DAY, Disp: 60 mL, Rfl: 1   rosuvastatin (CRESTOR) 20 MG tablet, TAKE 1 TABLET BY MOUTH EVERY DAY, Disp: 90 tablet, Rfl: 3   SYMBICORT 160-4.5 MCG/ACT inhaler, TAKE 2 PUFFS BY MOUTH TWICE A DAY, Disp: 30.6 each, Rfl: 4   warfarin (COUMADIN) 4 MG tablet, Take one (1) of your blue, 4mg  strength warfarin tabletst on Sundays, Tuesdays, Thursdays and Saturdays. On Mondays, Wednesdays and Fridays, take only 1/2 tablet., Disp: 74 tablet, Rfl: 3 Past Medical History:  Diagnosis Date   Arthritis    Asthma    Bronchitis    CHF (congestive heart failure)  (HCC)    Diabetes (HCC) 2019   Diverticulitis    DVT, lower extremity, recurrent (HCC)    Patient had unprovoked PE on 2002 and DVT in right lower extremety 2008.   GERD (gastroesophageal reflux disease)    Hematuria, microscopic 09/13/2017   History of kidney stones    Hypertension    PE (pulmonary embolism)    Patient had unprovoked PE on 2002   PONV (postoperative nausea and vomiting)    Vertigo    Social History   Socioeconomic History   Marital status: Divorced    Spouse name: Not on file   Number of children: 5   Years of education: 9 th grade   Highest education level: 9th grade  Occupational History   Occupation: retired  Tobacco Use   Smoking status: Former    Pack years: 0.00    Types: Cigarettes    Quit date: 09/18/1988    Years since quitting: 31.8   Smokeless tobacco: Never   Tobacco comments:    6 pack year smoking history as a teen  Vaping Use   Vaping Use: Never used  Substance and Sexual Activity   Alcohol use: No   Drug use: No   Sexual activity: Not Currently    Birth control/protection: None  Other Topics Concern   Not on file  Social History Narrative   Current Social History 08/28/2019   Patient lives with 9 yo granddaughter in a one story home. There are not steps up to the entrance the patient uses.       Patient's method of transportation is church member.      The highest level of education was 9th grade      The patient currently retired.      Identified important Relationships are God, family       Pets : 1 lab/pitt mix named Cinnamon       Interests / Fun: Church,TV       Current Stressors: "Me and my granddaughter"       Religious / Personal Beliefs: I'm Holiness and I love people"       Other: "I'd give away my last dime."       L. [place]atte, BSN, RN-BC    Social Determinants of Health   Financial Resource Strain: Not on file  Food Insecurity: Food Insecurity Present   Worried About 24 in the Last Year:  Sometimes true   Programme researcher, broadcasting/film/video in the Last Year: Never true  Transportation Needs: Not on file  Physical Activity: Not on file  Stress: Not on file  Social Connections: Not on file   Family History  Problem Relation Age of Onset   Cancer Mother    Hypertension Sister    Diabetes Sister    Hypertension Brother  Diabetes Brother    Hypertension Sister    Diabetes Sister    Colon cancer Other    Heart Problems Other 34       sister's child, open heart surgery   CVA Father    Diabetes Son    Hypertension Son    Kidney disease Son        on dialysis   Hypertension Sister    Diabetes Sister    Hypertension Sister    Diabetes Sister    Cancer Brother    Hypertension Son    Diabetes Son    Hypertension Daughter    Schizophrenia Daughter     ASSESSMENT Recent Results: The most recent result is correlated with 20 mg per week: Lab Results  Component Value Date   INR 2.4 07/20/2020   INR 2.6 06/22/2020   INR 2.0 05/25/2020    Anticoagulation Dosing: Description   Take one (1) of your 4 mg blue warfarin tablets on Mondays, Wednesdays and Fridays. All other days, take only one-half (1/2) of your 4mg  blue warfarin tablets.     INR today: Therapeutic  PLAN Weekly dose was unchanged.    Patient Instructions  Patient instructed to take medications as defined in the Anti-coagulation Track section of this encounter.  Patient instructed to take today's dose.  Patient instructed to take one (1) of your 4 mg blue warfarin tablets on Mondays, Wednesndays and Fridays. All other days, take only one-half (1/2) of your 4mg  blue warfarin tablets. Patient verbalized understanding of these instructions.   Patient advised to contact clinic or seek medical attention if signs/symptoms of bleeding or thromboembolism occur.  Patient verbalized understanding by repeating back information and was advised to contact me if further medication-related questions arise. Patient was also  provided an information handout.  Follow-up Return in 4 weeks (on 08/17/2020) for Follow up INR.  , PharmD, CPP  15 minutes spent face-to-face with the patient during the encounter. 50% of time spent on education, including signs/sx bleeding and clotting, as well as food and drug interactions with warfarin. 50% of time was spent on fingerprick POC INR sample collection,processing, results determination, and documentation in 10/17/2020.

## 2020-07-20 NOTE — Patient Instructions (Signed)
Patient instructed to take medications as defined in the Anti-coagulation Track section of this encounter.  Patient instructed to take today's dose.  Patient instructed to take one (1) of your 4 mg blue warfarin tablets on Mondays, Wednesndays and Fridays. All other days, take only one-half (1/2) of your 4mg  blue warfarin tablets. Patient verbalized understanding of these instructions.

## 2020-07-27 NOTE — Progress Notes (Signed)
Dr. Groce's anticoagulation assessment and plan reviewed and appropriate.  

## 2020-07-28 ENCOUNTER — Encounter: Payer: Medicare Other | Admitting: Dietician

## 2020-07-28 ENCOUNTER — Ambulatory Visit (INDEPENDENT_AMBULATORY_CARE_PROVIDER_SITE_OTHER): Payer: Medicare Other | Admitting: Dietician

## 2020-07-28 ENCOUNTER — Encounter: Payer: Self-pay | Admitting: Dietician

## 2020-07-28 ENCOUNTER — Ambulatory Visit (INDEPENDENT_AMBULATORY_CARE_PROVIDER_SITE_OTHER): Payer: Medicare Other | Admitting: Internal Medicine

## 2020-07-28 DIAGNOSIS — E782 Mixed hyperlipidemia: Secondary | ICD-10-CM

## 2020-07-28 DIAGNOSIS — I10 Essential (primary) hypertension: Secondary | ICD-10-CM

## 2020-07-28 DIAGNOSIS — I5042 Chronic combined systolic (congestive) and diastolic (congestive) heart failure: Secondary | ICD-10-CM

## 2020-07-28 DIAGNOSIS — E118 Type 2 diabetes mellitus with unspecified complications: Secondary | ICD-10-CM

## 2020-07-28 DIAGNOSIS — R195 Other fecal abnormalities: Secondary | ICD-10-CM | POA: Insufficient documentation

## 2020-07-28 DIAGNOSIS — Z0000 Encounter for general adult medical examination without abnormal findings: Secondary | ICD-10-CM | POA: Diagnosis not present

## 2020-07-28 DIAGNOSIS — F329 Major depressive disorder, single episode, unspecified: Secondary | ICD-10-CM

## 2020-07-28 LAB — POCT GLYCOSYLATED HEMOGLOBIN (HGB A1C): Hemoglobin A1C: 6.6 % — AB (ref 4.0–5.6)

## 2020-07-28 LAB — GLUCOSE, CAPILLARY: Glucose-Capillary: 167 mg/dL — ABNORMAL HIGH (ref 70–99)

## 2020-07-28 MED ORDER — VITAMIN D 25 MCG (1000 UNIT) PO TABS: 1000.0000 [IU] | ORAL_TABLET | Freq: Every day | ORAL | 1 refills | Status: DC

## 2020-07-28 NOTE — Patient Instructions (Signed)
Thank you, Caroline [name] for allowing [place] to provide your care today. Today we discussed your colonoscopy, heart failure, blood pressure, diabetes, and cholesterol.   Colonoscopy - Your fecal occult blood test was positive. We have sent in a referral to [facility] so you can discuss your positive tests and the colonoscopy procedure. Please call them to schedule your appointment.  Heart Failure - Keep monitoring your legs and feet for swelling.  Blood pressure - Your blood pressure was high today. Please start taking the new dose of your Hyzar (Losartan-HCTZ) 100-25mg . We will check your kidney function and electrolytes at your next visit in a few weeks. Please continue taking carvedilol 12.5 mg as well.  Diabetes - Your A1c decreased to 6.6% today. Keep up the good work!  Cholesterol - We will get blood work to check your cholesterol at your next visit. Please continue the Crestor 20 mg.   I have ordered the following labs for you:   Lab Orders  Glucose, capillary  POC Hbg A1C     Referrals ordered today:   Referral Orders  Ambulatory referral to Gastroenterology  Mammogram     I have ordered the following medication/changed the following medications:   Start the following medications: Meds ordered this encounter  Medications   cholecalciferol (VITAMIN D3) 25 MCG (1000 UNIT) tablet    Sig: Take 1 tablet (1,000 Units total) by mouth daily.    Dispense:  90 tablet    Refill:  1     Follow up:  August 8     Remember: Call your eye doctor to schedule an exam today!  Should you have any questions or concerns please call the internal medicine clinic at [phone].     [facility]

## 2020-07-28 NOTE — Assessment & Plan Note (Addendum)
-  This problem is chronic and stable - Metformin was increased to 500 mg twice daily on last visit given elevated A1c of 7.7 -Diabetes control has improved with Metformin 500mg  BID as well as positive lifestyle modifications. A1c today of 6.6%. -Reminded patient to schedule eye exam, she stated she will call today to schedule.  -Given her history of HF she may benefit from an SGLT2 inhibitor but appears well controlled currently. Will hold off on changing meds for now - Patient following with diabetes and nutrition counseling with  Plan: Continue Metformin 500mg  BID. Re-check A1c in 6 months. F/u with ophthalmology.

## 2020-07-28 NOTE — Assessment & Plan Note (Addendum)
-   Patient had a positive FOBT test in September and was supposed to follow up with GI for a colonoscopy but has not done so -Had long discussion about the function of FOBT only as a screening test and the importance of colonoscopy after a positive FOBT. She is fairly resistant to obtaining a colonoscopy but says she will think about it.  -She agreed to a GI referral where they will discuss the procedure further, as well as other potential options.   Plan: Refer to GI for colonoscopy discussion. Follow-up at next visit.

## 2020-07-28 NOTE — Progress Notes (Signed)
   This is a Psychologist, occupational Note.  The care of the patient was discussed with Dr. Heide Spark and the assessment and plan was formulated with their assistance.  Please see their note for official documentation of the patient encounter.    Subjective:     Patient ID: Caroline [name], female   DOB: [birth year], 75 y.o.   MRN: [id number]  Caroline [name] is a 75 year old female with diabetes, hypertension, hyperlipidemia, and combined systolic and diastolic heart failure who presents for follow-up and health maintenance.   Caroline [name] states that she has been cutting back on sweets and sugary drinks and walking more to better control her blood sugar.   Her Losartan-HCTZ was recently increased to 100-25mg  by her cardiologist but she has not yet started this new dose because she wanted to check with [place] if it is okay. She denies any peripheral edema or shortness of breath.    We discussed the importance of getting a colonoscopy, especially due to her history of positive FOBT. She states she does not want to get a colonoscopy because she does not like being sedated and has felt nauseous when sedated in the past.   She requests a referral for a mammography.     Review of Systems  Constitutional:  Negative for chills, fever and unexpected weight change.  HENT:  Negative for congestion and sore throat.   Respiratory:  Negative for apnea and shortness of breath.   Cardiovascular:  Negative for chest pain and leg swelling.  Gastrointestinal:  Negative for abdominal pain, nausea and vomiting.  Endocrine: Negative for polydipsia and polyphagia.  Musculoskeletal:  Positive for arthralgias.  Neurological:  Negative for dizziness.      Objective:   Vitals:   07/28/20 1017  BP: (!) 160/83  Pulse: 82  Temp: 98.3 F (36.8 C)  SpO2: 98%    Physical Exam Constitutional:      Appearance: Normal appearance.  HENT:     Head: Normocephalic and atraumatic.     Nose: Nose normal.  Cardiovascular:     Rate and  Rhythm: Normal rate and regular rhythm.  Pulmonary:     Effort: Pulmonary effort is normal.     Breath sounds: Normal breath sounds.  Abdominal:     General: Abdomen is flat. Bowel sounds are normal.     Palpations: Abdomen is soft.  Musculoskeletal:        General: Normal range of motion.     Cervical back: Normal range of motion.     Comments: Ambulates with cane.  Skin:    General: Skin is warm and dry.  Neurological:     Mental Status: She is alert and oriented to person, place, and time.  Psychiatric:        Mood and Affect: Mood normal.        Behavior: Behavior normal.       Assessment:     75 year old female here for follow-up of chronic conditions.     Plan:     Please see problem-based charting for assessment and plan.

## 2020-07-28 NOTE — Patient Instructions (Addendum)
Fill half your plates with veggies at lunch and dinner.  2. Fill 1/4 your plate with protein. 3- Fill 1/4 your plate with whole grain starch  Please make a follow up in about 4 weeks.  [name] [phone]

## 2020-07-28 NOTE — Assessment & Plan Note (Addendum)
-  This problem is chronic and stable.  -No peripheral edema or fluid in lungs appreciated on physical exam.  -She denies any SOB and appears well controlled off loop diuretics - Will continue with carvedilol 12.5 mg twice daily -Cardiologist recently increased losartan-HCTZ to 100-25mg  from 50-12.5 mg but patient has not yet increased dose.   Plan: Start losartan-HCTZ 100-25mg , continue carvedilol. No further follow-up at this time.

## 2020-07-28 NOTE — Assessment & Plan Note (Addendum)
-  Patient is doing well and not requiring any medication currently. PHQ-9 of 0 today. -Patient reports good mood, enjoys spending time with 73 year old grandson. Youngest daughter (adopted granddaughter) resides at home with her.    Plan: Re-check in 1 year. Will continue to monitor off medication

## 2020-07-28 NOTE — Assessment & Plan Note (Addendum)
Vitals:   07/28/20 1017  BP: (!) 160/83  Pulse: 82   -BP elevated today. Repeat BP done in office was 147/67. -Losartan-HCTZ was increased to 100-25mg  by cardiologist but she has not begun this new dose yet. Encouraged her to start.   Plan: Increase Losartan-HCTZ to 100-25mg  from 50-12.5 mg. Continue Carvedilol 12.5mg . Will obtain BMP at next visit on August 8.

## 2020-07-28 NOTE — Progress Notes (Signed)
  Medical Nutrition Therapy:  Appt start time: 1015 end time:  1055 am. Total time:40 minutes Last visit- 12/2019  Assessment:  Primary concerns today: Ms. Fischl presents for follow up Medical Nutrition Therapy As referred by her doctor for increased A1c in March 2022. Her weight at that time was the highest it has been at 285#. Since then, it has dropped back ot her usual weight os 265-275#. She has restarted metformin for the past 2 months resulting in a A1C of  6.6%.  We continued our discussion about increasing her vegetable intake and having protein at all meals and snacks. She reports most drinking water now. Her vegetable intake is limited by her preferences and her coumadin restrictions.   Lab Results  Component Value Date   HGBA1C 6.6 (A) 07/28/2020   HGBA1C 7.7 (A) 03/24/2020   HGBA1C 6.2 (A) 10/01/2019   HGBA1C 5.5 03/19/2019   HGBA1C 6.1 (A) 03/26/2018    BP Readings from Last 3 Encounters:  07/28/20 (!) 160/83  07/07/20 (!) 142/80  03/24/20 (!) 149/66     Estimated body mass index is 51.96 kg/m as calculated from the following:   Height as of an earlier encounter on 07/28/20: 5\' 1"  (1.549 m).   Weight as of an earlier encounter on 07/28/20: 275 lb (124.7 kg).    Wt Readings from Last 10 Encounters:  07/28/20 275 lb (124.7 kg)  07/07/20 266 lb 3.2 oz (120.7 kg)  03/24/20 285 lb (129.3 kg)  12/23/19 280 lb 1.6 oz (127.1 kg)  11/25/19 277 lb 3.2 oz (125.7 kg)  10/29/19 275 lb 4.8 oz (124.9 kg)  10/01/19 275 lb 9.6 oz (125 kg)  10/01/19 275 lb 9.6 oz (125 kg)  03/19/19 257 lb 11.2 oz (116.9 kg)  03/05/19 256 lb (116.1 kg)    DIETARY INTAKE: Usual eating pattern includes 1- 2 meals and reports 1-2 snacks per day Greens, ham, 03/07/19, dressing, potato salad, ice cream, chocolate milk, juice,canned greens with [place] legs, peanut butter. Takes metamucil gummies 2-3 nightly to prevent constipation Breakfast- oatmeal or raison bran with Lactaid milk. Today had banana and  applesauce with her metformin Peanut butter sandwich on whole grain bread whenever she is hungry Greens once in a while, [place] chicken and salad using iceberg lettuce and hard boiled eggs Beverages- regular soda 1-2x/month, water Progress Towards Goal(s):  In progress.   Nutritional Diagnosis:  NB-1.4 Self-monitoring deficit As related to not weighing or knowing that she had gained weight is improving  As evidenced by her report of weighing and knowing she has increased weight. NI 1.5 Excessive energy intake as related to competing values and intake of large portions of high calorie foods as evidenced by her BMI of 51.96.     Intervention:  Nutrition reeducation about lower calorie foods and portion sizes, monitoring A1C, targets for A1C and what it means.  Action Goal: half a plate of veggies, 1/4 protein, 1/4 starch Outcome goal: improved knowledge about weight loss, lower calorie and higher nutrient dense food intake. Coordination of care: consider SGLT2i for CHF , weight and blood pressure  Teaching Method Utilized: Visual, Auditory,Hands on Handouts given during visit include: After visit summary Barriers to learning/adherence to lifestyle change: competing values Demonstrated degree of understanding via:  Teach Back   Monitoring/Evaluation:  Dietary intake, exercise, food records, and body weight in 4 week(s). [place], RD 07/28/2020 11:58 AM.

## 2020-07-28 NOTE — Assessment & Plan Note (Addendum)
-  This problem is chronic and stable. -Patient is due for an updated lipid panel, will get labs when she returns in 2 weeks for BMP. - Tolerating crestor well with no complaints   Plan: Obtain lipid panel at next visit. Continue Crestor 20mg .

## 2020-07-28 NOTE — Assessment & Plan Note (Signed)
Referral sent to GI to discuss colonoscopy due to positive FOBT (see problem).  Referral sent for mammography per patient request.  Patient reports difficulty affording vitamin d at [facility], GoodRx coupon given.

## 2020-07-30 ENCOUNTER — Ambulatory Visit
Admission: RE | Admit: 2020-07-30 | Discharge: 2020-07-30 | Disposition: A | Discharge status: Home / Self Care | Payer: Medicare Other | Source: Ambulatory Visit | Attending: Internal Medicine | Admitting: Internal Medicine

## 2020-07-30 ENCOUNTER — Other Ambulatory Visit: Payer: Self-pay

## 2020-07-30 ENCOUNTER — Other Ambulatory Visit: Payer: Self-pay | Admitting: Internal Medicine

## 2020-07-30 DIAGNOSIS — Z0000 Encounter for general adult medical examination without abnormal findings: Secondary | ICD-10-CM

## 2020-07-30 DIAGNOSIS — Z1231 Encounter for screening mammogram for malignant neoplasm of breast: Secondary | ICD-10-CM | POA: Diagnosis not present

## 2020-08-10 ENCOUNTER — Telehealth: Payer: Self-pay | Admitting: *Deleted

## 2020-08-10 NOTE — Telephone Encounter (Signed)
  Received: 1 week ago Delos Haring, Mordecai Maes, [place]; [place], [place]; Colletta [place]; [place], Reynold Bowen, RN  Left Braylei a VM. Informed her she can come into our retail location to pick this item up.    ----- Message -----  From: Rudene Re, [place]  Sent: 07/29/2020   1:57 PM EDT  To: Henderson Newcomer, Judie Petit, *  Subject: DME Order                                       Dme order in for this payient for Elevated [place] seat can you help [place] withthis [place], [place] C7/20/20221:57 PM

## 2020-08-13 ENCOUNTER — Telehealth: Payer: Self-pay | Admitting: Dietician

## 2020-08-13 NOTE — Telephone Encounter (Signed)
Spoke to patient who says she has an appointment at the [facility] on Aug 13 at [place] on [address]. [name]

## 2020-08-14 ENCOUNTER — Other Ambulatory Visit: Payer: Self-pay | Admitting: Internal Medicine

## 2020-08-17 ENCOUNTER — Ambulatory Visit (INDEPENDENT_AMBULATORY_CARE_PROVIDER_SITE_OTHER): Payer: Medicare Other | Admitting: Pharmacist

## 2020-08-17 VITALS — BP 135/69 | HR 72

## 2020-08-17 DIAGNOSIS — Z7901 Long term (current) use of anticoagulants: Secondary | ICD-10-CM | POA: Diagnosis not present

## 2020-08-17 DIAGNOSIS — I10 Essential (primary) hypertension: Secondary | ICD-10-CM

## 2020-08-17 DIAGNOSIS — I82402 Acute embolism and thrombosis of unspecified deep veins of left lower extremity: Secondary | ICD-10-CM | POA: Diagnosis not present

## 2020-08-17 DIAGNOSIS — E782 Mixed hyperlipidemia: Secondary | ICD-10-CM

## 2020-08-17 LAB — POCT INR: INR: 2.1 (ref 2.0–3.0)

## 2020-08-17 NOTE — Patient Instructions (Signed)
Patient instructed to take medications as defined in the Anti-coagulation Track section of this encounter.  Patient instructed to take today's dose.  Patient instructed to take one (1) of your 4 mg blue warfarin tablets on Mondays, Wednesdays, Fridays and Saturdays. All other days, take only one-half (1/2) of your 4mg  blue warfarin tablets. Patient verbalized understanding of these instructions.

## 2020-08-17 NOTE — Progress Notes (Signed)
Pt here for anticoagulation appt with Dr Alexandria Lodge States pcp had wanted her to obtain  BP check and labs when she came in PCP not avail-discussed with attending MD BP 135/69  Pulse 72  Pt tolerating medication well   BMP and Lipid were obtained

## 2020-08-17 NOTE — Progress Notes (Signed)
Patient here for INR and coumadin clinic appointment. There was some confusion regarding the plan for BP and lab follow up. She was told she would have blood work and BP rechecked today. I reviewed the prior office visit note. I have ordered BMP and lipid panel. We went ahead and rechecked her BP today which was improved to 135/69 compared to 160/83 at her last visit. She was instructed to continue increased dose of losartan-hctz. She will need to schedule PCP follow up in 6 months.

## 2020-08-17 NOTE — Progress Notes (Signed)
Anticoagulation Management Caroline [name] is a 75 y.o. female who reports to the clinic for monitoring of warfarin treatment.    Indication: DVT , history of left lower extremity; long term current use of oral anticoagulant warfarin to maintain INR 2.0 - 3.0.    Duration: indefinite Supervising physician:  Reymundo Poll, MD  Anticoagulation Clinic Visit History: Patient does not report signs/symptoms of bleeding or thromboembolism  Other recent changes: No diet, medications, lifestyle changes endorsed. 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] PULMONARY EMBOLISM HX OF (Resolved) [Z86.718]        Comments:           Allergies  Allergen Reactions   Penicillins Anaphylaxis, Hives, Swelling and Rash    Has patient had a PCN reaction causing immediate rash, facial/tongue/throat swelling, SOB or lightheadedness with hypotension: Yes Has patient had a PCN reaction causing severe rash involving mucus membranes or skin necrosis: Yes Has patient had a PCN reaction that required hospitalization Yes Has patient had a PCN reaction occurring within the last 10 years: No If all of the above answers are "NO", then may proceed with Cephalosporin use.    Cabbage Itching   Keflex [Cephalexin] Hives    And feeling of throat tightness   Shellfish Allergy Swelling   Tomato Swelling   Latex Itching and Rash    Current Outpatient Medications:    carvedilol (COREG) 12.5 MG tablet, Take 1 tablet (12.5 mg total) by mouth 2 (two) times daily. Please keep upcoming appt in September 2022 with Dr. Elberta Fortis before anymore refills. Thank you Final Attempt, Disp: 180 tablet,  Rfl: 0   cholecalciferol (VITAMIN D3) 25 MCG (1000 UNIT) tablet, Take 1 tablet (1,000 Units total) by mouth daily., Disp: 90 tablet, Rfl: 1   EPINEPHrine 0.3 mg/0.3 mL IJ SOAJ injection, Inject 0.3 mg into the muscle as needed. Severe allergic reactions, Disp: , Rfl: 0   fluticasone (FLONASE) 50 MCG/ACT nasal spray, PLACE 2 SPRAYS DAILY INTO BOTH NOSTRILS., Disp: 48 mL, Rfl: 1   hydrocortisone (PROCTOSOL HC) 2.5 % rectal cream, Place 1 application rectally 2 (two) times daily., Disp: 30 g, Rfl: 0   losartan-hydrochlorothiazide (HYZAAR) 100-25 MG tablet, Take 1 tablet by mouth daily., Disp: 30 tablet, Rfl: 6   meclizine (ANTIVERT) 25 MG tablet, TAKE 1 TABLET BY MOUTH THREE TIMES A DAY AS NEEDED, Disp: 90 tablet, Rfl: 5   metFORMIN (GLUCOPHAGE-XR) 500 MG 24 hr tablet, Take 1 tablet (500 mg total) by mouth 2 (two) times daily before a meal. Start with 500 mg 1*day for 1 week and then increase to 500 mg 2*day, Disp: 180 tablet, Rfl: 1   montelukast (SINGULAIR) 10 MG tablet, Take 1 tablet (10 mg total) by mouth at bedtime., Disp: 90 tablet, Rfl: 3   pantoprazole (PROTONIX) 40 MG tablet, TAKE 1 TABLET BY MOUTH EVERY DAY, Disp: 90 tablet, Rfl: 1   PROAIR HFA 108 (90 Base) MCG/ACT inhaler, TAKE 2 PUFFS BY MOUTH EVERY 6 HOURS AS NEEDED FOR WHEEZE OR SHORTNESS OF BREATH (Patient taking differently: Inhale 2 puffs into the lungs every 6 (  six) hours as needed for wheezing or shortness of breath.), Disp: 8.5 Inhaler, Rfl: 2   Psyllium (METAMUCIL PO), Take by mouth at bedtime. gummies, Disp: , Rfl:    RESTASIS 0.05 % ophthalmic emulsion, INSTILL 1 DROP INTO BOTH EYES TWICE A DAY, Disp: 60 mL, Rfl: 1   rosuvastatin (CRESTOR) 20 MG tablet, TAKE 1 TABLET BY MOUTH EVERY DAY, Disp: 90 tablet, Rfl: 3   SYMBICORT 160-4.5 MCG/ACT inhaler, TAKE 2 PUFFS BY MOUTH TWICE A DAY, Disp: 30.6 each, Rfl: 4   warfarin (COUMADIN) 4 MG tablet, Take one (1) of your blue, 4mg  strength warfarin tabletst on Sundays, Tuesdays, Thursdays and  Saturdays. On Mondays, Wednesdays and Fridays, take only 1/2 tablet., Disp: 74 tablet, Rfl: 3 Past Medical History:  Diagnosis Date   Arthritis    Asthma    Bronchitis    CHF (congestive heart failure) (HCC)    Diabetes (HCC) 2019   Diverticulitis    DVT, lower extremity, recurrent (HCC)    Patient had unprovoked PE on 2002 and DVT in right lower extremety 2008.   GERD (gastroesophageal reflux disease)    Hematuria, microscopic 09/13/2017   History of kidney stones    Hypertension    PE (pulmonary embolism)    Patient had unprovoked PE on 2002   PONV (postoperative nausea and vomiting)    Vertigo    Social History   Socioeconomic History   Marital status: Divorced    Spouse name: Not on file   Number of children: 5   Years of education: 9 th grade   Highest education level: 9th grade  Occupational History   Occupation: retired  Tobacco Use   Smoking status: Former    Types: Cigarettes    Quit date: 09/18/1988    Years since quitting: 31.9   Smokeless tobacco: Never   Tobacco comments:    6 pack year smoking history as a teen  Vaping Use   Vaping Use: Never used  Substance and Sexual Activity   Alcohol use: No   Drug use: No   Sexual activity: Not Currently    Birth control/protection: None  Other Topics Concern   Not on file  Social History Narrative   Current Social History 08/28/2019   Patient lives with 60 yo granddaughter in a one story home. There are not steps up to the entrance the patient uses.       Patient's method of transportation is church member.      The highest level of education was 9th grade      The patient currently retired.      Identified important Relationships are God, family       Pets : 1 lab/pitt mix named Cinnamon       Interests / Fun: Church,TV       Current Stressors: "Me and my granddaughter"       Religious / Personal Beliefs: I'm Holiness and I love people"       Other: "I'd give away my last dime."       L. Ducatte,  BSN, RN-BC    Social Determinants of Health   Financial Resource Strain: Not on file  Food Insecurity: Food Insecurity Present   Worried About 22 in the Last Year: Sometimes true   Programme researcher, broadcasting/film/video in the Last Year: Never true  Transportation Needs: Not on file  Physical Activity: Not on file  Stress: Not on file  Social Connections: Not on file  Family History  Problem Relation Age of Onset   Cancer Mother    Hypertension Sister    Diabetes Sister    Hypertension Brother    Diabetes Brother    Hypertension Sister    Diabetes Sister    Colon cancer Other    Heart Problems Other 34       sister's child, open heart surgery   CVA Father    Diabetes Son    Hypertension Son    Kidney disease Son        on dialysis   Hypertension Sister    Diabetes Sister    Hypertension Sister    Diabetes Sister    Cancer Brother    Hypertension Son    Diabetes Son    Hypertension Daughter    Schizophrenia Daughter     ASSESSMENT Recent Results: The most recent result is correlated with 20 mg per week: Lab Results  Component Value Date   INR 2.1 08/17/2020   INR 2.4 07/20/2020   INR 2.6 06/22/2020    Anticoagulation Dosing: Description   Take one (1) of your 4 mg blue warfarin tablets on Mondays, Wednesdays, Fridays and Saturdays. All other days, take only one-half (1/2) of your 4mg  blue warfarin tablets.     INR today: Therapeutic  PLAN Weekly dose was increased by 10% to 22 mg per week  Patient Instructions  Patient instructed to take medications as defined in the Anti-coagulation Track section of this encounter.  Patient instructed to take today's dose.  Patient instructed to take one (1) of your 4 mg blue warfarin tablets on Mondays, Wednesdays, Fridays and Saturdays. All other days, take only one-half (1/2) of your 4mg  blue warfarin tablets. Patient verbalized understanding of these instructions.   Patient advised to contact clinic or seek medical  attention if signs/symptoms of bleeding or thromboembolism occur.  Patient verbalized understanding by repeating back information and was advised to contact me if further medication-related questions arise. Patient was also provided an information handout.  Follow-up Return in 5 weeks (on 09/21/2020) for Follow up INR.  , PharmD, CPP  15 minutes spent face-to-face with the patient during the encounter. 50% of time spent on education, including signs/sx bleeding and clotting, as well as food and drug interactions with warfarin. 50% of time was spent on fingerprick POC INR sample collection,processing, results determination, and documentation in 11/21/2020.

## 2020-08-18 ENCOUNTER — Other Ambulatory Visit: Payer: Self-pay | Admitting: *Deleted

## 2020-08-18 DIAGNOSIS — I10 Essential (primary) hypertension: Secondary | ICD-10-CM

## 2020-08-18 LAB — LIPID PANEL
Chol/HDL Ratio: 3 ratio (ref 0.0–4.4)
Cholesterol, Total: 149 mg/dL (ref 100–199)
HDL: 49 mg/dL (ref >39)
LDL Chol Calc (NIH): 82 mg/dL (ref 0–99)
Triglycerides: 96 mg/dL (ref 0–149)
VLDL Cholesterol Cal: 18 mg/dL (ref 5–40)

## 2020-08-18 LAB — BMP8+ANION GAP
Anion Gap: 16 mmol/L (ref 10.0–18.0)
BUN/Creatinine Ratio: 23 (ref 12–28)
BUN: 19 mg/dL (ref 8–27)
CO2: 25 mmol/L (ref 20–29)
Calcium: 9.4 mg/dL (ref 8.7–10.3)
Chloride: 102 mmol/L (ref 96–106)
Creatinine, Ser: 0.82 mg/dL (ref 0.57–1.00)
Glucose: 166 mg/dL — ABNORMAL HIGH (ref 65–99)
Potassium: 3.9 mmol/L (ref 3.5–5.2)
Sodium: 143 mmol/L (ref 134–144)
eGFR: 75 mL/min/{1.73_m2} (ref >59)

## 2020-08-18 MED ORDER — CARVEDILOL 12.5 MG PO TABS: 12.5000 mg | ORAL_TABLET | Freq: Two times a day (BID) | ORAL | 1 refills | Status: DC

## 2020-08-18 NOTE — Telephone Encounter (Signed)
-----   Message from Reymundo Poll, MD sent at 08/18/2020  9:20 AM EDT ----- Can you please let patient know that her labs look good. Her kidney function is normal and her cholesterol levels are at goal on her current Crestor medication. Continue taking all medications as previously prescribed and schedule follow up with her PCP 6 months from her last appointment on 07/28/20.

## 2020-08-18 NOTE — Telephone Encounter (Signed)
Pt called and informed of Dr Jule Ser message of lab results, continuing current meds. And she stated she will call back to schedule a 6 month f/u appt with her PCP. Stated she needs a refill on Carvedilol.

## 2020-08-18 NOTE — Progress Notes (Signed)
INTERNAL MEDICINE TEACHING ATTENDING ADDENDUM   I agree with pharmacy recommendations as outlined in their note.   Dominik Yordy, MD  

## 2020-08-26 ENCOUNTER — Other Ambulatory Visit: Payer: Self-pay | Admitting: Internal Medicine

## 2020-08-26 DIAGNOSIS — J4521 Mild intermittent asthma with (acute) exacerbation: Secondary | ICD-10-CM

## 2020-08-26 DIAGNOSIS — K219 Gastro-esophageal reflux disease without esophagitis: Secondary | ICD-10-CM

## 2020-08-26 DIAGNOSIS — E118 Type 2 diabetes mellitus with unspecified complications: Secondary | ICD-10-CM

## 2020-08-26 LAB — HM DIABETES EYE EXAM

## 2020-09-18 ENCOUNTER — Telehealth: Payer: Self-pay

## 2020-09-18 NOTE — Telephone Encounter (Signed)
Patient is due for AWV, attempted to call patient no answer or voice mail to leave a message.

## 2020-09-21 ENCOUNTER — Ambulatory Visit (INDEPENDENT_AMBULATORY_CARE_PROVIDER_SITE_OTHER): Payer: Medicare Other | Admitting: Pharmacist

## 2020-09-21 DIAGNOSIS — Z23 Encounter for immunization: Secondary | ICD-10-CM | POA: Diagnosis not present

## 2020-09-22 ENCOUNTER — Ambulatory Visit: Payer: Medicare Other | Admitting: Cardiology

## 2020-10-16 ENCOUNTER — Other Ambulatory Visit: Payer: Self-pay | Admitting: Internal Medicine

## 2020-10-16 DIAGNOSIS — J301 Allergic rhinitis due to pollen: Secondary | ICD-10-CM

## 2020-10-16 DIAGNOSIS — J4521 Mild intermittent asthma with (acute) exacerbation: Secondary | ICD-10-CM

## 2020-10-19 ENCOUNTER — Ambulatory Visit (INDEPENDENT_AMBULATORY_CARE_PROVIDER_SITE_OTHER): Payer: Medicare Other | Admitting: Pharmacist

## 2020-10-19 DIAGNOSIS — I82402 Acute embolism and thrombosis of unspecified deep veins of left lower extremity: Secondary | ICD-10-CM

## 2020-10-19 DIAGNOSIS — Z7901 Long term (current) use of anticoagulants: Secondary | ICD-10-CM

## 2020-10-19 LAB — POCT INR: INR: 2.9 (ref 2.0–3.0)

## 2020-10-19 NOTE — Progress Notes (Signed)
INTERNAL MEDICINE TEACHING ATTENDING ADDENDUM - Dynesha Woolen M.D  Duration- indefinite, Indication- recurrent VTE, INR- therapeutic. Agree with pharmacy recommendations as outlined in their note.     

## 2020-10-19 NOTE — Patient Instructions (Signed)
Patient instructed to take medications as defined in the Anti-coagulation Track section of this encounter.  Patient instructed to take today's dose.  Patient instructed to take one (1) of your 4 mg blue warfarin tablets on Mondays, Wednesdays,  and Fridays. All other days, take only one-half (1/2) of your 4mg  blue warfarin tablets. Patient verbalized understanding of these instructions.

## 2020-10-19 NOTE — Progress Notes (Signed)
Anticoagulation Management Caroline [name] is a 75 y.o. female who reports to the clinic for monitoring of warfarin treatment.    Indication: DVT , History of, left lower extremity.  Duration: indefinite Supervising physician: Earl Lagos  Anticoagulation Clinic Visit History: Patient does not report signs/symptoms of bleeding or thromboembolism  Other recent changes: No diet, medications, lifestyle changes endorsed by the patient at this visit.  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] PULMONARY EMBOLISM HX OF (Resolved) [Z86.718]        Comments:           Allergies  Allergen Reactions   Penicillins Anaphylaxis, Hives, Swelling and Rash    Has patient had a PCN reaction causing immediate rash, facial/tongue/throat swelling, SOB or lightheadedness with hypotension: Yes Has patient had a PCN reaction causing severe rash involving mucus membranes or skin necrosis: Yes Has patient had a PCN reaction that required hospitalization Yes Has patient had a PCN reaction occurring within the last 10 years: No If all of the above answers are "NO", then may proceed with Cephalosporin use.    Cabbage Itching   Keflex [Cephalexin] Hives    And feeling of throat tightness   Shellfish Allergy Swelling   Tomato Swelling   Latex Itching and Rash    Current Outpatient Medications:    carvedilol (COREG) 12.5 MG tablet, Take 1 tablet (12.5 mg total) by mouth 2 (two) times daily. Please keep upcoming appt in September 2022 with Dr. Elberta Fortis before anymore refills. Thank you Final Attempt, Disp: 180 tablet, Rfl: 1   cholecalciferol (VITAMIN D3) 25 MCG (1000 UNIT)  tablet, Take 1 tablet (1,000 Units total) by mouth daily., Disp: 90 tablet, Rfl: 1   EPINEPHrine 0.3 mg/0.3 mL IJ SOAJ injection, Inject 0.3 mg into the muscle as needed. Severe allergic reactions, Disp: , Rfl: 0   fluticasone (FLONASE) 50 MCG/ACT nasal spray, PLACE 2 SPRAYS DAILY INTO BOTH NOSTRILS., Disp: 48 mL, Rfl: 1   hydrocortisone (PROCTOSOL HC) 2.5 % rectal cream, Place 1 application rectally 2 (two) times daily., Disp: 30 g, Rfl: 0   losartan-hydrochlorothiazide (HYZAAR) 100-25 MG tablet, Take 1 tablet by mouth daily., Disp: 30 tablet, Rfl: 6   meclizine (ANTIVERT) 25 MG tablet, TAKE 1 TABLET BY MOUTH THREE TIMES A DAY AS NEEDED, Disp: 90 tablet, Rfl: 5   metFORMIN (GLUCOPHAGE-XR) 500 MG 24 hr tablet, START WITH 500 MG 1*DAY FOR 1 WEEK AND THEN INCREASE TO 500 MG 2*DAY, Disp: 180 tablet, Rfl: 1   montelukast (SINGULAIR) 10 MG tablet, TAKE 1 TABLET BY MOUTH EVERYDAY AT BEDTIME, Disp: 90 tablet, Rfl: 3   pantoprazole (PROTONIX) 40 MG tablet, TAKE 1 TABLET BY MOUTH EVERY DAY, Disp: 90 tablet, Rfl: 1   PROAIR HFA 108 (90 Base) MCG/ACT inhaler, TAKE 2 PUFFS BY MOUTH EVERY 6 HOURS AS NEEDED FOR WHEEZE OR SHORTNESS OF BREATH (Patient taking differently: Inhale 2 puffs into the lungs every 6 (six) hours as needed for wheezing or shortness of breath.), Disp: 8.5 Inhaler, Rfl: 2   Psyllium (METAMUCIL PO), Take by mouth at bedtime. gummies, Disp: ,  Rfl:    RESTASIS 0.05 % ophthalmic emulsion, INSTILL 1 DROP INTO BOTH EYES TWICE A DAY, Disp: 60 mL, Rfl: 1   rosuvastatin (CRESTOR) 20 MG tablet, TAKE 1 TABLET BY MOUTH EVERY DAY, Disp: 90 tablet, Rfl: 3   SYMBICORT 160-4.5 MCG/ACT inhaler, TAKE 2 PUFFS BY MOUTH TWICE A DAY, Disp: 30.6 each, Rfl: 4   warfarin (COUMADIN) 4 MG tablet, Take one (1) of your blue, 4mg  strength warfarin tabletst on Sundays, Tuesdays, Thursdays and Saturdays. On Mondays, Wednesdays and Fridays, take only 1/2 tablet., Disp: 74 tablet, Rfl: 3 Past Medical History:  Diagnosis Date    Arthritis    Asthma    Bronchitis    CHF (congestive heart failure) (HCC)    Diabetes (HCC) 2019   Diverticulitis    DVT, lower extremity, recurrent (HCC)    Patient had unprovoked PE on 2002 and DVT in right lower extremety 2008.   GERD (gastroesophageal reflux disease)    Hematuria, microscopic 09/13/2017   History of kidney stones    Hypertension    PE (pulmonary embolism)    Patient had unprovoked PE on 2002   PONV (postoperative nausea and vomiting)    Vertigo    Social History   Socioeconomic History   Marital status: Divorced    Spouse name: Not on file   Number of children: 5   Years of education: 9 th grade   Highest education level: 9th grade  Occupational History   Occupation: retired  Tobacco Use   Smoking status: Former    Types: Cigarettes    Quit date: 09/18/1988    Years since quitting: 32.1   Smokeless tobacco: Never   Tobacco comments:    6 pack year smoking history as a teen  Vaping Use   Vaping Use: Never used  Substance and Sexual Activity   Alcohol use: No   Drug use: No   Sexual activity: Not Currently    Birth control/protection: None  Other Topics Concern   Not on file  Social History Narrative   Current Social History 08/28/2019   Patient lives with 31 yo granddaughter in a one story home. There are not steps up to the entrance the patient uses.       Patient's method of transportation is church member.      The highest level of education was 9th grade      The patient currently retired.      Identified important Relationships are God, family       Pets : 1 lab/pitt mix named Cinnamon       Interests / Fun: Church,TV       Current Stressors: "Me and my granddaughter"       Religious / Personal Beliefs: I'm Holiness and I love people"       Other: "I'd give away my last dime."       L. Ducatte, BSN, RN-BC    Social Determinants of Health   Financial Resource Strain: Not on file  Food Insecurity: Food Insecurity Present    Worried About 22 in the Last Year: Sometimes true   Programme researcher, broadcasting/film/video in the Last Year: Never true  Transportation Needs: Not on file  Physical Activity: Not on file  Stress: Not on file  Social Connections: Not on file   Family History  Problem Relation Age of Onset   Cancer Mother    Hypertension Sister    Diabetes Sister    Hypertension Brother  Diabetes Brother    Hypertension Sister    Diabetes Sister    Colon cancer Other    Heart Problems Other 34       sister's child, open heart surgery   CVA Father    Diabetes Son    Hypertension Son    Kidney disease Son        on dialysis   Hypertension Sister    Diabetes Sister    Hypertension Sister    Diabetes Sister    Cancer Brother    Hypertension Son    Diabetes Son    Hypertension Daughter    Schizophrenia Daughter     ASSESSMENT Recent Results: The most recent result is correlated with 22 mg per week: Lab Results  Component Value Date   INR 2.9 10/19/2020   INR 2.1 08/17/2020   INR 2.4 07/20/2020    Anticoagulation Dosing: Description   Take one (1) of your 4 mg blue warfarin tablets on Mondays, Wednesdays,  and Fridays. All other days, take only one-half (1/2) of your 4mg  blue warfarin tablets.     INR today: Therapeutic  PLAN Weekly dose was decreased by 10% to 20 mg per week  Patient Instructions  Patient instructed to take medications as defined in the Anti-coagulation Track section of this encounter.  Patient instructed to take today's dose.  Patient instructed to take one (1) of your 4 mg blue warfarin tablets on Mondays, Wednesdays,  and Fridays. All other days, take only one-half (1/2) of your 4mg  blue warfarin tablets. Patient verbalized understanding of these instructions.   Patient advised to contact clinic or seek medical attention if signs/symptoms of bleeding or thromboembolism occur.  Patient verbalized understanding by repeating back information and was advised to  contact me if further medication-related questions arise. Patient was also provided an information handout.  Follow-up Return in 4 weeks (on 11/16/2020) for Follow up INR.  , PharmD, CPP  15 minutes spent face-to-face with the patient during the encounter. 50% of time spent on education, including signs/sx bleeding and clotting, as well as food and drug interactions with warfarin. 50% of time was spent on fingerprick POC INR sample collection,processing, results determination, and documentation in 13/07/2020.

## 2020-11-02 ENCOUNTER — Encounter: Payer: Self-pay | Admitting: Dietician

## 2020-11-16 ENCOUNTER — Ambulatory Visit (INDEPENDENT_AMBULATORY_CARE_PROVIDER_SITE_OTHER): Payer: Medicare Other | Admitting: Pharmacist

## 2020-11-16 DIAGNOSIS — I82409 Acute embolism and thrombosis of unspecified deep veins of unspecified lower extremity: Secondary | ICD-10-CM

## 2020-11-16 DIAGNOSIS — Z7901 Long term (current) use of anticoagulants: Secondary | ICD-10-CM | POA: Diagnosis not present

## 2020-11-16 LAB — POCT INR: INR: 2.8 (ref 2.0–3.0)

## 2020-11-16 NOTE — Patient Instructions (Signed)
Patient instructed to take medications as defined in the Anti-coagulation Track section of this encounter.  Patient instructed to take today's dose.  Patient instructed to take one (1) of your 4 mg blue warfarin tablets on Mondays, Wednesdays,  and Fridays. All other days, take only one-half (1/2) of your 4mg  blue warfarin tablets. Patient verbalized understanding of these instructions.

## 2020-11-16 NOTE — Progress Notes (Signed)
Anticoagulation Management Caroline [name] is a 75 y.o. female who reports to the clinic for monitoring of warfarin treatment.    Indication: DVT with history of recurrence; long term current use of warfarin oral anticoagulant to maintain INR 2.0 - 3.0. Duration: indefinite Supervising physician: Joni Reining  Anticoagulation Clinic Visit History: Patient does not report signs/symptoms of bleeding or thromboembolism  Other recent changes: No diet, medications, lifestyle changes cited by the patient. 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] PULMONARY EMBOLISM HX OF (Resolved) [Z86.718]        Comments:           Allergies  Allergen Reactions   Penicillins Anaphylaxis, Hives, Swelling and Rash    Has patient had a PCN reaction causing immediate rash, facial/tongue/throat swelling, SOB or lightheadedness with hypotension: Yes Has patient had a PCN reaction causing severe rash involving mucus membranes or skin necrosis: Yes Has patient had a PCN reaction that required hospitalization Yes Has patient had a PCN reaction occurring within the last 10 years: No If all of the above answers are "NO", then may proceed with Cephalosporin use.    Cabbage Itching   Keflex [Cephalexin] Hives    And feeling of throat tightness   Shellfish Allergy Swelling   Tomato Swelling   Latex Itching and Rash    Current Outpatient Medications:    carvedilol (COREG) 12.5 MG tablet, Take 1 tablet (12.5 mg total) by mouth 2 (two) times daily. Please keep upcoming appt in September 2022 with Dr. Curt Bears before anymore refills. Thank you Final Attempt, Disp: 180 tablet, Rfl: 1    cholecalciferol (VITAMIN D3) 25 MCG (1000 UNIT) tablet, Take 1 tablet (1,000 Units total) by mouth daily., Disp: 90 tablet, Rfl: 1   EPINEPHrine 0.3 mg/0.3 mL IJ SOAJ injection, Inject 0.3 mg into the muscle as needed. Severe allergic reactions, Disp: , Rfl: 0   fluticasone (FLONASE) 50 MCG/ACT nasal spray, PLACE 2 SPRAYS DAILY INTO BOTH NOSTRILS., Disp: 48 mL, Rfl: 1   hydrocortisone (PROCTOSOL HC) 2.5 % rectal cream, Place 1 application rectally 2 (two) times daily., Disp: 30 g, Rfl: 0   losartan-hydrochlorothiazide (HYZAAR) 100-25 MG tablet, Take 1 tablet by mouth daily., Disp: 30 tablet, Rfl: 6   meclizine (ANTIVERT) 25 MG tablet, TAKE 1 TABLET BY MOUTH THREE TIMES A DAY AS NEEDED, Disp: 90 tablet, Rfl: 5   metFORMIN (GLUCOPHAGE-XR) 500 MG 24 hr tablet, START WITH 500 MG 1*DAY FOR 1 WEEK AND THEN INCREASE TO 500 MG 2*DAY, Disp: 180 tablet, Rfl: 1   montelukast (SINGULAIR) 10 MG tablet, TAKE 1 TABLET BY MOUTH EVERYDAY AT BEDTIME, Disp: 90 tablet, Rfl: 3   pantoprazole (PROTONIX) 40 MG tablet, TAKE 1 TABLET BY MOUTH EVERY DAY, Disp: 90 tablet, Rfl: 1   PROAIR HFA 108 (90 Base) MCG/ACT inhaler, TAKE 2 PUFFS BY MOUTH EVERY 6 HOURS AS NEEDED FOR WHEEZE OR SHORTNESS OF BREATH (Patient taking differently: Inhale 2 puffs into the lungs every 6 (six) hours as needed for wheezing or shortness of breath.), Disp: 8.5 Inhaler, Rfl: 2   Psyllium (METAMUCIL PO), Take  by mouth at bedtime. gummies, Disp: , Rfl:    RESTASIS 0.05 % ophthalmic emulsion, INSTILL 1 DROP INTO BOTH EYES TWICE A DAY, Disp: 60 mL, Rfl: 1   rosuvastatin (CRESTOR) 20 MG tablet, TAKE 1 TABLET BY MOUTH EVERY DAY, Disp: 90 tablet, Rfl: 3   SYMBICORT 160-4.5 MCG/ACT inhaler, TAKE 2 PUFFS BY MOUTH TWICE A DAY, Disp: 30.6 each, Rfl: 4   warfarin (COUMADIN) 4 MG tablet, Take one (1) of your blue, 4mg  strength warfarin tabletst on Sundays, Tuesdays, Thursdays and Saturdays. On Mondays, Wednesdays and Fridays, take only 1/2 tablet., Disp: 74 tablet,  Rfl: 3 Past Medical History:  Diagnosis Date   Arthritis    Asthma    Bronchitis    CHF (congestive heart failure) (HCC)    Diabetes (HCC) 2019   Diverticulitis    DVT, lower extremity, recurrent (HCC)    Patient had unprovoked PE on 2002 and DVT in right lower extremety 2008.   GERD (gastroesophageal reflux disease)    Hematuria, microscopic 09/13/2017   History of kidney stones    Hypertension    PE (pulmonary embolism)    Patient had unprovoked PE on 2002   PONV (postoperative nausea and vomiting)    Vertigo    Social History   Socioeconomic History   Marital status: Divorced    Spouse name: Not on file   Number of children: 5   Years of education: 9 th grade   Highest education level: 9th grade  Occupational History   Occupation: retired  Tobacco Use   Smoking status: Former    Types: Cigarettes    Quit date: 09/18/1988    Years since quitting: 32.1   Smokeless tobacco: Never   Tobacco comments:    6 pack year smoking history as a teen  Vaping Use   Vaping Use: Never used  Substance and Sexual Activity   Alcohol use: No   Drug use: No   Sexual activity: Not Currently    Birth control/protection: None  Other Topics Concern   Not on file  Social History Narrative   Current Social History 08/28/2019   Patient lives with 39 yo granddaughter in a one story home. There are not steps up to the entrance the patient uses.       Patient's method of transportation is church member.      The highest level of education was 9th grade      The patient currently retired.      Identified important Relationships are God, family       Pets : 1 lab/pitt mix named Cinnamon       Interests / Fun: Church,TV       Current Stressors: "Me and my granddaughter"       Religious / Personal Beliefs: I'm Holiness and I love people"       Other: "I'd give away my last dime."       L. Ducatte, BSN, RN-BC    Social Determinants of Health   Financial Resource Strain: Not on file   Food Insecurity: Not on file  Transportation Needs: Not on file  Physical Activity: Not on file  Stress: Not on file  Social Connections: Not on file   Family History  Problem Relation Age of Onset   Cancer Mother    Hypertension Sister    Diabetes Sister    Hypertension Brother    Diabetes Brother    Hypertension Sister    Diabetes Sister    Colon  cancer Other    Heart Problems Other 65       sister's child, open heart surgery   CVA Father    Diabetes Son    Hypertension Son    Kidney disease Son        on dialysis   Hypertension Sister    Diabetes Sister    Hypertension Sister    Diabetes Sister    Cancer Brother    Hypertension Son    Diabetes Son    Hypertension Daughter    Schizophrenia Daughter     ASSESSMENT Recent Results: The most recent result is correlated with 20 mg per week: Lab Results  Component Value Date   INR 2.8 11/16/2020   INR 2.9 10/19/2020   INR 2.1 08/17/2020    Anticoagulation Dosing: Description   Take one (1) of your 4 mg blue warfarin tablets on Mondays, Wednesdays,  and Fridays. All other days, take only one-half (1/2) of your 4mg  blue warfarin tablets.     INR today: Therapeutic  PLAN Weekly dose was unchanged.   Patient Instructions  Patient instructed to take medications as defined in the Anti-coagulation Track section of this encounter.  Patient instructed to take today's dose.  Patient instructed to take one (1) of your 4 mg blue warfarin tablets on Mondays, Wednesdays,  and Fridays. All other days, take only one-half (1/2) of your 4mg  blue warfarin tablets. Patient verbalized understanding of these instructions.   Patient advised to contact clinic or seek medical attention if signs/symptoms of bleeding or thromboembolism occur.  Patient verbalized understanding by repeating back information and was advised to contact me if further medication-related questions arise. Patient was also provided an information  handout.  Follow-up Return in about 5 weeks (around 12/21/2020) for Follow up INR.  Pennie Banter, PharmD, CPP  15 minutes spent face-to-face with the patient during the encounter. 50% of time spent on education, including signs/sx bleeding and clotting, as well as food and drug interactions with warfarin. 50% of time was spent on fingerprick POC INR sample collection,processing, results determination, and documentation in http://www.kim.net/.

## 2020-11-23 ENCOUNTER — Other Ambulatory Visit: Payer: Self-pay | Admitting: Internal Medicine

## 2020-11-23 DIAGNOSIS — R42 Dizziness and giddiness: Secondary | ICD-10-CM

## 2020-12-21 ENCOUNTER — Ambulatory Visit (INDEPENDENT_AMBULATORY_CARE_PROVIDER_SITE_OTHER): Payer: Medicare Other | Admitting: Pharmacist

## 2020-12-21 DIAGNOSIS — Z7901 Long term (current) use of anticoagulants: Secondary | ICD-10-CM | POA: Diagnosis not present

## 2020-12-21 DIAGNOSIS — I82409 Acute embolism and thrombosis of unspecified deep veins of unspecified lower extremity: Secondary | ICD-10-CM

## 2020-12-21 LAB — POCT INR: INR: 2.2 (ref 2.0–3.0)

## 2020-12-21 NOTE — Progress Notes (Signed)
Anticoagulation Management Caroline [name] is a 75 y.o. female who reports to the clinic for monitoring of warfarin treatment.    Indication: DVT , history of; long term current use of oral anticoagulant warfarin to target INR 2.0 - 3.0.  Duration: indefinite Supervising physician: Debe Coder  Anticoagulation Clinic Visit History: Patient does not report signs/symptoms of bleeding or thromboembolism  Other recent changes: No diet, medications, lifestyle changes endorsed by the patient at this visit.  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] PULMONARY EMBOLISM HX OF (Resolved) [Z86.718]        Comments:           Allergies  Allergen Reactions   Penicillins Anaphylaxis, Hives, Swelling and Rash    Has patient had a PCN reaction causing immediate rash, facial/tongue/throat swelling, SOB or lightheadedness with hypotension: Yes Has patient had a PCN reaction causing severe rash involving mucus membranes or skin necrosis: Yes Has patient had a PCN reaction that required hospitalization Yes Has patient had a PCN reaction occurring within the last 10 years: No If all of the above answers are "NO", then may proceed with Cephalosporin use.    Cabbage Itching   Keflex [Cephalexin] Hives    And feeling of throat tightness   Shellfish Allergy Swelling   Tomato Swelling   Latex Itching and Rash    Current Outpatient Medications:    carvedilol (COREG) 12.5 MG tablet, Take 1 tablet (12.5 mg total) by mouth 2 (two) times daily. Please keep upcoming appt in September 2022 with Dr. Elberta Fortis before anymore refills. Thank you Final Attempt, Disp: 180 tablet, Rfl:  1   cholecalciferol (VITAMIN D3) 25 MCG (1000 UNIT) tablet, Take 1 tablet (1,000 Units total) by mouth daily., Disp: 90 tablet, Rfl: 1   EPINEPHrine 0.3 mg/0.3 mL IJ SOAJ injection, Inject 0.3 mg into the muscle as needed. Severe allergic reactions, Disp: , Rfl: 0   fluticasone (FLONASE) 50 MCG/ACT nasal spray, PLACE 2 SPRAYS DAILY INTO BOTH NOSTRILS., Disp: 48 mL, Rfl: 1   hydrocortisone (PROCTOSOL HC) 2.5 % rectal cream, Place 1 application rectally 2 (two) times daily., Disp: 30 g, Rfl: 0   losartan-hydrochlorothiazide (HYZAAR) 100-25 MG tablet, Take 1 tablet by mouth daily., Disp: 30 tablet, Rfl: 6   meclizine (ANTIVERT) 25 MG tablet, TAKE 1 TABLET BY MOUTH THREE TIMES A DAY AS NEEDED, Disp: 90 tablet, Rfl: 5   metFORMIN (GLUCOPHAGE-XR) 500 MG 24 hr tablet, START WITH 500 MG 1*DAY FOR 1 WEEK AND THEN INCREASE TO 500 MG 2*DAY, Disp: 180 tablet, Rfl: 1   montelukast (SINGULAIR) 10 MG tablet, TAKE 1 TABLET BY MOUTH EVERYDAY AT BEDTIME, Disp: 90 tablet, Rfl: 3   pantoprazole (PROTONIX) 40 MG tablet, TAKE 1 TABLET BY MOUTH EVERY DAY, Disp: 90 tablet, Rfl: 1   PROAIR HFA 108 (90 Base) MCG/ACT inhaler, TAKE 2 PUFFS BY MOUTH EVERY 6 HOURS AS NEEDED FOR WHEEZE OR SHORTNESS OF BREATH (Patient taking differently: Inhale 2 puffs into the lungs every 6 (six) hours as needed for wheezing or shortness of breath.), Disp: 8.5 Inhaler, Rfl: 2  Psyllium (METAMUCIL PO), Take by mouth at bedtime. gummies, Disp: , Rfl:    RESTASIS 0.05 % ophthalmic emulsion, INSTILL 1 DROP INTO BOTH EYES TWICE A DAY, Disp: 60 mL, Rfl: 1   rosuvastatin (CRESTOR) 20 MG tablet, TAKE 1 TABLET BY MOUTH EVERY DAY, Disp: 90 tablet, Rfl: 3   SYMBICORT 160-4.5 MCG/ACT inhaler, TAKE 2 PUFFS BY MOUTH TWICE A DAY, Disp: 30.6 each, Rfl: 4   warfarin (COUMADIN) 4 MG tablet, Take one (1) of your blue, 4mg  strength warfarin tabletst on Sundays, Tuesdays, Thursdays and Saturdays. On Mondays, Wednesdays and Fridays, take only 1/2 tablet., Disp: 74  tablet, Rfl: 3 Past Medical History:  Diagnosis Date   Arthritis    Asthma    Bronchitis    CHF (congestive heart failure) ([place])    Diabetes ([place]) 2019   Diverticulitis    DVT, lower extremity, recurrent ([place])    Patient had unprovoked PE on 2002 and DVT in right lower extremety 2008.   GERD (gastroesophageal reflux disease)    Hematuria, microscopic 09/13/2017   History of kidney stones    Hypertension    PE (pulmonary embolism)    Patient had unprovoked PE on 2002   PONV (postoperative nausea and vomiting)    Vertigo    Social History   Socioeconomic History   Marital status: Divorced    Spouse name: Not on file   Number of children: 5   Years of education: 9 th grade   Highest education level: 9th grade  Occupational History   Occupation: retired  Tobacco Use   Smoking status: Former    Types: Cigarettes    Quit date: 09/18/1988    Years since quitting: 32.2   Smokeless tobacco: Never   Tobacco comments:    6 pack year smoking history as a teen  Vaping Use   Vaping Use: Never used  Substance and Sexual Activity   Alcohol use: No   Drug use: No   Sexual activity: Not Currently    Birth control/protection: None  Other Topics Concern   Not on file  Social History Narrative   Current Social History 08/28/2019   Patient lives with 59 yo granddaughter in a one story home. There are not steps up to the entrance the patient uses.       Patient's method of transportation is church member.      The highest level of education was 9th grade      The patient currently retired.      Identified important Relationships are God, family       Pets : 1 lab/pitt mix named Cinnamon       Interests / Fun: Church,TV       Current Stressors: "Me and my granddaughter"       Religious / Personal Beliefs: I'm Holiness and I love people"       Other: "I'd give away my last dime."       L. Ducatte, BSN, RN-BC    Social Determinants of Health   Financial Resource Strain: Not  on file  Food Insecurity: Not on file  Transportation Needs: Not on file  Physical Activity: Not on file  Stress: Not on file  Social Connections: Not on file   Family History  Problem Relation Age of Onset   Cancer Mother    Hypertension Sister    Diabetes Sister    Hypertension Brother    Diabetes Brother    Hypertension Sister    Diabetes Sister  Colon cancer Other    Heart Problems Other 11       sister's child, open heart surgery   CVA Father    Diabetes Son    Hypertension Son    Kidney disease Son        on dialysis   Hypertension Sister    Diabetes Sister    Hypertension Sister    Diabetes Sister    Cancer Brother    Hypertension Son    Diabetes Son    Hypertension Daughter    Schizophrenia Daughter     ASSESSMENT Recent Results: The most recent result is correlated with 20 mg per week: Lab Results  Component Value Date   INR 2.2 12/21/2020   INR 2.8 11/16/2020   INR 2.9 10/19/2020    Anticoagulation Dosing: Description   Take one (1) of your 4 mg blue warfarin tablets on Mondays, Wednesdays,  and Fridays. All other days, take only one-half (1/2) of your 4mg  blue warfarin tablets.     INR today: Therapeutic  PLAN Weekly dose was unchange. Continue to take 1 of your 4mg  strength blue warfarin tablets on Mondays, Wednesdays and Fridays. All OTHER days, take only 1/2 of your tablet.  Patient Instructions  Patient instructed to take medications as defined in the Anti-coagulation Track section of this encounter.  Patient instructed to take today's dose.  Patient instructed to take one (1) of your 4 mg blue warfarin tablets on Mondays, Wednesdays,  and Fridays. All other days, take only one-half (1/2) of your 4mg  blue warfarin tablets. Patient verbalized understanding of these instructions.   Patient advised to contact clinic or seek medical attention if signs/symptoms of bleeding or thromboembolism occur.  Patient verbalized understanding by  repeating back information and was advised to contact me if further medication-related questions arise. Patient was also provided an information handout.  Follow-up Return in 6 weeks (on 02/01/2021) for Follow up INR.  Pennie Banter, PharmD, CPP  15 minutes spent face-to-face with the patient during the encounter. 50% of time spent on education, including signs/sx bleeding and clotting, as well as food and drug interactions with warfarin. 50% of time was spent on fingerprick POC INR sample collection,processing, results determination, and documentation in http://www.kim.net/.

## 2020-12-21 NOTE — Patient Instructions (Signed)
Patient instructed to take medications as defined in the Anti-coagulation Track section of this encounter.  Patient instructed to take today's dose.  Patient instructed to take one (1) of your 4 mg blue warfarin tablets on Mondays, Wednesdays,  and Fridays. All other days, take only one-half (1/2) of your 4mg  blue warfarin tablets. Patient verbalized understanding of these instructions.

## 2020-12-28 NOTE — Progress Notes (Signed)
Evaluation and management procedures were performed by the Clinical Pharmacy Practitioner under my supervision and collaboration. I have reviewed the Practitioner's note and chart, and I agree with the management and plan as documented above. ° °

## 2021-01-09 ENCOUNTER — Other Ambulatory Visit: Payer: Self-pay | Admitting: Cardiology

## 2021-01-18 ENCOUNTER — Other Ambulatory Visit: Payer: Self-pay | Admitting: Internal Medicine

## 2021-01-18 NOTE — Telephone Encounter (Signed)
Next appt scheduled 12/13/21 with PCP.. 

## 2021-01-20 ENCOUNTER — Other Ambulatory Visit: Payer: Self-pay | Admitting: Internal Medicine

## 2021-01-20 ENCOUNTER — Other Ambulatory Visit: Payer: Self-pay | Admitting: Cardiology

## 2021-01-20 DIAGNOSIS — I10 Essential (primary) hypertension: Secondary | ICD-10-CM

## 2021-01-20 NOTE — Telephone Encounter (Incomplete Revision)
Patient did not keep 09/2020 appointment with Dr. Curt Bears as requested.

## 2021-01-20 NOTE — Telephone Encounter (Signed)
Patient did not keep 09/2020 appointment with Dr.

## 2021-02-01 ENCOUNTER — Ambulatory Visit (INDEPENDENT_AMBULATORY_CARE_PROVIDER_SITE_OTHER): Payer: Medicare Other | Admitting: Pharmacist

## 2021-02-01 DIAGNOSIS — Z7901 Long term (current) use of anticoagulants: Secondary | ICD-10-CM | POA: Diagnosis not present

## 2021-02-01 DIAGNOSIS — I82402 Acute embolism and thrombosis of unspecified deep veins of left lower extremity: Secondary | ICD-10-CM

## 2021-02-01 LAB — POCT INR: INR: 2.3 (ref 2.0–3.0)

## 2021-02-01 NOTE — Patient Instructions (Signed)
Patient instructed to take medications as defined in the Anti-coagulation Track section of this encounter.  Patient instructed to take today's dose.  Patient instructed to take one (1) of your 4 mg blue warfarin tablets on Mondays, Wednesdays, Fridays AND Saturdays.  All other days, (Sundays, Tuesdays and Thursdays) take only one-half (1/2) of your 4mg  blue warfarin tablets. Patient verbalized understanding of these instructions.

## 2021-02-01 NOTE — Progress Notes (Signed)
INTERNAL MEDICINE TEACHING ATTENDING ADDENDUM - [name] M.D  Duration- indefinite, Indication- recurrent VTE, INR- therapeutic. Agree with pharmacy recommendations as outlined in their note.     

## 2021-02-01 NOTE — Progress Notes (Signed)
Anticoagulation Management Caroline [name] is a 76 y.o. female who reports to the clinic for monitoring of warfarin treatment.    Indication: DVT , history of; with recurrence; long term current use of oral anticoagulant warfarin to INR target range of 2.0 - 3.0.  Duration: indefinite Supervising physician: Aldine Contes  Anticoagulation Clinic Visit History: Patient does not report signs/symptoms of bleeding or thromboembolism  Other recent changes: No diet, medications, lifestyle changes endorsed by the patient at this visit.  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] PULMONARY EMBOLISM HX OF (Resolved) [Z86.718]        Comments:           Allergies  Allergen Reactions   Penicillins Anaphylaxis, Hives, Swelling and Rash    Has patient had a PCN reaction causing immediate rash, facial/tongue/throat swelling, SOB or lightheadedness with hypotension: Yes Has patient had a PCN reaction causing severe rash involving mucus membranes or skin necrosis: Yes Has patient had a PCN reaction that required hospitalization Yes Has patient had a PCN reaction occurring within the last 10 years: No If all of the above answers are "NO", then may proceed with Cephalosporin use.    Cabbage Itching   Keflex [Cephalexin] Hives    And feeling of throat tightness   Shellfish Allergy Swelling   Tomato Swelling   Latex Itching and Rash    Current Outpatient Medications:    carvedilol (COREG) 12.5 MG tablet, TAKE 1 TABLET BY MOUTH 2 (TWO) TIMES DAILY. PLEASE KEEP UPCOMING APPT IN SEPTEMBER 2022, Disp: 180 tablet, Rfl: 1   cholecalciferol (VITAMIN D) 25 MCG (1000 UNIT)  tablet, TAKE 1 TABLET BY MOUTH EVERY DAY, Disp: 90 tablet, Rfl: 1   EPINEPHrine 0.3 mg/0.3 mL IJ SOAJ injection, Inject 0.3 mg into the muscle as needed. Severe allergic reactions, Disp: , Rfl: 0   fluticasone (FLONASE) 50 MCG/ACT nasal spray, PLACE 2 SPRAYS DAILY INTO BOTH NOSTRILS., Disp: 48 mL, Rfl: 1   hydrocortisone (PROCTOSOL HC) 2.5 % rectal cream, Place 1 application rectally 2 (two) times daily., Disp: 30 g, Rfl: 0   losartan-hydrochlorothiazide (HYZAAR) 100-25 MG tablet, TAKE 1 TABLET BY MOUTH EVERY DAY, Disp: 90 tablet, Rfl: 1   meclizine (ANTIVERT) 25 MG tablet, TAKE 1 TABLET BY MOUTH THREE TIMES A DAY AS NEEDED, Disp: 90 tablet, Rfl: 5   metFORMIN (GLUCOPHAGE-XR) 500 MG 24 hr tablet, START WITH 500 MG 1*DAY FOR 1 WEEK AND THEN INCREASE TO 500 MG 2*DAY, Disp: 180 tablet, Rfl: 1   montelukast (SINGULAIR) 10 MG tablet, TAKE 1 TABLET BY MOUTH EVERYDAY AT BEDTIME, Disp: 90 tablet, Rfl: 3   pantoprazole (PROTONIX) 40 MG tablet, TAKE 1 TABLET BY MOUTH EVERY DAY, Disp: 90 tablet, Rfl: 1   PROAIR HFA 108 (90 Base) MCG/ACT inhaler, TAKE 2 PUFFS BY MOUTH EVERY 6 HOURS AS NEEDED FOR WHEEZE OR SHORTNESS OF BREATH (Patient taking differently: Inhale 2 puffs into the lungs every 6 (six) hours as needed for wheezing or shortness of breath.), Disp: 8.5 Inhaler, Rfl: 2   Psyllium (METAMUCIL PO), Take by mouth at bedtime. gummies, Disp: ,  Rfl:    RESTASIS 0.05 % ophthalmic emulsion, INSTILL 1 DROP INTO BOTH EYES TWICE A DAY, Disp: 60 mL, Rfl: 1   rosuvastatin (CRESTOR) 20 MG tablet, TAKE 1 TABLET BY MOUTH EVERY DAY, Disp: 90 tablet, Rfl: 3   SYMBICORT 160-4.5 MCG/ACT inhaler, TAKE 2 PUFFS BY MOUTH TWICE A DAY, Disp: 30.6 each, Rfl: 4   warfarin (COUMADIN) 4 MG tablet, Take one (1) of your blue, 4mg  strength warfarin tabletst on Sundays, Tuesdays, Thursdays and Saturdays. On Mondays, Wednesdays and Fridays, take only 1/2 tablet., Disp: 74 tablet, Rfl: 3 Past Medical History:  Diagnosis Date   Arthritis     Asthma    Bronchitis    CHF (congestive heart failure) ([place])    Diabetes ([place]) 2019   Diverticulitis    DVT, lower extremity, recurrent ([place])    Patient had unprovoked PE on 2002 and DVT in right lower extremety 2008.   GERD (gastroesophageal reflux disease)    Hematuria, microscopic 09/13/2017   History of kidney stones    Hypertension    PE (pulmonary embolism)    Patient had unprovoked PE on 2002   PONV (postoperative nausea and vomiting)    Vertigo    Social History   Socioeconomic History   Marital status: Divorced    Spouse name: Not on file   Number of children: 5   Years of education: 9 th grade   Highest education level: 9th grade  Occupational History   Occupation: retired  Tobacco Use   Smoking status: Former    Types: Cigarettes    Quit date: 09/18/1988    Years since quitting: 32.3   Smokeless tobacco: Never   Tobacco comments:    6 pack year smoking history as a teen  Vaping Use   Vaping Use: Never used  Substance and Sexual Activity   Alcohol use: No   Drug use: No   Sexual activity: Not Currently    Birth control/protection: None  Other Topics Concern   Not on file  Social History Narrative   Current Social History 08/28/2019   Patient lives with 64 yo granddaughter in a one story home. There are not steps up to the entrance the patient uses.       Patient's method of transportation is church member.      The highest level of education was 9th grade      The patient currently retired.      Identified important Relationships are God, family       Pets : 1 lab/pitt mix named Cinnamon       Interests / Fun: Church,TV       Current Stressors: "Me and my granddaughter"       Religious / Personal Beliefs: I'm Holiness and I love people"       Other: "I'd give away my last dime."       L. Ducatte, BSN, RN-BC    Social Determinants of Health   Financial Resource Strain: Not on file  Food Insecurity: Not on file  Transportation Needs: Not on  file  Physical Activity: Not on file  Stress: Not on file  Social Connections: Not on file   Family History  Problem Relation Age of Onset   Cancer Mother    Hypertension Sister    Diabetes Sister    Hypertension Brother    Diabetes Brother    Hypertension Sister    Diabetes Sister    Colon cancer Other    Heart Problems  Other 74       sister's child, open heart surgery   CVA Father    Diabetes Son    Hypertension Son    Kidney disease Son        on dialysis   Hypertension Sister    Diabetes Sister    Hypertension Sister    Diabetes Sister    Cancer Brother    Hypertension Son    Diabetes Son    Hypertension Daughter    Schizophrenia Daughter     ASSESSMENT Recent Results: The most recent result is correlated with 20 mg per week: Lab Results  Component Value Date   INR 2.3 02/01/2021   INR 2.2 12/21/2020   INR 2.8 11/16/2020    Anticoagulation Dosing: Description   Take one (1) of your 4 mg blue warfarin tablets on Mondays, Wednesdays, Fridays AND Saturdays.  All other days, (Sundays, Tuesdays and Thursdays) take only one-half (1/2) of your 4mg  blue warfarin tablets.     INR today: Therapeutic  PLAN Weekly dose was increased by 10% to 22 mg per week  Patient Instructions  Patient instructed to take medications as defined in the Anti-coagulation Track section of this encounter.  Patient instructed to take today's dose.  Patient instructed to take one (1) of your 4 mg blue warfarin tablets on Mondays, Wednesdays, Fridays AND Saturdays.  All other days, (Sundays, Tuesdays and Thursdays) take only one-half (1/2) of your 4mg  blue warfarin tablets. Patient verbalized understanding of these instructions.   Patient advised to contact clinic or seek medical attention if signs/symptoms of bleeding or thromboembolism occur.  Patient verbalized understanding by repeating back information and was advised to contact me if further medication-related questions arise.  Patient was also provided an information handout.  Follow-up Return in 4 weeks (on 03/01/2021) for Follow up INR.  Pennie Banter, PharmD, CPP  15 minutes spent face-to-face with the patient during the encounter. 50% of time spent on education, including signs/sx bleeding and clotting, as well as food and drug interactions with warfarin. 50% of time was spent on fingerprick POC INR sample collection,processing, results determination, and documentation in [email or web address]

## 2021-02-02 ENCOUNTER — Encounter: Payer: Medicare Other | Admitting: Internal Medicine

## 2021-02-03 ENCOUNTER — Other Ambulatory Visit: Payer: Self-pay | Admitting: Internal Medicine

## 2021-02-03 DIAGNOSIS — E7800 Pure hypercholesterolemia, unspecified: Secondary | ICD-10-CM

## 2021-02-11 ENCOUNTER — Other Ambulatory Visit: Payer: Self-pay | Admitting: Internal Medicine

## 2021-02-11 DIAGNOSIS — K219 Gastro-esophageal reflux disease without esophagitis: Secondary | ICD-10-CM

## 2021-02-15 ENCOUNTER — Other Ambulatory Visit: Payer: Self-pay | Admitting: Internal Medicine

## 2021-02-15 DIAGNOSIS — E118 Type 2 diabetes mellitus with unspecified complications: Secondary | ICD-10-CM

## 2021-02-16 NOTE — Telephone Encounter (Signed)
Next appt scheduled 03/01/21 with Dr Aslam. °

## 2021-03-01 ENCOUNTER — Ambulatory Visit (INDEPENDENT_AMBULATORY_CARE_PROVIDER_SITE_OTHER): Payer: Medicare Other | Admitting: Internal Medicine

## 2021-03-01 ENCOUNTER — Other Ambulatory Visit: Payer: Self-pay

## 2021-03-01 ENCOUNTER — Encounter: Payer: Self-pay | Admitting: Internal Medicine

## 2021-03-01 ENCOUNTER — Other Ambulatory Visit: Payer: Self-pay | Admitting: Pharmacist

## 2021-03-01 ENCOUNTER — Ambulatory Visit (INDEPENDENT_AMBULATORY_CARE_PROVIDER_SITE_OTHER): Payer: Medicare Other | Admitting: Pharmacist

## 2021-03-01 VITALS — BP 130/67 | HR 91 | Temp 98.2°F | Ht 62.0 in | Wt 278.4 lb

## 2021-03-01 DIAGNOSIS — I82402 Acute embolism and thrombosis of unspecified deep veins of left lower extremity: Secondary | ICD-10-CM

## 2021-03-01 DIAGNOSIS — E118 Type 2 diabetes mellitus with unspecified complications: Secondary | ICD-10-CM | POA: Diagnosis not present

## 2021-03-01 DIAGNOSIS — Z1211 Encounter for screening for malignant neoplasm of colon: Secondary | ICD-10-CM | POA: Diagnosis not present

## 2021-03-01 DIAGNOSIS — I10 Essential (primary) hypertension: Secondary | ICD-10-CM

## 2021-03-01 DIAGNOSIS — E785 Hyperlipidemia, unspecified: Secondary | ICD-10-CM | POA: Diagnosis not present

## 2021-03-01 DIAGNOSIS — Z7901 Long term (current) use of anticoagulants: Secondary | ICD-10-CM

## 2021-03-01 DIAGNOSIS — K649 Unspecified hemorrhoids: Secondary | ICD-10-CM | POA: Diagnosis not present

## 2021-03-01 DIAGNOSIS — I82409 Acute embolism and thrombosis of unspecified deep veins of unspecified lower extremity: Secondary | ICD-10-CM

## 2021-03-01 DIAGNOSIS — Z0000 Encounter for general adult medical examination without abnormal findings: Secondary | ICD-10-CM

## 2021-03-01 LAB — POCT INR: INR: 4.1 — AB (ref 2.0–3.0)

## 2021-03-01 LAB — POCT GLYCOSYLATED HEMOGLOBIN (HGB A1C): Hemoglobin A1C: 6.7 % — AB (ref 4.0–5.6)

## 2021-03-01 LAB — GLUCOSE, CAPILLARY: Glucose-Capillary: 161 mg/dL — ABNORMAL HIGH (ref 70–99)

## 2021-03-01 MED ORDER — HYDROCORTISONE (PERIANAL) 2.5 % EX CREA: 1.0000 "application " | TOPICAL_CREAM | Freq: Two times a day (BID) | CUTANEOUS | 0 refills | Status: DC

## 2021-03-01 MED ORDER — CARVEDILOL 12.5 MG PO TABS: 12.5000 mg | ORAL_TABLET | Freq: Two times a day (BID) | ORAL | 1 refills | Status: DC

## 2021-03-01 MED ORDER — WARFARIN SODIUM 4 MG PO TABS: ORAL_TABLET | ORAL | 1 refills | Status: DC

## 2021-03-01 MED ORDER — LOSARTAN POTASSIUM-HCTZ 100-25 MG PO TABS: 1.0000 | ORAL_TABLET | Freq: Every day | ORAL | 1 refills | Status: DC

## 2021-03-01 MED ORDER — METFORMIN HCL ER 500 MG PO TB24: 500.0000 mg | ORAL_TABLET | Freq: Two times a day (BID) | ORAL | 1 refills | Status: DC

## 2021-03-01 NOTE — Patient Instructions (Signed)
Patient instructed to take medications as defined in the Anti-coagulation Track section of this encounter.  Patient instructed to OMIT today's dose.  Patient instructed to OMIT dose of warfarin for today, Monday March 01, 2021. Recommence your warfarin tomorrow, Tuesday March 02, 2021. Take one tablet on Wednesdays and Saturdays. All other days, take only 1/2 tablet.  Patient verbalized understanding of these instructions.

## 2021-03-01 NOTE — Assessment & Plan Note (Signed)
Patient has a history of recurrent VTE on chronic coumadin therapy. INR goal 2-3, and has been therapeutic upon last few checks, although INR is elevated today to 4.1. No signs of bleeding or bruising on exam. Patient discussed this with Dr. Elie Confer, who advised the patient to hold her coumadin today and to take 1/2 a tablet for the next few days. Will recheck INR on 3/6 (2 weeks from today).

## 2021-03-01 NOTE — Patient Instructions (Signed)
Thank you, Ms.Stepahnie N Breaker for allowing [place] to provide your care today. Today we discussed:  Warfarin: Take your warfarin as directed by Dr. Alexandria Lodge. We will recheck your INR on 3/6. Continue to watch for easy bruising/bleeding, since your INR was high today  Diabetes: Your diabetes is well controlled, congratulations! Continue to take your metformin 500 mg twice daily  High blood pressure: Your blood pressure was excellent today. Continue to take your hyzaar once daily and your coreg/carvedilol twice daily  Healthcare maintenance: FIT (stool) test ordered today (to check for colon cancer  I have ordered the following labs for you:  Lab Orders         Glucose, capillary         Microalbumin / Creatinine Urine Ratio         POC Hbg A1C         POC FIT Test      Tests ordered today:  none  Referrals ordered today:   Referral Orders  No referral(s) requested today     I have ordered the following medication/changed the following medications:   Stop the following medications: Medications Discontinued During This Encounter  Medication Reason   hydrocortisone (PROCTOSOL HC) 2.5 % rectal cream Reorder   carvedilol (COREG) 12.5 MG tablet Reorder   losartan-hydrochlorothiazide (HYZAAR) 100-25 MG tablet Reorder   metFORMIN (GLUCOPHAGE-XR) 500 MG 24 hr tablet Reorder   warfarin (COUMADIN) 4 MG tablet Reorder     Start the following medications: Meds ordered this encounter  Medications   warfarin (COUMADIN) 4 MG tablet    Sig: Take 1/2 tablet all days of the week, EXCEPT on Wednesdays and Saturdays, take one (1) tablet on these days.    Dispense:  60 tablet    Refill:  1   hydrocortisone (PROCTOSOL HC) 2.5 % rectal cream    Sig: Place 1 application rectally 2 (two) times daily.    Dispense:  30 g    Refill:  0   losartan-hydrochlorothiazide (HYZAAR) 100-25 MG tablet    Sig: Take 1 tablet by mouth daily.    Dispense:  90 tablet    Refill:  1   carvedilol (COREG) 12.5 MG tablet     Sig: Take 1 tablet (12.5 mg total) by mouth 2 (two) times daily with a meal.    Dispense:  180 tablet    Refill:  1   metFORMIN (GLUCOPHAGE-XR) 500 MG 24 hr tablet    Sig: Take 1 tablet (500 mg total) by mouth 2 (two) times daily with a meal.    Dispense:  180 tablet    Refill:  1    DX Code Needed  R.     Follow up: 3 months to see Dr. Heide Spark; appt on 3/6 for INR check only   Remember: To call if you need any further refills or if you need to be seen in our clinic sooner!  Should you have any questions or concerns please call the internal medicine clinic at [phone].     Elza Rafter, D.O. [facility]

## 2021-03-01 NOTE — Assessment & Plan Note (Signed)
A1c 6.7 today. Patient is on metformin 500 mg bid and has been doing quite well with this, as well as positive lifestyle modifications. Given her history of HF, she may benefit from an SGLT2 inhibitor, but her diabetes is currently well controlled and can consider this in the future.  Plan: - Diabetic foot exam performed today - Urine micro  - Follow up A1c check in 3 months - Refilled metformin

## 2021-03-01 NOTE — Progress Notes (Signed)
Anticoagulation Management Caroline [name] is a 76 y.o. female who reports to the clinic for monitoring of warfarin treatment.    Indication: DVT , history of left lower extremity; long term current use of oral anticoagulant warfarin for target INR 2.0 - 3.0.  Duration: indefinite Supervising physician: Aldine Contes  Anticoagulation Clinic Visit History: Patient does not report signs/symptoms of bleeding or thromboembolism  Other recent changes: No diet, medications, lifestyle changes. Denies febrile illness, or weight loss.  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] PULMONARY EMBOLISM HX OF (Resolved) [Z86.718]        Comments:           Allergies  Allergen Reactions   Penicillins Anaphylaxis, Hives, Swelling and Rash    Has patient had a PCN reaction causing immediate rash, facial/tongue/throat swelling, SOB or lightheadedness with hypotension: Yes Has patient had a PCN reaction causing severe rash involving mucus membranes or skin necrosis: Yes Has patient had a PCN reaction that required hospitalization Yes Has patient had a PCN reaction occurring within the last 10 years: No If all of the above answers are "NO", then may proceed with Cephalosporin use.    Cabbage Itching   Keflex [Cephalexin] Hives    And feeling of throat tightness   Shellfish Allergy Swelling   Tomato Swelling   Latex Itching and Rash    Current Outpatient Medications:    carvedilol (COREG) 12.5 MG tablet, TAKE 1 TABLET BY MOUTH 2 (TWO) TIMES DAILY. PLEASE KEEP UPCOMING APPT IN SEPTEMBER 2022, Disp: 180 tablet, Rfl: 1   cholecalciferol (VITAMIN D) 25 MCG (1000 UNIT)  tablet, TAKE 1 TABLET BY MOUTH EVERY DAY, Disp: 90 tablet, Rfl: 1   fluticasone (FLONASE) 50 MCG/ACT nasal spray, PLACE 2 SPRAYS DAILY INTO BOTH NOSTRILS., Disp: 48 mL, Rfl: 1   hydrocortisone (PROCTOSOL HC) 2.5 % rectal cream, Place 1 application rectally 2 (two) times daily., Disp: 30 g, Rfl: 0   losartan-hydrochlorothiazide (HYZAAR) 100-25 MG tablet, TAKE 1 TABLET BY MOUTH EVERY DAY, Disp: 90 tablet, Rfl: 1   meclizine (ANTIVERT) 25 MG tablet, TAKE 1 TABLET BY MOUTH THREE TIMES A DAY AS NEEDED, Disp: 90 tablet, Rfl: 5   metFORMIN (GLUCOPHAGE-XR) 500 MG 24 hr tablet, Take 1 tablet (500 mg total) by mouth 2 (two) times daily with a meal., Disp: 180 tablet, Rfl: 1   montelukast (SINGULAIR) 10 MG tablet, TAKE 1 TABLET BY MOUTH EVERYDAY AT BEDTIME, Disp: 90 tablet, Rfl: 3   pantoprazole (PROTONIX) 40 MG tablet, TAKE 1 TABLET BY MOUTH EVERY DAY, Disp: 90 tablet, Rfl: 1   PROAIR HFA 108 (90 Base) MCG/ACT inhaler, TAKE 2 PUFFS BY MOUTH EVERY 6 HOURS AS NEEDED FOR WHEEZE OR SHORTNESS OF BREATH (Patient taking differently: Inhale 2 puffs into the lungs every 6 (six) hours as needed for wheezing or shortness of breath.), Disp: 8.5 Inhaler, Rfl: 2   Psyllium (METAMUCIL PO), Take by mouth at bedtime. gummies, Disp: , Rfl:    RESTASIS 0.05 % ophthalmic emulsion, INSTILL 1 DROP INTO BOTH EYES TWICE A DAY, Disp: 60 mL, Rfl: 1  rosuvastatin (CRESTOR) 20 MG tablet, TAKE 1 TABLET BY MOUTH EVERY DAY, Disp: 90 tablet, Rfl: 3   SYMBICORT 160-4.5 MCG/ACT inhaler, TAKE 2 PUFFS BY MOUTH TWICE A DAY, Disp: 30.6 each, Rfl: 4   warfarin (COUMADIN) 4 MG tablet, Take one (1) of your blue, 4mg  strength warfarin tabletst on Sundays, Tuesdays, Thursdays and Saturdays. On Mondays, Wednesdays and Fridays, take only 1/2 tablet., Disp: 74 tablet, Rfl: 3   EPINEPHrine 0.3 mg/0.3 mL IJ SOAJ injection, Inject 0.3 mg into the muscle as needed. Severe allergic reactions (Patient not taking: Reported on 03/01/2021), Disp: , Rfl: 0 Past  Medical History:  Diagnosis Date   Arthritis    Asthma    Bronchitis    CHF (congestive heart failure) ([place])    Diabetes ([place]) 2019   Diverticulitis    DVT, lower extremity, recurrent ([place])    Patient had unprovoked PE on 2002 and DVT in right lower extremety 2008.   GERD (gastroesophageal reflux disease)    Hematuria, microscopic 09/13/2017   History of kidney stones    Hypertension    PE (pulmonary embolism)    Patient had unprovoked PE on 2002   PONV (postoperative nausea and vomiting)    Vertigo    Social History   Socioeconomic History   Marital status: Divorced    Spouse name: Not on file   Number of children: 5   Years of education: 9 th grade   Highest education level: 9th grade  Occupational History   Occupation: retired  Tobacco Use   Smoking status: Former    Types: Cigarettes    Quit date: 09/18/1988    Years since quitting: 32.4   Smokeless tobacco: Never   Tobacco comments:    6 pack year smoking history as a teen  Vaping Use   Vaping Use: Never used  Substance and Sexual Activity   Alcohol use: No   Drug use: No   Sexual activity: Not Currently    Birth control/protection: None  Other Topics Concern   Not on file  Social History Narrative   Current Social History 08/28/2019   Patient lives with 81 yo granddaughter in a one story home. There are not steps up to the entrance the patient uses.       Patient's method of transportation is church member.      The highest level of education was 9th grade      The patient currently retired.      Identified important Relationships are God, family       Pets : 1 lab/pitt mix named Cinnamon       Interests / Fun: Church,TV       Current Stressors: "Me and my granddaughter"       Religious / Personal Beliefs: I'm Holiness and I love people"       Other: "I'd give away my last dime."       L. Ducatte, BSN, RN-BC    Social Determinants of Health   Financial Resource Strain: Not on file  Food  Insecurity: Not on file  Transportation Needs: Not on file  Physical Activity: Not on file  Stress: Not on file  Social Connections: Not on file   Family History  Problem Relation Age of Onset   Cancer Mother    Hypertension Sister    Diabetes Sister    Hypertension Brother    Diabetes Brother    Hypertension Sister    Diabetes Sister    Colon cancer Other  Heart Problems Other 62       sister's child, open heart surgery   CVA Father    Diabetes Son    Hypertension Son    Kidney disease Son        on dialysis   Hypertension Sister    Diabetes Sister    Hypertension Sister    Diabetes Sister    Cancer Brother    Hypertension Son    Diabetes Son    Hypertension Daughter    Schizophrenia Daughter     ASSESSMENT Recent Results: The most recent result is correlated with 22 mg per week: Lab Results  Component Value Date   INR 4.1 (A) 03/01/2021   INR 2.3 02/01/2021   INR 2.2 12/21/2020    Anticoagulation Dosing: Description   OMIT dose of warfarin for today, Monday March 01, 2021. Recommence your warfarin tomorrow, Tuesday March 02, 2021. Take one tablet on Wednesdays and Saturdays. All other days, take only 1/2 tablet.      INR today: Supratherapeutic  PLAN Weekly dose was decreased by 20% to 16 mg per week  Patient Instructions  Patient instructed to take medications as defined in the Anti-coagulation Track section of this encounter.  Patient instructed to OMIT today's dose.  Patient instructed to OMIT dose of warfarin for today, Monday March 01, 2021. Recommence your warfarin tomorrow, Tuesday March 02, 2021. Take one tablet on Wednesdays and Saturdays. All other days, take only 1/2 tablet.  Patient verbalized understanding of these instructions.   Patient advised to contact clinic or seek medical attention if signs/symptoms of bleeding or thromboembolism occur.  Patient verbalized understanding by repeating back information and was advised to  contact me if further medication-related questions arise. Patient was also provided an information handout.  Follow-up Return in about 2 weeks (around 03/15/2021) for Follow up INR.  Pennie Banter, PharmD, CPP  15 minutes spent face-to-face with the patient during the encounter. 50% of time spent on education, including signs/sx bleeding and clotting, as well as food and drug interactions with warfarin. 50% of time was spent on fingerprick POC INR sample collection,processing, results determination, and documentation in [email or web address]/.

## 2021-03-01 NOTE — Assessment & Plan Note (Signed)
BP Readings from Last 3 Encounters:  03/01/21 130/67  08/17/20 135/69  07/29/20 (!) 147/67   BP 130/67 today, very well controlled on losartan-HCTZ 1010-25 mg and carvedilol 12.5 mg bid. She denies any headache, dizziness, blurry vision, chest pain, or palpitations.   Plan: - Continue losartan-hctz 100-25 - Continue coreg 12.5 mg bid

## 2021-03-01 NOTE — Telephone Encounter (Signed)
Requests refill on warfarin.  

## 2021-03-01 NOTE — Progress Notes (Signed)
INTERNAL MEDICINE TEACHING ATTENDING ADDENDUM - Orvil Faraone M.D  Duration- indefinite, Indication- recurrent VTE, INR- Supratherapeutic. Agree with pharmacy recommendations as outlined in their note.

## 2021-03-01 NOTE — Progress Notes (Signed)
° °  CC: routine visit  HPI:  Ms.[name] is a 76 y.o. female with recurrent DVTs on coumadin, HTN, DM, and HLD who presents to the [facility] for a routine visit. Please see problem-based list for further details, assessments, and plans.   Past Medical History:  Diagnosis Date   Arthritis    Asthma    Bronchitis    CHF (congestive heart failure) ([place])    Diabetes ([place]) 2019   Diverticulitis    DVT, lower extremity, recurrent ([place])    Patient had unprovoked PE on 2002 and DVT in right lower extremety 2008.   GERD (gastroesophageal reflux disease)    Hematuria, microscopic 09/13/2017   History of kidney stones    Hypertension    PE (pulmonary embolism)    Patient had unprovoked PE on 2002   PONV (postoperative nausea and vomiting)    Vertigo    Review of Systems:  Negative except as stated in HPI  Physical Exam:  Vitals:   03/01/21 1100  BP: 130/67  Pulse: 91  Temp: 98.2 F (36.8 C)  TempSrc: Oral  SpO2: 100%  Weight: 278 lb 6.4 oz (126.3 kg)  Height: 5\' 2"  (1.575 m)   General: Pleasant, well-appearing elderly female. No acute distress. CV: RRR. No murmurs. No LE edema Pulmonary: Lungs CTAB. Normal effort.  Abdominal: Soft, nontender, nondistended.  Extremities: Palpable radial and DP pulses. Normal ROM. Skin: Warm and dry.  Neuro: A&Ox3. Moves all extremities. Normal sensation. No focal deficit. Psych: Normal mood and affect   Assessment & Plan:   See Encounters Tab for problem based charting.  Patient seen with Dr. Dareen Piano

## 2021-03-01 NOTE — Assessment & Plan Note (Signed)
Patient did not receive the COVID vaccine and does not wish to. Declined shingles vaccine at this time, although would like to re-address this at a future visit

## 2021-03-02 LAB — MICROALBUMIN / CREATININE URINE RATIO
Creatinine, Urine: 67.2 mg/dL
Microalb/Creat Ratio: 67 mg/g creat — ABNORMAL HIGH (ref 0–29)
Microalbumin, Urine: 45.1 ug/mL

## 2021-03-03 NOTE — Progress Notes (Signed)
Internal Medicine Clinic Attending ° °I saw and evaluated the patient.  I personally confirmed the key portions of the history and exam documented by Dr. Atway and I reviewed pertinent patient test results.  The assessment, diagnosis, and plan were formulated together and I agree with the documentation in the resident’s note.  °

## 2021-03-15 ENCOUNTER — Other Ambulatory Visit: Payer: Self-pay

## 2021-03-15 ENCOUNTER — Ambulatory Visit (INDEPENDENT_AMBULATORY_CARE_PROVIDER_SITE_OTHER): Payer: Medicare Other | Admitting: Pharmacist

## 2021-03-15 DIAGNOSIS — I82402 Acute embolism and thrombosis of unspecified deep veins of left lower extremity: Secondary | ICD-10-CM | POA: Diagnosis not present

## 2021-03-15 DIAGNOSIS — Z7901 Long term (current) use of anticoagulants: Secondary | ICD-10-CM

## 2021-03-15 LAB — POCT INR: INR: 2.9 (ref 2.0–3.0)

## 2021-03-15 NOTE — Patient Instructions (Signed)
Patient instructed to take medications as defined in the Anti-coagulation Track section of this encounter.  ?Patient instructed to take today's dose.  ?Patient instructed to take only one-half (1/2) of your 4mg  strength blue warfarin tablet, once-daily at 6PM each day.  ?Patient verbalized understanding of these instructions.   ?

## 2021-03-15 NOTE — Progress Notes (Signed)
Anticoagulation Management Caroline [name] is a 76 y.o. female who reports to the clinic for monitoring of warfarin treatment.    Indication: DVT , History of recurrence of left lower extremity, long term current use of oral anticoagulant--warfarin, to maintain INR 2.0 - 3.0. Duration: indefinite Supervising physician:  Velna Ochs, MD  Anticoagulation Clinic Visit History: Patient does not report signs/symptoms of bleeding or thromboembolism  Other recent changes: No diet, medications, lifestyle changes endorsed by the patient at this visit.  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] PULMONARY EMBOLISM HX OF (Resolved) [Z86.718]        Comments:           Allergies  Allergen Reactions   Penicillins Anaphylaxis, Hives, Swelling and Rash    Has patient had a PCN reaction causing immediate rash, facial/tongue/throat swelling, SOB or lightheadedness with hypotension: Yes Has patient had a PCN reaction causing severe rash involving mucus membranes or skin necrosis: Yes Has patient had a PCN reaction that required hospitalization Yes Has patient had a PCN reaction occurring within the last 10 years: No If all of the above answers are "NO", then may proceed with Cephalosporin use.    Cabbage Itching   Keflex [Cephalexin] Hives    And feeling of throat tightness   Shellfish Allergy Swelling   Tomato Swelling   Latex Itching and Rash    Current Outpatient Medications:    carvedilol (COREG) 12.5 MG tablet, Take 1 tablet (12.5 mg total) by mouth 2 (two) times daily with a meal., Disp: 180 tablet, Rfl: 1   cholecalciferol (VITAMIN D) 25 MCG (1000 UNIT)  tablet, TAKE 1 TABLET BY MOUTH EVERY DAY, Disp: 90 tablet, Rfl: 1   fluticasone (FLONASE) 50 MCG/ACT nasal spray, PLACE 2 SPRAYS DAILY INTO BOTH NOSTRILS., Disp: 48 mL, Rfl: 1   hydrocortisone (PROCTOSOL HC) 2.5 % rectal cream, Place 1 application rectally 2 (two) times daily., Disp: 30 g, Rfl: 0   losartan-hydrochlorothiazide (HYZAAR) 100-25 MG tablet, Take 1 tablet by mouth daily., Disp: 90 tablet, Rfl: 1   meclizine (ANTIVERT) 25 MG tablet, TAKE 1 TABLET BY MOUTH THREE TIMES A DAY AS NEEDED, Disp: 90 tablet, Rfl: 5   metFORMIN (GLUCOPHAGE-XR) 500 MG 24 hr tablet, Take 1 tablet (500 mg total) by mouth 2 (two) times daily with a meal., Disp: 180 tablet, Rfl: 1   montelukast (SINGULAIR) 10 MG tablet, TAKE 1 TABLET BY MOUTH EVERYDAY AT BEDTIME, Disp: 90 tablet, Rfl: 3   pantoprazole (PROTONIX) 40 MG tablet, TAKE 1 TABLET BY MOUTH EVERY DAY, Disp: 90 tablet, Rfl: 1   PROAIR HFA 108 (90 Base) MCG/ACT inhaler, TAKE 2 PUFFS BY MOUTH EVERY 6 HOURS AS NEEDED FOR WHEEZE OR SHORTNESS OF BREATH (Patient taking differently: Inhale 2 puffs into the lungs every 6 (six) hours as needed for wheezing or shortness of breath.), Disp: 8.5 Inhaler, Rfl: 2   Psyllium (METAMUCIL PO), Take by mouth at bedtime. gummies, Disp: , Rfl:    RESTASIS 0.05 % ophthalmic emulsion, INSTILL 1 DROP INTO BOTH EYES TWICE A DAY, Disp: 60 mL, Rfl: 1  rosuvastatin (CRESTOR) 20 MG tablet, TAKE 1 TABLET BY MOUTH EVERY DAY, Disp: 90 tablet, Rfl: 3   SYMBICORT 160-4.5 MCG/ACT inhaler, TAKE 2 PUFFS BY MOUTH TWICE A DAY, Disp: 30.6 each, Rfl: 4   warfarin (COUMADIN) 4 MG tablet, Take 1/2 tablet all days of the week, EXCEPT on Wednesdays and Saturdays, take one (1) tablet on these days., Disp: 60 tablet, Rfl: 1   EPINEPHrine 0.3 mg/0.3 mL IJ SOAJ injection, Inject 0.3 mg into the muscle as needed. Severe allergic reactions (Patient not taking: Reported on 03/01/2021), Disp: , Rfl: 0 Past Medical History:  Diagnosis Date   Arthritis    Asthma     Bronchitis    CHF (congestive heart failure) ([place])    Diabetes ([place]) 2019   Diverticulitis    DVT, lower extremity, recurrent ([place])    Patient had unprovoked PE on 2002 and DVT in right lower extremety 2008.   GERD (gastroesophageal reflux disease)    Hematuria, microscopic 09/13/2017   History of kidney stones    Hypertension    PE (pulmonary embolism)    Patient had unprovoked PE on 2002   PONV (postoperative nausea and vomiting)    Vertigo    Social History   Socioeconomic History   Marital status: Divorced    Spouse name: Not on file   Number of children: 5   Years of education: 9 th grade   Highest education level: 9th grade  Occupational History   Occupation: retired  Tobacco Use   Smoking status: Former    Types: Cigarettes    Quit date: 09/18/1988    Years since quitting: 32.5   Smokeless tobacco: Never   Tobacco comments:    6 pack year smoking history as a teen  Vaping Use   Vaping Use: Never used  Substance and Sexual Activity   Alcohol use: No   Drug use: No   Sexual activity: Not Currently    Birth control/protection: None  Other Topics Concern   Not on file  Social History Narrative   Current Social History 08/28/2019   Patient lives with 84 yo granddaughter in a one story home. There are not steps up to the entrance the patient uses.       Patient's method of transportation is church member.      The highest level of education was 9th grade      The patient currently retired.      Identified important Relationships are God, family       Pets : 1 lab/pitt mix named Cinnamon       Interests / Fun: Church,TV       Current Stressors: "Me and my granddaughter"       Religious / Personal Beliefs: I'm Holiness and I love people"       Other: "I'd give away my last dime."       L. Ducatte, BSN, RN-BC    Social Determinants of Health   Financial Resource Strain: Not on file  Food Insecurity: Not on file  Transportation Needs: Not on file   Physical Activity: Not on file  Stress: Not on file  Social Connections: Not on file   Family History  Problem Relation Age of Onset   Cancer Mother    Hypertension Sister    Diabetes Sister    Hypertension Brother    Diabetes Brother    Hypertension Sister    Diabetes Sister    Colon cancer Other    Heart Problems  Other 63       sister's child, open heart surgery   CVA Father    Diabetes Son    Hypertension Son    Kidney disease Son        on dialysis   Hypertension Sister    Diabetes Sister    Hypertension Sister    Diabetes Sister    Cancer Brother    Hypertension Son    Diabetes Son    Hypertension Daughter    Schizophrenia Daughter     ASSESSMENT Recent Results: The most recent result is correlated with 16 mg per week: Lab Results  Component Value Date   INR 2.9 03/15/2021   INR 4.1 (A) 03/01/2021   INR 2.3 02/01/2021    Anticoagulation Dosing: Description   Take only one-half (1/2) of your 4mg  strength blue warfarin tablet, once-daily at 6PM each day.      INR today: Therapeutic  PLAN Weekly dose was decreased by  25 % to 12 mg per week  Patient Instructions  Patient instructed to take medications as defined in the Anti-coagulation Track section of this encounter.  Patient instructed to take today's dose.  Patient instructed to take only one-half (1/2) of your 4mg  strength blue warfarin tablet, once-daily at [facility] each day.  Patient verbalized understanding of these instructions.   Patient advised to contact clinic or seek medical attention if signs/symptoms of bleeding or thromboembolism occur.  Patient verbalized understanding by repeating back information and was advised to contact me if further medication-related questions arise. Patient was also provided an information handout.  Follow-up Return in 4 weeks (on 04/12/2021) for Follow up INR.  Pennie Banter, PharmD, CPP  15 minutes spent face-to-face with the patient during the encounter. 50%  of time spent on education, including signs/sx bleeding and clotting, as well as food and drug interactions with warfarin. 50% of time was spent on fingerprick POC INR sample collection,processing, results determination, and documentation in [email or web address]

## 2021-03-15 NOTE — Progress Notes (Signed)
INTERNAL MEDICINE TEACHING ATTENDING ADDENDUM   I agree with pharmacy recommendations as outlined in their note.   Gerasimos Plotts, MD  

## 2021-03-27 ENCOUNTER — Other Ambulatory Visit: Payer: Self-pay | Admitting: Internal Medicine

## 2021-03-27 DIAGNOSIS — J301 Allergic rhinitis due to pollen: Secondary | ICD-10-CM

## 2021-04-12 ENCOUNTER — Ambulatory Visit (INDEPENDENT_AMBULATORY_CARE_PROVIDER_SITE_OTHER): Payer: Medicare Other | Admitting: Pharmacist

## 2021-04-12 DIAGNOSIS — Z7901 Long term (current) use of anticoagulants: Secondary | ICD-10-CM

## 2021-04-12 DIAGNOSIS — I82402 Acute embolism and thrombosis of unspecified deep veins of left lower extremity: Secondary | ICD-10-CM

## 2021-04-12 LAB — POCT INR: INR: 2 (ref 2.0–3.0)

## 2021-04-12 NOTE — Progress Notes (Signed)
Anticoagulation Management ?Caroline [name] is a 76 y.o. female who reports to the clinic for monitoring of warfarin treatment.   ? ?Indication: DVT , history of; long term current use of oral anticoagulant, warfarin--to maintain INR 2.0 - 3.0 target range. ?Duration: indefinite ?Supervising physician: Joni Reining ? ?Anticoagulation Clinic Visit History: ?Patient does not report signs/symptoms of bleeding or thromboembolism  ?Other recent changes: No diet, medications, lifestyle changes. ?Anticoagulation Episode Summary   ? ? Current INR goal:  2.0-3.0  ?TTR:  84.3 % (6.2 y)  ?Next INR check:  05/10/2021  ?INR from last check:  2.0 (04/12/2021)  ?Weekly max warfarin dose:    ?Target end date:    ?INR check location:  Anticoagulation Clinic  ?Preferred lab:    ?Send INR reminders to:  [facility]  ? Indications   ?DVT ?lower extremity ?recurrent ([place]) [I82.409] ?PULMONARY EMBOLISM ?HX OF (Resolved) [Z86.718] ? ?  ?  ? ? Comments:    ?  ? ?  ? ? ?Allergies  ?Allergen Reactions  ? Penicillins Anaphylaxis, Hives, Swelling and Rash  ?  Has patient had a PCN reaction causing immediate rash, facial/tongue/throat swelling, SOB or lightheadedness with hypotension: Yes ?Has patient had a PCN reaction causing severe rash involving mucus membranes or skin necrosis: Yes ?Has patient had a PCN reaction that required hospitalization Yes ?Has patient had a PCN reaction occurring within the last 10 years: No ?If all of the above answers are "NO", then may proceed with Cephalosporin use. ?  ? Cabbage Itching  ? Keflex [Cephalexin] Hives  ?  And feeling of throat tightness  ? Shellfish Allergy Swelling  ? Tomato Swelling  ? Latex Itching and Rash  ? ? ?Current Outpatient Medications:  ?  carvedilol (COREG) 12.5 MG tablet, Take 1 tablet (12.5 mg total) by mouth 2 (two) times daily with a meal., Disp: 180 tablet, Rfl: 1 ?  cholecalciferol (VITAMIN D) 25 MCG (1000 UNIT) tablet, TAKE 1 TABLET BY MOUTH EVERY DAY, Disp: 90 tablet, Rfl: 1 ?   fluticasone (FLONASE) 50 MCG/ACT nasal spray, PLACE 2 SPRAYS DAILY INTO BOTH NOSTRILS., Disp: 48 mL, Rfl: 1 ?  hydrocortisone (PROCTOSOL HC) 2.5 % rectal cream, Place 1 application rectally 2 (two) times daily., Disp: 30 g, Rfl: 0 ?  losartan-hydrochlorothiazide (HYZAAR) 100-25 MG tablet, Take 1 tablet by mouth daily., Disp: 90 tablet, Rfl: 1 ?  meclizine (ANTIVERT) 25 MG tablet, TAKE 1 TABLET BY MOUTH THREE TIMES A DAY AS NEEDED, Disp: 90 tablet, Rfl: 5 ?  metFORMIN (GLUCOPHAGE-XR) 500 MG 24 hr tablet, Take 1 tablet (500 mg total) by mouth 2 (two) times daily with a meal., Disp: 180 tablet, Rfl: 1 ?  montelukast (SINGULAIR) 10 MG tablet, TAKE 1 TABLET BY MOUTH EVERYDAY AT BEDTIME, Disp: 90 tablet, Rfl: 3 ?  pantoprazole (PROTONIX) 40 MG tablet, TAKE 1 TABLET BY MOUTH EVERY DAY, Disp: 90 tablet, Rfl: 1 ?  PROAIR HFA 108 (90 Base) MCG/ACT inhaler, TAKE 2 PUFFS BY MOUTH EVERY 6 HOURS AS NEEDED FOR WHEEZE OR SHORTNESS OF BREATH (Patient taking differently: Inhale 2 puffs into the lungs every 6 (six) hours as needed for wheezing or shortness of breath.), Disp: 8.5 Inhaler, Rfl: 2 ?  Psyllium (METAMUCIL PO), Take by mouth at bedtime. gummies, Disp: , Rfl:  ?  RESTASIS 0.05 % ophthalmic emulsion, INSTILL 1 DROP INTO BOTH EYES TWICE A DAY, Disp: 60 mL, Rfl: 1 ?  rosuvastatin (CRESTOR) 20 MG tablet, TAKE 1 TABLET BY MOUTH EVERY DAY,  Disp: 90 tablet, Rfl: 3 ?  SYMBICORT 160-4.5 MCG/ACT inhaler, TAKE 2 PUFFS BY MOUTH TWICE A DAY, Disp: 30.6 each, Rfl: 4 ?  warfarin (COUMADIN) 4 MG tablet, Take 1/2 tablet all days of the week, EXCEPT on Wednesdays and Saturdays, take one (1) tablet on these days., Disp: 60 tablet, Rfl: 1 ?  EPINEPHrine 0.3 mg/0.3 mL IJ SOAJ injection, Inject 0.3 mg into the muscle as needed. Severe allergic reactions (Patient not taking: Reported on 03/01/2021), Disp: , Rfl: 0 ?Past Medical History:  ?Diagnosis Date  ? Arthritis   ? Asthma   ? Bronchitis   ? CHF (congestive heart failure) ([place])   ? Diabetes  ([place]) 2019  ? Diverticulitis   ? DVT, lower extremity, recurrent ([place])   ? Patient had unprovoked PE on 2002 and DVT in right lower extremety 2008.  ? GERD (gastroesophageal reflux disease)   ? Hematuria, microscopic 09/13/2017  ? History of kidney stones   ? Hypertension   ? PE (pulmonary embolism)   ? Patient had unprovoked PE on 2002  ? PONV (postoperative nausea and vomiting)   ? Vertigo   ? ?Social History  ? ?Socioeconomic History  ? Marital status: Divorced  ?  Spouse name: Not on file  ? Number of children: 5  ? Years of education: 9 th grade  ? Highest education level: 9th grade  ?Occupational History  ? Occupation: retired  ?Tobacco Use  ? Smoking status: Former  ?  Types: Cigarettes  ?  Quit date: 09/18/1988  ?  Years since quitting: 32.5  ? Smokeless tobacco: Never  ? Tobacco comments:  ?  6 pack year smoking history as a teen  ?Vaping Use  ? Vaping Use: Never used  ?Substance and Sexual Activity  ? Alcohol use: No  ? Drug use: No  ? Sexual activity: Not Currently  ?  Birth control/protection: None  ?Other Topics Concern  ? Not on file  ?Social History Narrative  ? Current Social History 08/28/2019  ? Patient lives with 19 yo granddaughter in a one story home. There are not steps up to the entrance the patient uses.   ?   ? Patient's method of transportation is church member.  ?   ? The highest level of education was 9th grade  ?   ? The patient currently retired.  ?   ? Identified important Relationships are God, family   ?   ? Pets : 1 lab/pitt mix named Cinnamon  ?    ? Interests / Fun: Church,TV   ?   ? Current Stressors: "Me and my granddaughter"   ?   ? Religious / Personal Beliefs: I'm Holiness and I love people"   ?   ? Other: "I'd give away my last dime."   ?   ? L. Ducatte, BSN, RN-BC   ? ?Social Determinants of Health  ? ?Financial Resource Strain: Not on file  ?Food Insecurity: Not on file  ?Transportation Needs: Not on file  ?Physical Activity: Not on file  ?Stress: Not on file  ?Social  Connections: Not on file  ? ?Family History  ?Problem Relation Age of Onset  ? Cancer Mother   ? Hypertension Sister   ? Diabetes Sister   ? Hypertension Brother   ? Diabetes Brother   ? Hypertension Sister   ? Diabetes Sister   ? Colon cancer Other   ? Heart Problems Other 34  ?     sister's child, open heart  surgery  ? CVA Father   ? Diabetes Son   ? Hypertension Son   ? Kidney disease Son   ?     on dialysis  ? Hypertension Sister   ? Diabetes Sister   ? Hypertension Sister   ? Diabetes Sister   ? Cancer Brother   ? Hypertension Son   ? Diabetes Son   ? Hypertension Daughter   ? Schizophrenia Daughter   ? ? ?ASSESSMENT ?Recent Results: ?The most recent result is correlated with 14 mg per week: ?Lab Results  ?Component Value Date  ? INR 2.0 04/12/2021  ? INR 2.9 03/15/2021  ? INR 4.1 (A) 03/01/2021  ? ? ?Anticoagulation Dosing: ?Description   ?Take only one-half (1/2) of your 4mg  strength blue warfarin tablet, once-daily at 6PM each day--EXCEPT ON MONDAYS, take one (1) of your 4mg  strength blue warfarin tablets each Monday.  ?  ?  ?INR today: Therapeutic ? ?PLAN ?Weekly dose was increased by 13% to 16 mg per week ? ?Patient Instructions  ?Patient instructed to take medications as defined in the Anti-coagulation Track section of this encounter.  ?Patient instructed to take today's dose.  ?Patient instructed to take only one-half (1/2) of your 4mg  strength blue warfarin tablet, once-daily at 6PM each day--EXCEPT ON MONDAYS, take one (1) of your 4mg  strength blue warfarin tablets each Monday.  ?Patient verbalized understanding of these instructions.   ?Patient advised to contact clinic or seek medical attention if signs/symptoms of bleeding or thromboembolism occur. ? ?Patient verbalized understanding by repeating back information and was advised to contact me if further medication-related questions arise. Patient was also provided an information handout. ? ?Follow-up ?Return in 4 weeks (on 05/10/2021) for Follow up  INR. ? ?Pennie Banter, PharmD, CPP ? ?15 minutes spent face-to-face with the patient during the encounter. 50% of time spent on education, including signs/sx bleeding and clotting, as well as food and drug interaction

## 2021-04-12 NOTE — Patient Instructions (Signed)
Patient instructed to take medications as defined in the Anti-coagulation Track section of this encounter.  ?Patient instructed to take today's dose.  ?Patient instructed to take only one-half (1/2) of your 4mg  strength blue warfarin tablet, once-daily at 6PM each day--EXCEPT ON MONDAYS, take one (1) of your 4mg  strength blue warfarin tablets each Monday.  ?Patient verbalized understanding of these instructions.   ?

## 2021-04-13 NOTE — Progress Notes (Signed)
Evaluation and management procedures were performed by the Clinical Pharmacy Practitioner under my supervision and collaboration. I have reviewed the Practitioner's note and chart, and I agree with the management and plan as documented above. ° °

## 2021-05-10 ENCOUNTER — Ambulatory Visit (INDEPENDENT_AMBULATORY_CARE_PROVIDER_SITE_OTHER): Payer: Medicare Other | Admitting: Pharmacist

## 2021-05-10 ENCOUNTER — Other Ambulatory Visit: Payer: Self-pay | Admitting: Internal Medicine

## 2021-05-10 DIAGNOSIS — I82402 Acute embolism and thrombosis of unspecified deep veins of left lower extremity: Secondary | ICD-10-CM

## 2021-05-10 DIAGNOSIS — Z7901 Long term (current) use of anticoagulants: Secondary | ICD-10-CM | POA: Diagnosis not present

## 2021-05-10 DIAGNOSIS — K219 Gastro-esophageal reflux disease without esophagitis: Secondary | ICD-10-CM

## 2021-05-10 LAB — POCT INR: INR: 2 (ref 2.0–3.0)

## 2021-05-10 NOTE — Progress Notes (Signed)
Anticoagulation Management ?Caroline [name] is a 76 y.o. female who reports to the clinic for monitoring of warfarin treatment.   ? ?Indication: DVT ,  history of LLE VTE; long term current use of oral anticoagulant warfarin with target INR 2.0 - 3.0.  ?Duration: indefinite ?Supervising physician:  Reymundo Poll, MD ? ?Anticoagulation Clinic Visit History: ?Patient does not report signs/symptoms of bleeding or thromboembolism  ?Other recent changes: No diet, medications, lifestyle changes endorsed by the patient at this visit.  ?Anticoagulation Episode Summary   ? ? Current INR goal:  2.0-3.0  ?TTR:  84.5 % (6.2 y)  ?Next INR check:  05/24/2021  ?INR from last check:    ?Weekly max warfarin dose:    ?Target end date:    ?INR check location:  Anticoagulation Clinic  ?Preferred lab:    ?Send INR reminders to:  [facility]  ? Indications   ?DVT ?lower extremity ?recurrent (HCC) [I82.409] ?PULMONARY EMBOLISM ?HX OF (Resolved) [Z86.718] ? ?  ?  ? ? Comments:    ?  ? ?  ? ? ?Allergies  ?Allergen Reactions  ? Penicillins Anaphylaxis, Hives, Swelling and Rash  ?  Has patient had a PCN reaction causing immediate rash, facial/tongue/throat swelling, SOB or lightheadedness with hypotension: Yes ?Has patient had a PCN reaction causing severe rash involving mucus membranes or skin necrosis: Yes ?Has patient had a PCN reaction that required hospitalization Yes ?Has patient had a PCN reaction occurring within the last 10 years: No ?If all of the above answers are "NO", then may proceed with Cephalosporin use. ?  ? Cabbage Itching  ? Keflex [Cephalexin] Hives  ?  And feeling of throat tightness  ? Shellfish Allergy Swelling  ? Tomato Swelling  ? Latex Itching and Rash  ? ? ?Current Outpatient Medications:  ?  carvedilol (COREG) 12.5 MG tablet, Take 1 tablet (12.5 mg total) by mouth 2 (two) times daily with a meal., Disp: 180 tablet, Rfl: 1 ?  cholecalciferol (VITAMIN D) 25 MCG (1000 UNIT) tablet, TAKE 1 TABLET BY MOUTH EVERY DAY,  Disp: 90 tablet, Rfl: 1 ?  fluticasone (FLONASE) 50 MCG/ACT nasal spray, PLACE 2 SPRAYS DAILY INTO BOTH NOSTRILS., Disp: 48 mL, Rfl: 1 ?  hydrocortisone (PROCTOSOL HC) 2.5 % rectal cream, Place 1 application rectally 2 (two) times daily., Disp: 30 g, Rfl: 0 ?  losartan-hydrochlorothiazide (HYZAAR) 100-25 MG tablet, Take 1 tablet by mouth daily., Disp: 90 tablet, Rfl: 1 ?  meclizine (ANTIVERT) 25 MG tablet, TAKE 1 TABLET BY MOUTH THREE TIMES A DAY AS NEEDED, Disp: 90 tablet, Rfl: 5 ?  metFORMIN (GLUCOPHAGE-XR) 500 MG 24 hr tablet, Take 1 tablet (500 mg total) by mouth 2 (two) times daily with a meal., Disp: 180 tablet, Rfl: 1 ?  montelukast (SINGULAIR) 10 MG tablet, TAKE 1 TABLET BY MOUTH EVERYDAY AT BEDTIME, Disp: 90 tablet, Rfl: 3 ?  pantoprazole (PROTONIX) 40 MG tablet, TAKE 1 TABLET BY MOUTH EVERY DAY, Disp: 90 tablet, Rfl: 1 ?  PROAIR HFA 108 (90 Base) MCG/ACT inhaler, TAKE 2 PUFFS BY MOUTH EVERY 6 HOURS AS NEEDED FOR WHEEZE OR SHORTNESS OF BREATH (Patient taking differently: Inhale 2 puffs into the lungs every 6 (six) hours as needed for wheezing or shortness of breath.), Disp: 8.5 Inhaler, Rfl: 2 ?  Psyllium (METAMUCIL PO), Take by mouth at bedtime. gummies, Disp: , Rfl:  ?  RESTASIS 0.05 % ophthalmic emulsion, INSTILL 1 DROP INTO BOTH EYES TWICE A DAY, Disp: 60 mL, Rfl: 1 ?  rosuvastatin (CRESTOR) 20 MG tablet, TAKE 1 TABLET BY MOUTH EVERY DAY, Disp: 90 tablet, Rfl: 3 ?  SYMBICORT 160-4.5 MCG/ACT inhaler, TAKE 2 PUFFS BY MOUTH TWICE A DAY, Disp: 30.6 each, Rfl: 4 ?  warfarin (COUMADIN) 4 MG tablet, Take 1/2 tablet all days of the week, EXCEPT on Wednesdays and Saturdays, take one (1) tablet on these days., Disp: 60 tablet, Rfl: 1 ?  EPINEPHrine 0.3 mg/0.3 mL IJ SOAJ injection, Inject 0.3 mg into the muscle as needed. Severe allergic reactions (Patient not taking: Reported on 03/01/2021), Disp: , Rfl: 0 ?Past Medical History:  ?Diagnosis Date  ? Arthritis   ? Asthma   ? Bronchitis   ? CHF (congestive heart  failure) ([place])   ? Diabetes ([place]) 2019  ? Diverticulitis   ? DVT, lower extremity, recurrent ([place])   ? Patient had unprovoked PE on 2002 and DVT in right lower extremety 2008.  ? GERD (gastroesophageal reflux disease)   ? Hematuria, microscopic 09/13/2017  ? History of kidney stones   ? Hypertension   ? PE (pulmonary embolism)   ? Patient had unprovoked PE on 2002  ? PONV (postoperative nausea and vomiting)   ? Vertigo   ? ?Social History  ? ?Socioeconomic History  ? Marital status: Divorced  ?  Spouse name: Not on file  ? Number of children: 5  ? Years of education: 9 th grade  ? Highest education level: 9th grade  ?Occupational History  ? Occupation: retired  ?Tobacco Use  ? Smoking status: Former  ?  Types: Cigarettes  ?  Quit date: 09/18/1988  ?  Years since quitting: 32.6  ? Smokeless tobacco: Never  ? Tobacco comments:  ?  6 pack year smoking history as a teen  ?Vaping Use  ? Vaping Use: Never used  ?Substance and Sexual Activity  ? Alcohol use: No  ? Drug use: No  ? Sexual activity: Not Currently  ?  Birth control/protection: None  ?Other Topics Concern  ? Not on file  ?Social History Narrative  ? Current Social History 08/28/2019  ? Patient lives with 67 yo granddaughter in a one story home. There are not steps up to the entrance the patient uses.   ?   ? Patient's method of transportation is church member.  ?   ? The highest level of education was 9th grade  ?   ? The patient currently retired.  ?   ? Identified important Relationships are God, family   ?   ? Pets : 1 lab/pitt mix named Cinnamon  ?    ? Interests / Fun: Church,TV   ?   ? Current Stressors: "Me and my granddaughter"   ?   ? Religious / Personal Beliefs: I'm Holiness and I love people"   ?   ? Other: "I'd give away my last dime."   ?   ? L. [place], BSN, RN-BC   ? ?Social Determinants of Health  ? ?Financial Resource Strain: Not on file  ?Food Insecurity: Not on file  ?Transportation Needs: Not on file  ?Physical Activity: Not on file  ?Stress:  Not on file  ?Social Connections: Not on file  ? ?Family History  ?Problem Relation Age of Onset  ? Cancer Mother   ? Hypertension Sister   ? Diabetes Sister   ? Hypertension Brother   ? Diabetes Brother   ? Hypertension Sister   ? Diabetes Sister   ? Colon cancer Other   ? Heart Problems  Other 47  ?     sister's child, open heart surgery  ? CVA Father   ? Diabetes Son   ? Hypertension Son   ? Kidney disease Son   ?     on dialysis  ? Hypertension Sister   ? Diabetes Sister   ? Hypertension Sister   ? Diabetes Sister   ? Cancer Brother   ? Hypertension Son   ? Diabetes Son   ? Hypertension Daughter   ? Schizophrenia Daughter   ? ? ?ASSESSMENT ?Recent Results: ?The most recent result is correlated with 16 mg per week: ?Lab Results  ?Component Value Date  ? INR 2.0 05/10/2021  ? INR 2.0 04/12/2021  ? INR 2.9 03/15/2021  ? ? ?Anticoagulation Dosing: ?Description   ?Take only one-half (1/2) of your 4mg  strength blue warfarin tablet, once-daily at 6PM each day--EXCEPT ON MONDAYS, [place],  take one (1) of your 4mg  strength blue warfarin tablets on these days.  ?  ?  ?INR today: Therapeutic ? ?PLAN ?Weekly dose was increased by  25 % to 20 mg per week ? ?Patient Instructions  ?Patient instructed to take medications as defined in the Anti-coagulation Track section of this encounter.  ?Patient instructed to take today's dose.  ?Patient instructed to take only one-half (1/2) of your 4mg  strength blue warfarin tablet, once-daily at 6PM each day--EXCEPT ON MONDAYS, [place],  take one (1) of your 4mg  strength blue warfarin tablets on these days.  ?Patient verbalized understanding of these instructions.   ?Patient advised to contact clinic or seek medical attention if signs/symptoms of bleeding or thromboembolism occur. ? ?Patient verbalized understanding by repeating back information and was advised to contact me if further medication-related questions arise. Patient was also provided an  information handout. ? ?Follow-up ?Return in 2 weeks (on 05/24/2021) for Follow up INR. ? ?Pennie Banter, PharmD, CPP ? ?15 minutes spent face-to-face with the patient during the encounter. 50% of time spent on educat

## 2021-05-10 NOTE — Patient Instructions (Signed)
Patient instructed to take medications as defined in the Anti-coagulation Track section of this encounter.  Patient instructed to take today's dose.  Patient instructed to take only one-half (1/2) of your 4mg strength blue warfarin tablet, once-daily at 6PM each day--EXCEPT ON MONDAYS, WEDNESDAYS AND FRIDAYS,  take one (1) of your 4mg strength blue warfarin tablets on these days.  Patient verbalized understanding of these instructions.   

## 2021-05-10 NOTE — Telephone Encounter (Signed)
Next appt scheduled 05/18/21 with PCP. ?

## 2021-05-13 NOTE — Progress Notes (Signed)
INTERNAL MEDICINE TEACHING ATTENDING ADDENDUM   I agree with pharmacy recommendations as outlined in their note.   Hy Swiatek, MD  

## 2021-05-18 ENCOUNTER — Ambulatory Visit (INDEPENDENT_AMBULATORY_CARE_PROVIDER_SITE_OTHER): Payer: Medicare Other | Admitting: Internal Medicine

## 2021-05-18 ENCOUNTER — Encounter: Payer: Self-pay | Admitting: Internal Medicine

## 2021-05-18 VITALS — BP 138/65 | HR 77 | Temp 98.1°F | Ht 62.0 in | Wt 274.4 lb

## 2021-05-18 DIAGNOSIS — Z87891 Personal history of nicotine dependence: Secondary | ICD-10-CM

## 2021-05-18 DIAGNOSIS — Z6841 Body Mass Index (BMI) 40.0 and over, adult: Secondary | ICD-10-CM

## 2021-05-18 DIAGNOSIS — Z7901 Long term (current) use of anticoagulants: Secondary | ICD-10-CM | POA: Diagnosis not present

## 2021-05-18 DIAGNOSIS — I5042 Chronic combined systolic (congestive) and diastolic (congestive) heart failure: Secondary | ICD-10-CM

## 2021-05-18 DIAGNOSIS — I110 Hypertensive heart disease with heart failure: Secondary | ICD-10-CM | POA: Diagnosis not present

## 2021-05-18 DIAGNOSIS — Z7984 Long term (current) use of oral hypoglycemic drugs: Secondary | ICD-10-CM | POA: Diagnosis not present

## 2021-05-18 DIAGNOSIS — I82409 Acute embolism and thrombosis of unspecified deep veins of unspecified lower extremity: Secondary | ICD-10-CM | POA: Diagnosis not present

## 2021-05-18 DIAGNOSIS — I10 Essential (primary) hypertension: Secondary | ICD-10-CM

## 2021-05-18 DIAGNOSIS — Z0000 Encounter for general adult medical examination without abnormal findings: Secondary | ICD-10-CM

## 2021-05-18 DIAGNOSIS — E118 Type 2 diabetes mellitus with unspecified complications: Secondary | ICD-10-CM

## 2021-05-18 LAB — POCT GLYCOSYLATED HEMOGLOBIN (HGB A1C): Hemoglobin A1C: 6.9 % — AB (ref 4.0–5.6)

## 2021-05-18 LAB — GLUCOSE, CAPILLARY: Glucose-Capillary: 161 mg/dL — ABNORMAL HIGH (ref 70–99)

## 2021-05-18 MED ORDER — MOUNJARO 2.5 MG/0.5ML ~~LOC~~ SOAJ: 2.5000 mg | SUBCUTANEOUS | 0 refills | Status: DC

## 2021-05-18 NOTE — Assessment & Plan Note (Addendum)
Patient has chronic CHF but has been doing well. On exam today, heart and lungs were normal. She denies chest pain, and there is no evidence of fluid in the lungs or peripheral edema. She denies shortness of breath. She said she would like to see her cardiologist to make sure everything is still alright. She said she has an appointment in June, but because she has not been this year, referral placed just in case she needs one. She has continued on her medications as prescribed and reports no issues. ? ?Plan ?- Referral to cardiology ?- Continue losartan-HCTZ 100-25mg  daily ?- Continue carvedilol 12.5mg  BID ?

## 2021-05-18 NOTE — Assessment & Plan Note (Signed)
Patient A1c today 6.9, so she is at goal < 7. Because A1c is more borderline and patient expressed interest in weight loss, planning to start Mounjaro. Had shared decision making discussion comparing SGLT-2 inhibitor, which would help with diabetes and heart failure, to GLP-1 agonists, which help with diabetes and weight loss, and patient will try the injection. Checking with Dr. Alexandria Lodge to make sure there will be no interference with her Warfarin dosing, but told her more frequent PT-INR checks may be needed initially. Referred her to [name], who will teach patient to inject herself, and they can also discuss healthy eating habits as patient works to achieve weight loss. Will continue Metformin and also encouraged patient to continue lifestyle changes she has started. She is limiting her sweets and is excited that she will be exercising with her daughter at [employer] this upcoming summer. ? ?Plan ?- Pick up Mounjaro from pharmacy ?- Schedule follow-up with [name] in ~1 week ?- Start Mounjaro 2.5mg /0.68mL injections weekly after teaching with [name] ?- Continue Metformin 500mg  BID with meals ?- Continue diet and lifestyle changes ?- Repeat A1c in 3 months ?

## 2021-05-18 NOTE — Assessment & Plan Note (Addendum)
Patient expresses interest in weight loss. She is planning to go to [employer] with her daughter to exercise this summer. Because A1c is more borderline and patient expressed interest in weight loss, planning to start Mounjaro. Referred her to [name], who will teach patient to inject herself, and they can also discuss healthy eating habits as patient works to achieve weight loss. ? ?Plan ?- Pick up Mounjaro from pharmacy ?- Schedule follow-up with [name] in ~1 week ?- Start Mounjaro 2.5mg /0.69mL injections weekly after teaching with [name] ?- Continue diet and lifestyle changes ? ?

## 2021-05-18 NOTE — Assessment & Plan Note (Signed)
Patient has a history of recurrent DVTs and has been doing well on chronic coumadin therapy. Patient has been at goal for recent INR checks between 2-3. On 03/01/2021, value was 4.1, and that was the last time the value has been out of range. She has no signs of bruising or bleeding on exam. Expressed some concern that Mounjaro injections may cause some bruising, so told patient to let us know if this develops as a side effect. Also told her we have contacted Dr. Alexandria Lodge before starting this medication. Mentioned that there may be more frequent INR checks when starting Mounjaro. ? ?Plan ?- Follow-up conversation with Dr. Alexandria Lodge ?- Continue monitoring PT-INR as scheduled ?

## 2021-05-18 NOTE — Assessment & Plan Note (Signed)
Patient blood pressure well-controlled on losartan-HCTZ and carvedilol. In office reading was 138/65. Encouraged her that weight loss will additionally improve her blood pressure. She denies headaches, chest pain, and palpitations. She endorsed dizziness when she stands too fast, but she says this occurs less than once per month. ? ? ?Plan ?- Continue losartan-HCTZ 100-25mg  daily ?- Continue carvedilol 12.5mg  BID ?

## 2021-05-18 NOTE — Patient Instructions (Signed)
It was very nice to see you in clinic today. Here are a few reminders from what we discussed: ? ?We would like to start you on the medication Mounjaro. This medication will be helpful for diabetes and weight loss. We have included a handout with more information. ?We would like for you to schedule a follow-up appointment with Caroline [name], our Diabetes Educator, in 1 week. She can discuss healthy diet, but she can also teach you how to give yourself the Mounjaro injections. Please pick up Mounjaro from your pharmacy and bring it to your appointment with her. ?We recommend you get a colonoscopy because your stool test was positive in 2021.  ?We are glad your heart is doing so well! We will place a referral for you to go back to see your cardiologist. ?We are placing a referral for a mammogram later this year. ?Enjoy your exercise with your daughter this summer! ? ?Please follow-up with Caroline [name] in 1 week, and please follow-up for an appointment in 3 months! If you have any questions or concerns before your next appointment, please let us know. ? ?Tirzepatide Injection ?What is this medication? ?TIRZEPATIDE (tir ZEP a tide) treats type 2 diabetes. It works by increasing insulin levels in your body, which decreases your blood sugar (glucose). Changes to diet and exercise are often combined with this medication. ?This medicine may be used for other purposes; ask your health care provider or pharmacist if you have questions. ?COMMON BRAND NAME(S): MOUNJARO ?What should I tell my care team before I take this medication? ?They need to know if you have any of these conditions: ?Endocrine tumors (MEN 2) or if someone in your family had these tumors ?Eye disease, vision problems ?Gallbladder disease ?History of pancreatitis ?Kidney disease ?Stomach or intestine problems ?Thyroid cancer or if someone in your family had thyroid cancer ?An unusual or allergic reaction to tirzepatide, other medications, foods, dyes, or  preservatives ?Pregnant or trying to get pregnant ?Breast-feeding ?How should I use this medication? ?This medication is injected under the skin. You will be taught how to prepare and give it. It is given once every week (every 7 days). Keep taking it unless your health care provider tells you to stop. ?If you use this medication with insulin, you should inject this medication and the insulin separately. Do not mix them together. Do not give the injections right next to each other. Change (rotate) injection sites with each injection. ?This medication comes with INSTRUCTIONS FOR USE. Ask your pharmacist for directions on how to use this medication. Read the information carefully. Talk to your pharmacist or care team if you have questions. ?It is important that you put your used needles and syringes in a special sharps container. Do not put them in a trash can. If you do not have a sharps container, call your pharmacist or care team to get one. ?A special MedGuide will be given to you by the pharmacist with each prescription and refill. Be sure to read this information carefully each time. ?Talk to your care team about the use of this medication in children. Special care may be needed. ?Overdosage: If you think you have taken too much of this medicine contact a poison control center or emergency room at once. ?NOTE: This medicine is only for you. Do not share this medicine with others. ?What if I miss a dose? ?If you miss a dose, take it as soon as you can unless it is more than 4 days (96 hours) late. If  it is more than 4 days late, skip the missed dose. Take the next dose at the normal time. Do not take 2 doses within 3 days of each other. ?What may interact with this medication? ?Alcohol ?Antiviral medications for HIV or AIDS ?Aspirin and aspirin-like medications ?Beta blockers, such as atenolol, metoprolol, propranolol ?Certain medications for blood pressure, heart disease, irregular heart  beat ?Chromium ?Clonidine ?Diuretics ?Estrogen or progestin hormones ?Fenofibrate ?Gemfibrozil ?Guanethidine ?Isoniazid ?Lanreotide ?Female hormones or anabolic steroids ?MAOIs, such as Marplan, Nardil, and Parnate ?Medications for weight loss ?Medications for allergies, asthma, cold, or cough ?Medications for depression, anxiety, or mental health conditions ?Niacin ?Nicotine ?NSAIDs, medications for pain and inflammation, such as ibuprofen or naproxen ?Octreotide ?Other medications for diabetes, such as glyburide, glipizide, or glimepiride ?Pasireotide ?Pentamidine ?Phenytoin ?Probenecid ?Quinolone antibiotics, such as ciprofloxacin, levofloxacin, ofloxacin ?Reserpine ?Some herbal dietary supplements ?Steroid medications, such as prednisone or cortisone ?Sulfamethoxazole; trimethoprim ?Thyroid hormones ?This list may not describe all possible interactions. Give your health care provider a list of all the medicines, herbs, non-prescription drugs, or dietary supplements you use. Also tell them if you smoke, drink alcohol, or use illegal drugs. Some items may interact with your medicine. ?What should I watch for while using this medication? ?Visit your care team for regular checks on your progress. ?Drink plenty of fluids while taking this medication. Check with your care team if you get an attack of severe diarrhea, nausea, and vomiting. The loss of too much body fluid can make it dangerous for you to take this medication. ?A test called the HbA1C (A1C) will be monitored. This is a simple blood test. It measures your blood sugar control over the last 2 to 3 months. You will receive this test every 3 to 6 months. ?Learn how to check your blood sugar. Learn the symptoms of low and high blood sugar and how to manage them. ?Always carry a quick-source of sugar with you in case you have symptoms of low blood sugar. Examples include hard sugar candy or glucose tablets. Make sure others know that you can choke if you eat or  drink when you develop serious symptoms of low blood sugar, such as seizures or unconsciousness. They must get medical help at once. ?Tell your care team if you have high blood sugar. You might need to change the dose of your medication. If you are sick or exercising more than usual, you might need to change the dose of your medication. ?Do not skip meals. Ask your care team if you should avoid alcohol. Many nonprescription cough and cold products contain sugar or alcohol. These can affect blood sugar. ?Pens should never be shared. Even if the needle is changed, sharing may result in passing of viruses like hepatitis or HIV. ?Wear a medical ID bracelet or chain, and carry a card that describes your disease and details of your medication and dosage times. ?Birth control may not work properly while you are taking this medication. If you take birth control pills by mouth, your care team may recommend another type of birth control for 4 weeks after you start this medication and for 4 weeks after each increase in your dose of this medication. Ask your care team which birth control methods you should use. ?What side effects may I notice from receiving this medication? ?Side effects that you should report to your care team as soon as possible: ?Allergic reactions--skin rash, itching, hives, swelling of the face, lips, tongue, or throat ?Change in vision ?Dehydration--increased thirst,  dry mouth, feeling faint or lightheaded, headache, dark yellow or brown urine ?Gallbladder problems--severe stomach pain, nausea, vomiting, fever ?Kidney injury--decrease in the amount of urine, swelling of the ankles, hands, or feet ?Pancreatitis--severe stomach pain that spreads to your back or gets worse after eating or when touched, fever, nausea, vomiting ?Thyroid cancer--new mass or lump in the neck, pain or trouble swallowing, trouble breathing, hoarseness ?Side effects that usually do not require medical attention (report these to  your care team if they continue or are bothersome): ?Constipation ?Diarrhea ?Loss of appetite ?Nausea ?Stomach pain ?Upset stomach ?Vomiting ?This list may not describe all possible side effects. Call your doctor for medical advice about side effec

## 2021-05-18 NOTE — Progress Notes (Signed)
? ?Subjective:  ? ?Patient ID: Caroline [name] female   DOB: Jan 29, 1945 76 y.o.   MRN: [id number] ? ?HPI: ?[name] is a 76 y.o. 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 See problem-based charting for full assessment and plan. ? ?Past Medical History:  ?Diagnosis Date  ? Arthritis   ? Asthma   ? Bronchitis   ? CHF (congestive heart failure) ([place])   ? Diabetes ([place]) 2019  ? Diverticulitis   ? DVT, lower extremity, recurrent ([place])   ? Patient had unprovoked PE on 2002 and DVT in right lower extremety 2008.  ? Frequent urination 03/21/2019  ? GERD (gastroesophageal reflux disease)   ? Hematuria, microscopic 09/13/2017  ? History of kidney stones   ? Hypertension   ? Lower abdominal pain 02/20/2018  ? PE (pulmonary embolism)   ? Patient had unprovoked PE on 2002  ? PONV (postoperative nausea and vomiting)   ? Vertigo   ? ?Current Outpatient Medications  ?Medication Sig Dispense Refill  ? tirzepatide (MOUNJARO) 2.5 MG/0.5ML Pen Inject 2.5 mg into the skin once a week. 2 mL 0  ? carvedilol (COREG) 12.5 MG tablet Take 1 tablet (12.5 mg total) by mouth 2 (two) times daily with a meal. 180 tablet 1  ? cholecalciferol (VITAMIN D) 25 MCG (1000 UNIT) tablet TAKE 1 TABLET BY MOUTH EVERY DAY 90 tablet 1  ? EPINEPHrine 0.3 mg/0.3 mL IJ SOAJ injection Inject 0.3 mg into the muscle as needed. Severe allergic reactions (Patient not taking: Reported on 03/01/2021)  0  ? fluticasone (FLONASE) 50 MCG/ACT nasal spray PLACE 2 SPRAYS DAILY INTO BOTH NOSTRILS. 48 mL 1  ? hydrocortisone (PROCTOSOL HC) 2.5 % rectal cream Place 1 application rectally 2 (two) times daily. 30 g 0  ? losartan-hydrochlorothiazide (HYZAAR) 100-25 MG tablet Take 1 tablet by mouth daily. 90 tablet 1  ? meclizine (ANTIVERT) 25 MG tablet TAKE 1 TABLET BY MOUTH THREE TIMES A DAY AS NEEDED 90 tablet 5  ? metFORMIN (GLUCOPHAGE-XR) 500 MG 24 hr tablet Take 1 tablet (500 mg total) by mouth 2 (two) times daily with a meal.  180 tablet 1  ? montelukast (SINGULAIR) 10 MG tablet TAKE 1 TABLET BY MOUTH EVERYDAY AT BEDTIME 90 tablet 3  ? pantoprazole (PROTONIX) 40 MG tablet TAKE 1 TABLET BY MOUTH EVERY DAY 90 tablet 1  ? PROAIR HFA 108 (90 Base) MCG/ACT inhaler TAKE 2 PUFFS BY MOUTH EVERY 6 HOURS AS NEEDED FOR WHEEZE OR SHORTNESS OF BREATH (Patient taking differently: Inhale 2 puffs into the lungs every 6 (six) hours as needed for wheezing or shortness of breath.) 8.5 Inhaler 2  ? Psyllium (METAMUCIL PO) Take by mouth at bedtime. gummies    ? RESTASIS 0.05 % ophthalmic emulsion INSTILL 1 DROP INTO BOTH EYES TWICE A DAY 60 mL 1  ? rosuvastatin (CRESTOR) 20 MG tablet TAKE 1 TABLET BY MOUTH EVERY DAY 90 tablet 3  ? SYMBICORT 160-4.5 MCG/ACT inhaler TAKE 2 PUFFS BY MOUTH TWICE A DAY 30.6 each 4  ? warfarin (COUMADIN) 4 MG tablet Take 1/2 tablet all days of the week, EXCEPT on Wednesdays and Saturdays, take one (1) tablet on these days. 60 tablet 1  ? ?No current facility-administered medications for this visit.  ? ?Family History  ?Problem Relation Age of Onset  ? Cancer Mother   ? Hypertension Sister   ? Diabetes Sister   ? Hypertension Brother   ? Diabetes Brother   ?  Hypertension Sister   ? Diabetes Sister   ? Colon cancer Other   ? Heart Problems Other 34  ?     sister's child, open heart surgery  ? CVA Father   ? Diabetes Son   ? Hypertension Son   ? Kidney disease Son   ?     on dialysis  ? Hypertension Sister   ? Diabetes Sister   ? Hypertension Sister   ? Diabetes Sister   ? Cancer Brother   ? Hypertension Son   ? Diabetes Son   ? Hypertension Daughter   ? Schizophrenia Daughter   ? ?Social History  ? ?Socioeconomic History  ? Marital status: Divorced  ?  Spouse name: Not on file  ? Number of children: 5  ? Years of education: 9 th grade  ? Highest education level: 9th grade  ?Occupational History  ? Occupation: retired  ?Tobacco Use  ? Smoking status: Former  ?  Types: Cigarettes  ?  Quit date: 09/18/1988  ?  Years since quitting: 32.6   ? Smokeless tobacco: Never  ? Tobacco comments:  ?  6 pack year smoking history as a teen  ?Vaping Use  ? Vaping Use: Never used  ?Substance and Sexual Activity  ? Alcohol use: No  ? Drug use: No  ? Sexual activity: Not Currently  ?  Birth control/protection: None  ?Other Topics Concern  ? Not on file  ?Social History Narrative  ? Current Social History 08/28/2019  ? Patient lives with 28 yo granddaughter in a one story home. There are not steps up to the entrance the patient uses.   ?   ? Patient's method of transportation is church member.  ?   ? The highest level of education was 9th grade  ?   ? The patient currently retired.  ?   ? Identified important Relationships are God, family   ?   ? Pets : 1 lab/pitt mix named Cinnamon  ?    ? Interests / Fun: Church,TV   ?   ? Current Stressors: "Me and my granddaughter"   ?   ? Religious / Personal Beliefs: I'm Holiness and I love people"   ?   ? Other: "I'd give away my last dime."   ?   ? L. Ducatte, BSN, RN-BC   ? ?Social Determinants of Health  ? ?Financial Resource Strain: Not on file  ?Food Insecurity: Not on file  ?Transportation Needs: Not on file  ?Physical Activity: Not on file  ?Stress: Not on file  ?Social Connections: Not on file  ? ?Review of Systems: ?Pertinent items are noted in HPI. ?Objective:  ?Physical Exam: ?Vitals:  ? 05/18/21 1025  ?BP: 138/65  ?Pulse: 77  ?Temp: 98.1 ?F (36.7 ?C)  ?TempSrc: Oral  ?SpO2: 99%  ?Weight: 274 lb 6.4 oz (124.5 kg)  ?Height: 5\' 2"  (1.575 m)  ? ?BP 138/65 (BP Location: Right Arm, Patient Position: Sitting, Cuff Size: Normal)   Pulse 77   Temp 98.1 ?F (36.7 ?C) (Oral)   Ht 5\' 2"  (1.575 m)   Wt 274 lb 6.4 oz (124.5 kg)   SpO2 99%   BMI 50.19 kg/m?  ? ?General Appearance:    Alert, cooperative, no distress  ?Head:    Normocephalic, atraumatic  ?Lungs:     Clear to auscultation bilaterally, respirations unlabored  ? Heart:    Regular rate and rhythm, S1 and S2 normal, no murmur, rub   or gallop  ?Abdomen:  Soft,  non-tender, bowel sounds active all four quadrants  ?Extremities:   Extremities normal, atraumatic, no cyanosis or edema  ?Skin:   Skin color, texture, turgor normal, no rashes or lesions  ?Lymph nodes:   Cervical, supraclavicular, and axillary nodes normal  ? ?Assessment & Plan:  ? ?See encounters tab for problem-based charting. ? ?Type 2 diabetes mellitus with complication, without long-term current use of insulin ([place]) ?Patient A1c today 6.9, so she is at goal < 7. Because A1c is more borderline and patient expressed interest in weight loss, planning to start Mounjaro. Had shared decision making discussion comparing SGLT-2 inhibitor, which would help with diabetes and heart failure, to GLP-1 agonists, which help with diabetes and weight loss, and patient will try the injection. Checking with Dr. Elie Confer to make sure there will be no interference with her Warfarin dosing, but told her more frequent PT-INR checks may be needed initially. Referred her to Butch Penny, who will teach patient to inject herself, and they can also discuss healthy eating habits as patient works to achieve weight loss. Will continue Metformin and also encouraged patient to continue lifestyle changes she has started. She is limiting her sweets and is excited that she will be exercising with her daughter at [employer] this upcoming summer. ? ?Plan ?- Pick up Mounjaro from pharmacy ?- Schedule follow-up with Butch Penny in ~1 week ?- Start Mounjaro 2.5mg /0.82mL injections weekly after teaching with Butch Penny ?- Continue Metformin 500mg  BID with meals ?- Continue diet and lifestyle changes ?- Repeat A1c in 3 months ? ?DVT, lower extremity, recurrent ([place]) ?Patient has a history of recurrent DVTs and has been doing well on chronic coumadin therapy. Patient has been at goal for recent INR checks between 2-3. On 03/01/2021, value was 4.1, and that was the last time the value has been out of range. She has no signs of bruising or bleeding on exam. Expressed some  concern that Mounjaro injections may cause some bruising, so told patient to let us know if this develops as a side effect. Also told her we have contacted Dr. Elie Confer before starting this medication. Mentioned th

## 2021-05-18 NOTE — Assessment & Plan Note (Addendum)
Patient had + FOBT in 09/2019. Referral was placed to GI for colonoscopy previously, but patient says she did not go because she does not want the procedure. Discussed its importance of further testing given + FOBT previously, and she expressed understanding. She also will be due for a mammogram in July, so referral placed. ? ?Plan ?- Referral to GI ?- Mammogram ordered ?

## 2021-05-19 NOTE — Progress Notes (Signed)
Attestation for Student Documentation: ? ?I personally was present and performed or re-performed the history, physical exam and medical decision-making activities of this service and have verified that the service and findings are accurately documented in the student?s note. ? ?Aldine Contes, MD ?05/19/2021, 4:40 PM ? ?

## 2021-05-24 ENCOUNTER — Ambulatory Visit (INDEPENDENT_AMBULATORY_CARE_PROVIDER_SITE_OTHER): Payer: Medicare Other | Admitting: Pharmacist

## 2021-05-24 DIAGNOSIS — Z7901 Long term (current) use of anticoagulants: Secondary | ICD-10-CM | POA: Diagnosis not present

## 2021-05-24 DIAGNOSIS — I82402 Acute embolism and thrombosis of unspecified deep veins of left lower extremity: Secondary | ICD-10-CM

## 2021-05-24 LAB — POCT INR: INR: 2.2 (ref 2.0–3.0)

## 2021-05-24 NOTE — Patient Instructions (Signed)
Patient instructed to take medications as defined in the Anti-coagulation Track section of this encounter.  ?Patient instructed to take today's dose.  ?Patient instructed to take one-half (1/2) of your 4mg  strength blue warfarin tablet, once-daily at 6PM each day--EXCEPT ON MONDAYS, TUESDAYS, WEDNESDAYS AND FRIDAYS,  take one (1) of your 4mg  strength blue warfarin tablets on these days.  ?Patient verbalized understanding of these instructions.   ?

## 2021-05-24 NOTE — Progress Notes (Signed)
Anticoagulation Management ?Caroline [name] is a 76 y.o. female who reports to the clinic for monitoring of warfarin treatment.   ? ?Indication: DVT , History of recurrence(s); long term current use of oral anticoagulant, warfarin with target INR 2.0 - 3.0.  ?Duration: indefinite ?Supervising physician: Aldine Contes ? ?Anticoagulation Clinic Visit History: ?Patient does not report signs/symptoms of bleeding or thromboembolism  ?Other recent changes: No diet, medications, lifestyle changes except as noted in patient findings.  ?Anticoagulation Episode Summary   ? ? Current INR goal:  2.0-3.0  ?TTR:  84.6 % (6.3 y)  ?Next INR check:  06/21/2021  ?INR from last check:  2.2 (05/24/2021)  ?Weekly max warfarin dose:    ?Target end date:    ?INR check location:  Anticoagulation Clinic  ?Preferred lab:    ?Send INR reminders to:  ANTICOAG [facility]  ? Indications   ?DVT ?lower extremity ?recurrent ([place]) [I82.409] ?PULMONARY EMBOLISM ?HX OF (Resolved) [Z86.718] ? ?  ?  ? ? Comments:    ?  ? ?  ? ? ?Allergies  ?Allergen Reactions  ? Penicillins Anaphylaxis, Hives, Swelling and Rash  ?  Has patient had a PCN reaction causing immediate rash, facial/tongue/throat swelling, SOB or lightheadedness with hypotension: Yes ?Has patient had a PCN reaction causing severe rash involving mucus membranes or skin necrosis: Yes ?Has patient had a PCN reaction that required hospitalization Yes ?Has patient had a PCN reaction occurring within the last 10 years: No ?If all of the above answers are "NO", then may proceed with Cephalosporin use. ?  ? Cabbage Itching  ? Keflex [Cephalexin] Hives  ?  And feeling of throat tightness  ? Shellfish Allergy Swelling  ? Tomato Swelling  ? Latex Itching and Rash  ? ? ?Current Outpatient Medications:  ?  carvedilol (COREG) 12.5 MG tablet, Take 1 tablet (12.5 mg total) by mouth 2 (two) times daily with a meal., Disp: 180 tablet, Rfl: 1 ?  cholecalciferol (VITAMIN D) 25 MCG (1000 UNIT) tablet, TAKE 1 TABLET BY  MOUTH EVERY DAY, Disp: 90 tablet, Rfl: 1 ?  fluticasone (FLONASE) 50 MCG/ACT nasal spray, PLACE 2 SPRAYS DAILY INTO BOTH NOSTRILS., Disp: 48 mL, Rfl: 1 ?  hydrocortisone (PROCTOSOL HC) 2.5 % rectal cream, Place 1 application rectally 2 (two) times daily., Disp: 30 g, Rfl: 0 ?  losartan-hydrochlorothiazide (HYZAAR) 100-25 MG tablet, Take 1 tablet by mouth daily., Disp: 90 tablet, Rfl: 1 ?  meclizine (ANTIVERT) 25 MG tablet, TAKE 1 TABLET BY MOUTH THREE TIMES A DAY AS NEEDED, Disp: 90 tablet, Rfl: 5 ?  metFORMIN (GLUCOPHAGE-XR) 500 MG 24 hr tablet, Take 1 tablet (500 mg total) by mouth 2 (two) times daily with a meal., Disp: 180 tablet, Rfl: 1 ?  montelukast (SINGULAIR) 10 MG tablet, TAKE 1 TABLET BY MOUTH EVERYDAY AT BEDTIME, Disp: 90 tablet, Rfl: 3 ?  pantoprazole (PROTONIX) 40 MG tablet, TAKE 1 TABLET BY MOUTH EVERY DAY, Disp: 90 tablet, Rfl: 1 ?  PROAIR HFA 108 (90 Base) MCG/ACT inhaler, TAKE 2 PUFFS BY MOUTH EVERY 6 HOURS AS NEEDED FOR WHEEZE OR SHORTNESS OF BREATH (Patient taking differently: Inhale 2 puffs into the lungs every 6 (six) hours as needed for wheezing or shortness of breath.), Disp: 8.5 Inhaler, Rfl: 2 ?  Psyllium (METAMUCIL PO), Take by mouth at bedtime. gummies, Disp: , Rfl:  ?  RESTASIS 0.05 % ophthalmic emulsion, INSTILL 1 DROP INTO BOTH EYES TWICE A DAY, Disp: 60 mL, Rfl: 1 ?  rosuvastatin (CRESTOR) 20 MG  tablet, TAKE 1 TABLET BY MOUTH EVERY DAY, Disp: 90 tablet, Rfl: 3 ?  SYMBICORT 160-4.5 MCG/ACT inhaler, TAKE 2 PUFFS BY MOUTH TWICE A DAY, Disp: 30.6 each, Rfl: 4 ?  warfarin (COUMADIN) 4 MG tablet, Take 1/2 tablet all days of the week, EXCEPT on Wednesdays and Saturdays, take one (1) tablet on these days., Disp: 60 tablet, Rfl: 1 ?  EPINEPHrine 0.3 mg/0.3 mL IJ SOAJ injection, Inject 0.3 mg into the muscle as needed. Severe allergic reactions (Patient not taking: Reported on 05/24/2021), Disp: , Rfl: 0 ?  tirzepatide (MOUNJARO) 2.5 MG/0.5ML Pen, Inject 2.5 mg into the skin once a week.  (Patient not taking: Reported on 05/24/2021), Disp: 2 mL, Rfl: 0 ?Past Medical History:  ?Diagnosis Date  ? Arthritis   ? Asthma   ? Bronchitis   ? CHF (congestive heart failure) ([place])   ? Diabetes ([place]) 2019  ? Diverticulitis   ? DVT, lower extremity, recurrent ([place])   ? Patient had unprovoked PE on 2002 and DVT in right lower extremety 2008.  ? Frequent urination 03/21/2019  ? GERD (gastroesophageal reflux disease)   ? Hematuria, microscopic 09/13/2017  ? History of kidney stones   ? Hypertension   ? Lower abdominal pain 02/20/2018  ? PE (pulmonary embolism)   ? Patient had unprovoked PE on 2002  ? PONV (postoperative nausea and vomiting)   ? Vertigo   ? ?Social History  ? ?Socioeconomic History  ? Marital status: Divorced  ?  Spouse name: Not on file  ? Number of children: 5  ? Years of education: 9 th grade  ? Highest education level: 9th grade  ?Occupational History  ? Occupation: retired  ?Tobacco Use  ? Smoking status: Former  ?  Types: Cigarettes  ?  Quit date: 09/18/1988  ?  Years since quitting: 32.7  ? Smokeless tobacco: Never  ? Tobacco comments:  ?  6 pack year smoking history as a teen  ?Vaping Use  ? Vaping Use: Never used  ?Substance and Sexual Activity  ? Alcohol use: No  ? Drug use: No  ? Sexual activity: Not Currently  ?  Birth control/protection: None  ?Other Topics Concern  ? Not on file  ?Social History Narrative  ? Current Social History 08/28/2019  ? Patient lives with 63 yo granddaughter in a one story home. There are not steps up to the entrance the patient uses.   ?   ? Patient's method of transportation is church member.  ?   ? The highest level of education was 9th grade  ?   ? The patient currently retired.  ?   ? Identified important Relationships are God, family   ?   ? Pets : 1 lab/pitt mix named Cinnamon  ?    ? Interests / Fun: Church,TV   ?   ? Current Stressors: "Me and my granddaughter"   ?   ? Religious / Personal Beliefs: I'm Holiness and I love people"   ?   ? Other: "I'd give away  my last dime."   ?   ? L. Ducatte, BSN, RN-BC   ? ?Social Determinants of Health  ? ?Financial Resource Strain: Not on file  ?Food Insecurity: Not on file  ?Transportation Needs: Not on file  ?Physical Activity: Not on file  ?Stress: Not on file  ?Social Connections: Not on file  ? ?Family History  ?Problem Relation Age of Onset  ? Cancer Mother   ? Hypertension Sister   ?  Diabetes Sister   ? Hypertension Brother   ? Diabetes Brother   ? Hypertension Sister   ? Diabetes Sister   ? Colon cancer Other   ? Heart Problems Other 34  ?     sister's child, open heart surgery  ? CVA Father   ? Diabetes Son   ? Hypertension Son   ? Kidney disease Son   ?     on dialysis  ? Hypertension Sister   ? Diabetes Sister   ? Hypertension Sister   ? Diabetes Sister   ? Cancer Brother   ? Hypertension Son   ? Diabetes Son   ? Hypertension Daughter   ? Schizophrenia Daughter   ? ? ?ASSESSMENT ?Recent Results: ?The most recent result is correlated with 20 mg per week: ?Lab Results  ?Component Value Date  ? INR 2.2 05/24/2021  ? INR 2.0 05/10/2021  ? INR 2.0 04/12/2021  ? ? ?Anticoagulation Dosing: ?Description   ?Take only one-half (1/2) of your 4mg  strength blue warfarin tablet, once-daily at 6PM each day--EXCEPT ON MONDAYS, TUESDAYS, [place],  take one (1) of your 4mg  strength blue warfarin tablets on these days.  ?  ?  ?INR today: Therapeutic ? ?PLAN ?Weekly dose was increased by 10% to 22 mg per week ? ?Patient Instructions  ?Patient instructed to take medications as defined in the Anti-coagulation Track section of this encounter.  ?Patient instructed to take today's dose.  ?Patient instructed to take one-half (1/2) of your 4mg  strength blue warfarin tablet, once-daily at 6PM each day--EXCEPT ON MONDAYS, TUESDAYS, [place],  take one (1) of your 4mg  strength blue warfarin tablets on these days.  ?Patient verbalized understanding of these instructions.   ?Patient advised to contact clinic or seek medical  attention if signs/symptoms of bleeding or thromboembolism occur. ? ?Patient verbalized understanding by repeating back information and was advised to contact me if further medication-related questions arise. Fraser Din

## 2021-05-25 NOTE — Progress Notes (Signed)
INTERNAL MEDICINE TEACHING ATTENDING ADDENDUM - Nicollette Wilhelmi M.D  Duration- indefinite, Indication- recurrent VTE, INR- therapeutic. Agree with pharmacy recommendations as outlined in their note.     

## 2021-06-01 ENCOUNTER — Encounter: Payer: Self-pay | Admitting: Dietician

## 2021-06-01 ENCOUNTER — Ambulatory Visit (INDEPENDENT_AMBULATORY_CARE_PROVIDER_SITE_OTHER): Payer: Medicare Other | Admitting: Dietician

## 2021-06-01 DIAGNOSIS — E118 Type 2 diabetes mellitus with unspecified complications: Secondary | ICD-10-CM

## 2021-06-01 NOTE — Progress Notes (Signed)
  Medical Nutrition Therapy :  Appt start time: 1015 end time:  1115. Total time: 60 minutes Last visit 07/2020  Assessment:  Primary concerns today: Ms. Barona is here today because she is starting [facility] per Dr. Heide Spark and for contiued weight loss/ healthy eating counseling also per Dr. Heide Spark She did not want to begin [facility] until next week so it would be on a Monday. She asked appropriate questions about side effects and healthy eating.  Her weight is essentially unchanged for the past two years. She has been meeting with dietician to try to decrease her weight for the past 4 years with result loss of 5# Preferred Learning Style: No preference indicated  Learning Readiness: Contemplating  ANTHROPOMETRICS: Estimated body mass index is 50.19 kg/m as calculated from the following:   Height as of 05/18/21: 5\' 2"  (1.575 m).   Weight as of 05/18/21: 274 lb 6.4 oz (124.5 kg).  WEIGHT HISTORY:  Wt Readings from Last 10 Encounters:  05/18/21 274 lb 6.4 oz (124.5 kg)  03/01/21 278 lb 6.4 oz (126.3 kg)  07/28/20 275 lb (124.7 kg)  07/07/20 266 lb 3.2 oz (120.7 kg)  03/24/20 285 lb (129.3 kg)  12/23/19 280 lb 1.6 oz (127.1 kg)  11/25/19 277 lb 3.2 oz (125.7 kg)  10/29/19 275 lb 4.8 oz (124.9 kg)  10/01/19 275 lb 9.6 oz (125 kg)  10/01/19 275 lb 9.6 oz (125 kg)   SLEEP:need to assess at future visit MEDICATIONS: metformin and starting Mounjaro BLOOD SUGAR:she does not check at home and did not want to have it checked today Lab Results  Component Value Date   HGBA1C 6.9 (A) 05/18/2021   HGBA1C 6.7 (A) 03/01/2021   HGBA1C 6.6 (A) 07/28/2020   HGBA1C 7.7 (A) 03/24/2020   HGBA1C 6.2 (A) 10/01/2019     DIETARY INTAKE: Usual eating pattern includes 2-3 meals and 1-2 snacks per day. Everyday foods include- see below.  Avoided foods include says she has decreased cakes, candy and cookies; Constipation:a times Dining Out (times/week): 5 24-hr recall:  B ( AM): large bowl of raisin bran or  multi grain cheerios L ( PM): [employer] 10/03/2019 or [employer] and swiss cheese sadnwich Snk ( PM): fruit D ( PM): [place] steak and potatoes or [place] dressing and gravy and lima beans or candied yams fro, [employer] Snk ( PM): klondike bar Beverages: ginger ale, water  Usual physical activity: says she is trying to walk daily  Estimated daily energy needs: 1200 calories for weight loss ~150 g carbohydrates 60-75 g protein   Progress Towards Goal(s):  No progress.   Nutritional Diagnosis:  NB-1.4 Self-monitoring deficit As related to not weighing or knowing that she had gained weight is improving  As evidenced by her report of weighing and knowing she has increased weight. NI 1.5 Excessive energy intake as related to competing values and intake of large portions of high calorie foods as evidenced by her BMI of 51.96.    Intervention:  Nutrition education about Mounjaro and healthy eating choices for weight loss. Action Goal: take Mounjaro on Monday and buy fudge pops instead of klondike bars  Outcome goal:  Coordination of care: none today  Teaching Method Utilized: Visual, Auditory,Hands on Handouts given during visit include: After visit summary Barriers to learning/adherence to lifestyle change: competing values Demonstrated degree of understanding via:  Teach Back   Monitoring/Evaluation:  Dietary intake, exercise, and body weight in 3 week(s). Friday, RD 06/01/2021 4:26 PM.

## 2021-06-01 NOTE — Patient Instructions (Addendum)
   Ideas to replace your Klondike bars  Ideas for sandwiches to add to your [place] sandwiches- tuna salad and peanut butter.  Best to either buy low salt [place] lunch meat or buy a [place] breast and cook it yourself. You can freeze some if it is too much.   Eat small meals to be sure the new medicine does not cause nausea.   Take it on the same day about the same time, it does not need to be timed with meals.   Keep unused pens in the frig in a place that won't freeze. Take the pen out that you are going to use about 30 minutes before injection.   Discard used pen in sharps container provided.   [name] [phone]    You will be due to see Dr. Heide Spark in August.

## 2021-06-04 ENCOUNTER — Other Ambulatory Visit: Payer: Self-pay | Admitting: Internal Medicine

## 2021-06-04 DIAGNOSIS — Z1231 Encounter for screening mammogram for malignant neoplasm of breast: Secondary | ICD-10-CM

## 2021-06-08 ENCOUNTER — Telehealth: Payer: Self-pay | Admitting: Dietician

## 2021-06-08 NOTE — Telephone Encounter (Signed)
Left Ms. Howser a message acknowledging that we got her message and are happy to care for her and assist her with her goals of care.

## 2021-06-08 NOTE — Telephone Encounter (Signed)
Caroline [name] left a voicemail message yesterday that she spoke with her family and they all decided that she should not start the new diabetes medicine. She intends to keep doing what she is doing, watching her diet and trying to be more active and hopes we'll continue to be her doctor and dietician.

## 2021-06-14 ENCOUNTER — Other Ambulatory Visit: Payer: Self-pay | Admitting: Internal Medicine

## 2021-06-14 DIAGNOSIS — R42 Dizziness and giddiness: Secondary | ICD-10-CM

## 2021-06-21 ENCOUNTER — Other Ambulatory Visit: Payer: Self-pay | Admitting: Internal Medicine

## 2021-06-21 ENCOUNTER — Ambulatory Visit (INDEPENDENT_AMBULATORY_CARE_PROVIDER_SITE_OTHER): Payer: Medicare Other | Admitting: Pharmacist

## 2021-06-21 DIAGNOSIS — Z7901 Long term (current) use of anticoagulants: Secondary | ICD-10-CM | POA: Diagnosis not present

## 2021-06-21 DIAGNOSIS — I82402 Acute embolism and thrombosis of unspecified deep veins of left lower extremity: Secondary | ICD-10-CM | POA: Diagnosis not present

## 2021-06-21 LAB — POCT INR: INR: 2.9 (ref 2.0–3.0)

## 2021-06-21 NOTE — Patient Instructions (Signed)
Patient instructed to take medications as defined in the Anti-coagulation Track section of this encounter.  Patient instructed to take today's dose.  Patient instructed to take only one-half (1/2) of your 4mg  strength blue warfarin tablet, once-daily at 6PM each day--EXCEPT ON MONDAYS, [place],  take one (1) of your 4mg  strength blue warfarin tablets on these days.  Patient verbalized understanding of these instructions.

## 2021-06-21 NOTE — Progress Notes (Signed)
Anticoagulation Management Caroline [name] is a 76 y.o. female who reports to the clinic for monitoring of warfarin treatment.    Indication: DVT , history of, with recurrence, LLE; long term current use of oral anticoagulant, warfarin--target INR 2.0 - 3.0.  Duration: indefinite Supervising physician: Aldine Contes  Anticoagulation Clinic Visit History: Patient does not report signs/symptoms of bleeding or thromboembolism  Other recent changes: No diet, medications, lifestyle changes except as noted in patient findings, I.e., patient is going to defer on commencing her tirzepatide/Mounjaro. 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] PULMONARY EMBOLISM HX OF (Resolved) [Z86.718]        Comments:           Allergies  Allergen Reactions   Penicillins Anaphylaxis, Hives, Swelling and Rash    Has patient had a PCN reaction causing immediate rash, facial/tongue/throat swelling, SOB or lightheadedness with hypotension: Yes Has patient had a PCN reaction causing severe rash involving mucus membranes or skin necrosis: Yes Has patient had a PCN reaction that required hospitalization Yes Has patient had a PCN reaction occurring within the last 10 years: No If all of the above answers are "NO", then may proceed with Cephalosporin use.    Cabbage Itching   Keflex [Cephalexin] Hives    And feeling of throat tightness   Shellfish Allergy Swelling   Tomato Swelling   Latex Itching and Rash    Current Outpatient Medications:    carvedilol (COREG) 12.5 MG tablet, Take 1 tablet (12.5 mg total) by mouth 2 (two) times daily with a meal., Disp: 180 tablet, Rfl: 1    cholecalciferol (VITAMIN D) 25 MCG (1000 UNIT) tablet, TAKE 1 TABLET BY MOUTH EVERY DAY, Disp: 90 tablet, Rfl: 1   fluticasone (FLONASE) 50 MCG/ACT nasal spray, PLACE 2 SPRAYS DAILY INTO BOTH NOSTRILS., Disp: 48 mL, Rfl: 1   hydrocortisone (PROCTOSOL HC) 2.5 % rectal cream, Place 1 application rectally 2 (two) times daily., Disp: 30 g, Rfl: 0   losartan-hydrochlorothiazide (HYZAAR) 100-25 MG tablet, Take 1 tablet by mouth daily., Disp: 90 tablet, Rfl: 1   meclizine (ANTIVERT) 25 MG tablet, TAKE 1 TABLET BY MOUTH THREE TIMES A DAY AS NEEDED, Disp: 90 tablet, Rfl: 5   metFORMIN (GLUCOPHAGE-XR) 500 MG 24 hr tablet, Take 1 tablet (500 mg total) by mouth 2 (two) times daily with a meal., Disp: 180 tablet, Rfl: 1   montelukast (SINGULAIR) 10 MG tablet, TAKE 1 TABLET BY MOUTH EVERYDAY AT BEDTIME, Disp: 90 tablet, Rfl: 3   pantoprazole (PROTONIX) 40 MG tablet, TAKE 1 TABLET BY MOUTH EVERY DAY, Disp: 90 tablet, Rfl: 1   PROAIR HFA 108 (90 Base) MCG/ACT inhaler, TAKE 2 PUFFS BY MOUTH EVERY 6 HOURS AS NEEDED FOR WHEEZE OR SHORTNESS OF BREATH (Patient taking differently: Inhale 2 puffs into the lungs every 6 (six) hours as needed for wheezing or shortness of breath.), Disp: 8.5 Inhaler, Rfl: 2   Psyllium (METAMUCIL PO), Take by mouth at bedtime. gummies, Disp: , Rfl:    RESTASIS 0.05 % ophthalmic emulsion, INSTILL 1 DROP INTO BOTH EYES TWICE A DAY, Disp: 60  mL, Rfl: 1   rosuvastatin (CRESTOR) 20 MG tablet, TAKE 1 TABLET BY MOUTH EVERY DAY, Disp: 90 tablet, Rfl: 3   SYMBICORT 160-4.5 MCG/ACT inhaler, TAKE 2 PUFFS BY MOUTH TWICE A DAY, Disp: 30.6 each, Rfl: 4   warfarin (COUMADIN) 4 MG tablet, Take 1/2 tablet all days of the week, EXCEPT on Wednesdays and Saturdays, take one (1) tablet on these days., Disp: 60 tablet, Rfl: 1   EPINEPHrine 0.3 mg/0.3 mL IJ SOAJ injection, Inject 0.3 mg into the muscle as needed. Severe allergic reactions (Patient not taking: Reported on 05/24/2021), Disp: , Rfl: 0   tirzepatide  (MOUNJARO) 2.5 MG/0.5ML Pen, Inject 2.5 mg into the skin once a week. (Patient not taking: Reported on 06/21/2021), Disp: 2 mL, Rfl: 0 Past Medical History:  Diagnosis Date   Arthritis    Asthma    Bronchitis    CHF (congestive heart failure) ([place])    Diabetes ([place]) 2019   Diverticulitis    DVT, lower extremity, recurrent ([place])    Patient had unprovoked PE on 2002 and DVT in right lower extremety 2008.   Frequent urination 03/21/2019   GERD (gastroesophageal reflux disease)    Hematuria, microscopic 09/13/2017   History of kidney stones    Hypertension    Lower abdominal pain 02/20/2018   PE (pulmonary embolism)    Patient had unprovoked PE on 2002   PONV (postoperative nausea and vomiting)    Vertigo    Social History   Socioeconomic History   Marital status: Divorced    Spouse name: Not on file   Number of children: 5   Years of education: 9 th grade   Highest education level: 9th grade  Occupational History   Occupation: retired  Tobacco Use   Smoking status: Former    Types: Cigarettes    Quit date: 09/18/1988    Years since quitting: 32.7   Smokeless tobacco: Never   Tobacco comments:    6 pack year smoking history as a teen  Vaping Use   Vaping Use: Never used  Substance and Sexual Activity   Alcohol use: No   Drug use: No   Sexual activity: Not Currently    Birth control/protection: None  Other Topics Concern   Not on file  Social History Narrative   Current Social History 08/28/2019   Patient lives with 67 yo granddaughter in a one story home. There are not steps up to the entrance the patient uses.       Patient's method of transportation is church member.      The highest level of education was 9th grade      The patient currently retired.      Identified important Relationships are God, family       Pets : 1 lab/pitt mix named Cinnamon       Interests / Fun: Church,TV       Current Stressors: "Me and my granddaughter"       Religious / Personal  Beliefs: I'm Holiness and I love people"       Other: "I'd give away my last dime."       L. Ducatte, BSN, RN-BC    Social Determinants of Health   Financial Resource Strain: Medium Risk (05/17/2017)   Overall Financial Resource Strain (CARDIA)    Difficulty of Paying Living Expenses: Somewhat hard  Food Insecurity: Food Insecurity Present (10/28/2019)   Hunger Vital Sign    Worried About Running Out of Food in the Last  Year: Sometimes true    Ran Out of Food in the Last Year: Never true  Transportation Needs: Unmet Transportation Needs (05/17/2017)   PRAPARE - Transportation    Lack of Transportation (Medical): No    Lack of Transportation (Non-Medical): Yes  Physical Activity: Insufficiently Active (05/17/2017)   Exercise Vital Sign    Days of Exercise per Week: 2 days    Minutes of Exercise per Session: 20 min  Stress: Stress Concern Present (05/17/2017)   [place]    Feeling of Stress : To some extent  Social Connections: Somewhat Isolated (05/17/2017)   Social Connection and Isolation Panel [NHANES]    Frequency of Communication with Friends and Family: More than three times a week    Frequency of Social Gatherings with Friends and Family: Once a week    Attends Religious Services: More than 4 times per year    Active Member of [employer] or Organizations: No    Attends Archivist Meetings: Never    Marital Status: Divorced   Family History  Problem Relation Age of Onset   Cancer Mother    Hypertension Sister    Diabetes Sister    Hypertension Brother    Diabetes Brother    Hypertension Sister    Diabetes Sister    Colon cancer Other    Heart Problems Other 78       sister's child, open heart surgery   CVA Father    Diabetes Son    Hypertension Son    Kidney disease Son        on dialysis   Hypertension Sister    Diabetes Sister    Hypertension Sister    Diabetes Sister    Cancer Brother     Hypertension Son    Diabetes Son    Hypertension Daughter    Schizophrenia Daughter     ASSESSMENT Recent Results: The most recent result is correlated with 22 mg per week: Lab Results  Component Value Date   INR 2.9 06/21/2021   INR 2.2 05/24/2021   INR 2.0 05/10/2021    Anticoagulation Dosing: Description   Take only one-half (1/2) of your 4mg  strength blue warfarin tablet, once-daily at 6PM each day--EXCEPT ON MONDAYS, [place],  take one (1) of your 4mg  strength blue warfarin tablets on these days.      INR today: Therapeutic  PLAN Weekly dose was decreased by 9% to 20 mg per week  Patient Instructions  Patient instructed to take medications as defined in the Anti-coagulation Track section of this encounter.  Patient instructed to take today's dose.  Patient instructed to take only one-half (1/2) of your 4mg  strength blue warfarin tablet, once-daily at 6PM each day--EXCEPT ON MONDAYS, [place],  take one (1) of your 4mg  strength blue warfarin tablets on these days.  Patient verbalized understanding of these instructions.   Patient advised to contact clinic or seek medical attention if signs/symptoms of bleeding or thromboembolism occur.  Patient verbalized understanding by repeating back information and was advised to contact me if further medication-related questions arise. Patient was also provided an information handout.  Follow-up Return in 4 weeks (on 07/19/2021) for Follow up INR.  Pennie Banter, PharmD, CPP  15 minutes spent face-to-face with the patient during the encounter. 50% of time spent on education, including signs/sx bleeding and clotting, as well as food and drug interactions with warfarin. 50% of time was spent on fingerprick  POC INR sample collection,processing, results determination, and documentation in http://www.kim.net/.

## 2021-06-23 NOTE — Progress Notes (Signed)
INTERNAL MEDICINE TEACHING ATTENDING ADDENDUM - Marilynn Ekstein M.D  Duration- indefinite, Indication- recurrent VTE, INR- therapeutic. Agree with pharmacy recommendations as outlined in their note.     

## 2021-07-05 ENCOUNTER — Other Ambulatory Visit: Payer: Self-pay | Admitting: Internal Medicine

## 2021-07-05 DIAGNOSIS — J4521 Mild intermittent asthma with (acute) exacerbation: Secondary | ICD-10-CM

## 2021-07-06 ENCOUNTER — Encounter: Payer: Medicare Other | Admitting: Dietician

## 2021-07-15 DIAGNOSIS — I10 Essential (primary) hypertension: Secondary | ICD-10-CM | POA: Diagnosis not present

## 2021-07-19 ENCOUNTER — Encounter: Payer: Medicare Other | Admitting: Dietician

## 2021-07-19 ENCOUNTER — Ambulatory Visit: Payer: Medicare Other

## 2021-07-23 ENCOUNTER — Other Ambulatory Visit: Payer: Self-pay

## 2021-07-23 ENCOUNTER — Observation Stay (HOSPITAL_COMMUNITY)
Admission: EM | Admit: 2021-07-23 | Discharge: 2021-07-25 | Disposition: A | Discharge status: Home Health | Payer: Medicare Other | Attending: Internal Medicine | Admitting: Internal Medicine

## 2021-07-23 ENCOUNTER — Telehealth: Payer: Self-pay | Admitting: *Deleted

## 2021-07-23 ENCOUNTER — Encounter (HOSPITAL_COMMUNITY): Payer: Self-pay

## 2021-07-23 ENCOUNTER — Emergency Department (HOSPITAL_COMMUNITY): Payer: Medicare Other

## 2021-07-23 DIAGNOSIS — Z7984 Long term (current) use of oral hypoglycemic drugs: Secondary | ICD-10-CM | POA: Diagnosis not present

## 2021-07-23 DIAGNOSIS — I110 Hypertensive heart disease with heart failure: Secondary | ICD-10-CM | POA: Insufficient documentation

## 2021-07-23 DIAGNOSIS — I959 Hypotension, unspecified: Secondary | ICD-10-CM | POA: Insufficient documentation

## 2021-07-23 DIAGNOSIS — J45909 Unspecified asthma, uncomplicated: Secondary | ICD-10-CM | POA: Insufficient documentation

## 2021-07-23 DIAGNOSIS — Z91040 Latex allergy status: Secondary | ICD-10-CM | POA: Insufficient documentation

## 2021-07-23 DIAGNOSIS — Z86711 Personal history of pulmonary embolism: Secondary | ICD-10-CM | POA: Insufficient documentation

## 2021-07-23 DIAGNOSIS — R42 Dizziness and giddiness: Secondary | ICD-10-CM | POA: Diagnosis not present

## 2021-07-23 DIAGNOSIS — D509 Iron deficiency anemia, unspecified: Secondary | ICD-10-CM | POA: Diagnosis not present

## 2021-07-23 DIAGNOSIS — Z7901 Long term (current) use of anticoagulants: Secondary | ICD-10-CM | POA: Insufficient documentation

## 2021-07-23 DIAGNOSIS — E876 Hypokalemia: Secondary | ICD-10-CM | POA: Insufficient documentation

## 2021-07-23 DIAGNOSIS — R791 Abnormal coagulation profile: Secondary | ICD-10-CM | POA: Insufficient documentation

## 2021-07-23 DIAGNOSIS — I5042 Chronic combined systolic (congestive) and diastolic (congestive) heart failure: Secondary | ICD-10-CM | POA: Diagnosis not present

## 2021-07-23 DIAGNOSIS — Z7985 Long-term (current) use of injectable non-insulin antidiabetic drugs: Secondary | ICD-10-CM | POA: Diagnosis not present

## 2021-07-23 DIAGNOSIS — I10 Essential (primary) hypertension: Secondary | ICD-10-CM

## 2021-07-23 DIAGNOSIS — N390 Urinary tract infection, site not specified: Principal | ICD-10-CM

## 2021-07-23 DIAGNOSIS — M542 Cervicalgia: Secondary | ICD-10-CM | POA: Insufficient documentation

## 2021-07-23 DIAGNOSIS — E119 Type 2 diabetes mellitus without complications: Secondary | ICD-10-CM | POA: Diagnosis not present

## 2021-07-23 DIAGNOSIS — Z86718 Personal history of other venous thrombosis and embolism: Secondary | ICD-10-CM | POA: Insufficient documentation

## 2021-07-23 DIAGNOSIS — N179 Acute kidney failure, unspecified: Secondary | ICD-10-CM | POA: Insufficient documentation

## 2021-07-23 DIAGNOSIS — R519 Headache, unspecified: Secondary | ICD-10-CM | POA: Diagnosis not present

## 2021-07-23 DIAGNOSIS — Z79899 Other long term (current) drug therapy: Secondary | ICD-10-CM | POA: Insufficient documentation

## 2021-07-23 DIAGNOSIS — R112 Nausea with vomiting, unspecified: Secondary | ICD-10-CM | POA: Insufficient documentation

## 2021-07-23 DIAGNOSIS — R531 Weakness: Secondary | ICD-10-CM

## 2021-07-23 LAB — CBC WITH DIFFERENTIAL/PLATELET
Abs Immature Granulocytes: 0.02 10*3/uL (ref 0.00–0.07)
Basophils Absolute: 0 10*3/uL (ref 0.0–0.1)
Basophils Relative: 0 %
Eosinophils Absolute: 0.2 10*3/uL (ref 0.0–0.5)
Eosinophils Relative: 2 %
HCT: 35.2 % — ABNORMAL LOW (ref 36.0–46.0)
Hemoglobin: 10.9 g/dL — ABNORMAL LOW (ref 12.0–15.0)
Immature Granulocytes: 0 %
Lymphocytes Relative: 26 %
Lymphs Abs: 2 10*3/uL (ref 0.7–4.0)
MCH: 24.3 pg — ABNORMAL LOW (ref 26.0–34.0)
MCHC: 31 g/dL (ref 30.0–36.0)
MCV: 78.6 fL — ABNORMAL LOW (ref 80.0–100.0)
Monocytes Absolute: 0.6 10*3/uL (ref 0.1–1.0)
Monocytes Relative: 8 %
Neutro Abs: 4.7 10*3/uL (ref 1.7–7.7)
Neutrophils Relative %: 64 %
Platelets: 264 10*3/uL (ref 150–400)
RBC: 4.48 MIL/uL (ref 3.87–5.11)
RDW: 14.7 % (ref 11.5–15.5)
WBC: 7.4 10*3/uL (ref 4.0–10.5)
nRBC: 0 % (ref 0.0–0.2)

## 2021-07-23 LAB — COMPREHENSIVE METABOLIC PANEL
ALT: 16 U/L (ref 0–44)
AST: 26 U/L (ref 15–41)
Albumin: 3 g/dL — ABNORMAL LOW (ref 3.5–5.0)
Alkaline Phosphatase: 54 U/L (ref 38–126)
Anion gap: 10 (ref 5–15)
BUN: 13 mg/dL (ref 8–23)
CO2: 30 mmol/L (ref 22–32)
Calcium: 9.4 mg/dL (ref 8.9–10.3)
Chloride: 99 mmol/L (ref 98–111)
Creatinine, Ser: 1.04 mg/dL — ABNORMAL HIGH (ref 0.44–1.00)
GFR, Estimated: 56 mL/min — ABNORMAL LOW (ref >60)
Glucose, Bld: 159 mg/dL — ABNORMAL HIGH (ref 70–99)
Potassium: 3 mmol/L — ABNORMAL LOW (ref 3.5–5.1)
Sodium: 139 mmol/L (ref 135–145)
Total Bilirubin: 0.6 mg/dL (ref 0.3–1.2)
Total Protein: 7.3 g/dL (ref 6.5–8.1)

## 2021-07-23 LAB — URINALYSIS, ROUTINE W REFLEX MICROSCOPIC
Bilirubin Urine: NEGATIVE
Glucose, UA: NEGATIVE mg/dL
Ketones, ur: NEGATIVE mg/dL
Nitrite: NEGATIVE
Protein, ur: 30 mg/dL — AB
Specific Gravity, Urine: 1.01 (ref 1.005–1.030)
WBC, UA: >50 WBC/hpf — ABNORMAL HIGH (ref 0–5)
pH: 5 (ref 5.0–8.0)

## 2021-07-23 LAB — TROPONIN I (HIGH SENSITIVITY)
Troponin I (High Sensitivity): 11 ng/L (ref <18)
Troponin I (High Sensitivity): 10 ng/L (ref <18)

## 2021-07-23 LAB — LIPASE, BLOOD: Lipase: 38 U/L (ref 11–51)

## 2021-07-23 LAB — GLUCOSE, CAPILLARY: Glucose-Capillary: 100 mg/dL — ABNORMAL HIGH (ref 70–99)

## 2021-07-23 LAB — PROTIME-INR
INR: 3.9 — ABNORMAL HIGH (ref 0.8–1.2)
Prothrombin Time: 37.9 seconds — ABNORMAL HIGH (ref 11.4–15.2)

## 2021-07-23 MED ORDER — FLUTICASONE FUROATE-VILANTEROL 100-25 MCG/ACT IN AEPB
1.0000 | INHALATION_SPRAY | Freq: Every day | RESPIRATORY_TRACT | Status: DC
  Administered 2021-07-24: 1 via RESPIRATORY_TRACT
  Filled 2021-07-23: qty 28

## 2021-07-23 MED ORDER — MONTELUKAST SODIUM 10 MG PO TABS
10.0000 mg | ORAL_TABLET | Freq: Every day | ORAL | Status: DC
  Administered 2021-07-23 – 2021-07-24 (×2): 10 mg via ORAL
  Filled 2021-07-23 (×2): qty 1

## 2021-07-23 MED ORDER — ROSUVASTATIN CALCIUM 20 MG PO TABS
20.0000 mg | ORAL_TABLET | Freq: Every day | ORAL | Status: DC
  Administered 2021-07-23 – 2021-07-25 (×2): 20 mg via ORAL
  Filled 2021-07-23 (×2): qty 1

## 2021-07-23 MED ORDER — SODIUM CHLORIDE 0.9 % IV SOLN: Freq: Once | INTRAVENOUS | Status: AC

## 2021-07-23 MED ORDER — POTASSIUM CHLORIDE CRYS ER 20 MEQ PO TBCR
40.0000 meq | EXTENDED_RELEASE_TABLET | Freq: Once | ORAL | Status: AC
  Administered 2021-07-23: 40 meq via ORAL
  Filled 2021-07-23: qty 2

## 2021-07-23 MED ORDER — ONDANSETRON HCL 4 MG PO TABS: 4.0000 mg | ORAL_TABLET | Freq: Four times a day (QID) | ORAL | Status: DC | PRN

## 2021-07-23 MED ORDER — SENNOSIDES-DOCUSATE SODIUM 8.6-50 MG PO TABS: 1.0000 | ORAL_TABLET | Freq: Every evening | ORAL | Status: DC | PRN

## 2021-07-23 MED ORDER — POTASSIUM CHLORIDE 10 MEQ/100ML IV SOLN
10.0000 meq | INTRAVENOUS | Status: AC
  Administered 2021-07-23 – 2021-07-24 (×3): 10 meq via INTRAVENOUS
  Filled 2021-07-23 (×3): qty 100

## 2021-07-23 MED ORDER — METFORMIN HCL ER 500 MG PO TB24
500.0000 mg | ORAL_TABLET | Freq: Two times a day (BID) | ORAL | Status: DC
  Administered 2021-07-24 – 2021-07-25 (×4): 500 mg via ORAL
  Filled 2021-07-23 (×5): qty 1

## 2021-07-23 MED ORDER — ONDANSETRON HCL 4 MG/2ML IJ SOLN: 4.0000 mg | Freq: Four times a day (QID) | INTRAMUSCULAR | Status: DC | PRN

## 2021-07-23 MED ORDER — ACETAMINOPHEN 650 MG RE SUPP: 650.0000 mg | Freq: Four times a day (QID) | RECTAL | Status: DC | PRN

## 2021-07-23 MED ORDER — ALBUTEROL SULFATE (2.5 MG/3ML) 0.083% IN NEBU: 3.0000 mL | INHALATION_SOLUTION | Freq: Four times a day (QID) | RESPIRATORY_TRACT | Status: DC | PRN

## 2021-07-23 MED ORDER — SODIUM CHLORIDE 0.9 % IV SOLN
2.0000 g | Freq: Two times a day (BID) | INTRAVENOUS | Status: DC
  Administered 2021-07-23 – 2021-07-24 (×2): 2 g via INTRAVENOUS
  Filled 2021-07-23 (×2): qty 12.5

## 2021-07-23 MED ORDER — ACETAMINOPHEN 325 MG PO TABS
650.0000 mg | ORAL_TABLET | Freq: Four times a day (QID) | ORAL | Status: DC | PRN
  Administered 2021-07-23 – 2021-07-25 (×2): 650 mg via ORAL
  Filled 2021-07-23 (×2): qty 2

## 2021-07-23 MED ORDER — ONDANSETRON 4 MG PO TBDP
4.0000 mg | ORAL_TABLET | Freq: Once | ORAL | Status: AC
  Administered 2021-07-23: 4 mg via ORAL
  Filled 2021-07-23: qty 1

## 2021-07-23 NOTE — ED Provider Triage Note (Signed)
Emergency Medicine Provider Triage Evaluation Note  Caroline [name] , a 76 y.o. female  was evaluated in triage.  Pt complains of about 10 days of posterior headache, nausea, vomiting, diarrhea, and dizziness. She reports rash on the left side of her face and neck which she thinks is an acid burn from the vomit sitting on her skin.  She has not vomited in the past 24 hours.  She is very dizzy, unable to ambulate safely to bathroom.  No falls.  Compliant with meds including warfarin.  No chest or abdominal pain.  Ongoing severe nausea.    Physical Exam  BP 121/64 (BP Location: Right Arm)   Pulse 73   Temp 98.5 F (36.9 C) (Oral)   Resp 18   Ht 5\' 2"  (1.575 m)   Wt 113.4 kg   SpO2 96%   BMI 45.73 kg/m  Gen:   Awake, no distress   Resp:  Normal effort  MSK:   Moves extremities without difficulty  Other:  Normal speech.  Coordination is grossly intact   Medical Decision Making  Medically screening exam initiated at 12:10 PM.  Appropriate orders placed.  HOUDA BRAU was informed that the remainder of the evaluation will be completed by another provider, this initial triage assessment does not replace that evaluation, and the importance of remaining in the ED until their evaluation is complete.  Note: Portions of this report may have been transcribed using voice recognition software. Every effort was made to ensure accuracy; however, inadvertent computerized transcription errors may be present    [name], PA-C 07/23/21 1214

## 2021-07-23 NOTE — Hospital Course (Signed)
Eating more and drinking water and gingerale. She was a little nauseated yesterday but none today. No vomiting. She is still having some burning with urination. No stomach pain or cramping.    F/u   [facility] clinic   Dr. Alexandria Lodge

## 2021-07-23 NOTE — ED Triage Notes (Signed)
Pt c/o vertigo, nausea, vomiting, and diarrhea x 10 days.  Pt states she feels dizzy and weak and doesn't feel like she is able to walk.  Pt has neck pain and headache. Pt states she has had vertigo in past but it usually does not last this long.  Pt states she called her PCP and was told to come and get INR checked, pt takes coumadin.

## 2021-07-23 NOTE — H&P (Cosign Needed)
Date: 07/23/2021               Patient Name:  Caroline [name] MRN: [id number]  DOB: [birth year] Age / Sex: 76 y.o., female   PCP: Earl Lagos, MD         Medical Service: Internal Medicine Teaching Service         Attending Physician: Dr. Earl Lagos, MD    First Contact: Neldon Labella, MD Pager: [phone]  Second Contact: Sharrell Ku, MD Pager: [phone]       After Hours (After 5p/  First Contact Pager: [phone]  weekends / holidays): Second Contact Pager: [phone]   SUBJECTIVE   Chief Complaint: dizziness  History of Present Illness:  8 yof with a hx of recurrent DVTs on warfarin, CHF LVEF 40-45%, DM2, HTN, who presents with complaints of dizziness.   She was in her usual state of health until approximately 10 days ago when she developed nausea, vomiting, diarrhea, and worsening weakness. She has vomited several times per day and describes it as watery since she has not been able to tolerate PO well. She has not vomited in the past several days, but has been too nauseated to eat well. Has not taken meds in past day. Has been tolerating grits and oatmeal. She has also had several episodes of diarrhea that she descries as watery and loose. Dark brown in color. She denies abdominal pain. Has felt progressively weaker. No hematemesis or hematochezia. She does endorse dizziness when she stands and has been taking her home meclizine with out relief. States that when she attempts to stand she feels as though she is about to pass out. Had an episode last night where she stood up from the commode and felt lightheaded and began to sweat. This resolved once she sat back down. She does have vertigo, but says this dizziness feels different. She does note that she has been urinating more often with associated dysuria. She has not had any fevers, no chills. No SHOB or CP. No recent sick contacts, no travel, no changes in diet.   ED Course: While in the ED, she received a dose of zofran and was  started on IVFs with subjective improvement.  UA was checked in the emergency department, indicative of infection. Started on cefepime.   Meds:  No outpatient medications have been marked as taking for the 07/23/21 encounter [facility] Encounter).    Past Medical History:  Diagnosis Date   Arthritis    Asthma    Bronchitis    CHF (congestive heart failure) (HCC)    Diabetes (HCC) 2019   Diverticulitis    DVT, lower extremity, recurrent ([facility])    Patient had unprovoked PE on 2002 and DVT in right lower extremety 2008.   Frequent urination 03/21/2019   GERD (gastroesophageal reflux disease)    Hematuria, microscopic 09/13/2017   History of kidney stones    Hypertension    Lower abdominal pain 02/20/2018   PE (pulmonary embolism)    Patient had unprovoked PE on 2002   PONV (postoperative nausea and vomiting)    Vertigo     Past Surgical History:  Procedure Laterality Date   ABDOMINAL HYSTERECTOMY     1980   CYSTOSCOPY W/ URETERAL STENT PLACEMENT Right 09/16/2015   Procedure: CYSTOSCOPY WITH RETROGRADE PYELOGRAM/URETERAL STENT PLACEMENT;  Surgeon: Alfredo Martinez, MD;  Location: [facility] OR;  Service: Urology;  Laterality: Right;   CYSTOSCOPY WITH RETROGRADE PYELOGRAM, URETEROSCOPY AND STENT PLACEMENT Right 12/11/2015   Procedure: RIGHT URETEROSCOPY/HOLMIUM  LASER LITHOTRIPSY AND STONE REMOVAL removal and placement of double j stent;  Surgeon: Crist Fat, MD;  Location: [facility] ORS;  Service: Urology;  Laterality: Right;   HOLMIUM LASER APPLICATION Right 12/11/2015   Procedure: HOLMIUM LASER APPLICATION;  Surgeon: Crist Fat, MD;  Location: [facility] ORS;  Service: Urology;  Laterality: Right;    Social:  Lives With: granddaughter Occupation: none Support: granddaughter  Level of Function: independent ADLS and IADLs PCP: Earl Lagos, MD Substances: none  Family History: HTN, DM, cancer  Allergies: Allergies as of 07/23/2021 - Review Complete 07/23/2021  Allergen Reaction  Noted   Penicillins Anaphylaxis, Hives, Swelling, and Rash 06/10/2006   Cabbage Itching 11/24/2015   Keflex [cephalexin] Hives 09/26/2016   Other  07/23/2021   Shellfish allergy Swelling 09/13/2011   Tomato Swelling 09/13/2011   Latex Itching and Rash 11/24/2015    Review of Systems: A complete ROS was negative except as per HPI.   OBJECTIVE:   Physical Exam: Blood pressure 127/66, pulse 79, temperature 98 F (36.7 C), temperature source Oral, resp. rate 19, height 5\' 2"  (1.575 m), weight 113.4 kg, SpO2 100 %.  Constitutional: well-appearing female sitting in bed, in no acute distress HENT: normocephalic atraumatic, mucous membranes moist Eyes: conjunctiva non-erythematous Neck: supple Cardiovascular: regular rate and rhythm, no m/r/g Pulmonary/Chest: normal work of breathing on room air, lungs clear to auscultation bilaterally Abdominal: soft, non-tender, non-distended MSK: normal bulk and tone Neurological: alert & oriented x 3, 5/5 strength in bilateral upper and lower extremities Skin: warm and dry Psych: mood and affect appropriate  Labs: CBC    Component Value Date/Time   WBC 7.4 07/23/2021 1217   RBC 4.48 07/23/2021 1217   HGB 10.9 (L) 07/23/2021 1217   HGB 10.3 (L) 08/15/2018 1048   HCT 35.2 (L) 07/23/2021 1217   HCT 33.6 (L) 08/15/2018 1048   PLT 264 07/23/2021 1217   PLT 187 08/15/2018 1048   MCV 78.6 (L) 07/23/2021 1217   MCV 79 08/15/2018 1048   MCH 24.3 (L) 07/23/2021 1217   MCHC 31.0 07/23/2021 1217   RDW 14.7 07/23/2021 1217   RDW 14.6 08/15/2018 1048   LYMPHSABS 2.0 07/23/2021 1217   MONOABS 0.6 07/23/2021 1217   EOSABS 0.2 07/23/2021 1217   BASOSABS 0.0 07/23/2021 1217     CMP     Component Value Date/Time   NA 139 07/23/2021 1217   NA 143 08/17/2020 1106   K 3.0 (L) 07/23/2021 1217   CL 99 07/23/2021 1217   CO2 30 07/23/2021 1217   GLUCOSE 159 (H) 07/23/2021 1217   BUN 13 07/23/2021 1217   BUN 19 08/17/2020 1106   CREATININE 1.04 (H)  07/23/2021 1217   CREATININE 1.22 (H) 11/13/2015 1024   CALCIUM 9.4 07/23/2021 1217   PROT 7.3 07/23/2021 1217   PROT 6.9 08/29/2017 1346   ALBUMIN 3.0 (L) 07/23/2021 1217   ALBUMIN 3.7 08/29/2017 1346   AST 26 07/23/2021 1217   ALT 16 07/23/2021 1217   ALKPHOS 54 07/23/2021 1217   BILITOT 0.6 07/23/2021 1217   BILITOT 0.4 08/29/2017 1346   GFRNONAA 56 (L) 07/23/2021 1217   GFRNONAA 85 04/08/2014 1353   GFRAA 78 03/02/2020 1038   GFRAA >89 04/08/2014 1353    Imaging: CT HEAD WO CONTRAST (04/10/2014)  Result Date: 07/23/2021 CLINICAL DATA:  Posterior headache for the past 10 days.  Dizziness. EXAM: CT HEAD WITHOUT CONTRAST TECHNIQUE: Contiguous axial images were obtained from the base of the skull through the  vertex without intravenous contrast. RADIATION DOSE REDUCTION: This exam was performed according to the departmental dose-optimization program which includes automated exposure control, adjustment of the mA and/or kV according to patient size and/or use of iterative reconstruction technique. COMPARISON:  01/03/2011 FINDINGS: Brain: Minimally enlarged ventricles and subarachnoid spaces. No intracranial hemorrhage, mass lesion or CT evidence of acute infarction. Vascular: No hyperdense vessel or unexpected calcification. Skull: Mild bilateral hyperostosis frontalis.  No fractures. Sinuses/Orbits: Unremarkable. Other: None. IMPRESSION: No acute abnormality. Electronically Signed   By: Beckie Salts M.D.   On: 07/23/2021 13:17    EKG: personally reviewed my interpretation is normal sinus, T wave flattening left fascicular block, RBBB, similar to prior   ASSESSMENT & PLAN:    Assessment & Plan by Problem: Principal Problem:   Nausea, vomiting, and diarrhea   AIMA MCWHIRT is a 76 y.o. 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 - N/V/D possible related to UTI vs viral gastroenteritis. She is hemodynamically stable and no longer experiencing any systemic signs or symptoms.  - cefepime 2g Q12 hr. Patient can likely be transitioned to PO once she is able to reliable take PO. - Zofran for nausea - encourage PO as tolerated, IVFs as necessary - holding BP meds in the setting of hypotension - orthostatics. Holding meclizine - PT/OT - morning CBC and BMP  Recurrent DVT on coumadin - INR today 3.9 in supratherapeutic range despite missing dose of warfarin yesterday. Likely related to recent illness and decreased PO intake.  - no evidence of bleeding - We will hold her warfarin for now, treat her UTI, and encourage PO intake  - Recheck INR in AM for improvement.   - monitor for evidence of bleed with CBC  Hypokalemia - some T wave flattening noted on EKG, otherwise appears similar to prior.  - replete as necessary.  - monitor with daily BMP  Microcytic anemia - appears stable with no evidence of over bleeding. Past iron panel suggest of chronic disease, however she had a recent +FOBT and has not followed up with recommended colonoscopy.  - iron panel ordered - can consider iron supplementation once UTI controlled.  Chronic combined CHF - LVEF 40-45% - No evidence of volume overload at this time.  - holding BP meds in the setting of hypotension  DM2 - most recent a1c 6.9 - metformin  Asthma - restarted home albuterol, breo ellipta, singulair    Diet: Carb-Modified VTE: None IVF: None,None Code: Full  Prior to Admission Living Arrangement: Home, living granddaughter Anticipated Discharge Location: Home Barriers to Discharge: medical stability  Dispo: Admit patient to Observation with expected length of stay less than 2 midnights.  Signed: Adron Bene, MD Internal Medicine Resident PGY-1 Pager: [phone]  07/23/2021, 10:06 PM

## 2021-07-23 NOTE — ED Provider Notes (Signed)
[facility] EMERGENCY DEPARTMENT Provider Note   CSN: [id number] Arrival date & time: 07/23/21  1127     History  Chief Complaint  Patient presents with   Dizziness    Caroline [name] is a 76 y.o. female.  76 year old female with prior medical history as detailed below presents for evaluation.  Patient complains of approximately 2 weeks of intermittent dizziness and associated mild nausea.  She does report intermittent vomiting.  Her last emesis was reportedly yesterday.  She comes today just to get "checked out."  She reports that she takes meclizine nearly every day for treatment of what she describes as vertigo.  She denies fever.  She denies chest or abdominal pain.  She reports increasing weakness and difficulty getting out of bed.  She reports persistent intermittent nausea and vomiting and associated decreased p.o. intake.  She is on Coumadin.  She is requesting that her INR be checked today.  The history is provided by the patient and medical records.  Dizziness Quality:  Lightheadedness Severity:  Mild Onset quality:  Gradual Duration:  2 weeks Timing:  Intermittent Progression:  Waxing and waning Chronicity:  Chronic      Home Medications Prior to Admission medications   Medication Sig Start Date End Date Taking? Authorizing Provider  carvedilol (COREG) 12.5 MG tablet Take 1 tablet (12.5 mg total) by mouth 2 (two) times daily with a meal. 03/01/21   Atway, Rayann N, DO  cholecalciferol (VITAMIN D) 25 MCG (1000 UNIT) tablet TAKE 1 TABLET BY MOUTH EVERY DAY 06/21/21   Earl Lagos, MD  EPINEPHrine 0.3 mg/0.3 mL IJ SOAJ injection Inject 0.3 mg into the muscle as needed. Severe allergic reactions Patient not taking: Reported on 05/24/2021 10/30/17   [provider]  fluticasone (FLONASE) 50 MCG/ACT nasal spray PLACE 2 SPRAYS DAILY INTO BOTH NOSTRILS. 03/29/21   Earl Lagos, MD  hydrocortisone (PROCTOSOL HC) 2.5 % rectal cream  Place 1 application rectally 2 (two) times daily. 03/01/21   Atway, Rayann N, DO  losartan-hydrochlorothiazide (HYZAAR) 100-25 MG tablet Take 1 tablet by mouth daily. 03/01/21   Atway, Rayann N, DO  meclizine (ANTIVERT) 25 MG tablet TAKE 1 TABLET BY MOUTH THREE TIMES A DAY AS NEEDED 06/15/21   Earl Lagos, MD  metFORMIN (GLUCOPHAGE-XR) 500 MG 24 hr tablet Take 1 tablet (500 mg total) by mouth 2 (two) times daily with a meal. 03/01/21   Atway, Rayann N, DO  montelukast (SINGULAIR) 10 MG tablet TAKE 1 TABLET BY MOUTH EVERYDAY AT BEDTIME 07/06/21   Earl Lagos, MD  pantoprazole (PROTONIX) 40 MG tablet TAKE 1 TABLET BY MOUTH EVERY DAY 05/11/21   Earl Lagos, MD  PROAIR HFA 108 (90 Base) MCG/ACT inhaler TAKE 2 PUFFS BY MOUTH EVERY 6 HOURS AS NEEDED FOR WHEEZE OR SHORTNESS OF BREATH Patient taking differently: Inhale 2 puffs into the lungs every 6 (six) hours as needed for wheezing or shortness of breath. 01/30/18   Earl Lagos, MD  Psyllium (METAMUCIL PO) Take by mouth at bedtime. gummies    [provider]  RESTASIS 0.05 % ophthalmic emulsion INSTILL 1 DROP INTO BOTH EYES TWICE A DAY 08/14/20   Carlynn Purl C, DO  rosuvastatin (CRESTOR) 20 MG tablet TAKE 1 TABLET BY MOUTH EVERY DAY 02/03/21   Earl Lagos, MD  SYMBICORT 160-4.5 MCG/ACT inhaler TAKE 2 PUFFS BY MOUTH TWICE A DAY 10/19/20   Earl Lagos, MD  tirzepatide [facility]) 2.5 MG/0.5ML Pen Inject 2.5 mg into the skin once a week.  Patient not taking: Reported on 06/21/2021 05/18/21   Earl Lagos, MD  warfarin (COUMADIN) 4 MG tablet Take 1/2 tablet all days of the week, EXCEPT on Wednesdays and Saturdays, take one (1) tablet on these days. 03/01/21   Atway, Derwood Kaplan, DO      Allergies    Penicillins, Cabbage, Keflex [cephalexin], Other, Shellfish allergy, Tomato, and Latex    Review of Systems   Review of Systems  Neurological:  Positive for dizziness.  All other systems reviewed and are negative.   Physical  Exam Updated Vital Signs BP (!) 126/44 (BP Location: Right Arm)   Pulse 76   Temp 98 F (36.7 C) (Oral)   Resp 17   Ht 5\' 2"  (1.575 m)   Wt 113.4 kg   SpO2 99%   BMI 45.73 kg/m  Physical Exam Vitals and nursing note reviewed.  Constitutional:      General: She is not in acute distress.    Appearance: Normal appearance. She is well-developed.  HENT:     Head: Normocephalic and atraumatic.  Eyes:     Conjunctiva/sclera: Conjunctivae normal.     Pupils: Pupils are equal, round, and reactive to light.  Cardiovascular:     Rate and Rhythm: Normal rate and regular rhythm.     Heart sounds: Normal heart sounds.  Pulmonary:     Effort: Pulmonary effort is normal. No respiratory distress.     Breath sounds: Normal breath sounds.  Abdominal:     General: There is no distension.     Palpations: Abdomen is soft.     Tenderness: There is no abdominal tenderness.  Musculoskeletal:        General: No deformity. Normal range of motion.     Cervical back: Normal range of motion and neck supple.  Skin:    General: Skin is warm and dry.  Neurological:     General: No focal deficit present.     Mental Status: She is alert and oriented to person, place, and time. Mental status is at baseline.     Cranial Nerves: No cranial nerve deficit.     Sensory: No sensory deficit.     Motor: No weakness.     Coordination: Coordination normal.     ED Results / Procedures / Treatments   Labs (all labs ordered are listed, but only abnormal results are displayed) Labs Reviewed  COMPREHENSIVE METABOLIC PANEL - Abnormal; Notable for the following components:      Result Value   Potassium 3.0 (*)    Glucose, Bld 159 (*)    Creatinine, Ser 1.04 (*)    Albumin 3.0 (*)    GFR, Estimated 56 (*)    All other components within normal limits  CBC WITH DIFFERENTIAL/PLATELET - Abnormal; Notable for the following components:   Hemoglobin 10.9 (*)    HCT 35.2 (*)    MCV 78.6 (*)    MCH 24.3 (*)    All  other components within normal limits  PROTIME-INR - Abnormal; Notable for the following components:   Prothrombin Time 37.9 (*)    INR 3.9 (*)    All other components within normal limits  LIPASE, BLOOD  URINALYSIS, ROUTINE W REFLEX MICROSCOPIC  TROPONIN I (HIGH SENSITIVITY)  TROPONIN I (HIGH SENSITIVITY)    EKG EKG Interpretation  Date/Time:  Friday July 23 2021 11:32:50 EDT Ventricular Rate:  73 PR Interval:  172 QRS Duration: 156 QT Interval:  436 QTC Calculation: 480 R Axis:   -73 Text  Interpretation: Normal sinus rhythm Right bundle branch block Left anterior fascicular block  Bifascicular blockMinimal voltage criteria for LVH, may be normal variant ( R in aVL ) Abnormal ECG When compared with ECG of 07-Apr-2017 08:47, PREVIOUS ECG IS PRESENT Confirmed by Kristine Royal [phone]) on 07/23/2021 4:55:38 PM Radiology CT HEAD WO CONTRAST ( )  Result Date: 07/23/2021 CLINICAL DATA:  Posterior headache for the past 10 days.  Dizziness. EXAM: CT HEAD WITHOUT CONTRAST TECHNIQUE: Contiguous axial images were obtained from the base of the skull through the vertex without intravenous contrast. RADIATION DOSE REDUCTION: This exam was performed according to the departmental dose-optimization program which includes automated exposure control, adjustment of the mA and/or kV according to patient size and/or use of iterative reconstruction technique. COMPARISON:  01/03/2011 FINDINGS: Brain: Minimally enlarged ventricles and subarachnoid spaces. No intracranial hemorrhage, mass lesion or CT evidence of acute infarction. Vascular: No hyperdense vessel or unexpected calcification. Skull: Mild bilateral hyperostosis frontalis.  No fractures. Sinuses/Orbits: Unremarkable. Other: None. IMPRESSION: No acute abnormality. Electronically Signed   By: Beckie Salts M.D.   On: 07/23/2021 13:17    Procedures Procedures    Medications Ordered in ED Medications  ondansetron (ZOFRAN-ODT) disintegrating tablet 4  mg (4 mg Oral Given 07/23/21 1718)    ED Course/ Medical Decision Making/ A&P                           Medical Decision Making Amount and/or Complexity of Data Reviewed Labs: ordered.  Risk Prescription drug management. Decision regarding hospitalization.    Medical Screen Complete  This patient presented to the ED with complaint of vertigo, nausea.  This complaint involves an extensive number of treatment options. The initial differential diagnosis includes, but is not limited to, metabolic abnormality, infection, anemia, etc.  This presentation is: Chronic, Self-Limited, Previously Undiagnosed, Uncertain Prognosis, Complicated, Systemic Symptoms, and Threat to Life/Bodily Function  Patient is reporting longstanding history of vertigo.  She is reporting longstanding issues with same.  Patient's complaints are mostly chronic in nature.  Work-up reveals evidence of mild hypokalemia.  Patient does take potassium at home for treatment of same. INR today is 3.9.  Work-up demonstrates additionally evidence of likely UTI.  Patient would likely benefit from admission.  Teaching service made aware of case and will evaluate for admission.  Additional history obtained:  External records from outside sources obtained and reviewed including prior ED visits and prior Inpatient records.    Lab Tests:  I ordered and personally interpreted labs.  The pertinent results include: CBC, troponin, CMP, INR, lipase, urine   Imaging Studies ordered:  I ordered imaging studies including CT head I independently visualized and interpreted obtained imaging which showed NAD I agree with the radiologist interpretation.   Cardiac Monitoring:  The patient was maintained on a cardiac monitor.  I personally viewed and interpreted the cardiac monitor which showed an underlying rhythm of: NSR   Medicines ordered:  I ordered medication including IV fluids, Zofran, cefepime, potassium for  hypokalemia, UTI, nausea Reevaluation of the patient after these medicines showed that the patient: improved  Problem List / ED Course:  Weakness, nausea, vomiting, hypokalemia, UTI   Reevaluation:  After the interventions noted above, I reevaluated the patient and found that they have: improved    Disposition:  After consideration of the diagnostic results and the patients response to treatment, I feel that the patent would benefit from admission.  Final Clinical Impression(s) / ED Diagnoses Final diagnoses:  Weakness  Urinary tract infection without hematuria, site unspecified  Hypokalemia    Rx / DC Orders ED Discharge Orders     None         Wynetta Fines, MD 07/23/21 2046

## 2021-07-23 NOTE — Telephone Encounter (Signed)
Agree thank you 

## 2021-07-23 NOTE — Telephone Encounter (Signed)
Call from patient states is having Vertigo with Nausea and vomiting   Requesting a HH to come in to evaluate and to do an INR.  Patient was advised that she should go to the Urgent Care or ER for assessment of the Vertigo and the Nausea and vomiting.  Has not used a HH service in 3 years.  Patient to go to the Urgent Care near her home for evaluation of her Vertigo.

## 2021-07-24 ENCOUNTER — Observation Stay (HOSPITAL_COMMUNITY): Payer: Medicare Other

## 2021-07-24 DIAGNOSIS — N390 Urinary tract infection, site not specified: Secondary | ICD-10-CM | POA: Diagnosis not present

## 2021-07-24 DIAGNOSIS — N179 Acute kidney failure, unspecified: Secondary | ICD-10-CM | POA: Diagnosis not present

## 2021-07-24 DIAGNOSIS — N3000 Acute cystitis without hematuria: Secondary | ICD-10-CM | POA: Diagnosis not present

## 2021-07-24 LAB — COMPREHENSIVE METABOLIC PANEL
ALT: 14 U/L (ref 0–44)
AST: 16 U/L (ref 15–41)
Albumin: 2.9 g/dL — ABNORMAL LOW (ref 3.5–5.0)
Alkaline Phosphatase: 47 U/L (ref 38–126)
Anion gap: 10 (ref 5–15)
BUN: 17 mg/dL (ref 8–23)
CO2: 28 mmol/L (ref 22–32)
Calcium: 8.9 mg/dL (ref 8.9–10.3)
Chloride: 100 mmol/L (ref 98–111)
Creatinine, Ser: 1.47 mg/dL — ABNORMAL HIGH (ref 0.44–1.00)
GFR, Estimated: 37 mL/min — ABNORMAL LOW (ref >60)
Glucose, Bld: 150 mg/dL — ABNORMAL HIGH (ref 70–99)
Potassium: 3.3 mmol/L — ABNORMAL LOW (ref 3.5–5.1)
Sodium: 138 mmol/L (ref 135–145)
Total Bilirubin: 0.6 mg/dL (ref 0.3–1.2)
Total Protein: 6.8 g/dL (ref 6.5–8.1)

## 2021-07-24 LAB — MAGNESIUM: Magnesium: 1.5 mg/dL — ABNORMAL LOW (ref 1.7–2.4)

## 2021-07-24 LAB — CBC
HCT: 32 % — ABNORMAL LOW (ref 36.0–46.0)
Hemoglobin: 10.1 g/dL — ABNORMAL LOW (ref 12.0–15.0)
MCH: 24.6 pg — ABNORMAL LOW (ref 26.0–34.0)
MCHC: 31.6 g/dL (ref 30.0–36.0)
MCV: 78 fL — ABNORMAL LOW (ref 80.0–100.0)
Platelets: 228 10*3/uL (ref 150–400)
RBC: 4.1 MIL/uL (ref 3.87–5.11)
RDW: 14.6 % (ref 11.5–15.5)
WBC: 7.8 10*3/uL (ref 4.0–10.5)
nRBC: 0 % (ref 0.0–0.2)

## 2021-07-24 LAB — GLUCOSE, CAPILLARY
Glucose-Capillary: 128 mg/dL — ABNORMAL HIGH (ref 70–99)
Glucose-Capillary: 124 mg/dL — ABNORMAL HIGH (ref 70–99)

## 2021-07-24 LAB — POTASSIUM: Potassium: 3.6 mmol/L (ref 3.5–5.1)

## 2021-07-24 LAB — FERRITIN: Ferritin: 288 ng/mL (ref 11–307)

## 2021-07-24 LAB — PROTIME-INR
INR: 4 — ABNORMAL HIGH (ref 0.8–1.2)
Prothrombin Time: 38.6 seconds — ABNORMAL HIGH (ref 11.4–15.2)

## 2021-07-24 LAB — IRON AND TIBC
Iron: 25 ug/dL — ABNORMAL LOW (ref 28–170)
Saturation Ratios: 10 % — ABNORMAL LOW (ref 10.4–31.8)
TIBC: 259 ug/dL (ref 250–450)
UIBC: 234 ug/dL

## 2021-07-24 LAB — TSH: TSH: 2.297 u[IU]/mL (ref 0.350–4.500)

## 2021-07-24 MED ORDER — LACTATED RINGERS IV BOLUS
500.0000 mL | Freq: Once | INTRAVENOUS | Status: AC
  Administered 2021-07-24: 500 mL via INTRAVENOUS

## 2021-07-24 MED ORDER — SULFAMETHOXAZOLE-TRIMETHOPRIM 800-160 MG PO TABS
1.0000 | ORAL_TABLET | Freq: Two times a day (BID) | ORAL | Status: DC
  Administered 2021-07-24 – 2021-07-25 (×3): 1 via ORAL
  Filled 2021-07-24 (×3): qty 1

## 2021-07-24 MED ORDER — POTASSIUM CHLORIDE CRYS ER 20 MEQ PO TBCR: 40.0000 meq | EXTENDED_RELEASE_TABLET | Freq: Two times a day (BID) | ORAL | Status: DC

## 2021-07-24 MED ORDER — MAGNESIUM SULFATE 2 GM/50ML IV SOLN
2.0000 g | Freq: Once | INTRAVENOUS | Status: AC
  Administered 2021-07-24: 2 g via INTRAVENOUS
  Filled 2021-07-24: qty 50

## 2021-07-24 MED ORDER — WARFARIN - PHARMACIST DOSING INPATIENT: Freq: Every day | Status: DC

## 2021-07-24 MED ORDER — POTASSIUM CHLORIDE CRYS ER 20 MEQ PO TBCR
40.0000 meq | EXTENDED_RELEASE_TABLET | Freq: Two times a day (BID) | ORAL | Status: AC
  Administered 2021-07-24 (×2): 40 meq via ORAL
  Filled 2021-07-24 (×2): qty 2

## 2021-07-24 NOTE — Evaluation (Signed)
Physical Therapy Evaluation Patient Details Name: Caroline [name] MRN: [id number] DOB: 22-Jul-1945 Today's Date: 07/24/2021  History of Present Illness  49 y presents to ED 07/23/21 with c/o dizziness. 10 days prior developed nausea, vomiting, diarrhea and worsening weakness. 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 Pt reports 2 weeks ago she was walking limited community distances with cane and independent in ADLs and iADLs. Pt has had increasing dizziness and spent the last 10 days in a recliner, unable to keep her Meclizine down due to vomiting, so her BBPV would make her so dizzy she could not get up. Pt able to keep Meclizine down this morning and did not experience dizziness with movement today, however she is limited in safe mobility by generalized weakness and associated decreased balance. Pt is min A for bed mobility and transfers. At this point PT recommending SNF level rehab prior to return home to regain PLOF, however may be able to progress HHPT with 24 hour assist. PT will continue to follow acutely.        Recommendations for follow up therapy are one component of a multi-disciplinary discharge planning process, led by the attending physician.  Recommendations may be updated based on patient status, additional functional criteria and insurance authorization.  Follow Up Recommendations Skilled nursing-short term rehab (<3 hours/day) Can patient physically be transported by private vehicle: Yes    Assistance Recommended at Discharge Frequent or constant Supervision/Assistance  Patient can return home with the following  A little help with walking and/or transfers;A little help with bathing/dressing/bathroom;Assistance with cooking/housework;Direct supervision/assist for medications management;Assist for  transportation;Help with stairs or ramp for entrance    Equipment Recommendations None recommended by PT  Recommendations for Other Services  OT consult    Functional Status Assessment Patient has had a recent decline in their functional status and demonstrates the ability to make significant improvements in function in a reasonable and predictable amount of time.     Precautions / Restrictions Precautions Precautions: Fall Restrictions Weight Bearing Restrictions: No      Mobility  Bed Mobility Overal bed mobility: Needs Assistance Bed Mobility: Supine to Sit     Supine to sit: Min assist, HOB elevated     General bed mobility comments: min A for managing LE across bed and bringing trunk to upright    Transfers Overall transfer level: Needs assistance Equipment used: Rolling walker (2 wheels) Transfers: Sit to/from Stand, Bed to chair/wheelchair/BSC Sit to Stand: Min assist   Step pivot transfers: Min assist       General transfer comment: good power up, min A for steadying at RW and for steadying with stepping to [facility]    Ambulation/Gait               General Gait Details: left with NT sitting on BSC      Balance Overall balance assessment: Needs assistance Sitting-balance support: Feet supported, No upper extremity supported Sitting balance-Leahy Scale: Fair     Standing balance support: Bilateral upper extremity supported Standing balance-Leahy Scale: Poor Standing balance comment: requires UE support for dynamic balance                             Pertinent Vitals/Pain Pain Assessment Pain Assessment: Faces Faces Pain Scale:  Hurts little more Pain Location: generalized with movement Pain Descriptors / Indicators: Grimacing, Guarding Pain Intervention(s): Limited activity within patient's tolerance, Monitored during session, Repositioned    Home Living Family/patient expects to be discharged to:: Private residence Living  Arrangements: Other relatives (granddaughter) Available Help at Discharge: Family;Available PRN/intermittently Type of Home: House Home Access: Stairs to enter   Entrance Stairs-Number of Steps: 1   Home Layout: One level Home Equipment: Tub bench;Hand held shower head;BSC/3in1;Cane - single point;Wheelchair - manual (three wheel walker) Additional Comments: uses wheelchair to get to car and for    Prior Function Prior Level of Function : Independent/Modified Independent             Mobility Comments: using cane for limited community ambulation 2weeks ago, requiring RW for household ambulation and wheelchair for limited community ambulation ADLs Comments: independent with ADLs and iADLs until recent illness requiring set up and BSC     Hand Dominance        Extremity/Trunk Assessment   Upper Extremity Assessment Upper Extremity Assessment: Defer to OT evaluation    Lower Extremity Assessment Lower Extremity Assessment: RLE deficits/detail;LLE deficits/detail RLE Deficits / Details: ROM limited by body habitus, strength grossly 3+/5 RLE Coordination: decreased fine motor LLE Deficits / Details: ROM limited by body habitus, strength grossly 3+/5 LLE Coordination: decreased fine motor       Communication   Communication: No difficulties  Cognition Arousal/Alertness: Awake/alert Behavior During Therapy: WFL for tasks assessed/performed Overall Cognitive Status: Within Functional Limits for tasks assessed                                 General Comments: requires repetition for comprehension        General Comments General comments (skin integrity, edema, etc.): VSS on RA, no dizziness with movement        Assessment/Plan    PT Assessment Patient needs continued PT services  PT Problem List Decreased strength;Decreased activity tolerance;Decreased balance;Decreased range of motion;Decreased mobility;Decreased coordination       PT Treatment  Interventions DME instruction;Gait training;Stair training;Functional mobility training;Therapeutic activities;Therapeutic exercise;Balance training;Cognitive remediation;Patient/family education    PT Goals (Current goals can be found in the Care Plan section)  Acute Rehab PT Goals Patient Stated Goal: get her independence back PT Goal Formulation: With patient Time For Goal Achievement: 08/07/21 Potential to Achieve Goals: Good    Frequency Min 2X/week        AM-PAC PT "6 Clicks" Mobility  Outcome Measure Help needed turning from your back to your side while in a flat bed without using bedrails?: None Help needed moving from lying on your back to sitting on the side of a flat bed without using bedrails?: A Little Help needed moving to and from a bed to a chair (including a wheelchair)?: A Little Help needed standing up from a chair using your arms (e.g., wheelchair or bedside chair)?: A Little Help needed to walk in hospital room?: A Lot Help needed climbing 3-5 steps with a railing? : Total 6 Click Score: 16    End of Session   Activity Tolerance: Patient tolerated treatment well Patient left: Other (comment);with nursing/sitter in room (on [facility], NT present) Nurse Communication: Mobility status PT Visit Diagnosis: Muscle weakness (generalized) (M62.81);Unsteadiness on feet (R26.81)    Time: 4287-6811 PT Time Calculation (min) (ACUTE ONLY): 30 min   Charges:   PT Evaluation $PT Eval Moderate Complexity: 1 Mod PT  Treatments $Therapeutic Activity: 8-22 mins        Narmeen Kerper B. Beverely Risen PT, DPT Acute Rehabilitation Services Please use secure chat or  Call Office [phone]   [facility] 07/24/2021, 12:01 PM

## 2021-07-24 NOTE — Progress Notes (Signed)
ANTICOAGULATION CONSULT NOTE - Initial Consult  Pharmacy Consult for Warfarin Indication: Recurrent DVT  Allergies  Allergen Reactions   Penicillins Anaphylaxis, Hives, Swelling and Rash    Has patient had a PCN reaction causing immediate rash, facial/tongue/throat swelling, SOB or lightheadedness with hypotension: Yes Has patient had a PCN reaction causing severe rash involving mucus membranes or skin necrosis: Yes Has patient had a PCN reaction that required hospitalization Yes Has patient had a PCN reaction occurring within the last 10 years: No If all of the above answers are "NO", then may proceed with Cephalosporin use.    Cabbage Itching   Keflex [Cephalexin] Hives    And feeling of throat tightness   Other Itching    Lettuce - itching   Shellfish Allergy Swelling   Tomato Swelling   Latex Itching and Rash    Patient Measurements: Height: 5\' 2"  (157.5 cm) Weight: 113.4 kg (250 lb) IBW/kg (Calculated) : 50.1  Vital Signs: Temp: 98.2 F (36.8 C) (07/15 0950) Temp Source: Oral (07/15 0950) BP: 105/45 (07/15 0950) Pulse Rate: 67 (07/15 0950)  Labs: Recent Labs    07/23/21 1217 07/23/21 1403 07/24/21 0227  HGB 10.9*  --  10.1*  HCT 35.2*  --  32.0*  PLT 264  --  228  LABPROT 37.9*  --  38.6*  INR 3.9*  --  4.0*  CREATININE 1.04*  --  1.47*  TROPONINIHS 11 10  --     Estimated Creatinine Clearance: 40 mL/min (A) (by C-G formula based on SCr of 1.47 mg/dL (H)).   Medical History: Past Medical History:  Diagnosis Date   Arthritis    Asthma    Bronchitis    CHF (congestive heart failure) (HCC)    Diabetes (HCC) 2019   Diverticulitis    DVT, lower extremity, recurrent (HCC)    Patient had unprovoked PE on 2002 and DVT in right lower extremety 2008.   Frequent urination 03/21/2019   GERD (gastroesophageal reflux disease)    Hematuria, microscopic 09/13/2017   History of kidney stones    Hypertension    Lower abdominal pain 02/20/2018   PE (pulmonary  embolism)    Patient had unprovoked PE on 2002   PONV (postoperative nausea and vomiting)    Vertigo     Medications:  Medications Prior to Admission  Medication Sig Dispense Refill Last Dose   acetaminophen (TYLENOL) 500 MG tablet Take 500 mg by mouth 4 (four) times daily as needed for mild pain or headache.   Past Week   cholecalciferol (VITAMIN D) 25 MCG (1000 UNIT) tablet TAKE 1 TABLET BY MOUTH EVERY DAY (Patient taking differently: Take 1,000 Units by mouth daily.) 90 tablet 1 07/23/2021   fluticasone (FLONASE) 50 MCG/ACT nasal spray PLACE 2 SPRAYS DAILY INTO BOTH NOSTRILS. (Patient taking differently: Place 2 sprays into both nostrils daily as needed for allergies.) 48 mL 1 Past Week   hydrocortisone (PROCTOSOL HC) 2.5 % rectal cream Place 1 application rectally 2 (two) times daily. (Patient taking differently: Place 1 application  rectally 2 (two) times daily as needed for hemorrhoids.) 30 g 0 Past Month   losartan-hydrochlorothiazide (HYZAAR) 100-25 MG tablet Take 1 tablet by mouth daily. 90 tablet 1 07/23/2021   meclizine (ANTIVERT) 25 MG tablet TAKE 1 TABLET BY MOUTH THREE TIMES A DAY AS NEEDED (Patient taking differently: Take 25 mg by mouth 3 (three) times daily as needed for dizziness.) 90 tablet 5 07/23/2021   metFORMIN (GLUCOPHAGE-XR) 500 MG 24 hr tablet Take  1 tablet (500 mg total) by mouth 2 (two) times daily with a meal. 180 tablet 1 07/23/2021   montelukast (SINGULAIR) 10 MG tablet TAKE 1 TABLET BY MOUTH EVERYDAY AT BEDTIME (Patient taking differently: Take 10 mg by mouth at bedtime.) 90 tablet 3 Past Week   pantoprazole (PROTONIX) 40 MG tablet TAKE 1 TABLET BY MOUTH EVERY DAY (Patient taking differently: Take 40 mg by mouth daily.) 90 tablet 1 07/23/2021   PROAIR HFA 108 (90 Base) MCG/ACT inhaler TAKE 2 PUFFS BY MOUTH EVERY 6 HOURS AS NEEDED FOR WHEEZE OR SHORTNESS OF BREATH (Patient taking differently: Inhale 2 puffs into the lungs every 6 (six) hours as needed for wheezing or  shortness of breath.) 8.5 Inhaler 2 UNK   Psyllium (METAMUCIL PO) Take by mouth at bedtime. gummies   Past Week   RESTASIS 0.05 % ophthalmic emulsion INSTILL 1 DROP INTO BOTH EYES TWICE A DAY (Patient taking differently: Place 1 drop into both eyes in the morning.) 60 mL 1 Past Week   rosuvastatin (CRESTOR) 20 MG tablet TAKE 1 TABLET BY MOUTH EVERY DAY (Patient taking differently: Take 20 mg by mouth daily.) 90 tablet 3 Past Week   SYMBICORT 160-4.5 MCG/ACT inhaler TAKE 2 PUFFS BY MOUTH TWICE A DAY (Patient taking differently: Inhale 2 puffs into the lungs 2 (two) times daily as needed (wheezing, shortness of breath).) 30.6 each 4 Past Week   warfarin (COUMADIN) 4 MG tablet Take 1/2 tablet all days of the week, EXCEPT on Wednesdays and Saturdays, take one (1) tablet on these days. (Patient taking differently: Take 2-4 mg by mouth daily. 4 mg Monday, Wednesday, Friday 2 mg Sunday, Tuesday, Thursday, Saturday) 60 tablet 1 07/22/2021 at 18:00   carvedilol (COREG) 12.5 MG tablet Take 1 tablet (12.5 mg total) by mouth 2 (two) times daily with a meal. 180 tablet 1 07/22/2021 at 09:00   tirzepatide [facility]) 2.5 MG/0.5ML Pen Inject 2.5 mg into the skin once a week. 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 Elevated INR likely due to poor PO intake, N/V/D and infection.   H&H stable, no bleeding reported   PTA Warfarin 4 mg M/W/F, 2mg  S/Tu/Th  Interacting medications: Bactrim starting 7/15   Goal of Therapy:  INR 2-3 Monitor platelets by anticoagulation protocol: Yes   Plan:  - Continue to hold Warfarin today, pharmacy will monitor and reassess - Monitor H&H, s/s of bleeding, INR, dietary intake   8/15, PharmD PGY1 Pharmacy  Resident 07/24/2021 1:49 PM

## 2021-07-24 NOTE — Evaluation (Signed)
Occupational Therapy Evaluation Patient Details Name: Caroline [name] MRN: [id number] DOB: 08/08/1945 Today's Date: 07/24/2021   History of Present Illness 41 y presents to ED 07/23/21 with c/o dizziness. 10 days prior developed nausea, vomiting, diarrhea and worsening weakness. 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 Pt limited by c/o dizziness from vertigo. PTA pt lived with granddaughter who works during the day in single story home with 1 step to enter. Pt reports that she was Ind with ADLs/selfcare, cooking and used a cane for mobility. Pt also reports increasing dizziness and spent the last 10 days in a recliner, unable to keep her Meclizine down due to vomiting, so her BBPV would make her so dizzy she could not get up. Pt currently requires mod  with LB ADLs, min guard A with UB ADLs, min A with mobility and transfers using RW. Pt would benefit from acute OT services to address impairments to maximize level of function and safety     Recommendations for follow up therapy are one component of a multi-disciplinary discharge planning process, led by the attending physician.  Recommendations may be updated based on patient status, additional functional criteria and insurance authorization.   Follow Up Recommendations  Skilled nursing-short term rehab (<3 hours/day)    Assistance Recommended at Discharge Frequent or constant Supervision/Assistance  Patient can return home with the following A lot of help with bathing/dressing/bathroom;A little help with walking and/or transfers;Assist for transportation;Assistance with cooking/housework    Functional Status Assessment  Patient has had a recent decline in their functional status and demonstrates the ability to make significant improvements in  function in a reasonable and predictable amount of time.  Equipment Recommendations  None recommended by OT    Recommendations for Other Services       Precautions / Restrictions Precautions Precautions: Fall Restrictions Weight Bearing Restrictions: No      Mobility Bed Mobility Overal bed mobility: Needs Assistance Bed Mobility: Supine to Sit, Sit to Supine     Supine to sit: Min assist, HOB elevated Sit to supine: Min assist   General bed mobility comments: min A for managing LE across bed and bringing trunk to upright    Transfers     Transfers: Sit to/from Stand, Bed to chair/wheelchair/BSC Sit to Stand: Min assist     Step pivot transfers: Min assist     General transfer comment: good power up, min A for steadying at RW and for steadying with stepping to [facility]      Balance Overall balance assessment: Needs assistance Sitting-balance support: Feet supported, No upper extremity supported Sitting balance-Leahy Scale: Fair     Standing balance support: Bilateral upper extremity supported Standing balance-Leahy Scale: Poor                             ADL either performed or assessed with clinical judgement   ADL Overall ADL's : Needs assistance/impaired Eating/Feeding: Independent;Sitting   Grooming: Wash/dry hands;Wash/dry face;Standing;Min guard   Upper Body Bathing: Min guard;Sitting   Lower Body Bathing: Moderate assistance   Upper Body Dressing : Min guard;Sitting   Lower Body Dressing: Moderate assistance   Toilet Transfer: Minimal assistance;Ambulation;Cueing for safety;BSC/3in1;Rolling walker (2 wheels)   Toileting- Clothing Manipulation and Hygiene: Moderate assistance;Sit to/from  stand       Functional mobility during ADLs: Minimal assistance;Rolling walker (2 wheels);Cueing for safety       Vision Baseline Vision/History: 1 Wears glasses Ability to See in Adequate Light: 0 Adequate Patient Visual Report: No change from  baseline       Perception     Praxis      Pertinent Vitals/Pain Pain Assessment Pain Assessment: 0-10 Pain Score: 3  Pain Location: neck Pain Descriptors / Indicators: Grimacing, Guarding Pain Intervention(s): Monitored during session, Repositioned, Premedicated before session     Hand Dominance Right   Extremity/Trunk Assessment Upper Extremity Assessment Upper Extremity Assessment: Generalized weakness   Lower Extremity Assessment Lower Extremity Assessment: Defer to PT evaluation RLE Deficits / Details: ROM limited by body habitus, strength grossly 3+/5 RLE Coordination: decreased fine motor LLE Deficits / Details: ROM limited by body habitus, strength grossly 3+/5 LLE Coordination: decreased fine motor       Communication Communication Communication: No difficulties   Cognition Arousal/Alertness: Awake/alert Behavior During Therapy: WFL for tasks assessed/performed Overall Cognitive Status: Within Functional Limits for tasks assessed                                       General Comments  VSS on RA, no dizziness with movement    Exercises     Shoulder Instructions      Home Living Family/patient expects to be discharged to:: Private residence Living Arrangements: Other relatives Available Help at Discharge: Family;Available PRN/intermittently Type of Home: House Home Access: Stairs to enter [employer] of Steps: 1   Home Layout: One level     Bathroom Shower/Tub: Chief Strategy Officer: Standard Bathroom Accessibility: Yes How Accessible: Accessible via wheelchair Home Equipment: Tub bench;Hand held shower head;BSC/3in1;Cane - single point;Wheelchair - manual   Additional Comments: uses wheelchair to get to car and for      Prior Functioning/Environment Prior Level of Function : Independent/Modified Independent             Mobility Comments: using cane for limited community ambulation 2weeks ago,  requiring RW for household ambulation and wheelchair for limited community ambulation ADLs Comments: independent with ADLs and iADLs until recent illness requiring set up and BSC        OT Problem List: Decreased strength;Impaired balance (sitting and/or standing);Pain;Decreased activity tolerance;Decreased coordination      OT Treatment/Interventions: Self-care/ADL training;DME and/or AE instruction;Therapeutic activities;Balance training;Therapeutic exercise;Patient/family education;Energy conservation    OT Goals(Current goals can be found in the care plan section) Acute Rehab OT Goals Patient Stated Goal: go home OT Goal Formulation: With patient/family Time For Goal Achievement: 08/07/21 Potential to Achieve Goals: Good ADL Goals Pt Will Perform Grooming: with supervision;with set-up;standing Pt Will Perform Upper Body Bathing: with supervision;with set-up;sitting Pt Will Perform Lower Body Bathing: with min assist;with min guard assist;sitting/lateral leans;sit to/from stand Pt Will Perform Upper Body Dressing: with supervision;with set-up;sitting Pt Will Transfer to Toilet: with min guard assist;with supervision;ambulating;bedside commode;regular height toilet Pt Will Perform Toileting - Clothing Manipulation and hygiene: with min assist;with min guard assist;sit to/from stand  OT Frequency: Min 2X/week    Co-evaluation              AM-PAC OT "6 Clicks" Daily Activity     Outcome Measure Help from another person eating meals?: None Help from another person taking care of personal grooming?: A Little Help from  another person toileting, which includes using toliet, bedpan, or urinal?: A Lot Help from another person bathing (including washing, rinsing, drying)?: A Lot Help from another person to put on and taking off regular upper body clothing?: A Little Help from another person to put on and taking off regular lower body clothing?: A Lot 6 Click Score: 16   End of  Session Equipment Utilized During Treatment: Gait belt;Rolling walker (2 wheels);Other (comment) (BSC)  Activity Tolerance: Patient limited by fatigue Patient left: in bed;with call bell/phone within reach;with family/visitor present  OT Visit Diagnosis: Unsteadiness on feet (R26.81);Other abnormalities of gait and mobility (R26.89);Muscle weakness (generalized) (M62.81);Pain Pain - part of body:  (neck)                Time: 2952-8413 OT Time Calculation (min): 27 min Charges:  OT General Charges $OT Visit: 1 Visit OT Evaluation $OT Eval Moderate Complexity: 1 Mod OT Treatments $Therapeutic Activity: 8-22 mins    Galen Manila 07/24/2021, 12:24 PM

## 2021-07-24 NOTE — Progress Notes (Signed)
Patient has declined ordered orthostatic vitals on two different times of the day, both times stating she is too tired.

## 2021-07-24 NOTE — Progress Notes (Signed)
NEW ADMISSION NOTE New Admission Note:   Arrival Method: Stretcher from ED Mental Orientation: Alert and Oriented x4 Telemetry: None Assessment: Initiated  Skin: Intact IV: Left A.C with Normal Saline at 125 mL/hr Pain: 4/10. Provided with pain medication Tubes: None Safety Measures: Safety Fall Prevention Plan has been given, discussed and signed Admission: Initiated [facility] [place] Orientation: Patient has been orientated to the room, unit and staff.  Family: Daughter at the bedside.   Orders have been reviewed and implemented. Will continue to monitor the patient. Call light has been placed within reach and bed alarm has been activated.   Daneil Dan, RN

## 2021-07-24 NOTE — Progress Notes (Addendum)
   Subjective:   She feels a lot better today, pain with urination is improved. She is not going to urinate as frequently anymore. Has been tolerating some PO intake, encouraged to eat and drink more. Passed a BM.  She was not able to eat or drink fluids at home prior to coming in because of nausea, but this is improved with the zofran.   Objective:  Vital signs in last 24 hours: Vitals:   07/24/21 0240 07/24/21 0644 07/24/21 0745 07/24/21 0950  BP: (!) 103/53 (!) 105/58  (!) 105/45  Pulse: 70 72  67  Resp: 18 16  20   Temp: 98.4 F (36.9 C) 98.5 F (36.9 C)  98.2 F (36.8 C)  TempSrc:  Oral  Oral  SpO2: 97% 98% 97% 98%  Weight:      Height:       General: well-appearing elderly female, sitting in bed, NAD. CV: normal rate and regular rhythm, no m/r/g. Pulm: CTABL, normal WOB on RA. Abdomen: soft, nondistended, nontender, normoactive bowel sounds. MSK: normal bulk and tone. Neuro: AAOx3, no focal deficits noted. Skin: warm and dry.  Assessment/Plan:  Principal Problem:   Urinary tract infection  Nausea, Vomiting - improved Hypotension Generalized weakness AKI UTI Patient with poor PO intake prior to arrival due to N/V resulting in hypotension which has since improved. She does have some generalized weakness likely due to this. UA showing bacteria and leukocytes which is concerning for UTI given dysuria and urinary frequency. We are treating empirically with antibiotics. Unfortunately, urine cultures not collected prior to starting cefepime so will treat for course of 5 days. She is hemodynamically stable. Will transition to bactrim DS BID (CrCl >30 so no dosage changes needed) to complete therapy. She did develop an AKI, likely prerenal from poor PO intake so will give some IVF rehydration while encourage PO intake today. -transition to bactrim DS BID (allergic to penicillins) -zofran for nausea -encourage PO intake, will also provide 500cc bolus today for  rehydration -holding BP medications in the setting of hypotension -orthostatics, holding meclizine -bladder scan to r/o retention although had 4 unmeasured urine occurrences so unlikely -f/u renal U/S -PT/OT  Recurrent DVT on coumadin INR 4.0 this morning despite holding coumadin yesterday. Likely due to underlying infection and decreased PO intake. No evidence of bleeding.  -hold warfarin for now -trend INR -treat UTI, encourage PO intake  Hypokalemia Hypomagnesemia -repleting -repeat K this afternoon -bmp and mag tomorrow  Microcytic anemia Iron studies suggestive of iron deficiency. -supplementation in outpatient setting after resolution of active infection  Chronic combined HF LVEF 40-45%, no evidence of volume overload on exam today. Needs some gentle hydration as she appears clinically dry.  Prior to Admission Living Arrangement: Home Anticipated Discharge Location: pending PT/OT eval Barriers to Discharge: continued medical management Dispo: Anticipated discharge in approximately 1-2 day(s).   , MD 07/24/2021, 11:14 AM Pager: [phone] After 5pm on weekdays and 1pm on weekends: On Call pager [phone]

## 2021-07-24 NOTE — Progress Notes (Signed)
PT Cancellation Note  Patient Details Name: Caroline [name] MRN: [id number] DOB: Dec 11, 1945   Cancelled Treatment:    Reason Eval/Treat Not Completed: (P) Patient at procedure or test/unavailable Pt working with pharmacist. PT will follow back after seeing next patient.  Shaquasha Gerstel B. Beverely Risen PT, DPT Acute Rehabilitation Services Please use secure chat or  Call Office [phone]    Elon Alas [facility] 07/24/2021, 9:20 AM

## 2021-07-24 NOTE — TOC Initial Note (Addendum)
Transition of Care [facility]) - Initial/Assessment Note    Patient Details  Name: Caroline [name] MRN: [id number] Date of Birth: 01-Jan-1946  Transition of Care [facility]) CM/SW Contact:    Bess Kinds, RN Phone Number: [phone] 07/24/2021, 4:38 PM  Clinical Narrative:                  Spoke with patient, daughter, and daughter in law at the bedside to discuss post acute transition. Patient from home with family. She has a walker, bsc, cane, and wheelchair at home. Family to transport home at discharge in private vehicle. Discussed recommendations for SNF - patient initially stated that she would prefer to go to [facility]; however, while discussing transition plans stated that her first choice was to go home with [facility] PT/OT and family support. Daughter was in agreement with this decision. Choice for [facility] agency offered - no preference. TOC following for transition needs.   UPDATE 1704: Referral for PT/OT accepted by [facility]. Patient will need HH orders with Face to Face at discharge.   Expected Discharge Plan: Home w Home Health Services Barriers to Discharge: Continued Medical Work up   Patient Goals and CMS Choice Patient states their goals for this hospitalization and ongoing recovery are:: return home with CMS Medicare.gov Compare Post Acute Care list provided to:: Patient Choice offered to / list presented to : Patient  Expected Discharge Plan and Services Expected Discharge Plan: Home w Home Health Services In-house Referral: Clinical Social Work Discharge Planning Services: CM Consult Post Acute Care Choice: Home Health Living arrangements for the past 2 months: Single Family Home                                      Prior Living Arrangements/Services Living arrangements for the past 2 months: Single Family Home Lives with:: Self, Adult Children Patient language and need for interpreter reviewed:: Yes Do you feel safe going back to the place where you live?: Yes       Need for Family Participation in Patient Care: Yes (Comment) Care giver support system in place?: Yes (comment) Current home services: DME Criminal Activity/Legal Involvement Pertinent to Current Situation/Hospitalization: No - Comment as needed  Activities of Daily Living Home Assistive Devices/Equipment: Environmental consultant (specify type), Cane (specify quad or straight) ADL Screening (condition at time of admission) Patient's cognitive ability adequate to safely complete daily activities?: Yes Is the patient deaf or have difficulty hearing?: No Does the patient have difficulty seeing, even when wearing glasses/contacts?: No Does the patient have difficulty concentrating, remembering, or making decisions?: No Patient able to express need for assistance with ADLs?: Yes Does the patient have difficulty dressing or bathing?: No Independently performs ADLs?: Yes (appropriate for developmental age) Does the patient have difficulty walking or climbing stairs?: No Weakness of Legs: Both Weakness of Arms/Hands: Both  Permission Sought/Granted                  Emotional Assessment Appearance:: Appears stated age Attitude/Demeanor/Rapport: Engaged Affect (typically observed): Accepting Orientation: : Oriented to Self, Oriented to Place, Oriented to  Time, Oriented to Situation Alcohol / Substance Use: Not Applicable Psych Involvement: No (comment)  Admission diagnosis:  Hypokalemia [E87.6] Weakness [R53.1] Nausea, vomiting, and diarrhea [R11.2, R19.7] Urinary tract infection without hematuria, site unspecified [N39.0] Patient Active Problem List   Diagnosis Date Noted   Urinary tract infection 07/24/2021   Nausea, vomiting,  and diarrhea 07/23/2021   Positive fecal immunochemical test 07/28/2020   Elevated INR    Diverticulitis 12/20/2018   GERD (gastroesophageal reflux disease) 01/30/2018   Hx of bee sting allergy 10/31/2017   Depression 10/31/2017   Asthma, chronic, mild persistent,  uncomplicated 08/29/2017   Type 2 diabetes mellitus with complication, without long-term current use of insulin (HCC) 06/02/2017   Hypokalemia 05/01/2017   History of Clostridium difficile colitis 12/31/2016   Anticoagulated on warfarin    Orthostatic hypotension    Lipodermatosclerosis 10/11/2016   Morbid obesity with BMI of 50.0-59.9, adult (HCC) 05/02/2016   Chronic combined systolic and diastolic CHF, NYHA class 1 (HCC) 09/21/2015   DVT, lower extremity, recurrent (HCC)    Vertigo 09/13/2011   Healthcare maintenance 09/13/2011   Allergic rhinitis 06/21/2006   Hyperlipidemia 06/10/2006   Essential hypertension 06/10/2006   PCP:  Earl Lagos, MD Pharmacy:   [id number] - [place], [place] - [address] [place] [address] Phone: [phone] Fax: [phone]     Social Determinants of Health (SDOH) Interventions    Readmission Risk Interventions    01/02/2019   10:23 AM  Readmission Risk Prevention Plan  Transportation Screening Complete  PCP or Specialist Appt within 5-7 Days Complete  Home Care Screening Complete  Medication Review (RN CM) Referral to Pharmacy

## 2021-07-24 NOTE — Care Management Obs Status (Signed)
MEDICARE OBSERVATION STATUS NOTIFICATION   Patient Details  Name: Caroline [name] MRN: [id number] Date of Birth: 07-Dec-1945   Medicare Observation Status Notification Given:  Yes    Bess Kinds, RN 07/24/2021, 4:24 PM

## 2021-07-25 ENCOUNTER — Telehealth: Payer: Self-pay | Admitting: Pharmacist

## 2021-07-25 DIAGNOSIS — N3000 Acute cystitis without hematuria: Secondary | ICD-10-CM | POA: Diagnosis not present

## 2021-07-25 DIAGNOSIS — N390 Urinary tract infection, site not specified: Secondary | ICD-10-CM | POA: Diagnosis not present

## 2021-07-25 LAB — PROTIME-INR
INR: 3.9 — ABNORMAL HIGH (ref 0.8–1.2)
Prothrombin Time: 38.2 seconds — ABNORMAL HIGH (ref 11.4–15.2)

## 2021-07-25 LAB — BASIC METABOLIC PANEL
Anion gap: 9 (ref 5–15)
BUN: 13 mg/dL (ref 8–23)
CO2: 26 mmol/L (ref 22–32)
Calcium: 9.2 mg/dL (ref 8.9–10.3)
Chloride: 105 mmol/L (ref 98–111)
Creatinine, Ser: 1.06 mg/dL — ABNORMAL HIGH (ref 0.44–1.00)
GFR, Estimated: 55 mL/min — ABNORMAL LOW (ref >60)
Glucose, Bld: 124 mg/dL — ABNORMAL HIGH (ref 70–99)
Potassium: 3.6 mmol/L (ref 3.5–5.1)
Sodium: 140 mmol/L (ref 135–145)

## 2021-07-25 LAB — MAGNESIUM: Magnesium: 1.6 mg/dL — ABNORMAL LOW (ref 1.7–2.4)

## 2021-07-25 MED ORDER — SULFAMETHOXAZOLE-TRIMETHOPRIM 800-160 MG PO TABS
1.0000 | ORAL_TABLET | Freq: Two times a day (BID) | ORAL | 0 refills | Status: AC
Start: 2021-07-25 — End: 2021-07-27

## 2021-07-25 MED ORDER — METHOCARBAMOL 500 MG PO TABS: 500.0000 mg | ORAL_TABLET | Freq: Two times a day (BID) | ORAL | 0 refills | Status: DC | PRN

## 2021-07-25 MED ORDER — MAGNESIUM SULFATE 2 GM/50ML IV SOLN
2.0000 g | Freq: Once | INTRAVENOUS | Status: AC
  Administered 2021-07-25: 2 g via INTRAVENOUS
  Filled 2021-07-25: qty 50

## 2021-07-25 MED ORDER — DICLOFENAC SODIUM 1 % EX GEL: 4.0000 g | Freq: Four times a day (QID) | CUTANEOUS | 0 refills | Status: DC

## 2021-07-25 MED ORDER — POTASSIUM CHLORIDE CRYS ER 20 MEQ PO TBCR
40.0000 meq | EXTENDED_RELEASE_TABLET | Freq: Once | ORAL | Status: AC
Start: 2021-07-25 — End: 2021-07-25
  Administered 2021-07-25: 40 meq via ORAL
  Filled 2021-07-25: qty 2

## 2021-07-25 MED ORDER — METHOCARBAMOL 500 MG PO TABS
500.0000 mg | ORAL_TABLET | Freq: Two times a day (BID) | ORAL | Status: DC | PRN
Start: 2021-07-25 — End: 2021-07-26
  Administered 2021-07-25: 500 mg via ORAL
  Filled 2021-07-25: qty 1

## 2021-07-25 MED ORDER — DICLOFENAC SODIUM 1 % EX GEL
4.0000 g | Freq: Four times a day (QID) | CUTANEOUS | Status: DC
  Administered 2021-07-25 (×2): 4 g via TOPICAL
  Filled 2021-07-25: qty 100

## 2021-07-25 MED ORDER — LOSARTAN POTASSIUM-HCTZ 100-25 MG PO TABS: 1.0000 | ORAL_TABLET | Freq: Every day | ORAL | 0 refills | Status: DC

## 2021-07-25 MED ORDER — CARVEDILOL 12.5 MG PO TABS: 12.5000 mg | ORAL_TABLET | Freq: Two times a day (BID) | ORAL | 0 refills | Status: DC

## 2021-07-25 NOTE — TOC Transition Note (Signed)
Transition of Care [facility]) - CM/SW Discharge Note   Patient Details  Name: Caroline [name] MRN: [id number] Date of Birth: 1945/03/02  Transition of Care [facility]) CM/SW Contact:  Bess Kinds, RN Phone Number: [phone] 07/25/2021, 2:24 PM   Clinical Narrative:     Patient to transition home today. Home health orders in place. Liaison, Marylene Land, at [place] notified. No further TOC needs identified.   Final next level of care: Home w Home Health Services Barriers to Discharge: No Barriers Identified   Patient Goals and CMS Choice Patient states their goals for this hospitalization and ongoing recovery are:: return home with CMS Medicare.gov Compare Post Acute Care list provided to:: Patient Choice offered to / list presented to : Patient  Discharge Placement                       Discharge Plan and Services In-house Referral: Clinical Social Work Discharge Planning Services: CM Consult Post Acute Care Choice: Home Health                    HH Arranged: PT, OT [facility] Agency: [facility] Date [facility] Agency Contacted: 07/25/21 Time HH Agency Contacted: 1424 Representative spoke with at [facility] Agency: Marylene Land  Social Determinants of Health (SDOH) Interventions     Readmission Risk Interventions    01/02/2019   10:23 AM  Readmission Risk Prevention Plan  Transportation Screening Complete  PCP or Specialist Appt within 5-7 Days Complete  Home Care Screening Complete  Medication Review (RN CM) Referral to Pharmacy

## 2021-07-25 NOTE — Progress Notes (Signed)
Pt discharged to home. DC instructions givne with daughter ta bedside. No concerns voiced. Pt and daughter encouraged to stop by pharmacy and pick up meds that were e-prescribed by MD. Both voiced understanding. Pt left unit in wheelchiar pushed by this Clinical research associate. Left in stable condition.

## 2021-07-25 NOTE — Discharge Instructions (Signed)
  Dear Caroline [name]   You came to the hospital because you felt weak, had nausea and vomiting. You were admitted because you were found to have a urinary tract infection, poor kidney function, and your blood thinner labs were abnormal. We have treated the infection with antibiotics in your veins and oral, you should be able to complete the course by tomorrow 7/17. Your kidney function recovered after you were able to eat and drink more. Your blood thinner laboratory results are still higher than we want, so we would like you to stop taking Coumadin until you are able to see Dr. Alexandria Lodge in clinic. Please call your coumadin clinic to make an appointment with Dr. Alexandria Lodge or the nurses that work with him tomorrow, Monday 7/17 or Tuesday 7/18, at the latest. It is important that they recheck your labs and evaluate you to re-start coumadin therapy.  Your clinical symptoms have improved and you are now stable and are being discharged from the hospital.  It is important that you follow up with your primary care provider to monitor your symptoms and medication changes. The [facility] clinic should call you to schedule an appointment. If you have not heard back by Tuesday 7/18, please call to be seen within the next week.  It was our pleasure being a part of your care team.  MEDICATIONS CHANGES  STOP TAKING Coumadin - until your doctor sees you again and reoders labs   New Bactrim - take one dose tonight 7/16, one tomorrow 7/17 morning and night.   Take care,  Your [facility] Inpatient team

## 2021-07-25 NOTE — Progress Notes (Signed)
ANTICOAGULATION CONSULT NOTE  Pharmacy Consult for Warfarin Indication: Recurrent DVT  Allergies  Allergen Reactions   Penicillins Anaphylaxis, Hives, Swelling and Rash    Has patient had a PCN reaction causing immediate rash, facial/tongue/throat swelling, SOB or lightheadedness with hypotension: Yes Has patient had a PCN reaction causing severe rash involving mucus membranes or skin necrosis: Yes Has patient had a PCN reaction that required hospitalization Yes Has patient had a PCN reaction occurring within the last 10 years: No If all of the above answers are "NO", then may proceed with Cephalosporin use.    Cabbage Itching   Keflex [Cephalexin] Hives    And feeling of throat tightness   Other Itching    Lettuce - itching   Shellfish Allergy Swelling   Tomato Swelling   Latex Itching and Rash    Patient Measurements: Height: 5\' 2"  (157.5 cm) Weight: 113.4 kg (250 lb) IBW/kg (Calculated) : 50.1  Vital Signs: Temp: 98.3 F (36.8 C) (07/16 0447) Temp Source: Oral (07/16 0447) BP: 127/59 (07/16 0447) Pulse Rate: 73 (07/16 0447)  Labs: Recent Labs    07/23/21 1217 07/23/21 1403 07/24/21 0227 07/25/21 0113  HGB 10.9*  --  10.1*  --   HCT 35.2*  --  32.0*  --   PLT 264  --  228  --   LABPROT 37.9*  --  38.6* 38.2*  INR 3.9*  --  4.0* 3.9*  CREATININE 1.04*  --  1.47* 1.06*  TROPONINIHS 11 10  --   --      Estimated Creatinine Clearance: 55.4 mL/min (A) (by C-G formula based on SCr of 1.06 mg/dL (H)).   Medical History: Past Medical History:  Diagnosis Date   Arthritis    Asthma    Bronchitis    CHF (congestive heart failure) (HCC)    Diabetes (HCC) 2019   Diverticulitis    DVT, lower extremity, recurrent (HCC)    Patient had unprovoked PE on 2002 and DVT in right lower extremety 2008.   Frequent urination 03/21/2019   GERD (gastroesophageal reflux disease)    Hematuria, microscopic 09/13/2017   History of kidney stones    Hypertension    Lower  abdominal pain 02/20/2018   PE (pulmonary embolism)    Patient had unprovoked PE on 2002   PONV (postoperative nausea and vomiting)    Vertigo     Medications:  Medications Prior to Admission  Medication Sig Dispense Refill Last Dose   acetaminophen (TYLENOL) 500 MG tablet Take 500 mg by mouth 4 (four) times daily as needed for mild pain or headache.   Past Week   cholecalciferol (VITAMIN D) 25 MCG (1000 UNIT) tablet TAKE 1 TABLET BY MOUTH EVERY DAY (Patient taking differently: Take 1,000 Units by mouth daily.) 90 tablet 1 07/23/2021   fluticasone (FLONASE) 50 MCG/ACT nasal spray PLACE 2 SPRAYS DAILY INTO BOTH NOSTRILS. (Patient taking differently: Place 2 sprays into both nostrils daily as needed for allergies.) 48 mL 1 Past Week   hydrocortisone (PROCTOSOL HC) 2.5 % rectal cream Place 1 application rectally 2 (two) times daily. (Patient taking differently: Place 1 application  rectally 2 (two) times daily as needed for hemorrhoids.) 30 g 0 Past Month   losartan-hydrochlorothiazide (HYZAAR) 100-25 MG tablet Take 1 tablet by mouth daily. 90 tablet 1 07/23/2021   meclizine (ANTIVERT) 25 MG tablet TAKE 1 TABLET BY MOUTH THREE TIMES A DAY AS NEEDED (Patient taking differently: Take 25 mg by mouth 3 (three) times daily as needed for  dizziness.) 90 tablet 5 07/23/2021   metFORMIN (GLUCOPHAGE-XR) 500 MG 24 hr tablet Take 1 tablet (500 mg total) by mouth 2 (two) times daily with a meal. 180 tablet 1 07/23/2021   montelukast (SINGULAIR) 10 MG tablet TAKE 1 TABLET BY MOUTH EVERYDAY AT BEDTIME (Patient taking differently: Take 10 mg by mouth at bedtime.) 90 tablet 3 Past Week   pantoprazole (PROTONIX) 40 MG tablet TAKE 1 TABLET BY MOUTH EVERY DAY (Patient taking differently: Take 40 mg by mouth daily.) 90 tablet 1 07/23/2021   PROAIR HFA 108 (90 Base) MCG/ACT inhaler TAKE 2 PUFFS BY MOUTH EVERY 6 HOURS AS NEEDED FOR WHEEZE OR SHORTNESS OF BREATH (Patient taking differently: Inhale 2 puffs into the lungs every 6  (six) hours as needed for wheezing or shortness of breath.) 8.5 Inhaler 2 UNK   Psyllium (METAMUCIL PO) Take by mouth at bedtime. gummies   Past Week   RESTASIS 0.05 % ophthalmic emulsion INSTILL 1 DROP INTO BOTH EYES TWICE A DAY (Patient taking differently: Place 1 drop into both eyes in the morning.) 60 mL 1 Past Week   rosuvastatin (CRESTOR) 20 MG tablet TAKE 1 TABLET BY MOUTH EVERY DAY (Patient taking differently: Take 20 mg by mouth daily.) 90 tablet 3 Past Week   SYMBICORT 160-4.5 MCG/ACT inhaler TAKE 2 PUFFS BY MOUTH TWICE A DAY (Patient taking differently: Inhale 2 puffs into the lungs 2 (two) times daily as needed (wheezing, shortness of breath).) 30.6 each 4 Past Week   warfarin (COUMADIN) 4 MG tablet Take 1/2 tablet all days of the week, EXCEPT on Wednesdays and Saturdays, take one (1) tablet on these days. (Patient taking differently: Take 2-4 mg by mouth daily. 4 mg Monday, Wednesday, Friday 2 mg Sunday, Tuesday, Thursday, Saturday) 60 tablet 1 07/22/2021 at 18:00   carvedilol (COREG) 12.5 MG tablet Take 1 tablet (12.5 mg total) by mouth 2 (two) times daily with a meal. 180 tablet 1 07/22/2021 at 09:00   tirzepatide [facility]) 2.5 MG/0.5ML Pen Inject 2.5 mg into the skin once a week. (Patient not taking: Reported on 06/21/2021) 2 mL 0 Not Taking    Assessment: 6 yof with a hx of recurrent DVTs on warfarin presenting with complaints of dizziness and admitted for weakness with N/V/D. INR of 4 on 7/14 likely due to poor PO intake, N/V/D and infection.   7/16: INR elevated at 3.9 after 2 day hold. Pt resumed eating full meals, s/s of infection improving. H&H stable, no bleeding reported. Discharge with home health planned for this afternoon  Interacting medications: Bactrim starting 7/15   PTA Warfarin 4 mg M/W/F, 2mg  S/Tu/Th  Goal of Therapy:  INR 2-3 Monitor platelets by anticoagulation protocol: Yes   Plan:  - Hold Warfarin today - F/u appointment scheduled with Dr. at the  coumadin clinic on Monday for INR check - After discharge monitor H&H, s/s of bleeding, INR, dietary intake   Friday, PharmD PGY1 Pharmacy Resident 07/25/2021 3:48 PM

## 2021-07-25 NOTE — Discharge Summary (Signed)
Name: Caroline [name] MRN: [id number] DOB: [birth year] 76 y.o. PCP: Earl Lagos, MD  Date of Admission: 07/23/2021 11:32 AM Date of Discharge: 07/25/2021 9:55 AM Attending Physician: Dr. Heide Spark  Discharge Diagnosis: Principal Problem:   Urinary tract infection    Discharge Medications: Allergies as of 07/25/2021       Reactions   Penicillins Anaphylaxis, Hives, Swelling, Rash   Has patient had a PCN reaction causing immediate rash, facial/tongue/throat swelling, SOB or lightheadedness with hypotension: Yes Has patient had a PCN reaction causing severe rash involving mucus membranes or skin necrosis: Yes Has patient had a PCN reaction that required hospitalization Yes Has patient had a PCN reaction occurring within the last 10 years: No If all of the above answers are "NO", then may proceed with Cephalosporin use.   Cabbage Itching   Keflex [cephalexin] Hives   And feeling of throat tightness   Other Itching   Lettuce - itching   Shellfish Allergy Swelling   Tomato Swelling   Latex Itching, Rash        Medication List     STOP taking these medications    warfarin 4 MG tablet Commonly known as: COUMADIN       TAKE these medications    acetaminophen 500 MG tablet Commonly known as: TYLENOL Take 500 mg by mouth 4 (four) times daily as needed for mild pain or headache.   carvedilol 12.5 MG tablet Commonly known as: COREG Take 1 tablet (12.5 mg total) by mouth 2 (two) times daily with a meal.   cholecalciferol 25 MCG (1000 UNIT) tablet Commonly known as: VITAMIN D TAKE 1 TABLET BY MOUTH EVERY DAY   diclofenac Sodium 1 % Gel Commonly known as: VOLTAREN Apply 4 g topically 4 (four) times daily.   fluticasone 50 MCG/ACT nasal spray Commonly known as: FLONASE PLACE 2 SPRAYS DAILY INTO BOTH NOSTRILS. What changed: See the new instructions.   hydrocortisone 2.5 % rectal cream Commonly known as: Proctosol HC Place 1 application rectally 2 (two) times  daily. What changed:  when to take this reasons to take this   losartan-hydrochlorothiazide 100-25 MG tablet Commonly known as: HYZAAR Take 1 tablet by mouth daily.   meclizine 25 MG tablet Commonly known as: ANTIVERT TAKE 1 TABLET BY MOUTH THREE TIMES A DAY AS NEEDED What changed: reasons to take this   METAMUCIL PO Take by mouth at bedtime. gummies   metFORMIN 500 MG 24 hr tablet Commonly known as: GLUCOPHAGE-XR Take 1 tablet (500 mg total) by mouth 2 (two) times daily with a meal.   methocarbamol 500 MG tablet Commonly known as: ROBAXIN Take 1 tablet (500 mg total) by mouth 2 (two) times daily as needed for up to 10 doses for muscle spasms.   montelukast 10 MG tablet Commonly known as: SINGULAIR TAKE 1 TABLET BY MOUTH EVERYDAY AT BEDTIME What changed: See the new instructions.   Mounjaro 2.5 MG/0.5ML Pen Generic drug: tirzepatide Inject 2.5 mg into the skin once a week.   pantoprazole 40 MG tablet Commonly known as: PROTONIX TAKE 1 TABLET BY MOUTH EVERY DAY   ProAir HFA 108 (90 Base) MCG/ACT inhaler Generic drug: albuterol TAKE 2 PUFFS BY MOUTH EVERY 6 HOURS AS NEEDED FOR WHEEZE OR SHORTNESS OF BREATH What changed: See the new instructions.   Restasis 0.05 % ophthalmic emulsion Generic drug: cycloSPORINE INSTILL 1 DROP INTO BOTH EYES TWICE A DAY What changed: when to take this   rosuvastatin 20 MG tablet Commonly known as: CRESTOR  TAKE 1 TABLET BY MOUTH EVERY DAY   sulfamethoxazole-trimethoprim 800-160 MG tablet Commonly known as: BACTRIM DS Take 1 tablet by mouth every 12 (twelve) hours for 3 doses.   Symbicort 160-4.5 MCG/ACT inhaler Generic drug: budesonide-formoterol TAKE 2 PUFFS BY MOUTH TWICE A DAY What changed: See the new instructions.        Disposition and follow-up:   Caroline [name] was discharged from [facility] in Stable condition.  At the hospital follow up visit please address:  1.   Follow-up:  Supratherapeutic INR Recurrent DVT on Coumadin -Currently holding coumadin. No evidence of bleeding -Repeat cbc, INR and reassess re-starting therapy with Coumadin -Evaluate for signs of bleeding  UTI -Patient to complete bactrim therapy as outpatient -Monitor for signs or symptoms of worsening -Re-assess need for cbc or U/A repeat at follow up  AKI Electrolyte abnormalities -Monitor for signs and symptoms of poor PO intake -Monitor BMP   Iron Deficiency Anemia -No acute bleeding -Continue iron supplementation after UTI treatment completion  Chronic combined HF -Monitor for signs or symptoms of fluid overload - Previously holding BP meds in the setting of hypotension, now restarted -Monitor BP, BMP  Cramping neck pain - follow for signs and symptoms of progression -Patient discharged with a 5 day course of Robaxin    2.  Labs / imaging needed at time of follow-up: cbc, bmp, INR/PT  3.  Pending labs/ test needing follow-up: None  4.  Medication Changes  STOPPED  -Coumadin until INR reassessed in clinic     ADDED  -Robaxin, 5 day course for acute msk pain   Follow-up Appointments:  Follow-up Information     [facility] Follow up.   Specialty: Home Health Services Why: the agency is now known as [facility] - someone from the office will call to schedule home health visits for physical/occupational therapy Contact information: [address] [place] [address] [phone]                 Hospital Course by problem list: With a pertinent past medical history of recurrent DVT on warfarin, CHF, HTN, DM who presented with dizziness and admitted forward UTI, AKI, and holding warfarin in the setting of supratherapeutic INR, now stable and being discharged home  UTI Patient presented with new weakness, dizziness and poor po intake. U/A with bacteria and leukocytes and dysuria. Initially treated with cefepime.  Urine cultures revealed klebsiella pneumonia and patient was transitioned to Bactrim after increasing PO intake. Patient will complete therapy for UTI on 7/17. Consider monitor for signs and symptoms of worsening.    Supratherapeutic INR Recurrent DVT on Coumadin Currently holding coumadin and patient's INR was 4 on admission. No evidence of bleeding Cosnider repeat cbc, INR and reassess re-starting therapy with Coumadin. Dr. Alexandria Lodge was contacted and alerted patient was being discharged without coumadin. He reached out to the patient and will be seen in clinic in the next two days.  AKI Electrolyte abnormalities AKI in the setting of low po intake. No evidence of obstruction or hydronephrosis on renal U/S. Improvement after IVF and increased PO intake. Cr. 1.06 today, close to <1 baseline. Continue to monitor for signs and symptoms of poor PO intake Recommend monitoring BMP for electrolyte disturbances  Iron Deficiency Anemia No acute bleeding. Consider continuing iron supplementation after UTI treatment completion  Chronic combined HF LVEF 40-45%. Monitor for signs or symptoms of fluid overload on exam today. Previously holding BP meds in  the setting of hypotension, now restarted.   Discharge Subjective: Eating more and drinking water and gingerale. She was a little nauseated yesterday but none today. No vomiting. She is still having some burning with urination. No stomach pain or cramping.    Discharge Exam:   Blood pressure (!) 127/59, pulse 73, temperature 98.3 F (36.8 C), temperature source Oral, resp. rate 18, height 5\' 2"  (1.575 m), weight 113.4 kg, SpO2 98 %.  Constitutional:well-appearing woman sitting in bed, in no acute distress HENT: normocephalic atraumatic, mucous membranes moist Cardiovascular: regular rate and rhythm, no m/r/g, no JVD Pulmonary/Chest: normal work of breathing on room air, lungs clear to auscultation bilaterally. No crackles  Abdominal: soft, non-tender,  non-distended. Neurological: alert & oriented x 3 MSK: no gross abnormalities. No pitting edema Skin: warm and dry Psych: Normal mood and affect  Pertinent Labs, Studies, and Procedures:     Latest Ref Rng & Units 07/24/2021    2:27 AM 07/23/2021   12:17 PM 01/04/2019    2:08 AM  CBC  WBC 4.0 - 10.5 K/uL 7.8  7.4  7.7   Hemoglobin 12.0 - 15.0 g/dL 10.1  10.9  9.2   Hematocrit 36.0 - 46.0 % 32.0  35.2  29.8   Platelets 150 - 400 K/uL 228  264  203        Latest Ref Rng & Units 07/25/2021    1:13 AM 07/24/2021    2:32 PM 07/24/2021    2:27 AM  CMP  Glucose 70 - 99 mg/dL 124   150   BUN 8 - 23 mg/dL 13   17   Creatinine 0.44 - 1.00 mg/dL 1.06   1.47   Sodium 135 - 145 mmol/L 140   138   Potassium 3.5 - 5.1 mmol/L 3.6  3.6  3.3   Chloride 98 - 111 mmol/L 105   100   CO2 22 - 32 mmol/L 26   28   Calcium 8.9 - 10.3 mg/dL 9.2   8.9   Total Protein 6.5 - 8.1 g/dL   6.8   Total Bilirubin 0.3 - 1.2 mg/dL   0.6   Alkaline Phos 38 - 126 U/L   47   AST 15 - 41 U/L   16   ALT 0 - 44 U/L   14     US RENAL  Result Date: 07/24/2021 CLINICAL DATA:  76 year old female with history of acute kidney injury. EXAM: RENAL / URINARY TRACT ULTRASOUND COMPLETE COMPARISON:  None Available. FINDINGS: Right Kidney: Renal measurements: 10.1 x 4.1 x 4.4 cm = volume: 94 mL. Echogenicity within normal limits. No mass or hydronephrosis visualized. Left Kidney: Renal measurements: 10.6 x 4.7 x 5.2 cm = volume: 136 mL. Echogenicity within normal limits. No mass or hydronephrosis visualized. Bladder: Appears normal for degree of bladder distention. Other: None. IMPRESSION: 1. No hydronephrosis or other acute findings. Normal sonographic appearance of the kidneys and urinary bladder. Electronically Signed   By: Vinnie Langton M.D.   On: 07/24/2021 12:07   CT HEAD WO CONTRAST (5MM)  Result Date: 07/23/2021 CLINICAL DATA:  Posterior headache for the past 10 days.  Dizziness. EXAM: CT HEAD WITHOUT CONTRAST  TECHNIQUE: Contiguous axial images were obtained from the base of the skull through the vertex without intravenous contrast. RADIATION DOSE REDUCTION: This exam was performed according to the departmental dose-optimization program which includes automated exposure control, adjustment of the mA and/or kV according to patient size and/or use of iterative reconstruction technique. COMPARISON:  01/03/2011 FINDINGS: Brain: Minimally enlarged ventricles and subarachnoid spaces. No intracranial hemorrhage, mass lesion or CT evidence of acute infarction. Vascular: No hyperdense vessel or unexpected calcification. Skull: Mild bilateral hyperostosis frontalis.  No fractures. Sinuses/Orbits: Unremarkable. Other: None. IMPRESSION: No acute abnormality. Electronically Signed   By: Beckie Salts M.D.   On: 07/23/2021 13:17     Discharge Instructions:   Signed: Morene Crocker, MD [facility] - PGY1 Pager: [phone] 07/25/2021, 9:55 AM

## 2021-07-25 NOTE — Telephone Encounter (Signed)
Was advised by B-Service Intern Dr. Daiva Eves that the patient is being discharged from inpatient service today with an INR > 4.0 and that the inpatient pharmacists requests an anticoagulation management clinic visit for the patient [id number]. Patient agrees to come to clinic tomorrow, mid-morning.

## 2021-07-26 ENCOUNTER — Ambulatory Visit (INDEPENDENT_AMBULATORY_CARE_PROVIDER_SITE_OTHER): Payer: Medicare Other | Admitting: Pharmacist

## 2021-07-26 ENCOUNTER — Ambulatory Visit: Payer: Medicare Other

## 2021-07-26 VITALS — BP 131/48 | HR 78 | Temp 98.1°F

## 2021-07-26 DIAGNOSIS — Z7901 Long term (current) use of anticoagulants: Secondary | ICD-10-CM | POA: Diagnosis not present

## 2021-07-26 DIAGNOSIS — I82409 Acute embolism and thrombosis of unspecified deep veins of unspecified lower extremity: Secondary | ICD-10-CM

## 2021-07-26 LAB — POCT INR: INR: 3.7 — AB (ref 2.0–3.0)

## 2021-07-26 LAB — URINE CULTURE: Culture: >=100000 — AB

## 2021-07-26 LAB — GLUCOSE, CAPILLARY: Glucose-Capillary: 107 mg/dL — ABNORMAL HIGH (ref 70–99)

## 2021-07-26 NOTE — Patient Instructions (Signed)
Patient instructed to take medications as defined in the Anti-coagulation Track section of this encounter.  Patient instructed to OMIT today's dose.  Patient instructed to OMIT doses of warfarin on Monday, July 17, Tuesday, July 18, and Wednesday, July 28, 2021. Recommence warfarin on Thursday, July 29, 2021 by taking 1/2 of your 4mg  strength, blue-colored warfarin tablet by mouth, once-daily until seen in the Anticoagulation Management Clinic on Monday, July 24th, 2023 at 9:45 am.  Patient verbalized understanding of these instructions.

## 2021-07-26 NOTE — Progress Notes (Signed)
Anticoagulation Management Caroline [name] is a 76 y.o. female who reports to the clinic for monitoring of warfarin treatment.    Indication: DVT with history of recurrences; long term current use of oral anticoagulant, warfarin to target INR 2.0 - 3.0.  Duration: indefinite Supervising physician: Aldine Contes  Anticoagulation Clinic Visit History: Patient does not report signs/symptoms of bleeding or thromboembolism  Other recent changes: NO diet, medications, lifestyle EXCEPT as noted in patient findings.  Anticoagulation Episode Summary     Current INR goal:  2.0-3.0  TTR:  83.8 % (6.4 y)  Next INR check:  08/02/2021  INR from last check:  3.7 (07/26/2021)  Weekly max warfarin dose:    Target end date:    INR check location:  Anticoagulation Clinic  Preferred lab:    Send INR reminders to:  [facility]   Indications   DVT lower extremity recurrent (HCC) [I82.409] PULMONARY EMBOLISM HX OF (Resolved) [Z86.718]        Comments:           Allergies  Allergen Reactions   Penicillins Anaphylaxis, Hives, Swelling and Rash    Has patient had a PCN reaction causing immediate rash, facial/tongue/throat swelling, SOB or lightheadedness with hypotension: Yes Has patient had a PCN reaction causing severe rash involving mucus membranes or skin necrosis: Yes Has patient had a PCN reaction that required hospitalization Yes Has patient had a PCN reaction occurring within the last 10 years: No If all of the above answers are "NO", then may proceed with Cephalosporin use.    Cabbage Itching   Keflex [Cephalexin] Hives    And feeling of throat tightness   Other Itching    Lettuce - itching   Shellfish Allergy Swelling   Tomato Swelling   Latex Itching and Rash    Current Outpatient Medications:    acetaminophen (TYLENOL) 500 MG tablet, Take 500 mg by mouth 4 (four) times daily as needed for mild pain or headache., Disp: , Rfl:    cholecalciferol (VITAMIN D) 25 MCG (1000  UNIT) tablet, TAKE 1 TABLET BY MOUTH EVERY DAY (Patient taking differently: Take 1,000 Units by mouth daily.), Disp: 90 tablet, Rfl: 1   diclofenac Sodium (VOLTAREN) 1 % GEL, Apply 4 g topically 4 (four) times daily., Disp: 4 g, Rfl: 0   fluticasone (FLONASE) 50 MCG/ACT nasal spray, PLACE 2 SPRAYS DAILY INTO BOTH NOSTRILS. (Patient taking differently: Place 2 sprays into both nostrils daily as needed for allergies.), Disp: 48 mL, Rfl: 1   hydrocortisone (PROCTOSOL HC) 2.5 % rectal cream, Place 1 application rectally 2 (two) times daily. (Patient taking differently: Place 1 application  rectally 2 (two) times daily as needed for hemorrhoids.), Disp: 30 g, Rfl: 0   meclizine (ANTIVERT) 25 MG tablet, TAKE 1 TABLET BY MOUTH THREE TIMES A DAY AS NEEDED (Patient taking differently: Take 25 mg by mouth 3 (three) times daily as needed for dizziness.), Disp: 90 tablet, Rfl: 5   metFORMIN (GLUCOPHAGE-XR) 500 MG 24 hr tablet, Take 1 tablet (500 mg total) by mouth 2 (two) times daily with a meal., Disp: 180 tablet, Rfl: 1   methocarbamol (ROBAXIN) 500 MG tablet, Take 1 tablet (500 mg total) by mouth 2 (two) times daily as needed for up to 10 doses for muscle spasms., Disp: 10 tablet, Rfl: 0   montelukast (SINGULAIR) 10 MG tablet, TAKE 1 TABLET BY MOUTH EVERYDAY AT BEDTIME (Patient taking differently: Take 10 mg by mouth at bedtime.), Disp: 90 tablet, Rfl: 3  pantoprazole (PROTONIX) 40 MG tablet, TAKE 1 TABLET BY MOUTH EVERY DAY (Patient taking differently: Take 40 mg by mouth daily.), Disp: 90 tablet, Rfl: 1   PROAIR HFA 108 (90 Base) MCG/ACT inhaler, TAKE 2 PUFFS BY MOUTH EVERY 6 HOURS AS NEEDED FOR WHEEZE OR SHORTNESS OF BREATH (Patient taking differently: Inhale 2 puffs into the lungs every 6 (six) hours as needed for wheezing or shortness of breath.), Disp: 8.5 Inhaler, Rfl: 2   rosuvastatin (CRESTOR) 20 MG tablet, TAKE 1 TABLET BY MOUTH EVERY DAY (Patient taking differently: Take 20 mg by mouth daily.), Disp: 90  tablet, Rfl: 3   sulfamethoxazole-trimethoprim (BACTRIM DS) 800-160 MG tablet, Take 1 tablet by mouth every 12 (twelve) hours for 3 doses., Disp: 3 tablet, Rfl: 0   SYMBICORT 160-4.5 MCG/ACT inhaler, TAKE 2 PUFFS BY MOUTH TWICE A DAY (Patient taking differently: Inhale 2 puffs into the lungs 2 (two) times daily as needed (wheezing, shortness of breath).), Disp: 30.6 each, Rfl: 4   tirzepatide (MOUNJARO) 2.5 MG/0.5ML Pen, Inject 2.5 mg into the skin once a week., Disp: 2 mL, Rfl: 0   carvedilol (COREG) 12.5 MG tablet, Take 1 tablet (12.5 mg total) by mouth 2 (two) times daily with a meal. (Patient not taking: Reported on 07/26/2021), Disp: 60 tablet, Rfl: 0   losartan-hydrochlorothiazide (HYZAAR) 100-25 MG tablet, Take 1 tablet by mouth daily. (Patient not taking: Reported on 07/26/2021), Disp: 30 tablet, Rfl: 0   Psyllium (METAMUCIL PO), Take by mouth at bedtime. gummies (Patient not taking: Reported on 07/26/2021), Disp: , Rfl:    RESTASIS 0.05 % ophthalmic emulsion, INSTILL 1 DROP INTO BOTH EYES TWICE A DAY (Patient not taking: Reported on 07/26/2021), Disp: 60 mL, Rfl: 1 Past Medical History:  Diagnosis Date   Arthritis    Asthma    Bronchitis    CHF (congestive heart failure) (HCC)    Diabetes (HCC) 2019   Diverticulitis    DVT, lower extremity, recurrent (HCC)    Patient had unprovoked PE on 2002 and DVT in right lower extremety 2008.   Frequent urination 03/21/2019   GERD (gastroesophageal reflux disease)    Hematuria, microscopic 09/13/2017   History of kidney stones    Hypertension    Lower abdominal pain 02/20/2018   PE (pulmonary embolism)    Patient had unprovoked PE on 2002   PONV (postoperative nausea and vomiting)    Vertigo    Social History   Socioeconomic History   Marital status: Divorced    Spouse name: Not on file   Number of children: 5   Years of education: 9 th grade   Highest education level: 9th grade  Occupational History   Occupation: retired  Tobacco Use    Smoking status: Former    Types: Cigarettes    Quit date: 09/18/1988    Years since quitting: 32.8   Smokeless tobacco: Never   Tobacco comments:    6 pack year smoking history as a teen  Vaping Use   Vaping Use: Never used  Substance and Sexual Activity   Alcohol use: No   Drug use: No   Sexual activity: Not Currently    Birth control/protection: None  Other Topics Concern   Not on file  Social History Narrative   Current Social History 08/28/2019   Patient lives with 50 yo granddaughter in a one story home. There are not steps up to the entrance the patient uses.       Patient's method of transportation is church  member.      The highest level of education was 9th grade      The patient currently retired.      Identified important Relationships are God, family       Pets : 1 lab/pitt mix named Cinnamon       Interests / Fun: Church,TV       Current Stressors: "Me and my granddaughter"       Religious / Personal Beliefs: I'm Holiness and I love people"       Other: "I'd give away my last dime."       L. Ducatte, BSN, RN-BC    Social Determinants of Health   Financial Resource Strain: Medium Risk (05/17/2017)   Overall Financial Resource Strain (CARDIA)    Difficulty of Paying Living Expenses: Somewhat hard  Food Insecurity: Food Insecurity Present (10/28/2019)   Hunger Vital Sign    Worried About Running Out of Food in the Last Year: Sometimes true    Ran Out of Food in the Last Year: Never true  Transportation Needs: Unmet Transportation Needs (05/17/2017)   PRAPARE - Administrator, Civil Service (Medical): No    Lack of Transportation (Non-Medical): Yes  Physical Activity: Insufficiently Active (05/17/2017)   Exercise Vital Sign    Days of Exercise per Week: 2 days    Minutes of Exercise per Session: 20 min  Stress: Stress Concern Present (05/17/2017)   [employer] of Occupational Health - Occupational Stress Questionnaire    Feeling of Stress : To  some extent  Social Connections: Somewhat Isolated (05/17/2017)   Social Connection and Isolation Panel [NHANES]    Frequency of Communication with Friends and Family: More than three times a week    Frequency of Social Gatherings with Friends and Family: Once a week    Attends Religious Services: More than 4 times per year    Active Member of [employer] or Organizations: No    Attends Banker Meetings: Never    Marital Status: Divorced   Family History  Problem Relation Age of Onset   Cancer Mother    Hypertension Sister    Diabetes Sister    Hypertension Brother    Diabetes Brother    Hypertension Sister    Diabetes Sister    Colon cancer Other    Heart Problems Other 34       sister's child, open heart surgery   CVA Father    Diabetes Son    Hypertension Son    Kidney disease Son        on dialysis   Hypertension Sister    Diabetes Sister    Hypertension Sister    Diabetes Sister    Cancer Brother    Hypertension Son    Diabetes Son    Hypertension Daughter    Schizophrenia Daughter     ASSESSMENT Recent Results: The most recent result is correlated with 20 mg per week: Lab Results  Component Value Date   INR 3.7 (A) 07/26/2021   INR 3.9 (H) 07/25/2021   INR 4.0 (H) 07/24/2021    Anticoagulation Dosing: Description   OMIT doses of warfarin on Monday, July 17, Tuesday, July 18, and Wednesday, July 28, 2021. Recommence warfarin on Thursday, July 29, 2021 by taking 1/2 of your 4mg  strength, blue-colored warfarin tablet by mouth, once-daily until seen in the Anticoagulation Management Clinic on Monday, July 24th, 2023 at 9:45 am.      INR today: Supratherapeutic  PLAN  Weekly dose was decreased by OMITTING  doses of warfarin on Monday, July 17, Tuesday, July 18, and Wednesday, July 28, 2021. Recommence warfarin on Thursday, July 29, 2021 by taking 1/2 of your 4mg  strength, blue-colored warfarin tablet by mouth, once-daily until seen in the Anticoagulation  Management Clinic on Monday, July 24th, 2023 at 9:45 am.   Patient Instructions  Patient instructed to take medications as defined in the Anti-coagulation Track section of this encounter.  Patient instructed to OMIT today's dose.  Patient instructed to OMIT doses of warfarin on Monday, July 17, Tuesday, July 18, and Wednesday, July 28, 2021. Recommence warfarin on Thursday, July 29, 2021 by taking 1/2 of your 4mg  strength, blue-colored warfarin tablet by mouth, once-daily until seen in the Anticoagulation Management Clinic on Monday, July 24th, 2023 at 9:45 am.  Patient verbalized understanding of these instructions.   Patient advised to contact clinic or seek medical attention if signs/symptoms of bleeding or thromboembolism occur.  Patient verbalized understanding by repeating back information and was advised to contact me if further medication-related questions arise. Patient was also provided an information handout.  Follow-up Return in 1 week (on 08/02/2021) for Follow up INR.  Pennie Banter, PharmD, CPP  15 minutes spent face-to-face with the patient during the encounter. 50% of time spent on education, including signs/sx bleeding and clotting, as well as food and drug interactions with warfarin. 50% of time was spent on fingerprick POC INR sample collection,processing, results determination, and documentation in [email or web address]

## 2021-07-28 DIAGNOSIS — N390 Urinary tract infection, site not specified: Secondary | ICD-10-CM | POA: Diagnosis not present

## 2021-07-28 DIAGNOSIS — N179 Acute kidney failure, unspecified: Secondary | ICD-10-CM | POA: Diagnosis not present

## 2021-07-28 DIAGNOSIS — D509 Iron deficiency anemia, unspecified: Secondary | ICD-10-CM | POA: Diagnosis not present

## 2021-07-28 DIAGNOSIS — I110 Hypertensive heart disease with heart failure: Secondary | ICD-10-CM | POA: Diagnosis not present

## 2021-07-28 DIAGNOSIS — I5042 Chronic combined systolic (congestive) and diastolic (congestive) heart failure: Secondary | ICD-10-CM | POA: Diagnosis not present

## 2021-07-29 ENCOUNTER — Telehealth: Payer: Self-pay | Admitting: *Deleted

## 2021-07-29 DIAGNOSIS — N390 Urinary tract infection, site not specified: Secondary | ICD-10-CM | POA: Diagnosis not present

## 2021-07-29 DIAGNOSIS — D509 Iron deficiency anemia, unspecified: Secondary | ICD-10-CM | POA: Diagnosis not present

## 2021-07-29 DIAGNOSIS — I5042 Chronic combined systolic (congestive) and diastolic (congestive) heart failure: Secondary | ICD-10-CM | POA: Diagnosis not present

## 2021-07-29 DIAGNOSIS — N179 Acute kidney failure, unspecified: Secondary | ICD-10-CM | POA: Diagnosis not present

## 2021-07-29 DIAGNOSIS — I110 Hypertensive heart disease with heart failure: Secondary | ICD-10-CM | POA: Diagnosis not present

## 2021-07-29 NOTE — Telephone Encounter (Signed)
Call from [place] [facility], with [facility] requesting verbal orders; stated pt was recently d/c from the hospital and eval was done.Stated pt needs assistance with ambulation and transfer d/t weakness.  VO given "PT once a week x 4 weeks": Routed to doctor for agreement or denial.

## 2021-07-29 NOTE — Progress Notes (Signed)
INTERNAL MEDICINE TEACHING ATTENDING ADDENDUM - Millard Bautch M.D  Duration- indefinite, Indication- recurrent VTE, INR- supratherapeutic. Agree with pharmacy recommendations as outlined in their note.     

## 2021-08-02 ENCOUNTER — Telehealth: Payer: Self-pay

## 2021-08-02 ENCOUNTER — Ambulatory Visit (INDEPENDENT_AMBULATORY_CARE_PROVIDER_SITE_OTHER): Payer: Medicare Other | Admitting: Student

## 2021-08-02 ENCOUNTER — Other Ambulatory Visit: Payer: Self-pay | Admitting: Internal Medicine

## 2021-08-02 ENCOUNTER — Other Ambulatory Visit: Payer: Self-pay | Admitting: Pharmacist

## 2021-08-02 ENCOUNTER — Encounter: Payer: Self-pay | Admitting: Student

## 2021-08-02 ENCOUNTER — Ambulatory Visit (INDEPENDENT_AMBULATORY_CARE_PROVIDER_SITE_OTHER): Payer: Medicare Other | Admitting: Pharmacist

## 2021-08-02 VITALS — BP 139/69 | HR 80 | Temp 98.0°F | Ht 62.0 in | Wt 268.5 lb

## 2021-08-02 DIAGNOSIS — Z1211 Encounter for screening for malignant neoplasm of colon: Secondary | ICD-10-CM

## 2021-08-02 DIAGNOSIS — Z87440 Personal history of urinary (tract) infections: Secondary | ICD-10-CM | POA: Diagnosis not present

## 2021-08-02 DIAGNOSIS — I10 Essential (primary) hypertension: Secondary | ICD-10-CM | POA: Diagnosis not present

## 2021-08-02 DIAGNOSIS — I82409 Acute embolism and thrombosis of unspecified deep veins of unspecified lower extremity: Secondary | ICD-10-CM

## 2021-08-02 DIAGNOSIS — Z7901 Long term (current) use of anticoagulants: Secondary | ICD-10-CM

## 2021-08-02 DIAGNOSIS — R195 Other fecal abnormalities: Secondary | ICD-10-CM | POA: Diagnosis not present

## 2021-08-02 DIAGNOSIS — I82402 Acute embolism and thrombosis of unspecified deep veins of left lower extremity: Secondary | ICD-10-CM

## 2021-08-02 DIAGNOSIS — Z87891 Personal history of nicotine dependence: Secondary | ICD-10-CM

## 2021-08-02 DIAGNOSIS — R112 Nausea with vomiting, unspecified: Secondary | ICD-10-CM

## 2021-08-02 LAB — POCT INR: INR: 2.1 (ref 2.0–3.0)

## 2021-08-02 MED ORDER — LOSARTAN POTASSIUM 25 MG PO TABS
12.5000 mg | ORAL_TABLET | Freq: Every day | ORAL | 9 refills | Status: DC
Start: 2021-08-02 — End: 2021-08-03

## 2021-08-02 MED ORDER — WARFARIN SODIUM 4 MG PO TABS: ORAL_TABLET | ORAL | 2 refills | Status: DC

## 2021-08-02 MED ORDER — CARVEDILOL 12.5 MG PO TABS: 12.5000 mg | ORAL_TABLET | Freq: Two times a day (BID) | ORAL | 5 refills | Status: DC

## 2021-08-02 MED ORDER — ONDANSETRON HCL 4 MG PO TABS: 4.0000 mg | ORAL_TABLET | Freq: Every day | ORAL | 0 refills | Status: DC | PRN

## 2021-08-02 NOTE — Telephone Encounter (Signed)
[facility] is requesting a call back .[name] For verbal orders for patient    Caroline [name] [phone]

## 2021-08-02 NOTE — Progress Notes (Signed)
Anticoagulation Management Caroline [name] is a 76 y.o. female who reports to the clinic for monitoring of warfarin treatment.    Indication: DVT , history of; long term current use of oral anticoagulant, warfarin. Target INR 2.0 - 3.0.  Duration: indefinite Supervising physician:  Mercie Eon, MD  Anticoagulation Clinic Visit History: Patient does not report signs/symptoms of bleeding or thromboembolism  Other recent changes: No diet, medications, lifestyle changes.  Anticoagulation Episode Summary     Current INR goal:  2.0-3.0  TTR:  83.7 % (6.5 y)  Next INR check:  08/30/2021  INR from last check:  2.1 (08/02/2021)  Weekly max warfarin dose:    Target end date:    INR check location:  Anticoagulation Clinic  Preferred lab:    Send INR reminders to:  [facility]   Indications   DVT lower extremity recurrent (HCC) [I82.409] PULMONARY EMBOLISM HX OF (Resolved) [Z86.718]        Comments:           Allergies  Allergen Reactions   Penicillins Anaphylaxis, Hives, Swelling and Rash    Has patient had a PCN reaction causing immediate rash, facial/tongue/throat swelling, SOB or lightheadedness with hypotension: Yes Has patient had a PCN reaction causing severe rash involving mucus membranes or skin necrosis: Yes Has patient had a PCN reaction that required hospitalization Yes Has patient had a PCN reaction occurring within the last 10 years: No If all of the above answers are "NO", then may proceed with Cephalosporin use.    Cabbage Itching   Keflex [Cephalexin] Hives    And feeling of throat tightness   Other Itching    Lettuce - itching   Shellfish Allergy Swelling   Tomato Swelling   Latex Itching and Rash    Current Outpatient Medications:    carvedilol (COREG) 12.5 MG tablet, Take 1 tablet (12.5 mg total) by mouth 2 (two) times daily with a meal., Disp: 60 tablet, Rfl: 0   cholecalciferol (VITAMIN D) 25 MCG (1000 UNIT) tablet, TAKE 1 TABLET BY MOUTH EVERY DAY  (Patient taking differently: Take 1,000 Units by mouth daily.), Disp: 90 tablet, Rfl: 1   fluticasone (FLONASE) 50 MCG/ACT nasal spray, PLACE 2 SPRAYS DAILY INTO BOTH NOSTRILS. (Patient taking differently: Place 2 sprays into both nostrils daily as needed for allergies.), Disp: 48 mL, Rfl: 1   hydrocortisone (PROCTOSOL HC) 2.5 % rectal cream, Place 1 application rectally 2 (two) times daily. (Patient taking differently: Place 1 application  rectally 2 (two) times daily as needed for hemorrhoids.), Disp: 30 g, Rfl: 0   losartan-hydrochlorothiazide (HYZAAR) 100-25 MG tablet, Take 1 tablet by mouth daily., Disp: 30 tablet, Rfl: 0   meclizine (ANTIVERT) 25 MG tablet, TAKE 1 TABLET BY MOUTH THREE TIMES A DAY AS NEEDED (Patient taking differently: Take 25 mg by mouth 3 (three) times daily as needed for dizziness.), Disp: 90 tablet, Rfl: 5   metFORMIN (GLUCOPHAGE-XR) 500 MG 24 hr tablet, Take 1 tablet (500 mg total) by mouth 2 (two) times daily with a meal., Disp: 180 tablet, Rfl: 1   methocarbamol (ROBAXIN) 500 MG tablet, Take 1 tablet (500 mg total) by mouth 2 (two) times daily as needed for up to 10 doses for muscle spasms., Disp: 10 tablet, Rfl: 0   montelukast (SINGULAIR) 10 MG tablet, TAKE 1 TABLET BY MOUTH EVERYDAY AT BEDTIME (Patient taking differently: Take 10 mg by mouth at bedtime.), Disp: 90 tablet, Rfl: 3   pantoprazole (PROTONIX) 40 MG tablet, TAKE 1  TABLET BY MOUTH EVERY DAY (Patient taking differently: Take 40 mg by mouth daily.), Disp: 90 tablet, Rfl: 1   PROAIR HFA 108 (90 Base) MCG/ACT inhaler, TAKE 2 PUFFS BY MOUTH EVERY 6 HOURS AS NEEDED FOR WHEEZE OR SHORTNESS OF BREATH (Patient taking differently: Inhale 2 puffs into the lungs every 6 (six) hours as needed for wheezing or shortness of breath.), Disp: 8.5 Inhaler, Rfl: 2   Psyllium (METAMUCIL PO), Take by mouth at bedtime. gummies, Disp: , Rfl:    rosuvastatin (CRESTOR) 20 MG tablet, TAKE 1 TABLET BY MOUTH EVERY DAY (Patient taking  differently: Take 20 mg by mouth daily.), Disp: 90 tablet, Rfl: 3   SYMBICORT 160-4.5 MCG/ACT inhaler, TAKE 2 PUFFS BY MOUTH TWICE A DAY (Patient taking differently: Inhale 2 puffs into the lungs 2 (two) times daily as needed (wheezing, shortness of breath).), Disp: 30.6 each, Rfl: 4   warfarin (COUMADIN) 4 MG tablet, Take 4 mg by mouth daily. Take one (1) tablet on Mondays, Wednesdays and Fridays. Take only one-half (1/2) tablet on Sundays, Tuesdays, Thursdays and Saturdays., Disp: , Rfl:    acetaminophen (TYLENOL) 500 MG tablet, Take 500 mg by mouth 4 (four) times daily as needed for mild pain or headache. (Patient not taking: Reported on 08/02/2021), Disp: , Rfl:    diclofenac Sodium (VOLTAREN) 1 % GEL, Apply 4 g topically 4 (four) times daily. (Patient not taking: Reported on 08/02/2021), Disp: 4 g, Rfl: 0   RESTASIS 0.05 % ophthalmic emulsion, INSTILL 1 DROP INTO BOTH EYES TWICE A DAY (Patient not taking: Reported on 07/26/2021), Disp: 60 mL, Rfl: 1   tirzepatide (MOUNJARO) 2.5 MG/0.5ML Pen, Inject 2.5 mg into the skin once a week. (Patient not taking: Reported on 08/02/2021), Disp: 2 mL, Rfl: 0 Past Medical History:  Diagnosis Date   Arthritis    Asthma    Bronchitis    CHF (congestive heart failure) ([place])    Diabetes ([place]) 2019   Diverticulitis    DVT, lower extremity, recurrent ([place])    Patient had unprovoked PE on 2002 and DVT in right lower extremety 2008.   Frequent urination 03/21/2019   GERD (gastroesophageal reflux disease)    Hematuria, microscopic 09/13/2017   History of kidney stones    Hypertension    Lower abdominal pain 02/20/2018   PE (pulmonary embolism)    Patient had unprovoked PE on 2002   PONV (postoperative nausea and vomiting)    Vertigo    Social History   Socioeconomic History   Marital status: Divorced    Spouse name: Not on file   Number of children: 5   Years of education: 9 th grade   Highest education level: 9th grade  Occupational History   Occupation:  retired  Tobacco Use   Smoking status: Former    Types: Cigarettes    Quit date: 09/18/1988    Years since quitting: 32.8   Smokeless tobacco: Never   Tobacco comments:    6 pack year smoking history as a teen  Vaping Use   Vaping Use: Never used  Substance and Sexual Activity   Alcohol use: No   Drug use: No   Sexual activity: Not Currently    Birth control/protection: None  Other Topics Concern   Not on file  Social History Narrative   Current Social History 08/28/2019   Patient lives with 79 yo granddaughter in a one story home. There are not steps up to the entrance the patient uses.  Patient's method of transportation is church member.      The highest level of education was 9th grade      The patient currently retired.      Identified important Relationships are God, family       Pets : 1 lab/pitt mix named Cinnamon       Interests / Fun: Church,TV       Current Stressors: "Me and my granddaughter"       Religious / Personal Beliefs: I'm Holiness and I love people"       Other: "I'd give away my last dime."       L. Ducatte, BSN, RN-BC    Social Determinants of Health   Financial Resource Strain: Medium Risk (05/17/2017)   Overall Financial Resource Strain (CARDIA)    Difficulty of Paying Living Expenses: Somewhat hard  Food Insecurity: Food Insecurity Present (10/28/2019)   Hunger Vital Sign    Worried About Running Out of Food in the Last Year: Sometimes true    Ran Out of Food in the Last Year: Never true  Transportation Needs: Unmet Transportation Needs (05/17/2017)   PRAPARE - Administrator, Civil Service (Medical): No    Lack of Transportation (Non-Medical): Yes  Physical Activity: Insufficiently Active (05/17/2017)   Exercise Vital Sign    Days of Exercise per Week: 2 days    Minutes of Exercise per Session: 20 min  Stress: Stress Concern Present (05/17/2017)   [employer] of Occupational Health - Occupational Stress Questionnaire     Feeling of Stress : To some extent  Social Connections: Somewhat Isolated (05/17/2017)   Social Connection and Isolation Panel [NHANES]    Frequency of Communication with Friends and Family: More than three times a week    Frequency of Social Gatherings with Friends and Family: Once a week    Attends Religious Services: More than 4 times per year    Active Member of [employer] or Organizations: No    Attends Banker Meetings: Never    Marital Status: Divorced   Family History  Problem Relation Age of Onset   Cancer Mother    Hypertension Sister    Diabetes Sister    Hypertension Brother    Diabetes Brother    Hypertension Sister    Diabetes Sister    Colon cancer Other    Heart Problems Other 34       sister's child, open heart surgery   CVA Father    Diabetes Son    Hypertension Son    Kidney disease Son        on dialysis   Hypertension Sister    Diabetes Sister    Hypertension Sister    Diabetes Sister    Cancer Brother    Hypertension Son    Diabetes Son    Hypertension Daughter    Schizophrenia Daughter     ASSESSMENT Recent Results: The most recent result is correlated with 18 mg per week:    Anticoagulation Dosing: Description   Take one (1) tablet of your 4mg  blue-colored warfarin tablets on Mondays, Wednesdays and Fridays. On Sundays, Tuesdays, Thursdays and Saturdays, take only one-half (1/2) tablet.      INR today: Therapeutic  PLAN Weekly dose was increased by 11% to 20 mg per week  Patient Instructions  Patient instructed to take medications as defined in the Anti-coagulation Track section of this encounter.  Patient instructed to take today's dose.  Patient instructed to take one (  1) tablet of your 4mg  blue-colored warfarin tablets on Mondays, Wednesdays and Fridays. On Sundays, Tuesdays, Thursdays and Saturdays, take only one-half (1/2) tablet Patient verbalized understanding of these instructions.   Patient advised to contact clinic  or seek medical attention if signs/symptoms of bleeding or thromboembolism occur.  Patient verbalized understanding by repeating back information and was advised to contact me if further medication-related questions arise. Patient was also provided an information handout.  Follow-up Return in 4 weeks (on 08/30/2021) for Follow up INR.  Pennie Banter, PharmD, CPP  15 minutes spent face-to-face with the patient during the encounter. 50% of time spent on education, including signs/sx bleeding and clotting, as well as food and drug interactions with warfarin. 50% of time was spent on fingerprick POC INR sample collection,processing, results determination, and documentation in [email or web address]/.

## 2021-08-02 NOTE — Telephone Encounter (Signed)
Needs orders for [facility] Occupational Therapy.  1 time a week for 3 weeks to concentrate on ADL, IADLs  erercise, therapeutic activities, dynamic standing for ADLs.and  Adaptive Equipment as needed.  Completed assessment on patient today.

## 2021-08-02 NOTE — Assessment & Plan Note (Signed)
Patient went to INR clinic this morning, her INR was 2.1.  She was taken off of her warfarin during her hospital course, but restarted on her warfarin on July 20.

## 2021-08-02 NOTE — Progress Notes (Signed)
CC: Hospital F/U  HPI:  Caroline [name] is a 76 y.o. female living with a history stated below and presents today for a follow up for her hospital stay. Please see problem based assessment and plan for additional details.  Past Medical History:  Diagnosis Date   Arthritis    Asthma    Bronchitis    CHF (congestive heart failure) (HCC)    Diabetes (HCC) 2019   Diverticulitis    DVT, lower extremity, recurrent (HCC)    Patient had unprovoked PE on 2002 and DVT in right lower extremety 2008.   Frequent urination 03/21/2019   GERD (gastroesophageal reflux disease)    Hematuria, microscopic 09/13/2017   History of kidney stones    Hypertension    Lower abdominal pain 02/20/2018   PE (pulmonary embolism)    Patient had unprovoked PE on 2002   PONV (postoperative nausea and vomiting)    Vertigo     Current Outpatient Medications on File Prior to Visit  Medication Sig Dispense Refill   acetaminophen (TYLENOL) 500 MG tablet Take 500 mg by mouth 4 (four) times daily as needed for mild pain or headache. (Patient not taking: Reported on 08/02/2021)     cholecalciferol (VITAMIN D) 25 MCG (1000 UNIT) tablet TAKE 1 TABLET BY MOUTH EVERY DAY (Patient taking differently: Take 1,000 Units by mouth daily.) 90 tablet 1   diclofenac Sodium (VOLTAREN) 1 % GEL Apply 4 g topically 4 (four) times daily. (Patient not taking: Reported on 08/02/2021) 4 g 0   fluticasone (FLONASE) 50 MCG/ACT nasal spray PLACE 2 SPRAYS DAILY INTO BOTH NOSTRILS. (Patient taking differently: Place 2 sprays into both nostrils daily as needed for allergies.) 48 mL 1   hydrocortisone (PROCTOSOL HC) 2.5 % rectal cream Place 1 application rectally 2 (two) times daily. (Patient taking differently: Place 1 application  rectally 2 (two) times daily as needed for hemorrhoids.) 30 g 0   meclizine (ANTIVERT) 25 MG tablet TAKE 1 TABLET BY MOUTH THREE TIMES A DAY AS NEEDED (Patient taking differently: Take 25 mg by mouth 3 (three) times daily  as needed for dizziness.) 90 tablet 5   metFORMIN (GLUCOPHAGE-XR) 500 MG 24 hr tablet Take 1 tablet (500 mg total) by mouth 2 (two) times daily with a meal. 180 tablet 1   methocarbamol (ROBAXIN) 500 MG tablet Take 1 tablet (500 mg total) by mouth 2 (two) times daily as needed for up to 10 doses for muscle spasms. 10 tablet 0   montelukast (SINGULAIR) 10 MG tablet TAKE 1 TABLET BY MOUTH EVERYDAY AT BEDTIME (Patient taking differently: Take 10 mg by mouth at bedtime.) 90 tablet 3   pantoprazole (PROTONIX) 40 MG tablet TAKE 1 TABLET BY MOUTH EVERY DAY (Patient taking differently: Take 40 mg by mouth daily.) 90 tablet 1   PROAIR HFA 108 (90 Base) MCG/ACT inhaler TAKE 2 PUFFS BY MOUTH EVERY 6 HOURS AS NEEDED FOR WHEEZE OR SHORTNESS OF BREATH (Patient taking differently: Inhale 2 puffs into the lungs every 6 (six) hours as needed for wheezing or shortness of breath.) 8.5 Inhaler 2   Psyllium (METAMUCIL PO) Take by mouth at bedtime. gummies     RESTASIS 0.05 % ophthalmic emulsion INSTILL 1 DROP INTO BOTH EYES TWICE A DAY (Patient not taking: Reported on 07/26/2021) 60 mL 1   rosuvastatin (CRESTOR) 20 MG tablet TAKE 1 TABLET BY MOUTH EVERY DAY (Patient taking differently: Take 20 mg by mouth daily.) 90 tablet 3   SYMBICORT 160-4.5 MCG/ACT inhaler  TAKE 2 PUFFS BY MOUTH TWICE A DAY (Patient taking differently: Inhale 2 puffs into the lungs 2 (two) times daily as needed (wheezing, shortness of breath).) 30.6 each 4   tirzepatide (MOUNJARO) 2.5 MG/0.5ML Pen Inject 2.5 mg into the skin once a week. (Patient not taking: Reported on 08/02/2021) 2 mL 0   No current facility-administered medications on file prior to visit.    Family History  Problem Relation Age of Onset   Cancer Mother    Hypertension Sister    Diabetes Sister    Hypertension Brother    Diabetes Brother    Hypertension Sister    Diabetes Sister    Colon cancer Other    Heart Problems Other 34       sister's child, open heart surgery   CVA  Father    Diabetes Son    Hypertension Son    Kidney disease Son        on dialysis   Hypertension Sister    Diabetes Sister    Hypertension Sister    Diabetes Sister    Cancer Brother    Hypertension Son    Diabetes Son    Hypertension Daughter    Schizophrenia Daughter     Social History   Socioeconomic History   Marital status: Divorced    Spouse name: Not on file   Number of children: 5   Years of education: 9 th grade   Highest education level: 9th grade  Occupational History   Occupation: retired  Tobacco Use   Smoking status: Former    Types: Cigarettes    Quit date: 09/18/1988    Years since quitting: 32.8   Smokeless tobacco: Never   Tobacco comments:    6 pack year smoking history as a teen  Vaping Use   Vaping Use: Never used  Substance and Sexual Activity   Alcohol use: No   Drug use: No   Sexual activity: Not Currently    Birth control/protection: None  Other Topics Concern   Not on file  Social History Narrative   Current Social History 08/28/2019   Patient lives with 73 yo granddaughter in a one story home. There are not steps up to the entrance the patient uses.       Patient's method of transportation is church member.      The highest level of education was 9th grade      The patient currently retired.      Identified important Relationships are God, family       Pets : 1 lab/pitt mix named Cinnamon       Interests / Fun: Church,TV       Current Stressors: "Me and my granddaughter"       Religious / Personal Beliefs: I'm Holiness and I love people"       Other: "I'd give away my last dime."       L. [place]atte, BSN, RN-BC    Social Determinants of Health   Financial Resource Strain: Medium Risk (05/17/2017)   Overall Financial Resource Strain (CARDIA)    Difficulty of Paying Living Expenses: Somewhat hard  Food Insecurity: Food Insecurity Present (10/28/2019)   Hunger Vital Sign    Worried About Running Out of Food in the Last Year:  Sometimes true    Ran Out of Food in the Last Year: Never true  Transportation Needs: Unmet Transportation Needs (05/17/2017)   PRAPARE - Administrator, Civil Service (Medical): No  Lack of Transportation (Non-Medical): Yes  Physical Activity: Insufficiently Active (05/17/2017)   Exercise Vital Sign    Days of Exercise per Week: 2 days    Minutes of Exercise per Session: 20 min  Stress: Stress Concern Present (05/17/2017)   [place]    Feeling of Stress : To some extent  Social Connections: Somewhat Isolated (05/17/2017)   Social Connection and Isolation Panel [NHANES]    Frequency of Communication with Friends and Family: More than three times a week    Frequency of Social Gatherings with Friends and Family: Once a week    Attends Religious Services: More than 4 times per year    Active Member of [employer] or Organizations: No    Attends Archivist Meetings: Never    Marital Status: Divorced  Human resources officer Violence: Not on file    Review of Systems: ROS negative except for what is noted on the assessment and plan.  Vitals:   08/02/21 1032  BP: 139/69  Pulse: 80  Temp: 98 F (36.7 C)  TempSrc: Oral  SpO2: 97%  Weight: 268 lb 8 oz (121.8 kg)  Height: 5\' 2"  (1.575 m)    Physical Exam: Constitutional: well-appearing obese woman in wheel chair sitting comfortably, in no acute distress Cardiovascular: regular rate and rhythm, no m/r/g Pulmonary/Chest: normal work of breathing on room air, lungs clear to auscultation bilaterally Abdominal: soft, non-tender, non-distended MSK: normal bulk and tone Neurological: alert & oriented x 3, 5/5 strength in bilateral upper and lower extremities, normal gait Skin: warm and dry   Assessment & Plan:   Essential hypertension Due to patient's recent hospital course, and her orthostatic hypotension during that time, her blood pressure medication was  halted.  She was taking Coreg 12.5 mg, and the combination pill of losartan hydrochlorothiazide 100 mg-12.5.  She has not taken any of her blood pressure medications since July 7, and her blood pressure today is 139/69.  Plan:  - Due to patient's history of heart failure, we will resume antihypertensives with Coreg 12.5 mg and losartan 12.5 mg. - We will see how patient's BMP and blood pressure are at next visit in a month, and potentially adjust medications accordingly.  Positive fecal immunochemical test Pt had a positive FOBT test in 2021, and was referred to GI. However, Patient was hesitant to go through with colonoscopy in 2022.  She claims she has a bad experience with anesthesia.   Plan:  -Ordered another fecal immunochemical test, we will go over results with patient when they arrive.  History of UTI Patient presented to the emergency department with nausea vomiting, diarrhea, weakness.  She was also complaining of passing out while standing.  Urinalysis was done in the emergency department which indicated infection and was started on cefepime at the time but was eventually switched to Bactrim.  This also led to an AKI due to the dehydration.  Plan:  - Patient is here for a follow-up for a hospital stay (please see discharge note), and she reports no urinary symptoms and completed her antibiotic course of Bactrim.  DVT, lower extremity, recurrent ([place]) Patient went to INR clinic this morning, her INR was 2.1.  She was taken off of her warfarin during her hospital course, but restarted on her warfarin on July 20.  Patient seen with Dr. Tobi Bastos Cindel Daugherty, M.D. [facility], PGY-1 Phone: [phone] Date 08/02/2021 Time 5:07 PM

## 2021-08-02 NOTE — Assessment & Plan Note (Addendum)
Due to patient's recent hospital course, and her orthostatic hypotension during that time, her blood pressure medication was halted.  She was taking Coreg 12.5 mg, and the combination pill of losartan hydrochlorothiazide 100 mg-12.5.  She has not taken any of her blood pressure medications since July 7, and her blood pressure today is 139/69.  Plan:  -Obtain BMP today - Due to patient's history of heart failure, we will resume antihypertensives with Coreg 12.5 mg and losartan 12.5 mg. - We will see how patient's BMP and blood pressure are at next visit in a month, and potentially adjust medications accordingly.

## 2021-08-02 NOTE — Patient Instructions (Signed)
Patient instructed to take medications as defined in the Anti-coagulation Track section of this encounter.  Patient instructed to take today's dose.  Patient instructed to take one (1) tablet of your 4mg  blue-colored warfarin tablets on Mondays, Wednesdays and Fridays. On Sundays, Tuesdays, Thursdays and Saturdays, take only one-half (1/2) tablet Patient verbalized understanding of these instructions.

## 2021-08-02 NOTE — Telephone Encounter (Signed)
Patient requests refill on her wararin. Will send electronically to her pharmacy.

## 2021-08-02 NOTE — Patient Instructions (Addendum)
Thank you so much for coming to the clinic today!  It was a pleasure meeting you!  We talked about a few things today is a quick summary.  We are going to restart your blood pressure medication, please do not take the losartan that you already have, and take the losartan that I am giving you now.  The carvedilol that you have is fine to take, I am also giving you extras.  We are getting some lab work done today as well, and I will call you with results.  I will also order a stool test for you for you to complete at your earliest convenience.  We want to see you back in one month!  If you have any questions please call the clinic at [phone].  Best, Dr. Thomasene Ripple

## 2021-08-02 NOTE — Assessment & Plan Note (Addendum)
Pt had a positive FOBT test in 2021, and was referred to GI. However, Patient was hesitant to go through with colonoscopy in 2022.  She claims she has a bad experience with anesthesia.   Plan:  -Ordered another fecal immunochemical test, we will go over results with patient when they arrive.

## 2021-08-02 NOTE — Assessment & Plan Note (Addendum)
Patient presented to the emergency department with nausea vomiting, diarrhea, weakness.  She was also complaining of passing out while standing.  Urinalysis was done in the emergency department which indicated infection and was started on cefepime at the time but was eventually switched to Bactrim.  This also led to an AKI due to the dehydration.  Plan:  - Patient is here for a follow-up for a hospital stay (please see discharge note), and she reports no urinary symptoms and completed her antibiotic course of Bactrim.

## 2021-08-03 LAB — BMP8+ANION GAP
Anion Gap: 14 mmol/L (ref 10.0–18.0)
BUN/Creatinine Ratio: 16 (ref 12–28)
BUN: 12 mg/dL (ref 8–27)
CO2: 25 mmol/L (ref 20–29)
Calcium: 10.2 mg/dL (ref 8.7–10.3)
Chloride: 103 mmol/L (ref 96–106)
Creatinine, Ser: 0.75 mg/dL (ref 0.57–1.00)
Glucose: 110 mg/dL — ABNORMAL HIGH (ref 70–99)
Potassium: 4 mmol/L (ref 3.5–5.2)
Sodium: 142 mmol/L (ref 134–144)
eGFR: 83 mL/min/{1.73_m2} (ref >59)

## 2021-08-03 MED ORDER — LOSARTAN POTASSIUM 25 MG PO TABS: 12.5000 mg | ORAL_TABLET | Freq: Every day | ORAL | 5 refills | Status: DC

## 2021-08-03 MED ORDER — ONDANSETRON HCL 4 MG PO TABS: 4.0000 mg | ORAL_TABLET | Freq: Every day | ORAL | 0 refills | Status: DC | PRN

## 2021-08-03 MED ORDER — ONDANSETRON HCL 4 MG PO TABS: 4.0000 mg | ORAL_TABLET | Freq: Every day | ORAL | 1 refills | Status: DC | PRN

## 2021-08-03 MED ORDER — CARVEDILOL 12.5 MG PO TABS: 12.5000 mg | ORAL_TABLET | Freq: Two times a day (BID) | ORAL | 5 refills | Status: DC

## 2021-08-03 NOTE — Progress Notes (Signed)
Internal Medicine Clinic Attending  I saw and evaluated the patient.  I personally confirmed the key portions of the history and exam documented by Dr. Thomasene Ripple and I reviewed pertinent patient test results.  The assessment, diagnosis, and plan were formulated together and I agree with the documentation in the resident's note.   Patient here for hospital follow-up after admission with UTI and AKI. She has completed her antibiotics, her UTI symptoms have fully resolved, and labs show that AKI has also fully resolved.   For her recurrent DVTs, she is stable on Warfarin, and saw our Warfarin clinic today.  For her HFrEF, I'd like to try her back on some GDMT (was appropriately held while BP's were low in the hospital). Will stop HCTZ. Restart Losartan at lower dose of 12.5mg  daily and Carvedilol 12.5mg  BID.   She should get a colonoscopy, but is very nervous about this, so will start with another FOBT

## 2021-08-03 NOTE — Addendum Note (Signed)
Addended byOlegario Messier on: 08/03/2021 04:39 PM   Modules accepted: Orders

## 2021-08-03 NOTE — Addendum Note (Signed)
Addended by: Belva Agee on: 08/03/2021 04:36 PM   Modules accepted: Orders

## 2021-08-04 ENCOUNTER — Telehealth: Payer: Self-pay | Admitting: Student

## 2021-08-04 NOTE — Telephone Encounter (Signed)
Received call from Visharia with [facility] requesting VO for [facility] OT 1 week 3 to work on activities stated below. Verbal auth given. PCP has already approved order.

## 2021-08-04 NOTE — Telephone Encounter (Signed)
Discussed lab results with patient on the phone. Kidney function has normalized since hospital admission.

## 2021-08-05 ENCOUNTER — Ambulatory Visit: Payer: Medicare Other

## 2021-08-05 DIAGNOSIS — I110 Hypertensive heart disease with heart failure: Secondary | ICD-10-CM | POA: Diagnosis not present

## 2021-08-05 DIAGNOSIS — I5042 Chronic combined systolic (congestive) and diastolic (congestive) heart failure: Secondary | ICD-10-CM | POA: Diagnosis not present

## 2021-08-05 DIAGNOSIS — D509 Iron deficiency anemia, unspecified: Secondary | ICD-10-CM | POA: Diagnosis not present

## 2021-08-05 DIAGNOSIS — N390 Urinary tract infection, site not specified: Secondary | ICD-10-CM | POA: Diagnosis not present

## 2021-08-05 DIAGNOSIS — N179 Acute kidney failure, unspecified: Secondary | ICD-10-CM | POA: Diagnosis not present

## 2021-08-10 DIAGNOSIS — I5042 Chronic combined systolic (congestive) and diastolic (congestive) heart failure: Secondary | ICD-10-CM | POA: Diagnosis not present

## 2021-08-10 DIAGNOSIS — N390 Urinary tract infection, site not specified: Secondary | ICD-10-CM | POA: Diagnosis not present

## 2021-08-10 DIAGNOSIS — N179 Acute kidney failure, unspecified: Secondary | ICD-10-CM | POA: Diagnosis not present

## 2021-08-10 DIAGNOSIS — I110 Hypertensive heart disease with heart failure: Secondary | ICD-10-CM | POA: Diagnosis not present

## 2021-08-10 DIAGNOSIS — D509 Iron deficiency anemia, unspecified: Secondary | ICD-10-CM | POA: Diagnosis not present

## 2021-08-13 ENCOUNTER — Encounter: Payer: Self-pay | Admitting: Cardiology

## 2021-08-13 ENCOUNTER — Ambulatory Visit (INDEPENDENT_AMBULATORY_CARE_PROVIDER_SITE_OTHER): Payer: Medicare Other | Admitting: Cardiology

## 2021-08-13 VITALS — BP 134/72 | HR 74 | Ht 62.0 in | Wt 269.4 lb

## 2021-08-13 DIAGNOSIS — I5022 Chronic systolic (congestive) heart failure: Secondary | ICD-10-CM | POA: Diagnosis not present

## 2021-08-13 NOTE — Patient Instructions (Signed)
Medication Instructions:  Your physician recommends that you continue on your current medications as directed. Please refer to the Current Medication list given to you today.  *If you need a refill on your cardiac medications before your next appointment, please call your pharmacy*   Lab Work: None ordered If you have labs (blood work) drawn today and your tests are completely normal, you will receive your results only by: MyChart Message (if you have MyChart) OR A paper copy in the mail If you have any lab test that is abnormal or we need to change your treatment, we will call you to review the results.   Testing/Procedures: None ordered   Follow-Up: At [facility], you and your health needs are our priority.  As part of our continuing mission to provide you with exceptional heart care, we have created designated Provider Care Teams.  These Care Teams include your primary Cardiologist (physician) and Advanced Practice Providers (APPs -  Physician Assistants and Nurse Practitioners) who all work together to provide you with the care you need, when you need it.  We recommend signing up for the patient portal called "MyChart".  Sign up information is provided on this After Visit Summary.  MyChart is used to connect with patients for Virtual Visits (Telemedicine).  Patients are able to view lab/test results, encounter notes, upcoming appointments, etc.  Non-urgent messages can be sent to your provider as well.   To learn more about what you can do with MyChart, go to [email or web address].    Your next appointment:   1 year(s)  The format for your next appointment:   In Person  Provider:   General cardiology PA     Thank you for choosing [facility]!!   Dory Horn, RN [phone]  Other Instructions    Important Information About Sugar

## 2021-08-13 NOTE — Progress Notes (Signed)
Electrophysiology Office Note   Date:  08/13/2021   ID:  Caroline [name], DOB 08-04-1945, MRN [id number]  PCP:  Earl Lagos, MD  Cardiologist:  Regan Lemming, MD    No chief complaint on file.     History of Present Illness: Caroline [name] is a 76 y.o. female who presents today for electrophysiology evaluation.     She has a history seen for pulmonary embolism, hypertension, systolic heart failure.  She presented to the hospital with a UTI.  Per the notes, she did not have any evidence of volume overload.  Her Coumadin was held and she developed an SVT.  She is now back on Coumadin, followed in her primary physician's office.    Today, denies symptoms of palpitations, chest pain, shortness of breath, orthopnea, PND, lower extremity edema, claudication, dizziness, presyncope, syncope, bleeding, or neurologic sequela. The patient is tolerating medications without difficulties.    Past Medical History:  Diagnosis Date   Arthritis    Asthma    Bronchitis    CHF (congestive heart failure) (HCC)    Diabetes (HCC) 2019   Diverticulitis    DVT, lower extremity, recurrent (HCC)    Patient had unprovoked PE on 2002 and DVT in right lower extremety 2008.   Frequent urination 03/21/2019   GERD (gastroesophageal reflux disease)    Hematuria, microscopic 09/13/2017   History of kidney stones    Hypertension    Lower abdominal pain 02/20/2018   PE (pulmonary embolism)    Patient had unprovoked PE on 2002   PONV (postoperative nausea and vomiting)    Vertigo    Past Surgical History:  Procedure Laterality Date   ABDOMINAL HYSTERECTOMY     1980   CYSTOSCOPY W/ URETERAL STENT PLACEMENT Right 09/16/2015   Procedure: CYSTOSCOPY WITH RETROGRADE PYELOGRAM/URETERAL STENT PLACEMENT;  Surgeon: Alfredo Martinez, MD;  Location: [facility] OR;  Service: Urology;  Laterality: Right;   CYSTOSCOPY WITH RETROGRADE PYELOGRAM, URETEROSCOPY AND STENT PLACEMENT Right 12/11/2015   Procedure: RIGHT  URETEROSCOPY/HOLMIUM LASER LITHOTRIPSY AND STONE REMOVAL removal and placement of double j stent;  Surgeon: Crist Fat, MD;  Location: [facility];  Service: Urology;  Laterality: Right;   HOLMIUM LASER APPLICATION Right 12/11/2015   Procedure: HOLMIUM LASER APPLICATION;  Surgeon: Crist Fat, MD;  Location: [facility];  Service: Urology;  Laterality: Right;     Current Outpatient Medications  Medication Sig Dispense Refill   acetaminophen (TYLENOL) 500 MG tablet Take 500 mg by mouth 4 (four) times daily as needed for mild pain or headache.     carvedilol (COREG) 12.5 MG tablet Take 1 tablet (12.5 mg total) by mouth 2 (two) times daily with a meal. 60 tablet 5   cholecalciferol (VITAMIN D) 25 MCG (1000 UNIT) tablet TAKE 1 TABLET BY MOUTH EVERY DAY 90 tablet 1   diclofenac Sodium (VOLTAREN) 1 % GEL Apply 4 g topically 4 (four) times daily. 4 g 0   fluticasone (FLONASE) 50 MCG/ACT nasal spray PLACE 2 SPRAYS DAILY INTO BOTH NOSTRILS. 48 mL 1   hydrocortisone (PROCTOSOL HC) 2.5 % rectal cream Place 1 application rectally 2 (two) times daily. (Patient taking differently: Place 1 application  rectally 2 (two) times daily as needed for hemorrhoids.) 30 g 0   losartan (COZAAR) 25 MG tablet Take 0.5 tablets (12.5 mg total) by mouth daily. 30 tablet 5   meclizine (ANTIVERT) 25 MG tablet TAKE 1 TABLET BY MOUTH THREE TIMES A DAY AS NEEDED 90 tablet 5  metFORMIN (GLUCOPHAGE-XR) 500 MG 24 hr tablet Take 1 tablet (500 mg total) by mouth 2 (two) times daily with a meal. 180 tablet 1   methocarbamol (ROBAXIN) 500 MG tablet Take 1 tablet (500 mg total) by mouth 2 (two) times daily as needed for up to 10 doses for muscle spasms. 10 tablet 0   montelukast (SINGULAIR) 10 MG tablet TAKE 1 TABLET BY MOUTH EVERYDAY AT BEDTIME 90 tablet 3   ondansetron (ZOFRAN) 4 MG tablet Take 1 tablet (4 mg total) by mouth daily as needed for nausea or vomiting. 30 tablet 0   pantoprazole (PROTONIX) 40 MG tablet TAKE 1 TABLET BY  MOUTH EVERY DAY 90 tablet 1   PROAIR HFA 108 (90 Base) MCG/ACT inhaler TAKE 2 PUFFS BY MOUTH EVERY 6 HOURS AS NEEDED FOR WHEEZE OR SHORTNESS OF BREATH 8.5 Inhaler 2   Psyllium (METAMUCIL PO) Take by mouth at bedtime. gummies     RESTASIS 0.05 % ophthalmic emulsion INSTILL 1 DROP INTO BOTH EYES TWICE A DAY 60 mL 1   rosuvastatin (CRESTOR) 20 MG tablet TAKE 1 TABLET BY MOUTH EVERY DAY 90 tablet 3   SYMBICORT 160-4.5 MCG/ACT inhaler TAKE 2 PUFFS BY MOUTH TWICE A DAY 30.6 each 4   warfarin (COUMADIN) 4 MG tablet Take one tablet on Mondays, Wednesdays and Fridays. Take one-half tablet on Sundays, Tuesdays, Thursdays and Saturdays. 20 tablet 2   tirzepatide (MOUNJARO) 2.5 MG/0.5ML Pen Inject 2.5 mg into the skin once a week. (Patient not taking: Reported on 08/02/2021) 2 mL 0   No current facility-administered medications for this visit.    Allergies:   Penicillins, Cabbage, Keflex [cephalexin], Other, Shellfish allergy, Tomato, and Latex   Social History:  The patient  reports that she quit smoking about 32 years ago. Her smoking use included cigarettes. She has never used smokeless tobacco. She reports that she does not drink alcohol and does not use drugs.   Family History:  The patient\'s family history includes CVA in her father; Cancer in her brother and mother; Colon cancer in an other family member; Diabetes in her brother, sister, sister, sister, sister, son, and son; Heart Problems (age of onset: 34) in an other family member; Hypertension in her brother, daughter, sister, sister, sister, sister, son, and son; Kidney disease in her son; Schizophrenia in her daughter.   ROS:  Please see the history of present illness.   Otherwise, review of systems is positive for none.   All other systems are reviewed and negative.   PHYSICAL EXAM: VS:  BP 134/72   Pulse 74   Ht 5\' 2" (1.575 m)   Wt 269 lb 6.4 oz (122.2 kg)   SpO2 98%   BMI 49.27 kg/m  , BMI Body mass index is 49.27 kg/m. GEN: Well  nourished, well developed, in no acute distress  HEENT: normal  Neck: no JVD, carotid bruits, or masses Cardiac: RRR; no murmurs, rubs, or gallops,no edema  Respiratory:  clear to auscultation bilaterally, normal work of breathing GI: soft, nontender, nondistended, + BS MS: no deformity or atrophy  Skin: warm and dry Neuro:  Strength and sensation are intact Psych: euthymic mood, full affect  EKG:  EKG is not ordered today. Personal review of the ekg ordered 07/26/21 shows sinus rhythm, right bundle branch block, left anterior fascicular block     10 /18/2017    2:18 PM  PAD Screen  Previous PAD dx? No  Previous surgical procedure? Yes  Dates of procedures 09/16/15  Pain  with walking? No  Feet/toe relief with dangling? No  Painful, non-healing ulcers? No  Extremities discolored? No      Recent Labs: 07/24/2021: ALT 14; Hemoglobin 10.1; Platelets 228; TSH 2.297 07/25/2021: Magnesium 1.6 08/02/2021: BUN 12; Creatinine, Ser 0.75; Potassium 4.0; Sodium 142    Lipid Panel     Component Value Date/Time   CHOL 149 08/17/2020 1106   TRIG 96 08/17/2020 1106   HDL 49 08/17/2020 1106   CHOLHDL 3.0 08/17/2020 1106   CHOLHDL 4.9 04/08/2014 1353   VLDL 44 (H) 04/08/2014 1353   LDLCALC 82 08/17/2020 1106     Wt Readings from Last 3 Encounters:  08/13/21 269 lb 6.4 oz (122.2 kg)  08/02/21 268 lb 8 oz (121.8 kg)  07/23/21 250 lb (113.4 kg)      Other studies Reviewed: Additional studies/ records that were reviewed today include: TTE 2021  Review of the above records today demonstrates:   1. Mild to moderate global reduction in LV systolic function; grade 1  diastolic dysfunction;.   2. Left ventricular ejection fraction, by estimation, is 40 to 45%. The  left ventricle has mild to moderately decreased function. The left  ventricle demonstrates global hypokinesis. There is mild left ventricular  hypertrophy. Left ventricular diastolic  parameters are consistent with Grade I  diastolic dysfunction (impaired  relaxation).   3. Right ventricular systolic function is normal. The right ventricular  size is normal. There is mildly elevated pulmonary artery systolic  pressure.   4. The mitral valve is normal in structure. Trivial mitral valve  regurgitation. No evidence of mitral stenosis.   5. The aortic valve is tricuspid. Aortic valve regurgitation is not  visualized. Mild aortic valve sclerosis is present, with no evidence of  aortic valve stenosis.   6. The inferior vena cava is normal in size with greater than 50%  respiratory variability, suggesting right atrial pressure of 3 mmHg.   30 day monitor 09/06/16 - personally reviewed Sinus rhythm, no arrhythmias noted.  ASSESSMENT AND PLAN:  1.  Chronic combined systolic and diastolic heart failure: Currently on carvedilol 12.5 mg twice daily, losartan 12.5 mg daily.  Her blood pressure is well controlled.  Her blood pressure medications were adjusted due to possible orthostatic hypotension and vertigo.  We Micheline Markes continue with current management.  2.  Hypertension: Currently well controlled  Current medicines are reviewed at length with the patient today.   The patient does not have concerns regarding her medicines.  The following changes were made today: None  Labs/ tests ordered today include:  No orders of the defined types were placed in this encounter.     Disposition:   FU 12 months  Signed, Alishah Schulte Jorja Loa, MD  08/13/2021 11:03 AM     [facility] [address] [place] [address] [phone] (office) [phone] (fax)

## 2021-08-17 DIAGNOSIS — N390 Urinary tract infection, site not specified: Secondary | ICD-10-CM | POA: Diagnosis not present

## 2021-08-17 DIAGNOSIS — N179 Acute kidney failure, unspecified: Secondary | ICD-10-CM | POA: Diagnosis not present

## 2021-08-17 DIAGNOSIS — I110 Hypertensive heart disease with heart failure: Secondary | ICD-10-CM | POA: Diagnosis not present

## 2021-08-17 DIAGNOSIS — D509 Iron deficiency anemia, unspecified: Secondary | ICD-10-CM | POA: Diagnosis not present

## 2021-08-17 DIAGNOSIS — I5042 Chronic combined systolic (congestive) and diastolic (congestive) heart failure: Secondary | ICD-10-CM | POA: Diagnosis not present

## 2021-08-19 DIAGNOSIS — N179 Acute kidney failure, unspecified: Secondary | ICD-10-CM | POA: Diagnosis not present

## 2021-08-19 DIAGNOSIS — N390 Urinary tract infection, site not specified: Secondary | ICD-10-CM | POA: Diagnosis not present

## 2021-08-19 DIAGNOSIS — I110 Hypertensive heart disease with heart failure: Secondary | ICD-10-CM | POA: Diagnosis not present

## 2021-08-19 DIAGNOSIS — I5042 Chronic combined systolic (congestive) and diastolic (congestive) heart failure: Secondary | ICD-10-CM | POA: Diagnosis not present

## 2021-08-19 DIAGNOSIS — D509 Iron deficiency anemia, unspecified: Secondary | ICD-10-CM | POA: Diagnosis not present

## 2021-08-25 ENCOUNTER — Ambulatory Visit
Admission: EM | Admit: 2021-08-25 | Discharge: 2021-08-25 | Disposition: A | Discharge status: Home / Self Care | Payer: Medicare Other

## 2021-08-25 ENCOUNTER — Emergency Department (HOSPITAL_BASED_OUTPATIENT_CLINIC_OR_DEPARTMENT_OTHER): Payer: Medicare Other | Admitting: Radiology

## 2021-08-25 ENCOUNTER — Encounter (HOSPITAL_BASED_OUTPATIENT_CLINIC_OR_DEPARTMENT_OTHER): Payer: Self-pay

## 2021-08-25 ENCOUNTER — Emergency Department (HOSPITAL_BASED_OUTPATIENT_CLINIC_OR_DEPARTMENT_OTHER)
Admission: EM | Admit: 2021-08-25 | Discharge: 2021-08-25 | Disposition: A | Discharge status: Home / Self Care | Payer: Medicare Other | Attending: Emergency Medicine | Admitting: Emergency Medicine

## 2021-08-25 ENCOUNTER — Encounter: Payer: Self-pay | Admitting: Emergency Medicine

## 2021-08-25 ENCOUNTER — Emergency Department (HOSPITAL_BASED_OUTPATIENT_CLINIC_OR_DEPARTMENT_OTHER): Payer: Medicare Other

## 2021-08-25 ENCOUNTER — Ambulatory Visit: Payer: Medicare Other

## 2021-08-25 DIAGNOSIS — Z79899 Other long term (current) drug therapy: Secondary | ICD-10-CM | POA: Diagnosis not present

## 2021-08-25 DIAGNOSIS — Z86718 Personal history of other venous thrombosis and embolism: Secondary | ICD-10-CM | POA: Diagnosis not present

## 2021-08-25 DIAGNOSIS — S8011XA Contusion of right lower leg, initial encounter: Secondary | ICD-10-CM

## 2021-08-25 DIAGNOSIS — Z7901 Long term (current) use of anticoagulants: Secondary | ICD-10-CM | POA: Insufficient documentation

## 2021-08-25 DIAGNOSIS — S82141A Displaced bicondylar fracture of right tibia, initial encounter for closed fracture: Secondary | ICD-10-CM | POA: Diagnosis not present

## 2021-08-25 DIAGNOSIS — T1490XA Injury, unspecified, initial encounter: Secondary | ICD-10-CM | POA: Diagnosis not present

## 2021-08-25 DIAGNOSIS — S8991XA Unspecified injury of right lower leg, initial encounter: Secondary | ICD-10-CM | POA: Diagnosis present

## 2021-08-25 DIAGNOSIS — Z91040 Latex allergy status: Secondary | ICD-10-CM | POA: Insufficient documentation

## 2021-08-25 DIAGNOSIS — M1711 Unilateral primary osteoarthritis, right knee: Secondary | ICD-10-CM | POA: Insufficient documentation

## 2021-08-25 DIAGNOSIS — S82301A Unspecified fracture of lower end of right tibia, initial encounter for closed fracture: Secondary | ICD-10-CM | POA: Diagnosis not present

## 2021-08-25 DIAGNOSIS — W010XXA Fall on same level from slipping, tripping and stumbling without subsequent striking against object, initial encounter: Secondary | ICD-10-CM | POA: Diagnosis not present

## 2021-08-25 DIAGNOSIS — S8001XA Contusion of right knee, initial encounter: Secondary | ICD-10-CM | POA: Insufficient documentation

## 2021-08-25 DIAGNOSIS — W19XXXA Unspecified fall, initial encounter: Secondary | ICD-10-CM

## 2021-08-25 DIAGNOSIS — I4510 Unspecified right bundle-branch block: Secondary | ICD-10-CM | POA: Diagnosis not present

## 2021-08-25 DIAGNOSIS — M79604 Pain in right leg: Secondary | ICD-10-CM | POA: Diagnosis not present

## 2021-08-25 DIAGNOSIS — R791 Abnormal coagulation profile: Secondary | ICD-10-CM

## 2021-08-25 DIAGNOSIS — Z043 Encounter for examination and observation following other accident: Secondary | ICD-10-CM | POA: Diagnosis not present

## 2021-08-25 LAB — COMPREHENSIVE METABOLIC PANEL
ALT: 15 U/L (ref 0–44)
AST: 15 U/L (ref 15–41)
Albumin: 3.8 g/dL (ref 3.5–5.0)
Alkaline Phosphatase: 46 U/L (ref 38–126)
Anion gap: 8 (ref 5–15)
BUN: 11 mg/dL (ref 8–23)
CO2: 30 mmol/L (ref 22–32)
Calcium: 9.7 mg/dL (ref 8.9–10.3)
Chloride: 102 mmol/L (ref 98–111)
Creatinine, Ser: 0.74 mg/dL (ref 0.44–1.00)
GFR, Estimated: >60 mL/min (ref >60)
Glucose, Bld: 94 mg/dL (ref 70–99)
Potassium: 3.5 mmol/L (ref 3.5–5.1)
Sodium: 140 mmol/L (ref 135–145)
Total Bilirubin: 0.5 mg/dL (ref 0.3–1.2)
Total Protein: 7.1 g/dL (ref 6.5–8.1)

## 2021-08-25 LAB — CBC WITH DIFFERENTIAL/PLATELET
Abs Immature Granulocytes: 0.01 10*3/uL (ref 0.00–0.07)
Basophils Absolute: 0 10*3/uL (ref 0.0–0.1)
Basophils Relative: 0 %
Eosinophils Absolute: 0.2 10*3/uL (ref 0.0–0.5)
Eosinophils Relative: 3 %
HCT: 33.3 % — ABNORMAL LOW (ref 36.0–46.0)
Hemoglobin: 10 g/dL — ABNORMAL LOW (ref 12.0–15.0)
Immature Granulocytes: 0 %
Lymphocytes Relative: 33 %
Lymphs Abs: 2.5 10*3/uL (ref 0.7–4.0)
MCH: 23.9 pg — ABNORMAL LOW (ref 26.0–34.0)
MCHC: 30 g/dL (ref 30.0–36.0)
MCV: 79.7 fL — ABNORMAL LOW (ref 80.0–100.0)
Monocytes Absolute: 0.7 10*3/uL (ref 0.1–1.0)
Monocytes Relative: 9 %
Neutro Abs: 4.2 10*3/uL (ref 1.7–7.7)
Neutrophils Relative %: 55 %
Platelets: 253 10*3/uL (ref 150–400)
RBC: 4.18 MIL/uL (ref 3.87–5.11)
RDW: 16.1 % — ABNORMAL HIGH (ref 11.5–15.5)
WBC: 7.6 10*3/uL (ref 4.0–10.5)
nRBC: 0 % (ref 0.0–0.2)

## 2021-08-25 LAB — CBG MONITORING, ED: Glucose-Capillary: 77 mg/dL (ref 70–99)

## 2021-08-25 LAB — PROTIME-INR
INR: 3.1 — ABNORMAL HIGH (ref 0.8–1.2)
Prothrombin Time: 31.5 seconds — ABNORMAL HIGH (ref 11.4–15.2)

## 2021-08-25 LAB — TROPONIN I (HIGH SENSITIVITY): Troponin I (High Sensitivity): 7 ng/L (ref <18)

## 2021-08-25 MED ORDER — MORPHINE SULFATE (PF) 4 MG/ML IV SOLN
4.0000 mg | Freq: Once | INTRAVENOUS | Status: DC
  Filled 2021-08-25: qty 1

## 2021-08-25 MED ORDER — MECLIZINE HCL 25 MG PO TABS
25.0000 mg | ORAL_TABLET | Freq: Once | ORAL | Status: DC
  Filled 2021-08-25: qty 1

## 2021-08-25 MED ORDER — HYDROCODONE-ACETAMINOPHEN 5-325 MG PO TABS: 1.0000 | ORAL_TABLET | Freq: Four times a day (QID) | ORAL | 0 refills | Status: DC | PRN

## 2021-08-25 MED ORDER — LIDOCAINE 5 % EX PTCH
1.0000 | MEDICATED_PATCH | CUTANEOUS | Status: DC
  Administered 2021-08-25: 1 via TRANSDERMAL
  Filled 2021-08-25: qty 1

## 2021-08-25 MED ORDER — DICLOFENAC SODIUM 1 % EX GEL: 4.0000 g | Freq: Four times a day (QID) | CUTANEOUS | 0 refills | Status: DC

## 2021-08-25 NOTE — ED Triage Notes (Signed)
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 She c/o some pain of right hip/knee and possibly low back and lower leg areas. She has ecchymotic area at ant. Right knee. She is awake, alert and in no distress. She is oriented x 4 with clear speech. Her daughter is with her.

## 2021-08-25 NOTE — ED Notes (Signed)
Patient transported to CT via stretcher.

## 2021-08-25 NOTE — ED Provider Notes (Signed)
[facility] EMERGENCY DEPT Provider Note   CSN: [id number] Arrival date & time: 08/25/21  1654     History  Chief Complaint  Patient presents with   Marletta Lor    Caroline [name] is a 76 y.o. female history of DVT on Coumadin, here presenting with fall.  She states that she was getting out of a store and may have tripped and fell and landed on the right knee.  She was not sure if she passed out or not.  Denies any chest pain.  Patient is on Coumadin for DVT.  She was noted to have ecchymosis of the right knee and urgent care was sent in for further evaluation.  The history is provided by the patient.       Home Medications Prior to Admission medications   Medication Sig Start Date End Date Taking? Authorizing Provider  acetaminophen (TYLENOL) 500 MG tablet Take 500 mg by mouth 4 (four) times daily as needed for mild pain or headache.    [provider]  carvedilol (COREG) 12.5 MG tablet Take 1 tablet (12.5 mg total) by mouth 2 (two) times daily with a meal. 08/03/21 01/30/22  Katsadouros, Vasilios, MD  cholecalciferol (VITAMIN D) 25 MCG (1000 UNIT) tablet TAKE 1 TABLET BY MOUTH EVERY DAY 06/21/21   Earl Lagos, MD  diclofenac Sodium (VOLTAREN) 1 % GEL Apply 4 g topically 4 (four) times daily. 07/25/21   Morene Crocker, MD  fluticasone (FLONASE) 50 MCG/ACT nasal spray PLACE 2 SPRAYS DAILY INTO BOTH NOSTRILS. 03/29/21   Earl Lagos, MD  hydrocortisone (PROCTOSOL HC) 2.5 % rectal cream Place 1 application rectally 2 (two) times daily. Patient taking differently: Place 1 application  rectally 2 (two) times daily as needed for hemorrhoids. 03/01/21   Atway, Rayann N, DO  losartan (COZAAR) 25 MG tablet Take 0.5 tablets (12.5 mg total) by mouth daily. 08/03/21 08/03/22  Belva Agee, MD  meclizine (ANTIVERT) 25 MG tablet TAKE 1 TABLET BY MOUTH THREE TIMES A DAY AS NEEDED 06/15/21   Earl Lagos, MD  metFORMIN (GLUCOPHAGE-XR) 500 MG 24 hr tablet Take 1  tablet (500 mg total) by mouth 2 (two) times daily with a meal. 03/01/21   Atway, Rayann N, DO  methocarbamol (ROBAXIN) 500 MG tablet Take 1 tablet (500 mg total) by mouth 2 (two) times daily as needed for up to 10 doses for muscle spasms. 07/25/21   Morene Crocker, MD  montelukast (SINGULAIR) 10 MG tablet TAKE 1 TABLET BY MOUTH EVERYDAY AT BEDTIME 07/06/21   Earl Lagos, MD  ondansetron (ZOFRAN) 4 MG tablet Take 1 tablet (4 mg total) by mouth daily as needed for nausea or vomiting. 08/03/21 08/03/22  Katsadouros, Heidi Dach, MD  pantoprazole (PROTONIX) 40 MG tablet TAKE 1 TABLET BY MOUTH EVERY DAY 05/11/21   Earl Lagos, MD  PROAIR HFA 108 (90 Base) MCG/ACT inhaler TAKE 2 PUFFS BY MOUTH EVERY 6 HOURS AS NEEDED FOR WHEEZE OR SHORTNESS OF BREATH 01/30/18   Earl Lagos, MD  Psyllium (METAMUCIL PO) Take by mouth at bedtime. gummies    [provider]  RESTASIS 0.05 % ophthalmic emulsion INSTILL 1 DROP INTO BOTH EYES TWICE A DAY 08/14/20   Carlynn Purl C, DO  rosuvastatin (CRESTOR) 20 MG tablet TAKE 1 TABLET BY MOUTH EVERY DAY 02/03/21   Earl Lagos, MD  SYMBICORT 160-4.5 MCG/ACT inhaler TAKE 2 PUFFS BY MOUTH TWICE A DAY 10/19/20   Earl Lagos, MD  tirzepatide [facility]) 2.5 MG/0.5ML Pen Inject 2.5 mg into the skin once  a week. Patient not taking: Reported on 08/02/2021 05/18/21   Earl Lagos, MD  warfarin (COUMADIN) 4 MG tablet Take one tablet on Mondays, Wednesdays and Fridays. Take one-half tablet on Sundays, Tuesdays, Thursdays and Saturdays. 08/02/21   Elicia Lamp, RPH-CPP      Allergies    Morphine sulfate, Penicillins, Cabbage, Keflex [cephalexin], Other, Shellfish allergy, Tomato, and Latex    Review of Systems   Review of Systems  Musculoskeletal:        Right knee pain  All other systems reviewed and are negative.   Physical Exam Updated Vital Signs BP (!) 176/85   Pulse 76   Temp 98.3 F (36.8 C) (Oral)   Resp (!) 22   Ht 5\' 1"  (1.549 m)    Wt 121.6 kg   SpO2 98%   BMI 50.64 kg/m  Physical Exam Vitals and nursing note reviewed.  Constitutional:      Appearance: Normal appearance.  HENT:     Head: Normocephalic.     Nose: Nose normal.     Mouth/Throat:     Mouth: Mucous membranes are moist.  Eyes:     Extraocular Movements: Extraocular movements intact.     Pupils: Pupils are equal, round, and reactive to light.  Cardiovascular:     Rate and Rhythm: Normal rate and regular rhythm.     Pulses: Normal pulses.     Heart sounds: Normal heart sounds.  Pulmonary:     Effort: Pulmonary effort is normal.     Breath sounds: Normal breath sounds.  Abdominal:     General: Abdomen is flat.     Palpations: Abdomen is soft.  Musculoskeletal:     Cervical back: Normal range of motion and neck supple.     Comments: Ecchymosis of the right knee.  Patient also has diffuse tenderness of the femur and also tib-fib.  No spinal tenderness.  Neurological:     General: No focal deficit present.     Mental Status: She is alert and oriented to person, place, and time.  Psychiatric:        Mood and Affect: Mood normal.     ED Results / Procedures / Treatments   Labs (all labs ordered are listed, but only abnormal results are displayed) Labs Reviewed  CBC WITH DIFFERENTIAL/PLATELET - Abnormal; Notable for the following components:      Result Value   Hemoglobin 10.0 (*)    HCT 33.3 (*)    MCV 79.7 (*)    MCH 23.9 (*)    RDW 16.1 (*)    All other components within normal limits  COMPREHENSIVE METABOLIC PANEL  PROTIME-INR  CBG MONITORING, ED  TROPONIN I (HIGH SENSITIVITY)    EKG EKG Interpretation  Date/Time:  Wednesday August 25 2021 17:33:50 EDT Ventricular Rate:  70 PR Interval:  172 QRS Duration: 142 QT Interval:  438 QTC Calculation: 473 R Axis:   -65 Text Interpretation: Normal sinus rhythm Right bundle branch block Left anterior fascicular block Minimal voltage criteria for LVH, may be normal variant ( R in  aVL ) Abnormal ECG When compared with ECG of 23-Jul-2021 11:32, No significant change was found Confirmed by 25-Jul-2021 [phone]) on 08/25/2021 9:03:23 PM  Radiology DG Knee Complete 4 Views Right  Result Date: 08/25/2021 CLINICAL DATA:  Fall, pain to hip and knee. EXAM: RIGHT KNEE - COMPLETE 4+ VIEW COMPARISON:  None available FINDINGS: Moderate-to-marked tricompartmental osteoarthritic changes greatest in lateral compartment. No joint effusion. No sign of  dislocation. Also regularity of the lateral femoral condyle in the area of the lateral femoral notch with loss of cortical line in this area. IMPRESSION: 1. Moderate-to-marked tricompartmental osteoarthritic changes. No displaced fracture. 2. Irregularity of the lateral femoral notch along the lateral femoral condyle on lateral view could reflect sequela of degenerative changes are small sub chondral fracture. Correlate with point tenderness in this area. Electronically Signed   By: Donzetta Kohut M.D.   On: 08/25/2021 18:02    Procedures Procedures     Medications Ordered in ED Medications  meclizine (ANTIVERT) tablet 25 mg (0 mg Oral Hold 08/25/21 2119)  morphine (PF) 4 MG/ML injection 4 mg (4 mg Intravenous Not Given 08/25/21 2120)    ED Course/ Medical Decision Making/ A&P                           Medical Decision Making BRIHANY BUTCH is a 76 y.o. female here presenting with right knee pain and fall.  Patient likely had a mechanical fall.  However she did not quite remember. Patient is on Coumadin and has ecchymosis of the right knee area.  Consider tibial plateau fracture versus tibial fracture versus contusion.  Plan to get CBC and CMP and INR and CT of the knee and lower extremity x-rays  10:57 PM I reviewed patient's labs and independently interpreted imaging studies.  Patient's hemoglobin is 10 which is baseline.  Her INR is 3.1.  X-ray showed arthritis.  CT did not show tibial plateau fracture.  However there is a possible  loculated effusion in the right anterior lateral subcutaneous fat.  It was read as possible abscess but patient has no leukocytosis and no fever.  Clinically patient has hematoma in that area.  Patient did not want any pain medicine here.  I told her that she likely will have worsening ecchymosis in that area.  I told her to hold Coumadin for 2 days.  Will prescribe short course of pain medicine.  I also gave her knee immobilizer and told her to keep the leg elevated.  We will give her Ortho follow-up as well.    Problems Addressed: Hematoma of right lower extremity, initial encounter: acute illness or injury Supratherapeutic INR: acute illness or injury  Amount and/or Complexity of Data Reviewed Labs: ordered. Decision-making details documented in ED Course. Radiology: ordered and independent interpretation performed. Decision-making details documented in ED Course. ECG/medicine tests: ordered and independent interpretation performed. Decision-making details documented in ED Course.  Risk Prescription drug management.    Final Clinical Impression(s) / ED Diagnoses Final diagnoses:  None    Rx / DC Orders ED Discharge Orders     None         Charlynne Pander, MD 08/25/21 2259

## 2021-08-25 NOTE — ED Triage Notes (Signed)
Pt was at [employer] when fell, unknown what happen causing her to fall between concrete parking bar and side walk. Pt landed on right side. Having pain from right hip down leg. Denies LOC or hitting head. Pt does take Coumadin.

## 2021-08-25 NOTE — ED Provider Notes (Signed)
[facility]    CSN: [id number] Arrival date & time: 08/25/21  1234      History   Chief Complaint Chief Complaint  Patient presents with   Fall   Leg Pain   Hip Pain    HPI Caroline [name] is a 76 y.o. female.   Patient presents for further evaluation of right lower extremity pain after fall that occurred around 11 AM today.  Patient reports that she was at the [facility] when she "started falling forward" and landed on her right side.  Patient reports pain is present from mid upper leg all the way down to the ankle.  Denies pain in hip or foot.  Patient does take Coumadin.  Denies hitting head or losing consciousness.  Fall was witnessed.  Denies dizziness or any symptoms prior to fall.  Patient states difficulty bearing weight due to pain.   Fall  Leg Pain Hip Pain    Past Medical History:  Diagnosis Date   Arthritis    Asthma    Bronchitis    CHF (congestive heart failure) (HCC)    Diabetes (HCC) 2019   Diverticulitis    DVT, lower extremity, recurrent (HCC)    Patient had unprovoked PE on 2002 and DVT in right lower extremety 2008.   Frequent urination 03/21/2019   GERD (gastroesophageal reflux disease)    Hematuria, microscopic 09/13/2017   History of kidney stones    Hypertension    Lower abdominal pain 02/20/2018   PE (pulmonary embolism)    Patient had unprovoked PE on 2002   PONV (postoperative nausea and vomiting)    Vertigo     Patient Active Problem List   Diagnosis Date Noted   History of UTI 08/02/2021   Urinary tract infection 07/24/2021   Nausea, vomiting, and diarrhea 07/23/2021   Positive fecal immunochemical test 07/28/2020   Elevated INR    Diverticulitis 12/20/2018   GERD (gastroesophageal reflux disease) 01/30/2018   Hx of bee sting allergy 10/31/2017   Depression 10/31/2017   Asthma, chronic, mild persistent, uncomplicated 08/29/2017   Type 2 diabetes mellitus with complication, without long-term current use of insulin  (HCC) 06/02/2017   Hypokalemia 05/01/2017   History of Clostridium difficile colitis 12/31/2016   Anticoagulated on warfarin    Orthostatic hypotension    Lipodermatosclerosis 10/11/2016   Morbid obesity with BMI of 50.0-59.9, adult (HCC) 05/02/2016   Chronic combined systolic and diastolic CHF, NYHA class 1 (HCC) 09/21/2015   DVT, lower extremity, recurrent (HCC)    Vertigo 09/13/2011   Healthcare maintenance 09/13/2011   Allergic rhinitis 06/21/2006   Hyperlipidemia 06/10/2006   Essential hypertension 06/10/2006    Past Surgical History:  Procedure Laterality Date   ABDOMINAL HYSTERECTOMY     1980   CYSTOSCOPY W/ URETERAL STENT PLACEMENT Right 09/16/2015   Procedure: CYSTOSCOPY WITH RETROGRADE PYELOGRAM/URETERAL STENT PLACEMENT;  Surgeon: Alfredo Martinez, MD;  Location: [facility] OR;  Service: Urology;  Laterality: Right;   CYSTOSCOPY WITH RETROGRADE PYELOGRAM, URETEROSCOPY AND STENT PLACEMENT Right 12/11/2015   Procedure: RIGHT URETEROSCOPY/HOLMIUM LASER LITHOTRIPSY AND STONE REMOVAL removal and placement of double j stent;  Surgeon: Crist Fat, MD;  Location: [facility];  Service: Urology;  Laterality: Right;   HOLMIUM LASER APPLICATION Right 12/11/2015   Procedure: HOLMIUM LASER APPLICATION;  Surgeon: Crist Fat, MD;  Location: [facility];  Service: Urology;  Laterality: Right;    OB History   No obstetric history on file.      Home Medications  Prior to Admission medications   Medication Sig Start Date End Date Taking? Authorizing Provider  acetaminophen (TYLENOL) 500 MG tablet Take 500 mg by mouth 4 (four) times daily as needed for mild pain or headache.    [provider]  carvedilol (COREG) 12.5 MG tablet Take 1 tablet (12.5 mg total) by mouth 2 (two) times daily with a meal. 08/03/21 01/30/22  Katsadouros, Vasilios, MD  cholecalciferol (VITAMIN D) 25 MCG (1000 UNIT) tablet TAKE 1 TABLET BY MOUTH EVERY DAY 06/21/21   Earl Lagos, MD  diclofenac Sodium  (VOLTAREN) 1 % GEL Apply 4 g topically 4 (four) times daily. 07/25/21   Morene Crocker, MD  fluticasone (FLONASE) 50 MCG/ACT nasal spray PLACE 2 SPRAYS DAILY INTO BOTH NOSTRILS. 03/29/21   Earl Lagos, MD  hydrocortisone (PROCTOSOL HC) 2.5 % rectal cream Place 1 application rectally 2 (two) times daily. Patient taking differently: Place 1 application  rectally 2 (two) times daily as needed for hemorrhoids. 03/01/21   Atway, Rayann N, DO  losartan (COZAAR) 25 MG tablet Take 0.5 tablets (12.5 mg total) by mouth daily. 08/03/21 08/03/22  Belva Agee, MD  meclizine (ANTIVERT) 25 MG tablet TAKE 1 TABLET BY MOUTH THREE TIMES A DAY AS NEEDED 06/15/21   Earl Lagos, MD  metFORMIN (GLUCOPHAGE-XR) 500 MG 24 hr tablet Take 1 tablet (500 mg total) by mouth 2 (two) times daily with a meal. 03/01/21   Atway, Rayann N, DO  methocarbamol (ROBAXIN) 500 MG tablet Take 1 tablet (500 mg total) by mouth 2 (two) times daily as needed for up to 10 doses for muscle spasms. 07/25/21   Morene Crocker, MD  montelukast (SINGULAIR) 10 MG tablet TAKE 1 TABLET BY MOUTH EVERYDAY AT BEDTIME 07/06/21   Earl Lagos, MD  ondansetron (ZOFRAN) 4 MG tablet Take 1 tablet (4 mg total) by mouth daily as needed for nausea or vomiting. 08/03/21 08/03/22  Katsadouros, Heidi Dach, MD  pantoprazole (PROTONIX) 40 MG tablet TAKE 1 TABLET BY MOUTH EVERY DAY 05/11/21   Earl Lagos, MD  PROAIR HFA 108 (90 Base) MCG/ACT inhaler TAKE 2 PUFFS BY MOUTH EVERY 6 HOURS AS NEEDED FOR WHEEZE OR SHORTNESS OF BREATH 01/30/18   Earl Lagos, MD  Psyllium (METAMUCIL PO) Take by mouth at bedtime. gummies    [provider]  RESTASIS 0.05 % ophthalmic emulsion INSTILL 1 DROP INTO BOTH EYES TWICE A DAY 08/14/20   Carlynn Purl C, DO  rosuvastatin (CRESTOR) 20 MG tablet TAKE 1 TABLET BY MOUTH EVERY DAY 02/03/21   Earl Lagos, MD  SYMBICORT 160-4.5 MCG/ACT inhaler TAKE 2 PUFFS BY MOUTH TWICE A DAY 10/19/20   Earl Lagos, MD  tirzepatide [facility]) 2.5 MG/0.5ML Pen Inject 2.5 mg into the skin once a week. Patient not taking: Reported on 08/02/2021 05/18/21   Earl Lagos, MD  warfarin (COUMADIN) 4 MG tablet Take one tablet on Mondays, Wednesdays and Fridays. Take one-half tablet on Sundays, Tuesdays, Thursdays and Saturdays. 08/02/21   Elicia Lamp, RPH-CPP    Family History Family History  Problem Relation Age of Onset   Cancer Mother    Hypertension Sister    Diabetes Sister    Hypertension Brother    Diabetes Brother    Hypertension Sister    Diabetes Sister    Colon cancer Other    Heart Problems Other 34       sister's child, open heart surgery   CVA Father    Diabetes Son    Hypertension Son    Kidney  disease Son        on dialysis   Hypertension Sister    Diabetes Sister    Hypertension Sister    Diabetes Sister    Cancer Brother    Hypertension Son    Diabetes Son    Hypertension Daughter    Schizophrenia Daughter     Social History Social History   Tobacco Use   Smoking status: Former    Types: Cigarettes    Quit date: 09/18/1988    Years since quitting: 32.9   Smokeless tobacco: Never   Tobacco comments:    6 pack year smoking history as a teen  Vaping Use   Vaping Use: Never used  Substance Use Topics   Alcohol use: No   Drug use: No     Allergies   Penicillins, Cabbage, Keflex [cephalexin], Other, Shellfish allergy, Tomato, and Latex   Review of Systems Review of Systems Per HPI  Physical Exam Triage Vital Signs ED Triage Vitals  Enc Vitals Group     BP 08/25/21 1456 138/83     Pulse Rate 08/25/21 1456 68     Resp 08/25/21 1456 20     Temp 08/25/21 1456 97.9 F (36.6 C)     Temp Source 08/25/21 1456 Oral     SpO2 08/25/21 1456 97 %     Weight --      Height --      Head Circumference --      Peak Flow --      Pain Score 08/25/21 1455 9     Pain Loc --      Pain Edu? --      Excl. in GC? --    No data found.  Updated Vital  Signs BP 138/83 (BP Location: Left Arm)   Pulse 68   Temp 97.9 F (36.6 C) (Oral)   Resp 20   SpO2 97%   Visual Acuity Right Eye Distance:   Left Eye Distance:   Bilateral Distance:    Right Eye Near:   Left Eye Near:    Bilateral Near:     Physical Exam Constitutional:      General: She is not in acute distress.    Appearance: Normal appearance. She is not toxic-appearing or diaphoretic.  HENT:     Head: Normocephalic and atraumatic.  Eyes:     Extraocular Movements: Extraocular movements intact.     Conjunctiva/sclera: Conjunctivae normal.  Pulmonary:     Effort: Pulmonary effort is normal.  Musculoskeletal:     Comments: Tenderness to palpation that starts at mid right thigh and extends all the way down to ankle.  It is generalized.  There is associated bruising with mild swelling located to anterior knee.  Patient has full range of motion of knee, ankle, foot, toes.  Capillary refill and pulses normal. No other discoloration noted. Patient is in wheelchair in exam room.   Neurological:     General: No focal deficit present.     Mental Status: She is alert and oriented to person, place, and time. Mental status is at baseline.  Psychiatric:        Mood and Affect: Mood normal.        Behavior: Behavior normal.        Thought Content: Thought content normal.        Judgment: Judgment normal.      UC Treatments / Results  Labs (all labs ordered are listed, but only abnormal results are displayed) Labs  Reviewed - No data to display  EKG   Radiology No results found.  Procedures Procedures (including critical care time)  Medications Ordered in UC Medications - No data to display  Initial Impression / Assessment and Plan / UC Course  I have reviewed the triage vital signs and the nursing notes.  Pertinent labs & imaging results that were available during my care of the patient were reviewed by me and considered in my medical decision making (see chart for  details).     X-rays were ordered to evaluate from femur to ankle.  X-ray tech attempted x-rays but stated that given patient cooperation, limited mobility, and type of x-ray machine here in urgent care, she was not able to perform x-rays in urgent care. Discussed this with patient.  Advised patient that it is highly important that she get imaging completed.  Patient was educated on [facility] [facility] and that they have more available resources that may be conducive to evaluating patient.  Patient was agreeable to going to the [facility] Center ER for further evaluation.  Patient left via her family member transporting her. Final Clinical Impressions(s) / UC Diagnoses   Final diagnoses:  Fall, initial encounter  Right leg pain     Discharge Instructions      Go to the emergency department as soon as you leave urgent care for further evaluation and management.     ED Prescriptions   None    PDMP not reviewed this encounter.   Gustavus Bryant, [place] 08/25/21 1550

## 2021-08-25 NOTE — ED Notes (Signed)
Pt awake and alert- GCS 15 however pt frequently questions information already given.  Denies hitting head; states RLE pain only.  RR even and unlabored on RA with symmetrical rise and fall of chest.  Abdomen soft, obese.  BLE edema noted.  Continuous cardiac and pulse ox monitoring maintained.  Will monitor for acute changes and maintain plan of care.

## 2021-08-25 NOTE — Discharge Instructions (Addendum)
You have a hematoma from your fall.  There were no fractures on the x-ray or CT scan.  As we discussed, you should hold Coumadin for 2 days.  Then you can restart it.  Please keep the leg elevated and try compression stockings and the knee immobilizer to help with the hematoma.  You need to follow-up with Dr. Jena Gauss from orthopedic surgery next week  Please also follow-up with your primary care doctor at the internal medicine clinic.  Return to ER if you have worse knee pain, passing out.

## 2021-08-25 NOTE — ED Notes (Signed)
Pt agreeable with d/c plan as discussed by provider- this nurse has verbally reinforced d/c instructions and provided pt with written copy- pt acknowledges verbal understanding and denies any additional questions, concerns, needs- pt escorted to veh by ED tech Asher Muir and family members. R knee immobilizer securely in place with Lidocaine patch to knee - R knee ecchymosis visible upon assessment; RLE distal neurovascular status remains intact.

## 2021-08-25 NOTE — ED Notes (Signed)
Pt now returned after completing imaging studies - no acute changes noted

## 2021-08-25 NOTE — Discharge Instructions (Addendum)
Go to the emergency department as soon as you leave urgent care for further evaluation and management. 

## 2021-08-25 NOTE — ED Notes (Signed)
Pt's CBG result was 77. Informed Tim - RN.

## 2021-08-25 NOTE — ED Notes (Signed)
Patient is being discharged from the Urgent Care and sent to the Emergency Department via POV . Per Ervin Knack NP , patient is in need of higher level of care due to hip and leg pain causing patient unable to transfer to imaging . Patient is aware and verbalizes understanding of plan of care.  Vitals:   08/25/21 1456  BP: 138/83  Pulse: 68  Resp: 20  Temp: 97.9 F (36.6 C)  SpO2: 97%

## 2021-08-26 ENCOUNTER — Telehealth: Payer: Self-pay | Admitting: *Deleted

## 2021-08-26 NOTE — Telephone Encounter (Signed)
Yes, agree, thank you

## 2021-08-26 NOTE — Telephone Encounter (Signed)
Received call from Liji, PT with [facility]. She has been seeing this patient for at least 2 months. She was going to d/c her tomorrow but patient fell. She will reevaluate her next Thursday per patient request. VO given to move d/c evaluation to next week.

## 2021-08-30 ENCOUNTER — Encounter: Payer: Self-pay | Admitting: Internal Medicine

## 2021-08-30 ENCOUNTER — Ambulatory Visit (INDEPENDENT_AMBULATORY_CARE_PROVIDER_SITE_OTHER): Payer: Medicare Other | Admitting: Internal Medicine

## 2021-08-30 ENCOUNTER — Ambulatory Visit (INDEPENDENT_AMBULATORY_CARE_PROVIDER_SITE_OTHER): Payer: Medicare Other | Admitting: Pharmacist

## 2021-08-30 VITALS — BP 110/58 | HR 78 | Temp 98.5°F

## 2021-08-30 DIAGNOSIS — Z7901 Long term (current) use of anticoagulants: Secondary | ICD-10-CM

## 2021-08-30 DIAGNOSIS — I82409 Acute embolism and thrombosis of unspecified deep veins of unspecified lower extremity: Secondary | ICD-10-CM | POA: Diagnosis not present

## 2021-08-30 DIAGNOSIS — W19XXXA Unspecified fall, initial encounter: Secondary | ICD-10-CM | POA: Insufficient documentation

## 2021-08-30 DIAGNOSIS — I82402 Acute embolism and thrombosis of unspecified deep veins of left lower extremity: Secondary | ICD-10-CM

## 2021-08-30 DIAGNOSIS — E118 Type 2 diabetes mellitus with unspecified complications: Secondary | ICD-10-CM

## 2021-08-30 LAB — POCT GLYCOSYLATED HEMOGLOBIN (HGB A1C): Hemoglobin A1C: 6.1 % — AB (ref 4.0–5.6)

## 2021-08-30 LAB — POCT INR: INR: 1.7 — AB (ref 2.0–3.0)

## 2021-08-30 LAB — GLUCOSE, CAPILLARY: Glucose-Capillary: 136 mg/dL — ABNORMAL HIGH (ref 70–99)

## 2021-08-30 MED ORDER — RIVAROXABAN 10 MG PO TABS
10.0000 mg | ORAL_TABLET | Freq: Every day | ORAL | 3 refills | Status: DC
Start: 2021-08-30 — End: 2021-12-14

## 2021-08-30 NOTE — Assessment & Plan Note (Signed)
The patient was seen in the emergency room on 8/16 after she fell (while on coumadin).  Patient states she was at [facility] and tripped over the curb and landed on her right leg/knee.  She denies any trauma to her head, loss of consciousness, or any other symptoms aside from leg pain.  In the emergency department, the patient had imaging of her right leg and was noted to have hematoma in her right knee, without any evidence of bone fractures/dislocations.  She was given hydrocodone to take as needed for pain, but she has not needed at this.  She has been taking Robaxin as needed for muscle spasms and this has been helping to keep her pain under control.  Additionally, the patient was given a leg immobilizer from the emergency room and was advised to follow-up with orthopedic surgery, however the referral was not placed.  I had advised the patient to go to the [facility] orthopedic walk-in clinic to get a recommendation on how long she should wear the leg immobilizer and when it would be safe to bear weight on her leg again.

## 2021-08-30 NOTE — Progress Notes (Signed)
Anticoagulation Management Caroline [name] is a 76 y.o. female who reports to the clinic for monitoring of warfarin treatment.    Indication: DVT , remote history of left lower extremity; long term current use of oral anticoagulation with warfarin. Target INR 2.0 - 3.0. Shared decision making with the patient--patient with advice and consent elects to discontinue warfarin and commence treatment/secondary prophylaxis with rivaroxaban/Xarelto 10mg  PO once-daily.   Duration:  Warfarin is being DISCONTINUED and patient is being transitioned onto rivaroxaban/Xarelto 10mg  po once-daily.  Supervising physician:  , MD  Anticoagulation Clinic Visit History: Patient does report signs/symptoms of bleeding or thromboembolism as shown in the patient findings as well as documented in the findings of her ED visit.  Other recent changes: Yes.  diet, medications, lifestyle: Shared decision making with the patient--patient with advice and consent elects to discontinue warfarin and commence treatment/secondary prophylaxis with rivaroxaban/Xarelto 10mg  PO once-daily.  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] PULMONARY EMBOLISM HX OF (Resolved) [Z86.718]        Comments:           Allergies  Allergen Reactions   Morphine Sulfate Shortness Of Breath   Penicillins Anaphylaxis, Hives, Swelling and Rash    Has patient had a PCN reaction causing immediate rash, facial/tongue/throat swelling, SOB or lightheadedness with hypotension: Yes Has patient had a PCN reaction causing severe rash involving mucus membranes or skin necrosis: Yes Has patient had a PCN reaction that  required hospitalization Yes Has patient had a PCN reaction occurring within the last 10 years: No If all of the above answers are "NO", then may proceed with Cephalosporin use.    Cabbage Itching   Keflex [Cephalexin] Hives    And feeling of throat tightness   Other Itching    Lettuce - itching   Shellfish Allergy Swelling   Tomato Swelling   Latex Itching and Rash    Current Outpatient Medications:    acetaminophen (TYLENOL) 500 MG tablet, Take 500 mg by mouth 4 (four) times daily as needed for mild pain or headache., Disp: , Rfl:    carvedilol (COREG) 12.5 MG tablet, Take 1 tablet (12.5 mg total) by mouth 2 (two) times daily with a meal., Disp: 60 tablet, Rfl: 5   cholecalciferol (VITAMIN D) 25 MCG (1000 UNIT) tablet, TAKE 1 TABLET BY MOUTH EVERY DAY, Disp: 90 tablet, Rfl: 1   diclofenac Sodium (VOLTAREN) 1 % GEL, Apply 4 g topically 4 (four) times daily., Disp: 4 g, Rfl: 0   fluticasone (FLONASE) 50 MCG/ACT nasal spray, PLACE 2 SPRAYS DAILY INTO BOTH NOSTRILS., Disp: 48 mL, Rfl: 1   HYDROcodone-acetaminophen (NORCO/VICODIN) 5-325 MG tablet, Take 1 tablet by mouth every 6 (six) hours as needed., Disp: 10 tablet, Rfl: 0   hydrocortisone (PROCTOSOL HC) 2.5 % rectal cream, Place 1 application rectally 2 (two) times daily. (Patient taking differently: Place 1 application  rectally 2 (two) times daily as needed for hemorrhoids.), Disp: 30 g, Rfl: 0   losartan (COZAAR) 25 MG tablet, Take 0.5 tablets (12.5 mg total) by mouth daily., Disp: 30 tablet, Rfl: 5  meclizine (ANTIVERT) 25 MG tablet, TAKE 1 TABLET BY MOUTH THREE TIMES A DAY AS NEEDED, Disp: 90 tablet, Rfl: 5   metFORMIN (GLUCOPHAGE-XR) 500 MG 24 hr tablet, Take 1 tablet (500 mg total) by mouth 2 (two) times daily with a meal., Disp: 180 tablet, Rfl: 1   methocarbamol (ROBAXIN) 500 MG tablet, Take 1 tablet (500 mg total) by mouth 2 (two) times daily as needed for up to 10 doses for muscle spasms., Disp: 10 tablet, Rfl: 0   montelukast  (SINGULAIR) 10 MG tablet, TAKE 1 TABLET BY MOUTH EVERYDAY AT BEDTIME, Disp: 90 tablet, Rfl: 3   ondansetron (ZOFRAN) 4 MG tablet, Take 1 tablet (4 mg total) by mouth daily as needed for nausea or vomiting., Disp: 30 tablet, Rfl: 0   pantoprazole (PROTONIX) 40 MG tablet, TAKE 1 TABLET BY MOUTH EVERY DAY, Disp: 90 tablet, Rfl: 1   PROAIR HFA 108 (90 Base) MCG/ACT inhaler, TAKE 2 PUFFS BY MOUTH EVERY 6 HOURS AS NEEDED FOR WHEEZE OR SHORTNESS OF BREATH, Disp: 8.5 Inhaler, Rfl: 2   Psyllium (METAMUCIL PO), Take by mouth at bedtime. gummies, Disp: , Rfl:    RESTASIS 0.05 % ophthalmic emulsion, INSTILL 1 DROP INTO BOTH EYES TWICE A DAY, Disp: 60 mL, Rfl: 1   rivaroxaban (XARELTO) 10 MG TABS tablet, Take 1 tablet (10 mg total) by mouth daily., Disp: 90 tablet, Rfl: 3   rosuvastatin (CRESTOR) 20 MG tablet, TAKE 1 TABLET BY MOUTH EVERY DAY, Disp: 90 tablet, Rfl: 3   SYMBICORT 160-4.5 MCG/ACT inhaler, TAKE 2 PUFFS BY MOUTH TWICE A DAY, Disp: 30.6 each, Rfl: 4   tirzepatide (MOUNJARO) 2.5 MG/0.5ML Pen, Inject 2.5 mg into the skin once a week., Disp: 2 mL, Rfl: 0 Past Medical History:  Diagnosis Date   Arthritis    Asthma    Bronchitis    CHF (congestive heart failure) (HCC)    Diabetes (HCC) 2019   Diverticulitis    DVT, lower extremity, recurrent (HCC)    Patient had unprovoked PE on 2002 and DVT in right lower extremety 2008.   Elevated INR    Frequent urination 03/21/2019   GERD (gastroesophageal reflux disease)    Hematuria, microscopic 09/13/2017   History of kidney stones    Hypertension    Lower abdominal pain 02/20/2018   PE (pulmonary embolism)    Patient had unprovoked PE on 2002   PONV (postoperative nausea and vomiting)    Vertigo    Social History   Socioeconomic History   Marital status: Divorced    Spouse name: Not on file   Number of children: 5   Years of education: 9 th grade   Highest education level: 9th grade  Occupational History   Occupation: retired  Tobacco Use    Smoking status: Former    Types: Cigarettes    Quit date: 09/18/1988    Years since quitting: 32.9   Smokeless tobacco: Never   Tobacco comments:    6 pack year smoking history as a teen  Vaping Use   Vaping Use: Never used  Substance and Sexual Activity   Alcohol use: No   Drug use: No   Sexual activity: Not Currently    Birth control/protection: None  Other Topics Concern   Not on file  Social History Narrative   Current Social History 08/28/2019   Patient lives with 20 yo granddaughter in a one story home. There are not steps up to the entrance the patient uses.  Patient's method of transportation is church member.      The highest level of education was 9th grade      The patient currently retired.      Identified important Relationships are God, family       Pets : 1 lab/pitt mix named Cinnamon       Interests / Fun: Church,TV       Current Stressors: "Me and my granddaughter"       Religious / Personal Beliefs: I'm Holiness and I love people"       Other: "I'd give away my last dime."       L. Ducatte, BSN, RN-BC    Social Determinants of Health   Financial Resource Strain: Medium Risk (05/17/2017)   Overall Financial Resource Strain (CARDIA)    Difficulty of Paying Living Expenses: Somewhat hard  Food Insecurity: Food Insecurity Present (10/28/2019)   Hunger Vital Sign    Worried About Running Out of Food in the Last Year: Sometimes true    Ran Out of Food in the Last Year: Never true  Transportation Needs: Unmet Transportation Needs (05/17/2017)   PRAPARE - Administrator, Civil Service (Medical): No    Lack of Transportation (Non-Medical): Yes  Physical Activity: Insufficiently Active (05/17/2017)   Exercise Vital Sign    Days of Exercise per Week: 2 days    Minutes of Exercise per Session: 20 min  Stress: Stress Concern Present (05/17/2017)   [employer] of Occupational Health - Occupational Stress Questionnaire    Feeling of Stress : To  some extent  Social Connections: Somewhat Isolated (05/17/2017)   Social Connection and Isolation Panel [NHANES]    Frequency of Communication with Friends and Family: More than three times a week    Frequency of Social Gatherings with Friends and Family: Once a week    Attends Religious Services: More than 4 times per year    Active Member of [employer] or Organizations: No    Attends Banker Meetings: Never    Marital Status: Divorced   Family History  Problem Relation Age of Onset   Cancer Mother    Hypertension Sister    Diabetes Sister    Hypertension Brother    Diabetes Brother    Hypertension Sister    Diabetes Sister    Colon cancer Other    Heart Problems Other 34       sister's child, open heart surgery   CVA Father    Diabetes Son    Hypertension Son    Kidney disease Son        on dialysis   Hypertension Sister    Diabetes Sister    Hypertension Sister    Diabetes Sister    Cancer Brother    Hypertension Son    Diabetes Son    Hypertension Daughter    Schizophrenia Daughter     ASSESSMENT Recent Results: The most recent result is correlated with 20 mg per week up until her visit in the ED, at which time, she was advised in the setting of a hematoma to her right lower extremity sustained by a fall (INR at the visit to the ED was 3.1).  Lab Results  Component Value Date   INR 1.7 (A) 08/30/2021   INR 3.1 (H) 08/25/2021   INR 2.1 08/02/2021    Anticoagulation Dosing: Description   Shared decision making with the patient--patient with advice and consent elects to discontinue warfarin and commence treatment/secondary prophylaxis  with rivaroxaban/Xarelto 10mg  PO once-daily.      INR today: Subtherapeutic, permitting commencing of rivaroxaban/Xarelt, 10mg  PO once-daily.   PLAN Weekly dose was N/A. Shared decision making with the patient--patient with advice and consent elects to discontinue warfarin and commence treatment/secondary prophylaxis with  rivaroxaban/Xarelto 10mg  PO once-daily.   Patient Instructions  Patient instructed to take medications as defined in the Anti-coagulation Track section of this encounter.  Patient instructed to DISCONTINUE your warfarin/Coumadin. You are being transitioned on to rivaroxaban/Xarelto, 10mg  tablet. Take ONE (1) of these tablets, by mouth, once daily.   Patient instructed to take rivaroxaban/Xarelto, 10mg  by mouth, once-daily.  Patient verbalized understanding of these instructions.   Patient advised to contact clinic or seek medical attention if signs/symptoms of bleeding or thromboembolism occur.  Patient verbalized understanding by repeating back information and was advised to contact me if further medication-related questions arise. Patient was also provided an information handout.  Follow-up Return if symptoms worsen or fail to improve.  Pennie Banter, PharmD, CPP  15 minutes spent face-to-face with the patient during the encounter. 50% of time spent on education, including signs/sx bleeding and clotting, as well as food and drug interactions with warfarin. 50% of time was spent on fingerprick POC INR sample collection,processing, results determination, and documentation in http://www.kim.net/.

## 2021-08-30 NOTE — Progress Notes (Signed)
CC: ED follow up  HPI:  [name] is a 76 y.o. female living with a history stated below and presents today for a follow up after being seen in the ED after a fall. Please see problem based assessment and plan for additional details.  Past Medical History:  Diagnosis Date   Arthritis    Asthma    Bronchitis    CHF (congestive heart failure) (HCC)    Diabetes (HCC) 2019   Diverticulitis    DVT, lower extremity, recurrent (HCC)    Patient had unprovoked PE on 2002 and DVT in right lower extremety 2008.   Elevated INR    Frequent urination 03/21/2019   GERD (gastroesophageal reflux disease)    Hematuria, microscopic 09/13/2017   History of kidney stones    Hypertension    Lower abdominal pain 02/20/2018   PE (pulmonary embolism)    Patient had unprovoked PE on 2002   PONV (postoperative nausea and vomiting)    Vertigo     Current Outpatient Medications on File Prior to Visit  Medication Sig Dispense Refill   acetaminophen (TYLENOL) 500 MG tablet Take 500 mg by mouth 4 (four) times daily as needed for mild pain or headache.     carvedilol (COREG) 12.5 MG tablet Take 1 tablet (12.5 mg total) by mouth 2 (two) times daily with a meal. 60 tablet 5   cholecalciferol (VITAMIN D) 25 MCG (1000 UNIT) tablet TAKE 1 TABLET BY MOUTH EVERY DAY 90 tablet 1   diclofenac Sodium (VOLTAREN) 1 % GEL Apply 4 g topically 4 (four) times daily. 4 g 0   fluticasone (FLONASE) 50 MCG/ACT nasal spray PLACE 2 SPRAYS DAILY INTO BOTH NOSTRILS. 48 mL 1   HYDROcodone-acetaminophen (NORCO/VICODIN) 5-325 MG tablet Take 1 tablet by mouth every 6 (six) hours as needed. 10 tablet 0   hydrocortisone (PROCTOSOL HC) 2.5 % rectal cream Place 1 application rectally 2 (two) times daily. (Patient taking differently: Place 1 application  rectally 2 (two) times daily as needed for hemorrhoids.) 30 g 0   losartan (COZAAR) 25 MG tablet Take 0.5 tablets (12.5 mg total) by mouth daily. 30 tablet 5   meclizine (ANTIVERT) 25  MG tablet TAKE 1 TABLET BY MOUTH THREE TIMES A DAY AS NEEDED 90 tablet 5   metFORMIN (GLUCOPHAGE-XR) 500 MG 24 hr tablet Take 1 tablet (500 mg total) by mouth 2 (two) times daily with a meal. 180 tablet 1   methocarbamol (ROBAXIN) 500 MG tablet Take 1 tablet (500 mg total) by mouth 2 (two) times daily as needed for up to 10 doses for muscle spasms. 10 tablet 0   montelukast (SINGULAIR) 10 MG tablet TAKE 1 TABLET BY MOUTH EVERYDAY AT BEDTIME 90 tablet 3   ondansetron (ZOFRAN) 4 MG tablet Take 1 tablet (4 mg total) by mouth daily as needed for nausea or vomiting. 30 tablet 0   pantoprazole (PROTONIX) 40 MG tablet TAKE 1 TABLET BY MOUTH EVERY DAY 90 tablet 1   PROAIR HFA 108 (90 Base) MCG/ACT inhaler TAKE 2 PUFFS BY MOUTH EVERY 6 HOURS AS NEEDED FOR WHEEZE OR SHORTNESS OF BREATH 8.5 Inhaler 2   Psyllium (METAMUCIL PO) Take by mouth at bedtime. gummies     RESTASIS 0.05 % ophthalmic emulsion INSTILL 1 DROP INTO BOTH EYES TWICE A DAY 60 mL 1   rosuvastatin (CRESTOR) 20 MG tablet TAKE 1 TABLET BY MOUTH EVERY DAY 90 tablet 3   SYMBICORT 160-4.5 MCG/ACT inhaler TAKE 2 PUFFS BY MOUTH  TWICE A DAY 30.6 each 4   tirzepatide (MOUNJARO) 2.5 MG/0.5ML Pen Inject 2.5 mg into the skin once a week. 2 mL 0   No current facility-administered medications on file prior to visit.    Family History  Problem Relation Age of Onset   Cancer Mother    Hypertension Sister    Diabetes Sister    Hypertension Brother    Diabetes Brother    Hypertension Sister    Diabetes Sister    Colon cancer Other    Heart Problems Other 34       sister's child, open heart surgery   CVA Father    Diabetes Son    Hypertension Son    Kidney disease Son        on dialysis   Hypertension Sister    Diabetes Sister    Hypertension Sister    Diabetes Sister    Cancer Brother    Hypertension Son    Diabetes Son    Hypertension Daughter    Schizophrenia Daughter     Social History   Socioeconomic History   Marital status:  Divorced    Spouse name: Not on file   Number of children: 5   Years of education: 9 th grade   Highest education level: 9th grade  Occupational History   Occupation: retired  Tobacco Use   Smoking status: Former    Types: Cigarettes    Quit date: 09/18/1988    Years since quitting: 32.9   Smokeless tobacco: Never   Tobacco comments:    6 pack year smoking history as a teen  Vaping Use   Vaping Use: Never used  Substance and Sexual Activity   Alcohol use: No   Drug use: No   Sexual activity: Not Currently    Birth control/protection: None  Other Topics Concern   Not on file  Social History Narrative   Current Social History 08/28/2019   Patient lives with 22 yo granddaughter in a one story home. There are not steps up to the entrance the patient uses.       Patient's method of transportation is church member.      The highest level of education was 9th grade      The patient currently retired.      Identified important Relationships are God, family       Pets : 1 lab/pitt mix named Cinnamon       Interests / Fun: Church,TV       Current Stressors: "Me and my granddaughter"       Religious / Personal Beliefs: I'm Holiness and I love people"       Other: "I'd give away my last dime."       L. Ducatte, BSN, RN-BC    Social Determinants of Health   Financial Resource Strain: Medium Risk (05/17/2017)   Overall Financial Resource Strain (CARDIA)    Difficulty of Paying Living Expenses: Somewhat hard  Food Insecurity: Food Insecurity Present (10/28/2019)   Hunger Vital Sign    Worried About Running Out of Food in the Last Year: Sometimes true    Ran Out of Food in the Last Year: Never true  Transportation Needs: Unmet Transportation Needs (05/17/2017)   PRAPARE - Transportation    Lack of Transportation (Medical): No    Lack of Transportation (Non-Medical): Yes  Physical Activity: Insufficiently Active (05/17/2017)   Exercise Vital Sign    Days of Exercise per Week: 2  days  Minutes of Exercise per Session: 20 min  Stress: Stress Concern Present (05/17/2017)   [employer] of Occupational Health - Occupational Stress Questionnaire    Feeling of Stress : To some extent  Social Connections: Somewhat Isolated (05/17/2017)   Social Connection and Isolation Panel [NHANES]    Frequency of Communication with Friends and Family: More than three times a week    Frequency of Social Gatherings with Friends and Family: Once a week    Attends Religious Services: More than 4 times per year    Active Member of [employer] or Organizations: No    Attends Banker Meetings: Never    Marital Status: Divorced  Catering manager Violence: Not on file    Review of Systems: ROS negative except for what is noted on the assessment and plan.  Vitals:   08/30/21 1107  BP: (!) 110/58  Pulse: 78  Temp: 98.5 F (36.9 C)  TempSrc: Oral  SpO2: 98%    Physical Exam: Constitutional: obese, elderly female sitting in chair, in no acute distress Cardiovascular: regular rate and rhythm, no m/r/g Pulmonary/Chest: normal work of breathing on room air, lungs clear to auscultation bilaterally MSK: R knee/leg in immobilizer. Able to flex/extend R knee with limited ROM.  Neurological: alert & oriented x 3, no focal deficit Skin: warm and dry Psych: normal mood and behavior  Assessment & Plan:     Patient discussed with Dr. Lafonda Mosses  DVT, lower extremity, recurrent [facility]) The patient has a history of recurrent DVT/PE.  She had an unprovoked PE in 2002 and a DVT in her right lower extremity in 2008.  The patient has been on warfarin therapy for many years and was told that she would be on this medication indefinitely.  Given that there are no contraindications to DOAC therapy, Dr. Alexandria Lodge and I had a discussion with the patient and her two sons regarding discontinuing Coumadin and starting a DOAC instead.  The patient and her 2 sons are in agreement with this plan and would  like to start Xarelto.  Plan: - INR 1.7 today (must be <3 for xarelto initiation) - Start Xarelto 10 mg daily  Fall The patient was seen in the emergency room on 8/16 after she fell (while on coumadin).  Patient states she was at [facility] and tripped over the curb and landed on her right leg/knee.  She denies any trauma to her head, loss of consciousness, or any other symptoms aside from leg pain.  In the emergency department, the patient had imaging of her right leg and was noted to have hematoma in her right knee, without any evidence of bone fractures/dislocations.  She was given hydrocodone to take as needed for pain, but she has not needed at this.  She has been taking Robaxin as needed for muscle spasms and this has been helping to keep her pain under control.  Additionally, the patient was given a leg immobilizer from the emergency room and was advised to follow-up with orthopedic surgery, however the referral was not placed.  I had advised the patient to go to the [facility] orthopedic walk-in clinic to get a recommendation on how long she should wear the leg immobilizer and when it would be safe to bear weight on her leg again.   Elza Rafter, D.O. [facility] Internal Medicine, PGY-2 Phone: [phone] Date 08/30/2021 Time 11:58 AM

## 2021-08-30 NOTE — Assessment & Plan Note (Signed)
The patient has a history of recurrent DVT/PE.  She had an unprovoked PE in 2002 and a DVT in her right lower extremity in 2008.  The patient has been on warfarin therapy for many years and was told that she would be on this medication indefinitely.  Given that there are no contraindications to DOAC therapy, Dr. Alexandria Lodge and I had a discussion with the patient and her two sons regarding discontinuing Coumadin and starting a DOAC instead.  The patient and her 2 sons are in agreement with this plan and would like to start Xarelto.  Plan: - INR 1.7 today (must be <3 for xarelto initiation) - Start Xarelto 10 mg daily

## 2021-08-30 NOTE — Progress Notes (Deleted)
CC: ***  HPI:  [name] is a 76 y.o. female living with a history stated below and presents today for ***. Please see problem based assessment and plan for additional details.  Past Medical History:  Diagnosis Date   Arthritis    Asthma    Bronchitis    CHF (congestive heart failure) (HCC)    Diabetes (HCC) 2019   Diverticulitis    DVT, lower extremity, recurrent (HCC)    Patient had unprovoked PE on 2002 and DVT in right lower extremety 2008.   Frequent urination 03/21/2019   GERD (gastroesophageal reflux disease)    Hematuria, microscopic 09/13/2017   History of kidney stones    Hypertension    Lower abdominal pain 02/20/2018   PE (pulmonary embolism)    Patient had unprovoked PE on 2002   PONV (postoperative nausea and vomiting)    Vertigo     Current Outpatient Medications on File Prior to Visit  Medication Sig Dispense Refill   acetaminophen (TYLENOL) 500 MG tablet Take 500 mg by mouth 4 (four) times daily as needed for mild pain or headache.     carvedilol (COREG) 12.5 MG tablet Take 1 tablet (12.5 mg total) by mouth 2 (two) times daily with a meal. 60 tablet 5   cholecalciferol (VITAMIN D) 25 MCG (1000 UNIT) tablet TAKE 1 TABLET BY MOUTH EVERY DAY 90 tablet 1   diclofenac Sodium (VOLTAREN) 1 % GEL Apply 4 g topically 4 (four) times daily. 4 g 0   fluticasone (FLONASE) 50 MCG/ACT nasal spray PLACE 2 SPRAYS DAILY INTO BOTH NOSTRILS. 48 mL 1   HYDROcodone-acetaminophen (NORCO/VICODIN) 5-325 MG tablet Take 1 tablet by mouth every 6 (six) hours as needed. 10 tablet 0   hydrocortisone (PROCTOSOL HC) 2.5 % rectal cream Place 1 application rectally 2 (two) times daily. (Patient taking differently: Place 1 application  rectally 2 (two) times daily as needed for hemorrhoids.) 30 g 0   losartan (COZAAR) 25 MG tablet Take 0.5 tablets (12.5 mg total) by mouth daily. 30 tablet 5   meclizine (ANTIVERT) 25 MG tablet TAKE 1 TABLET BY MOUTH THREE TIMES A DAY AS NEEDED 90 tablet 5    metFORMIN (GLUCOPHAGE-XR) 500 MG 24 hr tablet Take 1 tablet (500 mg total) by mouth 2 (two) times daily with a meal. 180 tablet 1   methocarbamol (ROBAXIN) 500 MG tablet Take 1 tablet (500 mg total) by mouth 2 (two) times daily as needed for up to 10 doses for muscle spasms. 10 tablet 0   montelukast (SINGULAIR) 10 MG tablet TAKE 1 TABLET BY MOUTH EVERYDAY AT BEDTIME 90 tablet 3   ondansetron (ZOFRAN) 4 MG tablet Take 1 tablet (4 mg total) by mouth daily as needed for nausea or vomiting. 30 tablet 0   pantoprazole (PROTONIX) 40 MG tablet TAKE 1 TABLET BY MOUTH EVERY DAY 90 tablet 1   PROAIR HFA 108 (90 Base) MCG/ACT inhaler TAKE 2 PUFFS BY MOUTH EVERY 6 HOURS AS NEEDED FOR WHEEZE OR SHORTNESS OF BREATH 8.5 Inhaler 2   Psyllium (METAMUCIL PO) Take by mouth at bedtime. gummies     RESTASIS 0.05 % ophthalmic emulsion INSTILL 1 DROP INTO BOTH EYES TWICE A DAY 60 mL 1   rosuvastatin (CRESTOR) 20 MG tablet TAKE 1 TABLET BY MOUTH EVERY DAY 90 tablet 3   SYMBICORT 160-4.5 MCG/ACT inhaler TAKE 2 PUFFS BY MOUTH TWICE A DAY 30.6 each 4   tirzepatide (MOUNJARO) 2.5 MG/0.5ML Pen Inject 2.5 mg into the  skin once a week. (Patient not taking: Reported on 08/02/2021) 2 mL 0   warfarin (COUMADIN) 4 MG tablet Take one tablet on Mondays, Wednesdays and Fridays. Take one-half tablet on Sundays, Tuesdays, Thursdays and Saturdays. 20 tablet 2   No current facility-administered medications on file prior to visit.    Family History  Problem Relation Age of Onset   Cancer Mother    Hypertension Sister    Diabetes Sister    Hypertension Brother    Diabetes Brother    Hypertension Sister    Diabetes Sister    Colon cancer Other    Heart Problems Other 17       sister's child, open heart surgery   CVA Father    Diabetes Son    Hypertension Son    Kidney disease Son        on dialysis   Hypertension Sister    Diabetes Sister    Hypertension Sister    Diabetes Sister    Cancer Brother    Hypertension Son     Diabetes Son    Hypertension Daughter    Schizophrenia Daughter     Social History   Socioeconomic History   Marital status: Divorced    Spouse name: Not on file   Number of children: 5   Years of education: 9 th grade   Highest education level: 9th grade  Occupational History   Occupation: retired  Tobacco Use   Smoking status: Former    Types: Cigarettes    Quit date: 09/18/1988    Years since quitting: 32.9   Smokeless tobacco: Never   Tobacco comments:    6 pack year smoking history as a teen  Vaping Use   Vaping Use: Never used  Substance and Sexual Activity   Alcohol use: No   Drug use: No   Sexual activity: Not Currently    Birth control/protection: None  Other Topics Concern   Not on file  Social History Narrative   Current Social History 08/28/2019   Patient lives with 38 yo granddaughter in a one story home. There are not steps up to the entrance the patient uses.       Patient's method of transportation is church member.      The highest level of education was 9th grade      The patient currently retired.      Identified important Relationships are God, family       Pets : 1 lab/pitt mix named Cinnamon       Interests / Fun: Church,TV       Current Stressors: "Me and my granddaughter"       Religious / Personal Beliefs: I'm Holiness and I love people"       Other: "I'd give away my last dime."       L. Ducatte, BSN, RN-BC    Social Determinants of Health   Financial Resource Strain: Medium Risk (05/17/2017)   Overall Financial Resource Strain (CARDIA)    Difficulty of Paying Living Expenses: Somewhat hard  Food Insecurity: Food Insecurity Present (10/28/2019)   Hunger Vital Sign    Worried About Running Out of Food in the Last Year: Sometimes true    Ran Out of Food in the Last Year: Never true  Transportation Needs: Unmet Transportation Needs (05/17/2017)   PRAPARE - Transportation    Lack of Transportation (Medical): No    Lack of  Transportation (Non-Medical): Yes  Physical Activity: Insufficiently Active (05/17/2017)   Exercise  Vital Sign    Days of Exercise per Week: 2 days    Minutes of Exercise per Session: 20 min  Stress: Stress Concern Present (05/17/2017)   [employer] of Occupational Health - Occupational Stress Questionnaire    Feeling of Stress : To some extent  Social Connections: Somewhat Isolated (05/17/2017)   Social Connection and Isolation Panel [NHANES]    Frequency of Communication with Friends and Family: More than three times a week    Frequency of Social Gatherings with Friends and Family: Once a week    Attends Religious Services: More than 4 times per year    Active Member of [employer] or Organizations: No    Attends Banker Meetings: Never    Marital Status: Divorced  Catering manager Violence: Not on file    Review of Systems: ROS negative except for what is noted on the assessment and plan.  There were no vitals filed for this visit.  Physical Exam: Constitutional: well-appearing *** sitting in ***, in no acute distress HENT: normocephalic atraumatic, mucous membranes moist Eyes: conjunctiva non-erythematous Cardiovascular: regular rate and rhythm, no m/r/g Pulmonary/Chest: normal work of breathing on room air, lungs clear to auscultation bilaterally Abdominal: soft, non-tender, non-distended MSK: normal bulk and tone Neurological: alert & oriented x 3, no focal deficit Skin: warm and dry Psych: normal mood and behavior  Assessment & Plan:     Patient {GC/GE:[id number]::"discussed with","seen with"} Dr. {MGQQP:[id number]::"OIZTIWPY","K. Hoffman","Mullen","Narendra","Vincent","Guilloud","Lau","Machen"}  No problem-specific Assessment & Plan notes found for this encounter.   Elza Rafter, D.O. [facility], PGY-2 Phone: [phone] Date 08/30/2021 Time 11:04 AM

## 2021-08-30 NOTE — Patient Instructions (Signed)
Thank you, [name] for allowing [place] to provide your care today. Today we discussed:  Blood thinners: Caroline [name] re going to STOP coumadin and now we are starting you on a new blood thinner called Xarelto. You will take Xarelto 10 mg once a day. Now, you will not need to get your INR checked anymore. Please watch out for any blood in your urine, stool, or if you vomit up any blood. Knee injury: Please go to the [facility] in [facility]o clinic to see how long you need to wear the knee imobilizer I have ordered the following labs for you:   Lab Orders         POC Hbg A1C        Referrals ordered today:   Referral Orders  No referral(s) requested today     I have ordered the following medication/changed the following medications:   Stop the following medications: There are no discontinued medications.   Start the following medications: No orders of the defined types were placed in this encounter.    Follow up:  as needed for worsening knee pain      Should you have any questions or concerns please call the internal medicine clinic at [phone].     Elza Rafter, D.O. [facility]

## 2021-08-30 NOTE — Patient Instructions (Signed)
Patient instructed to take medications as defined in the Anti-coagulation Track section of this encounter.  Patient instructed to DISCONTINUE your warfarin/Coumadin. You are being transitioned on to rivaroxaban/Xarelto, 10mg  tablet. Take ONE (1) of these tablets, by mouth, once daily.   Patient instructed to take rivaroxaban/Xarelto, 10mg  by mouth, once-daily.  Patient verbalized understanding of these instructions.

## 2021-08-31 DIAGNOSIS — M25561 Pain in right knee: Secondary | ICD-10-CM | POA: Diagnosis not present

## 2021-09-01 NOTE — Progress Notes (Signed)
Internal Medicine Clinic Attending  Case discussed with Dr. Ned Card  At the time of the visit.  We reviewed the resident's history and exam and pertinent patient test results.  I agree with the assessment, diagnosis, and plan of care documented in the resident's note.     Agree with plan to change from Warfarin to Xarelto today. I reviewed the OR note, and it looks like patient was supposed to have Ortho follow-up outpatient, but this was not scheduled. Agree with plan for patient to go to Ortho Urgent Care this week to reassess because I'm not sure how long she should wear immobilizer or if any additional ortho workup is needed.

## 2021-09-02 DIAGNOSIS — D509 Iron deficiency anemia, unspecified: Secondary | ICD-10-CM | POA: Diagnosis not present

## 2021-09-02 DIAGNOSIS — N179 Acute kidney failure, unspecified: Secondary | ICD-10-CM | POA: Diagnosis not present

## 2021-09-02 DIAGNOSIS — I110 Hypertensive heart disease with heart failure: Secondary | ICD-10-CM | POA: Diagnosis not present

## 2021-09-02 DIAGNOSIS — I5042 Chronic combined systolic (congestive) and diastolic (congestive) heart failure: Secondary | ICD-10-CM | POA: Diagnosis not present

## 2021-09-02 DIAGNOSIS — N390 Urinary tract infection, site not specified: Secondary | ICD-10-CM | POA: Diagnosis not present

## 2021-09-10 DIAGNOSIS — N390 Urinary tract infection, site not specified: Secondary | ICD-10-CM | POA: Diagnosis not present

## 2021-09-10 DIAGNOSIS — D509 Iron deficiency anemia, unspecified: Secondary | ICD-10-CM | POA: Diagnosis not present

## 2021-09-10 DIAGNOSIS — N179 Acute kidney failure, unspecified: Secondary | ICD-10-CM | POA: Diagnosis not present

## 2021-09-10 DIAGNOSIS — I5042 Chronic combined systolic (congestive) and diastolic (congestive) heart failure: Secondary | ICD-10-CM | POA: Diagnosis not present

## 2021-09-10 DIAGNOSIS — I110 Hypertensive heart disease with heart failure: Secondary | ICD-10-CM | POA: Diagnosis not present

## 2021-09-12 ENCOUNTER — Other Ambulatory Visit: Payer: Self-pay | Admitting: Student

## 2021-09-12 ENCOUNTER — Other Ambulatory Visit: Payer: Self-pay | Admitting: Internal Medicine

## 2021-09-12 DIAGNOSIS — J301 Allergic rhinitis due to pollen: Secondary | ICD-10-CM

## 2021-09-16 DIAGNOSIS — I110 Hypertensive heart disease with heart failure: Secondary | ICD-10-CM | POA: Diagnosis not present

## 2021-09-16 DIAGNOSIS — D509 Iron deficiency anemia, unspecified: Secondary | ICD-10-CM | POA: Diagnosis not present

## 2021-09-16 DIAGNOSIS — N179 Acute kidney failure, unspecified: Secondary | ICD-10-CM | POA: Diagnosis not present

## 2021-09-16 DIAGNOSIS — I5042 Chronic combined systolic (congestive) and diastolic (congestive) heart failure: Secondary | ICD-10-CM | POA: Diagnosis not present

## 2021-09-16 DIAGNOSIS — N390 Urinary tract infection, site not specified: Secondary | ICD-10-CM | POA: Diagnosis not present

## 2021-09-21 ENCOUNTER — Telehealth: Payer: Self-pay | Admitting: *Deleted

## 2021-09-21 DIAGNOSIS — N390 Urinary tract infection, site not specified: Secondary | ICD-10-CM | POA: Diagnosis not present

## 2021-09-21 DIAGNOSIS — D509 Iron deficiency anemia, unspecified: Secondary | ICD-10-CM | POA: Diagnosis not present

## 2021-09-21 DIAGNOSIS — M25561 Pain in right knee: Secondary | ICD-10-CM | POA: Diagnosis not present

## 2021-09-21 DIAGNOSIS — N179 Acute kidney failure, unspecified: Secondary | ICD-10-CM | POA: Diagnosis not present

## 2021-09-21 DIAGNOSIS — I110 Hypertensive heart disease with heart failure: Secondary | ICD-10-CM | POA: Diagnosis not present

## 2021-09-21 DIAGNOSIS — I5042 Chronic combined systolic (congestive) and diastolic (congestive) heart failure: Secondary | ICD-10-CM | POA: Diagnosis not present

## 2021-09-21 NOTE — Telephone Encounter (Signed)
RTC to [place] given Verbal orders for PT per Dr. Heide Spark.

## 2021-09-21 NOTE — Telephone Encounter (Signed)
Call from [facility] from [facility].  Needs verbal orders to continue PT for 1 time a week x 4 weeks.  Gait training, Risk analyst and Fall Prevention.  Brett Canales can be reached at [phone].

## 2021-09-28 DIAGNOSIS — D509 Iron deficiency anemia, unspecified: Secondary | ICD-10-CM | POA: Diagnosis not present

## 2021-09-28 DIAGNOSIS — I110 Hypertensive heart disease with heart failure: Secondary | ICD-10-CM | POA: Diagnosis not present

## 2021-09-28 DIAGNOSIS — N390 Urinary tract infection, site not specified: Secondary | ICD-10-CM | POA: Diagnosis not present

## 2021-09-28 DIAGNOSIS — N179 Acute kidney failure, unspecified: Secondary | ICD-10-CM | POA: Diagnosis not present

## 2021-09-28 DIAGNOSIS — I5042 Chronic combined systolic (congestive) and diastolic (congestive) heart failure: Secondary | ICD-10-CM | POA: Diagnosis not present

## 2021-10-07 ENCOUNTER — Ambulatory Visit: Payer: Medicare Other

## 2021-10-07 DIAGNOSIS — N179 Acute kidney failure, unspecified: Secondary | ICD-10-CM | POA: Diagnosis not present

## 2021-10-07 DIAGNOSIS — D509 Iron deficiency anemia, unspecified: Secondary | ICD-10-CM | POA: Diagnosis not present

## 2021-10-07 DIAGNOSIS — N390 Urinary tract infection, site not specified: Secondary | ICD-10-CM | POA: Diagnosis not present

## 2021-10-07 DIAGNOSIS — I5042 Chronic combined systolic (congestive) and diastolic (congestive) heart failure: Secondary | ICD-10-CM | POA: Diagnosis not present

## 2021-10-07 DIAGNOSIS — I110 Hypertensive heart disease with heart failure: Secondary | ICD-10-CM | POA: Diagnosis not present

## 2021-10-11 ENCOUNTER — Ambulatory Visit (INDEPENDENT_AMBULATORY_CARE_PROVIDER_SITE_OTHER): Payer: Medicare Other

## 2021-10-11 ENCOUNTER — Other Ambulatory Visit: Payer: Self-pay | Admitting: Student

## 2021-10-11 ENCOUNTER — Ambulatory Visit: Payer: Medicare Other | Admitting: Pharmacist

## 2021-10-11 ENCOUNTER — Other Ambulatory Visit: Payer: Self-pay | Admitting: Internal Medicine

## 2021-10-11 ENCOUNTER — Ambulatory Visit: Payer: Medicare Other

## 2021-10-11 DIAGNOSIS — Z23 Encounter for immunization: Secondary | ICD-10-CM | POA: Diagnosis not present

## 2021-10-11 DIAGNOSIS — R112 Nausea with vomiting, unspecified: Secondary | ICD-10-CM

## 2021-10-11 DIAGNOSIS — I82402 Acute embolism and thrombosis of unspecified deep veins of left lower extremity: Secondary | ICD-10-CM

## 2021-10-11 DIAGNOSIS — J4521 Mild intermittent asthma with (acute) exacerbation: Secondary | ICD-10-CM

## 2021-10-11 LAB — POCT INR: INR: 1.3 — AB (ref 2.0–3.0)

## 2021-10-11 NOTE — Patient Instructions (Signed)
Continue taking your Xarelto 10mg  strength tablet by mouth, once-daily.

## 2021-10-11 NOTE — Progress Notes (Deleted)
Patient has transitioned onto rivaroxaban/Xarelto 10mg  PO once-daily for secondary prophylaxis against recurrence of DVT. No subsequent visits to the anticoagulation management clinic are required. We did discuss signs and symptoms that could suggest DVT/PE recurrence as well as signs and symptoms of bleeding.

## 2021-10-11 NOTE — Progress Notes (Signed)
Patient has been transitioned off of warfarin onto Xarelto 10mg  PO once-daily by her PCP and with shared decision making on the part of the patient and physician. She reports daily compliance with her Xarelto 10mg  and denies any symptoms or signs of recurrence of VTE (which were reinforced by teaching) and on signs or symptoms of bleeding. Patient additionally asked for and was provided her annual influenza vaccine at the age appropriate dose.

## 2021-10-12 DIAGNOSIS — N179 Acute kidney failure, unspecified: Secondary | ICD-10-CM | POA: Diagnosis not present

## 2021-10-12 DIAGNOSIS — D509 Iron deficiency anemia, unspecified: Secondary | ICD-10-CM | POA: Diagnosis not present

## 2021-10-12 DIAGNOSIS — I5042 Chronic combined systolic (congestive) and diastolic (congestive) heart failure: Secondary | ICD-10-CM | POA: Diagnosis not present

## 2021-10-12 DIAGNOSIS — N390 Urinary tract infection, site not specified: Secondary | ICD-10-CM | POA: Diagnosis not present

## 2021-10-12 DIAGNOSIS — I110 Hypertensive heart disease with heart failure: Secondary | ICD-10-CM | POA: Diagnosis not present

## 2021-10-12 NOTE — Telephone Encounter (Signed)
Call to patient to ask about need for Zofran. Patient stated has nausea occassionally.  None presently.  Just wanted to have a few on hand.  Has a couple left that she will take if needed.  Does not need a refill now

## 2021-10-13 LAB — HM DIABETES EYE EXAM

## 2021-10-13 NOTE — Telephone Encounter (Signed)
Next appt scheduled 12/14/21 with PCP. 

## 2021-10-15 NOTE — Progress Notes (Unsigned)
This encounter was created in error - please disregard.  This encounter was created in error - please disregard.

## 2021-10-19 ENCOUNTER — Ambulatory Visit (INDEPENDENT_AMBULATORY_CARE_PROVIDER_SITE_OTHER): Payer: Medicare Other | Admitting: Internal Medicine

## 2021-10-19 ENCOUNTER — Encounter: Payer: Self-pay | Admitting: Dietician

## 2021-10-19 DIAGNOSIS — N179 Acute kidney failure, unspecified: Secondary | ICD-10-CM | POA: Diagnosis not present

## 2021-10-19 DIAGNOSIS — I5042 Chronic combined systolic (congestive) and diastolic (congestive) heart failure: Secondary | ICD-10-CM | POA: Diagnosis not present

## 2021-10-19 DIAGNOSIS — J019 Acute sinusitis, unspecified: Secondary | ICD-10-CM | POA: Diagnosis not present

## 2021-10-19 DIAGNOSIS — D509 Iron deficiency anemia, unspecified: Secondary | ICD-10-CM | POA: Diagnosis not present

## 2021-10-19 DIAGNOSIS — I110 Hypertensive heart disease with heart failure: Secondary | ICD-10-CM | POA: Diagnosis not present

## 2021-10-19 DIAGNOSIS — N390 Urinary tract infection, site not specified: Secondary | ICD-10-CM | POA: Diagnosis not present

## 2021-10-19 MED ORDER — FLUTICASONE PROPIONATE 50 MCG/ACT NA SUSP: 1.0000 | Freq: Every day | NASAL | 0 refills | Status: DC

## 2021-10-19 NOTE — Progress Notes (Signed)
  [facility] Internal Medicine Residency Telephone Encounter Continuity Care Appointment  HPI:  This telephone encounter was created for [name] on 10/19/2021 for the following purpose/cc "not feeling good". Reports has been feeling poor, sneezing, clear-yellow phlegm production. Has headache. She says she feels like she has a URI. No fevers, myalgia, or sob. Has been around grandchildren who may have been sick. She is staying hydrated, wanting abx.    Past Medical History:  Past Medical History:  Diagnosis Date   Arthritis    Asthma    Bronchitis    CHF (congestive heart failure) ([place])    Diabetes ([place]) 2019   Diverticulitis    DVT, lower extremity, recurrent ([place])    Patient had unprovoked PE on 2002 and DVT in right lower extremety 2008.   Elevated INR    Frequent urination 03/21/2019   GERD (gastroesophageal reflux disease)    Hematuria, microscopic 09/13/2017   History of kidney stones    Hypertension    Lower abdominal pain 02/20/2018   PE (pulmonary embolism)    Patient had unprovoked PE on 2002   PONV (postoperative nausea and vomiting)    Vertigo      ROS:  See hpi   Assessment / Plan / Recommendations:  Please see A&P under problem oriented charting for assessment of the patient's acute and chronic medical conditions.  Suspicious for sinus infection. Will try conservative measures with sinus irrigation and intranasal fluticasone. If no improvement, can try course of abx. As always, pt is advised that if symptoms worsen or new symptoms arise, they should go to an urgent care facility or to to ER for further evaluation.   Consent and Medical Decision Making:  Patient discussed with Dr. Evette Doffing This is a telephone encounter between Caroline [name] and Caroline [name] on 10/19/2021 for sinnus infection. The visit was conducted with the patient located at home and Caroline [name] at [facility]. The patient's identity was confirmed using their DOB and current address. The  patient has consented to being evaluated through a telephone encounter and understands the associated risks (an examination cannot be done and the patient may need to come in for an appointment) / benefits (allows the patient to remain at home, decreasing exposure to coronavirus). I personally spent 15 minutes on medical discussion.

## 2021-10-23 ENCOUNTER — Encounter: Payer: Self-pay | Admitting: Internal Medicine

## 2021-10-23 DIAGNOSIS — J329 Chronic sinusitis, unspecified: Secondary | ICD-10-CM

## 2021-10-23 HISTORY — DX: Chronic sinusitis, unspecified: J32.9

## 2021-10-23 NOTE — Assessment & Plan Note (Signed)
Reports has been feeling poor, sneezing, clear-yellow phlegm production. Has headache. She says she feels like she has a URI. No fevers, myalgia, or sob. Has been around grandchildren who may have been sick. She is staying hydrated, wanting abx.   Able to speak in full sentences.   Suspicious for sinus infection. Will try conservative measures with sinus irrigation and intranasal fluticasone. If no improvement, can try course of abx.

## 2021-10-25 NOTE — Addendum Note (Signed)
Addended by: Lalla Brothers T on: 10/25/2021 08:05 AM   Modules accepted: Level of Service

## 2021-10-25 NOTE — Addendum Note (Signed)
Addended by: Stelios Kirby T on: 10/25/2021 08:05 AM   Modules accepted: Level of Service  

## 2021-10-25 NOTE — Progress Notes (Signed)
Internal Medicine Clinic Attending  Case discussed with Dr. Elliot Gurney  At the time of the visit.  We reviewed the resident's history and pertinent patient test results.  I agree with the assessment, diagnosis, and plan of care documented in the resident's note.

## 2021-11-04 ENCOUNTER — Other Ambulatory Visit: Payer: Self-pay | Admitting: Internal Medicine

## 2021-11-04 DIAGNOSIS — E118 Type 2 diabetes mellitus with unspecified complications: Secondary | ICD-10-CM

## 2021-11-09 ENCOUNTER — Ambulatory Visit
Admission: RE | Admit: 2021-11-09 | Discharge: 2021-11-09 | Disposition: A | Discharge status: Home / Self Care | Payer: Medicare Other | Source: Ambulatory Visit | Attending: Internal Medicine | Admitting: Internal Medicine

## 2021-11-09 DIAGNOSIS — Z1231 Encounter for screening mammogram for malignant neoplasm of breast: Secondary | ICD-10-CM

## 2021-11-12 ENCOUNTER — Other Ambulatory Visit: Payer: Self-pay | Admitting: Internal Medicine

## 2021-11-12 DIAGNOSIS — R928 Other abnormal and inconclusive findings on diagnostic imaging of breast: Secondary | ICD-10-CM

## 2021-11-19 ENCOUNTER — Ambulatory Visit
Admission: RE | Admit: 2021-11-19 | Discharge: 2021-11-19 | Disposition: A | Discharge status: Home / Self Care | Payer: Medicare Other | Source: Ambulatory Visit | Attending: Internal Medicine | Admitting: Internal Medicine

## 2021-11-19 ENCOUNTER — Other Ambulatory Visit: Payer: Self-pay | Admitting: Internal Medicine

## 2021-11-19 DIAGNOSIS — R921 Mammographic calcification found on diagnostic imaging of breast: Secondary | ICD-10-CM | POA: Diagnosis not present

## 2021-11-19 DIAGNOSIS — R928 Other abnormal and inconclusive findings on diagnostic imaging of breast: Secondary | ICD-10-CM

## 2021-11-20 ENCOUNTER — Other Ambulatory Visit: Payer: Self-pay | Admitting: Internal Medicine

## 2021-11-20 DIAGNOSIS — E7800 Pure hypercholesterolemia, unspecified: Secondary | ICD-10-CM

## 2021-11-20 DIAGNOSIS — R112 Nausea with vomiting, unspecified: Secondary | ICD-10-CM

## 2021-12-13 ENCOUNTER — Encounter: Payer: Self-pay | Admitting: Internal Medicine

## 2021-12-14 ENCOUNTER — Encounter: Payer: Self-pay | Admitting: Internal Medicine

## 2021-12-14 ENCOUNTER — Ambulatory Visit (INDEPENDENT_AMBULATORY_CARE_PROVIDER_SITE_OTHER): Payer: Medicare Other | Admitting: Internal Medicine

## 2021-12-14 VITALS — BP 155/68 | HR 72 | Temp 98.0°F | Ht 62.0 in | Wt 251.8 lb

## 2021-12-14 DIAGNOSIS — I110 Hypertensive heart disease with heart failure: Secondary | ICD-10-CM

## 2021-12-14 DIAGNOSIS — Z87891 Personal history of nicotine dependence: Secondary | ICD-10-CM

## 2021-12-14 DIAGNOSIS — Z7984 Long term (current) use of oral hypoglycemic drugs: Secondary | ICD-10-CM

## 2021-12-14 DIAGNOSIS — E118 Type 2 diabetes mellitus with unspecified complications: Secondary | ICD-10-CM | POA: Diagnosis not present

## 2021-12-14 DIAGNOSIS — Z6841 Body Mass Index (BMI) 40.0 and over, adult: Secondary | ICD-10-CM

## 2021-12-14 DIAGNOSIS — E782 Mixed hyperlipidemia: Secondary | ICD-10-CM | POA: Diagnosis not present

## 2021-12-14 DIAGNOSIS — R195 Other fecal abnormalities: Secondary | ICD-10-CM | POA: Diagnosis not present

## 2021-12-14 DIAGNOSIS — Z0000 Encounter for general adult medical examination without abnormal findings: Secondary | ICD-10-CM

## 2021-12-14 DIAGNOSIS — J4521 Mild intermittent asthma with (acute) exacerbation: Secondary | ICD-10-CM | POA: Diagnosis not present

## 2021-12-14 DIAGNOSIS — I82409 Acute embolism and thrombosis of unspecified deep veins of unspecified lower extremity: Secondary | ICD-10-CM

## 2021-12-14 DIAGNOSIS — J4530 Mild persistent asthma, uncomplicated: Secondary | ICD-10-CM | POA: Diagnosis not present

## 2021-12-14 DIAGNOSIS — I10 Essential (primary) hypertension: Secondary | ICD-10-CM

## 2021-12-14 DIAGNOSIS — F329 Major depressive disorder, single episode, unspecified: Secondary | ICD-10-CM

## 2021-12-14 DIAGNOSIS — I5042 Chronic combined systolic (congestive) and diastolic (congestive) heart failure: Secondary | ICD-10-CM | POA: Diagnosis not present

## 2021-12-14 LAB — POCT GLYCOSYLATED HEMOGLOBIN (HGB A1C): Hemoglobin A1C: 6.1 % — AB (ref 4.0–5.6)

## 2021-12-14 LAB — GLUCOSE, CAPILLARY: Glucose-Capillary: 143 mg/dL — ABNORMAL HIGH (ref 70–99)

## 2021-12-14 MED ORDER — LOSARTAN POTASSIUM 25 MG PO TABS: 25.0000 mg | ORAL_TABLET | Freq: Every day | ORAL | 5 refills | Status: DC

## 2021-12-14 MED ORDER — BUDESONIDE-FORMOTEROL FUMARATE 160-4.5 MCG/ACT IN AERO: INHALATION_SPRAY | RESPIRATORY_TRACT | 4 refills | Status: DC

## 2021-12-14 MED ORDER — RIVAROXABAN 10 MG PO TABS: 10.0000 mg | ORAL_TABLET | Freq: Every day | ORAL | 3 refills | Status: DC

## 2021-12-14 NOTE — Assessment & Plan Note (Signed)
-  This problem is chronic and stable -Patient has a history of recurrent DVT/PE and was on warfarin for many years. -At her last visit in August Coumadin was discontinued and she was transitioned to Xarelto.  Patient is tolerating this medication well -We will continue with Xarelto 10 mg daily -No further work-up at this time

## 2021-12-14 NOTE — Assessment & Plan Note (Signed)
-  This problem is chronic and stable -On exam today, patient had no lower extremity edema and her lungs are clear to auscultation bilaterally -Patient denies any shortness of breath -We will continue with carvedilol 12.5 mg twice daily and increase her losartan to 25 mg daily.  Patient was on losartan/HCTZ 100/12.5 mg prior to her hospitalization this year but was hypotensive on that hospitalization and was slowly titrating her medications up as an outpatient -Patient may benefit from addition of an SGLT2 inhibitor.  We will need to discuss this at her follow-up visit -No further work-up at this time

## 2021-12-14 NOTE — Progress Notes (Signed)
   Subjective:    Patient ID: Caroline [name], female    DOB: [birth year], 76 y.o.   MRN: [id number]  Diabetes  Medication Refill    I have seen and examined this patient.  Patient is here for routine follow-up of her hypertension and diabetes.  Patient states that she feels well today and denies any new complaints.  She has not started her Greggory Keen and states that she has been losing weight on her own with diet.  She states that she is compliant with all her medications.  Review of Systems  Constitutional: Negative.   HENT: Negative.    Respiratory: Negative.    Cardiovascular: Negative.   Gastrointestinal: Negative.   Musculoskeletal: Negative.   Neurological: Negative.   Psychiatric/Behavioral: Negative.         Objective:   Physical Exam Constitutional:      Appearance: Normal appearance.  HENT:     Head: Normocephalic and atraumatic.  Cardiovascular:     Rate and Rhythm: Normal rate and regular rhythm.     Heart sounds: Normal heart sounds.  Pulmonary:     Effort: Pulmonary effort is normal. No respiratory distress.     Breath sounds: Normal breath sounds. No wheezing.  Abdominal:     General: Bowel sounds are normal. There is no distension.     Palpations: Abdomen is soft.     Tenderness: There is no abdominal tenderness.  Musculoskeletal:        General: No swelling or tenderness.  Neurological:     Mental Status: She is alert and oriented to person, place, and time.  Psychiatric:        Mood and Affect: Mood normal.        Behavior: Behavior normal.           Assessment & Plan:   Please see problem based charting for assessment and plan:

## 2021-12-14 NOTE — Assessment & Plan Note (Signed)
-  Patient has refused colonoscopy in the past and continues to refuse it today -Given patient's age of 87 I do not believe that a colonoscopy would be beneficial for her at this time and patient is in agreement -We will discontinue this from her care gaps

## 2021-12-14 NOTE — Assessment & Plan Note (Signed)
-  This problem is chronic and stable -Continue with rosuvastatin 20 mg daily -We will check a lipid panel at her next visit

## 2021-12-14 NOTE — Patient Instructions (Signed)
-  It was a pleasure seeing you today and taking care of you all these years. -The blood pressure was elevated today.  We will recheck this and if it remains high we will increase her losartan to 25 mg daily -Please continue with your Xarelto as a blood thinner -Diabetes is well controlled.  Keep up the great work!  Continue with the metformin twice daily -I have refilled her Symbicort for you.  Please pick this up at the pharmacy -I do not think that you need continued screening with colonoscopies at this time.   -Please call me if you have any questions or concerns or if you need any refills

## 2021-12-14 NOTE — Assessment & Plan Note (Signed)
-  This problem is chronic and stable -Patient states that her symptoms are well controlled Symbicort and Singulair and she uses albuterol rarely -I have put in a refill for Symbicort today -On exam, her lungs are clear to auscultation bilaterally -No further work-up at this time

## 2021-12-14 NOTE — Assessment & Plan Note (Signed)
-  Patient with history of positive fecal immunochemical test but has not come through with colonoscopy in the last year and a half and does not want to do colonoscopy at this time -Given her age and reluctance to do colonoscopy we will discontinue this from her care gaps

## 2021-12-14 NOTE — Assessment & Plan Note (Signed)
Lab Results  Component Value Date   HGBA1C 6.1 (A) 12/14/2021   HGBA1C 6.1 (A) 08/30/2021   HGBA1C 6.9 (A) 05/18/2021     Assessment: Diabetes control:  Well-controlled Progress toward A1C goal:   At goal Comments: Patient is compliant with metformin 500 mg twice daily.  Caroline [name] did not pick up [facility] and states that Caroline [name] would like to continue with metformin only for now  Plan: Medications:  continue current medications Home glucose monitoring: Frequency:   Timing:   Instruction/counseling given: discussed the need for weight loss Educational resources provided:   Self management tools provided:   Other plans: Patient may benefit from addition of SGLT2 inhibitor given her history of chronic combined heart failure.  We will need to discuss this with her at her next visit

## 2021-12-14 NOTE — Assessment & Plan Note (Signed)
-  This problem is chronic and improving -Patient states that she has been watching her diet and has lost approximately 17 pounds since her last visit -She will continue with diet and exercise at home -No further work-up at this time

## 2021-12-14 NOTE — Assessment & Plan Note (Signed)
BP Readings from Last 3 Encounters:  12/14/21 (!) 154/84  08/30/21 (!) 110/58  08/25/21 (!) 142/69    Lab Results  Component Value Date   NA 140 08/25/2021   K 3.5 08/25/2021   CREATININE 0.74 08/25/2021    Assessment: Blood pressure control:  Uncontrolled Progress toward BP goal:   Deteriorated Comments: Patient is compliant with losartan 12.5 mg daily as well as carvedilol 12.5 mg twice daily.  Plan: Medications:   We will increase losartan to 25 mg daily and have patient follow-up in 4 weeks for repeat BMP and blood pressure check Educational resources provided:   Self management tools provided:   Other plans: Patient will need repeat BMP and blood pressure check in 4 weeks.  Patient was on losartan/HCTZ 100/12.5 mg prior to hospitalization where she was noted to be hypotensive but her blood pressures have appeared to have improved now and she is now hypertensive and may need titration up on her blood pressure medications

## 2021-12-14 NOTE — Assessment & Plan Note (Signed)
-  This problem is chronic and worsening -Patient states that her mood is doing well but her PHQ-9 score was elevated at 9 today -We will need to discuss possibly restarting medication with her at her next visit

## 2021-12-29 ENCOUNTER — Encounter: Payer: Self-pay | Admitting: *Deleted

## 2022-01-02 ENCOUNTER — Other Ambulatory Visit: Payer: Self-pay | Admitting: Internal Medicine

## 2022-01-02 DIAGNOSIS — R42 Dizziness and giddiness: Secondary | ICD-10-CM

## 2022-01-13 ENCOUNTER — Other Ambulatory Visit: Payer: Self-pay | Admitting: Internal Medicine

## 2022-01-13 DIAGNOSIS — K219 Gastro-esophageal reflux disease without esophagitis: Secondary | ICD-10-CM

## 2022-01-14 ENCOUNTER — Encounter: Payer: Self-pay | Admitting: Student

## 2022-01-14 ENCOUNTER — Ambulatory Visit (INDEPENDENT_AMBULATORY_CARE_PROVIDER_SITE_OTHER): Payer: Medicare Other | Admitting: Student

## 2022-01-14 ENCOUNTER — Other Ambulatory Visit: Payer: Self-pay

## 2022-01-14 VITALS — BP 146/70 | HR 67 | Temp 98.2°F | Ht 62.0 in | Wt 252.2 lb

## 2022-01-14 DIAGNOSIS — Z7901 Long term (current) use of anticoagulants: Secondary | ICD-10-CM | POA: Diagnosis not present

## 2022-01-14 DIAGNOSIS — I82409 Acute embolism and thrombosis of unspecified deep veins of unspecified lower extremity: Secondary | ICD-10-CM | POA: Diagnosis not present

## 2022-01-14 DIAGNOSIS — Z7984 Long term (current) use of oral hypoglycemic drugs: Secondary | ICD-10-CM

## 2022-01-14 DIAGNOSIS — E118 Type 2 diabetes mellitus with unspecified complications: Secondary | ICD-10-CM

## 2022-01-14 DIAGNOSIS — Z87891 Personal history of nicotine dependence: Secondary | ICD-10-CM | POA: Diagnosis not present

## 2022-01-14 DIAGNOSIS — J4530 Mild persistent asthma, uncomplicated: Secondary | ICD-10-CM | POA: Diagnosis not present

## 2022-01-14 DIAGNOSIS — I10 Essential (primary) hypertension: Secondary | ICD-10-CM

## 2022-01-14 DIAGNOSIS — J4521 Mild intermittent asthma with (acute) exacerbation: Secondary | ICD-10-CM | POA: Diagnosis not present

## 2022-01-14 DIAGNOSIS — Z0000 Encounter for general adult medical examination without abnormal findings: Secondary | ICD-10-CM

## 2022-01-14 MED ORDER — RIVAROXABAN 10 MG PO TABS: 10.0000 mg | ORAL_TABLET | Freq: Every day | ORAL | 3 refills | Status: DC

## 2022-01-14 MED ORDER — ALBUTEROL SULFATE HFA 108 (90 BASE) MCG/ACT IN AERS: INHALATION_SPRAY | RESPIRATORY_TRACT | 2 refills | Status: DC

## 2022-01-14 MED ORDER — HYDROCHLOROTHIAZIDE 12.5 MG PO TABS: 12.5000 mg | ORAL_TABLET | Freq: Every day | ORAL | 0 refills | Status: DC

## 2022-01-14 MED ORDER — METFORMIN HCL ER 500 MG PO TB24: 500.0000 mg | ORAL_TABLET | Freq: Two times a day (BID) | ORAL | 1 refills | Status: DC

## 2022-01-14 MED ORDER — MONTELUKAST SODIUM 10 MG PO TABS: ORAL_TABLET | ORAL | 3 refills | Status: DC

## 2022-01-14 MED ORDER — LOSARTAN POTASSIUM 25 MG PO TABS: 25.0000 mg | ORAL_TABLET | Freq: Every day | ORAL | 5 refills | Status: DC

## 2022-01-14 MED ORDER — BUDESONIDE-FORMOTEROL FUMARATE 160-4.5 MCG/ACT IN AERO: INHALATION_SPRAY | RESPIRATORY_TRACT | 4 refills | Status: DC

## 2022-01-14 MED ORDER — CARVEDILOL 12.5 MG PO TABS: 12.5000 mg | ORAL_TABLET | Freq: Two times a day (BID) | ORAL | 5 refills | Status: DC

## 2022-01-14 NOTE — Assessment & Plan Note (Signed)
History of recurrent DVT/PE, was switched to xarelto from warfarin over the last year and tolerating well.

## 2022-01-14 NOTE — Assessment & Plan Note (Signed)
Doing well on metformin 500 mg BID, last A1c of 6.1% last month. -continue metformin 500 mg BID and check A1c in 2 months

## 2022-01-14 NOTE — Assessment & Plan Note (Signed)
Patient is here to follow up for blood pressure management. Her blood pressure medications were discontinued after a hospitalization where she was having episodes of hypotension. Since then, she has had losartan restarted at 25 mg daily. Her BP's today are 017-494'W systolic, she does not have a cuff at home to check BP's. Will plan to add HCTZ 12.5 mg to her regimen today and have her follow up for BP recheck and BMP in 1 month -continue losartan 25 mg daily and start HCTZ 12.5 mg daily -return in 1 month for BP check and BMP

## 2022-01-14 NOTE — Patient Instructions (Signed)
Thank you, Ms.Yarima N Liberto for allowing [place] to provide your care today. Today we discussed .    High blood pressure We will be restarting you on hydrochlorothiazide, please take this and your losartan  I have ordered the following labs for you:  Lab Orders  No laboratory test(s) ordered today     Referrals ordered today:   Referral Orders  No referral(s) requested today     I have ordered the following medication/changed the following medications:   Stop the following medications: Medications Discontinued During This Encounter  Medication Reason   PROAIR HFA 108 (90 Base) MCG/ACT inhaler Reorder   montelukast (SINGULAIR) 10 MG tablet Reorder   carvedilol (COREG) 12.5 MG tablet Reorder   metFORMIN (GLUCOPHAGE-XR) 500 MG 24 hr tablet Reorder   budesonide-formoterol (SYMBICORT) 160-4.5 MCG/ACT inhaler Reorder   losartan (COZAAR) 25 MG tablet Reorder   rivaroxaban (XARELTO) 10 MG TABS tablet Reorder     Start the following medications: Meds ordered this encounter  Medications   hydrochlorothiazide (HYDRODIURIL) 12.5 MG tablet    Sig: Take 1 tablet (12.5 mg total) by mouth daily.    Dispense:  30 tablet    Refill:  0   budesonide-formoterol (SYMBICORT) 160-4.5 MCG/ACT inhaler    Sig: TAKE 2 PUFFS BY MOUTH TWICE A DAY    Dispense:  30.6 each    Refill:  4   carvedilol (COREG) 12.5 MG tablet    Sig: Take 1 tablet (12.5 mg total) by mouth 2 (two) times daily with a meal.    Dispense:  60 tablet    Refill:  5   losartan (COZAAR) 25 MG tablet    Sig: Take 1 tablet (25 mg total) by mouth daily.    Dispense:  30 tablet    Refill:  5   rivaroxaban (XARELTO) 10 MG TABS tablet    Sig: Take 1 tablet (10 mg total) by mouth daily.    Dispense:  90 tablet    Refill:  3   albuterol (PROAIR HFA) 108 (90 Base) MCG/ACT inhaler    Sig: TAKE 2 PUFFS BY MOUTH EVERY 6 HOURS AS NEEDED FOR WHEEZE OR SHORTNESS OF BREATH    Dispense:  8.5 each    Refill:  2   metFORMIN (GLUCOPHAGE-XR) 500  MG 24 hr tablet    Sig: Take 1 tablet (500 mg total) by mouth 2 (two) times daily with a meal.    Dispense:  180 tablet    Refill:  1   montelukast (SINGULAIR) 10 MG tablet    Sig: TAKE 1 TABLET BY MOUTH EVERYDAY AT BEDTIME    Dispense:  90 tablet    Refill:  3     Follow up: 1 month follow up    Should you have any questions or concerns please call the internal medicine clinic at [phone].    Sanjuana Letters, D.O. [place]

## 2022-01-14 NOTE — Assessment & Plan Note (Signed)
She is not interested in the shingles vaccine, encouraged her to reach out if she changes her mind or has further questions regarding this

## 2022-01-14 NOTE — Progress Notes (Signed)
CC: follow up hypertension  HPI:  Caroline [name] is a 77 y.o. female living with a history stated below and presents today for hypertension management follow up. Please see problem based assessment and plan for additional details.  Past Medical History:  Diagnosis Date   Anticoagulated on warfarin    Arthritis    Asthma    Bronchitis    CHF (congestive heart failure) ([place])    Diabetes ([place]) 2019   Diverticulitis    DVT, lower extremity, recurrent ([place])    Patient had unprovoked PE on 2002 and DVT in right lower extremety 2008.   Elevated INR    Frequent urination 03/21/2019   GERD (gastroesophageal reflux disease)    Hematuria, microscopic 09/13/2017   History of kidney stones    Hypertension    Hypokalemia 05/01/2017   Lower abdominal pain 02/20/2018   PE (pulmonary embolism)    Patient had unprovoked PE on 2002   PONV (postoperative nausea and vomiting)    Vertigo     Current Outpatient Medications on File Prior to Visit  Medication Sig Dispense Refill   acetaminophen (TYLENOL) 500 MG tablet Take 500 mg by mouth 4 (four) times daily as needed for mild pain or headache.     cholecalciferol (VITAMIN D3) 25 MCG (1000 UNIT) tablet TAKE 1 TABLET BY MOUTH EVERY DAY 90 tablet 1   diclofenac Sodium (VOLTAREN) 1 % GEL Apply 4 g topically 4 (four) times daily. 4 g 0   fluticasone (FLONASE) 50 MCG/ACT nasal spray Place 1 spray into both nostrils daily. 16 g 0   hydrocortisone (PROCTOSOL HC) 2.5 % rectal cream Place 1 application rectally 2 (two) times daily. (Patient taking differently: Place 1 application  rectally 2 (two) times daily as needed for hemorrhoids.) 30 g 0   meclizine (ANTIVERT) 25 MG tablet TAKE 1 TABLET BY MOUTH THREE TIMES A DAY AS NEEDED 30 tablet 0   methocarbamol (ROBAXIN) 500 MG tablet TAKE 1 TABLET (500 MG TOTAL) BY MOUTH 2 (TWO) TIMES DAILY AS NEEDED FOR MUSCLE SPASMS. 10 tablet 0   ondansetron (ZOFRAN) 4 MG tablet TAKE 1 TABLET BY MOUTH DAILY AS NEEDED FOR  NAUSEA OR VOMITING 30 tablet 0   pantoprazole (PROTONIX) 40 MG tablet TAKE 1 TABLET BY MOUTH EVERY DAY 90 tablet 1   Psyllium (METAMUCIL PO) Take by mouth at bedtime. gummies     RESTASIS 0.05 % ophthalmic emulsion INSTILL 1 DROP INTO BOTH EYES TWICE A DAY 60 mL 1   rosuvastatin (CRESTOR) 20 MG tablet TAKE 1 TABLET BY MOUTH EVERY DAY 90 tablet 3   No current facility-administered medications on file prior to visit.    Family History  Problem Relation Age of Onset   Cancer Mother    Hypertension Sister    Diabetes Sister    Hypertension Brother    Diabetes Brother    Hypertension Sister    Diabetes Sister    Colon cancer Other    Heart Problems Other 100       sister's child, open heart surgery   CVA Father    Diabetes Son    Hypertension Son    Kidney disease Son        on dialysis   Hypertension Sister    Diabetes Sister    Hypertension Sister    Diabetes Sister    Cancer Brother    Hypertension Son    Diabetes Son    Hypertension Daughter    Schizophrenia Daughter  Social History   Socioeconomic History   Marital status: Divorced    Spouse name: Not on file   Number of children: 5   Years of education: 9 th grade   Highest education level: 9th grade  Occupational History   Occupation: retired  Tobacco Use   Smoking status: Former    Types: Cigarettes    Quit date: 09/18/1988    Years since quitting: 33.3   Smokeless tobacco: Never   Tobacco comments:    6 pack year smoking history as a teen  Vaping Use   Vaping Use: Never used  Substance and Sexual Activity   Alcohol use: No   Drug use: No   Sexual activity: Not Currently    Birth control/protection: None  Other Topics Concern   Not on file  Social History Narrative   Current Social History 08/28/2019   Patient lives with 96 yo granddaughter in a one story home. There are not steps up to the entrance the patient uses.       Patient's method of transportation is church member.      The highest  level of education was 9th grade      The patient currently retired.      Identified important Relationships are God, family       Pets : 1 lab/pitt mix named Cinnamon       Interests / Fun: Church,TV       Current Stressors: "Me and my granddaughter"       Religious / Personal Beliefs: I'm Holiness and I love people"       Other: "I'd give away my last dime."       L. [place], BSN, RN-BC    Social Determinants of Health   Financial Resource Strain: Medium Risk (05/17/2017)   Overall Financial Resource Strain (CARDIA)    Difficulty of Paying Living Expenses: Somewhat hard  Food Insecurity: No Food Insecurity (01/14/2022)   Hunger Vital Sign    Worried About Running Out of Food in the Last Year: Never true    [place] in the Last Year: Never true  Transportation Needs: No Transportation Needs (01/14/2022)   PRAPARE - Hydrologist (Medical): No    Lack of Transportation (Non-Medical): No  Physical Activity: Insufficiently Active (05/17/2017)   Exercise Vital Sign    Days of Exercise per Week: 2 days    Minutes of Exercise per Session: 20 min  Stress: Stress Concern Present (05/17/2017)   Ray    Feeling of Stress : To some extent  Social Connections: Moderately Isolated (01/14/2022)   Social Connection and Isolation Panel [NHANES]    Frequency of Communication with Friends and Family: More than three times a week    Frequency of Social Gatherings with Friends and Family: More than three times a week    Attends Religious Services: More than 4 times per year    Active Member of [employer] or Organizations: No    Attends Archivist Meetings: Never    Marital Status: Divorced  Human resources officer Violence: Not At Risk (01/14/2022)   Humiliation, Afraid, Rape, and Kick questionnaire    Fear of Current or Ex-Partner: No    Emotionally Abused: No    Physically Abused: No     Sexually Abused: No    Review of Systems: ROS negative except for what is noted on the assessment and plan.  Vitals:  01/14/22 1118 01/14/22 1129  BP: (!) 152/80 (!) 146/70  Pulse: 71 67  Temp: 98.2 F (36.8 C)   TempSrc: Oral   SpO2: 97%   Weight: 252 lb 3.2 oz (114.4 kg)   Height: 5\' 2"  (1.575 m)     Physical Exam: Constitutional: in no acute distress HENT: normocephalic atraumatic Eyes: conjunctiva non-erythematous Neck: supple Cardiovascular: regular rate and rhythm, no m/r/g Pulmonary/Chest: normal work of breathing on room air Neurological: alert & oriented x 3 Skin: warm and dry Psych: normal mood  Assessment & Plan:   DVT, lower extremity, recurrent (HCC) History of recurrent DVT/PE, was switched to xarelto from warfarin over the last year and tolerating well.   Essential hypertension Patient is here to follow up for blood pressure management. Her blood pressure medications were discontinued after a hospitalization where she was having episodes of hypotension. Since then, she has had losartan restarted at 25 mg daily. Her BP's today are 140-150's systolic, she does not have a cuff at home to check BP's. Will plan to add HCTZ 12.5 mg to her regimen today and have her follow up for BP recheck and BMP in 1 month -continue losartan 25 mg daily and start HCTZ 12.5 mg daily -return in 1 month for BP check and BMP  Type 2 diabetes mellitus with complication, without long-term current use of insulin (HCC) Doing well on metformin 500 mg BID, last A1c of 6.1% last month. -continue metformin 500 mg BID and check A1c in 2 months  Healthcare maintenance She is not interested in the shingles vaccine, encouraged her to reach out if she changes her mind or has further questions regarding this  Patient discussed with Dr. , D.O. [facility] Internal Medicine, PGY-3 Phone: [phone] Date 01/14/2022 Time 4:00 PM

## 2022-01-15 ENCOUNTER — Other Ambulatory Visit: Payer: Self-pay | Admitting: Internal Medicine

## 2022-01-15 DIAGNOSIS — K649 Unspecified hemorrhoids: Secondary | ICD-10-CM

## 2022-01-20 NOTE — Addendum Note (Signed)
Addended by: Aldine Contes on: 01/20/2022 02:05 PM   Modules accepted: Level of Service

## 2022-01-20 NOTE — Progress Notes (Signed)
Internal Medicine Clinic Attending  Case discussed with Dr. Katsadouros  At the time of the visit.  We reviewed the resident's history and exam and pertinent patient test results.  I agree with the assessment, diagnosis, and plan of care documented in the resident's note.  

## 2022-02-05 ENCOUNTER — Other Ambulatory Visit: Payer: Self-pay | Admitting: Student

## 2022-02-11 ENCOUNTER — Other Ambulatory Visit: Payer: Self-pay | Admitting: Internal Medicine

## 2022-02-11 DIAGNOSIS — K649 Unspecified hemorrhoids: Secondary | ICD-10-CM

## 2022-02-11 DIAGNOSIS — R42 Dizziness and giddiness: Secondary | ICD-10-CM

## 2022-02-11 DIAGNOSIS — R112 Nausea with vomiting, unspecified: Secondary | ICD-10-CM

## 2022-03-22 NOTE — Progress Notes (Unsigned)
[facility]: Clinic Note  Subjective:  History of Present Illness: Caroline [name] is a 77 y.o. year old female who presents for follow up of her chronic medical conditions. She was Dr. Wilber Bihari patient, I'll be her new PCP, and this is our first visit. She was last seen by Dr. Raliegh Ip on 01/14/22.  She feels great today, she has no questions or concerns. She's here with her granddaughter, and singing Dr. Wilber Bihari praises.     Please refer to Assessment and Plan below for full details in Problem-Based Charting.   Past Medical History:  Patient Active Problem List   Diagnosis Date Noted   Sinus infection 10/23/2021   Fall 08/30/2021   Positive fecal immunochemical test 07/28/2020   Diverticulitis 12/20/2018   GERD (gastroesophageal reflux disease) 01/30/2018   Hx of bee sting allergy 10/31/2017   Depression 10/31/2017   Asthma, chronic, mild persistent, uncomplicated [id number]   Type 2 diabetes mellitus with complication, without long-term current use of insulin ([place]) 06/02/2017   History of Clostridium difficile colitis 12/31/2016   Orthostatic hypotension    Lipodermatosclerosis 10/11/2016   Morbid obesity with BMI of 50.0-59.9, adult ([place]) 05/02/2016   Chronic combined systolic and diastolic CHF, NYHA class 1 ([place]) 09/21/2015   DVT, lower extremity, recurrent ([place])    Vertigo 09/13/2011   Healthcare maintenance 09/13/2011   Allergic rhinitis 06/21/2006   Hyperlipidemia 06/10/2006   Essential hypertension 06/10/2006     Medications:  Current Outpatient Medications:    acetaminophen (TYLENOL) 500 MG tablet, Take 500 mg by mouth 4 (four) times daily as needed for mild pain or headache., Disp: , Rfl:    albuterol (PROAIR HFA) 108 (90 Base) MCG/ACT inhaler, TAKE 2 PUFFS BY MOUTH EVERY 6 HOURS AS NEEDED FOR WHEEZE OR SHORTNESS OF BREATH, Disp: 8.5 each, Rfl: 2   budesonide-formoterol (SYMBICORT) 160-4.5 MCG/ACT inhaler, TAKE 2 PUFFS BY MOUTH TWICE A DAY, Disp:  30.6 each, Rfl: 4   carvedilol (COREG) 12.5 MG tablet, Take 1 tablet (12.5 mg total) by mouth 2 (two) times daily with a meal., Disp: 60 tablet, Rfl: 5   cholecalciferol (VITAMIN D3) 25 MCG (1000 UNIT) tablet, TAKE 1 TABLET BY MOUTH EVERY DAY, Disp: 90 tablet, Rfl: 1   guaiFENesin (MUCINEX) 600 MG 12 hr tablet, Take 1 tablet (600 mg total) by mouth 2 (two) times daily as needed for to loosen phlegm., Disp: 60 tablet, Rfl: 0   hydrochlorothiazide (HYDRODIURIL) 12.5 MG tablet, TAKE 1 TABLET BY MOUTH EVERY DAY, Disp: 90 tablet, Rfl: 1   hydrocortisone (ANUSOL-HC) 2.5 % rectal cream, PLACE 1 APPLICATION RECTALLY 2 (TWO) TIMES DAILY., Disp: 30 g, Rfl: 0   losartan (COZAAR) 25 MG tablet, Take 1 tablet (25 mg total) by mouth daily., Disp: 30 tablet, Rfl: 5   meclizine (ANTIVERT) 25 MG tablet, TAKE 1 TABLET BY MOUTH THREE TIMES A DAY AS NEEDED, Disp: 90 tablet, Rfl: 5   metFORMIN (GLUCOPHAGE-XR) 500 MG 24 hr tablet, Take 1 tablet (500 mg total) by mouth 2 (two) times daily with a meal., Disp: 180 tablet, Rfl: 1   montelukast (SINGULAIR) 10 MG tablet, TAKE 1 TABLET BY MOUTH EVERYDAY AT BEDTIME, Disp: 90 tablet, Rfl: 3   pantoprazole (PROTONIX) 40 MG tablet, TAKE 1 TABLET BY MOUTH EVERY DAY, Disp: 90 tablet, Rfl: 1   Psyllium (METAMUCIL PO), Take by mouth at bedtime. gummies, Disp: , Rfl:    RESTASIS 0.05 % ophthalmic emulsion, INSTILL 1 DROP INTO BOTH EYES TWICE A DAY,  Disp: 60 mL, Rfl: 1   rivaroxaban (XARELTO) 10 MG TABS tablet, Take 1 tablet (10 mg total) by mouth daily., Disp: 90 tablet, Rfl: 3   rosuvastatin (CRESTOR) 20 MG tablet, TAKE 1 TABLET BY MOUTH EVERY DAY, Disp: 90 tablet, Rfl: 3   diclofenac Sodium (VOLTAREN) 1 % GEL, Apply 4 g topically 4 (four) times daily., Disp: 4 g, Rfl: 3   fluticasone (FLONASE) 50 MCG/ACT nasal spray, Place 1 spray into both nostrils daily., Disp: 16 g, Rfl: 0   Allergies: Allergies  Allergen Reactions   Morphine Sulfate Shortness Of Breath   Penicillins  Anaphylaxis, Hives, Swelling and Rash    Has patient had a PCN reaction causing immediate rash, facial/tongue/throat swelling, SOB or lightheadedness with hypotension: Yes Has patient had a PCN reaction causing severe rash involving mucus membranes or skin necrosis: Yes Has patient had a PCN reaction that required hospitalization Yes Has patient had a PCN reaction occurring within the last 10 years: No If all of the above answers are "NO", then may proceed with Cephalosporin use.    Cabbage Itching   Keflex [Cephalexin] Hives    And feeling of throat tightness   Other Itching    Lettuce - itching   Shellfish Allergy Swelling   Tomato Swelling   Latex Itching and Rash     Objective:   Vitals: Vitals:   03/23/22 0957 03/23/22 1126  BP: (!) 148/63 (!) 148/69  Pulse: 70 70  Temp: 98.4 F (36.9 C)   SpO2: 100%       Physical Exam: Physical Exam Constitutional:      Comments: Obese woman, sitting in wheelchair, in no acute distress  Cardiovascular:     Rate and Rhythm: Normal rate and regular rhythm.     Heart sounds: Normal heart sounds.  Pulmonary:     Effort: Pulmonary effort is normal.     Breath sounds: Normal breath sounds. No wheezing or rhonchi.  Musculoskeletal:     Right lower leg: No edema.     Left lower leg: No edema.      Data: Labs, imaging, and micro were reviewed in Epic. Refer to Assessment and Plan below for full details in Problem-Based Charting.  Assessment & Plan:  Type 2 diabetes mellitus with complication, without long-term current use of insulin (HCC) - A1C today is 6.5, at goal of <7 - Continue metformin '500mg'$  BID - Foot exam done today with stockings on, per patient preference. She has 2+ DP pulses bilaterally and her monofilament testing was normal. She appears to have thick toenails, but she is able to cut these herself or have a friend do it for her.  - Check urine microalbumin today   Allergic rhinitis - Patient's allergies have been  acting up more with the change of seasons. She has some nasal congestion and cough, improved with albuterol inhaler, which she used 3x this week.  - Refill flonase - Mucinex as needed for any secretions that come up. After discussing this today, she asked that I send to her pharmacy so she knows which one to pick up, and I did this.   Essential hypertension - Blood pressures 140s/60s x2 today.  - Dr. Raliegh Ip had added HCTZ 12.'5mg'$  daily at last visit. We will check BMP today.  - if +urine microalbumin, I will increase Losartan '25mg'$  daily to 50. If not, I probably won't make any changes to her regimen - I don't want to drop the diastolic BP too low in her  DVT, lower extremity, recurrent (HCC) - No recent leg swelling or shortness of breath or chest pain - No bruising or bleeding  - Continue Xarelto '10mg'$  daily, I expect this will be a lifelong med      Patient will follow up in 4 months, and sooner if I adjust her BP meds.   Lottie Mussel, MD

## 2022-03-23 ENCOUNTER — Encounter: Payer: Self-pay | Admitting: Internal Medicine

## 2022-03-23 ENCOUNTER — Ambulatory Visit (INDEPENDENT_AMBULATORY_CARE_PROVIDER_SITE_OTHER): Payer: 59 | Admitting: Internal Medicine

## 2022-03-23 VITALS — BP 148/69 | HR 70 | Temp 98.4°F | Ht 62.0 in | Wt 254.4 lb

## 2022-03-23 DIAGNOSIS — I10 Essential (primary) hypertension: Secondary | ICD-10-CM

## 2022-03-23 DIAGNOSIS — J4521 Mild intermittent asthma with (acute) exacerbation: Secondary | ICD-10-CM | POA: Diagnosis not present

## 2022-03-23 DIAGNOSIS — Z7901 Long term (current) use of anticoagulants: Secondary | ICD-10-CM

## 2022-03-23 DIAGNOSIS — I82409 Acute embolism and thrombosis of unspecified deep veins of unspecified lower extremity: Secondary | ICD-10-CM | POA: Diagnosis not present

## 2022-03-23 DIAGNOSIS — J301 Allergic rhinitis due to pollen: Secondary | ICD-10-CM | POA: Diagnosis not present

## 2022-03-23 DIAGNOSIS — Z87891 Personal history of nicotine dependence: Secondary | ICD-10-CM

## 2022-03-23 DIAGNOSIS — E7800 Pure hypercholesterolemia, unspecified: Secondary | ICD-10-CM

## 2022-03-23 DIAGNOSIS — Z7984 Long term (current) use of oral hypoglycemic drugs: Secondary | ICD-10-CM | POA: Diagnosis not present

## 2022-03-23 DIAGNOSIS — K219 Gastro-esophageal reflux disease without esophagitis: Secondary | ICD-10-CM

## 2022-03-23 DIAGNOSIS — E118 Type 2 diabetes mellitus with unspecified complications: Secondary | ICD-10-CM

## 2022-03-23 LAB — POCT GLYCOSYLATED HEMOGLOBIN (HGB A1C): Hemoglobin A1C: 6.5 % — AB (ref 4.0–5.6)

## 2022-03-23 LAB — GLUCOSE, CAPILLARY: Glucose-Capillary: 130 mg/dL — ABNORMAL HIGH (ref 70–99)

## 2022-03-23 MED ORDER — FLUTICASONE PROPIONATE 50 MCG/ACT NA SUSP: 1.0000 | Freq: Every day | NASAL | 0 refills | Status: DC

## 2022-03-23 MED ORDER — DICLOFENAC SODIUM 1 % EX GEL: 4.0000 g | Freq: Four times a day (QID) | CUTANEOUS | 3 refills | Status: DC

## 2022-03-23 MED ORDER — GUAIFENESIN ER 600 MG PO TB12: 600.0000 mg | ORAL_TABLET | Freq: Two times a day (BID) | ORAL | 0 refills | Status: DC | PRN

## 2022-03-23 NOTE — Assessment & Plan Note (Signed)
-   Patient's allergies have been acting up more with the change of seasons. She has some nasal congestion and cough, improved with albuterol inhaler, which she used 3x this week.  - Refill flonase - Mucinex as needed for any secretions that come up. After discussing this today, she asked that I send to her pharmacy so she knows which one to pick up, and I did this.

## 2022-03-23 NOTE — Assessment & Plan Note (Signed)
-   Blood pressures 140s/60s x2 today.  - Dr. Raliegh Ip had added HCTZ 12.5mg  daily at last visit. We will check BMP today.  - if +urine microalbumin, I will increase Losartan 25mg  daily to 50. If not, I probably won't make any changes to her regimen - I don't want to drop the diastolic BP too low in her

## 2022-03-23 NOTE — Patient Instructions (Addendum)
Dear Caroline [name],  It was a pleasure seeing you in clinic today.  You are doing great!  For your seasonal allergies, I have sent in more Flonase nasal spray to your pharmacy. I have also sent a medicine to help you loosen your secretions if you have any - this is an over-the-counter medicine called guaifenesin or mucinex.   I will call you next week with the results of your lab tests.   We'll plan to see you back in 4 months, and please call us if you need anything sooner.   Sincerely, Dr. Lottie Mussel

## 2022-03-23 NOTE — Assessment & Plan Note (Addendum)
-   A1C today is 6.5, at goal of <7 - Continue metformin 500mg  BID - Foot exam done today with stockings on, per patient preference. She has 2+ DP pulses bilaterally and her monofilament testing was normal. She appears to have thick toenails, but she is able to cut these herself or have a friend do it for her.  - Check urine microalbumin today

## 2022-03-23 NOTE — Assessment & Plan Note (Signed)
-   No recent leg swelling or shortness of breath or chest pain - No bruising or bleeding  - Continue Xarelto 10mg  daily, I expect this will be a lifelong med

## 2022-03-24 LAB — MICROALBUMIN / CREATININE URINE RATIO
Creatinine, Urine: 110.2 mg/dL
Microalb/Creat Ratio: 121 mg/g creat — ABNORMAL HIGH (ref 0–29)
Microalbumin, Urine: 133.1 ug/mL

## 2022-03-24 LAB — BMP8+ANION GAP
Anion Gap: 13 mmol/L (ref 10.0–18.0)
BUN/Creatinine Ratio: 20 (ref 12–28)
BUN: 16 mg/dL (ref 8–27)
CO2: 28 mmol/L (ref 20–29)
Calcium: 9.8 mg/dL (ref 8.7–10.3)
Chloride: 99 mmol/L (ref 96–106)
Creatinine, Ser: 0.82 mg/dL (ref 0.57–1.00)
Glucose: 127 mg/dL — ABNORMAL HIGH (ref 70–99)
Potassium: 4.1 mmol/L (ref 3.5–5.2)
Sodium: 140 mmol/L (ref 134–144)
eGFR: 75 mL/min/{1.73_m2} (ref >59)

## 2022-03-29 ENCOUNTER — Other Ambulatory Visit: Payer: Self-pay | Admitting: Internal Medicine

## 2022-03-29 DIAGNOSIS — J301 Allergic rhinitis due to pollen: Secondary | ICD-10-CM

## 2022-03-29 MED ORDER — PANTOPRAZOLE SODIUM 40 MG PO TBEC: 40.0000 mg | DELAYED_RELEASE_TABLET | Freq: Every day | ORAL | 1 refills | Status: DC

## 2022-03-29 MED ORDER — VITAMIN D3 25 MCG (1000 UNIT) PO TABS: 1000.0000 [IU] | ORAL_TABLET | Freq: Every day | ORAL | 1 refills | Status: DC

## 2022-03-29 MED ORDER — GUAIFENESIN ER 600 MG PO TB12: 600.0000 mg | ORAL_TABLET | Freq: Two times a day (BID) | ORAL | 0 refills | Status: AC | PRN

## 2022-03-29 MED ORDER — ROSUVASTATIN CALCIUM 20 MG PO TABS: 20.0000 mg | ORAL_TABLET | Freq: Every day | ORAL | 3 refills | Status: DC

## 2022-03-29 MED ORDER — HYDROCHLOROTHIAZIDE 12.5 MG PO TABS: 12.5000 mg | ORAL_TABLET | Freq: Every day | ORAL | 1 refills | Status: DC

## 2022-03-29 MED ORDER — CARVEDILOL 12.5 MG PO TABS: 12.5000 mg | ORAL_TABLET | Freq: Two times a day (BID) | ORAL | 5 refills | Status: DC

## 2022-03-29 MED ORDER — BUDESONIDE-FORMOTEROL FUMARATE 160-4.5 MCG/ACT IN AERO: INHALATION_SPRAY | RESPIRATORY_TRACT | 4 refills | Status: DC

## 2022-03-29 MED ORDER — RIVAROXABAN 10 MG PO TABS: 10.0000 mg | ORAL_TABLET | Freq: Every day | ORAL | 3 refills | Status: DC

## 2022-03-29 MED ORDER — METFORMIN HCL ER 500 MG PO TB24: 500.0000 mg | ORAL_TABLET | Freq: Two times a day (BID) | ORAL | 1 refills | Status: DC

## 2022-03-29 MED ORDER — MONTELUKAST SODIUM 10 MG PO TABS: ORAL_TABLET | ORAL | 3 refills | Status: DC

## 2022-03-29 MED ORDER — LOSARTAN POTASSIUM 50 MG PO TABS: 50.0000 mg | ORAL_TABLET | Freq: Every day | ORAL | 3 refills | Status: DC

## 2022-03-29 NOTE — Addendum Note (Signed)
Addended by: Randalyn Rhea on: 03/29/2022 08:52 AM   Modules accepted: Orders

## 2022-03-29 NOTE — Progress Notes (Signed)
Urine microalbumin ratio is increased - was 67 last year, is 121 now I will increase Losartan from 25mg  daily to 50mg  daily Called patient to review results & explain change - answered all questions.  She also asked for me to send in all meds to [facility] on [address], which I will do today

## 2022-04-07 ENCOUNTER — Other Ambulatory Visit: Payer: Self-pay | Admitting: Student

## 2022-04-07 DIAGNOSIS — J4530 Mild persistent asthma, uncomplicated: Secondary | ICD-10-CM

## 2022-04-23 ENCOUNTER — Other Ambulatory Visit: Payer: Self-pay | Admitting: Internal Medicine

## 2022-08-03 ENCOUNTER — Ambulatory Visit (INDEPENDENT_AMBULATORY_CARE_PROVIDER_SITE_OTHER): Payer: 59

## 2022-08-03 VITALS — Ht 63.5 in | Wt 254.0 lb

## 2022-08-03 DIAGNOSIS — Z0000 Encounter for general adult medical examination without abnormal findings: Secondary | ICD-10-CM

## 2022-08-03 NOTE — Progress Notes (Signed)
Subjective:   Caroline [name] is a 77 y.o. female who presents for Medicare Annual (Subsequent) preventive examination.  Visit Complete: Virtual  I connected with  Caroline [name] on 08/03/22 by a audio enabled telemedicine application and verified that I am speaking with the correct person using two identifiers.  Patient Location: Home  Provider Location: Office/Clinic  I discussed the limitations of evaluation and management by telemedicine. The patient expressed understanding and agreed to proceed.  Per patient no change in vitals since last visit; unable to obtain new vitals due to this being a telehealth visit.   Review of Systems    Cardiac Risk Factors include: advanced age (>26men, >52 women);diabetes mellitus;dyslipidemia;hypertension;Other (see comment), Risk factor comments: CHF     Objective:    Today's Vitals   08/03/22 1428  Weight: 254 lb (115.2 kg)  Height: 5' 3.5" (1.613 m)   Body mass index is 44.29 kg/m.     08/03/2022    2:38 PM 03/23/2022   10:04 AM 12/14/2021   11:39 AM 08/25/2021    5:30 PM 07/24/2021    1:00 AM 07/23/2021   11:56 AM 03/01/2021   11:01 AM  Advanced Directives  Does Patient Have a Medical Advance Directive? Yes No No No No No No  Type of Estate agent of [place];Living will        Copy of Healthcare Power of Attorney in Chart? No - copy requested        Would patient like information on creating a medical advance directive?  No - Patient declined No - Patient declined No - Patient declined No - Patient declined  No - Patient declined    Current Medications (verified) Outpatient Encounter Medications as of 08/03/2022  Medication Sig   acetaminophen (TYLENOL) 500 MG tablet Take 500 mg by mouth 4 (four) times daily as needed for mild pain or headache.   albuterol (VENTOLIN HFA) 108 (90 Base) MCG/ACT inhaler TAKE 2 PUFFS BY MOUTH EVERY 6 HOURS AS NEEDED FOR WHEEZE OR SHORTNESS OF BREATH   budesonide-formoterol  (SYMBICORT) 160-4.5 MCG/ACT inhaler TAKE 2 PUFFS BY MOUTH TWICE A DAY   carvedilol (COREG) 12.5 MG tablet Take 1 tablet (12.5 mg total) by mouth 2 (two) times daily with a meal.   cholecalciferol 25 MCG (1000 UT) tablet Take 1 tablet (1,000 Units total) by mouth daily.   diclofenac Sodium (VOLTAREN) 1 % GEL Apply 4 g topically 4 (four) times daily.   fluticasone (FLONASE) 50 MCG/ACT nasal spray PLACE 2 SPRAYS DAILY INTO BOTH NOSTRILS.   hydrochlorothiazide (HYDRODIURIL) 12.5 MG tablet Take 1 tablet (12.5 mg total) by mouth daily.   hydrocortisone (ANUSOL-HC) 2.5 % rectal cream PLACE 1 APPLICATION RECTALLY 2 (TWO) TIMES DAILY.   losartan (COZAAR) 50 MG tablet Take 1 tablet (50 mg total) by mouth daily.   meclizine (ANTIVERT) 25 MG tablet TAKE 1 TABLET BY MOUTH THREE TIMES A DAY AS NEEDED   metFORMIN (GLUCOPHAGE-XR) 500 MG 24 hr tablet Take 1 tablet (500 mg total) by mouth 2 (two) times daily with a meal.   montelukast (SINGULAIR) 10 MG tablet TAKE 1 TABLET BY MOUTH EVERYDAY AT BEDTIME   pantoprazole (PROTONIX) 40 MG tablet Take 1 tablet (40 mg total) by mouth daily.   Psyllium (METAMUCIL PO) Take by mouth at bedtime. gummies   RESTASIS 0.05 % ophthalmic emulsion INSTILL 1 DROP INTO BOTH EYES TWICE A DAY   rivaroxaban (XARELTO) 10 MG TABS tablet Take 1 tablet (10 mg total)  by mouth daily.   rosuvastatin (CRESTOR) 20 MG tablet Take 1 tablet (20 mg total) by mouth daily.   No facility-administered encounter medications on file as of 08/03/2022.    Allergies (verified) Morphine sulfate, Penicillins, Cabbage, Keflex [cephalexin], Other, Shellfish allergy, Tomato, and Latex   History: Past Medical History:  Diagnosis Date   Anticoagulated on warfarin    Arthritis    Asthma    Bronchitis    CHF (congestive heart failure) (HCC)    Diabetes (HCC) 2019   Diverticulitis    DVT, lower extremity, recurrent (HCC)    Patient had unprovoked PE on 2002 and DVT in right lower extremety 2008.    Elevated INR    Frequent urination 03/21/2019   GERD (gastroesophageal reflux disease)    Hematuria, microscopic 09/13/2017   History of kidney stones    Hypertension    Hypokalemia 05/01/2017   Lower abdominal pain 02/20/2018   PE (pulmonary embolism)    Patient had unprovoked PE on 2002   PONV (postoperative nausea and vomiting)    Vertigo    Past Surgical History:  Procedure Laterality Date   ABDOMINAL HYSTERECTOMY     1980   CYSTOSCOPY W/ URETERAL STENT PLACEMENT Right 09/16/2015   Procedure: CYSTOSCOPY WITH RETROGRADE PYELOGRAM/URETERAL STENT PLACEMENT;  Surgeon: Alfredo Martinez, MD;  Location: [facility] OR;  Service: Urology;  Laterality: Right;   CYSTOSCOPY WITH RETROGRADE PYELOGRAM, URETEROSCOPY AND STENT PLACEMENT Right 12/11/2015   Procedure: RIGHT URETEROSCOPY/HOLMIUM LASER LITHOTRIPSY AND STONE REMOVAL removal and placement of double j stent;  Surgeon: Crist Fat, MD;  Location: [facility];  Service: Urology;  Laterality: Right;   HOLMIUM LASER APPLICATION Right 12/11/2015   Procedure: HOLMIUM LASER APPLICATION;  Surgeon: Crist Fat, MD;  Location: [facility];  Service: Urology;  Laterality: Right;   Family History  Problem Relation Age of Onset   Cancer Mother    Hypertension Sister    Diabetes Sister    Hypertension Brother    Diabetes Brother    Hypertension Sister    Diabetes Sister    Colon cancer Other    Heart Problems Other 34       sister's child, open heart surgery   CVA Father    Diabetes Son    Hypertension Son    Kidney disease Son        on dialysis   Hypertension Sister    Diabetes Sister    Hypertension Sister    Diabetes Sister    Cancer Brother    Hypertension Son    Diabetes Son    Hypertension Daughter    Schizophrenia Daughter    Social History   Socioeconomic History   Marital status: Divorced    Spouse name: Not on file   Number of children: 5   Years of education: 9 th grade   Highest education level: 9th grade  Occupational  History   Occupation: retired  Tobacco Use   Smoking status: Former    Current packs/day: 0.00    Types: Cigarettes    Quit date: 09/18/1988    Years since quitting: 33.8   Smokeless tobacco: Never   Tobacco comments:    6 pack year smoking history as a teen  Vaping Use   Vaping status: Never Used  Substance and Sexual Activity   Alcohol use: No   Drug use: No   Sexual activity: Not Currently    Birth control/protection: None  Other Topics Concern   Not on file  Social  History Narrative   Current Social History 08/28/2019   Patient lives with 9 yo granddaughter in a one story home. There are not steps up to the entrance the patient uses.       Patient's method of transportation is church member.      The highest level of education was 9th grade      The patient currently retired.      Identified important Relationships are God, family       Pets : 1 lab/pitt mix named Cinnamon       Interests / Fun: Church,TV       Current Stressors: "Me and my granddaughter"       Religious / Personal Beliefs: I'm Holiness and I love people"       Other: "I'd give away my last dime."       L. Ducatte, BSN, RN-BC    Social Determinants of Health   Financial Resource Strain: Medium Risk (05/17/2017)   Overall Financial Resource Strain (CARDIA)    Difficulty of Paying Living Expenses: Somewhat hard  Food Insecurity: No Food Insecurity (01/14/2022)   Hunger Vital Sign    Worried About Running Out of Food in the Last Year: Never true    Ran Out of Food in the Last Year: Never true  Transportation Needs: No Transportation Needs (08/03/2022)   PRAPARE - Administrator, Civil Service (Medical): No    Lack of Transportation (Non-Medical): No  Physical Activity: Inactive (08/03/2022)   Exercise Vital Sign    Days of Exercise per Week: 0 days    Minutes of Exercise per Session: 0 min  Stress: Stress Concern Present (05/17/2017)   [employer] of Occupational Health -  Occupational Stress Questionnaire    Feeling of Stress : To some extent  Social Connections: Moderately Isolated (08/03/2022)   Social Connection and Isolation Panel [NHANES]    Frequency of Communication with Friends and Family: More than three times a week    Frequency of Social Gatherings with Friends and Family: Twice a week    Attends Religious Services: 1 to 4 times per year    Active Member of [employer] or Organizations: No    Attends Engineer, structural: Never    Marital Status: Divorced    Tobacco Counseling Counseling given: Not Answered Tobacco comments: 6 pack year smoking history as a teen   Clinical Intake:  Pre-visit preparation completed: Yes  Pain : No/denies pain     BMI - recorded: 44.29 Nutritional Status: BMI > 30  Obese Nutritional Risks: None Diabetes: Yes CBG done?: No Did pt. bring in CBG monitor from home?: No  How often do you need to have someone help you when you read instructions, pamphlets, or other written materials from your doctor or pharmacy?: 1 - Never  Interpreter Needed?: No  Information entered by :: Clarine Elrod, RMA   Activities of Daily Living    08/03/2022    2:31 PM 01/14/2022   11:23 AM  In your present state of health, do you have any difficulty performing the following activities:  Hearing? 0 0  Vision? 0 0  Comment per patient can  not see far off.   Difficulty concentrating or making decisions? 0 0  Walking or climbing stairs? 0 1  Dressing or bathing? 0 1  Comment  AT TIMES  Doing errands, shopping? 0 0  Comment Per patient, has a friend to drive her around   Preparing Food and eating ?  N   Using the Toilet? N   In the past six months, have you accidently leaked urine? N   Do you have problems with loss of bowel control? N   Managing your Medications? N   Managing your Finances? N   Housekeeping or managing your Housekeeping? N     Patient Care Team: Mercie Eon, MD as PCP - General (Internal  Medicine) Regan Lemming, MD as PCP - Cardiology (Cardiology) My Conley Rolls, [place] as Consulting Physician (Optometry) Elicia Lamp, RPH-CPP (Pharmacist) Plyler, Cecil Cranker, RD as Dietitian (Dietician)  Indicate any recent Medical Services you may have received from other than [facility] providers in the past year (date may be approximate).     Assessment:   This is a routine wellness examination for [place].  Hearing/Vision screen Hearing Screening - Comments:: Denies hearing difficulties   Vision Screening - Comments:: Wears eyeglasses  Dietary issues and exercise activities discussed:     Goals Addressed               This Visit's Progress     Keep herself up and go to church and do what she can do. (pt-stated)        Keep herself up and go to church and do what she can do.      Depression Screen    08/03/2022    2:49 PM 01/14/2022   11:23 AM 12/14/2021   11:33 AM 05/18/2021   12:03 PM 03/01/2021   11:08 AM 07/28/2020   11:48 AM 11/25/2019    4:30 PM  PHQ 2/9 Scores  PHQ - 2 Score 0 0 2 0 0 0 0  PHQ- 9 Score 0 0 9   0 0    Fall Risk    08/03/2022    2:40 PM 03/23/2022   10:02 AM 01/14/2022   11:22 AM 12/14/2021   11:33 AM 05/18/2021   12:01 PM  Fall Risk   Falls in the past year? 1 1 1  0 0  Comment    patient had a fall this year 2023   Number falls in past yr: 0 0 1 0 0  Injury with Fall? 1 0 1 1 0  Follow up Education provided;Falls prevention discussed Falls evaluation completed Falls evaluation completed Falls evaluation completed Falls evaluation completed    MEDICARE RISK AT HOME:  Medicare Risk at Home - 08/03/22 1440     Any stairs in or around the home? No    Home free of loose throw rugs in walkways, pet beds, electrical cords, etc? Yes    Adequate lighting in your home to reduce risk of falls? Yes    Life alert? Yes   Per patient-she does not wear it.   Use of a cane, walker or w/c? Yes    Grab bars in the bathroom? No    Shower chair or bench in shower? Yes     Elevated toilet seat or a handicapped toilet? No             TIMED UP AND GO:  Was the test performed?  No    Cognitive Function:        08/03/2022    2:43 PM  6CIT Screen  What Year? 0 points  What month? 0 points  What time? 0 points  Count back from 20 0 points  Months in reverse 0 points  Repeat phrase 2 points  Total Score 2 points    Immunizations Immunization History  Administered Date(s)  Administered   Fluad Quad(high Dose 65+) 10/28/2019, 09/21/2020, 10/11/2021   Influenza Split 10/24/2011   Influenza Whole 11/07/2005   Influenza,inj,Quad PF,6+ Mos 11/20/2012, 10/14/2013, 11/03/2014, 11/09/2015, 09/26/2016, 08/29/2017, 09/24/2018   PPD Test 09/23/2015   Pneumococcal Conjugate-13 12/07/2015   Pneumococcal Polysaccharide-23 08/11/2003, 05/29/2012   Tdap 01/30/2018    TDAP status: Up to date  Flu Vaccine status: Up to date  Pneumococcal vaccine status: Up to date  Covid-19 vaccine status: Completed vaccines  Qualifies for Shingles Vaccine? Yes   Zostavax completed No   Shingrix Completed?: No.    Education has been provided regarding the importance of this vaccine. Patient has been advised to call insurance company to determine out of pocket expense if they have not yet received this vaccine. Advised may also receive vaccine at local pharmacy or Health Dept. Verbalized acceptance and understanding.  Screening Tests Health Maintenance  Topic Date Due   Zoster Vaccines- Shingrix (1 of 2) Never done   FOOT EXAM  03/01/2022   HEMOGLOBIN A1C  06/23/2022   INFLUENZA VACCINE  08/11/2022   OPHTHALMOLOGY EXAM  10/14/2022   Diabetic kidney evaluation - eGFR measurement  03/23/2023   Diabetic kidney evaluation - Urine ACR  03/23/2023   Medicare Annual Wellness (AWV)  08/03/2023   DTaP/Tdap/Td (2 - Td or Tdap) 01/31/2028   Pneumonia Vaccine 63+ Years old  Completed   DEXA SCAN  Completed   Hepatitis C Screening  Completed   HPV VACCINES  Aged Out    COLON CANCER SCREENING ANNUAL FOBT  Discontinued   Colonoscopy  Discontinued   COVID-19 Vaccine  Discontinued    Health Maintenance  Health Maintenance Due  Topic Date Due   Zoster Vaccines- Shingrix (1 of 2) Never done   FOOT EXAM  03/01/2022   HEMOGLOBIN A1C  06/23/2022    Colorectal cancer screening: No longer required.   Mammogram status: Completed 11/19/2021. Repeat every year  Bone Density status: Completed 04/04/2016. Results reflect: Bone density results: NORMAL. Repeat every (patient declines) years.  Lung Cancer Screening: (Low Dose CT Chest recommended if Age 38-80 years, 20 pack-year currently smoking OR have quit w/in 15years.) does not qualify.   Lung Cancer Screening Referral: N/A  Additional Screening:  Hepatitis C Screening: does qualify; Completed 05/17/2017  Vision Screening: Recommended annual ophthalmology exams for early detection of glaucoma and other disorders of the eye. Is the patient up to date with their annual eye exam?  Yes  Who is the provider or what is the name of the office in which the patient attends annual eye exams? [facility] If pt is not established with a provider, would they like to be referred to a provider to establish care? No .   Dental Screening: Recommended annual dental exams for proper oral hygiene  Diabetic Foot Exam: Diabetic Foot Exam: Completed 03/01/2021  Community Resource Referral / Chronic Care Management: CRR required this visit?  No   CCM required this visit?  No     Plan:     I have personally reviewed and noted the following in the patient's chart:   Medical and social history Use of alcohol, tobacco or illicit drugs  Current medications and supplements including opioid prescriptions. Patient is not currently taking opioid prescriptions. Functional ability and status Nutritional status Physical activity Advanced directives List of other physicians Hospitalizations, surgeries, and ER visits in previous 12  months Vitals Screenings to include cognitive, depression, and falls Referrals and appointments  In addition, I have reviewed and discussed with  patient certain preventive protocols, quality metrics, and best practice recommendations. A written personalized care plan for preventive services as well as general preventive health recommendations were provided to patient.     Anamae Rochelle L Layan Zalenski, CMA   08/03/2022   After Visit Summary: (Mail) Due to this being a telephonic visit, the after visit summary with patients personalized plan was offered to patient via mail   Nurse Notes: Patient is due for her flu vaccine and she has an annual eye exam coming up before the end of the year.

## 2022-08-03 NOTE — Patient Instructions (Signed)
Caroline [name] , Thank you for taking time to come for your Medicare Wellness Visit. I appreciate your ongoing commitment to your health goals. Please review the following plan we discussed and let me know if I can assist you in the future.   These are the goals we discussed:  Goals       Keep herself up and go to church and do what she can do. (pt-stated)      Keep herself up and go to church and do what she can do.        This is a list of the screening recommended for you and due dates:  Health Maintenance  Topic Date Due   Zoster (Shingles) Vaccine (1 of 2) Never done   Complete foot exam   03/01/2022   Hemoglobin A1C  06/23/2022   Flu Shot  08/11/2022   Eye exam for diabetics  10/14/2022   Yearly kidney function blood test for diabetes  03/23/2023   Yearly kidney health urinalysis for diabetes  03/23/2023   Medicare Annual Wellness Visit  08/03/2023   DTaP/Tdap/Td vaccine (2 - Td or Tdap) 01/31/2028   Pneumonia Vaccine  Completed   DEXA scan (bone density measurement)  Completed   Hepatitis C Screening  Completed   HPV Vaccine  Aged Out   Stool Blood Test  Discontinued   Colon Cancer Screening  Discontinued   COVID-19 Vaccine  Discontinued    Advanced directives: Please bring a copy of your health care power of attorney and living will to the office to be added to your chart at your convenience.   Conditions/risks identified: Each day, aim for 6 glasses of water, plenty of protein in your diet and try to get up and walk/ stretch every hour for 5-10 minutes at a time.  Remember to get your flu vaccine during your next office visit or at your pharmacy.  Next appointment: Follow up in one year for your annual wellness visit    Preventive Care 65 Years and Older, Female Preventive care refers to lifestyle choices and visits with your health care provider that can promote health and wellness. What does preventive care include? A yearly physical exam. This is also called an  annual well check. Dental exams once or twice a year. Routine eye exams. Ask your health care provider how often you should have your eyes checked. Personal lifestyle choices, including: Daily care of your teeth and gums. Regular physical activity. Eating a healthy diet. Avoiding tobacco and drug use. Limiting alcohol use. Practicing safe sex. Taking low-dose aspirin every day. Taking vitamin and mineral supplements as recommended by your health care provider. What happens during an annual well check? The services and screenings done by your health care provider during your annual well check will depend on your age, overall health, lifestyle risk factors, and family history of disease. Counseling  Your health care provider may ask you questions about your: Alcohol use. Tobacco use. Drug use. Emotional well-being. Home and relationship well-being. Sexual activity. Eating habits. History of falls. Memory and ability to understand (cognition). Work and work Astronomer. Reproductive health. Screening  You may have the following tests or measurements: Height, weight, and BMI. Blood pressure. Lipid and cholesterol levels. These may be checked every 5 years, or more frequently if you are over 75 years old. Skin check. Lung cancer screening. You may have this screening every year starting at age 52 if you have a 30-pack-year history of smoking and currently smoke or have  quit within the past 15 years. Fecal occult blood test (FOBT) of the stool. You may have this test every year starting at age 3. Flexible sigmoidoscopy or colonoscopy. You may have a sigmoidoscopy every 5 years or a colonoscopy every 10 years starting at age 28. Hepatitis C blood test. Hepatitis B blood test. Sexually transmitted disease (STD) testing. Diabetes screening. This is done by checking your blood sugar (glucose) after you have not eaten for a while (fasting). You may have this done every 1-3 years. Bone  density scan. This is done to screen for osteoporosis. You may have this done starting at age 31. Mammogram. This may be done every 1-2 years. Talk to your health care provider about how often you should have regular mammograms. Talk with your health care provider about your test results, treatment options, and if necessary, the need for more tests. Vaccines  Your health care provider may recommend certain vaccines, such as: Influenza vaccine. This is recommended every year. Tetanus, diphtheria, and acellular pertussis (Tdap, Td) vaccine. You may need a Td booster every 10 years. Zoster vaccine. You may need this after age 24. Pneumococcal 13-valent conjugate (PCV13) vaccine. One dose is recommended after age 78. Pneumococcal polysaccharide (PPSV23) vaccine. One dose is recommended after age 21. Talk to your health care provider about which screenings and vaccines you need and how often you need them. This information is not intended to replace advice given to you by your health care provider. Make sure you discuss any questions you have with your health care provider. Document Released: 01/23/2015 Document Revised: 09/16/2015 Document Reviewed: 10/28/2014 Elsevier Interactive Patient Education  2017 [employer].  Fall Prevention in the Home Falls can cause injuries. They can happen to people of all ages. There are many things you can do to make your home safe and to help prevent falls. What can I do on the outside of my home? Regularly fix the edges of walkways and driveways and fix any cracks. Remove anything that might make you trip as you walk through a door, such as a raised step or threshold. Trim any bushes or trees on the path to your home. Use bright outdoor lighting. Clear any walking paths of anything that might make someone trip, such as rocks or tools. Regularly check to see if handrails are loose or broken. Make sure that both sides of any steps have handrails. Any raised  decks and porches should have guardrails on the edges. Have any leaves, snow, or ice cleared regularly. Use sand or salt on walking paths during winter. Clean up any spills in your garage right away. This includes oil or grease spills. What can I do in the bathroom? Use night lights. Install grab bars by the toilet and in the tub and shower. Do not use towel bars as grab bars. Use non-skid mats or decals in the tub or shower. If you need to sit down in the shower, use a plastic, non-slip stool. Keep the floor dry. Clean up any water that spills on the floor as soon as it happens. Remove soap buildup in the tub or shower regularly. Attach bath mats securely with double-sided non-slip rug tape. Do not have throw rugs and other things on the floor that can make you trip. What can I do in the bedroom? Use night lights. Make sure that you have a light by your bed that is easy to reach. Do not use any sheets or blankets that are too big for your bed.  They should not hang down onto the floor. Have a firm chair that has side arms. You can use this for support while you get dressed. Do not have throw rugs and other things on the floor that can make you trip. What can I do in the kitchen? Clean up any spills right away. Avoid walking on wet floors. Keep items that you use a lot in easy-to-reach places. If you need to reach something above you, use a strong step stool that has a grab bar. Keep electrical cords out of the way. Do not use floor polish or wax that makes floors slippery. If you must use wax, use non-skid floor wax. Do not have throw rugs and other things on the floor that can make you trip. What can I do with my stairs? Do not leave any items on the stairs. Make sure that there are handrails on both sides of the stairs and use them. Fix handrails that are broken or loose. Make sure that handrails are as long as the stairways. Check any carpeting to make sure that it is firmly attached  to the stairs. Fix any carpet that is loose or worn. Avoid having throw rugs at the top or bottom of the stairs. If you do have throw rugs, attach them to the floor with carpet tape. Make sure that you have a light switch at the top of the stairs and the bottom of the stairs. If you do not have them, ask someone to add them for you. What else can I do to help prevent falls? Wear shoes that: Do not have high heels. Have rubber bottoms. Are comfortable and fit you well. Are closed at the toe. Do not wear sandals. If you use a stepladder: Make sure that it is fully opened. Do not climb a closed stepladder. Make sure that both sides of the stepladder are locked into place. Ask someone to hold it for you, if possible. Clearly mark and make sure that you can see: Any grab bars or handrails. First and last steps. Where the edge of each step is. Use tools that help you move around (mobility aids) if they are needed. These include: Canes. Walkers. Scooters. Crutches. Turn on the lights when you go into a dark area. Replace any light bulbs as soon as they burn out. Set up your furniture so you have a clear path. Avoid moving your furniture around. If any of your floors are uneven, fix them. If there are any pets around you, be aware of where they are. Review your medicines with your doctor. Some medicines can make you feel dizzy. This can increase your chance of falling. Ask your doctor what other things that you can do to help prevent falls. This information is not intended to replace advice given to you by your health care provider. Make sure you discuss any questions you have with your health care provider. Document Released: 10/23/2008 Document Revised: 06/04/2015 Document Reviewed: 01/31/2014 Elsevier Interactive Patient Education  2017 [employer].

## 2022-08-30 ENCOUNTER — Ambulatory Visit: Admission: RE | Admit: 2022-08-30 | Payer: 59 | Source: Ambulatory Visit

## 2022-08-30 DIAGNOSIS — R921 Mammographic calcification found on diagnostic imaging of breast: Secondary | ICD-10-CM | POA: Diagnosis not present

## 2022-08-30 NOTE — Progress Notes (Unsigned)
[facility]: Clinic Note  Subjective:  History of Present Illness: Caroline [name] is a 77 y.o. year old female who presents for routine follow up of her chronic medical conditions. I saw her in March.  She is requesting refills and incontinence supplies today.  She had a mammogram yesterday and was told everything looks stable.   No GERD symptoms.  She has gained 13 pounds since last visit.  No other concerns.  Please refer to Assessment and Plan below for full details in Problem-Based Charting.   Past Medical History:  Patient Active Problem List   Diagnosis Date Noted   Other chronic pulmonary embolism without acute cor pulmonale (HCC) 08/31/2022   Vitamin D deficiency 08/31/2022   Incontinence of urine in female 08/31/2022   Abnormal mammogram of left breast 08/31/2022   Positive fecal immunochemical test 07/28/2020   Diverticulitis 12/20/2018   GERD (gastroesophageal reflux disease) 01/30/2018   Depression 10/31/2017   Asthma, chronic, mild persistent, uncomplicated 08/29/2017   Type 2 diabetes mellitus with complication, without long-term current use of insulin (HCC) 06/02/2017   Obesity, Class III, BMI 40-49.9 (morbid obesity) (HCC) 05/02/2016   Chronic combined systolic and diastolic CHF, NYHA class 1 (HCC) 09/21/2015   DVT, lower extremity, recurrent (HCC)    Vertigo 09/13/2011   Healthcare maintenance 09/13/2011   Allergic rhinitis 06/21/2006   Hyperlipidemia 06/10/2006   Essential hypertension 06/10/2006      Medications:  Current Outpatient Medications:    acetaminophen (TYLENOL) 500 MG tablet, Take 500 mg by mouth 4 (four) times daily as needed for mild pain or headache., Disp: , Rfl:    albuterol (VENTOLIN HFA) 108 (90 Base) MCG/ACT inhaler, TAKE 2 PUFFS BY MOUTH EVERY 6 HOURS AS NEEDED FOR WHEEZE OR SHORTNESS OF BREATH, Disp: 8.5 each, Rfl: 2   budesonide-formoterol (SYMBICORT) 80-4.5 MCG/ACT inhaler, Inhale 2 puffs into the lungs in  the morning and at bedtime., Disp: 10.2 g, Rfl: 8   carvedilol (COREG) 12.5 MG tablet, Take 1 tablet (12.5 mg total) by mouth 2 (two) times daily with a meal., Disp: 180 tablet, Rfl: 3   cholecalciferol 25 MCG (1000 UT) tablet, Take 1 tablet (1,000 Units total) by mouth daily., Disp: 90 tablet, Rfl: 1   diclofenac Sodium (VOLTAREN) 1 % GEL, Apply 4 g topically 4 (four) times daily., Disp: 4 g, Rfl: 3   fluticasone (FLONASE) 50 MCG/ACT nasal spray, PLACE 2 SPRAYS DAILY INTO BOTH NOSTRILS., Disp: 48 mL, Rfl: 1   hydrochlorothiazide (HYDRODIURIL) 12.5 MG tablet, Take 1 tablet (12.5 mg total) by mouth daily., Disp: 90 tablet, Rfl: 3   hydrocortisone (ANUSOL-HC) 2.5 % rectal cream, Place rectally 2 (two) times daily., Disp: 30 g, Rfl: 3   losartan (COZAAR) 50 MG tablet, Take 1 tablet (50 mg total) by mouth daily., Disp: 90 tablet, Rfl: 3   meclizine (ANTIVERT) 25 MG tablet, Take 1 tablet (25 mg total) by mouth 3 (three) times daily as needed., Disp: 90 tablet, Rfl: 5   metFORMIN (GLUCOPHAGE-XR) 500 MG 24 hr tablet, Take 1 tablet (500 mg total) by mouth 2 (two) times daily with a meal., Disp: 180 tablet, Rfl: 1   montelukast (SINGULAIR) 10 MG tablet, TAKE 1 TABLET BY MOUTH EVERYDAY AT BEDTIME, Disp: 90 tablet, Rfl: 3   pantoprazole (PROTONIX) 20 MG tablet, Take 1 tablet (20 mg total) by mouth daily., Disp: 90 tablet, Rfl: 3   Psyllium (METAMUCIL PO), Take by mouth at bedtime. gummies, Disp: , Rfl:  RESTASIS 0.05 % ophthalmic emulsion, INSTILL 1 DROP INTO BOTH EYES TWICE A DAY, Disp: 60 mL, Rfl: 1   rivaroxaban (XARELTO) 10 MG TABS tablet, Take 1 tablet (10 mg total) by mouth daily., Disp: 90 tablet, Rfl: 3   rosuvastatin (CRESTOR) 20 MG tablet, Take 1 tablet (20 mg total) by mouth daily., Disp: 90 tablet, Rfl: 3   Allergies: Allergies  Allergen Reactions   Morphine Sulfate Shortness Of Breath   Penicillins Anaphylaxis, Hives, Swelling and Rash    Has patient had a PCN reaction causing immediate  rash, facial/tongue/throat swelling, SOB or lightheadedness with hypotension: Yes Has patient had a PCN reaction causing severe rash involving mucus membranes or skin necrosis: Yes Has patient had a PCN reaction that required hospitalization Yes Has patient had a PCN reaction occurring within the last 10 years: No If all of the above answers are "NO", then may proceed with Cephalosporin use.    Cabbage Itching   Keflex [Cephalexin] Hives    And feeling of throat tightness   Other Itching    Lettuce - itching   Shellfish Allergy Swelling   Tomato Swelling   Latex Itching and Rash       Objective:   Vitals: Vitals:   08/31/22 1050  BP: (!) 148/79  Pulse: 74  Temp: 98.3 F (36.8 C)  SpO2: 100%     Physical Exam: Physical Exam Constitutional:      Appearance: Normal appearance. She is obese.  Cardiovascular:     Rate and Rhythm: Normal rate and regular rhythm.  Pulmonary:     Effort: Pulmonary effort is normal.     Breath sounds: Normal breath sounds.  Neurological:     Mental Status: She is alert.  Psychiatric:        Mood and Affect: Mood normal.        Behavior: Behavior normal.      Data: Labs, imaging, and micro were reviewed in Epic. Refer to Assessment and Plan below for full details in Problem-Based Charting.  Assessment & Plan:  Hyperlipidemia - chronic and stable - continue rosuvastatin 20mg  daily - lipid panel at next visit   Essential hypertension - chronic & stable - Continue hydrochlorothiazide 12.5mg  daily & losartan 50mg  daily. BMP today. If stable, I'll talk to her about increasing Losartan to 100mg  daily  Allergic rhinitis - chronic and stable - continue flonase  Vertigo - chronic and stable - Meclizine is very helpful for her. I counseled her on the possible negative side effects of this medicine, especially in an older patient, and she expressed understanding, and will use sparingly - refilled meclizine today  Healthcare  maintenance - patient will call to schedule her flu shot in september  DVT, lower extremity, recurrent (HCC) - chronic and stable - continue Xarelto 10mg  daily with food, a lifelong medicine  Chronic combined systolic and diastolic CHF, NYHA class 1 (HCC) - chronic and stable - EF 40-45% in 03/3019 - continue carvedilol 12.5mg  BID, hydrochlorothiazide 12.5mg  daily, losartan 50mg  daily for now. The hydrochlorothiazide is not GDMT, so we could talk about decreasing this in order to maximize the ARB. Discuss more at next visit & TTE next time. She may also benefit from SGLT2 in the future   Asthma, chronic, mild persistent, uncomplicated - chronic and stable - continue Symbicort & singulair with albuterol prn   Depression - mood seems excellent today, but need to check PHQ9 at next visit   GERD (gastroesophageal reflux disease) - chronic and stable -  decrease pantoprazole 40mg  daily to 20mg  daily, will try to wean off  Vitamin D deficiency - Check vitamin D today, if levels are normal, can stop supplementation   Incontinence of urine in female - Patient requesting the following incontinence supplies:  Incontinence supplies Pullups XL Bladder control supples Pads, wipes, gloves Booster mads Chux Mask  - I will talk with nursing staff to determine which company she needs to go through and get these sent in  Abnormal mammogram of left breast - Per report, she will need a bilateral diagnostic mammogram in October 2024. I have confirmed with Dr Manson Passey that she'll need bilateral in 10/2022 to keep her on schedule. Dr Heide Spark has placed order  Type 2 diabetes mellitus with complication, without long-term current use of insulin (HCC) - A1C ordered - Metformin 500mg  BID. Could do Jardiance if she needs another agent - Patient politely declined to remove stockings for foot exam, as this is difficult for her. I explained importance of daily foot exams, and she will do this - Repeat UMACR  at next visit to see different on higher dose of ARB  Obesity, Class III, BMI 40-49.9 (morbid obesity) (HCC) - Patient has gained 13 pounds since last visit, but wants to lose this weight. She is going to work on diet & increasing physical activity     Patient will follow up in 5 months  Mercie Eon, MD

## 2022-08-31 ENCOUNTER — Ambulatory Visit (INDEPENDENT_AMBULATORY_CARE_PROVIDER_SITE_OTHER): Payer: 59 | Admitting: Internal Medicine

## 2022-08-31 ENCOUNTER — Encounter: Payer: Self-pay | Admitting: Internal Medicine

## 2022-08-31 ENCOUNTER — Other Ambulatory Visit: Payer: Self-pay | Admitting: Internal Medicine

## 2022-08-31 VITALS — BP 148/79 | HR 74 | Temp 98.3°F | Ht 62.0 in | Wt 266.2 lb

## 2022-08-31 DIAGNOSIS — F32A Depression, unspecified: Secondary | ICD-10-CM

## 2022-08-31 DIAGNOSIS — E118 Type 2 diabetes mellitus with unspecified complications: Secondary | ICD-10-CM | POA: Diagnosis not present

## 2022-08-31 DIAGNOSIS — J4521 Mild intermittent asthma with (acute) exacerbation: Secondary | ICD-10-CM

## 2022-08-31 DIAGNOSIS — I2782 Chronic pulmonary embolism: Secondary | ICD-10-CM

## 2022-08-31 DIAGNOSIS — J301 Allergic rhinitis due to pollen: Secondary | ICD-10-CM

## 2022-08-31 DIAGNOSIS — Z6841 Body Mass Index (BMI) 40.0 and over, adult: Secondary | ICD-10-CM

## 2022-08-31 DIAGNOSIS — I82409 Acute embolism and thrombosis of unspecified deep veins of unspecified lower extremity: Secondary | ICD-10-CM | POA: Diagnosis not present

## 2022-08-31 DIAGNOSIS — I5042 Chronic combined systolic (congestive) and diastolic (congestive) heart failure: Secondary | ICD-10-CM | POA: Diagnosis not present

## 2022-08-31 DIAGNOSIS — J4530 Mild persistent asthma, uncomplicated: Secondary | ICD-10-CM

## 2022-08-31 DIAGNOSIS — I110 Hypertensive heart disease with heart failure: Secondary | ICD-10-CM

## 2022-08-31 DIAGNOSIS — R42 Dizziness and giddiness: Secondary | ICD-10-CM

## 2022-08-31 DIAGNOSIS — Z87891 Personal history of nicotine dependence: Secondary | ICD-10-CM

## 2022-08-31 DIAGNOSIS — R32 Unspecified urinary incontinence: Secondary | ICD-10-CM

## 2022-08-31 DIAGNOSIS — R928 Other abnormal and inconclusive findings on diagnostic imaging of breast: Secondary | ICD-10-CM

## 2022-08-31 DIAGNOSIS — E559 Vitamin D deficiency, unspecified: Secondary | ICD-10-CM

## 2022-08-31 DIAGNOSIS — K219 Gastro-esophageal reflux disease without esophagitis: Secondary | ICD-10-CM

## 2022-08-31 DIAGNOSIS — K649 Unspecified hemorrhoids: Secondary | ICD-10-CM

## 2022-08-31 DIAGNOSIS — E785 Hyperlipidemia, unspecified: Secondary | ICD-10-CM

## 2022-08-31 DIAGNOSIS — Z0000 Encounter for general adult medical examination without abnormal findings: Secondary | ICD-10-CM

## 2022-08-31 DIAGNOSIS — E66813 Obesity, class 3: Secondary | ICD-10-CM

## 2022-08-31 DIAGNOSIS — R921 Mammographic calcification found on diagnostic imaging of breast: Secondary | ICD-10-CM

## 2022-08-31 DIAGNOSIS — I10 Essential (primary) hypertension: Secondary | ICD-10-CM

## 2022-08-31 DIAGNOSIS — Z7985 Long-term (current) use of injectable non-insulin antidiabetic drugs: Secondary | ICD-10-CM

## 2022-08-31 DIAGNOSIS — F329 Major depressive disorder, single episode, unspecified: Secondary | ICD-10-CM

## 2022-08-31 DIAGNOSIS — E782 Mixed hyperlipidemia: Secondary | ICD-10-CM

## 2022-08-31 LAB — POCT GLYCOSYLATED HEMOGLOBIN (HGB A1C): Hemoglobin A1C: 6.8 % — AB (ref 4.0–5.6)

## 2022-08-31 LAB — GLUCOSE, CAPILLARY: Glucose-Capillary: 120 mg/dL — ABNORMAL HIGH (ref 70–99)

## 2022-08-31 MED ORDER — ALBUTEROL SULFATE HFA 108 (90 BASE) MCG/ACT IN AERS: INHALATION_SPRAY | RESPIRATORY_TRACT | 2 refills | Status: DC

## 2022-08-31 MED ORDER — MONTELUKAST SODIUM 10 MG PO TABS
ORAL_TABLET | ORAL | 3 refills | Status: DC
Start: 2022-08-31 — End: 2022-12-29

## 2022-08-31 MED ORDER — SYMBICORT 80-4.5 MCG/ACT IN AERO: 2.0000 | INHALATION_SPRAY | Freq: Two times a day (BID) | RESPIRATORY_TRACT | 8 refills | Status: DC

## 2022-08-31 MED ORDER — HYDROCORTISONE (PERIANAL) 2.5 % EX CREA
TOPICAL_CREAM | Freq: Two times a day (BID) | CUTANEOUS | 3 refills | Status: DC
Start: 2022-08-31 — End: 2022-12-29

## 2022-08-31 MED ORDER — LOSARTAN POTASSIUM 50 MG PO TABS
50.0000 mg | ORAL_TABLET | Freq: Every day | ORAL | 3 refills | Status: DC
Start: 2022-08-31 — End: 2022-12-29

## 2022-08-31 MED ORDER — PANTOPRAZOLE SODIUM 20 MG PO TBEC
20.0000 mg | DELAYED_RELEASE_TABLET | Freq: Every day | ORAL | 3 refills | Status: DC
Start: 2022-08-31 — End: 2022-12-29

## 2022-08-31 MED ORDER — MECLIZINE HCL 25 MG PO TABS
25.0000 mg | ORAL_TABLET | Freq: Three times a day (TID) | ORAL | 5 refills | Status: DC | PRN
Start: 2022-08-31 — End: 2022-12-23

## 2022-08-31 MED ORDER — HYDROCHLOROTHIAZIDE 12.5 MG PO TABS: 12.5000 mg | ORAL_TABLET | Freq: Every day | ORAL | 3 refills | Status: DC

## 2022-08-31 MED ORDER — CARVEDILOL 12.5 MG PO TABS
12.5000 mg | ORAL_TABLET | Freq: Two times a day (BID) | ORAL | 3 refills | Status: DC
Start: 2022-08-31 — End: 2022-12-29

## 2022-08-31 MED ORDER — RIVAROXABAN 10 MG PO TABS
10.0000 mg | ORAL_TABLET | Freq: Every day | ORAL | 3 refills | Status: DC
Start: 2022-08-31 — End: 2022-12-29

## 2022-08-31 NOTE — Assessment & Plan Note (Addendum)
-   Per report, she will need a bilateral diagnostic mammogram in October 2024. I have confirmed with Dr Manson Passey that she'll need bilateral in 10/2022 to keep her on schedule. Dr Heide Spark has placed order

## 2022-08-31 NOTE — Assessment & Plan Note (Signed)
-   chronic and stable - continue Symbicort & singulair with albuterol prn

## 2022-08-31 NOTE — Assessment & Plan Note (Signed)
-   A1C ordered - Metformin 500mg  BID. Could do Jardiance if she needs another agent - Patient politely declined to remove stockings for foot exam, as this is difficult for her. I explained importance of daily foot exams, and she will do this - Repeat UMACR at next visit to see different on higher dose of ARB

## 2022-08-31 NOTE — Assessment & Plan Note (Signed)
-   chronic and stable - Meclizine is very helpful for her. I counseled her on the possible negative side effects of this medicine, especially in an older patient, and she expressed understanding, and will use sparingly - refilled meclizine today

## 2022-08-31 NOTE — Assessment & Plan Note (Signed)
-   chronic and stable - decrease pantoprazole 40mg  daily to 20mg  daily, will try to wean off

## 2022-08-31 NOTE — Assessment & Plan Note (Signed)
-   patient will call to schedule her flu shot in september

## 2022-08-31 NOTE — Assessment & Plan Note (Signed)
-   chronic and stable - continue Xarelto 10mg  daily with food, a lifelong medicine

## 2022-08-31 NOTE — Assessment & Plan Note (Signed)
-   Check vitamin D today, if levels are normal, can stop supplementation

## 2022-08-31 NOTE — Assessment & Plan Note (Signed)
-   Patient has gained 13 pounds since last visit, but wants to lose this weight. She is going to work on diet & increasing physical activity

## 2022-08-31 NOTE — Assessment & Plan Note (Signed)
-   chronic & stable - Continue hydrochlorothiazide 12.5mg  daily & losartan 50mg  daily. BMP today. If stable, I'll talk to her about increasing Losartan to 100mg  daily

## 2022-08-31 NOTE — Patient Instructions (Signed)
Dear Caroline [name],  It was a pleasure seeing you in clinic today.  I am checking your labs today and will call you with those results.  I'm working on your incontinence supplies too.  I made 1 change to your medicines today - for your next refill of Pantoprazole, I am going to decrease the dose from 40mg  daily to 20mg  daily. If you start having more acid reflux symptoms, please call and let me know.  I'll plan on seeing you back in 5-6 months, after the holidays. Please call if you need anything before then.  Sincerely, Dr. Mercie Eon

## 2022-08-31 NOTE — Assessment & Plan Note (Signed)
-   chronic and stable - EF 40-45% in 03/3019 - continue carvedilol 12.5mg  BID, hydrochlorothiazide 12.5mg  daily, losartan 50mg  daily for now. The hydrochlorothiazide is not GDMT, so we could talk about decreasing this in order to maximize the ARB. Discuss more at next visit & TTE next time. She may also benefit from SGLT2 in the future

## 2022-08-31 NOTE — Assessment & Plan Note (Signed)
-   chronic and stable - continue rosuvastatin 20mg  daily - lipid panel at next visit

## 2022-08-31 NOTE — Assessment & Plan Note (Signed)
-   chronic and stable - continue flonase

## 2022-08-31 NOTE — Assessment & Plan Note (Signed)
-   mood seems excellent today, but need to check PHQ9 at next visit

## 2022-08-31 NOTE — Assessment & Plan Note (Signed)
-   Patient requesting the following incontinence supplies:  Incontinence supplies Pullups XL Bladder control supples Pads, wipes, gloves Booster mads Chux Mask  - I will talk with nursing staff to determine which company she needs to go through and get these sent in

## 2022-09-01 LAB — BMP8+ANION GAP
Anion Gap: 15 mmol/L (ref 10.0–18.0)
BUN/Creatinine Ratio: 13 (ref 12–28)
BUN: 11 mg/dL (ref 8–27)
CO2: 27 mmol/L (ref 20–29)
Calcium: 9.7 mg/dL (ref 8.7–10.3)
Chloride: 99 mmol/L (ref 96–106)
Creatinine, Ser: 0.87 mg/dL (ref 0.57–1.00)
Glucose: 128 mg/dL — ABNORMAL HIGH (ref 70–99)
Potassium: 3.9 mmol/L (ref 3.5–5.2)
Sodium: 141 mmol/L (ref 134–144)
eGFR: 69 mL/min/{1.73_m2} (ref >59)

## 2022-09-01 LAB — VITAMIN D 25 HYDROXY (VIT D DEFICIENCY, FRACTURES): Vit D, 25-Hydroxy: 49.5 ng/mL (ref 30.0–100.0)

## 2022-09-01 NOTE — Addendum Note (Signed)
Addended by: Derrek Monaco on: 09/01/2022 01:25 PM   Modules accepted: Orders

## 2022-09-01 NOTE — Addendum Note (Signed)
Addended by: Derrek Monaco on: 09/01/2022 08:36 AM   Modules accepted: Orders

## 2022-12-23 ENCOUNTER — Other Ambulatory Visit: Payer: Self-pay

## 2022-12-23 DIAGNOSIS — E118 Type 2 diabetes mellitus with unspecified complications: Secondary | ICD-10-CM

## 2022-12-23 DIAGNOSIS — J4530 Mild persistent asthma, uncomplicated: Secondary | ICD-10-CM

## 2022-12-23 DIAGNOSIS — R42 Dizziness and giddiness: Secondary | ICD-10-CM

## 2022-12-23 MED ORDER — METFORMIN HCL ER 500 MG PO TB24: 500.0000 mg | ORAL_TABLET | Freq: Two times a day (BID) | ORAL | 1 refills | Status: DC

## 2022-12-23 MED ORDER — ALBUTEROL SULFATE HFA 108 (90 BASE) MCG/ACT IN AERS: INHALATION_SPRAY | RESPIRATORY_TRACT | 2 refills | Status: DC

## 2022-12-23 MED ORDER — MECLIZINE HCL 25 MG PO TABS: 25.0000 mg | ORAL_TABLET | Freq: Three times a day (TID) | ORAL | 5 refills | Status: DC | PRN

## 2022-12-23 MED ORDER — BUDESONIDE-FORMOTEROL FUMARATE 80-4.5 MCG/ACT IN AERO: 2.0000 | INHALATION_SPRAY | Freq: Two times a day (BID) | RESPIRATORY_TRACT | 8 refills | Status: DC

## 2022-12-29 ENCOUNTER — Other Ambulatory Visit: Payer: Self-pay | Admitting: Internal Medicine

## 2022-12-29 ENCOUNTER — Telehealth: Payer: Self-pay | Admitting: *Deleted

## 2022-12-29 ENCOUNTER — Other Ambulatory Visit: Payer: Self-pay | Admitting: *Deleted

## 2022-12-29 DIAGNOSIS — J4521 Mild intermittent asthma with (acute) exacerbation: Secondary | ICD-10-CM

## 2022-12-29 DIAGNOSIS — I10 Essential (primary) hypertension: Secondary | ICD-10-CM

## 2022-12-29 DIAGNOSIS — E118 Type 2 diabetes mellitus with unspecified complications: Secondary | ICD-10-CM

## 2022-12-29 DIAGNOSIS — I82409 Acute embolism and thrombosis of unspecified deep veins of unspecified lower extremity: Secondary | ICD-10-CM

## 2022-12-29 DIAGNOSIS — R42 Dizziness and giddiness: Secondary | ICD-10-CM

## 2022-12-29 DIAGNOSIS — E7800 Pure hypercholesterolemia, unspecified: Secondary | ICD-10-CM

## 2022-12-29 DIAGNOSIS — K219 Gastro-esophageal reflux disease without esophagitis: Secondary | ICD-10-CM

## 2022-12-29 DIAGNOSIS — J4530 Mild persistent asthma, uncomplicated: Secondary | ICD-10-CM

## 2022-12-29 DIAGNOSIS — K649 Unspecified hemorrhoids: Secondary | ICD-10-CM

## 2022-12-29 MED ORDER — BUDESONIDE-FORMOTEROL FUMARATE 160-4.5 MCG/ACT IN AERO: 2.0000 | INHALATION_SPRAY | Freq: Two times a day (BID) | RESPIRATORY_TRACT | 11 refills | Status: DC

## 2022-12-29 NOTE — Telephone Encounter (Signed)
Call from patient said that the wrong dose of the Symbicort ws filled .  Patient is unable to tolerate the Symbicort 80-4.5.  Spoke with Dr. Lafonda Mosses who sent in a new prescription for the Symbicort 160-4.5.  Pharmacy to discontinue the Symbicort 80-4.5 dose.  Since patient picked up the 80.4.5 on yesterday the insurance is rejecting the new prescription.  Patient picked up the Symbicort 160-4.5 on 12/16/2022.  The earliest she can get the Symbicort 160-4.5 will be 01/16/2023.  Patient states she has enough of the Symbicort 160-4.5 until 01/16/2023.  Patient also requested that all of her medications be refilled until her next appointment. No appointments are scheduled for the patient.  Patient will call back on tomorrow to schedule a follow up appointment in the Clinics.

## 2022-12-29 NOTE — Progress Notes (Signed)
Sent in 160 dose

## 2022-12-30 MED ORDER — MECLIZINE HCL 25 MG PO TABS
25.0000 mg | ORAL_TABLET | Freq: Three times a day (TID) | ORAL | 5 refills | Status: AC | PRN
Start: 2022-12-30 — End: ?

## 2022-12-30 MED ORDER — PANTOPRAZOLE SODIUM 20 MG PO TBEC
20.0000 mg | DELAYED_RELEASE_TABLET | Freq: Every day | ORAL | 3 refills | Status: AC
Start: 2022-12-30 — End: 2023-12-30

## 2022-12-30 MED ORDER — CARVEDILOL 12.5 MG PO TABS
12.5000 mg | ORAL_TABLET | Freq: Two times a day (BID) | ORAL | 3 refills | Status: AC
Start: 2022-12-30 — End: 2023-12-30

## 2022-12-30 MED ORDER — RIVAROXABAN 10 MG PO TABS
10.0000 mg | ORAL_TABLET | Freq: Every day | ORAL | 3 refills | Status: AC
Start: 2022-12-30 — End: 2023-12-30

## 2022-12-30 MED ORDER — ACETAMINOPHEN 500 MG PO TABS: 500.0000 mg | ORAL_TABLET | Freq: Four times a day (QID) | ORAL | 3 refills | Status: DC | PRN

## 2022-12-30 MED ORDER — FLUTICASONE PROPIONATE 50 MCG/ACT NA SUSP: 1.0000 | Freq: Every day | NASAL | 1 refills | Status: DC

## 2022-12-30 MED ORDER — MONTELUKAST SODIUM 10 MG PO TABS: ORAL_TABLET | ORAL | 3 refills | Status: DC

## 2022-12-30 MED ORDER — LOSARTAN POTASSIUM 50 MG PO TABS
50.0000 mg | ORAL_TABLET | Freq: Every day | ORAL | 3 refills | Status: AC
Start: 2022-12-30 — End: 2023-12-30

## 2022-12-30 MED ORDER — ALBUTEROL SULFATE HFA 108 (90 BASE) MCG/ACT IN AERS: INHALATION_SPRAY | RESPIRATORY_TRACT | 2 refills | Status: DC

## 2022-12-30 MED ORDER — ROSUVASTATIN CALCIUM 20 MG PO TABS
20.0000 mg | ORAL_TABLET | Freq: Every day | ORAL | 3 refills | Status: AC
Start: 2022-12-30 — End: ?

## 2022-12-30 MED ORDER — DICLOFENAC SODIUM 1 % EX GEL: 4.0000 g | Freq: Four times a day (QID) | CUTANEOUS | 3 refills | Status: DC

## 2022-12-30 MED ORDER — HYDROCORTISONE (PERIANAL) 2.5 % EX CREA: TOPICAL_CREAM | Freq: Two times a day (BID) | CUTANEOUS | 3 refills | Status: AC

## 2022-12-30 MED ORDER — METFORMIN HCL ER 500 MG PO TB24
500.0000 mg | ORAL_TABLET | Freq: Two times a day (BID) | ORAL | 1 refills | Status: AC
Start: 2022-12-30 — End: ?

## 2023-06-27 NOTE — Progress Notes (Signed)
 [facility]: Clinic Note  Subjective:  History of Present Illness: Caroline [name] is a 78 y.o. year old female who presents for routine follow up of his chronic medical conditions  She is doing well today.   She has no new concerns or questions, just wanted to come in for medicine refills and her annual check up.   Please refer to Assessment and Plan below for full details in Problem-Based Charting.   Past Medical History:  Patient Active Problem List   Diagnosis Date Noted   Diabetes mellitus due to underlying condition, with diabetic microalbuminuria, without long-term current use of insulin (HCC) 06/28/2023   Other chronic pulmonary embolism without acute cor pulmonale (HCC) 08/31/2022   Vitamin D  deficiency 08/31/2022   Incontinence of urine in female 08/31/2022   Abnormal mammogram of left breast 08/31/2022   Positive fecal immunochemical test 07/28/2020   Diverticulitis 12/20/2018   GERD (gastroesophageal reflux disease) 01/30/2018   Depression 10/31/2017   Asthma, chronic, mild persistent, uncomplicated 08/29/2017   Type 2 diabetes mellitus with complication, without long-term current use of insulin (HCC) 06/02/2017   Obesity, Class III, BMI 40-49.9 (morbid obesity) 05/02/2016   Chronic combined systolic and diastolic CHF, NYHA class 1 (HCC) 09/21/2015   Vertigo 09/13/2011   Healthcare maintenance 09/13/2011   Allergic rhinitis 06/21/2006   Hyperlipidemia 06/10/2006   Essential hypertension 06/10/2006      Medications:  Current Outpatient Medications:    acetaminophen  (TYLENOL ) 500 MG tablet, Take 1 tablet (500 mg total) by mouth 4 (four) times daily as needed for mild pain (pain score 1-3) or headache., Disp: 90 tablet, Rfl: 3   albuterol  (VENTOLIN  HFA) 108 (90 Base) MCG/ACT inhaler, TAKE 2 PUFFS BY MOUTH EVERY 6 HOURS AS NEEDED FOR WHEEZE OR SHORTNESS OF BREATH, Disp: 8.5 each, Rfl: 2   budesonide -formoterol  (SYMBICORT ) 160-4.5 MCG/ACT inhaler,  Inhale 2 puffs into the lungs 2 (two) times daily., Disp: 1 each, Rfl: 11   carvedilol  (COREG ) 12.5 MG tablet, Take 1 tablet (12.5 mg total) by mouth 2 (two) times daily with a meal., Disp: 180 tablet, Rfl: 3   diclofenac  Sodium (VOLTAREN ) 1 % GEL, Apply 4 g topically 4 (four) times daily., Disp: 100 g, Rfl: 3   fluticasone  (FLONASE ) 50 MCG/ACT nasal spray, Place 1 spray into both nostrils daily., Disp: 48 mL, Rfl: 1   hydrochlorothiazide  (HYDRODIURIL ) 12.5 MG tablet, Take 1 tablet (12.5 mg total) by mouth daily., Disp: 90 tablet, Rfl: 3   hydrocortisone  (ANUSOL -HC) 2.5 % rectal cream, Place rectally 2 (two) times daily., Disp: 30 g, Rfl: 3   losartan  (COZAAR ) 50 MG tablet, Take 1 tablet (50 mg total) by mouth daily., Disp: 90 tablet, Rfl: 3   meclizine  (ANTIVERT ) 25 MG tablet, Take 1 tablet (25 mg total) by mouth 3 (three) times daily as needed., Disp: 90 tablet, Rfl: 5   metFORMIN  (GLUCOPHAGE -XR) 500 MG 24 hr tablet, Take 1 tablet (500 mg total) by mouth 2 (two) times daily with a meal., Disp: 180 tablet, Rfl: 1   montelukast  (SINGULAIR ) 10 MG tablet, TAKE 1 TABLET BY MOUTH EVERYDAY AT BEDTIME, Disp: 90 tablet, Rfl: 3   pantoprazole  (PROTONIX ) 20 MG tablet, Take 1 tablet (20 mg total) by mouth daily., Disp: 90 tablet, Rfl: 3   Psyllium (METAMUCIL PO), Take by mouth at bedtime. gummies, Disp: , Rfl:    RESTASIS  0.05 % ophthalmic emulsion, INSTILL 1 DROP INTO BOTH EYES TWICE A DAY, Disp: 60 mL, Rfl: 1  rivaroxaban  (XARELTO ) 10 MG TABS tablet, Take 1 tablet (10 mg total) by mouth daily., Disp: 90 tablet, Rfl: 3   rosuvastatin  (CRESTOR ) 20 MG tablet, Take 1 tablet (20 mg total) by mouth daily., Disp: 90 tablet, Rfl: 3   Allergies: Allergies  Allergen Reactions   Morphine  Sulfate Shortness Of Breath   Penicillins Anaphylaxis, Hives, Swelling and Rash    Has patient had a PCN reaction causing immediate rash, facial/tongue/throat swelling, SOB or lightheadedness with hypotension: Yes Has patient  had a PCN reaction causing severe rash involving mucus membranes or skin necrosis: Yes Has patient had a PCN reaction that required hospitalization Yes Has patient had a PCN reaction occurring within the last 10 years: No If all of the above answers are NO, then may proceed with Cephalosporin use.    Cabbage Itching   Keflex  [Cephalexin ] Hives    And feeling of throat tightness   Other Itching    Lettuce - itching   Shellfish Allergy Swelling   Tomato Swelling   Latex Itching and Rash       Objective:   Vitals: Vitals:   06/28/23 1006 06/28/23 1051  BP: (!) 143/73 (!) 146/72  Pulse:  68  Temp: 98.3 F (36.8 C)   SpO2: 99%      Physical Exam: Physical Exam Constitutional:      Appearance: Normal appearance.   Cardiovascular:     Pulses: Normal pulses.     Heart sounds: Normal heart sounds.  Pulmonary:     Effort: Pulmonary effort is normal.     Breath sounds: Normal breath sounds.   Musculoskeletal:     Right lower leg: No edema.     Left lower leg: No edema.   Neurological:     Mental Status: She is alert.   Psychiatric:        Mood and Affect: Mood normal.        Behavior: Behavior normal.      Data: Labs, imaging, and micro were reviewed in Epic. Refer to Assessment and Plan below for full details in Problem-Based Charting.  Assessment & Plan:  Hyperlipidemia - chronic and stable - lipid panel today - continue crestor  20mg  daily  Essential hypertension - Chronic and above goal - Continue losartan  50mg  daily, carvedilol  12.5mg  BID, & hydrochlorothiazide  12.5mg  daily  - I want to chose options that will also benefit her HFrEF. Final plan depends on labs.   Chronic combined systolic and diastolic CHF, NYHA class 1 (HCC) - chronic and stable - EF 40-45% in 03/3019 - Repeat TTE  Type 2 diabetes mellitus with complication, without long-term current use of insulin (HCC) - A1C 7.3 - Metformin  500mg  BID.  - Urine microalbumin today - Foot exam  done today - If still having proteinuria, I recommend adding Jardiance 10mg  daily. I think we'd increase Losartan  to 100, add Jardiance, and stop hydrochlorothiazide  in that case        Patient will follow up in 3 months for T2DM   Driscilla George, MD

## 2023-06-28 ENCOUNTER — Other Ambulatory Visit: Payer: Self-pay

## 2023-06-28 ENCOUNTER — Encounter: Payer: Self-pay | Admitting: Internal Medicine

## 2023-06-28 ENCOUNTER — Ambulatory Visit (INDEPENDENT_AMBULATORY_CARE_PROVIDER_SITE_OTHER): Payer: Self-pay | Admitting: Internal Medicine

## 2023-06-28 VITALS — BP 146/72 | HR 68 | Temp 98.3°F | Ht 62.0 in | Wt 265.2 lb

## 2023-06-28 DIAGNOSIS — J4521 Mild intermittent asthma with (acute) exacerbation: Secondary | ICD-10-CM

## 2023-06-28 DIAGNOSIS — E0829 Diabetes mellitus due to underlying condition with other diabetic kidney complication: Secondary | ICD-10-CM | POA: Insufficient documentation

## 2023-06-28 DIAGNOSIS — K219 Gastro-esophageal reflux disease without esophagitis: Secondary | ICD-10-CM

## 2023-06-28 DIAGNOSIS — I82409 Acute embolism and thrombosis of unspecified deep veins of unspecified lower extremity: Secondary | ICD-10-CM

## 2023-06-28 DIAGNOSIS — I110 Hypertensive heart disease with heart failure: Secondary | ICD-10-CM | POA: Diagnosis not present

## 2023-06-28 DIAGNOSIS — E7800 Pure hypercholesterolemia, unspecified: Secondary | ICD-10-CM

## 2023-06-28 DIAGNOSIS — E66813 Obesity, class 3: Secondary | ICD-10-CM

## 2023-06-28 DIAGNOSIS — I5042 Chronic combined systolic (congestive) and diastolic (congestive) heart failure: Secondary | ICD-10-CM

## 2023-06-28 DIAGNOSIS — E785 Hyperlipidemia, unspecified: Secondary | ICD-10-CM | POA: Diagnosis not present

## 2023-06-28 DIAGNOSIS — I10 Essential (primary) hypertension: Secondary | ICD-10-CM

## 2023-06-28 DIAGNOSIS — J4530 Mild persistent asthma, uncomplicated: Secondary | ICD-10-CM

## 2023-06-28 DIAGNOSIS — E118 Type 2 diabetes mellitus with unspecified complications: Secondary | ICD-10-CM | POA: Diagnosis not present

## 2023-06-28 LAB — POCT GLYCOSYLATED HEMOGLOBIN (HGB A1C): Hemoglobin A1C: 7.3 % — AB (ref 4.0–5.6)

## 2023-06-28 LAB — GLUCOSE, CAPILLARY: Glucose-Capillary: 147 mg/dL — ABNORMAL HIGH (ref 70–99)

## 2023-06-28 MED ORDER — BUDESONIDE-FORMOTEROL FUMARATE 160-4.5 MCG/ACT IN AERO: 2.0000 | INHALATION_SPRAY | Freq: Two times a day (BID) | RESPIRATORY_TRACT | 11 refills | Status: AC

## 2023-06-28 MED ORDER — MONTELUKAST SODIUM 10 MG PO TABS
ORAL_TABLET | ORAL | 3 refills | Status: AC
Start: 2023-06-28 — End: ?

## 2023-06-28 MED ORDER — FLUTICASONE PROPIONATE 50 MCG/ACT NA SUSP: 1.0000 | Freq: Every day | NASAL | 1 refills | Status: AC

## 2023-06-28 MED ORDER — DICLOFENAC SODIUM 1 % EX GEL
4.0000 g | Freq: Four times a day (QID) | CUTANEOUS | 3 refills | Status: AC
  Filled 2023-06-28: qty 100, 8d supply, fill #0

## 2023-06-28 MED ORDER — HYDROCHLOROTHIAZIDE 12.5 MG PO TABS: 12.5000 mg | ORAL_TABLET | Freq: Every day | ORAL | 3 refills | Status: AC

## 2023-06-28 MED ORDER — RIVAROXABAN 10 MG PO TABS
10.0000 mg | ORAL_TABLET | Freq: Every day | ORAL | 3 refills | Status: AC
Start: 2023-06-28 — End: 2024-06-27

## 2023-06-28 MED ORDER — ROSUVASTATIN CALCIUM 20 MG PO TABS: 20.0000 mg | ORAL_TABLET | Freq: Every day | ORAL | 3 refills | Status: AC

## 2023-06-28 MED ORDER — CARVEDILOL 12.5 MG PO TABS
12.5000 mg | ORAL_TABLET | Freq: Two times a day (BID) | ORAL | 3 refills | Status: AC
Start: 2023-06-28 — End: 2024-06-27

## 2023-06-28 MED ORDER — LOSARTAN POTASSIUM 50 MG PO TABS
50.0000 mg | ORAL_TABLET | Freq: Every day | ORAL | 3 refills | Status: AC
Start: 2023-06-28 — End: 2024-06-27

## 2023-06-28 MED ORDER — PANTOPRAZOLE SODIUM 20 MG PO TBEC
20.0000 mg | DELAYED_RELEASE_TABLET | Freq: Every day | ORAL | 3 refills | Status: AC
Start: 2023-06-28 — End: 2024-06-27

## 2023-06-28 MED ORDER — ALBUTEROL SULFATE HFA 108 (90 BASE) MCG/ACT IN AERS: INHALATION_SPRAY | RESPIRATORY_TRACT | 2 refills | Status: DC

## 2023-06-28 MED ORDER — METFORMIN HCL ER 500 MG PO TB24: 500.0000 mg | ORAL_TABLET | Freq: Two times a day (BID) | ORAL | 1 refills | Status: DC

## 2023-06-28 MED ORDER — ACETAMINOPHEN 500 MG PO TABS: 500.0000 mg | ORAL_TABLET | Freq: Four times a day (QID) | ORAL | 3 refills | Status: AC | PRN

## 2023-06-28 NOTE — Assessment & Plan Note (Signed)
-   chronic and stable - lipid panel today - continue crestor  20mg  daily

## 2023-06-28 NOTE — Assessment & Plan Note (Signed)
-   chronic and stable - EF 40-45% in 03/3019 - Repeat TTE

## 2023-06-28 NOTE — Patient Instructions (Signed)
 Thank you, Caroline [name] for allowing us  to provide your care today. Today we discussed your blood pressure, your diabetes, and your medicines.    I have ordered the following labs for you:   Lab Orders         BMP8+Anion Gap         Microalbumin / Creatinine Urine Ratio         Lipid Profile         POC Hbg A1C        Referrals ordered today:   Referral Orders  No referral(s) requested today     I have ordered the following medication/changed the following medications:  - No changes. All medicines refilled and sent to your pharmacy  Return in about 6 months (around 12/28/2023).    Remember:  - Call your cardiologist to schedule an appointment   Should you have any questions or concerns please call the internal medicine clinic at [phone].     Chauntay Paszkiewicz, MD Faculty, Internal Medicine Teaching Progam [facility]

## 2023-06-28 NOTE — Assessment & Plan Note (Signed)
-   Chronic and above goal - Continue losartan  50mg  daily, carvedilol  12.5mg  BID, & hydrochlorothiazide  12.5mg  daily  - I want to chose options that will also benefit her HFrEF. Final plan depends on labs.

## 2023-06-28 NOTE — Assessment & Plan Note (Signed)
-   A1C 7.3 - Metformin  500mg  BID.  - Urine microalbumin today - Foot exam done today - If still having proteinuria, I recommend adding Jardiance 10mg  daily. I think we'd increase Losartan  to 100, add Jardiance, and stop hydrochlorothiazide  in that case

## 2023-06-29 LAB — BMP8+ANION GAP
Anion Gap: 17 mmol/L (ref 10.0–18.0)
BUN/Creatinine Ratio: 20 (ref 12–28)
BUN: 17 mg/dL (ref 8–27)
CO2: 25 mmol/L (ref 20–29)
Calcium: 9.9 mg/dL (ref 8.7–10.3)
Chloride: 98 mmol/L (ref 96–106)
Creatinine, Ser: 0.84 mg/dL (ref 0.57–1.00)
Glucose: 149 mg/dL — ABNORMAL HIGH (ref 70–99)
Potassium: 4.2 mmol/L (ref 3.5–5.2)
Sodium: 140 mmol/L (ref 134–144)
eGFR: 72 mL/min/{1.73_m2} (ref >59)

## 2023-06-29 LAB — LIPID PANEL
Chol/HDL Ratio: 2.9 ratio (ref 0.0–4.4)
Cholesterol, Total: 152 mg/dL (ref 100–199)
HDL: 52 mg/dL (ref >39)
LDL Chol Calc (NIH): 80 mg/dL (ref 0–99)
Triglycerides: 110 mg/dL (ref 0–149)
VLDL Cholesterol Cal: 20 mg/dL (ref 5–40)

## 2023-07-01 LAB — MICROALBUMIN / CREATININE URINE RATIO
Creatinine, Urine: 77.5 mg/dL
Microalb/Creat Ratio: 32 mg/g{creat} — ABNORMAL HIGH (ref 0–29)
Microalbumin, Urine: 24.9 ug/mL

## 2023-07-05 ENCOUNTER — Ambulatory Visit: Payer: Self-pay | Admitting: Internal Medicine

## 2023-07-05 MED ORDER — EMPAGLIFLOZIN 10 MG PO TABS: 10.0000 mg | ORAL_TABLET | Freq: Every day | ORAL | 3 refills | Status: AC

## 2023-07-24 ENCOUNTER — Encounter: Payer: Self-pay | Admitting: *Deleted

## 2023-08-09 ENCOUNTER — Ambulatory Visit (HOSPITAL_COMMUNITY)
Admission: RE | Admit: 2023-08-09 | Discharge: 2023-08-09 | Disposition: A | Discharge status: Home / Self Care | Source: Ambulatory Visit | Attending: Internal Medicine | Admitting: Internal Medicine

## 2023-08-09 ENCOUNTER — Ambulatory Visit: Payer: 59

## 2023-08-09 VITALS — Ht 62.0 in | Wt 263.0 lb

## 2023-08-09 DIAGNOSIS — E119 Type 2 diabetes mellitus without complications: Secondary | ICD-10-CM | POA: Diagnosis not present

## 2023-08-09 DIAGNOSIS — I110 Hypertensive heart disease with heart failure: Secondary | ICD-10-CM | POA: Diagnosis not present

## 2023-08-09 DIAGNOSIS — E785 Hyperlipidemia, unspecified: Secondary | ICD-10-CM | POA: Insufficient documentation

## 2023-08-09 DIAGNOSIS — I080 Rheumatic disorders of both mitral and aortic valves: Secondary | ICD-10-CM | POA: Insufficient documentation

## 2023-08-09 DIAGNOSIS — I5042 Chronic combined systolic (congestive) and diastolic (congestive) heart failure: Secondary | ICD-10-CM | POA: Insufficient documentation

## 2023-08-09 DIAGNOSIS — Z0000 Encounter for general adult medical examination without abnormal findings: Secondary | ICD-10-CM

## 2023-08-09 LAB — ECHOCARDIOGRAM COMPLETE
AR max vel: 2.49 cm2
AV Area VTI: 2.73 cm2
AV Area mean vel: 2.18 cm2
AV Mean grad: 3.2 mmHg
AV Peak grad: 4.9 mmHg
Ao pk vel: 1.11 m/s
Area-P 1/2: 3.48 cm2
Est EF: 55
Height: 62 in
S' Lateral: 2.9 cm
Single Plane A2C EF: 52.6 %
Weight: 4208 [oz_av]

## 2023-08-09 NOTE — Patient Instructions (Signed)
 Caroline [name] , Thank you for taking time out of your busy schedule to complete your Annual Wellness Visit with me. I enjoyed our conversation and look forward to speaking with you again next year. I, as well as your care team,  appreciate your ongoing commitment to your health goals. Please review the following plan we discussed and let me know if I can assist you in the future. Your Game plan/ To Do List    Referrals: If you haven't heard from the office you've been referred to, please reach out to them at the phone provided.   Follow up Visits: Next Medicare AWV with our clinical staff: 08/14/2024 at 1:00 p.m. phone visit with Nurse Health Advisor   Have you seen your provider in the last 6 months (3 months if uncontrolled diabetes)? Yes Next Office Visit with your provider: 08/10/2023 at 10:15 a.m. phone visit with Dr. Sallyanne Primas  Clinician Recommendations:  Aim for 30 minutes of exercise or brisk walking, 6-8 glasses of water , and 5 servings of fruits and vegetables each day.       This is a list of the screening recommended for you and due dates:  Health Maintenance  Topic Date Due   Zoster (Shingles) Vaccine (1 of 2) Never done   Eye exam for diabetics  10/14/2022   Flu Shot  08/11/2023   Hemoglobin A1C  09/28/2023   Yearly kidney function blood test for diabetes  06/27/2024   Yearly kidney health urinalysis for diabetes  06/27/2024   Complete foot exam   06/27/2024   Medicare Annual Wellness Visit  08/08/2024   DTaP/Tdap/Td vaccine (3 - Td or Tdap) 01/31/2028   Pneumococcal Vaccine for age over 35  Completed   DEXA scan (bone density measurement)  Completed   Hepatitis C Screening  Completed   Hepatitis B Vaccine  Aged Out   HPV Vaccine  Aged Out   Meningitis B Vaccine  Aged Out   COVID-19 Vaccine  Discontinued    Advanced directives: (Declined) Advance directive discussed with you today. Even though you declined this today, please call our office should you change your mind,  and we can give you the proper paperwork for you to fill out. Advance Care Planning is important because it:  [x]  Makes sure you receive the medical care that is consistent with your values, goals, and preferences  [x]  It provides guidance to your family and loved ones and reduces their decisional burden about whether or not they are making the right decisions based on your wishes.  Follow the link provided in your after visit summary or read over the paperwork we have mailed to you to help you started getting your Advance Directives in place. If you need assistance in completing these, please reach out to us  so that we can help you!  See attachments for Preventive Care and Fall Prevention Tips.

## 2023-08-09 NOTE — Progress Notes (Signed)
 Because this visit was a virtual/telehealth visit,  certain criteria was not obtained, such a blood pressure, CBG if applicable, and timed get up and go. Any medications not marked as taking were not mentioned during the medication reconciliation part of the visit. Any vitals not documented were not able to be obtained due to this being a telehealth visit or patient was unable to self-report a recent blood pressure reading due to a lack of equipment at home via telehealth. Vitals that have been documented are verbally provided by the patient.   Subjective:   Caroline [name] is a 78 y.o. who presents for a Medicare Wellness preventive visit.  As a reminder, Annual Wellness Visits don't include a physical exam, and some assessments may be limited, especially if this visit is performed virtually. We may recommend an in-person follow-up visit with your provider if needed.  Visit Complete: Virtual I connected with  Caroline [name] on 08/09/23 by a audio enabled telemedicine application and verified that I am speaking with the correct person using two identifiers.  Patient Location: Other:  car  Provider Location: Home Office  I discussed the limitations of evaluation and management by telemedicine. The patient expressed understanding and agreed to proceed.  Vital Signs: Because this visit was a virtual/telehealth visit, some criteria may be missing or patient reported. Any vitals not documented were not able to be obtained and vitals that have been documented are patient reported.  VideoDeclined- This patient declined Librarian, academic. Therefore the visit was completed with audio only.  Persons Participating in Visit: Patient.  AWV Questionnaire: No: Patient Medicare AWV questionnaire was not completed prior to this visit.  Cardiac Risk Factors include: advanced age (>24men, >65 women);diabetes mellitus;dyslipidemia;family history of premature cardiovascular  disease;hypertension;obesity (BMI >30kg/m2);sedentary lifestyle     Objective:    Today's Vitals   08/09/23 1304  Weight: 263 lb (119.3 kg)  Height: 5' 2 (1.575 m)  PainSc: 0-No pain   Body mass index is 48.1 kg/m.     08/09/2023    1:06 PM 08/03/2022    2:38 PM 03/23/2022   10:04 AM 12/14/2021   11:39 AM 08/25/2021    5:30 PM 07/24/2021    1:00 AM 07/23/2021   11:56 AM  Advanced Directives  Does Patient Have a Medical Advance Directive? No Yes No No No No No  Type of Furniture conservator/restorer;Living will       Copy of Healthcare Power of Attorney in Chart?  No - copy requested       Would patient like information on creating a medical advance directive? No - Patient declined  No - Patient declined No - Patient declined No - Patient declined No - Patient declined     Current Medications (verified) Outpatient Encounter Medications as of 08/09/2023  Medication Sig   acetaminophen  (TYLENOL ) 500 MG tablet Take 1 tablet (500 mg total) by mouth 4 (four) times daily as needed for mild pain (pain score 1-3) or headache.   albuterol  (VENTOLIN  HFA) 108 (90 Base) MCG/ACT inhaler TAKE 2 PUFFS BY MOUTH EVERY 6 HOURS AS NEEDED FOR WHEEZE OR SHORTNESS OF BREATH   budesonide -formoterol  (SYMBICORT ) 160-4.5 MCG/ACT inhaler Inhale 2 puffs into the lungs 2 (two) times daily.   carvedilol  (COREG ) 12.5 MG tablet Take 1 tablet (12.5 mg total) by mouth 2 (two) times daily with a meal.   diclofenac  Sodium (VOLTAREN ) 1 % GEL Apply 4 g topically 4 (four) times daily.  empagliflozin  (JARDIANCE ) 10 MG TABS tablet Take 1 tablet (10 mg total) by mouth daily.   fluticasone  (FLONASE ) 50 MCG/ACT nasal spray Place 1 spray into both nostrils daily.   hydrochlorothiazide  (HYDRODIURIL ) 12.5 MG tablet Take 1 tablet (12.5 mg total) by mouth daily.   hydrocortisone  (ANUSOL -HC) 2.5 % rectal cream Place rectally 2 (two) times daily.   losartan  (COZAAR ) 50 MG tablet Take 1 tablet (50 mg total) by mouth  daily.   meclizine  (ANTIVERT ) 25 MG tablet Take 1 tablet (25 mg total) by mouth 3 (three) times daily as needed.   metFORMIN  (GLUCOPHAGE -XR) 500 MG 24 hr tablet Take 1 tablet (500 mg total) by mouth 2 (two) times daily with a meal.   montelukast  (SINGULAIR ) 10 MG tablet TAKE 1 TABLET BY MOUTH EVERYDAY AT BEDTIME   pantoprazole  (PROTONIX ) 20 MG tablet Take 1 tablet (20 mg total) by mouth daily.   Psyllium (METAMUCIL PO) Take by mouth at bedtime. gummies   RESTASIS  0.05 % ophthalmic emulsion INSTILL 1 DROP INTO BOTH EYES TWICE A DAY   rivaroxaban  (XARELTO ) 10 MG TABS tablet Take 1 tablet (10 mg total) by mouth daily.   rosuvastatin  (CRESTOR ) 20 MG tablet Take 1 tablet (20 mg total) by mouth daily.   No facility-administered encounter medications on file as of 08/09/2023.    Allergies (verified) Morphine  sulfate, Penicillins, Cabbage, Keflex  [cephalexin ], Other, Shellfish allergy, Tomato, and Latex   History: Past Medical History:  Diagnosis Date   Anticoagulated on warfarin    Arthritis    Asthma    Bronchitis    CHF (congestive heart failure) (HCC)    Diabetes (HCC) 2019   Diverticulitis    DVT, lower extremity, recurrent (HCC)    Patient had unprovoked PE on 2002 and DVT in right lower extremety 2008.   Elevated INR    Frequent urination 03/21/2019   GERD (gastroesophageal reflux disease)    Hematuria, microscopic 09/13/2017   History of Clostridium difficile colitis 12/31/2016   Admitted 12/31/16 with C.diff colitis following several rounds of PO Clindamycin  for cellulitis. Responded well to PO Vancomycin .     History of kidney stones    Hx of bee sting allergy 10/31/2017   Hypertension    Hypokalemia 05/01/2017   Lipodermatosclerosis 10/11/2016   Lower abdominal pain 02/20/2018   Orthostatic hypotension    PE (pulmonary embolism)    Patient had unprovoked PE on 2002   PONV (postoperative nausea and vomiting)    Sinus infection 10/23/2021   Vertigo    Past Surgical  History:  Procedure Laterality Date   ABDOMINAL HYSTERECTOMY     1980   CYSTOSCOPY W/ URETERAL STENT PLACEMENT Right 09/16/2015   Procedure: CYSTOSCOPY WITH RETROGRADE PYELOGRAM/URETERAL STENT PLACEMENT;  Surgeon: Glendia Elizabeth, MD;  Location: [facility];  Service: Urology;  Laterality: Right;   CYSTOSCOPY WITH RETROGRADE PYELOGRAM, URETEROSCOPY AND STENT PLACEMENT Right 12/11/2015   Procedure: RIGHT URETEROSCOPY/HOLMIUM LASER LITHOTRIPSY AND STONE REMOVAL removal and placement of double j stent;  Surgeon: Morene LELON Salines, MD;  Location: [facility];  Service: Urology;  Laterality: Right;   HOLMIUM LASER APPLICATION Right 12/11/2015   Procedure: HOLMIUM LASER APPLICATION;  Surgeon: Morene LELON Salines, MD;  Location: [facility];  Service: Urology;  Laterality: Right;   Family History  Problem Relation Age of Onset   Cancer Mother    Hypertension Sister    Diabetes Sister    Hypertension Brother    Diabetes Brother    Hypertension Sister    Diabetes Sister  Colon cancer Other    Heart Problems Other 34       sister's child, open heart surgery   CVA Father    Diabetes Son    Hypertension Son    Kidney disease Son        on dialysis   Hypertension Sister    Diabetes Sister    Hypertension Sister    Diabetes Sister    Cancer Brother    Hypertension Son    Diabetes Son    Hypertension Daughter    Schizophrenia Daughter    Social History   Socioeconomic History   Marital status: Divorced    Spouse name: Not on file   Number of children: 5   Years of education: 9 th grade   Highest education level: 9th grade  Occupational History   Occupation: retired  Tobacco Use   Smoking status: Former    Current packs/day: 0.00    Types: Cigarettes    Quit date: 09/18/1988    Years since quitting: 34.9   Smokeless tobacco: Never   Tobacco comments:    6 pack year smoking history as a teen  Vaping Use   Vaping status: Never Used  Substance and Sexual Activity   Alcohol use: No   Drug use:  No   Sexual activity: Not Currently    Birth control/protection: None  Other Topics Concern   Not on file  Social History Narrative   Current Social History 08/28/2019   Patient lives with 43 yo granddaughter in a one story home. There are not steps up to the entrance the patient uses.       Patient's method of transportation is church member.      The highest level of education was 9th grade      The patient currently retired.      Identified important Relationships are God, family       Pets : 1 lab/pitt mix named Cinnamon       Interests / Fun: Church,TV       Current Stressors: Me and my granddaughter       Religious / Personal Beliefs: I'm Holiness and I love people       Other: I'd give away my last dime.       FREDRIK Alcide, BSN, RN-BC    Social Drivers of Health   Financial Resource Strain: Low Risk  (08/09/2023)   Overall Financial Resource Strain (CARDIA)    Difficulty of Paying Living Expenses: Not very hard  Food Insecurity: No Food Insecurity (08/09/2023)   Hunger Vital Sign    Worried About Running Out of Food in the Last Year: Never true    Ran Out of Food in the Last Year: Never true  Transportation Needs: No Transportation Needs (08/09/2023)   PRAPARE - Administrator, Civil Service (Medical): No    Lack of Transportation (Non-Medical): No  Physical Activity: Inactive (08/09/2023)   Exercise Vital Sign    Days of Exercise per Week: 0 days    Minutes of Exercise per Session: 0 min  Stress: No Stress Concern Present (08/09/2023)   [employer] of Occupational Health - Occupational Stress Questionnaire    Feeling of Stress: Not at all  Social Connections: Moderately Isolated (08/09/2023)   Social Connection and Isolation Panel    Frequency of Communication with Friends and Family: More than three times a week    Frequency of Social Gatherings with Friends and Family: Twice a week  Attends Religious Services: 1 to 4 times per year     Active Member of Clubs or Organizations: No    Attends Banker Meetings: Never    Marital Status: Divorced    Tobacco Counseling Counseling given: Not Answered Tobacco comments: 6 pack year smoking history as a teen    Clinical Intake:  Pre-visit preparation completed: Yes  Pain : No/denies pain Pain Score: 0-No pain     BMI - recorded: 48.1 Nutritional Status: BMI > 30  Obese Nutritional Risks: None Diabetes: Yes CBG done?: No Did pt. bring in CBG monitor from home?: No  Lab Results  Component Value Date   HGBA1C 7.3 (A) 06/28/2023   HGBA1C 6.8 (A) 08/31/2022   HGBA1C 6.5 (A) 03/23/2022     How often do you need to have someone help you when you read instructions, pamphlets, or other written materials from your doctor or pharmacy?: 1 - Never What is the last grade level you completed in school?: 9th  Interpreter Needed?: No  Information entered by :: Chazlyn Cude N. Shirlee Whitmire, LPN.   Activities of Daily Living     08/09/2023    1:08 PM  In your present state of health, do you have any difficulty performing the following activities:  Hearing? 0  Vision? 0  Difficulty concentrating or making decisions? 0  Walking or climbing stairs? 0  Dressing or bathing? 0  Doing errands, shopping? 0  Preparing Food and eating ? N  Using the Toilet? N  In the past six months, have you accidently leaked urine? Y  Do you have problems with loss of bowel control? N  Managing your Medications? N  Managing your Finances? N  Housekeeping or managing your Housekeeping? N    Patient Care Team: Shawn Sick, MD as PCP - General Inocencio, Soyla Lunger, MD as PCP - Cardiology (Cardiology) My [place], [place] as Consulting Physician (Optometry) Ruthell Lynwood NOVAK, RPH-CPP (Pharmacist) Plyler, Arland HERO, RD as Dietitian (Dietician)  I have updated your Care Teams any recent Medical Services you may have received from other providers in the past year.     Assessment:   This is a  routine wellness examination for Caroline [name].  Hearing/Vision screen Hearing Screening - Comments:: Denies hearing difficulties.   Vision Screening - Comments:: Wears rx glasses - up to date with routine eye exams with My China Le, OD.    Goals Addressed             This Visit's Progress    08/09/2023: My goal is to buy a elliptical for my home and start an exercise regimen.         Depression Screen     08/09/2023    1:09 PM 06/28/2023   10:56 AM 08/03/2022    2:49 PM 01/14/2022   11:23 AM 12/14/2021   11:33 AM 05/18/2021   12:03 PM 03/01/2021   11:08 AM  PHQ 2/9 Scores  PHQ - 2 Score 0 0 0 0 2 0 0  PHQ- 9 Score 0 0 0 0 9      Fall Risk     08/09/2023    1:06 PM 08/03/2022    2:40 PM 03/23/2022   10:02 AM 01/14/2022   11:22 AM 12/14/2021   11:33 AM  Fall Risk   Falls in the past year? 0 1 1 1  0  Comment     patient had a fall this year 2023  Number falls in past yr: 0 0 0 1  0  Injury with Fall? 0 1 0 1 1  Risk for fall due to : No Fall Risks      Follow up Falls evaluation completed Education provided;Falls prevention discussed Falls evaluation completed Falls evaluation completed  Falls evaluation completed      Data saved with a previous flowsheet row definition    MEDICARE RISK AT HOME:  Medicare Risk at Home Any stairs in or around the home?: No If so, are there any without handrails?: No Home free of loose throw rugs in walkways, pet beds, electrical cords, etc?: Yes Adequate lighting in your home to reduce risk of falls?: Yes Life alert?: No Use of a cane, walker or w/c?: Yes Grab bars in the bathroom?: No Shower chair or bench in shower?: No Elevated toilet seat or a handicapped toilet?: No  TIMED UP AND GO:  Was the test performed?  No  Cognitive Function: Declined/Normal: No cognitive concerns noted by patient or family. Patient alert, oriented, able to answer questions appropriately and recall recent events. No signs of memory loss or confusion.    08/09/2023     1:09 PM  MMSE - Mini Mental State Exam  Not completed: Unable to complete        08/09/2023    1:06 PM 08/03/2022    2:43 PM  6CIT Screen  What Year? 0 points 0 points  What month? 0 points 0 points  What time? 0 points 0 points  Count back from 20 0 points 0 points  Months in reverse 0 points 0 points  Repeat phrase 0 points 2 points  Total Score 0 points 2 points    Immunizations Immunization History  Administered Date(s) Administered   Fluad Quad(high Dose 65+) 10/28/2019, 09/21/2020, 10/11/2021   Influenza Split 10/24/2011   Influenza Whole 11/07/2005   Influenza,inj,Quad PF,6+ Mos 11/20/2012, 10/14/2013, 11/03/2014, 11/09/2015, 09/26/2016, 08/29/2017, 09/24/2018   PPD Test 09/23/2015   Pneumococcal Conjugate-13 12/07/2015   Pneumococcal Polysaccharide-23 08/11/2003, 05/29/2012   Tdap 01/30/2018, 01/30/2018    Screening Tests Health Maintenance  Topic Date Due   Zoster Vaccines- Shingrix (1 of 2) Never done   OPHTHALMOLOGY EXAM  10/14/2022   INFLUENZA VACCINE  08/11/2023   HEMOGLOBIN A1C  09/28/2023   Diabetic kidney evaluation - eGFR measurement  06/27/2024   Diabetic kidney evaluation - Urine ACR  06/27/2024   FOOT EXAM  06/27/2024   Medicare Annual Wellness (AWV)  08/08/2024   DTaP/Tdap/Td (3 - Td or Tdap) 01/31/2028   Pneumococcal Vaccine: 50+ Years  Completed   DEXA SCAN  Completed   Hepatitis C Screening  Completed   Hepatitis B Vaccines  Aged Out   HPV VACCINES  Aged Out   Meningococcal B Vaccine  Aged Out   COVID-19 Vaccine  Discontinued    Health Maintenance  Health Maintenance Due  Topic Date Due   Zoster Vaccines- Shingrix (1 of 2) Never done   OPHTHALMOLOGY EXAM  10/14/2022   Health Maintenance Items Addressed: Yes Patient aware of current care gaps.  Patient declined vaccines.  Additional Screening:  Vision Screening: Recommended annual ophthalmology exams for early detection of glaucoma and other disorders of the eye. Would you  like a referral to an eye doctor? No    Dental Screening: Recommended annual dental exams for proper oral hygiene  Community Resource Referral / Chronic Care Management: CRR required this visit?  No   CCM required this visit?  No   Plan:    I have personally reviewed and  noted the following in the patient's chart:   Medical and social history Use of alcohol, tobacco or illicit drugs  Current medications and supplements including opioid prescriptions. Patient is not currently taking opioid prescriptions. Functional ability and status Nutritional status Physical activity Advanced directives List of other physicians Hospitalizations, surgeries, and ER visits in previous 12 months Vitals Screenings to include cognitive, depression, and falls Referrals and appointments  In addition, I have reviewed and discussed with patient certain preventive protocols, quality metrics, and best practice recommendations. A written personalized care plan for preventive services as well as general preventive health recommendations were provided to patient.   Roz LOISE Fuller, LPN   [id number]/[id number]   After Visit Summary: (Declined) Due to this being a telephonic visit, with patients personalized plan was offered to patient but patient Declined AVS at this time   Notes: This nurse will request records from Optometrist.

## 2023-08-09 NOTE — Progress Notes (Signed)
  Echocardiogram 2D Echocardiogram has been performed.  Caroline [name] 08/09/2023, 2:29 PM

## 2023-08-10 ENCOUNTER — Telehealth

## 2023-08-10 ENCOUNTER — Ambulatory Visit: Admitting: Student

## 2023-08-10 DIAGNOSIS — R32 Unspecified urinary incontinence: Secondary | ICD-10-CM

## 2023-08-10 NOTE — Progress Notes (Unsigned)
   CC: renew incontinence supplies  This is a telephone encounter between Veera N Spallone and Rhylan Gross on 08/11/2023 for renewal of incontinence supplies.  The visit was conducted with the patient located at home and Missy Sandhoff at [facility]. The patient's identity was confirmed using their DOB and current address. The patient has consented to being evaluated through a telephone encounter and understands the associated risks (an examination cannot be done and the patient may need to come in for an appointment) / benefits (allows the patient to remain at home, decreasing exposure to coronavirus). I personally spent 10 minutes on medical discussion.   HPI:  [name] is a 78 y.o. with PMH as below.   This is an audio only telehealth visit, this is due to patient preference.  Patient wants to discuss renewal of a incontinence supplies.  Please see A&P for assessment of the patient's acute and chronic medical conditions.   Past Medical History:  Diagnosis Date   Anticoagulated on warfarin    Arthritis    Asthma    Bronchitis    CHF (congestive heart failure) (HCC)    Diabetes (HCC) 2019   Diverticulitis    DVT, lower extremity, recurrent (HCC)    Patient had unprovoked PE on 2002 and DVT in right lower extremety 2008.   Elevated INR    Frequent urination 03/21/2019   GERD (gastroesophageal reflux disease)    Hematuria, microscopic 09/13/2017   History of Clostridium difficile colitis 12/31/2016   Admitted 12/31/16 with C.diff colitis following several rounds of PO Clindamycin  for cellulitis. Responded well to PO Vancomycin .     History of kidney stones    Hx of bee sting allergy 10/31/2017   Hypertension    Hypokalemia 05/01/2017   Lipodermatosclerosis 10/11/2016   Lower abdominal pain 02/20/2018   Orthostatic hypotension    PE (pulmonary embolism)    Patient had unprovoked PE on 2002   PONV (postoperative nausea and vomiting)    Sinus infection 10/23/2021   Vertigo     Review of Systems:  negative unless stated in HPI    Assessment & Plan:   Incontinence of urine in female Patient has urge incontinence and requires incontinence supplies. Requesting that wipes be added to the approved supply list.    Patient discussed with Dr. CHARLENA Rosan Missy Sandhoff, MD  Internal Medicine Resident

## 2023-08-11 ENCOUNTER — Encounter: Payer: Self-pay | Admitting: Student

## 2023-08-11 NOTE — Assessment & Plan Note (Signed)
 Patient has urge incontinence and requires incontinence supplies. Requesting that wipes be added to the approved supply list.

## 2023-08-11 NOTE — Progress Notes (Signed)
 I reviewed the AWV findings of the licensed medical professional who conducted the visit. I was present in the office suite and immediately available to provide assistance and direction throughout the time the service was provided.

## 2023-08-11 NOTE — Progress Notes (Signed)
 Internal Medicine Attending:  I reviewed the AWV findings of the medical professional who conducted the visit. I was present in the office suite and immediately available to provide assistance and direction throughout the time the service was provided.

## 2023-08-17 NOTE — Progress Notes (Signed)
 Internal Medicine Clinic Attending  Case discussed with the resident at the time of the visit.  We reviewed the resident's history and exam and pertinent patient test results.  I agree with the assessment, diagnosis, and plan of care documented in the resident's note.

## 2023-09-06 ENCOUNTER — Encounter: Payer: Self-pay | Admitting: Student

## 2023-09-06 ENCOUNTER — Ambulatory Visit: Admitting: Student

## 2023-09-06 VITALS — BP 118/87 | HR 69 | Temp 98.2°F | Ht 62.0 in | Wt 253.2 lb

## 2023-09-06 DIAGNOSIS — Z7984 Long term (current) use of oral hypoglycemic drugs: Secondary | ICD-10-CM | POA: Diagnosis not present

## 2023-09-06 DIAGNOSIS — E118 Type 2 diabetes mellitus with unspecified complications: Secondary | ICD-10-CM | POA: Diagnosis not present

## 2023-09-06 NOTE — Patient Instructions (Addendum)
 Thank you, [name] for allowing us  to provide your care today. Today we discussed your diabetes. We are glad that you were able to make some dietary changes and have some weight loss! We would recommend coming back in 1 month to check your blood sugar.   My Chart Access: [email or web address]  Please follow-up in: 1 month    We look forward to seeing you next time. Please call our clinic at [phone] if you have any questions or concerns. The best time to call is Monday-Friday from 9am-4pm, but there is someone available 24/7. If after hours or the weekend, call the main hospital number and ask for the Internal Medicine Resident On-Call. If you need medication refills, please notify your pharmacy one week in advance and they will send us  a request.   Thank you for letting us  take part in your care. Wishing you the best!

## 2023-09-06 NOTE — Assessment & Plan Note (Addendum)
 Patient has been taking metformin  500mg  BID but has not been taking jardiance  10mg . She feels that the elevated blood sugar she had at her last visit was due to her increased consumption of sweets which she has since stopped. She has also lost 10 lbs in the last month through intentional dietary changes. She is not interested in adjusting her medication regimen at this time from metformin  500mg  BID. She does not take her blood sugar at home, and has noticed some polyuria with waking up 4-5 times to pee. She denies polyphagia, polydipsia, or neuropathy of the feet. She has intermittent neuropathy of the hands. Her last opthalmology appointment was in July 2025 and was normal. Insurance will not cover her A1c if taken before the 3 month mark, so she will return in 1 month to have her A1c checked.  - metformin  500 BID - follow up in 1 month

## 2023-09-06 NOTE — Progress Notes (Signed)
 This is a Psychologist, occupational Note.  The care of the patient was discussed with Dr. Tobie and the assessment and plan was formulated with their assistance.  Please see their note for official documentation of the patient encounter.   Subjective:   Patient ID: Caroline [name] female   DOB: [birth year] 78 y.o.   MRN: [id number]  HPI: Caroline [name] is a 78 y.o. with PMH HLD, HTN, T2DM, GERD who is here for a diabetes follow up. She is currently taking metformin  500mg  BID, Losartan  50mg , Hydrochlorothiazide  12.5mg , Carvedilol  12.5mg , and rosuvastatin  20mg  daily as prescribed.  For the details of today's visit, please refer to the assessment and plan.     Past Medical History:  Diagnosis Date   Anticoagulated on warfarin    Arthritis    Asthma    Bronchitis    CHF (congestive heart failure) (HCC)    Diabetes (HCC) 2019   Diverticulitis    DVT, lower extremity, recurrent (HCC)    Patient had unprovoked PE on 2002 and DVT in right lower extremety 2008.   Elevated INR    Frequent urination 03/21/2019   GERD (gastroesophageal reflux disease)    Hematuria, microscopic 09/13/2017   History of Clostridium difficile colitis 12/31/2016   Admitted 12/31/16 with C.diff colitis following several rounds of PO Clindamycin  for cellulitis. Responded well to PO Vancomycin .     History of kidney stones    Hx of bee sting allergy 10/31/2017   Hypertension    Hypokalemia 05/01/2017   Lipodermatosclerosis 10/11/2016   Lower abdominal pain 02/20/2018   Orthostatic hypotension    PE (pulmonary embolism)    Patient had unprovoked PE on 2002   PONV (postoperative nausea and vomiting)    Sinus infection 10/23/2021   Vertigo    Current Outpatient Medications  Medication Sig Dispense Refill   acetaminophen  (TYLENOL ) 500 MG tablet Take 1 tablet (500 mg total) by mouth 4 (four) times daily as needed for mild pain (pain score 1-3) or headache. 90 tablet 3   albuterol  (VENTOLIN  HFA) 108 (90 Base) MCG/ACT  inhaler TAKE 2 PUFFS BY MOUTH EVERY 6 HOURS AS NEEDED FOR WHEEZE OR SHORTNESS OF BREATH 8.5 each 2   budesonide -formoterol  (SYMBICORT ) 160-4.5 MCG/ACT inhaler Inhale 2 puffs into the lungs 2 (two) times daily. 1 each 11   carvedilol  (COREG ) 12.5 MG tablet Take 1 tablet (12.5 mg total) by mouth 2 (two) times daily with a meal. 180 tablet 3   diclofenac  Sodium (VOLTAREN ) 1 % GEL Apply 4 g topically 4 (four) times daily. 100 g 3   empagliflozin  (JARDIANCE ) 10 MG TABS tablet Take 1 tablet (10 mg total) by mouth daily. 90 tablet 3   fluticasone  (FLONASE ) 50 MCG/ACT nasal spray Place 1 spray into both nostrils daily. 48 mL 1   hydrochlorothiazide  (HYDRODIURIL ) 12.5 MG tablet Take 1 tablet (12.5 mg total) by mouth daily. 90 tablet 3   hydrocortisone  (ANUSOL -HC) 2.5 % rectal cream Place rectally 2 (two) times daily. 30 g 3   losartan  (COZAAR ) 50 MG tablet Take 1 tablet (50 mg total) by mouth daily. 90 tablet 3   meclizine  (ANTIVERT ) 25 MG tablet Take 1 tablet (25 mg total) by mouth 3 (three) times daily as needed. 90 tablet 5   metFORMIN  (GLUCOPHAGE -XR) 500 MG 24 hr tablet Take 1 tablet (500 mg total) by mouth 2 (two) times daily with a meal. 180 tablet 1   montelukast  (SINGULAIR ) 10 MG tablet TAKE 1 TABLET BY MOUTH EVERYDAY AT  BEDTIME 90 tablet 3   pantoprazole  (PROTONIX ) 20 MG tablet Take 1 tablet (20 mg total) by mouth daily. 90 tablet 3   Psyllium (METAMUCIL PO) Take by mouth at bedtime. gummies     RESTASIS  0.05 % ophthalmic emulsion INSTILL 1 DROP INTO BOTH EYES TWICE A DAY 60 mL 1   rivaroxaban  (XARELTO ) 10 MG TABS tablet Take 1 tablet (10 mg total) by mouth daily. 90 tablet 3   rosuvastatin  (CRESTOR ) 20 MG tablet Take 1 tablet (20 mg total) by mouth daily. 90 tablet 3   No current facility-administered medications for this visit.    Review of Systems: Pertinent items are noted in HPI. Objective:  Physical Exam: Vitals:   09/06/23 1315 09/06/23 1323  BP: (!) 146/84 118/87  Pulse: 80 69   Temp: 98.2 F (36.8 C)   TempSrc: Oral   SpO2: 94%   Weight: 253 lb 3.2 oz (114.9 kg)   Height: 5' 2 (1.575 m)     Constitutional: NAD, appears comfortable Cardiovascular: RRR, no murmurs, rubs, or gallops.  Pulmonary/Chest: CTAB, no wheezes, rales, or rhonchi.   Extremities: Slight edema of lower extremities. Warm and well perfused. Normal bilateral foot sensation with monofilament. Skin: No rashes or erythema  Psychiatric: Normal mood and affect  Assessment & Plan:   Type 2 diabetes mellitus with complication, without long-term current use of insulin (HCC) Patient has been taking metformin  500mg  BID but has not been taking jardiance  10mg . She feels that the elevated blood sugar she had at her last visit was due to her increased consumption of sweets which she has since stopped. She has also lost 10 lbs in the last month through intentional dietary changes. She is not interested in adjusting her medication regimen at this time from metformin  500mg  BID. She does not take her blood sugar at home, and has noticed some polyuria with waking up 4-5 times to pee. She denies polyphagia, polydipsia, or neuropathy of the feet. She has intermittent neuropathy of the hands. Her last opthalmology appointment was in July 2025 and was normal. Insurance will not cover her A1c if taken before the 3 month mark, so she will return in 1 month to have her A1c checked.  - metformin  500 BID - follow up in 1 month

## 2023-09-15 NOTE — Progress Notes (Signed)
 Internal Medicine Clinic Attending  Case discussed with the resident at the time of the visit.  I reviewed the student and resident's history and exam and pertinent patient test results.  I agree with the assessment, diagnosis, and plan of care documented in the student's note.

## 2023-10-04 ENCOUNTER — Encounter

## 2023-10-25 ENCOUNTER — Encounter: Payer: Self-pay | Admitting: Student

## 2023-10-25 ENCOUNTER — Ambulatory Visit (INDEPENDENT_AMBULATORY_CARE_PROVIDER_SITE_OTHER): Admitting: Student

## 2023-10-25 VITALS — BP 118/56 | HR 73 | Temp 98.8°F | Ht 62.0 in | Wt 245.2 lb

## 2023-10-25 DIAGNOSIS — I10 Essential (primary) hypertension: Secondary | ICD-10-CM

## 2023-10-25 DIAGNOSIS — I5042 Chronic combined systolic (congestive) and diastolic (congestive) heart failure: Secondary | ICD-10-CM

## 2023-10-25 DIAGNOSIS — Z1321 Encounter for screening for nutritional disorder: Secondary | ICD-10-CM

## 2023-10-25 DIAGNOSIS — Z23 Encounter for immunization: Secondary | ICD-10-CM

## 2023-10-25 DIAGNOSIS — Z8249 Family history of ischemic heart disease and other diseases of the circulatory system: Secondary | ICD-10-CM | POA: Diagnosis not present

## 2023-10-25 DIAGNOSIS — Z7984 Long term (current) use of oral hypoglycemic drugs: Secondary | ICD-10-CM

## 2023-10-25 DIAGNOSIS — Z79899 Other long term (current) drug therapy: Secondary | ICD-10-CM | POA: Diagnosis not present

## 2023-10-25 DIAGNOSIS — I110 Hypertensive heart disease with heart failure: Secondary | ICD-10-CM

## 2023-10-25 DIAGNOSIS — Z87891 Personal history of nicotine dependence: Secondary | ICD-10-CM

## 2023-10-25 DIAGNOSIS — Z833 Family history of diabetes mellitus: Secondary | ICD-10-CM | POA: Diagnosis not present

## 2023-10-25 DIAGNOSIS — E118 Type 2 diabetes mellitus with unspecified complications: Secondary | ICD-10-CM

## 2023-10-25 LAB — POCT GLYCOSYLATED HEMOGLOBIN (HGB A1C): HbA1c, POC (controlled diabetic range): 6.4 % (ref 0.0–7.0)

## 2023-10-25 NOTE — Assessment & Plan Note (Signed)
 Status: Controlled. BP today 118/56. Reports taking losartan  50 mg, HCTZ 12.5 mg, Coreg  12.5 mg twice daily.  Last BMP 06/2023 was WNL. Plan -Continue: Losartan -HCTZ-carvedilol 

## 2023-10-25 NOTE — Assessment & Plan Note (Signed)
 Status: Controlled. Last A1c of 7.3 in 06/2023.  A1c today is 6.4.  Currently taking metformin  500 mg twice daily.  Declined taking Jardiance , after extensive discussion regarding benefits of SGLT 2 with her chronic conditions, still declining it.  Previously prescribed GLP-1 which she did not start, declines starting that as well.  Plan -Continue metformin  -Check vitamin B12 in setting of metformin  use -A1c in 6 months -Ophthalmology exam: reported 07/2023 [facility]  -Urine ACR completed 07/2023, elevated, on ARB -LDL 80 on 06/2023, taking rosuvastatin  20 mg

## 2023-10-25 NOTE — Assessment & Plan Note (Addendum)
 Chronic. HFimpEF, recent EF 55%. GDMT: Losartan  50 mg, carvedilol  12.5 mg. Not on MRA and declined SGLT2. BP normotensive. Weight: decreased from 253 lbs to 245 lbs today. No acute concerns, denies SOB, chest pain, or leg swelling. No changes today.

## 2023-10-25 NOTE — Patient Instructions (Addendum)
 Thank you, [name] for allowing us  to provide your care today. Today we discussed:  -A1c 6.4 today. Doing great! Continue metformin . Can think about Jardiance  and let us  know if you want to start it.  -Blood pressure under control. No changes  -Received flu shot.  -Will check vitamin B12 today.   Follow up: 4-6 months   Should you have any questions or concerns please call the internal medicine clinic at [phone].    Ninoshka Wainwright, D.O. [facility]

## 2023-10-25 NOTE — Progress Notes (Signed)
 CC: T2DM follow-up  HPI: Caroline [name] is a 78 y.o. female living with a history stated below and presents today for T2DM follow-up. Please see problem based assessment and plan for additional details.  Past Medical History:  Diagnosis Date   Anticoagulated on warfarin    Arthritis    Asthma    Bronchitis    CHF (congestive heart failure) (HCC)    Diabetes (HCC) 2019   Diverticulitis    DVT, lower extremity, recurrent (HCC)    Patient had unprovoked PE on 2002 and DVT in right lower extremety 2008.   Elevated INR    Frequent urination 03/21/2019   GERD (gastroesophageal reflux disease)    Hematuria, microscopic 09/13/2017   History of Clostridium difficile colitis 12/31/2016   Admitted 12/31/16 with C.diff colitis following several rounds of PO Clindamycin  for cellulitis. Responded well to PO Vancomycin .     History of kidney stones    Hx of bee sting allergy 10/31/2017   Hypertension    Hypokalemia 05/01/2017   Lipodermatosclerosis 10/11/2016   Lower abdominal pain 02/20/2018   Orthostatic hypotension    PE (pulmonary embolism)    Patient had unprovoked PE on 2002   PONV (postoperative nausea and vomiting)    Sinus infection 10/23/2021   Vertigo     Current Outpatient Medications on File Prior to Visit  Medication Sig Dispense Refill   acetaminophen  (TYLENOL ) 500 MG tablet Take 1 tablet (500 mg total) by mouth 4 (four) times daily as needed for mild pain (pain score 1-3) or headache. 90 tablet 3   albuterol  (VENTOLIN  HFA) 108 (90 Base) MCG/ACT inhaler TAKE 2 PUFFS BY MOUTH EVERY 6 HOURS AS NEEDED FOR WHEEZE OR SHORTNESS OF BREATH 8.5 each 2   budesonide -formoterol  (SYMBICORT ) 160-4.5 MCG/ACT inhaler Inhale 2 puffs into the lungs 2 (two) times daily. 1 each 11   carvedilol  (COREG ) 12.5 MG tablet Take 1 tablet (12.5 mg total) by mouth 2 (two) times daily with a meal. 180 tablet 3   diclofenac  Sodium (VOLTAREN ) 1 % GEL Apply 4 g topically 4 (four) times daily. 100 g 3    empagliflozin  (JARDIANCE ) 10 MG TABS tablet Take 1 tablet (10 mg total) by mouth daily. 90 tablet 3   fluticasone  (FLONASE ) 50 MCG/ACT nasal spray Place 1 spray into both nostrils daily. 48 mL 1   hydrochlorothiazide  (HYDRODIURIL ) 12.5 MG tablet Take 1 tablet (12.5 mg total) by mouth daily. 90 tablet 3   hydrocortisone  (ANUSOL -HC) 2.5 % rectal cream Place rectally 2 (two) times daily. 30 g 3   losartan  (COZAAR ) 50 MG tablet Take 1 tablet (50 mg total) by mouth daily. 90 tablet 3   meclizine  (ANTIVERT ) 25 MG tablet Take 1 tablet (25 mg total) by mouth 3 (three) times daily as needed. 90 tablet 5   metFORMIN  (GLUCOPHAGE -XR) 500 MG 24 hr tablet Take 1 tablet (500 mg total) by mouth 2 (two) times daily with a meal. 180 tablet 1   montelukast  (SINGULAIR ) 10 MG tablet TAKE 1 TABLET BY MOUTH EVERYDAY AT BEDTIME 90 tablet 3   pantoprazole  (PROTONIX ) 20 MG tablet Take 1 tablet (20 mg total) by mouth daily. 90 tablet 3   Psyllium (METAMUCIL PO) Take by mouth at bedtime. gummies     RESTASIS  0.05 % ophthalmic emulsion INSTILL 1 DROP INTO BOTH EYES TWICE A DAY 60 mL 1   rivaroxaban  (XARELTO ) 10 MG TABS tablet Take 1 tablet (10 mg total) by mouth daily. 90 tablet 3   rosuvastatin  (  CRESTOR ) 20 MG tablet Take 1 tablet (20 mg total) by mouth daily. 90 tablet 3   No current facility-administered medications on file prior to visit.    Family History  Problem Relation Age of Onset   Cancer Mother    Hypertension Sister    Diabetes Sister    Hypertension Brother    Diabetes Brother    Hypertension Sister    Diabetes Sister    Colon cancer Other    Heart Problems Other 34       sister's child, open heart surgery   CVA Father    Diabetes Son    Hypertension Son    Kidney disease Son        on dialysis   Hypertension Sister    Diabetes Sister    Hypertension Sister    Diabetes Sister    Cancer Brother    Hypertension Son    Diabetes Son    Hypertension Daughter    Schizophrenia Daughter      Social History   Socioeconomic History   Marital status: Divorced    Spouse name: Not on file   Number of children: 5   Years of education: 9 th grade   Highest education level: 9th grade  Occupational History   Occupation: retired  Tobacco Use   Smoking status: Former    Current packs/day: 0.00    Types: Cigarettes    Quit date: 09/18/1988    Years since quitting: 35.1   Smokeless tobacco: Never   Tobacco comments:    6 pack year smoking history as a teen  Vaping Use   Vaping status: Never Used  Substance and Sexual Activity   Alcohol use: No   Drug use: No   Sexual activity: Not Currently    Birth control/protection: None  Other Topics Concern   Not on file  Social History Narrative   Current Social History 08/28/2019   Patient lives with 33 yo granddaughter in a one story home. There are not steps up to the entrance the patient uses.       Patient's method of transportation is church member.      The highest level of education was 9th grade      The patient currently retired.      Identified important Relationships are God, family       Pets : 1 lab/pitt mix named Cinnamon       Interests / Fun: Church,TV       Current Stressors: Me and my granddaughter       Religious / Personal Beliefs: I'm Holiness and I love people       Other: I'd give away my last dime.       FREDRIK Alcide, BSN, RN-BC    Social Drivers of Health   Financial Resource Strain: Low Risk  (08/09/2023)   Overall Financial Resource Strain (CARDIA)    Difficulty of Paying Living Expenses: Not very hard  Food Insecurity: No Food Insecurity (08/09/2023)   Hunger Vital Sign    Worried About Running Out of Food in the Last Year: Never true    Ran Out of Food in the Last Year: Never true  Transportation Needs: No Transportation Needs (08/09/2023)   PRAPARE - Administrator, Civil Service (Medical): No    Lack of Transportation (Non-Medical): No  Physical Activity: Inactive  (08/09/2023)   Exercise Vital Sign    Days of Exercise per Week: 0 days    Minutes of  Exercise per Session: 0 min  Stress: No Stress Concern Present (08/09/2023)   [employer] of Occupational Health - Occupational Stress Questionnaire    Feeling of Stress: Not at all  Social Connections: Moderately Isolated (08/09/2023)   Social Connection and Isolation Panel    Frequency of Communication with Friends and Family: More than three times a week    Frequency of Social Gatherings with Friends and Family: Twice a week    Attends Religious Services: 1 to 4 times per year    Active Member of [employer] or Organizations: No    Attends Banker Meetings: Never    Marital Status: Divorced  Catering manager Violence: Not At Risk (08/09/2023)   Humiliation, Afraid, Rape, and Kick questionnaire    Fear of Current or Ex-Partner: No    Emotionally Abused: No    Physically Abused: No    Sexually Abused: No    Review of Systems: ROS negative except for what is noted on the assessment and plan.  Vitals:   10/25/23 0953  BP: (!) 118/56  Pulse: 73  Temp: 98.8 F (37.1 C)  TempSrc: Oral  SpO2: 99%  Weight: 245 lb 3.2 oz (111.2 kg)  Height: 5' 2 (1.575 m)   Physical Exam: Constitutional: well-appearing female sitting in chair comfortably, in no acute distress Cardiovascular: regular rate and rhythm Pulmonary/Chest: normal work of breathing on room air, lungs clear to auscultation bilaterally Neurological: alert & oriented x 3  Assessment & Plan:   Assessment & Plan Type 2 diabetes mellitus with complication, without long-term current use of insulin (HCC) Status: Controlled. Last A1c of 7.3 in 06/2023.  A1c today is 6.4.  Currently taking metformin  500 mg twice daily.  Declined taking Jardiance , after extensive discussion regarding benefits of SGLT 2 with her chronic conditions, still declining it.  Previously prescribed GLP-1 which she did not start, declines starting that as  well.  Plan -Continue metformin  -Check vitamin B12 in setting of metformin  use -A1c in 6 months -Ophthalmology exam: reported 07/2023 [facility]  -Urine ACR completed 07/2023, elevated, on ARB -LDL 80 on 06/2023, taking rosuvastatin  20 mg Encounter for vitamin deficiency screening Vitamin B12 screening today with chronic metformin  use. Essential hypertension Status: Controlled. BP today 118/56. Reports taking losartan  50 mg, HCTZ 12.5 mg, Coreg  12.5 mg twice daily.  Last BMP 06/2023 was WNL. Plan -Continue: Losartan -HCTZ-carvedilol  Chronic combined systolic and diastolic CHF, NYHA class 1 (HCC) Chronic. HFimpEF, recent EF 55%. GDMT: Losartan  50 mg, carvedilol  12.5 mg. Not on MRA and declined SGLT2. BP normotensive. Weight: decreased from 253 lbs to 245 lbs today. No acute concerns, denies SOB, chest pain, or leg swelling. No changes today.  Encounter for immunization Received flu shot today.  Orders Placed This Encounter  Procedures   Vitamin B12   POC Hbg A1C   Return in about 6 months (around 04/24/2024) for diabetes .   Patient discussed with Dr. Francesco Ozell Nearing, D.O. [facility] Internal Medicine, PGY-3 Phone: [phone] Date 10/25/2023 Time 11:52 AMReported 07/2023 reported 07/2023

## 2023-10-26 ENCOUNTER — Ambulatory Visit: Payer: Self-pay | Admitting: Student

## 2023-10-26 LAB — VITAMIN B12: Vitamin B-12: 352 pg/mL (ref 232–1245)

## 2023-10-26 NOTE — Progress Notes (Signed)
 Internal Medicine Clinic Attending  Case discussed with the resident at the time of the visit.  We reviewed the resident's history and exam and pertinent patient test results.  I agree with the assessment, diagnosis, and plan of care documented in the resident's note.

## 2023-10-27 MED ORDER — VITAMIN B-12 1000 MCG PO TABS: 1000.0000 ug | ORAL_TABLET | Freq: Every day | ORAL | 3 refills | Status: AC

## 2023-10-30 LAB — GLUCOSE, CAPILLARY: Glucose-Capillary: 117 mg/dL — ABNORMAL HIGH (ref 70–99)

## 2023-11-02 ENCOUNTER — Telehealth: Payer: Self-pay

## 2023-11-02 NOTE — Telephone Encounter (Signed)
 [facility] stated Vit B12 is OTC med; her insurance will not pay. I called pt to let her know. She asked about the eye drops Miebo; stated she had this med before from her eye doctor- informed pt to call their office.

## 2023-11-02 NOTE — Telephone Encounter (Signed)
 Copied from CRM [id number]. Topic: Clinical - Prescription Issue >> Nov 02, 2023 10:10 AM [name] wrote: Reason for CRM: Caroline [name] [id number] [facility] in to state that she went this morning to pick up her prescription for medications that Dr. Elicia informed her she needed to be on. When she go to the pharmacy neither of the prescriptions were available. I was able to locate one that shows it was sent on the 17th of October cyanocobalamin (VITAMIN B12) 1000 MCG tablet 90 tablet 3 10/27/2023 -  Sig - Route: Take 1 tablet (1,000 mcg total) by mouth daily. - Oral  Sent to pharmacy as: cyanocobalamin (VITAMIN B12) 1000 MCG tablet  E-Prescribing Status: Receipt confirmed by pharmacy (10/27/2023  4:24 PM ED however its not at the pharmacy, also she stated that Dr. Elicia also spoke to her about sending a prescription eye drop called  Miebo (Perflurohexyloctane) but there's no prescription for the listed. Please call patient to advise.

## 2023-11-15 ENCOUNTER — Other Ambulatory Visit: Payer: Self-pay

## 2023-11-15 ENCOUNTER — Ambulatory Visit

## 2023-11-15 DIAGNOSIS — Z1231 Encounter for screening mammogram for malignant neoplasm of breast: Secondary | ICD-10-CM

## 2023-12-05 ENCOUNTER — Other Ambulatory Visit: Payer: Self-pay

## 2023-12-05 ENCOUNTER — Ambulatory Visit: Admission: RE | Admit: 2023-12-05 | Discharge: 2023-12-05 | Disposition: A | Source: Ambulatory Visit

## 2023-12-05 DIAGNOSIS — Z1231 Encounter for screening mammogram for malignant neoplasm of breast: Secondary | ICD-10-CM

## 2023-12-05 DIAGNOSIS — R921 Mammographic calcification found on diagnostic imaging of breast: Secondary | ICD-10-CM

## 2023-12-25 ENCOUNTER — Other Ambulatory Visit: Payer: Self-pay

## 2023-12-25 ENCOUNTER — Telehealth: Payer: Self-pay | Admitting: *Deleted

## 2023-12-25 DIAGNOSIS — R42 Dizziness and giddiness: Secondary | ICD-10-CM

## 2023-12-25 DIAGNOSIS — E118 Type 2 diabetes mellitus with unspecified complications: Secondary | ICD-10-CM

## 2023-12-25 MED ORDER — MECLIZINE HCL 25 MG PO TABS: 25.0000 mg | ORAL_TABLET | Freq: Every day | ORAL | 0 refills | Status: DC | PRN

## 2023-12-25 MED ORDER — METFORMIN HCL ER 500 MG PO TB24: 500.0000 mg | ORAL_TABLET | Freq: Two times a day (BID) | ORAL | 1 refills | Status: AC

## 2023-12-25 NOTE — Telephone Encounter (Signed)
 Copied from CRM #[id number]. Topic: Clinical - Medication Refill >> Dec 25, 2023  9:24 AM Diannia H wrote: Medication: meclizine  (ANTIVERT ) 25 MG tablet metFORMIN  (GLUCOPHAGE -XR) 500 MG 24 hr tablet  Has the patient contacted their pharmacy? Yes (Agent: If no, request that the patient contact the pharmacy for the refill. If patient does not wish to contact the pharmacy document the reason why and proceed with request.) (Agent: If yes, when and what did the pharmacy advise?)  This is the patient's preferred pharmacy:  [id number] [address], [place] - [address] [place] [address] Phone: [phone] Fax: [phone]  Is this the correct pharmacy for this prescription? Yes If no, delete pharmacy and type the correct one.   Has the prescription been filled recently? No  Is the patient out of the medication? Yes  Has the patient been seen for an appointment in the last year OR does the patient have an upcoming appointment? Yes  Can we respond through MyChart? Yes  Agent: Please be advised that Rx refills may take up to 3 business days. We ask that you follow-up with your pharmacy.

## 2024-01-19 ENCOUNTER — Other Ambulatory Visit: Payer: Self-pay

## 2024-01-19 DIAGNOSIS — J4530 Mild persistent asthma, uncomplicated: Secondary | ICD-10-CM

## 2024-01-19 DIAGNOSIS — R42 Dizziness and giddiness: Secondary | ICD-10-CM

## 2024-01-19 NOTE — Telephone Encounter (Signed)
 Copied from CRM #[id number]. Topic: Clinical - Medication Refill >> Jan 19, 2024  3:56 PM Debby BROCKS wrote: Medication: meclizine  (ANTIVERT ) 25 MG table albuterol  (VENTOLIN  HFA) 108 (90 Base) MCG/ACT inhaler (For the albuterol  patient would prefer the one that comes in a red and white box as the blue and white box makes her feel bad)  Has the patient contacted their pharmacy? Yes (Agent: If no, request that the patient contact the pharmacy for the refill. If patient does not wish to contact the pharmacy document the reason why and proceed with request.) (Agent: If yes, when and what did the pharmacy advise?)  This is the patient's preferred pharmacy:  [id number] [address], [place] - [address] [place] [address] Phone: [phone] Fax: [phone]  [facility] [address] [place] [address] Phone: [phone] Fax: [phone]  Is this the correct pharmacy for this prescription? Yes If no, delete pharmacy and type the correct one.   Has the prescription been filled recently? No  Is the patient out of the medication? Yes  Has the patient been seen for an appointment in the last year OR does the patient have an upcoming appointment? Yes  Can we respond through MyChart? Yes  Agent: Please be advised that Rx refills may take up to 3 business days. We ask that you follow-up with your pharmacy.

## 2024-01-19 NOTE — Telephone Encounter (Signed)
 Copied from CRM #[id number]. Topic: Clinical - Medication Refill >> Jan 19, 2024  4:01 PM Debby BROCKS wrote: Medication: meclizine  (ANTIVERT ) 25 MG table albuterol  (VENTOLIN  HFA) 108 (90 Base) MCG/ACT inhaler (For the albuterol  patient would prefer the one that comes in a red and white box as the blue and white box makes her feel bad)   Has the patient contacted their pharmacy? Yes (Agent: If no, request that the patient contact the pharmacy for the refill. If patient does not wish to contact the pharmacy document the reason why and proceed with request.) (Agent: If yes, when and what did the pharmacy advise?)  This is the patient's preferred pharmacy:  [id number] [address], [place] - [address] [place] [address] Phone: [phone] Fax: [phone]  Is this the correct pharmacy for this prescription? Yes If no, delete pharmacy and type the correct one.   Has the prescription been filled recently? No  Is the patient out of the medication? Yes  Has the patient been seen for an appointment in the last year OR does the patient have an upcoming appointment? Yes  Can we respond through MyChart? Yes  Agent: Please be advised that Rx refills may take up to 3 business days. We ask that you follow-up with your pharmacy.

## 2024-01-22 NOTE — Telephone Encounter (Signed)
 Patient is scheduled to come in tomorrow morning to see Dr.Bender.

## 2024-01-23 ENCOUNTER — Ambulatory Visit: Admitting: Student

## 2024-01-23 VITALS — BP 132/52 | HR 67 | Temp 98.2°F | Ht 62.0 in | Wt 242.8 lb

## 2024-01-23 DIAGNOSIS — J4530 Mild persistent asthma, uncomplicated: Secondary | ICD-10-CM

## 2024-01-23 DIAGNOSIS — Z7984 Long term (current) use of oral hypoglycemic drugs: Secondary | ICD-10-CM | POA: Diagnosis not present

## 2024-01-23 DIAGNOSIS — R42 Dizziness and giddiness: Secondary | ICD-10-CM

## 2024-01-23 DIAGNOSIS — J301 Allergic rhinitis due to pollen: Secondary | ICD-10-CM

## 2024-01-23 DIAGNOSIS — I10 Essential (primary) hypertension: Secondary | ICD-10-CM | POA: Diagnosis not present

## 2024-01-23 DIAGNOSIS — E118 Type 2 diabetes mellitus with unspecified complications: Secondary | ICD-10-CM

## 2024-01-23 LAB — POCT GLYCOSYLATED HEMOGLOBIN (HGB A1C): HbA1c, POC (controlled diabetic range): 5.9 % (ref 0.0–7.0)

## 2024-01-23 LAB — GLUCOSE, CAPILLARY: Glucose-Capillary: 126 mg/dL — ABNORMAL HIGH (ref 70–99)

## 2024-01-23 MED ORDER — MECLIZINE HCL 25 MG PO TABS: ORAL_TABLET | ORAL | 0 refills | Status: AC

## 2024-01-23 MED ORDER — LORATADINE 10 MG PO TABS: 10.0000 mg | ORAL_TABLET | Freq: Every day | ORAL | 3 refills | Status: AC

## 2024-01-23 MED ORDER — ALBUTEROL SULFATE HFA 108 (90 BASE) MCG/ACT IN AERS: INHALATION_SPRAY | RESPIRATORY_TRACT | 2 refills | Status: AC

## 2024-01-23 NOTE — Progress Notes (Signed)
 "  CC: Routine Follow Up for management of chronic medical conditions after last office visit 10/25/2023  HPI:  Caroline [name] is a 79 y.o. female with pertinent PMH of hypertension, CHF, asthma, type 2 diabetes with microalbuminuria, hyperlipidemia, obesity, and depression who presents as above. Please see assessment and plan below for further details.  Medications: Current Outpatient Medications  Medication Instructions   acetaminophen  (TYLENOL ) 500 mg, Oral, 4 times daily PRN   albuterol  (VENTOLIN  HFA) 108 (90 Base) MCG/ACT inhaler TAKE 2 PUFFS BY MOUTH EVERY 6 HOURS AS NEEDED FOR WHEEZE OR SHORTNESS OF BREATH   budesonide -formoterol  (SYMBICORT ) 160-4.5 MCG/ACT inhaler 2 puffs, Inhalation, 2 times daily   carvedilol  (COREG ) 12.5 mg, Oral, 2 times daily with meals   cyanocobalamin (VITAMIN B12) 1,000 mcg, Oral, Daily   diclofenac  Sodium (VOLTAREN ) 4 g, Topical, 4 times daily   empagliflozin  (JARDIANCE ) 10 mg, Oral, Daily   fluticasone  (FLONASE ) 50 MCG/ACT nasal spray 1 spray, Each Nare, Daily   hydrochlorothiazide  (HYDRODIURIL ) 12.5 mg, Oral, Daily   hydrocortisone  (ANUSOL -HC) 2.5 % rectal cream Rectal, 2 times daily   loratadine  (CLARITIN ) 10 mg, Oral, Daily   losartan  (COZAAR ) 50 mg, Oral, Daily   meclizine  (ANTIVERT ) 25 MG tablet Take 1 tablet (25 mg total) by mouth 2 (two) times daily for 7 days, THEN 1 tablet (25 mg total) daily for 7 days, THEN 1 tablet (25 mg total) 2 (two) times daily as needed for dizziness.   metFORMIN  (GLUCOPHAGE -XR) 500 mg, Oral, 2 times daily with meals   montelukast  (SINGULAIR ) 10 MG tablet TAKE 1 TABLET BY MOUTH EVERYDAY AT BEDTIME   pantoprazole  (PROTONIX ) 20 mg, Oral, Daily   Psyllium (METAMUCIL PO) Oral, Daily at bedtime, gummies    RESTASIS  0.05 % ophthalmic emulsion INSTILL 1 DROP INTO BOTH EYES TWICE A DAY   rivaroxaban  (XARELTO ) 10 mg, Oral, Daily   rosuvastatin  (CRESTOR ) 20 mg, Oral, Daily     Review of Systems:   Pertinent items noted in HPI  and/or A&P.  Physical Exam:  Vitals:   01/23/24 1003  BP: (!) 132/52  Pulse: 67  Temp: 98.2 F (36.8 C)  TempSrc: Oral  SpO2: 100%  Weight: 242 lb 12.8 oz (110.1 kg)  Height: 5' 2 (1.575 m)    Constitutional: Well-appearing elderly female. In no acute distress. HEENT: Normocephalic, atraumatic, Sclera non-icteric, PERRL, EOM intact Cardio:Regular rate and rhythm. 2+ bilateral radial pulses. Pulm:Clear to auscultation bilaterally. Normal work of breathing on room air. FDX:Wzhjupcz for extremity edema. Skin:Warm and dry. Neuro:Alert and oriented x3. No focal deficit noted. Psych:Pleasant mood and affect.   Assessment & Plan:   Assessment & Plan Type 2 diabetes mellitus with complication, without long-term current use of insulin (HCC) A1c 5.9 down from 6.4.  She continues on metformin  500 mg twice daily and has not started taking the Jardiance .  She is worried about some side effects which we discussed.  She we will consider starting this medicine at home and will call our office if she does. - Continue metformin  500 mg twice daily - Repeat A1c in 3 months Vertigo Previous history of vertigo with no recent episodes but she has been taking meclizine  25 mg 3 times daily chronically.  She has been on this medicine for over 7 years.  Discussed the long-term side effects especially in older adults.  She is agreeable to taper off of this medicine and then use it as needed.  If symptoms return I recommend that she comes back in for  reevaluation or is sent to ENT and vestibular PT. - Taper meclizine  from 25 mg 3 times daily to 25 mg twice daily for 7 days then 25 mg daily for 7 days - After that meclizine  25 mg twice daily as needed for vertigo Essential hypertension Blood pressure continues to be stable with a low diastolic.  Blood pressure today 132/52.  Currently on hydrochlorothiazide , carvedilol , and losartan .  She does have some moderate microalbuminuria and if she does not want to  take the Jardiance  we could discontinue the hydrochlorothiazide  in favor of increasing the losartan . - Continue losartan  50 mg daily, carvedilol  12.5 mg twice daily and hydrochlorothiazide  12.5 mg daily - Metabolic panel today  Orders Placed This Encounter  Procedures   Glucose, capillary   Basic metabolic panel with GFR   POC Hbg A1C     Return in about 3 months (around 04/22/2024) for HTN, T2DM.   Patient discussed with Dr. Mliss Foot  Fairy Pool, DO [facility] Internal [facility] Resident PGY-3 Clinic Phone: [phone] Please contact the on call pager at [phone] for any urgent or emergent needs. "

## 2024-01-23 NOTE — Assessment & Plan Note (Signed)
 Previous history of vertigo with no recent episodes but she has been taking meclizine  25 mg 3 times daily chronically.  She has been on this medicine for over 7 years.  Discussed the long-term side effects especially in older adults.  She is agreeable to taper off of this medicine and then use it as needed.  If symptoms return I recommend that she comes back in for reevaluation or is sent to ENT and vestibular PT. - Taper meclizine  from 25 mg 3 times daily to 25 mg twice daily for 7 days then 25 mg daily for 7 days - After that meclizine  25 mg twice daily as needed for vertigo

## 2024-01-23 NOTE — Patient Instructions (Signed)
 Thank you, [name], for allowing us  to provide your care today. Today we discussed . . .  > Diabetes       - You are doing a fantastic job controlling your diabetes.  Your A1c today is 5.9.  Please continue to take your metformin  and to think about the Jardiance .  The Jardiance  can help with your blood sugars as well as protect your kidneys and decrease the amount of protein in your urine.  If you do start taking this medicine please let us  know.  We will see you back in 3 months to repeat your A1c. > Vertigo       - I would like you to start taking your meclizine  only as needed.  However since you have been using it 3 times a day I would like you to start using it twice a day for a week and then once a day for a week before starting to use it only as needed.  If you have any return of your symptoms please let us  know and you may need further evaluation at the ear nose and throat doctor and we may send you to vestibular physical therapy.   I have ordered the following labs for you:   Lab Orders         Glucose, capillary         Basic metabolic panel with GFR         POC Hbg A1C        Follow up: 3 months    Remember:  Should you have any questions or concerns please call the internal medicine clinic at [phone].     Fairy Pool, DO [facility]

## 2024-01-23 NOTE — Assessment & Plan Note (Signed)
 A1c 5.9 down from 6.4.  She continues on metformin  500 mg twice daily and has not started taking the Jardiance .  She is worried about some side effects which we discussed.  She we will consider starting this medicine at home and will call our office if she does. - Continue metformin  500 mg twice daily - Repeat A1c in 3 months

## 2024-01-23 NOTE — Assessment & Plan Note (Signed)
 Blood pressure continues to be stable with a low diastolic.  Blood pressure today 132/52.  Currently on hydrochlorothiazide , carvedilol , and losartan .  She does have some moderate microalbuminuria and if she does not want to take the Jardiance  we could discontinue the hydrochlorothiazide  in favor of increasing the losartan . - Continue losartan  50 mg daily, carvedilol  12.5 mg twice daily and hydrochlorothiazide  12.5 mg daily - Metabolic panel today

## 2024-01-24 LAB — BASIC METABOLIC PANEL WITH GFR
BUN/Creatinine Ratio: 18 (ref 12–28)
BUN: 14 mg/dL (ref 8–27)
CO2: 27 mmol/L (ref 20–29)
Calcium: 10 mg/dL (ref 8.7–10.3)
Chloride: 102 mmol/L (ref 96–106)
Creatinine, Ser: 0.78 mg/dL (ref 0.57–1.00)
Glucose: 118 mg/dL — ABNORMAL HIGH (ref 70–99)
Potassium: 3.8 mmol/L (ref 3.5–5.2)
Sodium: 144 mmol/L (ref 134–144)
eGFR: 79 mL/min/1.73

## 2024-01-24 NOTE — Progress Notes (Signed)
 Internal Medicine Clinic Attending  Case discussed with the resident at the time of the visit.  We reviewed the resident's history and exam and pertinent patient test results.  I agree with the assessment, diagnosis, and plan of care documented in the resident's note.

## 2024-01-29 ENCOUNTER — Telehealth: Payer: Self-pay | Admitting: *Deleted

## 2024-01-29 NOTE — Telephone Encounter (Signed)
 Spoke with patient regarding her mammogram/ last mammogram 2024/ patient states she will call land set that up in June 2026,that is when she say breast center told her to call and set the appointment.

## 2024-01-30 ENCOUNTER — Telehealth: Payer: Self-pay | Admitting: *Deleted

## 2024-01-30 NOTE — Telephone Encounter (Signed)
 Mammogram appointment 02-16-2024 @ 11:40 pm to arrive 11:25 pm at the breast center, patient was contacted with this information / appointment mailed to the patient attached is the information regarding the 75.00 no show fee.

## 2024-02-16 ENCOUNTER — Encounter

## 2024-03-01 ENCOUNTER — Encounter

## 2024-04-24 ENCOUNTER — Ambulatory Visit: Payer: Self-pay

## 2024-08-14 ENCOUNTER — Ambulatory Visit
